# Patient Record
Sex: Male | Born: 1945 | Race: White | Hispanic: No | Marital: Married | Smoking: Never smoker
Health system: Southern US, Community
[De-identification: ages and names within clinical notes are randomized; demographics above are authoritative.]

## PROBLEM LIST (undated history)

## (undated) ENCOUNTER — Emergency Department (HOSPITAL_BASED_OUTPATIENT_CLINIC_OR_DEPARTMENT_OTHER): Admission: EM | Payer: Medicare Other | Source: Home / Self Care

## (undated) DIAGNOSIS — M1990 Unspecified osteoarthritis, unspecified site: Secondary | ICD-10-CM

## (undated) DIAGNOSIS — I219 Acute myocardial infarction, unspecified: Secondary | ICD-10-CM

## (undated) DIAGNOSIS — I2510 Atherosclerotic heart disease of native coronary artery without angina pectoris: Secondary | ICD-10-CM

## (undated) DIAGNOSIS — G40409 Other generalized epilepsy and epileptic syndromes, not intractable, without status epilepticus: Secondary | ICD-10-CM

## (undated) DIAGNOSIS — I4891 Unspecified atrial fibrillation: Secondary | ICD-10-CM

## (undated) DIAGNOSIS — K625 Hemorrhage of anus and rectum: Secondary | ICD-10-CM

## (undated) DIAGNOSIS — C218 Malignant neoplasm of overlapping sites of rectum, anus and anal canal: Secondary | ICD-10-CM

## (undated) DIAGNOSIS — K645 Perianal venous thrombosis: Secondary | ICD-10-CM

## (undated) DIAGNOSIS — N529 Male erectile dysfunction, unspecified: Secondary | ICD-10-CM

## (undated) DIAGNOSIS — I509 Heart failure, unspecified: Secondary | ICD-10-CM

## (undated) DIAGNOSIS — C801 Malignant (primary) neoplasm, unspecified: Secondary | ICD-10-CM

## (undated) DIAGNOSIS — Z86010 Personal history of colonic polyps: Secondary | ICD-10-CM

## (undated) DIAGNOSIS — N200 Calculus of kidney: Secondary | ICD-10-CM

## (undated) DIAGNOSIS — Z87442 Personal history of urinary calculi: Secondary | ICD-10-CM

## (undated) DIAGNOSIS — I82409 Acute embolism and thrombosis of unspecified deep veins of unspecified lower extremity: Secondary | ICD-10-CM

## (undated) DIAGNOSIS — E039 Hypothyroidism, unspecified: Secondary | ICD-10-CM

## (undated) DIAGNOSIS — K6289 Other specified diseases of anus and rectum: Secondary | ICD-10-CM

## (undated) DIAGNOSIS — T7840XA Allergy, unspecified, initial encounter: Secondary | ICD-10-CM

## (undated) DIAGNOSIS — Z951 Presence of aortocoronary bypass graft: Secondary | ICD-10-CM

## (undated) DIAGNOSIS — R011 Cardiac murmur, unspecified: Secondary | ICD-10-CM

## (undated) DIAGNOSIS — I209 Angina pectoris, unspecified: Secondary | ICD-10-CM

## (undated) DIAGNOSIS — G40309 Generalized idiopathic epilepsy and epileptic syndromes, not intractable, without status epilepticus: Secondary | ICD-10-CM

## (undated) DIAGNOSIS — K921 Melena: Secondary | ICD-10-CM

## (undated) DIAGNOSIS — I10 Essential (primary) hypertension: Secondary | ICD-10-CM

## (undated) DIAGNOSIS — R569 Unspecified convulsions: Secondary | ICD-10-CM

## (undated) DIAGNOSIS — E7800 Pure hypercholesterolemia, unspecified: Secondary | ICD-10-CM

## (undated) DIAGNOSIS — R0602 Shortness of breath: Secondary | ICD-10-CM

## (undated) DIAGNOSIS — J189 Pneumonia, unspecified organism: Secondary | ICD-10-CM

## (undated) DIAGNOSIS — C211 Malignant neoplasm of anal canal: Secondary | ICD-10-CM

## (undated) DIAGNOSIS — Z952 Presence of prosthetic heart valve: Secondary | ICD-10-CM

## (undated) DIAGNOSIS — I350 Nonrheumatic aortic (valve) stenosis: Secondary | ICD-10-CM

## (undated) DIAGNOSIS — I2699 Other pulmonary embolism without acute cor pulmonale: Secondary | ICD-10-CM

## (undated) DIAGNOSIS — Z923 Personal history of irradiation: Secondary | ICD-10-CM

## (undated) HISTORY — DX: Hemorrhage of anus and rectum: K62.5

## (undated) HISTORY — DX: Unspecified convulsions: R56.9

## (undated) HISTORY — DX: Personal history of irradiation: Z92.3

## (undated) HISTORY — PX: ANAL FISSURE REPAIR: SHX2312

## (undated) HISTORY — DX: Perianal venous thrombosis: K64.5

## (undated) HISTORY — DX: Nonrheumatic aortic (valve) stenosis: I35.0

## (undated) HISTORY — DX: Malignant (primary) neoplasm, unspecified: C80.1

## (undated) HISTORY — PX: POLYPECTOMY: SHX149

## (undated) HISTORY — DX: Other generalized epilepsy and epileptic syndromes, not intractable, without status epilepticus: G40.409

## (undated) HISTORY — DX: Calculus of kidney: N20.0

## (undated) HISTORY — DX: Other specified diseases of anus and rectum: K62.89

## (undated) HISTORY — DX: Presence of prosthetic heart valve: Z95.2

## (undated) HISTORY — PX: COLONOSCOPY: SHX174

## (undated) HISTORY — DX: Male erectile dysfunction, unspecified: N52.9

## (undated) HISTORY — DX: Heart failure, unspecified: I50.9

## (undated) HISTORY — DX: Unspecified atrial fibrillation: I48.91

## (undated) HISTORY — DX: Other pulmonary embolism without acute cor pulmonale: I26.99

## (undated) HISTORY — DX: Acute myocardial infarction, unspecified: I21.9

## (undated) HISTORY — DX: Allergy, unspecified, initial encounter: T78.40XA

## (undated) HISTORY — DX: Generalized idiopathic epilepsy and epileptic syndromes, not intractable, without status epilepticus: G40.309

## (undated) HISTORY — DX: Melena: K92.1

## (undated) HISTORY — DX: Essential (primary) hypertension: I10

## (undated) HISTORY — DX: Acute embolism and thrombosis of unspecified deep veins of unspecified lower extremity: I82.409

## (undated) HISTORY — DX: Personal history of colonic polyps: Z86.010

## (undated) HISTORY — DX: Presence of aortocoronary bypass graft: Z95.1

## (undated) HISTORY — DX: Malignant neoplasm of anal canal: C21.1

## (undated) HISTORY — DX: Malignant neoplasm of overlapping sites of rectum, anus and anal canal: C21.8

## (undated) HISTORY — DX: Pure hypercholesterolemia, unspecified: E78.00

## (undated) HISTORY — PX: LITHOTRIPSY: SUR834

---

## 1993-09-04 HISTORY — PX: OTHER SURGICAL HISTORY: SHX169

## 1999-01-06 ENCOUNTER — Ambulatory Visit (HOSPITAL_COMMUNITY): Admission: RE | Admit: 1999-01-06 | Discharge: 1999-01-06 | Payer: Self-pay | Admitting: Sports Medicine

## 2002-01-01 ENCOUNTER — Encounter: Payer: Self-pay | Admitting: Sports Medicine

## 2002-01-01 ENCOUNTER — Encounter: Admission: RE | Admit: 2002-01-01 | Discharge: 2002-01-01 | Payer: Self-pay | Admitting: Sports Medicine

## 2008-03-19 ENCOUNTER — Encounter: Admission: RE | Admit: 2008-03-19 | Discharge: 2008-03-19 | Payer: Self-pay | Admitting: Family Medicine

## 2009-10-22 ENCOUNTER — Emergency Department (HOSPITAL_COMMUNITY): Admission: EM | Admit: 2009-10-22 | Discharge: 2009-10-22 | Payer: Self-pay | Admitting: Family Medicine

## 2009-12-31 ENCOUNTER — Ambulatory Visit (HOSPITAL_BASED_OUTPATIENT_CLINIC_OR_DEPARTMENT_OTHER): Admission: RE | Admit: 2009-12-31 | Discharge: 2009-12-31 | Payer: Self-pay | Admitting: Urology

## 2010-09-04 DIAGNOSIS — I219 Acute myocardial infarction, unspecified: Secondary | ICD-10-CM

## 2010-09-04 HISTORY — DX: Acute myocardial infarction, unspecified: I21.9

## 2010-11-22 LAB — POCT HEMOGLOBIN-HEMACUE: Hemoglobin: 15 g/dL (ref 13.0–17.0)

## 2010-11-24 LAB — POCT URINALYSIS DIP (DEVICE)
Bilirubin Urine: NEGATIVE
Glucose, UA: NEGATIVE mg/dL
Ketones, ur: NEGATIVE mg/dL
Nitrite: NEGATIVE
Protein, ur: NEGATIVE mg/dL
Specific Gravity, Urine: 1.025 (ref 1.005–1.030)
Urobilinogen, UA: 0.2 mg/dL (ref 0.0–1.0)
pH: 5 (ref 5.0–8.0)

## 2011-07-11 ENCOUNTER — Other Ambulatory Visit: Payer: Self-pay | Admitting: Family Medicine

## 2011-07-11 ENCOUNTER — Ambulatory Visit
Admission: RE | Admit: 2011-07-11 | Discharge: 2011-07-11 | Disposition: A | Discharge status: Home / Self Care | Payer: BC Managed Care – PPO | Source: Ambulatory Visit | Attending: Family Medicine | Admitting: Family Medicine

## 2011-07-11 DIAGNOSIS — R05 Cough: Secondary | ICD-10-CM

## 2011-07-25 ENCOUNTER — Encounter: Payer: Self-pay | Admitting: Thoracic Surgery (Cardiothoracic Vascular Surgery)

## 2011-07-25 DIAGNOSIS — E7800 Pure hypercholesterolemia, unspecified: Secondary | ICD-10-CM | POA: Insufficient documentation

## 2011-07-25 DIAGNOSIS — N529 Male erectile dysfunction, unspecified: Secondary | ICD-10-CM

## 2011-07-25 DIAGNOSIS — N200 Calculus of kidney: Secondary | ICD-10-CM | POA: Insufficient documentation

## 2011-07-31 ENCOUNTER — Encounter: Payer: Self-pay | Admitting: Thoracic Surgery (Cardiothoracic Vascular Surgery)

## 2011-07-31 ENCOUNTER — Institutional Professional Consult (permissible substitution) (INDEPENDENT_AMBULATORY_CARE_PROVIDER_SITE_OTHER): Payer: BC Managed Care – PPO | Admitting: Thoracic Surgery (Cardiothoracic Vascular Surgery)

## 2011-07-31 VITALS — BP 142/91 | HR 110 | Resp 20 | Ht 71.0 in | Wt 230.0 lb

## 2011-07-31 DIAGNOSIS — I359 Nonrheumatic aortic valve disorder, unspecified: Secondary | ICD-10-CM

## 2011-07-31 DIAGNOSIS — I350 Nonrheumatic aortic (valve) stenosis: Secondary | ICD-10-CM | POA: Insufficient documentation

## 2011-07-31 NOTE — Progress Notes (Signed)
[address]            [place] [address]          [phone]     CARDIOTHORACIC SURGERY CONSULTATION REPORT  PCP is [name], MD Referring Provider is Donato Schultz, MD  Chief Complaint  Patient presents with  . Aortic Stenosis    Referral from Dr Anne Fu for surgical eval on severe aortic stenosis, ECHO 07/13/11    HPI:  Patient is a 65 year old moderately obese male who has been remarkably healthy most of his life. He was told during his youth that he had a heart murmur but he never had any formal medical evaluation. He served in [employer] without any concerns and he is never been noted to have any issues until recently. The patient does admit to decreased energy and fatigue as well as exertional shortness of breath that is gradually developed over the last year. 3 weeks ago he was helping a friend with strenuous labor lifting somewhat. He somewhat suddenly developed discomfort across his chest associated with severe fatigue dizziness and nausea. He sat down quickly but then shortly thereafter completely passed out. He recovered quickly and declined going to the hospital for evaluation of time. Was seen by his primary care physician and an echocardiogram and stress test were performed. The echocardiogram revealed severe aortic stenosis with normal left ventricular systolic function by report. Nuclear stress test was performed demonstrating normal baseline left ventricular function with ejection fraction 53%. There was no evidence for ischemia and the exam was felt to be low risk. The patient has been referred to consider elective aortic valve replacement. Cardiac catheterization has not been performed.  Past Medical History  Diagnosis Date  . Elevated cholesterol   . ED (erectile dysfunction)   . Kidney stones   . Aortic stenosis     Past Surgical History  Procedure Date  . Lithotripsy 2001 AND 2002    DR PETERSON  . C4-6 fusion  1995    DR STERN    History reviewed. No pertinent family history.  Social History History  Substance Use Topics  . Smoking status: Never Smoker   . Smokeless tobacco: Not on file  . Alcohol Use: Yes     RARE    Current Outpatient Prescriptions  Medication Sig Dispense Refill  . amitriptyline (ELAVIL) 25 MG tablet Take 25 mg by mouth at bedtime.        Marland Kitchen atorvastatin (LIPITOR) 20 MG tablet Take 20 mg by mouth daily.        . Azelastine HCl (ASTEPRO) 0.15 % SOLN Place 2 sprays into the nose 2 (two) times daily.        . beta carotene w/minerals (OCUVITE) tablet Take 1 tablet by mouth daily.        . cholecalciferol (VITAMIN D) 1000 UNITS tablet Take 1,000 Units by mouth daily.        . fish oil-omega-3 fatty acids 1000 MG capsule Take 2 g by mouth daily.        . mometasone (NASONEX) 50 MCG/ACT nasal spray Place 2 sprays into the nose daily.        . tadalafil (CIALIS) 20 MG tablet Take 20 mg by mouth daily as needed.        . Vitamins-Lipotropics (LIPO-FLAVONOID PLUS) TABS Take 1 tablet by mouth 3 (three) times daily after meals.  No Known Allergies  Review of Systems:  General:  normal appetite, decreased energy, 35 lb weight gain over 1 yr  Respiratory:  no cough, no wheezing, no hemoptysis, no pain with inspiration or cough, mild exertional shortness of breath   Cardiac:   no chest pain or tightness other than the single episode 3 weeks ago, mild exertional SOB, no resting SOB, no PND, no orthopnea, no LE edema, no palpitations, one syncopal episode, decreased exercise tolerance and increased fatigue  GI:   no difficulty swallowing, no hematochezia, no hematemesis, no melena, no constipation, no diarrhea   GU:   no dysuria, no urgency, no frequency   Musculoskeletal: no arthritis, no arthralgia other than left index finger  Vascular:  no pain suggestive of claudication   Neuro:   no symptoms suggestive of TIA's, no seizures, no headaches, no peripheral neuropathy  except chronic numbness all toes both feet  Endocrine:  Negative   HEENT:  no loose teeth or painful teeth,  no recent vision changes  Psych:   no anxiety, no depression    Physical Exam:   BP 142/91  Pulse 110  Resp 20  Ht 5\' 11"  (1.803 m)  Wt 230 lb (104.327 kg)  BMI 32.08 kg/m2  SpO2 97%  General:    well-appearing  HEENT:  Unremarkable   Neck:   no JVD, no bruits, no adenopathy   Chest:   clear to auscultation, symmetrical breath sounds, no wheezes, no rhonchi   CV:   RRR, grade III/VI systolic murmur   Abdomen:  soft, non-tender, no masses   Extremities:  warm, well-perfused, pulses palpable  Rectal/GU  Deferred  Neuro:   Grossly non-focal and symmetrical throughout  Skin:   Clean and dry, no rashes, no breakdown  Diagnostic Tests:  Report of 2-D echocardiogram performed 07/13/2011 is reviewed. There was felt to be severe aortic stenosis. The peak velocity across the aortic valve measured 3.51 m/s. The peak and mean transvalvular gradients were estimated 49 and 31 mm mercury respectively corresponding to the aortic valve area estimated between 0.71 0.79 cm. The left ventricular outflow tract diameter was measured 2.1 cm. There was moderate calcification of the aortic valve with very limited excursion. The valve is felt to possibly be bicuspid. Left ventricular systolic function was normal with ejection fraction estimated 60-65%. There is moderate concentric left ventricular hypertrophy. There was mild aortic insufficiency. No other significant abnormalities were noted.  Impression:  Severe aortic stenosis with recent syncopal event and gradual development of exertional fatigue and shortness of breath over the past year. I agree that it would make sense to proceed with elective aortic valve replacement. The patient will need cardiac catheterization prior to plans for surgery.  Plan:  I discussed at length with the patient and his wife the indications, risks, and potential  benefits of aortic valve replacement. The rationale for surgical intervention at this time is been discussed in contrast with long-term outcome with medical therapy. We discussed surgical alternatives including whether or not to replace his valve using a mechanical prosthesis or a bioprosthetic tissue valve. All of his questions been addressed. The patient will plan to proceed with left and right heart catheterization in the near future with hopes to schedule surgery in the month of December. We will plan to see him back once cardiac catheterization as been completed.    Salvatore Decent. Cornelius Moras, MD

## 2011-08-02 ENCOUNTER — Other Ambulatory Visit: Payer: Self-pay | Admitting: Cardiology

## 2011-08-02 MED ORDER — SODIUM CHLORIDE 0.9 % IJ SOLN: 3.0000 mL | Freq: Two times a day (BID) | INTRAMUSCULAR | Status: DC

## 2011-08-02 MED ORDER — SODIUM CHLORIDE 0.9 % IV SOLN: INTRAVENOUS | Status: DC

## 2011-08-02 MED ORDER — SODIUM CHLORIDE 0.9 % IJ SOLN: 3.0000 mL | INTRAMUSCULAR | Status: DC | PRN

## 2011-08-02 NOTE — H&P (Signed)
Patient: Kirk [name], Kirk [name] Provider: Donato Schultz, MD  DOB: April 07, 1946 Age: 65 Y Sex: Male Date: 07/19/2011  Phone: [phone]   Address: [address], [place], [id number]  Pcp: ROBERT READE       Subjective:     CC:    1. MS/echo and stress test f/u.        HPI:  General:  65 year old with no prior cardiac history with hyperlipidemia here for evaluation of chest pain/syncope. He was helping a friend load firewood onto a truck and noted substernal moderate in intensity chest burning. This radiated across his chest wall. He felt quite fatigued that he had to stop electively and sit on the ground. Prior to this symptom he began to experience some nausea but he denied any diaphoresis. He states that he sweats all of the time however. No SOB. No palpitations. No prior syncope. No family history of syncope or SCD. Father died 38 of MI. He rode on the back of the truck, back to the house. When he arrived at the house, he reportedly stepped off of the tailgate and fell to the ground. ground. His eyes reportedly rolled back into his head but no shaking was noted. His friend rolled into his side because he was concerned. After a few seconds he woke up and noted that he was incontinent of stool and urine. He remembers hearing his frieds talk during the episode. 911 was called but he declined services. He went inside, clean herself up. Felt well. The chest pain episode that previously occurred lasted about 10 minutes.   Echocardiogram demonstrated severe aortic stenosis.nuclear stress test showed no ischemia, he was short of breath.  1. There is moderate concentric left ventricle hypertrophy.  2. Left ventricular ejection fraction estimated by 2D at 60-65 percent.  3. There were no regional wall motion abnormalities.  4. Mild left atrial enlargement.  5. Moderate calcification of the aortic valve. Cannot exclude bicuspid valve. Limited excursion.  6. Severe aortic valve stenosis. 3.84 m/s  peak velocity, 38 mm of mercury mean gradient, calculated valve area 0.79 cm squared. ( velocity may be slightly underestimated to eccentric outflow jet).  7. Mild aortic valve regurgitation.  8. The aortic root is normal. ( No evidence of aneurysm or coarctation)  9. Analysis of mitral valve inflow, pulmonary vein Doppler and tissue Doppler suggests grade I diastolic dysfunction with elevated left atrial pressure.  On 07/19/11 we discussed aortic valve replacement..        ROS:  The other elements of the review of systems are negative (12 total elements).       Medical History: elevated cholesterol (normal TSH 08/2007), Erectile dysfunction, proc: normal ETT 2003, proc: kidney stones 2001 & 2002 lithotripsy (Dr. Vonita Moss).        Surgical History: see med. hx. , C4-6 fusion Dr. Venetia Maxon. (tingling in fingers since) 1995.        Family History: Father: deceased 25 yrs MI Mother: deceased 38 yrs breast cancer        Social History:  General:  History of smoking  cigarettes: Never smoked no Smoking.  Alcohol: Rare.  no Recreational drug use.  Occupation: Psychologist, sport and exercise .  Marital Status: married.  Seat belt use: yes.        Medications: Cialis 20 MG Tablet 1/2 tablet prn, Amitriptyline HCl 25 MG Tablet 1 tablet at bedtime, Vitamin D 1000 UNIT Tablet 1 tablet Once a day, Atorvastatin Calcium 20 MG Tablet 1 tablet Once  a day, Fish Oil 1000 MG Capsule 1 capsule with a meal Once a day, Multivitamins Tablet as directed , Nasonex 50 MCG/ACT Suspension 2 sprays in each nostril at bedtime, Medication List reviewed and reconciled with the patient       Allergies: Cats.       Objective:     Vitals: Wt 233, Wt change -2 lb, Ht 69, BMI 34.40, Pulse sitting 76, BP sitting 144/78.       Examination:  General Examination:  GENERAL APPEARANCE alert, oriented, NAD, pleasant.  Please see prior consult note for full physical exam.       Assessment:     Assessment:  1. Aortic  valve disorders - 424.1 (Primary)  2. Syncope and collapse - 780.2  3. Hypercholesteremia, pure - 272.0    Plan:     1. Aortic valve disorders  We will go ahead and have him see at his request Dr. Maren Beach for aortic valve replacement. Symptomatic severe aortic stenosis with syncope, shortness of breath. we will schedule a cardiac catheterization once initial TCTS consult has been made.        Immunizations:        Labs:        Preventive:         Follow Up: cath      Provider: Donato Schultz, MD  Patient: Kirk [name], Kirk [name] DOB: [birth year] Date: 07/19/2011

## 2011-08-03 ENCOUNTER — Encounter (HOSPITAL_BASED_OUTPATIENT_CLINIC_OR_DEPARTMENT_OTHER): Payer: Self-pay | Admitting: Cardiology

## 2011-08-03 ENCOUNTER — Encounter (HOSPITAL_BASED_OUTPATIENT_CLINIC_OR_DEPARTMENT_OTHER)
Admission: RE | Disposition: A | Discharge status: Home / Self Care | Payer: Self-pay | Source: Ambulatory Visit | Attending: Cardiology

## 2011-08-03 ENCOUNTER — Inpatient Hospital Stay (HOSPITAL_BASED_OUTPATIENT_CLINIC_OR_DEPARTMENT_OTHER)
Admission: RE | Admit: 2011-08-03 | Discharge: 2011-08-03 | Disposition: A | Discharge status: Home / Self Care | Payer: BC Managed Care – PPO | Source: Ambulatory Visit | Attending: Cardiology | Admitting: Cardiology

## 2011-08-03 DIAGNOSIS — R55 Syncope and collapse: Secondary | ICD-10-CM | POA: Insufficient documentation

## 2011-08-03 DIAGNOSIS — I359 Nonrheumatic aortic valve disorder, unspecified: Secondary | ICD-10-CM | POA: Insufficient documentation

## 2011-08-03 DIAGNOSIS — E785 Hyperlipidemia, unspecified: Secondary | ICD-10-CM | POA: Insufficient documentation

## 2011-08-03 DIAGNOSIS — I350 Nonrheumatic aortic (valve) stenosis: Secondary | ICD-10-CM | POA: Diagnosis present

## 2011-08-03 DIAGNOSIS — I2510 Atherosclerotic heart disease of native coronary artery without angina pectoris: Secondary | ICD-10-CM | POA: Insufficient documentation

## 2011-08-03 DIAGNOSIS — E7800 Pure hypercholesterolemia, unspecified: Secondary | ICD-10-CM | POA: Insufficient documentation

## 2011-08-03 LAB — POCT I-STAT 3, VENOUS BLOOD GAS (G3P V)
Acid-Base Excess: 1 mmol/L (ref 0.0–2.0)
O2 Saturation: 64 %
pCO2, Ven: 45.9 mmHg (ref 45.0–50.0)

## 2011-08-03 LAB — POCT I-STAT 3, ART BLOOD GAS (G3+)
O2 Saturation: 95 %
TCO2: 26 mmol/L (ref 0–100)
pCO2 arterial: 40.2 mmHg (ref 35.0–45.0)
pO2, Arterial: 77 mmHg — ABNORMAL LOW (ref 80.0–100.0)

## 2011-08-03 SURGERY — JV LEFT AND RIGHT HEART CATHETERIZATION WITH CORONARY ANGIOGRAM
Anesthesia: Moderate Sedation

## 2011-08-03 MED ORDER — ACETAMINOPHEN 325 MG PO TABS: 650.0000 mg | ORAL_TABLET | ORAL | Status: DC | PRN

## 2011-08-03 MED ORDER — ONDANSETRON HCL 4 MG/2ML IJ SOLN: 4.0000 mg | Freq: Four times a day (QID) | INTRAMUSCULAR | Status: DC | PRN

## 2011-08-03 MED ORDER — ASPIRIN 81 MG PO CHEW
324.0000 mg | CHEWABLE_TABLET | ORAL | Status: AC
  Administered 2011-08-03: 324 mg via ORAL

## 2011-08-03 MED ORDER — DIAZEPAM 5 MG PO TABS
5.0000 mg | ORAL_TABLET | ORAL | Status: AC
  Administered 2011-08-03: 5 mg via ORAL

## 2011-08-03 MED ORDER — SODIUM CHLORIDE 0.9 % IV SOLN
INTRAVENOUS | Status: DC
  Administered 2011-08-03: 12:00:00 via INTRAVENOUS

## 2011-08-03 NOTE — Op Note (Signed)
PROCEDURE:  Right and left heart catheterization with selective coronary angiography, abdominal aortogram, cardiac outputs, selective oxygen saturations.  INDICATIONS:  65 year old with severe aortic stenosis. Preoperative cardiac catheterization. Syncopal episode. Dyspnea with exertion. Normal EF 60-65%.  The risks, benefits, and details of the procedure were explained to the patient, including stroke, heart attack, death, renal impairment, arterial damage, bleeding.  The patient verbalized understanding and wanted to proceed.  Informed written consent was obtained.  PROCEDURE TECHNIQUE:  After Xylocaine anesthesia and visualization of the femoral head via fluoroscopy, a 8F sheath was placed in the left femoral artery using the modified Seldinger technique.  A 7 French sheath was inserted into the left femoral vein using the modified Seldinger technique. Left coronary angiography was done using a Judkins L5 catheter.  Right coronary angiography was done using a 3DRC right catheter. Multiple views with hand injection of Omnipaque were obtained. Left ventriculography was done using a pigtail catheter in the RAO position with power injection. Right heart catheterization was performed with a balloon tipped Swan-Ganz catheter traversing the right-sided heart structures to the wedge position. Oxygen saturation was drawn in the pulmonary artery as well as femoral artery. Hemodynamics were obtained. Cardiac outputs were obtained utilizing the Fick and thermodilution methods. Following the procedure, sheaths were removed and patient was hemodynamically stable.   CONTRAST:  Total of 75 ml. fluoroscopy time 8.1 minutes  COMPLICATIONS:  None.    HEMODYNAMICS:    Right atrium (RA): 13/10/9 mmHg Right ventricle (RV): 34/6/13 mmHg Pulmonary artery (PA): 32/19/24 mmHg Pulmonary capillary wedge pressure (PCWP): 19/15/15 mmHg  Cardiac output: Fick 4.2 liters/min, Thermodilution 5 liters/min Cardiac index: Fick  1.9, Thermodilution 2.2  PA saturation: 64% FA saturation: 95%   No left ventriculogram performed, we did not cross the aortic valve but attempted. Severe aortic stenosis on echocardiogram.  ANGIOGRAPHIC DATA:    Left main: No angiographically significant disease. Branches into LAD and circumflex artery.  Left anterior descending (LAD): There is one diagonal branch with an ostial stenosis of approximately 50-70% nuclear stress test showed no ischemia. The remainder of the LAD wraps to the apex.  Circumflex artery (CIRC): There are 2 obtuse marginal branches. No significant CAD.  Right coronary artery (RCA): Large vessel giving rise to the posterior descending artery. No angiographically significant disease.  Abdominal aortogram: Renal arteries bilaterally appear patent, distal aorta is nondiseased, no evidence of aortic aneurysm, iliac vessels and common femoral artery appear widely patent with no significant stenosis.  IMPRESSIONS:  1. 50-70% ostial first diagonal stenosis. Nuclear stress test did not show any evidence of ischemia. 2. Normal right-sided heart pressures. 3. Normal cardiac output.  RECOMMENDATION:  Proceed with aortic valve replacement. Findings discussed with patient.

## 2011-08-03 NOTE — Interval H&P Note (Signed)
History and Physical Interval Note:  08/03/2011 12:32 PM  Kirk [name]  has presented today for surgery, with the diagnosis of chest pain  The various methods of treatment have been discussed with the patient and family. After consideration of risks, benefits and other options for treatment, the patient has consented to  Procedure(s): JV LEFT AND RIGHT HEART CATHETERIZATION WITH CORONARY ANGIOGRAM as a surgical intervention .  The patients' history has been reviewed, patient examined, no change in status, stable for surgery.  I have reviewed the patients' chart and labs.  Questions were answered to the patient's satisfaction.     [name]

## 2011-08-03 NOTE — Progress Notes (Signed)
Discharge instructions completed.  Ambulated to bathroom without bleeding from left groin.  IV discontinued.  Discharged to home via wheelchair without difficulty.

## 2011-08-03 NOTE — Progress Notes (Signed)
Bedrest begins @ 1330.

## 2011-08-03 NOTE — H&P (View-Only) (Signed)
  Patient: Kirk [name], Kirk [name] Provider: Jerric Oyen, MD  DOB: 03/27/1946 Age: 65 Y Sex: Male Date: 07/19/2011  Phone: [phone]   Address: [address], [place], [place]  Pcp: ROBERT READE       Subjective:     CC:    1. MS/echo and stress test f/u.        HPI:  General:  65-year-old with no prior cardiac history with hyperlipidemia here for evaluation of chest pain/syncope. He was helping [name] friend load firewood onto [name] truck and noted substernal moderate in intensity chest burning. This radiated across his chest wall. He felt quite fatigued that he had to stop electively and sit on the ground. Prior to this symptom he began to experience some nausea but he denied any diaphoresis. He states that he sweats all of the time however. No SOB. No palpitations. No prior syncope. No family history of syncope or SCD. Father died 62 of MI. He rode on the back of the truck, back to the house. When he arrived at the house, he reportedly stepped off of the tailgate and fell to the ground. ground. His eyes reportedly rolled back into his head but no shaking was noted. His friend rolled into his side because he was concerned. After [name] few seconds he woke up and noted that he was incontinent of stool and urine. He remembers hearing his frieds talk during the episode. 911 was called but he declined services. He went inside, clean herself up. Felt well. The chest pain episode that previously occurred lasted about 10 minutes.   Echocardiogram demonstrated severe aortic stenosis.nuclear stress test showed no ischemia, he was short of breath.  1. There is moderate concentric left ventricle hypertrophy.  2. Left ventricular ejection fraction estimated by 2D at 60-65 percent.  3. There were no regional wall motion abnormalities.  4. Mild left atrial enlargement.  5. Moderate calcification of the aortic valve. Cannot exclude bicuspid valve. Limited excursion.  6. Severe aortic valve stenosis. 3.84 m/s  peak velocity, 38 mm of mercury mean gradient, calculated valve area 0.79 cm squared. ( velocity may be slightly underestimated to eccentric outflow jet).  7. Mild aortic valve regurgitation.  8. The aortic root is normal. ( No evidence of aneurysm or coarctation)  9. Analysis of mitral valve inflow, pulmonary vein Doppler and tissue Doppler suggests grade I diastolic dysfunction with elevated left atrial pressure.  On 07/19/11 we discussed aortic valve replacement..        ROS:  The other elements of the review of systems are negative (12 total elements).       Medical History: elevated cholesterol (normal TSH 08/2007), Erectile dysfunction, proc: normal ETT 2003, proc: kidney stones 2001 & 2002 lithotripsy (Dr. Peterson).        Surgical History: see med. hx. , C4-6 fusion Dr. Stern. (tingling in fingers since) 1995.        Family History: Father: deceased 62 yrs MI Mother: deceased 76 yrs breast cancer        Social History:  General:  History of smoking  cigarettes: Never smoked no Smoking.  Alcohol: Rare.  no Recreational drug use.  Occupation: Indoor house painting .  Marital Status: married.  Seat belt use: yes.        Medications: Cialis 20 MG Tablet 1/2 tablet prn, Amitriptyline HCl 25 MG Tablet 1 tablet at bedtime, Vitamin D 1000 UNIT Tablet 1 tablet Once [name] day, Atorvastatin Calcium 20 MG Tablet 1 tablet Once   [name] day, Fish Oil 1000 MG Capsule 1 capsule with [name] meal Once [name] day, Multivitamins Tablet as directed , Nasonex 50 MCG/ACT Suspension 2 sprays in each nostril at bedtime, Medication List reviewed and reconciled with the patient       Allergies: Cats.       Objective:     Vitals: Wt 233, Wt change -2 lb, Ht 69, BMI 34.40, Pulse sitting 76, BP sitting 144/78.       Examination:  General Examination:  GENERAL APPEARANCE alert, oriented, NAD, pleasant.  Please see prior consult note for full physical exam.       Assessment:     Assessment:  1. Aortic  valve disorders - 424.1 (Primary)  2. Syncope and collapse - 780.2  3. Hypercholesteremia, pure - 272.0    Plan:     1. Aortic valve disorders  We will go ahead and have him see at his request Dr. VanTrigt for aortic valve replacement. Symptomatic severe aortic stenosis with syncope, shortness of breath. we will schedule [name] cardiac catheterization once initial TCTS consult has been made.        Immunizations:        Labs:        Preventive:         Follow Up: cath      Provider: Justyce Yeater, MD  Patient: Kirk [name], Kirk [name] DOB: 05/17/1946 Date: 07/19/2011    

## 2011-08-04 ENCOUNTER — Encounter: Payer: Self-pay | Admitting: *Deleted

## 2011-08-07 ENCOUNTER — Ambulatory Visit (INDEPENDENT_AMBULATORY_CARE_PROVIDER_SITE_OTHER): Payer: BC Managed Care – PPO | Admitting: Thoracic Surgery (Cardiothoracic Vascular Surgery)

## 2011-08-07 ENCOUNTER — Encounter: Payer: Self-pay | Admitting: Thoracic Surgery (Cardiothoracic Vascular Surgery)

## 2011-08-07 VITALS — BP 140/76 | HR 78 | Resp 20 | Ht 71.0 in | Wt 235.0 lb

## 2011-08-07 DIAGNOSIS — I2510 Atherosclerotic heart disease of native coronary artery without angina pectoris: Secondary | ICD-10-CM | POA: Insufficient documentation

## 2011-08-07 DIAGNOSIS — I359 Nonrheumatic aortic valve disorder, unspecified: Secondary | ICD-10-CM | POA: Insufficient documentation

## 2011-08-07 NOTE — Progress Notes (Signed)
[address]            [place] [address]          [phone]     CARDIOTHORACIC SURGERY OFFICE NOTE  Referring Provider is Donato Schultz, MD PCP is [name], MD   HPI:  Patient returns for follow-up of aortic stenosis. He was originally seen in consultation 2 weeks ago and he returns for routine followup today. He underwent left and right heart catheterization by Dr. Anne Fu on 08/03/2011. He was found to have single-vessel coronary artery disease.  He returns for further followup today.  Current Outpatient Prescriptions  Medication Sig Dispense Refill  . amitriptyline (ELAVIL) 25 MG tablet Take 25 mg by mouth at bedtime.        Marland Kitchen atorvastatin (LIPITOR) 20 MG tablet Take 20 mg by mouth daily.        . Azelastine HCl (ASTEPRO) 0.15 % SOLN Place 2 sprays into the nose 2 (two) times daily.        . beta carotene w/minerals (OCUVITE) tablet Take 1 tablet by mouth daily.        . cholecalciferol (VITAMIN D) 1000 UNITS tablet Take 1,000 Units by mouth daily.        . fish oil-omega-3 fatty acids 1000 MG capsule Take 2 g by mouth daily.        . mometasone (NASONEX) 50 MCG/ACT nasal spray Place 2 sprays into the nose daily.        . tadalafil (CIALIS) 20 MG tablet Take 20 mg by mouth daily as needed.        . Vitamins-Lipotropics (LIPO-FLAVONOID PLUS) TABS Take 1 tablet by mouth 3 (three) times daily after meals.            Physical Exam:   BP 140/76  Pulse 78  Resp 20  Ht 5\' 11"  (1.803 m)  Wt 235 lb (106.595 kg)  BMI 32.78 kg/m2  SpO2 97%  Unchanged from previous exam  Diagnostic Tests:  Left and right heart catheterization performed 08/03/2011 is reviewed. This demonstrates single-vessel coronary artery disease involving 70% ostial stenosis of a fairly large first diagonal branch of left anterior descending coronary artery. There was otherwise no significant coronary artery disease and right dominant coronary circulation.  Pulmonary artery pressures measured 32/19 with pulmonary catheter wedge pressure 15. Baseline cardiac output ranged between 4.2 and 5.0 L per minute corresponding to a cardiac index ranging between 1.9 and 2.2.  Impression:  Severe aortic stenosis with single-vessel coronary artery disease.  The patient has recent onset exertional shortness of breath and chest discomfort as well as generalized fatigue.  Plan:  We tentatively plan to proceed with elective aortic valve replacement and single-vessel coronary artery bypass grafting on Wednesday, 08/16/2011. After considerable discussion, the patient specifically requests that his aortic valve be replaced using a bioprosthetic tissue valve. He understands the small but significant risk of late structural valve deterioration in failure depending upon his longevity. He and his wife understand and accept all potential associated risks of surgery including but not limited to risk of death, stroke, myocardial infarction, congestive heart failure, respiratory failure, renal failure, pneumonia, bleeding requiring blood transfusion, arrhythmia, heart block or bradycardia requiring permanent pacemaker, late complications related to valve replacement, late recurrence of coronary artery disease. All of his questions have been addressed.    Salvatore Decent. Cornelius Moras, MD

## 2011-08-08 ENCOUNTER — Other Ambulatory Visit: Payer: Self-pay

## 2011-08-08 ENCOUNTER — Other Ambulatory Visit: Payer: Self-pay | Admitting: Thoracic Surgery (Cardiothoracic Vascular Surgery)

## 2011-08-08 DIAGNOSIS — I2510 Atherosclerotic heart disease of native coronary artery without angina pectoris: Secondary | ICD-10-CM

## 2011-08-08 DIAGNOSIS — I359 Nonrheumatic aortic valve disorder, unspecified: Secondary | ICD-10-CM

## 2011-08-08 DIAGNOSIS — I059 Rheumatic mitral valve disease, unspecified: Secondary | ICD-10-CM

## 2011-08-09 ENCOUNTER — Encounter (HOSPITAL_COMMUNITY): Payer: Self-pay | Admitting: Pharmacy Technician

## 2011-08-11 HISTORY — PX: AORTIC VALVE REPLACEMENT: SHX41

## 2011-08-14 ENCOUNTER — Ambulatory Visit (HOSPITAL_COMMUNITY)
Admission: RE | Admit: 2011-08-14 | Discharge: 2011-08-14 | Disposition: A | Discharge status: Home / Self Care | Payer: BC Managed Care – PPO | Source: Ambulatory Visit | Attending: Thoracic Surgery (Cardiothoracic Vascular Surgery) | Admitting: Thoracic Surgery (Cardiothoracic Vascular Surgery)

## 2011-08-14 ENCOUNTER — Encounter (HOSPITAL_COMMUNITY)
Admission: RE | Admit: 2011-08-14 | Discharge: 2011-08-14 | Disposition: A | Discharge status: Home / Self Care | Payer: BC Managed Care – PPO | Source: Ambulatory Visit | Attending: Thoracic Surgery (Cardiothoracic Vascular Surgery) | Admitting: Thoracic Surgery (Cardiothoracic Vascular Surgery)

## 2011-08-14 ENCOUNTER — Other Ambulatory Visit: Payer: Self-pay

## 2011-08-14 ENCOUNTER — Other Ambulatory Visit (HOSPITAL_COMMUNITY): Payer: Self-pay | Admitting: Radiology

## 2011-08-14 ENCOUNTER — Encounter (HOSPITAL_COMMUNITY): Payer: Self-pay

## 2011-08-14 DIAGNOSIS — I2510 Atherosclerotic heart disease of native coronary artery without angina pectoris: Secondary | ICD-10-CM

## 2011-08-14 DIAGNOSIS — Z01810 Encounter for preprocedural cardiovascular examination: Secondary | ICD-10-CM

## 2011-08-14 DIAGNOSIS — I359 Nonrheumatic aortic valve disorder, unspecified: Secondary | ICD-10-CM

## 2011-08-14 DIAGNOSIS — Z01812 Encounter for preprocedural laboratory examination: Secondary | ICD-10-CM | POA: Insufficient documentation

## 2011-08-14 DIAGNOSIS — Z01818 Encounter for other preprocedural examination: Secondary | ICD-10-CM | POA: Insufficient documentation

## 2011-08-14 HISTORY — DX: Angina pectoris, unspecified: I20.9

## 2011-08-14 HISTORY — DX: Unspecified osteoarthritis, unspecified site: M19.90

## 2011-08-14 HISTORY — DX: Shortness of breath: R06.02

## 2011-08-14 HISTORY — DX: Atherosclerotic heart disease of native coronary artery without angina pectoris: I25.10

## 2011-08-14 HISTORY — DX: Cardiac murmur, unspecified: R01.1

## 2011-08-14 LAB — TYPE AND SCREEN
ABO/RH(D): A POS
Antibody Screen: NEGATIVE

## 2011-08-14 LAB — COMPREHENSIVE METABOLIC PANEL
ALT: 46 U/L (ref 0–53)
AST: 29 U/L (ref 0–37)
Albumin: 3.9 g/dL (ref 3.5–5.2)
CO2: 21 mEq/L (ref 19–32)
Calcium: 9.6 mg/dL (ref 8.4–10.5)
Chloride: 105 mEq/L (ref 96–112)
Creatinine, Ser: 1 mg/dL (ref 0.50–1.35)
GFR calc non Af Amer: 78 mL/min — ABNORMAL LOW (ref >90)
Sodium: 138 mEq/L (ref 135–145)

## 2011-08-14 LAB — CBC
MCV: 90.8 fL (ref 78.0–100.0)
Platelets: 220 10*3/uL (ref 150–400)
RBC: 4.78 MIL/uL (ref 4.22–5.81)
RDW: 12.9 % (ref 11.5–15.5)
WBC: 6.4 10*3/uL (ref 4.0–10.5)

## 2011-08-14 LAB — URINALYSIS, ROUTINE W REFLEX MICROSCOPIC
Bilirubin Urine: NEGATIVE
Hgb urine dipstick: NEGATIVE
Ketones, ur: NEGATIVE mg/dL
Nitrite: NEGATIVE
Protein, ur: NEGATIVE mg/dL
Urobilinogen, UA: 0.2 mg/dL (ref 0.0–1.0)

## 2011-08-14 LAB — BLOOD GAS, ARTERIAL
Drawn by: 344381
Patient temperature: 98.6
TCO2: 24.8 mmol/L (ref 0–100)
pCO2 arterial: 36.7 mmHg (ref 35.0–45.0)
pH, Arterial: 7.426 (ref 7.350–7.450)

## 2011-08-14 LAB — PROTIME-INR: Prothrombin Time: 12.3 seconds (ref 11.6–15.2)

## 2011-08-14 LAB — HEMOGLOBIN A1C: Mean Plasma Glucose: 128 mg/dL — ABNORMAL HIGH (ref <117)

## 2011-08-14 LAB — APTT: aPTT: 30 seconds (ref 24–37)

## 2011-08-14 MED ORDER — ALBUTEROL SULFATE (5 MG/ML) 0.5% IN NEBU
2.5000 mg | INHALATION_SOLUTION | Freq: Once | RESPIRATORY_TRACT | Status: AC
  Administered 2011-08-14: 2.5 mg via RESPIRATORY_TRACT

## 2011-08-14 MED ORDER — CHLORHEXIDINE GLUCONATE 4 % EX LIQD: 30.0000 mL | CUTANEOUS | Status: DC

## 2011-08-14 NOTE — Progress Notes (Signed)
Smiley Houseman 08/14/2011, 3:42 PM  Pre CABG Dopplers completed at 12:25.  Preliminary report is no evidence of significant ICA stenosis.  Vertebral artery flow is antegrade.  ABI is >1.0 bilaterally.

## 2011-08-14 NOTE — Pre-Procedure Instructions (Signed)
20 Kirk [name]  08/14/2011   Your procedure is scheduled on:  08/16/2011, Wednesday  Report to [facility] at 6:30a.m  Call this number if you have problems the morning of surgery: [phone]   Remember:   Do not eat food:After Midnight.  May have clear liquids: up to 4 Hours before arrival.  Clear liquids include soda, tea, black coffee, apple or grape juice, broth.  Take these medicines the morning of surgery with A SIP OF WATER: none   Do not wear jewelry, make-up or nail polish.  Do not wear lotions, powders, or perfumes. You may wear deodorant.  Do not shave 48 hours prior to surgery.  Do not bring valuables to the hospital.  Contacts, dentures or bridgework may not be worn into surgery.  Leave suitcase in the car. After surgery it may be brought to your room.  For patients admitted to the hospital, checkout time is 11:00 AM the day of discharge.   Patients discharged the day of surgery will not be allowed to drive home.  Name and phone number of your driver:   Special Instructions: Incentive Spirometry - Practice and bring it with you on the day of surgery. and CHG Shower Use Special Wash: 1/2 bottle night before surgery and 1/2 bottle morning of surgery.   Please read over the following fact sheets that you were given: Pain Booklet, Coughing and Deep Breathing, Blood Transfusion Information, Open Heart Packet, MRSA Information and Surgical Site Infection Prevention

## 2011-08-14 NOTE — H&P (Signed)
CARDIOTHORACIC SURGERY HISTORY AND PHYSICAL EXAM  PCP is [name], MD Referring Provider is Donato Schultz, MD    Chief Complaint   Patient presents with   .  Aortic Stenosis       Referral from Dr Anne Fu for surgical eval on severe aortic stenosis, ECHO 07/13/11     HPI:  Patient is a 65 year old moderately obese male who has been remarkably healthy most of his life. He was told during his youth that he had a heart murmur but he never had any formal medical evaluation. He served in [employer] without any concerns and he is never been noted to have any issues until recently. The patient does admit to decreased energy and fatigue as well as exertional shortness of breath that is gradually developed over the last year. 3 weeks ago he was helping a friend with strenuous labor lifting somewhat. He somewhat suddenly developed discomfort across his chest associated with severe fatigue dizziness and nausea. He sat down quickly but then shortly thereafter completely passed out. He recovered quickly and declined going to the hospital for evaluation of time. Was seen by his primary care physician and an echocardiogram and stress test were performed. The echocardiogram revealed severe aortic stenosis with normal left ventricular systolic function by report. Nuclear stress test was performed demonstrating normal baseline left ventricular function with ejection fraction 53%. There was no evidence for ischemia and the exam was felt to be low risk. The patient has been referred to consider elective aortic valve replacement. Cardiac catheterization has not been performed.    Past Medical History   Diagnosis  Date   .  Elevated cholesterol     .  ED (erectile dysfunction)     .  Kidney stones     .  Aortic stenosis         Past Surgical History   Procedure  Date   .  Lithotripsy  2001 AND 2002       DR PETERSON   .  C4-6 fusion  1995       DR STERN     History reviewed. No  pertinent family history.  Social History History   Substance Use Topics   .  Smoking status:  Never Smoker    .  Smokeless tobacco:  Not on file   .  Alcohol Use:  Yes         RARE       Current Outpatient Prescriptions   Medication  Sig  Dispense  Refill   .  amitriptyline (ELAVIL) 25 MG tablet  Take 25 mg by mouth at bedtime.           Marland Kitchen  atorvastatin (LIPITOR) 20 MG tablet  Take 20 mg by mouth daily.           .  Azelastine HCl (ASTEPRO) 0.15 % SOLN  Place 2 sprays into the nose 2 (two) times daily.           .  beta carotene w/minerals (OCUVITE) tablet  Take 1 tablet by mouth daily.           .  cholecalciferol (VITAMIN D) 1000 UNITS tablet  Take 1,000 Units by mouth daily.           .  fish oil-omega-3 fatty acids 1000 MG capsule  Take 2 g by mouth daily.           .  mometasone (NASONEX) 50  MCG/ACT nasal spray  Place 2 sprays into the nose daily.           .  tadalafil (CIALIS) 20 MG tablet  Take 20 mg by mouth daily as needed.           .  Vitamins-Lipotropics (LIPO-FLAVONOID PLUS) TABS  Take 1 tablet by mouth 3 (three) times daily after meals.             No Known Allergies  Review of Systems:             General:                      normal appetite, decreased energy, 35 lb weight gain over 1 yr             Respiratory:                no cough, no wheezing, no hemoptysis, no pain with inspiration or cough, mild exertional shortness of breath               Cardiac:                       no chest pain or tightness other than the single episode 3 weeks ago, mild exertional SOB, no resting SOB, no PND, no orthopnea, no LE edema, no palpitations, one syncopal episode, decreased exercise tolerance and increased fatigue             GI:                                no difficulty swallowing, no hematochezia, no hematemesis, no melena, no constipation, no diarrhea               GU:                              no dysuria, no urgency, no frequency               Musculoskeletal:          no arthritis, no arthralgia other than left index finger             Vascular:                     no pain suggestive of claudication               Neuro:                         no symptoms suggestive of TIA's, no seizures, no headaches, no peripheral neuropathy except chronic numbness all toes both feet             Endocrine:                   Negative               HEENT:                       no loose teeth or painful teeth,  no recent vision changes             Psych:  no anxiety, no depression                Physical Exam:              BP 142/91  Pulse 110  Resp 20  Ht 5\' 11"  (1.803 m)  Wt 230 lb (104.327 kg)  BMI 32.08 kg/m2  SpO2 97%             General:                        well-appearing             HEENT:                       Unremarkable               Neck:                           no JVD, no bruits, no adenopathy               Chest:                         clear to auscultation, symmetrical breath sounds, no wheezes, no rhonchi               CV:                              RRR, grade III/VI systolic murmur               Abdomen:                    soft, non-tender, no masses               Extremities:                 warm, well-perfused, pulses palpable             Rectal/GU                   Deferred             Neuro:                         Grossly non-focal and symmetrical throughout             Skin:                            Clean and dry, no rashes, no breakdown  Diagnostic Tests:  Report of 2-D echocardiogram performed 07/13/2011 is reviewed. There was felt to be severe aortic stenosis. The peak velocity across the aortic valve measured 3.51 m/s. The peak and mean transvalvular gradients were estimated 49 and 31 mm mercury respectively corresponding to the aortic valve area estimated between 0.71 0.79 cm. The left ventricular outflow tract diameter was measured 2.1 cm. There was moderate calcification of the aortic valve with very  limited excursion. The valve is felt to possibly be bicuspid. Left ventricular systolic function was normal with ejection fraction estimated 60-65%. There is moderate concentric left ventricular hypertrophy. There was mild aortic insufficiency. No other significant abnormalities were noted.  Left and right heart catheterization performed 08/03/2011 is reviewed. This demonstrates  single-vessel coronary artery disease involving 70% ostial stenosis of a fairly large first diagonal branch of left anterior descending coronary artery. There was otherwise no significant coronary artery disease and right dominant coronary circulation. Pulmonary artery pressures measured 32/19 with pulmonary catheter wedge pressure 15. Baseline cardiac output ranged between 4.2 and 5.0 L per minute corresponding to a cardiac index ranging between 1.9 and 2.2.   Impression:  Severe aortic stenosis with single-vessel coronary artery disease.  The patient has recent onset exertional shortness of breath and chest discomfort as well as generalized fatigue.  Plan:  We tentatively plan to proceed with elective aortic valve replacement and single-vessel coronary artery bypass grafting on Wednesday, 08/16/2011. After considerable discussion, the patient specifically requests that his aortic valve be replaced using a bioprosthetic tissue valve. He understands the small but significant risk of late structural valve deterioration in failure depending upon his longevity. He and his wife understand and accept all potential associated risks of surgery including but not limited to risk of death, stroke, myocardial infarction, congestive heart failure, respiratory failure, renal failure, pneumonia, bleeding requiring blood transfusion, arrhythmia, heart block or bradycardia requiring permanent pacemaker, late complications related to valve replacement, late recurrence of coronary artery disease. All of his questions have been  addressed.    Salvatore Decent. Cornelius Moras, MD

## 2011-08-15 MED ORDER — DEXTROSE 5 % IV SOLN
1.5000 g | INTRAVENOUS | Status: AC
  Administered 2011-08-16: .75 g via INTRAVENOUS
  Administered 2011-08-16: 1.5 g via INTRAVENOUS
  Filled 2011-08-15 (×2): qty 1.5

## 2011-08-15 MED ORDER — METOPROLOL TARTRATE 12.5 MG HALF TABLET
12.5000 mg | ORAL_TABLET | Freq: Once | ORAL | Status: AC
  Administered 2011-08-16: 12.5 mg via ORAL

## 2011-08-15 MED ORDER — SODIUM CHLORIDE 0.9 % IV SOLN
0.1000 ug/kg/h | INTRAVENOUS | Status: AC
  Administered 2011-08-16: .2 ug/kg/h via INTRAVENOUS
  Filled 2011-08-15: qty 4

## 2011-08-15 MED ORDER — DOPAMINE-DEXTROSE 3.2-5 MG/ML-% IV SOLN
2.0000 ug/kg/min | INTRAVENOUS | Status: DC
  Filled 2011-08-15: qty 250

## 2011-08-15 MED ORDER — POTASSIUM CHLORIDE 2 MEQ/ML IV SOLN
80.0000 meq | INTRAVENOUS | Status: DC
  Filled 2011-08-15: qty 40

## 2011-08-15 MED ORDER — DEXTROSE 5 % IV SOLN
750.0000 mg | INTRAVENOUS | Status: DC
  Filled 2011-08-15: qty 750

## 2011-08-15 MED ORDER — SODIUM CHLORIDE 0.9 % IV SOLN
INTRAVENOUS | Status: DC
  Filled 2011-08-15: qty 40

## 2011-08-15 MED ORDER — MAGNESIUM SULFATE 50 % IJ SOLN
40.0000 meq | INTRAMUSCULAR | Status: DC
  Filled 2011-08-15: qty 10

## 2011-08-15 MED ORDER — EPINEPHRINE HCL 1 MG/ML IJ SOLN
0.5000 ug/min | INTRAMUSCULAR | Status: DC
  Filled 2011-08-15: qty 4

## 2011-08-15 MED ORDER — SODIUM CHLORIDE 0.9 % IV SOLN
INTRAVENOUS | Status: AC
  Administered 2011-08-16: 2.5 [IU]/h via INTRAVENOUS
  Filled 2011-08-15: qty 1

## 2011-08-15 MED ORDER — PHENYLEPHRINE HCL 10 MG/ML IJ SOLN
30.0000 ug/min | INTRAVENOUS | Status: AC
  Administered 2011-08-16: 40 ug/min via INTRAVENOUS
  Filled 2011-08-15: qty 2

## 2011-08-15 MED ORDER — NITROGLYCERIN IN D5W 200-5 MCG/ML-% IV SOLN
2.0000 ug/min | INTRAVENOUS | Status: AC
  Administered 2011-08-16: 16.67 ug/min via INTRAVENOUS
  Filled 2011-08-15: qty 250

## 2011-08-15 MED ORDER — SODIUM CHLORIDE 0.9 % IV SOLN
1500.0000 mg | INTRAVENOUS | Status: AC
  Administered 2011-08-16: 1500 mg via INTRAVENOUS
  Filled 2011-08-15: qty 1500

## 2011-08-15 MED ORDER — PLASMA-LYTE 148 IV SOLN
INTRAVENOUS | Status: AC
  Administered 2011-08-16: 10:00:00
  Filled 2011-08-15: qty 0.5

## 2011-08-16 ENCOUNTER — Encounter (HOSPITAL_COMMUNITY): Payer: Self-pay | Admitting: *Deleted

## 2011-08-16 ENCOUNTER — Other Ambulatory Visit: Payer: Self-pay | Admitting: Thoracic Surgery (Cardiothoracic Vascular Surgery)

## 2011-08-16 ENCOUNTER — Inpatient Hospital Stay (HOSPITAL_COMMUNITY)
Admission: RE | Admit: 2011-08-16 | Discharge: 2011-08-23 | DRG: 105 | Disposition: A | Discharge status: Home / Self Care | Payer: BC Managed Care – PPO | Source: Ambulatory Visit | Attending: Thoracic Surgery (Cardiothoracic Vascular Surgery) | Admitting: Thoracic Surgery (Cardiothoracic Vascular Surgery)

## 2011-08-16 ENCOUNTER — Inpatient Hospital Stay (HOSPITAL_COMMUNITY): Payer: BC Managed Care – PPO | Admitting: Certified Registered"

## 2011-08-16 ENCOUNTER — Encounter (HOSPITAL_COMMUNITY)
Admission: RE | Disposition: A | Discharge status: Home / Self Care | Payer: Self-pay | Source: Ambulatory Visit | Attending: Thoracic Surgery (Cardiothoracic Vascular Surgery)

## 2011-08-16 ENCOUNTER — Inpatient Hospital Stay (HOSPITAL_COMMUNITY): Payer: BC Managed Care – PPO

## 2011-08-16 ENCOUNTER — Encounter (HOSPITAL_COMMUNITY): Payer: Self-pay | Admitting: Certified Registered"

## 2011-08-16 DIAGNOSIS — I359 Nonrheumatic aortic valve disorder, unspecified: Principal | ICD-10-CM

## 2011-08-16 DIAGNOSIS — E119 Type 2 diabetes mellitus without complications: Secondary | ICD-10-CM | POA: Diagnosis present

## 2011-08-16 DIAGNOSIS — Z952 Presence of prosthetic heart valve: Secondary | ICD-10-CM

## 2011-08-16 DIAGNOSIS — R059 Cough, unspecified: Secondary | ICD-10-CM | POA: Diagnosis not present

## 2011-08-16 DIAGNOSIS — I4891 Unspecified atrial fibrillation: Secondary | ICD-10-CM | POA: Diagnosis not present

## 2011-08-16 DIAGNOSIS — E669 Obesity, unspecified: Secondary | ICD-10-CM | POA: Diagnosis present

## 2011-08-16 DIAGNOSIS — I10 Essential (primary) hypertension: Secondary | ICD-10-CM | POA: Diagnosis present

## 2011-08-16 DIAGNOSIS — R05 Cough: Secondary | ICD-10-CM | POA: Diagnosis not present

## 2011-08-16 DIAGNOSIS — I2510 Atherosclerotic heart disease of native coronary artery without angina pectoris: Secondary | ICD-10-CM

## 2011-08-16 DIAGNOSIS — F29 Unspecified psychosis not due to a substance or known physiological condition: Secondary | ICD-10-CM | POA: Diagnosis not present

## 2011-08-16 DIAGNOSIS — D62 Acute posthemorrhagic anemia: Secondary | ICD-10-CM | POA: Diagnosis not present

## 2011-08-16 DIAGNOSIS — K5900 Constipation, unspecified: Secondary | ICD-10-CM | POA: Diagnosis present

## 2011-08-16 DIAGNOSIS — D696 Thrombocytopenia, unspecified: Secondary | ICD-10-CM | POA: Diagnosis not present

## 2011-08-16 DIAGNOSIS — Z951 Presence of aortocoronary bypass graft: Secondary | ICD-10-CM

## 2011-08-16 HISTORY — DX: Presence of prosthetic heart valve: Z95.2

## 2011-08-16 HISTORY — DX: Presence of aortocoronary bypass graft: Z95.1

## 2011-08-16 HISTORY — PX: CORONARY ARTERY BYPASS GRAFT: SHX141

## 2011-08-16 LAB — POCT I-STAT 3, ART BLOOD GAS (G3+)
Acid-base deficit: 4 mmol/L — ABNORMAL HIGH (ref 0.0–2.0)
Acid-base deficit: 3 mmol/L — ABNORMAL HIGH (ref 0.0–2.0)
Acid-base deficit: 1 mmol/L (ref 0.0–2.0)
Bicarbonate: 23.6 mEq/L (ref 20.0–24.0)
Bicarbonate: 23.9 mEq/L (ref 20.0–24.0)
Bicarbonate: 22.6 mEq/L (ref 20.0–24.0)
Bicarbonate: 24.4 mEq/L — ABNORMAL HIGH (ref 20.0–24.0)
O2 Saturation: 99 %
Patient temperature: 36.8
Patient temperature: 37
Patient temperature: 37.3
Patient temperature: 37.8
TCO2: 26 mmol/L (ref 0–100)
TCO2: 24 mmol/L (ref 0–100)
TCO2: 26 mmol/L (ref 0–100)
pCO2 arterial: 44.1 mmHg (ref 35.0–45.0)
pH, Arterial: 7.391 (ref 7.350–7.450)
pH, Arterial: 7.26 — ABNORMAL LOW (ref 7.350–7.450)
pO2, Arterial: 149 mmHg — ABNORMAL HIGH (ref 80.0–100.0)
pO2, Arterial: 138 mmHg — ABNORMAL HIGH (ref 80.0–100.0)
pO2, Arterial: 102 mmHg — ABNORMAL HIGH (ref 80.0–100.0)

## 2011-08-16 LAB — POCT I-STAT 4, (NA,K, GLUC, HGB,HCT)
Glucose, Bld: 124 mg/dL — ABNORMAL HIGH (ref 70–99)
Glucose, Bld: 118 mg/dL — ABNORMAL HIGH (ref 70–99)
Glucose, Bld: 133 mg/dL — ABNORMAL HIGH (ref 70–99)
Glucose, Bld: 129 mg/dL — ABNORMAL HIGH (ref 70–99)
Glucose, Bld: 122 mg/dL — ABNORMAL HIGH (ref 70–99)
HCT: 33 % — ABNORMAL LOW (ref 39.0–52.0)
HCT: 33 % — ABNORMAL LOW (ref 39.0–52.0)
HCT: 35 % — ABNORMAL LOW (ref 39.0–52.0)
HCT: 27 % — ABNORMAL LOW (ref 39.0–52.0)
Hemoglobin: 12.6 g/dL — ABNORMAL LOW (ref 13.0–17.0)
Hemoglobin: 11.2 g/dL — ABNORMAL LOW (ref 13.0–17.0)
Potassium: 5.5 mEq/L — ABNORMAL HIGH (ref 3.5–5.1)
Potassium: 5.8 mEq/L — ABNORMAL HIGH (ref 3.5–5.1)
Potassium: 5 mEq/L (ref 3.5–5.1)
Potassium: 4.3 mEq/L (ref 3.5–5.1)
Sodium: 139 mEq/L (ref 135–145)
Sodium: 139 mEq/L (ref 135–145)
Sodium: 137 mEq/L (ref 135–145)
Sodium: 142 mEq/L (ref 135–145)

## 2011-08-16 LAB — POCT I-STAT, CHEM 8
BUN: 14 mg/dL (ref 6–23)
Calcium, Ion: 1.14 mmol/L (ref 1.12–1.32)
Chloride: 107 mEq/L (ref 96–112)
Creatinine, Ser: 1 mg/dL (ref 0.50–1.35)
Sodium: 138 mEq/L (ref 135–145)
TCO2: 22 mmol/L (ref 0–100)

## 2011-08-16 LAB — CBC
MCH: 30.4 pg (ref 26.0–34.0)
MCHC: 33.3 g/dL (ref 30.0–36.0)
MCV: 91.4 fL (ref 78.0–100.0)
MCV: 91.3 fL (ref 78.0–100.0)
Platelets: 107 10*3/uL — ABNORMAL LOW (ref 150–400)
Platelets: 112 10*3/uL — ABNORMAL LOW (ref 150–400)
RBC: 3.72 MIL/uL — ABNORMAL LOW (ref 4.22–5.81)
RBC: 3.81 MIL/uL — ABNORMAL LOW (ref 4.22–5.81)
RDW: 13.1 % (ref 11.5–15.5)
WBC: 11.5 10*3/uL — ABNORMAL HIGH (ref 4.0–10.5)

## 2011-08-16 LAB — GLUCOSE, CAPILLARY
Glucose-Capillary: 112 mg/dL — ABNORMAL HIGH (ref 70–99)
Glucose-Capillary: 128 mg/dL — ABNORMAL HIGH (ref 70–99)
Glucose-Capillary: 104 mg/dL — ABNORMAL HIGH (ref 70–99)
Glucose-Capillary: 101 mg/dL — ABNORMAL HIGH (ref 70–99)
Glucose-Capillary: 112 mg/dL — ABNORMAL HIGH (ref 70–99)

## 2011-08-16 LAB — MAGNESIUM: Magnesium: 3 mg/dL — ABNORMAL HIGH (ref 1.5–2.5)

## 2011-08-16 LAB — APTT: aPTT: 37 seconds (ref 24–37)

## 2011-08-16 LAB — HEMOGLOBIN AND HEMATOCRIT, BLOOD: HCT: 30.6 % — ABNORMAL LOW (ref 39.0–52.0)

## 2011-08-16 LAB — PROTIME-INR
INR: 1.6 — ABNORMAL HIGH (ref 0.00–1.49)
Prothrombin Time: 19.3 seconds — ABNORMAL HIGH (ref 11.6–15.2)

## 2011-08-16 SURGERY — REPLACEMENT, AORTIC VALVE, OPEN
Anesthesia: General | Site: Chest | Wound class: Clean

## 2011-08-16 MED ORDER — MIDAZOLAM HCL 2 MG/2ML IJ SOLN
INTRAMUSCULAR | Status: AC
  Administered 2011-08-16: 2 mg via INTRAVENOUS
  Filled 2011-08-16: qty 2

## 2011-08-16 MED ORDER — METOPROLOL TARTRATE 12.5 MG HALF TABLET
12.5000 mg | ORAL_TABLET | Freq: Two times a day (BID) | ORAL | Status: DC
  Administered 2011-08-17: 12.5 mg via ORAL
  Filled 2011-08-16 (×3): qty 1

## 2011-08-16 MED ORDER — MAGNESIUM SULFATE 50 % IJ SOLN
4.0000 g | Freq: Once | INTRAVENOUS | Status: AC
  Filled 2011-08-16: qty 8

## 2011-08-16 MED ORDER — SODIUM CHLORIDE 0.9 % IV SOLN: 250.0000 mL | INTRAVENOUS | Status: DC

## 2011-08-16 MED ORDER — SODIUM CHLORIDE 0.9 % IV SOLN
10.0000 g | Freq: Once | INTRAVENOUS | Status: DC
  Filled 2011-08-16: qty 40

## 2011-08-16 MED ORDER — SODIUM CHLORIDE 0.9 % IJ SOLN
OROMUCOSAL | Status: DC | PRN
  Administered 2011-08-16 (×3): via TOPICAL

## 2011-08-16 MED ORDER — FENTANYL CITRATE 0.05 MG/ML IJ SOLN
INTRAMUSCULAR | Status: DC | PRN
  Administered 2011-08-16: 100 ug via INTRAVENOUS
  Administered 2011-08-16: 150 ug via INTRAVENOUS
  Administered 2011-08-16: 100 ug via INTRAVENOUS
  Administered 2011-08-16: 1150 ug via INTRAVENOUS
  Administered 2011-08-16: 250 ug via INTRAVENOUS

## 2011-08-16 MED ORDER — SODIUM CHLORIDE 0.9 % IV SOLN
10000.0000 g | INTRAVENOUS | Status: DC
  Filled 2011-08-16: qty 40000

## 2011-08-16 MED ORDER — BISACODYL 10 MG RE SUPP
10.0000 mg | Freq: Every day | RECTAL | Status: DC
  Administered 2011-08-19: 10 mg via RECTAL
  Filled 2011-08-16: qty 1

## 2011-08-16 MED ORDER — SODIUM CHLORIDE 0.9 % IR SOLN
Status: DC | PRN
  Administered 2011-08-16: 5000 mL

## 2011-08-16 MED ORDER — VANCOMYCIN HCL 1000 MG IV SOLR
1000.0000 mg | Freq: Once | INTRAVENOUS | Status: AC
  Administered 2011-08-17: 1000 mg via INTRAVENOUS
  Filled 2011-08-16 (×4): qty 1000

## 2011-08-16 MED ORDER — PHENYLEPHRINE HCL 10 MG/ML IJ SOLN
0.0000 ug/min | INTRAVENOUS | Status: DC
  Filled 2011-08-16: qty 2

## 2011-08-16 MED ORDER — FAMOTIDINE IN NACL 20-0.9 MG/50ML-% IV SOLN
20.0000 mg | Freq: Two times a day (BID) | INTRAVENOUS | Status: AC
  Administered 2011-08-16 (×2): 20 mg via INTRAVENOUS
  Filled 2011-08-16: qty 50

## 2011-08-16 MED ORDER — ALBUMIN HUMAN 5 % IV SOLN: INTRAVENOUS | Status: DC | PRN

## 2011-08-16 MED ORDER — NITROGLYCERIN IN D5W 200-5 MCG/ML-% IV SOLN: 0.0000 ug/min | INTRAVENOUS | Status: DC

## 2011-08-16 MED ORDER — METOPROLOL TARTRATE 25 MG/10 ML ORAL SUSPENSION
12.5000 mg | Freq: Two times a day (BID) | ORAL | Status: DC
  Filled 2011-08-16 (×3): qty 5

## 2011-08-16 MED ORDER — SODIUM CHLORIDE 0.9 % IV SOLN
INTRAVENOUS | Status: DC
  Administered 2011-08-16: 17:00:00 via INTRAVENOUS

## 2011-08-16 MED ORDER — CALCIUM CHLORIDE 10 % IV SOLN
1.0000 g | Freq: Once | INTRAVENOUS | Status: AC | PRN
  Filled 2011-08-16: qty 10

## 2011-08-16 MED ORDER — DEXMEDETOMIDINE HCL 100 MCG/ML IV SOLN
1.0000 ug/kg | Freq: Once | INTRAVENOUS | Status: DC
  Filled 2011-08-16: qty 1.06

## 2011-08-16 MED ORDER — ACETAMINOPHEN 650 MG RE SUPP: 650.0000 mg | RECTAL | Status: AC

## 2011-08-16 MED ORDER — PROTAMINE SULFATE 10 MG/ML IV SOLN
INTRAVENOUS | Status: DC | PRN
  Administered 2011-08-16: 50 mg via INTRAVENOUS
  Administered 2011-08-16 (×2): 300 mg via INTRAVENOUS

## 2011-08-16 MED ORDER — SODIUM CHLORIDE 0.9 % IV SOLN: 0.1000 ug/kg/h | INTRAVENOUS | Status: DC

## 2011-08-16 MED ORDER — DEXTROSE 5 % IV SOLN
1.5000 g | Freq: Two times a day (BID) | INTRAVENOUS | Status: DC
  Administered 2011-08-17 (×3): 1.5 g via INTRAVENOUS
  Filled 2011-08-16 (×6): qty 1.5

## 2011-08-16 MED ORDER — MIDAZOLAM HCL 5 MG/5ML IJ SOLN
INTRAMUSCULAR | Status: DC | PRN
  Administered 2011-08-16 (×3): 2 mg via INTRAVENOUS
  Administered 2011-08-16: 3 mg via INTRAVENOUS
  Administered 2011-08-16: 1 mg via INTRAVENOUS
  Administered 2011-08-16: 2 mg via INTRAVENOUS

## 2011-08-16 MED ORDER — ACETAMINOPHEN 500 MG PO TABS
1000.0000 mg | ORAL_TABLET | Freq: Four times a day (QID) | ORAL | Status: AC
  Administered 2011-08-17 – 2011-08-21 (×17): 1000 mg via ORAL
  Filled 2011-08-16 (×17): qty 2

## 2011-08-16 MED ORDER — MIDAZOLAM HCL 2 MG/2ML IJ SOLN
2.0000 mg | INTRAMUSCULAR | Status: DC | PRN
  Administered 2011-08-16 (×2): 2 mg via INTRAVENOUS

## 2011-08-16 MED ORDER — OXYCODONE HCL 5 MG PO TABS
5.0000 mg | ORAL_TABLET | ORAL | Status: DC | PRN
  Administered 2011-08-17 (×3): 10 mg via ORAL
  Administered 2011-08-17: 5 mg via ORAL
  Administered 2011-08-18: 10 mg via ORAL
  Filled 2011-08-16 (×4): qty 2
  Filled 2011-08-16: qty 1

## 2011-08-16 MED ORDER — ONDANSETRON HCL 4 MG/2ML IJ SOLN: 4.0000 mg | Freq: Four times a day (QID) | INTRAMUSCULAR | Status: DC | PRN

## 2011-08-16 MED ORDER — SODIUM CHLORIDE 0.9 % IJ SOLN: 3.0000 mL | INTRAMUSCULAR | Status: DC | PRN

## 2011-08-16 MED ORDER — SODIUM CHLORIDE 0.45 % IV SOLN
INTRAVENOUS | Status: DC
  Administered 2011-08-16: 20:00:00 via INTRAVENOUS

## 2011-08-16 MED ORDER — ACETAMINOPHEN 160 MG/5ML PO SOLN
975.0000 mg | Freq: Four times a day (QID) | ORAL | Status: AC
  Filled 2011-08-16: qty 40.6

## 2011-08-16 MED ORDER — BISACODYL 5 MG PO TBEC
10.0000 mg | DELAYED_RELEASE_TABLET | Freq: Every day | ORAL | Status: DC
  Administered 2011-08-17 – 2011-08-20 (×4): 10 mg via ORAL
  Filled 2011-08-16 (×5): qty 2

## 2011-08-16 MED ORDER — ALBUMIN HUMAN 5 % IV SOLN
250.0000 mL | INTRAVENOUS | Status: AC | PRN
  Administered 2011-08-16 (×2): 250 mL via INTRAVENOUS

## 2011-08-16 MED ORDER — MORPHINE SULFATE 4 MG/ML IJ SOLN
2.0000 mg | INTRAMUSCULAR | Status: DC | PRN
  Administered 2011-08-17 (×6): 4 mg via INTRAVENOUS
  Filled 2011-08-16 (×7): qty 1

## 2011-08-16 MED ORDER — SODIUM CHLORIDE 0.9 % IV SOLN
10.0000 g | INTRAVENOUS | Status: DC | PRN
  Administered 2011-08-16: 5 g/h via INTRAVENOUS

## 2011-08-16 MED ORDER — PAPAVERINE HCL 30 MG/ML IJ SOLN
INTRAMUSCULAR | Status: DC | PRN
  Administered 2011-08-16: 60 mg via INTRAVENOUS

## 2011-08-16 MED ORDER — ROCURONIUM BROMIDE 100 MG/10ML IV SOLN
INTRAVENOUS | Status: DC | PRN
  Administered 2011-08-16: 50 mg via INTRAVENOUS

## 2011-08-16 MED ORDER — SODIUM CHLORIDE 0.9 % IV SOLN
INTRAVENOUS | Status: DC
  Filled 2011-08-16: qty 1

## 2011-08-16 MED ORDER — POTASSIUM CHLORIDE 10 MEQ/50ML IV SOLN: 10.0000 meq | INTRAVENOUS | Status: AC

## 2011-08-16 MED ORDER — MORPHINE SULFATE 2 MG/ML IJ SOLN
1.0000 mg | INTRAMUSCULAR | Status: AC | PRN
  Administered 2011-08-16: 2 mg via INTRAVENOUS
  Administered 2011-08-17: 4 mg via INTRAVENOUS

## 2011-08-16 MED ORDER — ASPIRIN EC 325 MG PO TBEC
325.0000 mg | DELAYED_RELEASE_TABLET | Freq: Every day | ORAL | Status: DC
  Administered 2011-08-17: 325 mg via ORAL
  Filled 2011-08-16 (×2): qty 1

## 2011-08-16 MED ORDER — ALBUMIN HUMAN 5 % IV SOLN
INTRAVENOUS | Status: DC | PRN
  Administered 2011-08-16: 13:00:00 via INTRAVENOUS

## 2011-08-16 MED ORDER — PROPOFOL 10 MG/ML IV EMUL
INTRAVENOUS | Status: DC | PRN
  Administered 2011-08-16: 75 mg via INTRAVENOUS

## 2011-08-16 MED ORDER — SODIUM CHLORIDE 0.9 % IJ SOLN
OROMUCOSAL | Status: DC | PRN
  Administered 2011-08-16: 15:00:00 via TOPICAL

## 2011-08-16 MED ORDER — AMITRIPTYLINE HCL 25 MG PO TABS
25.0000 mg | ORAL_TABLET | Freq: Every day | ORAL | Status: DC
  Administered 2011-08-18 – 2011-08-22 (×5): 25 mg via ORAL
  Filled 2011-08-16 (×8): qty 1

## 2011-08-16 MED ORDER — SODIUM CHLORIDE 0.9 % IV SOLN
10.0000 g | Freq: Once | INTRAVENOUS | Status: DC
  Filled 2011-08-16 (×2): qty 40

## 2011-08-16 MED ORDER — ASPIRIN 81 MG PO CHEW: 324.0000 mg | CHEWABLE_TABLET | Freq: Every day | ORAL | Status: DC

## 2011-08-16 MED ORDER — LACTATED RINGERS IV SOLN
INTRAVENOUS | Status: DC | PRN
  Administered 2011-08-16 (×3): via INTRAVENOUS

## 2011-08-16 MED ORDER — ACETAMINOPHEN 160 MG/5ML PO SOLN: 650.0000 mg | ORAL | Status: AC

## 2011-08-16 MED ORDER — DOCUSATE SODIUM 100 MG PO CAPS
200.0000 mg | ORAL_CAPSULE | Freq: Every day | ORAL | Status: DC
  Administered 2011-08-17: 200 mg via ORAL
  Filled 2011-08-16: qty 2

## 2011-08-16 MED ORDER — LACTATED RINGERS IV SOLN
INTRAVENOUS | Status: DC
  Administered 2011-08-16: 17:00:00 via INTRAVENOUS

## 2011-08-16 MED ORDER — SODIUM CHLORIDE 0.9 % IJ SOLN
3.0000 mL | Freq: Two times a day (BID) | INTRAMUSCULAR | Status: DC
  Administered 2011-08-17 (×2): 3 mL via INTRAVENOUS

## 2011-08-16 MED ORDER — HEPARIN SODIUM (PORCINE) 1000 UNIT/ML IJ SOLN
INTRAMUSCULAR | Status: DC | PRN
  Administered 2011-08-16: 30000 [IU] via INTRAVENOUS
  Administered 2011-08-16: 35000 [IU] via INTRAVENOUS

## 2011-08-16 MED ORDER — METOPROLOL TARTRATE 1 MG/ML IV SOLN: 2.5000 mg | INTRAVENOUS | Status: DC | PRN

## 2011-08-16 MED ORDER — MAGNESIUM SULFATE 40 MG/ML IJ SOLN
INTRAMUSCULAR | Status: AC
  Administered 2011-08-16: 4 g
  Filled 2011-08-16: qty 100

## 2011-08-16 MED ORDER — SODIUM CHLORIDE 0.9 % IV SOLN
0.4000 ug/kg/h | INTRAVENOUS | Status: DC
  Administered 2011-08-16: 0.7 ug/kg/h via INTRAVENOUS
  Filled 2011-08-16: qty 2

## 2011-08-16 MED ORDER — PANTOPRAZOLE SODIUM 40 MG PO TBEC
40.0000 mg | DELAYED_RELEASE_TABLET | Freq: Every day | ORAL | Status: DC
  Administered 2011-08-18 – 2011-08-22 (×5): 40 mg via ORAL
  Filled 2011-08-16 (×4): qty 1

## 2011-08-16 MED ORDER — MORPHINE SULFATE 2 MG/ML IJ SOLN
INTRAMUSCULAR | Status: AC
  Administered 2011-08-16: 2 mg via INTRAVENOUS
  Filled 2011-08-16: qty 1

## 2011-08-16 MED ORDER — VECURONIUM BROMIDE 10 MG IV SOLR
INTRAVENOUS | Status: DC | PRN
  Administered 2011-08-16: 3 mg via INTRAVENOUS
  Administered 2011-08-16 (×2): 4 mg via INTRAVENOUS
  Administered 2011-08-16 (×4): 3 mg via INTRAVENOUS

## 2011-08-16 SURGICAL SUPPLY — 144 items
ADAPTER CARDIO PERF ANTE/RETRO (ADAPTER) ×2 IMPLANT
ADH SRG 12 PREFL SYR 3 SPRDR (MISCELLANEOUS)
ADPR PRFSN 84XANTGRD RTRGD (ADAPTER) ×1
ANTEGRADE CARDIOPLEGIA CANNULA W/ VENT LINE ×1 IMPLANT
APL SKNCLS STERI-STRIP NONHPOA (GAUZE/BANDAGES/DRESSINGS)
APPLICATOR TIP BIOGLUE STANDRD (MISCELLANEOUS) IMPLANT
ATTRACTOMAT 16X20 MAGNETIC DRP (DRAPES) ×2 IMPLANT
BAG DECANTER FOR FLEXI CONT (MISCELLANEOUS) ×2 IMPLANT
BANDAGE ELASTIC 4 VELCRO ST LF (GAUZE/BANDAGES/DRESSINGS) ×1 IMPLANT
BANDAGE ELASTIC 6 VELCRO ST LF (GAUZE/BANDAGES/DRESSINGS) ×1 IMPLANT
BANDAGE GAUZE ELAST BULKY 4 IN (GAUZE/BANDAGES/DRESSINGS) ×1 IMPLANT
BASKET HEART (ORDER IN 25'S) (MISCELLANEOUS) ×1
BASKET HEART (ORDER IN 25S) (MISCELLANEOUS) ×1 IMPLANT
BENZOIN TINCTURE PRP APPL 2/3 (GAUZE/BANDAGES/DRESSINGS) ×1 IMPLANT
BLADE STERNUM SYSTEM 6 (BLADE) ×2 IMPLANT
BLADE SURG 11 STRL SS (BLADE) ×2 IMPLANT
BLADE SURG ROTATE 9660 (MISCELLANEOUS) IMPLANT
CANISTER SUCTION 2500CC (MISCELLANEOUS) ×2 IMPLANT
CANNULA GUNDRY RCSP 15FR (MISCELLANEOUS) ×2 IMPLANT
CANNULA SOFTFLOW AORTIC 7M21FR (CANNULA) ×1 IMPLANT
CATH CPB KIT OWEN (MISCELLANEOUS) ×2 IMPLANT
CATH HEART VENT LEFT (CATHETERS) ×1 IMPLANT
CATH ROBINSON RED A/P 18FR (CATHETERS) ×6 IMPLANT
CATH THORACIC 28FR (CATHETERS) IMPLANT
CATH THORACIC 28FR RT ANG (CATHETERS) IMPLANT
CATH THORACIC 36FR (CATHETERS) ×2 IMPLANT
CATH THORACIC 36FR RT ANG (CATHETERS) ×2 IMPLANT
CLIP RETRACTION 3.0MM CORONARY (MISCELLANEOUS) ×1 IMPLANT
CLOTH BEACON ORANGE TIMEOUT ST (SAFETY) ×2 IMPLANT
CONT SPEC STER OR (MISCELLANEOUS) ×2 IMPLANT
COVER SURGICAL LIGHT HANDLE (MISCELLANEOUS) ×4 IMPLANT
CRADLE DONUT ADULT HEAD (MISCELLANEOUS) ×2 IMPLANT
DRAIN CHANNEL 32F RND 10.7 FF (WOUND CARE) ×2 IMPLANT
DRAPE CARDIOVASCULAR INCISE (DRAPES) ×2
DRAPE INCISE IOBAN 66X45 STRL (DRAPES) ×3 IMPLANT
DRAPE SLUSH/WARMER DISC (DRAPES) ×2 IMPLANT
DRAPE SRG 135X102X78XABS (DRAPES) ×1 IMPLANT
DRSG COVADERM 4X14 (GAUZE/BANDAGES/DRESSINGS) ×2 IMPLANT
ELECT REM PT RETURN 9FT ADLT (ELECTROSURGICAL) ×4
ELECTRODE REM PT RTRN 9FT ADLT (ELECTROSURGICAL) ×2 IMPLANT
GLOVE BIO SURGEON STRL SZ 6 (GLOVE) ×2 IMPLANT
GLOVE BIO SURGEON STRL SZ 6.5 (GLOVE) IMPLANT
GLOVE BIO SURGEON STRL SZ7 (GLOVE) IMPLANT
GLOVE BIO SURGEON STRL SZ7.5 (GLOVE) IMPLANT
GLOVE BIOGEL PI IND STRL 6 (GLOVE) IMPLANT
GLOVE BIOGEL PI IND STRL 6.5 (GLOVE) IMPLANT
GLOVE BIOGEL PI IND STRL 7.0 (GLOVE) IMPLANT
GLOVE BIOGEL PI INDICATOR 6 (GLOVE)
GLOVE BIOGEL PI INDICATOR 6.5 (GLOVE) ×3
GLOVE BIOGEL PI INDICATOR 7.0 (GLOVE) ×3
GLOVE EUDERMIC 7 POWDERFREE (GLOVE) IMPLANT
GLOVE ORTHO TXT STRL SZ7.5 (GLOVE) ×5 IMPLANT
GLOVE SURG EUDERMIC 8 LTX PF (GLOVE) ×2 IMPLANT
GOWN PREVENTION PLUS XLARGE (GOWN DISPOSABLE) ×1 IMPLANT
GOWN STRL NON-REIN LRG LVL3 (GOWN DISPOSABLE) ×12 IMPLANT
HEMOSTAT POWDER KIT SURGIFOAM (HEMOSTASIS) ×4 IMPLANT
HEMOSTAT POWDER SURGIFOAM 1G (HEMOSTASIS) ×6 IMPLANT
INSERT FOGARTY 61MM (MISCELLANEOUS) IMPLANT
INSERT FOGARTY XLG (MISCELLANEOUS) ×3 IMPLANT
KIT BASIN OR (CUSTOM PROCEDURE TRAY) ×2 IMPLANT
KIT DRAINAGE VACCUM ASSIST (KITS) ×1 IMPLANT
KIT ROOM TURNOVER OR (KITS) ×2 IMPLANT
KIT SUCTION CATH 14FR (SUCTIONS) ×3 IMPLANT
KIT VASOVIEW W/TROCAR VH 2000 (KITS) ×2 IMPLANT
LEAD MYOCARDIAL PACING ×1 IMPLANT
LEAD PACING MYOCARDI (MISCELLANEOUS) ×2 IMPLANT
LEFT HEART VENT ×1 IMPLANT
LINE VENT (MISCELLANEOUS) ×1 IMPLANT
MARKER GRAFT CORONARY BYPASS (MISCELLANEOUS) ×6 IMPLANT
NS IRRIG 1000ML POUR BTL (IV SOLUTION) ×10 IMPLANT
PACK OPEN HEART (CUSTOM PROCEDURE TRAY) ×2 IMPLANT
PAD ARMBOARD 7.5X6 YLW CONV (MISCELLANEOUS) ×4 IMPLANT
PENCIL BUTTON HOLSTER BLD 10FT (ELECTRODE) ×2 IMPLANT
PUNCH AORTIC ROTATE 4.0MM (MISCELLANEOUS) IMPLANT
PUNCH AORTIC ROTATE 4.5MM 8IN (MISCELLANEOUS) IMPLANT
PUNCH AORTIC ROTATE 5MM 8IN (MISCELLANEOUS) IMPLANT
SET CARDIOPLEGIA MPS 5001102 (MISCELLANEOUS) ×1 IMPLANT
SET IRRIG TUBING LAPAROSCOPIC (IRRIGATION / IRRIGATOR) ×2 IMPLANT
SOLUTION ANTI FOG 6CC (MISCELLANEOUS) IMPLANT
SPONGE GAUZE 4X4 12PLY (GAUZE/BANDAGES/DRESSINGS) ×3 IMPLANT
SPONGE INTESTINAL PEANUT (DISPOSABLE) IMPLANT
SPONGE LAP 18X18 X RAY DECT (DISPOSABLE) ×1 IMPLANT
SPONGE LAP 4X18 X RAY DECT (DISPOSABLE) ×1 IMPLANT
STOPCOCK 4 WAY LG BORE MALE ST (IV SETS) IMPLANT
STRIP CLOSURE SKIN 1/2X4 (GAUZE/BANDAGES/DRESSINGS) ×1 IMPLANT
SUT BONE WAX W31G (SUTURE) ×3 IMPLANT
SUT ETHIBON 2 0 V 52N 30 (SUTURE) ×3 IMPLANT
SUT ETHIBON EXCEL 2-0 V-5 (SUTURE) ×2 IMPLANT
SUT ETHIBOND 2 0 SH (SUTURE)
SUT ETHIBOND 2 0 SH 36X2 (SUTURE) IMPLANT
SUT ETHIBOND 2 0 V4 (SUTURE) IMPLANT
SUT ETHIBOND 2 0V4 GREEN (SUTURE) IMPLANT
SUT ETHIBOND 4 0 RB 1 (SUTURE) IMPLANT
SUT ETHIBOND V-5 VALVE (SUTURE) IMPLANT
SUT ETHIBOND X763 2 0 SH 1 (SUTURE) ×9 IMPLANT
SUT MNCRL AB 3-0 PS2 18 (SUTURE) ×4 IMPLANT
SUT MNCRL AB 4-0 PS2 18 (SUTURE) IMPLANT
SUT PDS AB 1 CTX 36 (SUTURE) ×4 IMPLANT
SUT PROLENE 2 0 SH DA (SUTURE) IMPLANT
SUT PROLENE 3 0 SH DA (SUTURE) ×2 IMPLANT
SUT PROLENE 3 0 SH1 36 (SUTURE) ×2 IMPLANT
SUT PROLENE 4 0 RB 1 (SUTURE) ×18
SUT PROLENE 4 0 SH DA (SUTURE) ×1 IMPLANT
SUT PROLENE 4-0 RB1 .5 CRCL 36 (SUTURE) IMPLANT
SUT PROLENE 5 0 C 1 36 (SUTURE) IMPLANT
SUT PROLENE 6 0 C 1 30 (SUTURE) IMPLANT
SUT PROLENE 6 0 CC (SUTURE) IMPLANT
SUT PROLENE 7 0 BV 1 (SUTURE) IMPLANT
SUT PROLENE 7 0 BV1 MDA (SUTURE) IMPLANT
SUT PROLENE 7.0 RB 3 (SUTURE) ×6 IMPLANT
SUT PROLENE 8 0 BV175 6 (SUTURE) IMPLANT
SUT PROLENE BLUE 7 0 (SUTURE) ×2 IMPLANT
SUT PROLENE POLY MONO (SUTURE) IMPLANT
SUT SILK  1 MH (SUTURE) ×2
SUT SILK 1 MH (SUTURE) ×1 IMPLANT
SUT SILK 2 0 SH CR/8 (SUTURE) IMPLANT
SUT SILK 3 0 SH CR/8 (SUTURE) IMPLANT
SUT STEEL 6MS V (SUTURE) ×1 IMPLANT
SUT STEEL STERNAL CCS#1 18IN (SUTURE) ×1 IMPLANT
SUT STEEL SZ 6 DBL 3X14 BALL (SUTURE) ×3 IMPLANT
SUT VIC AB 1 CTX 36 (SUTURE)
SUT VIC AB 1 CTX36XBRD ANBCTR (SUTURE) IMPLANT
SUT VIC AB 2-0 CT1 27 (SUTURE)
SUT VIC AB 2-0 CT1 TAPERPNT 27 (SUTURE) IMPLANT
SUT VIC AB 2-0 CTX 27 (SUTURE) ×1 IMPLANT
SUT VIC AB 3-0 SH 27 (SUTURE)
SUT VIC AB 3-0 SH 27X BRD (SUTURE) IMPLANT
SUT VIC AB 3-0 X1 27 (SUTURE) IMPLANT
SUT VICRYL 4-0 PS2 18IN ABS (SUTURE) IMPLANT
SUTURE E-PAK OPEN HEART (SUTURE) ×2 IMPLANT
SYR 10ML KIT SKIN ADHESIVE (MISCELLANEOUS) IMPLANT
SYSTEM SAHARA CHEST DRAIN ATS (WOUND CARE) ×2 IMPLANT
TAPE CLOTH SURG 4X10 WHT LF (GAUZE/BANDAGES/DRESSINGS) ×1 IMPLANT
TOWEL OR 17X24 6PK STRL BLUE (TOWEL DISPOSABLE) ×3 IMPLANT
TOWEL OR 17X26 10 PK STRL BLUE (TOWEL DISPOSABLE) ×4 IMPLANT
TRAY FOLEY IC TEMP SENS 14FR (CATHETERS) ×2 IMPLANT
TUBE FEEDING 8FR 16IN STR KANG (MISCELLANEOUS) ×1 IMPLANT
TUBE SUCT INTRACARD DLP 20F (MISCELLANEOUS) ×2 IMPLANT
TUBING INSUFFLATION 10FT LAP (TUBING) ×1 IMPLANT
UNDERPAD 30X30 INCONTINENT (UNDERPADS AND DIAPERS) ×2 IMPLANT
VALVE MAGNA EASE AORTIC 23MM (Prosthesis & Implant Heart) ×1 IMPLANT
VENT LEFT HEART 12002 (CATHETERS) ×2
WATER STERILE IRR 1000ML POUR (IV SOLUTION) ×4 IMPLANT
YANKAUER SUCT BULB TIP NO VENT (SUCTIONS) ×1 IMPLANT

## 2011-08-16 NOTE — Brief Op Note (Signed)
08/16/2011  3:07 PM  PATIENT:  Rudy Jew [employer]  65 y.o. male  PRE-OPERATIVE DIAGNOSIS:  AS, CAD  POST-OPERATIVE DIAGNOSIS:  Aortic valve stenosis, Coronary artery disease  PROCEDURE:   AORTIC VALVE REPLACEMENT (AVR) CORONARY ARTERY BYPASS GRAFTING (CABG)  SURGEON:    Purcell Nails, MD  ASSISTANTS:  Al Corpus, CSFA  ANESTHESIA:   Aubery Lapping, MD  CROSSCLAMP TIME:   116 CARDIOPULMONARY BYPASS TIME: 156  FINDINGS:  Bicuspid native aortic valve with severe aortic stenosis, moderate aortic insufficiency  Normal LV systolic function  Mild mitral regurgitation  COMPLICATIONS: none  PATIENT DISPOSITION:   TO SICU IN STABLE CONDITION  Dallyn Bergland H 08/16/2011 3:07 PM

## 2011-08-16 NOTE — Preoperative (Signed)
Beta Blockers   Reason not to administer Beta Blockers:Not Applicable 

## 2011-08-16 NOTE — Progress Notes (Signed)
Inc Vt and RR per MD. MD aware of Pip 45

## 2011-08-16 NOTE — Progress Notes (Signed)
Pt. Placed on cpap ps. Pt. Tolerating well at this time.

## 2011-08-16 NOTE — Progress Notes (Signed)
Requested PFT to be faxed to short stay A

## 2011-08-16 NOTE — Anesthesia Postprocedure Evaluation (Signed)
  Anesthesia Post-op Note  Patient: Kirk [name]  Procedure(s) Performed:  AORTIC VALVE REPLACEMENT (AVR); CORONARY ARTERY BYPASS GRAFTING (CABG) - CABG times one using left internal mammary artery  Patient Location: SICU  Anesthesia Type: General  Level of Consciousness: sedated and Patient remains intubated per anesthesia plan  Airway and Oxygen Therapy: Patient remains intubated per anesthesia plan  Post-op Pain: none  Post-op Assessment: Post-op Vital signs reviewed  Post-op Vital Signs: Reviewed and stable  Complications: No apparent anesthesia complications

## 2011-08-16 NOTE — Plan of Care (Signed)
Problem: Phase I Progression Outcomes Goal: Point person for discharge identified Outcome: Completed/Met Date Met:  08/16/11 Patient's wife, [name] at [phone]

## 2011-08-16 NOTE — Anesthesia Procedure Notes (Signed)
Procedures

## 2011-08-16 NOTE — Interval H&P Note (Signed)
History and Physical Interval Note:  08/16/2011 8:15 AM  Kirk [name]  has presented today for surgery, with the diagnosis of AS, CAD  The various methods of treatment have been discussed with the patient and family. After consideration of risks, benefits and other options for treatment, the patient has consented to  Procedure(s): AORTIC VALVE REPLACEMENT (AVR) CORONARY ARTERY BYPASS GRAFTING (CABG) as a surgical intervention .  The patients' history has been reviewed, patient examined, no change in status, stable for surgery.  I have reviewed the patients' chart and labs.  Questions were answered to the patient's satisfaction.     OWEN,CLARENCE H 08/16/2011 8:15 AM

## 2011-08-16 NOTE — Progress Notes (Signed)
Echocardiogram 2D Echocardiogram has been performed.  Kirk [name] 08/16/2011, 10:39 AM

## 2011-08-16 NOTE — Op Note (Signed)
CARDIOTHORACIC SURGERY OPERATIVE NOTE  Date of Procedure: 08/16/2011  Preoperative Diagnosis:   Severe Aortic Stenosis  Severe Single-vessel Coronary Artery Disease  Postoperative Diagnosis: Same  Procedure:    Aortic Valve Replacement   25mm Edwards Magna Ease Pericardial Tissue Valve   Coronary Artery Bypass Grafting x 1   Left Internal Mammary Artery to First Diagonal Branch off of Left Anterior Descending Coronary Artery  Surgeon: Salvatore Decent. Cornelius Moras, MD  Assistant: Al Corpus, CSFA  Anesthesia: Arta Bruce, MD  Operative Findings:  Bicuspid native aortic valve  Severe aortic stenosis  Moderate aortic insufficiency  Mild mitral regurgitation  Normal left ventricular systolic function  Good quality left internal mammary artery conduit  Good quality target vessel for grafting   BRIEF CLINICAL NOTE AND INDICATIONS FOR SURGERY  Patient is a 65 year old moderately obese male who has been remarkably healthy most of his life. He was told during his youth that he had a heart murmur but he never had any formal medical evaluation. He served in [employer] without any concerns and he is never been noted to have any issues until recently. The patient does admit to decreased energy and fatigue as well as exertional shortness of breath that is gradually developed over the last year. 3 weeks ago he was helping a friend with strenuous labor lifting somewhat. He somewhat suddenly developed discomfort across his chest associated with severe fatigue dizziness and nausea. He sat down quickly but then shortly thereafter completely passed out. He recovered quickly and declined going to the hospital for evaluation of time. Was seen by his primary care physician and an echocardiogram and stress test were performed. The echocardiogram revealed severe aortic stenosis with normal left ventricular systolic function by report. Nuclear stress test was performed demonstrating normal baseline left  ventricular function with ejection fraction 53%. There was no evidence for ischemia and the exam was felt to be low risk. The patient has been referred to consider elective aortic valve replacement. Cardiac catheterization reveals single vessel coronary artery disease with high grade stenosis of a large diagonal branch off of the left anterior descending coronary artery.   DETAILS OF THE OPERATIVE PROCEDURE  The patient is brought to the operating room on the above mentioned date and central monitoring was established by the anesthesia team including placement of Swan-Ganz catheter and radial arterial line. The patient is placed in the supine position on the operating table.  Intravenous antibiotics are administered. General endotracheal anesthesia is induced uneventfully. A Foley catheter is placed.  Baseline transesophageal echocardiogram was performed.  Findings were notable for bicuspid native aortic valve with severe aortic stenosis and moderate aortic insufficiency.  There is mild mitral regurgitation and normal LV systolic function.  The patient's chest, abdomen, both groins, and both lower extremities are prepared and draped in a sterile manner. A time out procedure is performed.  A median sternotomy incision was performed and the left internal mammary artery is dissected from the chest wall and prepared for bypass grafting. The left internal mammary artery is notably good quality conduit. Following systemic heparinization, the left internal mammary artery was transected distally noted to have excellent flow.  The pericardium is opened. The ascending aorta is normal in appearance. The ascending aorta and the right atrium are cannulated for cardioplegia bypass.  Adequate heparinization is verified.    A retrograde cardioplegia cannula is placed through the right atrium into the coronary sinus.   The entire pre-bypass portion of the operation was notable for stable  hemodynamics.  Cardiopulmonary bypass was begun and a left ventricular vent placed through the right superior pulmonary vein.  The surface of the heart inspected. Distal target vessels are selected for coronary artery bypass grafting. A cardioplegia cannula is placed in the ascending aorta.  A temperature probe was placed in the interventricular septum.  The patient is cooled to 32C systemic temperature.  The aortic cross clamp is applied and cold blood cardioplegia is delivered initially in an antegrade fashion through the aortic root.   Supplemental cardioplegia is given retrograde through the coronary sinus catheter.  Iced saline slush is applied for topical hypothermia.  The initial cardioplegic arrest is rapid with early diastolic arrest.  Repeat doses of cardioplegia are administered intermittently throughout the entire cross clamp portion of the operation through the aortic root,  through the coronary sinus catheter, and through subsequently placed vein grafts in order to maintain completely flat electrocardiogram and septal myocardial temperature below 15C.  Myocardial protection was felt to be  excellent.  The following distal coronary artery bypass grafts were performed:   The first diagonal branch off of the left anterior coronary artery was grafted with the left internal mammary artery in an end-to-side fashion.  At the site of distal anastomosis the target vessel was good quality and measured approximately 1.5 mm in diameter.   An oblique transverse aortotomy incision was performed.  The aortic valve was inspected and notably bicuspid with a fused raphe between the left and right leaflets.  There was severe aortic stenosis.  The aortic valve leaflets were excised sharply and the aortic annulus decalcified.  Decalcification was extensive.  The aortic annulus was sized to accept a 25 mm prosthesis.  The aortic root and left ventricle were irrigated with copious cold saline  solution.  Aortic valve replacement was performed using interrupted horizontal mattress 2-0 Ethibond pledgeted sutures with pledgets in the subannular position.  An [facility] Ease pericardial tissue valve (size 25 mm, model # 3300TFX, serial # [id number]) was implanted uneventfully. The valve seated appropriately with adequate space beneath the left main and right coronary artery.  The aortotomy was closed using a 2-layer closure of running 4-0 Prolene suture.  All proximal vein graft anastomoses were placed directly to the ascending aorta prior to removal of the aortic cross clamp.  The septal myocardial temperature rose rapidly after reperfusion of the left internal mammary artery graft.  One final dose of warm retrograde "hot shot" cardioplegia was administered through the coronary sinus catheter while all air was evacuated through the aortic root.  The aortic cross clamp was removed after a total cross clamp time of 84 minutes.  The distal coronary anastomoses were inspected for hemostasis and appropriate graft orientation. Epicardial pacing wires are fixed to the right ventricular outflow tract and to the right atrial appendage. The patient is rewarmed to 37C temperature. The aortic and left ventricular vents were removed.  The patient is weaned and disconnected from cardiopulmonary bypass.  The patient's rhythm at separation from bypass was sinus.  The patient was weaned from cardioplegic bypass  without any inotropic support. Total cardiopulmonary bypass time for the operation was 111 minutes.  Followup transesophageal echocardiogram performed after separation from bypass revealed normal LV systolic function.  There appeared to be a small perivalvular leak with mild aortic insufficiency.  The patient was heparinized and recannulated.  Cardiopulmonary bypass was resumed.  The aortic crossclamp was placed and cold blood cardioplegia administered both antegrade and retrograde.  A bulldog clamp was  placed on the LIMA pedicle.  The aortotomy was reopened and the aortic valve inspected.  The leaflets were normal and the valve stent appeared normal.  Using a coronary probe a small defect was discovered between the sewing ring and the annulus beneath the non-coronary sinus of Valsalva.  It appeared that one of the horizontal mattress sutures had pulled through the annulus where the annulus had been decalcified.  Two additional horizontal mattress pledgeted sutures were placed.  Rewarming was resumed and the aortotomy closed.  The aortic crossclamp was removed after an additional crossclamp time of 32 minutes, bringing the total to 116 minutes.  The patient was rewarmed and again weaned from bypass uneventfully.  The added bypass duration was 45 minutes bringing the total to 156 minutes.  Follow up transesophageal echocardiogram revealed a well seated bioprosthetic tissue valve in the aortic position.  There was no aortic insufficiency.  There was normal LV systolic function.  The aortic and venous cannula were removed uneventfully. Protamine was administered to reverse the anticoagulation. The mediastinum and pleural space were inspected for hemostasis and irrigated with saline solution. The mediastinum and the left pleural space were drained using 3 chest tubes placed through separate stab incisions inferiorly.  The soft tissues anterior to the aorta were reapproximated loosely. The sternum is closed with double strength sternal wire. The soft tissues anterior to the sternum were closed in multiple layers and the skin is closed with a running subcuticular skin closure.   The post-bypass portion of the operation was notable for stable rhythm and hemodynamics.   No blood products were administered during the operation.  The patient tolerated the procedure well and is transported to the surgical intensive care in stable condition. There are no intraoperative complications. All sponge instrument and needle  counts are verified correct at completion of the operation.   Salvatore Decent. Cornelius Moras MD

## 2011-08-16 NOTE — OR Nursing (Signed)
Second call to SICU at 1500

## 2011-08-16 NOTE — Transfer of Care (Signed)
Immediate Anesthesia Transfer of Care Note  Patient: Kirk [name]  Procedure(s) Performed:  AORTIC VALVE REPLACEMENT (AVR); CORONARY ARTERY BYPASS GRAFTING (CABG) - CABG times one using left internal mammary artery  Patient Location: SICU  Anesthesia Type: General  Level of Consciousness: sedated and Patient remains intubated per anesthesia plan  Airway & Oxygen Therapy: Patient remains intubated per anesthesia plan  Post-op Assessment: Report given to PACU RN  Post vital signs: Reviewed and stable  Complications: No apparent anesthesia complications

## 2011-08-16 NOTE — Progress Notes (Signed)
Patient ID: Kirk [name], male   DOB: [birth year], 65 y.o.   MRN: [id number]                   [address]            [place] [address]          [phone]    Early post op Not bleeding  Starting to wake up still on vent  Delight Ovens MD  Beeper [phone] Office [phone]

## 2011-08-16 NOTE — Anesthesia Preprocedure Evaluation (Addendum)
Anesthesia Evaluation  Patient identified by MRN, date of birth, ID band Patient awake    Reviewed: Allergy & Precautions, H&P , NPO status , Patient's Chart, lab work & pertinent test results  Airway Mallampati: III TM Distance: <3 FB Neck ROM: Full    Dental  (+) Teeth Intact and Dental Advisory Given   Pulmonary shortness of breath,          Cardiovascular + angina + CAD + Valvular Problems/Murmurs     Neuro/Psych PSYCHIATRIC DISORDERS    GI/Hepatic GERD-  ,  Endo/Other    Renal/GU      Musculoskeletal   Abdominal   Peds  Hematology   Anesthesia Other Findings   Reproductive/Obstetrics                           Anesthesia Physical Anesthesia Plan  ASA: III  Anesthesia Plan: General   Post-op Pain Management:    Induction: Intravenous  Airway Management Planned: Oral ETT  Additional Equipment: Arterial line, CVP, PA Cath and TEE  Intra-op Plan:   Post-operative Plan: Post-operative intubation/ventilation  Informed Consent: I have reviewed the patients History and Physical, chart, labs and discussed the procedure including the risks, benefits and alternatives for the proposed anesthesia with the patient or authorized representative who has indicated his/her understanding and acceptance.     Plan Discussed with: CRNA and Surgeon  Anesthesia Plan Comments:        Anesthesia Quick Evaluation

## 2011-08-16 NOTE — Progress Notes (Signed)
Pt. Wean started. Rate changed to 4. Pt. Tolerating well at this time.

## 2011-08-16 NOTE — Procedures (Signed)
Extubation Procedure Note  Patient Details:   Name: SYLVAN SOOKDEO DOB: 1946/06/11 MRN: [id number]   Airway Documentation:  AIRWAYS 8.5 mm (Active)  Secured at (cm) 23 cm 08/16/2011 12:00 AM     Airway 8.5 mm (Active)  Secured at (cm) 23 cm 08/16/2011  8:33 PM  Measured From Lips 08/16/2011  8:33 PM  Secured Location Right 08/16/2011  8:33 PM  Secured By Pink Tape 08/16/2011  8:33 PM  Site Condition Dry 08/16/2011  3:40 PM    Evaluation  O2 sats: stable throughout Complications: No apparent complications Patient did tolerate procedure well. Bilateral Breath Sounds: Diminished Suctioning: Airway Yes  Adela Ports 08/16/2011, 9:46 PM   Extubated pt to 4L nasal cannula. Pt. Remained hemodynamically stable throughout the procedure. Pt. Performed -40 on NIF and .9 on the VC. Pt. Also performed 750x 8 on the IS. Pt. Has a positive cuff leak. Pt. Is currently tolerating nasal cannula well. RT will continue to monitor.

## 2011-08-17 ENCOUNTER — Encounter (HOSPITAL_COMMUNITY): Payer: Self-pay | Admitting: Thoracic Surgery (Cardiothoracic Vascular Surgery)

## 2011-08-17 ENCOUNTER — Inpatient Hospital Stay (HOSPITAL_COMMUNITY): Payer: BC Managed Care – PPO

## 2011-08-17 LAB — POCT I-STAT 3, ART BLOOD GAS (G3+)
Acid-base deficit: 1 mmol/L (ref 0.0–2.0)
Bicarbonate: 24.1 mEq/L — ABNORMAL HIGH (ref 20.0–24.0)
O2 Saturation: 94 %
Patient temperature: 37.5
TCO2: 25 mmol/L (ref 0–100)
pO2, Arterial: 74 mmHg — ABNORMAL LOW (ref 80.0–100.0)

## 2011-08-17 LAB — POCT I-STAT, CHEM 8
BUN: 20 mg/dL (ref 6–23)
Chloride: 101 mEq/L (ref 96–112)
Creatinine, Ser: 1.3 mg/dL (ref 0.50–1.35)
Potassium: 4.7 mEq/L (ref 3.5–5.1)
Sodium: 138 mEq/L (ref 135–145)
TCO2: 24 mmol/L (ref 0–100)

## 2011-08-17 LAB — CBC
HCT: 34.8 % — ABNORMAL LOW (ref 39.0–52.0)
HCT: 37.8 % — ABNORMAL LOW (ref 39.0–52.0)
Hemoglobin: 11.4 g/dL — ABNORMAL LOW (ref 13.0–17.0)
MCH: 30.3 pg (ref 26.0–34.0)
MCHC: 32.8 g/dL (ref 30.0–36.0)
MCHC: 33.1 g/dL (ref 30.0–36.0)
MCV: 92.6 fL (ref 78.0–100.0)
MCV: 93.3 fL (ref 78.0–100.0)
Platelets: 113 10*3/uL — ABNORMAL LOW (ref 150–400)
Platelets: 109 10*3/uL — ABNORMAL LOW (ref 150–400)
RBC: 3.76 MIL/uL — ABNORMAL LOW (ref 4.22–5.81)
RDW: 13.3 % (ref 11.5–15.5)
RDW: 13.5 % (ref 11.5–15.5)
WBC: 11.9 10*3/uL — ABNORMAL HIGH (ref 4.0–10.5)
WBC: 14.4 10*3/uL — ABNORMAL HIGH (ref 4.0–10.5)

## 2011-08-17 LAB — BASIC METABOLIC PANEL
BUN: 14 mg/dL (ref 6–23)
CO2: 24 mEq/L (ref 19–32)
Calcium: 7.7 mg/dL — ABNORMAL LOW (ref 8.4–10.5)
GFR calc non Af Amer: 88 mL/min — ABNORMAL LOW (ref >90)
Glucose, Bld: 110 mg/dL — ABNORMAL HIGH (ref 70–99)

## 2011-08-17 LAB — GLUCOSE, CAPILLARY
Glucose-Capillary: 118 mg/dL — ABNORMAL HIGH (ref 70–99)
Glucose-Capillary: 107 mg/dL — ABNORMAL HIGH (ref 70–99)
Glucose-Capillary: 103 mg/dL — ABNORMAL HIGH (ref 70–99)
Glucose-Capillary: 103 mg/dL — ABNORMAL HIGH (ref 70–99)
Glucose-Capillary: 99 mg/dL (ref 70–99)

## 2011-08-17 LAB — MAGNESIUM: Magnesium: 2.5 mg/dL (ref 1.5–2.5)

## 2011-08-17 MED ORDER — METOPROLOL TARTRATE 25 MG PO TABS
25.0000 mg | ORAL_TABLET | Freq: Two times a day (BID) | ORAL | Status: DC
  Administered 2011-08-18 (×2): 25 mg via ORAL
  Filled 2011-08-17 (×6): qty 1

## 2011-08-17 MED ORDER — DEXTROSE 5 % IV SOLN
60.0000 mg/h | INTRAVENOUS | Status: AC
  Administered 2011-08-17: 60 mg/h via INTRAVENOUS
  Filled 2011-08-17 (×2): qty 9

## 2011-08-17 MED ORDER — METOPROLOL TARTRATE 25 MG/10 ML ORAL SUSPENSION
12.5000 mg | Freq: Two times a day (BID) | ORAL | Status: DC
  Filled 2011-08-17: qty 5

## 2011-08-17 MED ORDER — AMIODARONE LOAD VIA INFUSION
150.0000 mg | Freq: Once | INTRAVENOUS | Status: AC
  Administered 2011-08-17: 150 mg via INTRAVENOUS
  Filled 2011-08-17: qty 151.2

## 2011-08-17 MED ORDER — INSULIN GLARGINE 100 UNIT/ML ~~LOC~~ SOLN
20.0000 [IU] | SUBCUTANEOUS | Status: DC
  Administered 2011-08-17 – 2011-08-18 (×2): 20 [IU] via SUBCUTANEOUS
  Filled 2011-08-17: qty 3

## 2011-08-17 MED ORDER — DEXTROSE 5 % IV SOLN
150.0000 mg | Freq: Once | INTRAVENOUS | Status: DC
  Filled 2011-08-17: qty 3

## 2011-08-17 MED ORDER — INSULIN ASPART 100 UNIT/ML ~~LOC~~ SOLN
0.0000 [IU] | SUBCUTANEOUS | Status: DC
  Administered 2011-08-17 – 2011-08-18 (×4): 2 [IU] via SUBCUTANEOUS
  Filled 2011-08-17: qty 3

## 2011-08-17 MED ORDER — FUROSEMIDE 10 MG/ML IJ SOLN
20.0000 mg | Freq: Four times a day (QID) | INTRAMUSCULAR | Status: AC
  Administered 2011-08-17 – 2011-08-18 (×3): 20 mg via INTRAVENOUS
  Filled 2011-08-17 (×4): qty 2

## 2011-08-17 MED ORDER — DEXTROSE 5 % IV SOLN
30.0000 mg/h | INTRAVENOUS | Status: DC
  Filled 2011-08-17: qty 9

## 2011-08-17 MED FILL — Metoprolol Tartrate Tab 25 MG: ORAL | Qty: 1 | Status: AC

## 2011-08-17 NOTE — Progress Notes (Signed)
1 Day Post-Op Procedure(s) (LRB): AORTIC VALVE REPLACEMENT (AVR) (N/A) CORONARY ARTERY BYPASS GRAFTING (CABG) (N/A) Subjective: Now rapid a fib  Objective: Vital signs in last 24 hours: Temp:  [97.7 F (36.5 C)-100.2 F (37.9 C)] 97.7 F (36.5 C) (12/13 1548) Pulse Rate:  [78-104] 90  (12/13 1800) Cardiac Rhythm:  [-] Normal sinus rhythm (12/13 1500) Resp:  [10-36] 26  (12/13 1800) BP: (88-132)/(36-73) 115/73 mmHg (12/13 1700) SpO2:  [89 %-100 %] 93 % (12/13 1800) Arterial Line BP: (92-144)/(44-82) 125/57 mmHg (12/13 1000) FiO2 (%):  [3 %-50 %] 3 % (12/13 1500) Weight:  [246 lb 14.6 oz (112 kg)] 246 lb 14.6 oz (112 kg) (12/13 0600)  Hemodynamic parameters for last 24 hours: PAP: (26-62)/(9-25) 37/14 mmHg CO:  [4.6 L/min] 4.6 L/min CI:  [2 L/min/m2] 2 L/min/m2  Intake/Output from previous day: 12/12 0701 - 12/13 0700 In: 5584.9 [I.V.:3054.9; Blood:1190; NG/GT:60; IV Piggyback:1280] Out: 5385 [Urine:3305; Blood:1450; Chest Tube:630] Intake/Output this shift: Total I/O In: 203.2 [I.V.:203.2] Out: 1095 [Urine:875; Chest Tube:220]    Lab Results:  [facility] 08/17/11 1655 08/17/11 1654 08/17/11 0357  WBC -- 14.4* 11.9*  HGB 13.6 12.5* --  HCT 40.0 37.8* --  PLT -- 109* 113*   BMET:  Basename 08/17/11 1655 08/17/11 1654 08/17/11 0357  NA 138 -- 137  K 4.7 -- 4.3  CL 101 -- 107  CO2 -- -- 24  GLUCOSE 115* -- 110*  BUN 20 -- 14  CREATININE 1.30 1.37* --  CALCIUM -- -- 7.7*    PT/INR:  Basename 08/16/11 1600  LABPROT 19.3*  INR 1.60*   ABG    Component Value Date/Time   PHART 7.368 08/17/2011 0058   HCO3 24.1* 08/17/2011 0058   TCO2 24 08/17/2011 1655   ACIDBASEDEF 1.0 08/17/2011 0058   O2SAT 94.0 08/17/2011 0058   CBG (last 3)   Basename 08/17/11 1544 08/17/11 1226 08/17/11 1055  GLUCAP 124* 106* 99    Assessment/Plan: S/P Procedure(s) (LRB): AORTIC VALVE REPLACEMENT (AVR) (N/A) CORONARY ARTERY BYPASS GRAFTING (CABG) (N/A) Now rapid a fib started  on cordarone   LOS: 1 day    Millena Callins B 08/17/2011

## 2011-08-17 NOTE — Progress Notes (Signed)
   CARDIOTHORACIC SURGERY PROGRESS NOTE   R1 Day Post-Op Procedure(s) (LRB): AORTIC VALVE REPLACEMENT (AVR) (N/A) CORONARY ARTERY BYPASS GRAFTING (CABG) (N/A)  Subjective: Feels well. Mild soreness but reports already feeling better than preop  Objective: Vital signs: BP Readings from Last 1 Encounters:  08/17/11 121/68   Pulse Readings from Last 1 Encounters:  08/17/11 95   Resp Readings from Last 1 Encounters:  08/17/11 29   Temp Readings from Last 1 Encounters:  08/17/11 99.7 F (37.6 C)     Hemodynamics: PAP: (26-62)/(9-41) 48/15 mmHg CO:  [4.1 L/min-4.6 L/min] 4.6 L/min CI:  [1.8 L/min/m2-2 L/min/m2] 2 L/min/m2  Physical Exam:  Rhythm:   Sinus tach  Breath sounds: clear  Heart sounds:  RRR  Incisions:  Dressings dry  Abdomen:  soft  Extremities:  warm   Intake/Output from previous day: 12/12 0701 - 12/13 0700 In: 5584.9 [I.V.:3054.9; Blood:1190; NG/GT:60; IV Piggyback:1280] Out: 5385 [Urine:3305; Blood:1450; Chest Tube:630] Intake/Output this shift: Total I/O In: 81.9 [I.V.:81.9] Out: 220 [Urine:150; Chest Tube:70]  Lab Results:  [facility] 08/17/11 0357 08/16/11 2133 08/16/11 2126  WBC 11.9* -- 12.2*  HGB 11.4* 11.2* --  HCT 34.8* 33.0* --  PLT 113* -- 112*   BMET:  Basename 08/17/11 0357 08/16/11 2133 08/14/11 1448  NA 137 138 --  K 4.3 4.6 --  CL 107 107 --  CO2 24 -- 21  GLUCOSE 110* 115* --  BUN 14 14 --  CREATININE 0.93 1.00 --  CALCIUM 7.7* -- 9.6    CBG (last 3)   Basename 08/17/11 0729 08/17/11 0621 08/17/11 0510  GLUCAP 103* 103* 106*   ABG    Component Value Date/Time   PHART 7.368 08/17/2011 0058   HCO3 24.1* 08/17/2011 0058   TCO2 25 08/17/2011 0058   ACIDBASEDEF 1.0 08/17/2011 0058   O2SAT 94.0 08/17/2011 0058   CXR: clear  Assessment/Plan: S/P Procedure(s) (LRB): AORTIC VALVE REPLACEMENT (AVR) (N/A) CORONARY ARTERY BYPASS GRAFTING (CABG) (N/A)  Doing well POD #1 Expected post op acute blood loss anemia,  mild Expected post op volume excess, mild HTN  Mobilize D/C lines Diuresis Increase beta blocker  OWEN,CLARENCE H 08/17/2011 10:21 AM

## 2011-08-18 ENCOUNTER — Inpatient Hospital Stay (HOSPITAL_COMMUNITY): Payer: BC Managed Care – PPO

## 2011-08-18 LAB — MAGNESIUM: Magnesium: 2.5 mg/dL (ref 1.5–2.5)

## 2011-08-18 LAB — CBC
HCT: 35.1 % — ABNORMAL LOW (ref 39.0–52.0)
Hemoglobin: 11.3 g/dL — ABNORMAL LOW (ref 13.0–17.0)
MCH: 30.4 pg (ref 26.0–34.0)
MCV: 94.4 fL (ref 78.0–100.0)
Platelets: 108 10*3/uL — ABNORMAL LOW (ref 150–400)
RBC: 3.72 MIL/uL — ABNORMAL LOW (ref 4.22–5.81)
WBC: 13.9 10*3/uL — ABNORMAL HIGH (ref 4.0–10.5)

## 2011-08-18 LAB — BASIC METABOLIC PANEL
CO2: 28 mEq/L (ref 19–32)
Calcium: 8.1 mg/dL — ABNORMAL LOW (ref 8.4–10.5)
Chloride: 100 mEq/L (ref 96–112)
Glucose, Bld: 142 mg/dL — ABNORMAL HIGH (ref 70–99)
Sodium: 135 mEq/L (ref 135–145)

## 2011-08-18 LAB — GLUCOSE, CAPILLARY
Glucose-Capillary: 124 mg/dL — ABNORMAL HIGH (ref 70–99)
Glucose-Capillary: 141 mg/dL — ABNORMAL HIGH (ref 70–99)

## 2011-08-18 LAB — HEPATIC FUNCTION PANEL
ALT: 48 U/L (ref 0–53)
AST: 102 U/L — ABNORMAL HIGH (ref 0–37)
Albumin: 3 g/dL — ABNORMAL LOW (ref 3.5–5.2)
Alkaline Phosphatase: 52 U/L (ref 39–117)
Bilirubin, Direct: 0.3 mg/dL (ref 0.0–0.3)
Indirect Bilirubin: 0.6 mg/dL (ref 0.3–0.9)
Total Bilirubin: 0.9 mg/dL (ref 0.3–1.2)
Total Protein: 5.8 g/dL — ABNORMAL LOW (ref 6.0–8.3)

## 2011-08-18 LAB — TSH: TSH: 9.745 u[IU]/mL — ABNORMAL HIGH (ref 0.350–4.500)

## 2011-08-18 MED ORDER — ASPIRIN EC 325 MG PO TBEC
325.0000 mg | DELAYED_RELEASE_TABLET | Freq: Every day | ORAL | Status: DC
  Administered 2011-08-18: 325 mg via ORAL
  Filled 2011-08-18 (×2): qty 1

## 2011-08-18 MED ORDER — POVIDONE-IODINE 10 % EX SOLN
1.0000 "application " | Freq: Two times a day (BID) | CUTANEOUS | Status: DC
  Administered 2011-08-18 – 2011-08-22 (×10): 1 via TOPICAL
  Filled 2011-08-18: qty 15

## 2011-08-18 MED ORDER — AMIODARONE HCL 200 MG PO TABS
400.0000 mg | ORAL_TABLET | Freq: Two times a day (BID) | ORAL | Status: DC
  Administered 2011-08-18 – 2011-08-23 (×11): 400 mg via ORAL
  Filled 2011-08-18 (×13): qty 2

## 2011-08-18 MED ORDER — ONDANSETRON HCL 4 MG PO TABS: 4.0000 mg | ORAL_TABLET | Freq: Four times a day (QID) | ORAL | Status: DC | PRN

## 2011-08-18 MED ORDER — FUROSEMIDE 40 MG PO TABS
40.0000 mg | ORAL_TABLET | Freq: Two times a day (BID) | ORAL | Status: DC
  Administered 2011-08-18 – 2011-08-23 (×11): 40 mg via ORAL
  Filled 2011-08-18 (×13): qty 1

## 2011-08-18 MED ORDER — ALPRAZOLAM 0.25 MG PO TABS
0.2500 mg | ORAL_TABLET | Freq: Four times a day (QID) | ORAL | Status: DC | PRN
  Administered 2011-08-18: 0.25 mg via ORAL
  Filled 2011-08-18: qty 1

## 2011-08-18 MED ORDER — ENOXAPARIN SODIUM 40 MG/0.4ML ~~LOC~~ SOLN
40.0000 mg | SUBCUTANEOUS | Status: DC
  Administered 2011-08-19 – 2011-08-22 (×4): 40 mg via SUBCUTANEOUS
  Filled 2011-08-18 (×5): qty 0.4

## 2011-08-18 MED ORDER — MOVING RIGHT ALONG BOOK
Freq: Once | Status: AC
  Administered 2011-08-18: 09:00:00
  Filled 2011-08-18: qty 1

## 2011-08-18 MED ORDER — ATORVASTATIN CALCIUM 10 MG PO TABS
20.0000 mg | ORAL_TABLET | Freq: Every day | ORAL | Status: DC
  Administered 2011-08-18 – 2011-08-22 (×5): 20 mg via ORAL
  Filled 2011-08-18 (×6): qty 2

## 2011-08-18 MED ORDER — SODIUM CHLORIDE 0.9 % IJ SOLN
3.0000 mL | Freq: Two times a day (BID) | INTRAMUSCULAR | Status: DC
  Administered 2011-08-18 – 2011-08-22 (×10): 3 mL via INTRAVENOUS

## 2011-08-18 MED ORDER — ONDANSETRON HCL 4 MG/2ML IJ SOLN: 4.0000 mg | Freq: Four times a day (QID) | INTRAMUSCULAR | Status: DC | PRN

## 2011-08-18 MED ORDER — ACETAMINOPHEN 325 MG PO TABS
650.0000 mg | ORAL_TABLET | Freq: Four times a day (QID) | ORAL | Status: DC | PRN
  Administered 2011-08-23: 650 mg via ORAL
  Filled 2011-08-18: qty 2

## 2011-08-18 MED ORDER — AMIODARONE HCL 200 MG PO TABS: 400.0000 mg | ORAL_TABLET | Freq: Two times a day (BID) | ORAL | Status: DC

## 2011-08-18 MED ORDER — SODIUM CHLORIDE 0.9 % IV SOLN: 250.0000 mL | INTRAVENOUS | Status: DC | PRN

## 2011-08-18 MED ORDER — INSULIN GLARGINE 100 UNIT/ML ~~LOC~~ SOLN
20.0000 [IU] | SUBCUTANEOUS | Status: DC
  Filled 2011-08-18: qty 3

## 2011-08-18 MED ORDER — SODIUM CHLORIDE 0.9 % IJ SOLN: 3.0000 mL | INTRAMUSCULAR | Status: DC | PRN

## 2011-08-18 MED ORDER — SIMVASTATIN 40 MG PO TABS: 40.0000 mg | ORAL_TABLET | Freq: Every day | ORAL | Status: DC

## 2011-08-18 MED ORDER — MIDAZOLAM HCL 2 MG/2ML IJ SOLN
2.0000 mg | Freq: Once | INTRAMUSCULAR | Status: AC
  Administered 2011-08-18: 2 mg via INTRAVENOUS
  Filled 2011-08-18: qty 2

## 2011-08-18 MED ORDER — OXYCODONE HCL 5 MG PO TABS
5.0000 mg | ORAL_TABLET | ORAL | Status: DC | PRN
  Administered 2011-08-18 – 2011-08-22 (×13): 10 mg via ORAL
  Filled 2011-08-18 (×13): qty 2

## 2011-08-18 MED ORDER — INSULIN ASPART 100 UNIT/ML ~~LOC~~ SOLN
0.0000 [IU] | Freq: Three times a day (TID) | SUBCUTANEOUS | Status: DC
  Administered 2011-08-18 – 2011-08-20 (×5): 2 [IU] via SUBCUTANEOUS
  Filled 2011-08-18: qty 3

## 2011-08-18 MED ORDER — POTASSIUM CHLORIDE CRYS ER 20 MEQ PO TBCR
20.0000 meq | EXTENDED_RELEASE_TABLET | Freq: Two times a day (BID) | ORAL | Status: DC
  Administered 2011-08-19 – 2011-08-23 (×9): 20 meq via ORAL
  Filled 2011-08-18 (×10): qty 1

## 2011-08-18 MED FILL — Sodium Chloride IV Soln 0.9%: INTRAVENOUS | Qty: 1000 | Status: AC

## 2011-08-18 MED FILL — Heparin Sodium (Porcine) Inj 1000 Unit/ML: INTRAMUSCULAR | Qty: 60 | Status: AC

## 2011-08-18 MED FILL — Sodium Chloride Irrigation Soln 0.9%: Qty: 3000 | Status: AC

## 2011-08-18 MED FILL — Potassium Chloride Inj 2 mEq/ML: INTRAVENOUS | Qty: 40 | Status: AC

## 2011-08-18 MED FILL — Magnesium Sulfate Inj 50%: INTRAMUSCULAR | Qty: 10 | Status: AC

## 2011-08-18 MED FILL — Electrolyte-R (PH 7.4) Solution: INTRAVENOUS | Qty: 6000 | Status: AC

## 2011-08-18 NOTE — Progress Notes (Signed)
   CARDIOTHORACIC SURGERY PROGRESS NOTE   R2 Days Post-Op Procedure(s) (LRB): AORTIC VALVE REPLACEMENT (AVR) (N/A) CORONARY ARTERY BYPASS GRAFTING (CABG) (N/A)  Subjective: Feels well, although had an episode rapid AF last night.  Converted back to NSR on IV amiodarone.  Objective: Vital signs: BP Readings from Last 1 Encounters:  08/18/11 112/69   Pulse Readings from Last 1 Encounters:  08/18/11 66   Resp Readings from Last 1 Encounters:  08/18/11 19   Temp Readings from Last 1 Encounters:  08/18/11 98.4 F (36.9 C) Oral    Hemodynamics: PAP: (33-48)/(11-15) 37/14 mmHg  Physical Exam:  Rhythm:   sinus  Breath sounds: clear  Heart sounds:  RRR  Incisions:  Clean and dry  Abdomen:  Soft, non-tender  Extremities:  warm   Intake/Output from previous day: 12/13 0701 - 12/14 0700 In: 1058.4 [P.O.:180; I.V.:693.4] Out: 1880 [Urine:1450; Chest Tube:430] Intake/Output this shift:    Lab Results:  Basename 08/18/11 0400 08/17/11 1655 08/17/11 1654  WBC 13.9* -- 14.4*  HGB 11.3* 13.6 --  HCT 35.1* 40.0 --  PLT 108* -- 109*   BMET:  Basename 08/18/11 0400 08/17/11 1655 08/17/11 0357  NA 135 138 --  K 4.5 4.7 --  CL 100 101 --  CO2 28 -- 24  GLUCOSE 142* 115* --  BUN 26* 20 --  CREATININE 1.40* 1.30 --  CALCIUM 8.1* -- 7.7*    CBG (last 3)   Basename 08/18/11 0403 08/18/11 0014 08/17/11 1902  GLUCAP 135* 141* 131*   ABG    Component Value Date/Time   PHART 7.368 08/17/2011 0058   HCO3 24.1* 08/17/2011 0058   TCO2 24 08/17/2011 1655   ACIDBASEDEF 1.0 08/17/2011 0058   O2SAT 94.0 08/17/2011 0058   CXR: Looks good  Assessment/Plan: S/P Procedure(s) (LRB): AORTIC VALVE REPLACEMENT (AVR) (N/A) CORONARY ARTERY BYPASS GRAFTING (CABG) (N/A)  Doing well POD #2 Episode postop AF last night, currently NSR on Amiodarone Expected post op acute blood loss anemia, mild Expected post op volume excess, mild  Mobilize Diuresis Continue amiodarone and  convert to p.o. Start coumadin if PAF recurrs D/C tubes Transfer step down  OWEN,CLARENCE H 08/18/2011 7:40 AM

## 2011-08-19 ENCOUNTER — Inpatient Hospital Stay (HOSPITAL_COMMUNITY): Payer: BC Managed Care – PPO

## 2011-08-19 LAB — GLUCOSE, CAPILLARY
Glucose-Capillary: 119 mg/dL — ABNORMAL HIGH (ref 70–99)
Glucose-Capillary: 89 mg/dL (ref 70–99)

## 2011-08-19 LAB — BASIC METABOLIC PANEL
BUN: 29 mg/dL — ABNORMAL HIGH (ref 6–23)
CO2: 30 mEq/L (ref 19–32)
Chloride: 96 mEq/L (ref 96–112)
Creatinine, Ser: 1.09 mg/dL (ref 0.50–1.35)
GFR calc Af Amer: 82 mL/min — ABNORMAL LOW (ref >90)
Glucose, Bld: 132 mg/dL — ABNORMAL HIGH (ref 70–99)
Potassium: 4.3 mEq/L (ref 3.5–5.1)

## 2011-08-19 LAB — CBC
HCT: 31.4 % — ABNORMAL LOW (ref 39.0–52.0)
Hemoglobin: 10.2 g/dL — ABNORMAL LOW (ref 13.0–17.0)
MCV: 93.2 fL (ref 78.0–100.0)
RBC: 3.37 MIL/uL — ABNORMAL LOW (ref 4.22–5.81)
RDW: 13.4 % (ref 11.5–15.5)
WBC: 8.3 10*3/uL (ref 4.0–10.5)

## 2011-08-19 MED ORDER — WARFARIN SODIUM 5 MG PO TABS
5.0000 mg | ORAL_TABLET | Freq: Every day | ORAL | Status: DC
  Filled 2011-08-19: qty 1

## 2011-08-19 MED ORDER — WARFARIN SODIUM 2.5 MG PO TABS
2.5000 mg | ORAL_TABLET | Freq: Every day | ORAL | Status: DC
  Administered 2011-08-19 – 2011-08-21 (×3): 2.5 mg via ORAL
  Filled 2011-08-19 (×4): qty 1

## 2011-08-19 MED ORDER — ASPIRIN EC 81 MG PO TBEC
81.0000 mg | DELAYED_RELEASE_TABLET | Freq: Every day | ORAL | Status: DC
  Administered 2011-08-20 – 2011-08-23 (×4): 81 mg via ORAL
  Filled 2011-08-19 (×4): qty 1

## 2011-08-19 MED ORDER — GUAIFENESIN ER 600 MG PO TB12
600.0000 mg | ORAL_TABLET | Freq: Two times a day (BID) | ORAL | Status: DC
  Administered 2011-08-19 – 2011-08-23 (×9): 600 mg via ORAL
  Filled 2011-08-19 (×11): qty 1

## 2011-08-19 MED ORDER — METOPROLOL TARTRATE 25 MG PO TABS
25.0000 mg | ORAL_TABLET | Freq: Once | ORAL | Status: AC
  Administered 2011-08-19: 25 mg via ORAL
  Filled 2011-08-19: qty 1

## 2011-08-19 MED ORDER — METOPROLOL TARTRATE 50 MG PO TABS
50.0000 mg | ORAL_TABLET | Freq: Two times a day (BID) | ORAL | Status: DC
  Administered 2011-08-19: 50 mg via ORAL
  Filled 2011-08-19 (×3): qty 1

## 2011-08-19 MED ORDER — WARFARIN SODIUM 2.5 MG PO TABS
2.5000 mg | ORAL_TABLET | Freq: Every day | ORAL | Status: DC
  Filled 2011-08-19: qty 1

## 2011-08-19 MED ORDER — LACTULOSE 10 GM/15ML PO SOLN
20.0000 g | Freq: Once | ORAL | Status: AC
  Administered 2011-08-19: 20 g via ORAL
  Filled 2011-08-19: qty 30

## 2011-08-19 NOTE — Progress Notes (Signed)
Assessed patient's ambulation status. Patient did not want to walk tonight. Will continue to monitor.

## 2011-08-19 NOTE — Progress Notes (Signed)
Ambulated with RW and 2L, assist x1. Fairly steady. HR mostly 120's brief bursts of 130s during walk. Pt asymptomatic but very positive. Sts he feels good but seems exerted with walking. Kept slow pace with x1 rest break. Return to recliner, will f/u.  c Maxtyn Nuzum, rn

## 2011-08-19 NOTE — Progress Notes (Signed)
CARDIAC REHAB PHASE I   PRE:  Rate/Rhythm: 120s Afib    BP: sitting 113/64    SaO2: 95 3L  MODE:  Ambulation: 350 ft   POST:  Rate/Rhythm: 143 Afib    BP: sitting 135/70     SaO2: wouldn't register, 2L  Ambulated with RW and 2L, assist x1. Fairly steady. HR mostly 130s, brief bursts of 140s during walk. Pt asymptomatic but very positive. Sts he feels good but seems exerted with walking. Kept slow pace with x1 rest break. Return to recliner, will f/u. Requests his name be sent to [place] [facility]. [id number]  Harriet Masson CES, ACSM

## 2011-08-19 NOTE — Progress Notes (Signed)
Subjective:  Post op AVR. Afib began early this am. No CP. +cough. Mild SOB with exertion.   Objective:  Vital Signs in the last 24 hours: Temp:  [97 F (36.1 C)-99.2 F (37.3 C)] 99.2 F (37.3 C) (12/15 0454) Pulse Rate:  [66-119] 119  (12/15 0454) Resp:  [18-22] 18  (12/15 0454) BP: (108-135)/(59-81) 108/71 mmHg (12/15 0454) SpO2:  [89 %-96 %] 96 % (12/15 0454) Weight:  [110 kg (242 lb 8.1 oz)] 242 lb 8.1 oz (110 kg) (12/15 0454)  Intake/Output from previous day: 12/14 0701 - 12/15 0700 In: 666.5 [P.O.:480; I.V.:186.5] Out: 1045 [Urine:1035; Chest Tube:10]   Physical Exam: General: Well developed, well nourished, in no acute distress. Head:  Normocephalic and atraumatic. Lungs: Clear to auscultation and percussion. Heart: Irreg irreg, no rub, tachy.  Pulses: Pulses normal in all 4 extremities. Extremities: Mild edema. Neurologic: Alert and oriented x 3.    Lab Results:  Basename 08/19/11 0630 08/18/11 0400  WBC 8.3 13.9*  HGB 10.2* 11.3*  PLT 101* 108*    Basename 08/19/11 0630 08/18/11 0400  NA 135 135  K 4.3 4.5  CL 96 100  CO2 30 28  GLUCOSE 132* 142*  BUN 29* 26*  CREATININE 1.09 1.40*   No results found for this basename: TROPONINI:2,CK,MB:2 in the last 72 hours Hepatic Function Panel  Basename 08/18/11 0400  PROT 5.8*  ALBUMIN 3.0*  AST 102*  ALT 48  ALKPHOS 52  BILITOT 0.9  BILIDIR 0.3  IBILI 0.6  Imaging:  EKG:  TELE personally reviewed - AFIB. RVR  Assessment/Plan:  Principal Problem:  *S/P AVR (aortic valve replacement) Active Problems:  S/P CABG x 1   AFIB - on amiodarone. Spoke with Dr. Laneta Simmers. Increasing metoprolol to 50 mg BID from 25 BID. Coumadin will be started. Creat improved. Will follow.   CAD - stable, one vessel bypass.   DM - stable.   SKAINS, MARK 08/19/2011, 11:35 AM

## 2011-08-19 NOTE — Progress Notes (Addendum)
3 Days Post-Op Procedure(s) (LRB): AORTIC VALVE REPLACEMENT (AVR) (N/A) CORONARY ARTERY BYPASS GRAFTING (CABG) (N/A)  Subjective: Patient with productive cough and complaints of constipation.  Objective: Vital signs in last 24 hours: Patient Vitals for the past 24 hrs:  BP Temp Temp src Pulse Resp SpO2 Height Weight  08/19/11 0454 108/71 mmHg 99.2 F (37.3 C) Axillary 119  18  96 % - 242 lb 8.1 oz (110 kg)  08/18/11 2230 119/59 mmHg - - 74  - - - -  08/18/11 2033 135/81 mmHg 97 F (36.1 C) Oral 77  18  94 % - -  08/18/11 1620 119/73 mmHg 98.6 F (37 C) Oral 86  22  89 % 5\' 11"  (1.803 m) -  08/18/11 1209 - 97.2 F (36.2 C) Axillary - - - - -  08/18/11 1200 109/59 mmHg - - 66  20  96 % - -  08/18/11 1100 110/65 mmHg - - 67  25  92 % - -  08/18/11 1000 116/68 mmHg - - 66  20  95 % - -   Pre op weight  106.1 kg Current Weight  08/19/11 242 lb 8.1 oz (110 kg)      Intake/Output from previous day: 12/14 0701 - 12/15 0700 In: 666.5 [P.O.:480; I.V.:186.5] Out: 1045 [Urine:1035; Chest Tube:10]   Physical Exam:  Cardiovascular: IRRR, IRRR;no murmurs, gallops, or rubs. Pulmonary: Clear to auscultation bilaterally; no rales, wheezes, or rhonchi. Abdomen: Soft, non tender, bowel sounds present. Extremities: Mild bilateral lower extremity edema. Wounds: Clean and dry.  No erythema or signs of infection.  Lab Results: CBC: Basename 08/19/11 0630 08/18/11 0400  WBC 8.3 13.9*  HGB 10.2* 11.3*  HCT 31.4* 35.1*  PLT 101* 108*   BMET:  Basename 08/19/11 0630 08/18/11 0400  NA 135 135  K 4.3 4.5  CL 96 100  CO2 30 28  GLUCOSE 132* 142*  BUN 29* 26*  CREATININE 1.09 1.40*  CALCIUM 8.3* 8.1*    PT/INR:  Basename 08/16/11 1600  LABPROT 19.3*  INR 1.60*   ABG:  INR: Will add last result for INR, ABG once components are confirmed Will add last 4 CBG results once components are confirmed  Assessment/Plan:  1. CV - Afib with increased heart rate briefly into 140's this  am.On Amiodarone 400 bid, Lopressor 25 bid. Will start Coumadin tonight.Monitor. 2.  Pulmonary - Encourage incentive spirometer.CXR today shows small b/l pleural effusions and atx, no pneumothorax, and some vascular congestion. 3. Volume Overload - Continue with diuresis. 4.  Acute blood loss anemia -H/H 10.2/31.4. 5.Thrombocytopenia-Platelets slightly decreased from 108 to 101. 6.Mucinex for cough 7.LOC for constipation 8.HGA1C pre op 6.1 Will stop scheduled insulin. Needs follow up as outpatient.  Ardelle Balls, PA-C 08/19/2011    Chart reviewed, patient examined, agree with above. Will increase lopressor to 50 bid since heart rate is getting up to 130 with ambulation and bp is adequate. Agree with starting coumadin.

## 2011-08-20 DIAGNOSIS — I4891 Unspecified atrial fibrillation: Secondary | ICD-10-CM

## 2011-08-20 HISTORY — DX: Unspecified atrial fibrillation: I48.91

## 2011-08-20 LAB — GLUCOSE, CAPILLARY: Glucose-Capillary: 126 mg/dL — ABNORMAL HIGH (ref 70–99)

## 2011-08-20 LAB — PROTIME-INR
INR: 1.07 (ref 0.00–1.49)
Prothrombin Time: 14.1 seconds (ref 11.6–15.2)

## 2011-08-20 MED ORDER — WARFARIN VIDEO
Freq: Once | Status: AC
  Administered 2011-08-20: 18:00:00

## 2011-08-20 MED ORDER — COUMADIN BOOK
Freq: Once | Status: AC
  Administered 2011-08-20: 18:00:00
  Filled 2011-08-20: qty 1

## 2011-08-20 MED ORDER — METOPROLOL TARTRATE 25 MG PO TABS
37.5000 mg | ORAL_TABLET | Freq: Two times a day (BID) | ORAL | Status: DC
  Administered 2011-08-20 (×2): 37.5 mg via ORAL
  Filled 2011-08-20 (×4): qty 1

## 2011-08-20 NOTE — Progress Notes (Signed)
Subjective:  Converted to SR last night. "Wet the bed" felt confusion last night. Now oriented and "back to normal". Feels better today.   Objective:  Vital Signs in the last 24 hours: Temp:  [97.3 F (36.3 C)-97.8 F (36.6 C)] 97.8 F (36.6 C) (12/16 0510) Pulse Rate:  [65-131] 65  (12/16 0510) Resp:  [18-20] 18  (12/16 0510) BP: (106-124)/(64-74) 124/70 mmHg (12/16 0510) SpO2:  [96 %-98 %] 98 % (12/16 0510) Weight:  [108.319 kg (238 lb 12.8 oz)] 238 lb 12.8 oz (108.319 kg) (12/16 0100)  Intake/Output from previous day: 12/15 0701 - 12/16 0700 In: -  Out: 1201 [Urine:1200; Stool:1]   Physical Exam: General: Well developed, well nourished, in no acute distress. Head:  Normocephalic and atraumatic. Lungs: Clear to auscultation and percussion. Heart: Normal S1 and S2.  No murmur, rubs or gallops.  Pulses: Pulses normal in all 4 extremities. Extremities: No clubbing or cyanosis. No edema. Neurologic: Alert and oriented x 3.    Lab Results:  Basename 08/19/11 0630 08/18/11 0400  WBC 8.3 13.9*  HGB 10.2* 11.3*  PLT 101* 108*    Basename 08/19/11 0630 08/18/11 0400  NA 135 135  K 4.3 4.5  CL 96 100  CO2 30 28  GLUCOSE 132* 142*  BUN 29* 26*  CREATININE 1.09 1.40*   No results found for this basename: TROPONINI:2,CK,MB:2 in the last 72 hours Hepatic Function Panel  Basename 08/18/11 0400  PROT 5.8*  ALBUMIN 3.0*  AST 102*  ALT 48  ALKPHOS 52  BILITOT 0.9  BILIDIR 0.3  IBILI 0.6   No results found for this basename: CHOL in the last 72 hours No results found for this basename: PROTIME in the last 72 hours  Imaging: Dg Chest 2 View  08/19/2011  *RADIOLOGY REPORT*  Clinical Data: Bypass surgery.  CHEST - 2 VIEW  Comparison: 08/18/2011.  Findings: The right IJ Cordis and mediastinal drain tubes have been removed.  The left-sided chest tube has also been removed.  No pneumothorax is identified.  The heart remains enlarged.  There is mild vascular congestion and  persistent areas of atelectasis. Small pleural effusions are noted.  IMPRESSION: Removal of support apparatus.  No pneumothorax. Persistent vascular congestion, areas of atelectasis and small effusions.  Original Report Authenticated By: P. Loralie Champagne, M.D.    EKG:  Tele - NSR personally viewed. Converted around midnight.    Assessment/Plan:  Principal Problem:  *S/P AVR (aortic valve replacement) Active Problems:  S/P CABG x 1   AFIB - parox with RVR. Now NSR. Continue current meds. Amio/metop. He did pace at some points. If necessary, back down on the metop. On Warfarin.   Confusion last night. Told him to avoid benzo if able.   AVR - doing well.   Kirk [name] 08/20/2011, 10:32 AM

## 2011-08-20 NOTE — Progress Notes (Signed)
Patient ambulated with RN 360ft using a rolling walker on 2LO2 at a moderate pace. Patient's gait was steady. Patient's O2 saturation upon return was 93-94 on 2L. Patient returned to the chair. Will continue to monitor.

## 2011-08-20 NOTE — Progress Notes (Addendum)
4 Days Post-Op Procedure(s) (LRB): AORTIC VALVE REPLACEMENT (AVR) (N/A) CORONARY ARTERY BYPASS GRAFTING (CABG) (N/A)  Subjective: Patient with bowel movement. Tolerating liquids and is hungry.  Objective: Vital signs in last 24 hours: Patient Vitals for the past 24 hrs:  BP Temp Temp src Pulse Resp SpO2 Weight  08/20/11 0510 124/70 mmHg 97.8 F (36.6 C) Oral 65  18  98 % -  08/20/11 0100 - - - - - - 238 lb 12.8 oz (108.319 kg)  08/19/11 2030 106/64 mmHg 97.3 F (36.3 C) Oral 131  19  96 % -  08/19/11 1411 114/74 mmHg 97.5 F (36.4 C) Oral 112  20  97 % -   Pre op weight  106.1 kg Current Weight  08/20/11 238 lb 12.8 oz (108.319 kg)      Intake/Output from previous day: 12/15 0701 - 12/16 0700 In: -  Out: 1201 [Urine:1200; Stool:1]   Physical Exam:  Cardiovascular: RRR;no murmurs, gallops, or rubs. Pulmonary: Slightly decreased at the bases; no rales, wheezes, or rhonchi. Abdomen: Soft, non tender, dome distention,bowel sounds present. Extremities: Mild bilateral lower extremity edema. Wounds: Clean and dry.  No erythema or signs of infection.  Lab Results: CBC:  Basename 08/19/11 0630 08/18/11 0400  WBC 8.3 13.9*  HGB 10.2* 11.3*  HCT 31.4* 35.1*  PLT 101* 108*   BMET:   Basename 08/19/11 0630 08/18/11 0400  NA 135 135  K 4.3 4.5  CL 96 100  CO2 30 28  GLUCOSE 132* 142*  BUN 29* 26*  CREATININE 1.09 1.40*  CALCIUM 8.3* 8.1*    PT/INR:   Basename 08/20/11 0525  LABPROT 14.1  INR 1.07   ABG:  INR: Will add last result for INR, ABG once components are confirmed Will add last 4 CBG results once components are confirmed  Assessment/Plan:  1. CV - Previous AFIB with RVR.Marland KitchenOn Amiodarone 400 bid, Lopressor 50 bid, and Coumadin.Occ VPaced at 60 this am. Will decrease Lopressor to 37.5 bid and monitor.If maintains SR, may be sable to stop Coumadin. 2.  Pulmonary - Encourage incentive spirometer.Wean O2. 3. Volume Overload - Continue with diuresis. 4.   Acute blood loss anemia -H/H 10.2/31.4. 5.Thrombocytopenia-Platelets slightly decreased from 108 to 101. 6.Mucinex for cough 7.HGA1C pre op 6.1 Will stop scheduled insulin. Needs follow up as outpatient. 8.Advance diet to heart healthy.  Ardelle Balls, PA-C 08/20/2011     Chart reviewed, patient examined, agree with above. Sinus 65 at this time.  Continue present meds.

## 2011-08-21 LAB — CBC
MCH: 30.4 pg (ref 26.0–34.0)
Platelets: 176 10*3/uL (ref 150–400)
RBC: 3.22 MIL/uL — ABNORMAL LOW (ref 4.22–5.81)
WBC: 7.3 10*3/uL (ref 4.0–10.5)

## 2011-08-21 LAB — GLUCOSE, CAPILLARY

## 2011-08-21 LAB — PROTIME-INR
INR: 1.13 (ref 0.00–1.49)
Prothrombin Time: 14.7 seconds (ref 11.6–15.2)

## 2011-08-21 MED ORDER — POTASSIUM CHLORIDE CRYS ER 20 MEQ PO TBCR: 20.0000 meq | EXTENDED_RELEASE_TABLET | Freq: Every day | ORAL | Status: DC

## 2011-08-21 MED ORDER — WARFARIN SODIUM 2.5 MG PO TABS: 2.5000 mg | ORAL_TABLET | Freq: Every day | ORAL | Status: DC

## 2011-08-21 MED ORDER — FUROSEMIDE 40 MG PO TABS: 40.0000 mg | ORAL_TABLET | Freq: Every day | ORAL | Status: DC

## 2011-08-21 MED ORDER — OXYCODONE HCL 5 MG PO TABS: 5.0000 mg | ORAL_TABLET | Freq: Four times a day (QID) | ORAL | Status: AC | PRN

## 2011-08-21 MED ORDER — AMIODARONE HCL 400 MG PO TABS: 400.0000 mg | ORAL_TABLET | Freq: Two times a day (BID) | ORAL | Status: DC

## 2011-08-21 MED ORDER — ASPIRIN 81 MG PO TBEC: 81.0000 mg | DELAYED_RELEASE_TABLET | Freq: Every day | ORAL | Status: DC

## 2011-08-21 MED ORDER — METOPROLOL TARTRATE 25 MG PO TABS
25.0000 mg | ORAL_TABLET | Freq: Two times a day (BID) | ORAL | Status: DC
  Administered 2011-08-21 – 2011-08-23 (×5): 25 mg via ORAL
  Filled 2011-08-21 (×6): qty 1

## 2011-08-21 MED ORDER — METOPROLOL TARTRATE 25 MG PO TABS: 25.0000 mg | ORAL_TABLET | Freq: Two times a day (BID) | ORAL | Status: DC

## 2011-08-21 NOTE — Discharge Summary (Signed)
[address]            [place] [address]          [phone]      Kirk [name] 23-Jul-1946 65 y.o. [id number]  08/16/2011   Purcell Nails, MD  AS, CAD  HPI:  Patient is a 65 year old moderately obese male who has been remarkably healthy most of his life. He was told during his youth that he had a heart murmur but he never had any formal medical evaluation. He served in [employer] without any concerns and he is never been noted to have any issues until recently. The patient does admit to decreased energy and fatigue as well as exertional shortness of breath that is gradually developed over the last year. 3 weeks ago he was helping a friend with strenuous labor lifting somewhat. He somewhat suddenly developed discomfort across his chest associated with severe fatigue dizziness and nausea. He sat down quickly but then shortly thereafter completely passed out. He recovered quickly and declined going to the hospital for evaluation of time. Was seen by his primary care physician and an echocardiogram and stress test were performed. The echocardiogram revealed severe aortic stenosis with normal left ventricular systolic function by report. Nuclear stress test was performed demonstrating normal baseline left ventricular function with ejection fraction 53%. There was no evidence for ischemia and the exam was felt to be low risk. The patient has been referred to consider elective aortic valve replacement. Cardiac catheterization has not been performed. He was referred to Dr. Tressie Stalker for surgical opinion.Dr. Cornelius Moras evaluated the patient and his studies and agreed with recommendations to proceed with aortic valve replacement as well as single-vessel bypass.  Past Medical History   Diagnosis  Date   .  Elevated cholesterol    .  ED (erectile dysfunction)    .  Kidney stones    .  Aortic stenosis     Past Surgical History   Procedure  Date   .  Lithotripsy   2001 AND 2002     DR PETERSON   .  C4-6 fusion  1995     DR STERN   History reviewed. No pertinent family history.  Social History  History   Substance Use Topics   .  Smoking status:  Never Smoker   .  Smokeless tobacco:  Not on file   .  Alcohol Use:  Yes      RARE    Current Outpatient Prescriptions   Medication  Sig  Dispense  Refill   .  amitriptyline (ELAVIL) 25 MG tablet  Take 25 mg by mouth at bedtime.     Marland Kitchen  atorvastatin (LIPITOR) 20 MG tablet  Take 20 mg by mouth daily.     .  Azelastine HCl (ASTEPRO) 0.15 % SOLN  Place 2 sprays into the nose 2 (two) times daily.     .  beta carotene w/minerals (OCUVITE) tablet  Take 1 tablet by mouth daily.     .  cholecalciferol (VITAMIN D) 1000 UNITS tablet  Take 1,000 Units by mouth daily.     .  fish oil-omega-3 fatty acids 1000 MG capsule  Take 2 g by mouth daily.     .  mometasone (NASONEX) 50 MCG/ACT nasal spray  Place 2 sprays into the nose daily.     .  tadalafil (CIALIS) 20  MG tablet  Take 20 mg by mouth daily as needed.     .  Vitamins-Lipotropics (LIPO-FLAVONOID PLUS) TABS  Take 1 tablet by mouth 3 (three) times daily after meals.     No Known Allergies   Review of Systems: at time of consultation General: normal appetite, decreased energy, 35 lb weight gain over 1 yr  Respiratory: no cough, no wheezing, no hemoptysis, no pain with inspiration or cough, mild exertional shortness of breath  Cardiac: no chest pain or tightness other than the single episode 3 weeks ago, mild exertional SOB, no resting SOB, no PND, no orthopnea, no LE edema, no palpitations, one syncopal episode, decreased exercise tolerance and increased fatigue  GI: no difficulty swallowing, no hematochezia, no hematemesis, no melena, no constipation, no diarrhea  GU: no dysuria, no urgency, no frequency  Musculoskeletal: no arthritis, no arthralgia other than left index finger  Vascular: no pain suggestive of claudication  Neuro: no symptoms suggestive of  TIA's, no seizures, no headaches, no peripheral neuropathy except chronic numbness all toes both feet  Endocrine: Negative  HEENT: no loose teeth or painful teeth, no recent vision changes  Psych: no anxiety, no depression  Physical Exam: at time of consultation BP 142/91  Pulse 110  Resp 20  Ht 5\' 11"  (1.803 m)  Wt 230 lb (104.327 kg)  BMI 32.08 kg/m2  SpO2 97%  General: well-appearing  HEENT: Unremarkable  Neck: no JVD, no bruits, no adenopathy  Chest: clear to auscultation, symmetrical breath sounds, no wheezes, no rhonchi  CV: RRR, grade III/VI systolic murmur  Abdomen: soft, non-tender, no masses  Extremities: warm, well-perfused, pulses palpable  Rectal/GU Deferred  Neuro: Grossly non-focal and symmetrical throughout  Skin: Clean and dry, no rashes, no breakdown  Diagnostic Tests:  Report of 2-D echocardiogram performed 07/13/2011 is reviewed. There was felt to be severe aortic stenosis. The peak velocity across the aortic valve measured 3.51 m/s. The peak and mean transvalvular gradients were estimated 49 and 31 mm mercury respectively corresponding to the aortic valve area estimated between 0.71 0.79 cm. The left ventricular outflow tract diameter was measured 2.1 cm. There was moderate calcification of the aortic valve with very limited excursion. The valve is felt to possibly be bicuspid. Left ventricular systolic function was normal with ejection fraction estimated 60-65%. There is moderate concentric left ventricular hypertrophy. There was mild aortic insufficiency. No other significant abnormalities were noted.  Left and right heart catheterization performed 08/03/2011 is reviewed. This demonstrates single-vessel coronary artery disease involving 70% ostial stenosis of a fairly large first diagonal branch of left anterior descending coronary artery. There was otherwise no significant coronary artery disease and right dominant coronary circulation. Pulmonary artery pressures  measured 32/19 with pulmonary catheter wedge pressure 15. Baseline cardiac output ranged between 4.2 and 5.0 L per minute corresponding to a cardiac index ranging between 1.9 and 2.2.       Hospital Course:the patient was admitted to the hospital and on 08/16/2011 he was taken the operating room where he underwent the following procedure:  Date of Procedure: 08/16/2011  Preoperative Diagnosis:  Severe Aortic Stenosis  Severe Single-vessel Coronary Artery Disease Postoperative Diagnosis: Same  Procedure:  Aortic Valve Replacement  25mm Edwards Magna Ease Pericardial Tissue Valve Coronary Artery Bypass Grafting x 1  Left Internal Mammary Artery to First Diagonal Branch off of Left Anterior Descending Coronary Artery Surgeon: Salvatore Decent. Cornelius Moras, MD  Assistant: Al Corpus, CSFA  Anesthesia: Arta Bruce, MD  Operative Findings:  Bicuspid  native aortic valve  Severe aortic stenosis  Moderate aortic insufficiency  Mild mitral regurgitation  Normal left ventricular systolic function  Good quality left internal mammary artery conduit  Good quality target vessel for grafting The patient tolerated the procedure well and was taken to the surgical intensive care unit in stable condition.  Postoperative hospital course:  The patient has done quite well. He was weaned from the ventilator without difficulty. He has remained neurologically intact although he did have one episode of confusion felt to be related to medications and has resolved. All routine lines, monitors, drainage devices have been discontinued in the standard fashion. He has had a expected acute blood loss anemia.hemoglobin and hematocrit are currently stable. He has an acute volume overload which is improving daily with diuresis. He did have a postoperative atrial fibrillation which has subsequently been chemically cardioverted to normal sinus rhythm using amiodarone and beta blocker. Oxygen has been weaned and he maintained good  saturations on room air. He is tolerating routine activities using standard protocols. Incision is healing well without evidence of infection. Tentatively he is felt to be stable for discharge in the morning pending reevaluation.    Basename 08/19/11 0630  NA 135  K 4.3  CL 96  CO2 30  GLUCOSE 132*  BUN 29*  CALCIUM 8.3*    Basename 08/21/11 0600 08/19/11 0630  WBC 7.3 8.3  HGB 9.8* 10.2*  HCT 30.3* 31.4*  PLT 176 101*    Basename 08/21/11 0600 08/20/11 0525  INR 1.13 1.07     Discharge Instructions:  The patient is discharged to home with extensive instructions on wound care and progressive ambulation.  They are instructed not to drive or perform any heavy lifting until returning to see the physician in his office.  Discharge Diagnosis:  AS, CAD  Secondary Diagnosis: Patient Active Problem List  Diagnoses  . Elevated cholesterol  . ED (erectile dysfunction)  . Kidney stones  . Aortic stenosis  . Aortic valve disorders  . Coronary artery disease  . S/P AVR (aortic valve replacement)  . S/P CABG x 1  . Atrial fibrillation   Past Medical History  Diagnosis Date  . Elevated cholesterol   . ED (erectile dysfunction)   . Kidney stones   . Aortic stenosis   . Coronary artery disease     aortic stenosis, CAD  . Angina     6 wks. ago  . Heart murmur   . Shortness of breath   . GERD (gastroesophageal reflux disease)   . Arthritis     1995- cerv. fusion- [facility]       Kirk [name], Kirk [name]  Home Medication Instructions [id number]   Printed on:08/21/11 1406  Medication Information                    amitriptyline (ELAVIL) 25 MG tablet Take 25 mg by mouth at bedtime.             cholecalciferol (VITAMIN D) 1000 UNITS tablet Take 1,000 Units by mouth daily.             atorvastatin (LIPITOR) 20 MG tablet Take 20 mg by mouth daily.             fish oil-omega-3 fatty acids 1000 MG capsule Take 2 g by mouth daily.             beta carotene w/minerals (OCUVITE)  tablet Take 1 tablet by mouth daily.  mometasone (NASONEX) 50 MCG/ACT nasal spray Place 2 sprays into the nose daily.             Azelastine HCl (ASTEPRO) 0.15 % SOLN Place 2 sprays into the nose 2 (two) times daily.             Vitamins-Lipotropics (LIPO-FLAVONOID PLUS) TABS Take 1 tablet by mouth 3 (three) times daily after meals.             calcium carbonate (TUMS - DOSED IN MG ELEMENTAL CALCIUM) 500 MG chewable tablet Chew 1 tablet by mouth daily.             amiodarone (PACERONE) 400 MG tablet Take 1 tablet (400 mg total) by mouth 2 (two) times daily.           aspirin EC 81 MG EC tablet Take 1 tablet (81 mg total) by mouth daily.           furosemide (LASIX) 40 MG tablet Take 1 tablet (40 mg total) by mouth daily.           metoprolol tartrate (LOPRESSOR) 25 MG tablet Take 1 tablet (25 mg total) by mouth 2 (two) times daily.           oxyCODONE (OXY IR/ROXICODONE) 5 MG immediate release tablet Take 1-2 tablets (5-10 mg total) by mouth every 6 (six) hours as needed.           potassium chloride SA (K-DUR,KLOR-CON) 20 MEQ tablet Take 1 tablet (20 mEq total) by mouth daily.           warfarin (COUMADIN) 2.5 MG tablet Take 1 tablet (2.5 mg total) by mouth daily at 6 PM.             Disposition: discharged home  Patient's condition is Good  Gershon Crane, PA-C 08/21/2011  2:06 PM

## 2011-08-21 NOTE — Discharge Summary (Signed)
I agree with the above discharge summary and plan for follow-up.  OWEN,CLARENCE H  

## 2011-08-21 NOTE — Progress Notes (Addendum)
[address]            [place] [address]          [phone]     5 Days Post-Op  Procedure(s) (LRB): AORTIC VALVE REPLACEMENT (AVR) (N/A) CORONARY ARTERY BYPASS GRAFTING (CABG) (N/A) Subjective Feeling better each day   Objective  Telemetry NSR, rate to 60 at times  Temp:  [97.2 F (36.2 C)-97.9 F (36.6 C)] 97.2 F (36.2 C) (12/17 0513) Pulse Rate:  [60-94] 60  (12/17 0513) Resp:  [18-20] 18  (12/17 0513) BP: (93-127)/(60-66) 95/60 mmHg (12/17 0513) SpO2:  [92 %-93 %] 93 % (12/17 0513) Weight:  [235 lb 12.8 oz (106.958 kg)] 235 lb 12.8 oz (106.958 kg) (12/17 0513)   Intake/Output Summary (Last 24 hours) at 08/21/11 0836 Last data filed at 08/21/11 0318  Gross per 24 hour  Intake    120 ml  Output   2200 ml  Net  -2080 ml   CBG 82-126 range for 24 hours    General appearance: alert and no distress Heart: regular rate and rhythm, S1, S2 normal, no murmur, click, rub or gallop Lungs: clear to auscultation bilaterally Abdomen: moderate distension, + BS, NT Extremities: mild edema Wound: incision healing well without signs of infection  Lab Results:  Basename 08/19/11 0630  NA 135  K 4.3  CL 96  CO2 30  GLUCOSE 132*  BUN 29*  CREATININE 1.09  CALCIUM 8.3*  MG --  PHOS --   No results found for this basename: AST:2,ALT:2,ALKPHOS:2,BILITOT:2,PROT:2,ALBUMIN:2 in the last 72 hours No results found for this basename: LIPASE:2,AMYLASE:2 in the last 72 hours  Basename 08/21/11 0600 08/19/11 0630  WBC 7.3 8.3  NEUTROABS -- --  HGB 9.8* 10.2*  HCT 30.3* 31.4*  MCV 94.1 93.2  PLT 176 101*   No results found for this basename: CKTOTAL:4,CKMB:4,TROPONINI:4 in the last 72 hours No components found with this basename: POCBNP:3 No results found for this basename: DDIMER in the last 72 hours No results found for this basename: HGBA1C in the last 72 hours No results found for this basename: CHOL,HDL,LDLCALC,TRIG,CHOLHDL in the  last 72 hours No results found for this basename: TSH,T4TOTAL,FREET3,T3FREE,THYROIDAB in the last 72 hours No results found for this basename: VITAMINB12,FOLATE,FERRITIN,TIBC,IRON,RETICCTPCT in the last 72 hours  Medications: Scheduled    . acetaminophen  1,000 mg Oral Q6H   Or  . acetaminophen (TYLENOL) oral liquid 160 mg/5 mL  975 mg Per Tube Q6H  . amiodarone  400 mg Oral BID  . amitriptyline  25 mg Oral QHS  . aspirin EC  81 mg Oral Daily  . atorvastatin  20 mg Oral q1800  . bisacodyl  10 mg Oral Daily   Or  . bisacodyl  10 mg Rectal Daily  . coumadin book   Does not apply Once  . enoxaparin (LOVENOX) injection  40 mg Subcutaneous Q24H  . furosemide  40 mg Oral BID  . guaiFENesin  600 mg Oral BID  . insulin aspart  0-24 Units Subcutaneous TID AC & HS  . metoprolol tartrate  37.5 mg Oral BID  . pantoprazole  40 mg Oral Q1200  . potassium chloride  20 mEq Oral BID  . povidone-iodine  1 application Topical BID  . sodium chloride  3 mL Intravenous Q12H  . warfarin  2.5 mg Oral q1800  . warfarin   Does not  apply Once     Radiology/Studies:  No results found.  INR:1.13 Will add last result for INR, ABG once components are confirmed Will add last 4 CBG results once components are confirmed  Assessment/Plan: S/P Procedure(s) (LRB): AORTIC VALVE REPLACEMENT (AVR) (N/A) CORONARY ARTERY BYPASS GRAFTING (CABG) (N/A) Mobilize Diuresis Diabetes control d/c pacing wires Cont AC RX Decrease lopressor to 25 BID     LOS: 5 days    GOLD,WAYNE E 12/17/20128:36 AM    I have seen and examined the patient and agree with the assessment and plan as outlined.  No further episodes of atrial fibrillation.  Progressing well.  Possible d/c home 1-2 days.  OWEN,CLARENCE H 08/21/2011 9:14 AM

## 2011-08-21 NOTE — Progress Notes (Signed)
CARDIAC REHAB PHASE I   PRE:  Rate/Rhythm: 63SR  BP:  Supine:   Sitting: 130/68  Standing:    SaO2: 100%1L  MODE:  Ambulation: 550 ft   POST:  Rate/Rhythem: 77SR  BP:  Supine:   Sitting:   Standing:  In bathroom   SaO2: 96%RA 0824-0900 Pt walked 550 ft on RA with rolling walker and asst x 1. Gait steady. Tolerated walk well. Pt needed to go to bathroom after walk so did not get BP. To use call light when he is finished. Sats good on RA.  Kirk [name]

## 2011-08-21 NOTE — Progress Notes (Signed)
UR Completed.  Kirk [name]. [id number]. 08/21/2011

## 2011-08-22 LAB — PROTIME-INR
INR: 1.18 (ref 0.00–1.49)
Prothrombin Time: 15.3 seconds — ABNORMAL HIGH (ref 11.6–15.2)

## 2011-08-22 LAB — GLUCOSE, CAPILLARY: Glucose-Capillary: 97 mg/dL (ref 70–99)

## 2011-08-22 MED ORDER — WARFARIN SODIUM 5 MG PO TABS
5.0000 mg | ORAL_TABLET | Freq: Every day | ORAL | Status: DC
  Administered 2011-08-22: 5 mg via ORAL
  Filled 2011-08-22 (×2): qty 1

## 2011-08-22 NOTE — Progress Notes (Signed)
CARDIAC REHAB PHASE I   PRE:  Rate/Rhythm: 77 SR    BP: sitting 116/59    SaO2: wouldn't register, RA  MODE:  Ambulation: 890 ft   POST:  Rate/Rhythm: 118 A fib    BP: sitting 131/65     SaO2: wouldn't register, on RA  Tolerated well. No c/o. In and out of A fib, highest 118. Asymptomatic.  [id number]  Elissa Lovett Benton CES, ACSM

## 2011-08-22 NOTE — Progress Notes (Signed)
Subjective:  65 year old with AVR, CABG, Afib. Feels better each day. No SOB. Tele personally viewed is showing bursts of AFIB/Flutter. Reasonable rate control.   Objective:  Vital Signs in the last 24 hours: Temp:  [97.2 F (36.2 C)-98.2 F (36.8 C)] 97.2 F (36.2 C) (12/18 0340) Pulse Rate:  [61-81] 80  (12/18 0956) Resp:  [20] 20  (12/18 0340) BP: (106-138)/(48-83) 131/65 mmHg (12/18 0956) SpO2:  [91 %-96 %] 96 % (12/18 0340) Weight:  [105.96 kg (233 lb 9.6 oz)] 233 lb 9.6 oz (105.96 kg) (12/18 0340)  Intake/Output from previous day: 12/17 0701 - 12/18 0700 In: -  Out: 1700 [Urine:1700]   Physical Exam: General: Well developed, well nourished, in no acute distress. Head:  Normocephalic and atraumatic. Lungs: Clear to auscultation and percussion. Heart: Normal S1 and S2.  Soft S murmur, rubs or gallops.  Pulses: Pulses normal in all 4 extremities. Extremities: No clubbing or cyanosis. No edema. Neurologic: Alert and oriented x 3.    Lab Results:  Basename 08/21/11 0600  WBC 7.3  HGB 9.8*  PLT 176    Assessment/Plan:  Principal Problem:  *S/P AVR (aortic valve replacement) Active Problems:  S/P CABG x 1  Atrial fibrillation  AFIB - amiodarone, metop 25 bid. Warfarin. INR 1.1. Pharmacy dosing.  Still having short bursts. As he heals, hopefully we will see resolution of AFIB. Continue amio for now.   Chronic anticoagulation - per pharmacy , subtx.   CAD/AVR - stable.  SKAINS, MARK 08/22/2011, 11:05 AM

## 2011-08-22 NOTE — Progress Notes (Signed)
   CARDIOTHORACIC SURGERY PROGRESS NOTE  6 Days Post-Op  S/P Procedure(s) (LRB): AORTIC VALVE REPLACEMENT (AVR) (N/A) CORONARY ARTERY BYPASS GRAFTING (CABG) (N/A)  Subjective: Looks and feels well.  Still going in and out of AFib/Aflutter but no associated symptoms  Objective: Vital signs in last 24 hours: Temp:  [97.2 F (36.2 C)-98.2 F (36.8 C)] 97.2 F (36.2 C) (12/18 0340) Pulse Rate:  [61-81] 78  (12/18 0340) Cardiac Rhythm:  [-] Sinus tachycardia (12/18 0725) Resp:  [20] 20  (12/18 0340) BP: (106-138)/(48-83) 138/83 mmHg (12/18 0340) SpO2:  [91 %-96 %] 96 % (12/18 0340) Weight:  [105.96 kg (233 lb 9.6 oz)] 233 lb 9.6 oz (105.96 kg) (12/18 0340)  Physical Exam:  Rhythm:   Sinus with periods of AF  Breath sounds: clear  Heart sounds:  RRR  Incisions:  Clean and dry  Abdomen:  soft  Extremities:  warm   Intake/Output from previous day: 12/17 0701 - 12/18 0700 In: -  Out: 1700 [Urine:1700] Intake/Output this shift:    Lab Results:  Basename 08/21/11 0600  WBC 7.3  HGB 9.8*  HCT 30.3*  PLT 176   BMET: No results found for this basename: NA:2,K:2,CL:2,CO2:2,GLUCOSE:2,BUN:2,CREATININE:2,CALCIUM:2 in the last 72 hours  CBG (last 3)   Basename 08/22/11 0341 08/21/11 2252 08/21/11 1610  GLUCAP 97 113* 96   PT/INR:   Basename 08/22/11 0553  LABPROT 15.3*  INR 1.18    CXR:  N/A  Assessment/Plan: S/P Procedure(s) (LRB): AORTIC VALVE REPLACEMENT (AVR) (N/A) CORONARY ARTERY BYPASS GRAFTING (CABG) (N/A)  Doing very well but still going in and out of Afib/Aflutter. Continue amiodarone, metoprolol Continue lovenox Increase coumadin Hold d/c one more day Recheck labs tomorrow  Purcell Nails 08/22/2011 8:15 AM

## 2011-08-23 LAB — PROTIME-INR: INR: 1.22 (ref 0.00–1.49)

## 2011-08-23 LAB — BASIC METABOLIC PANEL
BUN: 20 mg/dL (ref 6–23)
Calcium: 9 mg/dL (ref 8.4–10.5)
Chloride: 97 mEq/L (ref 96–112)
Creatinine, Ser: 1.25 mg/dL (ref 0.50–1.35)
GFR calc Af Amer: 69 mL/min — ABNORMAL LOW (ref >90)

## 2011-08-23 LAB — CBC
HCT: 32.3 % — ABNORMAL LOW (ref 39.0–52.0)
Platelets: 238 10*3/uL (ref 150–400)
RDW: 13.6 % (ref 11.5–15.5)
WBC: 9 10*3/uL (ref 4.0–10.5)

## 2011-08-23 MED ORDER — WARFARIN SODIUM 5 MG PO TABS: 5.0000 mg | ORAL_TABLET | Freq: Every day | ORAL | Status: DC

## 2011-08-23 NOTE — Progress Notes (Addendum)
Kirk [name] completed. Requests his name be sent to [facility]. [phone] [name] CES, ACSM

## 2011-08-23 NOTE — Progress Notes (Signed)
   CARDIOTHORACIC SURGERY PROGRESS NOTE  7 Days Post-Op  S/P Procedure(s) (LRB): AORTIC VALVE REPLACEMENT (AVR) (N/A) CORONARY ARTERY BYPASS GRAFTING (CABG) (N/A)  Subjective: Feels well. Maintaining NSR overnight   Objective: Vital signs in last 24 hours: Temp:  [97.5 F (36.4 C)-97.7 F (36.5 C)] 97.5 F (36.4 C) (12/19 0604) Pulse Rate:  [67-87] 67  (12/19 0604) Cardiac Rhythm:  [-] Normal sinus rhythm (12/18 2030) Resp:  [18-20] 18  (12/19 0604) BP: (102-133)/(53-66) 115/62 mmHg (12/19 0604) SpO2:  [97 %] 97 % (12/19 0604) Weight:  [104.146 kg (229 lb 9.6 oz)] 229 lb 9.6 oz (104.146 kg) (12/19 0604)  Physical Exam:  Rhythm:   sinus  Breath sounds: clear  Heart sounds:  RRR  Incisions:  Clean and dry  Abdomen:  soft  Extremities:  Warm, no edema   Intake/Output from previous day: 12/18 0701 - 12/19 0700 In: -  Out: 850 [Urine:850] Intake/Output this shift:    Lab Results:  Basename 08/23/11 0555 08/21/11 0600  WBC 9.0 7.3  HGB 10.5* 9.8*  HCT 32.3* 30.3*  PLT 238 176   BMET:  Basename 08/23/11 0555  NA 137  K 4.0  CL 97  CO2 30  GLUCOSE 133*  BUN 20  CREATININE 1.25  CALCIUM 9.0    CBG (last 3)   Basename 08/22/11 0341 08/21/11 2252 08/21/11 1610  GLUCAP 97 113* 96   PT/INR:   Basename 08/23/11 0555  LABPROT 15.7*  INR 1.22    CXR:  N/A  Assessment/Plan: S/P Procedure(s) (LRB): AORTIC VALVE REPLACEMENT (AVR) (N/A) CORONARY ARTERY BYPASS GRAFTING (CABG) (N/A)  Doing well and maintaining NSR D/C home today Coumadin 5 mg daily Recheck INR Monday at [facility]  [facility] H 08/23/2011 9:05 AM

## 2011-08-25 NOTE — Progress Notes (Signed)
   CARE MANAGEMENT NOTE 08/25/2011  Patient:  Kirk [name], Kirk [name]   Account Number:  [id number]  Date Initiated:  08/17/2011  Documentation initiated by:  [facility]  Subjective/Objective Assessment:   Post op AVR     Action/Plan:   PTA, PT INDEPENDENT, LIVES WITH SPOUSE.   Anticipated DC Date:  08/22/2011   Anticipated DC Plan:  HOME W HOME HEALTH SERVICES      DC Planning Services  CM consult      Choice offered to / List presented to:             Status of service:  Completed, signed off Medicare Important Message given?   (If response is "NO", the following Medicare IM given date fields will be blank) Date Medicare IM given:   Date Additional Medicare IM given:    Discharge Disposition:  HOME/SELF CARE  Per UR Regulation:  Reviewed for med. necessity/level of care/duration of stay  Comments:  08/23/11 Latania Bascomb,RN,BSN PT DISCHARGED TO HOME WITH HUSBAND.  NO HOME HEALTH OR DME NEEDED.  08-21-11 9:30am Avie Arenas, rNBSN [phone] UR completed.  08-17-11 [place], RNBSN [phone] UR Completed.

## 2011-09-07 ENCOUNTER — Other Ambulatory Visit: Payer: Self-pay | Admitting: Thoracic Surgery (Cardiothoracic Vascular Surgery)

## 2011-09-07 DIAGNOSIS — I2510 Atherosclerotic heart disease of native coronary artery without angina pectoris: Secondary | ICD-10-CM

## 2011-09-11 ENCOUNTER — Ambulatory Visit (INDEPENDENT_AMBULATORY_CARE_PROVIDER_SITE_OTHER): Payer: Self-pay | Admitting: Physician Assistant

## 2011-09-11 ENCOUNTER — Ambulatory Visit
Admission: RE | Admit: 2011-09-11 | Discharge: 2011-09-11 | Disposition: A | Discharge status: Home / Self Care | Payer: BC Managed Care – PPO | Source: Ambulatory Visit | Attending: Thoracic Surgery (Cardiothoracic Vascular Surgery) | Admitting: Thoracic Surgery (Cardiothoracic Vascular Surgery)

## 2011-09-11 VITALS — BP 114/75 | HR 60 | Resp 18 | Ht 71.0 in | Wt 216.0 lb

## 2011-09-11 DIAGNOSIS — Z951 Presence of aortocoronary bypass graft: Secondary | ICD-10-CM

## 2011-09-11 DIAGNOSIS — I350 Nonrheumatic aortic (valve) stenosis: Secondary | ICD-10-CM

## 2011-09-11 DIAGNOSIS — I359 Nonrheumatic aortic valve disorder, unspecified: Secondary | ICD-10-CM

## 2011-09-11 DIAGNOSIS — I2510 Atherosclerotic heart disease of native coronary artery without angina pectoris: Secondary | ICD-10-CM

## 2011-09-11 DIAGNOSIS — Z954 Presence of other heart-valve replacement: Secondary | ICD-10-CM

## 2011-09-11 DIAGNOSIS — Z952 Presence of prosthetic heart valve: Secondary | ICD-10-CM

## 2011-09-11 NOTE — Progress Notes (Signed)
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 The patient's early postoperative recovery while in the hospital was notable for post op afib. Since hospital discharge the patient reports he has no  complaints. He feels fairly well and is "getting stronger everyday. He ambulates three times daily.   Current Outpatient Prescriptions  Medication Sig Dispense Refill  . amiodarone (PACERONE) 400 MG tablet Take 1 tablet (400 mg total) by mouth 2 (two) times daily.  70 tablet  1  . atorvastatin (LIPITOR) 20 MG tablet Take 20 mg by mouth daily.        . calcium carbonate (TUMS - DOSED IN MG ELEMENTAL CALCIUM) 500 MG chewable tablet Chew 1 tablet by mouth daily.        . cholecalciferol (VITAMIN D) 1000 UNITS tablet Take 1,000 Units by mouth daily.        . fish oil-omega-3 fatty acids 1000 MG capsule Take 2 g by mouth daily.        . metoprolol tartrate (LOPRESSOR) 25 MG tablet Take 1 tablet (25 mg total) by mouth 2 (two) times daily.  60 tablet  1  . potassium chloride SA (K-DUR,KLOR-CON) 20 MEQ tablet Take 1 tablet (20 mEq total) by mouth daily.  5 tablet  0  . Vitamins-Lipotropics (LIPO-FLAVONOID PLUS) TABS Take 1 tablet by mouth 3 (three) times daily after meals.        Marland Kitchen amitriptyline (ELAVIL) 25 MG tablet Take 25 mg by mouth at bedtime.        Marland Kitchen aspirin EC 81 MG EC tablet Take 1 tablet (81 mg total) by mouth daily.      . beta carotene w/minerals (OCUVITE) tablet Take 1 tablet by mouth daily.        Marland Kitchen warfarin (COUMADIN) 5 MG tablet Take 1 tablet (5 mg total) by mouth daily at 6 PM.  60 tablet  1  Vital Signs: BP 114/75, HR 60, RR 18, O2 Sat  98 % on room air  Physical Exam: Cardiovascular: Regular rate and rhythm; no murmurs, gallops, or rub. Pulmonary: Slightly decreased at the left base; right lung clear; no rales, wheezes, or rhonchi. Abdomen: soft, nontender, bowel sounds present Extremities: No lower extremity edema. Sternal  wound: Sternum is solid. Wound is clean, dry, well-healed, no signs of infection.  Diagnostic Tests: PA lateral chest x-ray done today showed probable calcified granuloma the right apex, small bilateral pleural effusion, minimal left lower lobe atelectasis, and no pneumothorax.    Impression and Plan: Overall, patient has continued to progress well status post CABG x1 and AVR. He has already seen Dr. Anne Fu in followup and will continue to be followed closely by him. Dr. Anne Fu has decreased his amiodarone 200 mg by mouth daily. His PT and INR are being monitored by Dr. Anne Fu office as well.  Hopefully, he will be able to stop the Coumadin in the next 1-2 months.Patient states he has not taken any narcotics for pain and over 10 days. He was instructed he may  begin driving short distances i.e. 30 minutes or less during the day.He may gradually increase his frequency and duration as tolerates. He has been contacted by cardiac rehabilitation. He was instructed he may begin the program. He was instructed to continue sternal precautions i.e. no lifting more than 10 pounds for 2-4 more weeks. He was also instructed his HGA1C pre op was 6.1. He should maintain a sugar free diet and make a follow up Appointment to  see his medical doctor regarding further surveillance. He will return to see Dr. Cornelius Moras in 3-4 weeks.

## 2011-09-21 ENCOUNTER — Encounter (HOSPITAL_COMMUNITY)
Admission: RE | Admit: 2011-09-21 | Discharge: 2011-09-21 | Disposition: A | Discharge status: Home / Self Care | Payer: BC Managed Care – PPO | Source: Ambulatory Visit | Attending: Cardiology | Admitting: Cardiology

## 2011-09-21 ENCOUNTER — Encounter (HOSPITAL_COMMUNITY): Payer: Self-pay

## 2011-09-21 DIAGNOSIS — I2510 Atherosclerotic heart disease of native coronary artery without angina pectoris: Secondary | ICD-10-CM | POA: Insufficient documentation

## 2011-09-21 DIAGNOSIS — E669 Obesity, unspecified: Secondary | ICD-10-CM | POA: Insufficient documentation

## 2011-09-21 DIAGNOSIS — I359 Nonrheumatic aortic valve disorder, unspecified: Secondary | ICD-10-CM | POA: Insufficient documentation

## 2011-09-21 DIAGNOSIS — Z5189 Encounter for other specified aftercare: Secondary | ICD-10-CM | POA: Insufficient documentation

## 2011-09-21 DIAGNOSIS — E119 Type 2 diabetes mellitus without complications: Secondary | ICD-10-CM | POA: Insufficient documentation

## 2011-09-21 DIAGNOSIS — I4891 Unspecified atrial fibrillation: Secondary | ICD-10-CM | POA: Insufficient documentation

## 2011-09-21 DIAGNOSIS — Z954 Presence of other heart-valve replacement: Secondary | ICD-10-CM | POA: Insufficient documentation

## 2011-09-21 DIAGNOSIS — I10 Essential (primary) hypertension: Secondary | ICD-10-CM | POA: Insufficient documentation

## 2011-09-21 DIAGNOSIS — Z951 Presence of aortocoronary bypass graft: Secondary | ICD-10-CM | POA: Insufficient documentation

## 2011-09-25 ENCOUNTER — Encounter (HOSPITAL_COMMUNITY)
Admission: RE | Admit: 2011-09-25 | Discharge: 2011-09-25 | Disposition: A | Discharge status: Home / Self Care | Payer: BC Managed Care – PPO | Source: Ambulatory Visit | Attending: Cardiology | Admitting: Cardiology

## 2011-09-25 NOTE — Progress Notes (Signed)
Pt started cardiac rehab today.  Pt tolerated light exercise without difficulty.  Denies chest pain or dyspnea.  VSS.  Telemetry- NSR, NSSTT changes, rare PVC.  Pt oriented to exercise routine and equipment.  Understanding verbalized.  Will continue to monitor the patient throughout  the program.

## 2011-09-27 ENCOUNTER — Encounter (HOSPITAL_COMMUNITY)
Admission: RE | Admit: 2011-09-27 | Discharge: 2011-09-27 | Disposition: A | Discharge status: Home / Self Care | Payer: BC Managed Care – PPO | Source: Ambulatory Visit | Attending: Cardiology | Admitting: Cardiology

## 2011-09-29 ENCOUNTER — Encounter (HOSPITAL_COMMUNITY)
Admission: RE | Admit: 2011-09-29 | Discharge: 2011-09-29 | Disposition: A | Discharge status: Home / Self Care | Payer: BC Managed Care – PPO | Source: Ambulatory Visit | Attending: Cardiology | Admitting: Cardiology

## 2011-10-02 ENCOUNTER — Encounter (HOSPITAL_COMMUNITY): Payer: BC Managed Care – PPO

## 2011-10-03 NOTE — Progress Notes (Signed)
Kirk [name] 66 y.o. male       Nutrition Screen                                                                    YES  NO Do you live in a nursing home?  X   Do you eat out more than 3 times/week?    X If yes, how many times per week do you eat out?  Do you have food allergies?   X If yes, what are you allergic to?  Have you gained or lost more than 10 lbs without trying?               X If yes, how much weight have you lost and over what time period?  lbs gained or lost over  weeks/month  Do you want to lose weight?    X  If yes, what is a goal weight or amount of weight you would like to lose? 20 lbs  Do you eat alone most of the time?   X   Do you eat less than 2 meals/day?  X If yes, how many meals do you eat?  Do you drink more than 3 alcohol drinks/day?  X If yes, how many drinks per day?  Are you having trouble with constipation? *  X If yes, what are you doing to help relieve constipation?  Do you have financial difficulties with buying food?*    X   Are you experiencing regular nausea/ vomiting?*     X   Do you have a poor appetite? *                                        X   Do you have trouble chewing/swallowing? *   X    Pt with diagnoses of:  X CABG              X AVR/MVR/ICD      X Dyslipidemia  / HDL< 40 / LDL>70 / High TG      X %  Body fat >goal / Body Mass Index >25 X A1c >6 / CBG >126       Pt Risk Score    1        Diagnosis Risk Score  70       Total Risk Score   71                        X High Risk                Low Risk              HT: 70" Ht Readings from Last 1 Encounters:  09/21/11 5\' 10"  (1.778 m)    WT:   218 lb (99.1 kg) Wt Readings from Last 3 Encounters:  09/21/11 218 lb 7.6 oz (99.1 kg)  09/11/11 216 lb (97.977 kg)  08/23/11 229 lb 9.6 oz (104.146 kg)     IBW 75.5 131%IBW BMI 31.3 34.9%body fat   Meds reviewed: Warfarin, Omega 3, MVI, vitamin D3, Lipoflavonoid Plus  Past  Medical History  Diagnosis Date  . Elevated cholesterol    . ED (erectile dysfunction)   . Kidney stones   . Aortic stenosis   . Coronary artery disease     aortic stenosis, CAD  . Angina     6 wks. ago  . Heart murmur   . Shortness of breath   . GERD (gastroesophageal reflux disease)   . [facility]     [facility]        Activity level: Pt is active  Wt goal: 194-206 lb ( 88.2-93.6 kg) Current tobacco use? No      Food/Drug Interaction? Yes   If Y, which med(s)? Warfarin If yes, pt given Food/Drug Interaction handout? Yes  Labs:  Lipid Panel  No results found for this basename: chol, trig, hdl, cholhdl, vldl, ldlcalc   Lab Results  Component Value Date   HGBA1C 6.1* 08/14/2011  08/23/11 Glucose 133   LDL goal: < 100      MI, DM, Carotid or PVD and > 2:     > 66 yo male  Estimated Daily Nutrition Needs for: ? wt loss  1700-2200 Kcal, Total Fat 45-60gm, Saturated Fat 12-17 gm, Trans Fat 1.6-2.2 gm,  Sodium less than 1500 mg

## 2011-10-04 ENCOUNTER — Encounter (HOSPITAL_COMMUNITY)
Admission: RE | Admit: 2011-10-04 | Discharge: 2011-10-04 | Disposition: A | Discharge status: Home / Self Care | Payer: BC Managed Care – PPO | Source: Ambulatory Visit | Attending: Cardiology | Admitting: Cardiology

## 2011-10-06 ENCOUNTER — Encounter (HOSPITAL_COMMUNITY)
Admission: RE | Admit: 2011-10-06 | Discharge: 2011-10-06 | Disposition: A | Discharge status: Home / Self Care | Payer: BC Managed Care – PPO | Source: Ambulatory Visit | Attending: Cardiology | Admitting: Cardiology

## 2011-10-06 DIAGNOSIS — E669 Obesity, unspecified: Secondary | ICD-10-CM | POA: Insufficient documentation

## 2011-10-06 DIAGNOSIS — Z5189 Encounter for other specified aftercare: Secondary | ICD-10-CM | POA: Insufficient documentation

## 2011-10-06 DIAGNOSIS — I10 Essential (primary) hypertension: Secondary | ICD-10-CM | POA: Insufficient documentation

## 2011-10-06 DIAGNOSIS — I2510 Atherosclerotic heart disease of native coronary artery without angina pectoris: Secondary | ICD-10-CM | POA: Insufficient documentation

## 2011-10-06 DIAGNOSIS — E119 Type 2 diabetes mellitus without complications: Secondary | ICD-10-CM | POA: Insufficient documentation

## 2011-10-06 DIAGNOSIS — Z954 Presence of other heart-valve replacement: Secondary | ICD-10-CM | POA: Insufficient documentation

## 2011-10-06 DIAGNOSIS — Z951 Presence of aortocoronary bypass graft: Secondary | ICD-10-CM | POA: Insufficient documentation

## 2011-10-06 DIAGNOSIS — I4891 Unspecified atrial fibrillation: Secondary | ICD-10-CM | POA: Insufficient documentation

## 2011-10-06 DIAGNOSIS — I359 Nonrheumatic aortic valve disorder, unspecified: Secondary | ICD-10-CM | POA: Insufficient documentation

## 2011-10-06 NOTE — Progress Notes (Signed)
Pt hypertensive at rest 160/90and with exercise 178/80  at cardiac rehab today. Workloads decreased as a result.   Pt reports he did not take AM meds prior to class.  Also, we was running late so he skipped breakfast.  Pt advised to take AM meds prior to exercise and eat light meal prior to exercise.    Pt verbalized understanding.  Will continue to monitor the patient throughout  the program.-jr,rn -jr,rn

## 2011-10-09 ENCOUNTER — Ambulatory Visit (INDEPENDENT_AMBULATORY_CARE_PROVIDER_SITE_OTHER): Payer: Self-pay | Admitting: Thoracic Surgery (Cardiothoracic Vascular Surgery)

## 2011-10-09 ENCOUNTER — Encounter: Payer: Self-pay | Admitting: Thoracic Surgery (Cardiothoracic Vascular Surgery)

## 2011-10-09 ENCOUNTER — Encounter (HOSPITAL_COMMUNITY)
Admission: RE | Admit: 2011-10-09 | Discharge: 2011-10-09 | Disposition: A | Discharge status: Home / Self Care | Payer: BC Managed Care – PPO | Source: Ambulatory Visit | Attending: Cardiology | Admitting: Cardiology

## 2011-10-09 VITALS — BP 125/66 | HR 64 | Resp 20 | Ht 71.0 in | Wt 223.0 lb

## 2011-10-09 DIAGNOSIS — I2510 Atherosclerotic heart disease of native coronary artery without angina pectoris: Secondary | ICD-10-CM

## 2011-10-09 DIAGNOSIS — Z954 Presence of other heart-valve replacement: Secondary | ICD-10-CM

## 2011-10-09 DIAGNOSIS — I350 Nonrheumatic aortic (valve) stenosis: Secondary | ICD-10-CM

## 2011-10-09 DIAGNOSIS — Z952 Presence of prosthetic heart valve: Secondary | ICD-10-CM

## 2011-10-09 DIAGNOSIS — I359 Nonrheumatic aortic valve disorder, unspecified: Secondary | ICD-10-CM

## 2011-10-09 DIAGNOSIS — Z951 Presence of aortocoronary bypass graft: Secondary | ICD-10-CM

## 2011-10-09 NOTE — Progress Notes (Signed)
[address]            [place] [address]          [phone]     CARDIOTHORACIC SURGERY OFFICE NOTE  Referring Provider is Donato Schultz, MD PCP is [name], MD, MD   HPI:  Patient returns for follow-up status post aortic valve replacement and coronary artery bypass grafting on 08/16/2011. He was last seen here in our office by one of our physician assistants on 09/11/2011. Since then Mr. Normoyle has done exceptionally well. He reports minimal residual soreness in his chest. He has not been taking any pain medications for well over a month. He has no shortness of breath and in fact he states that his exercise tolerance and breathing is noticeably much better than it was prior to surgery. As been actively participating in the cardiac rehabilitation program and progressing well. He walks every day. Overall he is delighted with his progress and he has no complaints.   Current Outpatient Prescriptions  Medication Sig Dispense Refill  . amiodarone (PACERONE) 400 MG tablet Take 200 mg by mouth daily.       Marland Kitchen amitriptyline (ELAVIL) 25 MG tablet Take 25 mg by mouth at bedtime.        Marland Kitchen aspirin EC 81 MG EC tablet Take 1 tablet (81 mg total) by mouth daily.      Marland Kitchen atorvastatin (LIPITOR) 20 MG tablet Take 40 mg by mouth daily.       Marland Kitchen azelastine (ASTELIN) 137 MCG/SPRAY nasal spray Place 1 spray into the nose 2 (two) times daily. Use in each nostril as directed      . beta carotene w/minerals (OCUVITE) tablet Take 1 tablet by mouth daily.        . mometasone (NASONEX) 50 MCG/ACT nasal spray Place 2 sprays into the nose daily.      . Multiple Vitamin (MULTIVITAMIN) tablet Take 1 tablet by mouth daily.      . Vitamins-Lipotropics (LIPO-FLAVONOID PLUS) TABS Take 1 tablet by mouth 3 (three) times daily after meals.        . warfarin (COUMADIN) 5 MG tablet Take 1 tablet (5 mg total) by mouth daily at 6 PM.  60 tablet  1      Physical Exam:   BP  125/66  Pulse 64  Resp 20  Ht 5\' 11"  (1.803 m)  Wt 223 lb (101.152 kg)  BMI 31.10 kg/m2  SpO2 94%  General:  Well-appearing  Chest:   Sternal incision has healed nicely. Breath sounds are clear.  CV:   Regular rate and rhythm  Abdomen:  Soft and nontender  Extremities:  Warm and well-perfused  Diagnostic Tests:  n/a   Impression:  Patient is doing very well 2 months following aortic valve replacement and coronary artery bypass grafting. He is progressing nicely and the cardiac rehabilitation program. He has not had any recurrence of atrial fibrillation.  Plan:  I think it would be reasonable to consider stopping amiodarone at this time. He should probably stay on Coumadin for at least another month. It would also be reasonable to consider adding an ACE inhibitor or ARB to his medical regimen. At some point it would also be wise to get a followup echocardiogram to check his LV function status post aortic valve replacement.  We will leave these decisions to Dr. Anne Fu' discretion.  I think it  is okay for the patient go back to work cautioned him to gradually increase when he does not started do too much all at once. I've reminded him to avoid any sort of heavy lifting or strenuous use of his arms or shoulders for at least another month. All of his questions been addressed. We will plan to see him back in 4 months time for routine follow up.   Salvatore Decent. Cornelius Moras, MD 10/09/2011 5:27 PM

## 2011-10-09 NOTE — Progress Notes (Addendum)
Kirk [name] 66 y.o. male Nutrition Note  Spoke with pt.  Nutrition Plan and Nutrition Survey reviewed with pt.  Weight loss tips discussed.  Pt reports his UBW was 239# pre-hospitalization.  Pt wt is down 21#, which is an 8.9% decrease from pt UBW.  Pt states he lost wt due to changing his diet and not drinking regular soda all day long.  Pt describes his wife as a "meat and potatoes" person so he has to buy fruit and veggies and prepare them for himself.  Pt is aware of being pre-diabetic and has tried to decrease added sugar and increase whole grains in his diet. Pt is currently following a step 2 heart healthy diet. Coumadin and Consistent vitamin K intake discussed.  Pt aware of vitamin K and Coumadin food-drug interaction.  Per discussion with pt, pt suspects he will be taken off of Coumadin this coming Friday.  Nutrition Diagnosis   Food-and nutrition-related knowledge deficit related to lack of exposure to information as related to diagnosis of: ? CVD ? Pre-DM (A1c 6.1)   Obesity related to excessive energy intake as evidenced by a BMI of 31.3  Estimated Daily Nutrition Needs for: ? wt loss  1700-2200 Kcal, Total Fat 45-60gm, Saturated Fat 12-17 gm, Trans Fat 1.6-2.2 gm,  Sodium less than 1500 mg   Nutrition Intervention   Pt's individual nutrition plan including cholesterol goals reviewed with pt.   Benefits of adopting Therapeutic Lifestyle Changes discussed when Medficts reviewed.   Pt to attend the Portion Distortion class   Pt to attend the  ? Nutrition I class                          ? Nutrition II class    Pt given handouts for: ? wt loss ? pre-DM    Continue client-centered nutrition education by RD, as part of interdisciplinary care. Goal(s)   Pt to identify food quantities necessary to achieve: ? wt loss to a goal wt of 194-206# (88.2-93.6 kg) at graduation from cardiac rehab.    Pt to describe the benefit of including fruits, vegetables, whole grains, and low-fat dairy  products in a heart healthy meal plan. Monitor and Evaluate progress toward nutrition goal with team.

## 2011-10-09 NOTE — Patient Instructions (Signed)
The patient has been reminded to continue to avoid any heavy lifting or strenuous use of arms or shoulders for at least a total of three months from the time of surgery.  

## 2011-10-11 ENCOUNTER — Encounter (HOSPITAL_COMMUNITY)
Admission: RE | Admit: 2011-10-11 | Discharge: 2011-10-11 | Disposition: A | Discharge status: Home / Self Care | Payer: BC Managed Care – PPO | Source: Ambulatory Visit | Attending: Cardiology | Admitting: Cardiology

## 2011-10-13 ENCOUNTER — Other Ambulatory Visit: Payer: Self-pay | Admitting: Surgical

## 2011-10-13 ENCOUNTER — Encounter (HOSPITAL_COMMUNITY)
Admission: RE | Admit: 2011-10-13 | Discharge: 2011-10-13 | Disposition: A | Discharge status: Home / Self Care | Payer: BC Managed Care – PPO | Source: Ambulatory Visit | Attending: Cardiology | Admitting: Cardiology

## 2011-10-16 ENCOUNTER — Encounter (HOSPITAL_COMMUNITY)
Admission: RE | Admit: 2011-10-16 | Discharge: 2011-10-16 | Disposition: A | Discharge status: Home / Self Care | Payer: BC Managed Care – PPO | Source: Ambulatory Visit | Attending: Cardiology | Admitting: Cardiology

## 2011-10-18 ENCOUNTER — Encounter (HOSPITAL_COMMUNITY): Payer: BC Managed Care – PPO

## 2011-10-20 ENCOUNTER — Encounter (HOSPITAL_COMMUNITY): Payer: BC Managed Care – PPO

## 2011-10-23 ENCOUNTER — Encounter (HOSPITAL_COMMUNITY): Payer: BC Managed Care – PPO

## 2011-10-24 ENCOUNTER — Telehealth (HOSPITAL_COMMUNITY): Payer: Self-pay | Admitting: Cardiac Rehabilitation

## 2011-10-25 ENCOUNTER — Encounter (HOSPITAL_COMMUNITY): Payer: BC Managed Care – PPO

## 2011-10-27 ENCOUNTER — Encounter (HOSPITAL_COMMUNITY): Payer: BC Managed Care – PPO

## 2011-10-30 ENCOUNTER — Encounter (HOSPITAL_COMMUNITY): Payer: BC Managed Care – PPO

## 2011-11-01 ENCOUNTER — Encounter (HOSPITAL_COMMUNITY): Payer: BC Managed Care – PPO

## 2011-11-03 ENCOUNTER — Encounter (HOSPITAL_COMMUNITY): Payer: BC Managed Care – PPO

## 2011-11-06 ENCOUNTER — Encounter (HOSPITAL_COMMUNITY): Payer: BC Managed Care – PPO

## 2011-11-08 ENCOUNTER — Encounter (HOSPITAL_COMMUNITY): Payer: BC Managed Care – PPO

## 2011-11-10 ENCOUNTER — Encounter (HOSPITAL_COMMUNITY): Payer: BC Managed Care – PPO

## 2011-11-13 ENCOUNTER — Encounter (HOSPITAL_COMMUNITY): Payer: BC Managed Care – PPO

## 2011-11-15 ENCOUNTER — Encounter (HOSPITAL_COMMUNITY): Payer: BC Managed Care – PPO

## 2011-11-17 ENCOUNTER — Encounter (HOSPITAL_COMMUNITY): Payer: BC Managed Care – PPO

## 2011-11-20 ENCOUNTER — Encounter (HOSPITAL_COMMUNITY): Payer: BC Managed Care – PPO

## 2011-11-22 ENCOUNTER — Telehealth (HOSPITAL_COMMUNITY): Payer: Self-pay | Admitting: Cardiac Rehabilitation

## 2011-11-22 ENCOUNTER — Encounter (HOSPITAL_COMMUNITY): Payer: BC Managed Care – PPO

## 2011-11-23 NOTE — Progress Notes (Signed)
Addendum to Nutrition Section of Cardiac Rehab Program Progress Report  Pt following a step 2 Therapeutic Lifestyle Changes diet upon admission to Cardiac Rehab. No post-rehab information available to evaluate.

## 2011-11-23 NOTE — Progress Notes (Addendum)
Cardiac Rehabilitation Program Progress Report   Orientation:  09/21/2011 Graduate Date:   Discharge Date:  11/23/2011 # of sessions completed: 9  Cardiologist: Anne Fu Family MD:  Luther Redo Time:  0815  A.  Exercise Program:  Tolerates exercise @ 3.3 METS for 30 minutes, Poor attendance due to work schedule and travel and Discharged  B.  Mental Health:    C.  Education/Instruction/Skills  Accurately checks own pulse, Knows THR for exercise, Uses Perceived Exertion Scale and Attended 4 education classes    D.  Nutrition/Weight Control/Body Composition:  Patient has gained .5 kg BMI 31.5  *This section completed by Mickle Plumb, Andres Shad, RD, LDN, CDE  E.  Blood Lipids    No results found for this basename: CHOL     No results found for this basename: TRIG     No results found for this basename: HDL     No results found for this basename: CHOLHDL     No results found for this basename: LDLDIRECT      F.  Lifestyle Changes:    G.  Symptoms noted with exercise:  Resting hypertension and Exertional hypertension  Report Completed By:  Dayton Martes   Comments:  Electronically signed by Harriett Sine MS on Thursday November 23 2011 at 0654      Pt discharged from program for extended absences r/t out of town work assignments.  Pt attended 9/36 exercise sessions.  Pt had good participation in exercise and education classes  while in attendance.  Pt hypertensive at rest and on exertion,rehab report forwarded to Dr. Anne Fu 10/13/2011.  Telemetry-normal sinus rhythm.  Pt did not have hospital readmission during cardiac rehab period.  Thank you for the referral.

## 2011-11-24 ENCOUNTER — Encounter (HOSPITAL_COMMUNITY): Payer: BC Managed Care – PPO

## 2011-11-27 ENCOUNTER — Encounter (HOSPITAL_COMMUNITY): Payer: BC Managed Care – PPO | Attending: Cardiology

## 2011-11-27 DIAGNOSIS — Z5189 Encounter for other specified aftercare: Secondary | ICD-10-CM | POA: Insufficient documentation

## 2011-11-27 DIAGNOSIS — I359 Nonrheumatic aortic valve disorder, unspecified: Secondary | ICD-10-CM | POA: Insufficient documentation

## 2011-11-27 DIAGNOSIS — Z951 Presence of aortocoronary bypass graft: Secondary | ICD-10-CM | POA: Insufficient documentation

## 2011-11-27 DIAGNOSIS — E669 Obesity, unspecified: Secondary | ICD-10-CM | POA: Insufficient documentation

## 2011-11-27 DIAGNOSIS — I10 Essential (primary) hypertension: Secondary | ICD-10-CM | POA: Insufficient documentation

## 2011-11-27 DIAGNOSIS — I2510 Atherosclerotic heart disease of native coronary artery without angina pectoris: Secondary | ICD-10-CM | POA: Insufficient documentation

## 2011-11-27 DIAGNOSIS — E119 Type 2 diabetes mellitus without complications: Secondary | ICD-10-CM | POA: Insufficient documentation

## 2011-11-27 DIAGNOSIS — Z954 Presence of other heart-valve replacement: Secondary | ICD-10-CM | POA: Insufficient documentation

## 2011-11-27 DIAGNOSIS — I4891 Unspecified atrial fibrillation: Secondary | ICD-10-CM | POA: Insufficient documentation

## 2011-11-29 ENCOUNTER — Encounter (HOSPITAL_COMMUNITY): Payer: BC Managed Care – PPO

## 2011-12-01 ENCOUNTER — Encounter (HOSPITAL_COMMUNITY): Payer: BC Managed Care – PPO

## 2011-12-04 ENCOUNTER — Encounter (HOSPITAL_COMMUNITY): Payer: BC Managed Care – PPO

## 2011-12-06 ENCOUNTER — Encounter (HOSPITAL_COMMUNITY): Payer: BC Managed Care – PPO

## 2011-12-08 ENCOUNTER — Encounter (HOSPITAL_COMMUNITY): Payer: BC Managed Care – PPO

## 2011-12-11 ENCOUNTER — Encounter (HOSPITAL_COMMUNITY): Payer: BC Managed Care – PPO

## 2011-12-13 ENCOUNTER — Encounter (HOSPITAL_COMMUNITY): Payer: BC Managed Care – PPO

## 2011-12-15 ENCOUNTER — Encounter (HOSPITAL_COMMUNITY): Payer: BC Managed Care – PPO

## 2011-12-18 ENCOUNTER — Encounter (HOSPITAL_COMMUNITY): Payer: BC Managed Care – PPO

## 2011-12-20 ENCOUNTER — Encounter (HOSPITAL_COMMUNITY): Payer: BC Managed Care – PPO

## 2011-12-21 ENCOUNTER — Other Ambulatory Visit: Payer: Self-pay | Admitting: Cardiothoracic Surgery

## 2011-12-22 ENCOUNTER — Encounter (HOSPITAL_COMMUNITY): Payer: BC Managed Care – PPO

## 2011-12-25 ENCOUNTER — Encounter (HOSPITAL_COMMUNITY): Payer: BC Managed Care – PPO

## 2011-12-27 ENCOUNTER — Encounter (HOSPITAL_COMMUNITY): Payer: BC Managed Care – PPO

## 2011-12-29 ENCOUNTER — Encounter (HOSPITAL_COMMUNITY): Payer: BC Managed Care – PPO

## 2012-01-01 ENCOUNTER — Ambulatory Visit (HOSPITAL_COMMUNITY): Payer: BC Managed Care – PPO

## 2012-01-03 ENCOUNTER — Ambulatory Visit (HOSPITAL_COMMUNITY): Payer: BC Managed Care – PPO

## 2012-01-05 ENCOUNTER — Ambulatory Visit (HOSPITAL_COMMUNITY): Payer: BC Managed Care – PPO

## 2012-01-06 ENCOUNTER — Other Ambulatory Visit: Payer: Self-pay | Admitting: Cardiothoracic Surgery

## 2012-01-06 ENCOUNTER — Other Ambulatory Visit: Payer: Self-pay | Admitting: Surgical

## 2012-01-08 ENCOUNTER — Ambulatory Visit (HOSPITAL_COMMUNITY): Payer: BC Managed Care – PPO

## 2012-01-10 ENCOUNTER — Ambulatory Visit (HOSPITAL_COMMUNITY): Payer: BC Managed Care – PPO

## 2012-01-12 ENCOUNTER — Ambulatory Visit (HOSPITAL_COMMUNITY): Payer: BC Managed Care – PPO

## 2012-01-15 ENCOUNTER — Ambulatory Visit (HOSPITAL_COMMUNITY): Payer: BC Managed Care – PPO

## 2012-01-17 ENCOUNTER — Ambulatory Visit (HOSPITAL_COMMUNITY): Payer: BC Managed Care – PPO

## 2012-01-19 ENCOUNTER — Ambulatory Visit (HOSPITAL_COMMUNITY): Payer: BC Managed Care – PPO

## 2012-01-22 ENCOUNTER — Ambulatory Visit (HOSPITAL_COMMUNITY): Payer: BC Managed Care – PPO

## 2012-01-24 ENCOUNTER — Ambulatory Visit (HOSPITAL_COMMUNITY): Payer: BC Managed Care – PPO

## 2012-01-26 ENCOUNTER — Ambulatory Visit (HOSPITAL_COMMUNITY): Payer: BC Managed Care – PPO

## 2012-02-12 ENCOUNTER — Ambulatory Visit: Payer: BC Managed Care – PPO | Admitting: Thoracic Surgery (Cardiothoracic Vascular Surgery)

## 2012-03-11 ENCOUNTER — Encounter (INDEPENDENT_AMBULATORY_CARE_PROVIDER_SITE_OTHER): Payer: Self-pay | Admitting: General Surgery

## 2012-03-11 ENCOUNTER — Ambulatory Visit (INDEPENDENT_AMBULATORY_CARE_PROVIDER_SITE_OTHER): Payer: BC Managed Care – PPO | Admitting: General Surgery

## 2012-03-11 ENCOUNTER — Encounter: Payer: Self-pay | Admitting: Gastroenterology

## 2012-03-11 VITALS — BP 126/78 | HR 68 | Temp 97.2°F | Resp 16 | Ht 71.0 in | Wt 211.1 lb

## 2012-03-11 DIAGNOSIS — K625 Hemorrhage of anus and rectum: Secondary | ICD-10-CM

## 2012-03-11 NOTE — Progress Notes (Signed)
Subjective:     Patient ID: Kirk [name], male   DOB: [birth year], 66 y.o.   MRN: [id number]  HPI 66 yom who underwent cab/avr in December 2012 and was on coumadin/asa postop.  In about Feb/March he began having brbpr.  He also states he has had longer history of what he calls "pain in his tailbone".  He does note the blood is worse with straining.  He has not had much over the last week he states since attempting some symptomatic treatments for hemorrhoids.  He otherwise feels well after his surgery. He is eating well, having fairly normal bms otherwise.  He comes in today for evaluation for hemorrhoids.   Review of Systems  Constitutional: Negative for fever, chills and unexpected weight change.  HENT: Negative for hearing loss, congestion, sore throat, trouble swallowing and voice change.   Eyes: Negative for visual disturbance.  Respiratory: Negative for cough and wheezing.   Cardiovascular: Negative for chest pain, palpitations and leg swelling.  Gastrointestinal: Positive for blood in stool and anal bleeding. Negative for nausea, vomiting, abdominal pain, diarrhea, constipation, abdominal distention and rectal pain.  Genitourinary: Negative for hematuria and difficulty urinating.  Musculoskeletal: Negative for arthralgias.  Skin: Negative for rash and wound.  Neurological: Negative for seizures, syncope, weakness and headaches.  Hematological: Negative for adenopathy. Does not bruise/bleed easily.  Psychiatric/Behavioral: Negative for confusion.       Objective:   Physical Exam  Vitals reviewed. Constitutional: He appears well-developed and well-nourished.  Genitourinary: Rectal exam shows internal hemorrhoid, mass (left lateral hard mass I don't think is hemorrhoidal in nature, on anoscopy I was unable to see well due to bleeding from exam, this was also very tender) and tenderness. Rectal exam shows no external hemorrhoid, no fissure and anal tone normal. Guaiac positive stool  (blood noted on exam).       Assessment:     Rectal bleeding    Plan:     I am concerned this may not be due to hemorrhoids on his exam today.  He has mass that I can palpate but was unable to do adequate anoscopy due to pain which is also concerning.  I think next best step will be endoscopy asap.  May very well need eus also at same time if this is indeed a mass.I will send to Dr. Christella Hartigan for evaluation.

## 2012-03-12 ENCOUNTER — Telehealth (INDEPENDENT_AMBULATORY_CARE_PROVIDER_SITE_OTHER): Payer: Self-pay

## 2012-03-12 NOTE — Telephone Encounter (Signed)
LMOM just wanting to know about the pt's appt w/ Dr Larae Grooms. I ask for her to call me back.

## 2012-03-18 ENCOUNTER — Encounter: Payer: Self-pay | Admitting: Thoracic Surgery (Cardiothoracic Vascular Surgery)

## 2012-03-18 ENCOUNTER — Ambulatory Visit (INDEPENDENT_AMBULATORY_CARE_PROVIDER_SITE_OTHER): Payer: BC Managed Care – PPO | Admitting: Thoracic Surgery (Cardiothoracic Vascular Surgery)

## 2012-03-18 VITALS — BP 110/68 | HR 63 | Resp 16 | Ht 71.0 in | Wt 211.0 lb

## 2012-03-18 DIAGNOSIS — Z09 Encounter for follow-up examination after completed treatment for conditions other than malignant neoplasm: Secondary | ICD-10-CM

## 2012-03-18 DIAGNOSIS — I2510 Atherosclerotic heart disease of native coronary artery without angina pectoris: Secondary | ICD-10-CM

## 2012-03-18 DIAGNOSIS — Z954 Presence of other heart-valve replacement: Secondary | ICD-10-CM

## 2012-03-18 DIAGNOSIS — Z951 Presence of aortocoronary bypass graft: Secondary | ICD-10-CM

## 2012-03-18 DIAGNOSIS — Z952 Presence of prosthetic heart valve: Secondary | ICD-10-CM

## 2012-03-18 DIAGNOSIS — I359 Nonrheumatic aortic valve disorder, unspecified: Secondary | ICD-10-CM

## 2012-03-18 NOTE — Progress Notes (Signed)
[address]            [place] [address]          [phone]     CARDIOTHORACIC SURGERY OFFICE NOTE  Referring Provider is Donato Schultz, MD PCP is [name], MD   HPI:  Patient returns for followup status post aortic valve replacement and coronary artery bypass grafting on 08/16/2011. He was last seen here in the office on 10/09/2011. Since then he has continued to do very well from a cardiovascular standpoint.  He cannot remember all of his medications at this time and he did not bring his current medication list with him today. However, it sounds as though he has been taken off of both amiodarone and Coumadin. He has had some hematochezia recently for which she was seen by Dr Dwain Sarna and he has been referred to a gastroenterologist for possible flexible sigmoidoscopy or colonoscopy. He otherwise is doing exceptionally well. He has no pain in his chest. He has no shortness of breath and he notes his exercise tolerance is better than it has been for some time. He has had no cardiovascular-related symptoms.   Current Outpatient Prescriptions  Medication Sig Dispense Refill  . amitriptyline (ELAVIL) 25 MG tablet Take 25 mg by mouth at bedtime.        . ANUCORT-HC 25 MG suppository Twice daily.      Marland Kitchen aspirin EC 81 MG EC tablet Take 1 tablet (81 mg total) by mouth daily.      Marland Kitchen atorvastatin (LIPITOR) 20 MG tablet Take 40 mg by mouth daily.       Marland Kitchen azelastine (ASTELIN) 137 MCG/SPRAY nasal spray Place 1 spray into the nose 2 (two) times daily. Use in each nostril as directed      . beta carotene w/minerals (OCUVITE) tablet Take 1 tablet by mouth daily.        . cephALEXin (KEFLEX) 500 MG capsule Twice daily.      Marland Kitchen lisinopril-hydrochlorothiazide (PRINZIDE,ZESTORETIC) 10-12.5 MG per tablet Take 1 tablet by mouth daily.      . metFORMIN (GLUCOPHAGE) 1000 MG tablet Daily.      . metoprolol tartrate (LOPRESSOR) 25 MG tablet TAKE 1 TABLET TWICE  DAILY  60 tablet  1  . mometasone (NASONEX) 50 MCG/ACT nasal spray Place 2 sprays into the nose daily.      . Multiple Vitamin (MULTIVITAMIN) tablet Take 1 tablet by mouth daily.      . Vitamins-Lipotropics (LIPO-FLAVONOID PLUS) TABS Take 1 tablet by mouth 3 (three) times daily after meals.        Marland Kitchen amiodarone (PACERONE) 400 MG tablet Take 200 mg by mouth daily.       Marland Kitchen warfarin (COUMADIN) 5 MG tablet Take 1 tablet (5 mg total) by mouth daily at 6 PM.  60 tablet  1      Physical Exam:   BP 110/68  Pulse 63  Resp 16  Ht 5\' 11"  (1.803 m)  Wt 211 lb (95.709 kg)  BMI 29.43 kg/m2  SpO2 95%  General:  Well-appearing  Chest:   Clear to auscultation with symmetrical breath sounds  CV:   Regular rate and rhythm without murmur  Incisions:  Completely healed  Abdomen:  Soft and nontender  Extremities:  Warm and well-perfused without lower extremity edema  Diagnostic Tests:  n/a   Impression:  Patient is doing well more  than 6 months following her valve replacement and coronary artery bypass grafting.   Plan:  In the future the patient will call and return to see [place] as needed. All of his questions been addressed.   Salvatore Decent. Cornelius Moras, MD 03/18/2012 2:59 PM

## 2012-03-20 ENCOUNTER — Encounter: Payer: Self-pay | Admitting: Gastroenterology

## 2012-03-20 ENCOUNTER — Ambulatory Visit (INDEPENDENT_AMBULATORY_CARE_PROVIDER_SITE_OTHER): Payer: BC Managed Care – PPO | Admitting: Gastroenterology

## 2012-03-20 VITALS — BP 112/60 | HR 63 | Ht 71.0 in | Wt 210.0 lb

## 2012-03-20 DIAGNOSIS — K6289 Other specified diseases of anus and rectum: Secondary | ICD-10-CM

## 2012-03-20 DIAGNOSIS — Z860100 Personal history of colon polyps, unspecified: Secondary | ICD-10-CM | POA: Insufficient documentation

## 2012-03-20 DIAGNOSIS — R198 Other specified symptoms and signs involving the digestive system and abdomen: Secondary | ICD-10-CM

## 2012-03-20 HISTORY — DX: Other specified diseases of anus and rectum: K62.89

## 2012-03-20 HISTORY — DX: Personal history of colon polyps, unspecified: Z86.0100

## 2012-03-20 HISTORY — DX: Personal history of colonic polyps: Z86.010

## 2012-03-20 MED ORDER — PEG-KCL-NACL-NASULF-NA ASC-C 100 G PO SOLR: 1.0000 | Freq: Once | ORAL | Status: DC

## 2012-03-20 NOTE — Assessment & Plan Note (Addendum)
Rectal mass may be responsible for limited rectal bleeding. Neoplasm needs to be ruled out. Absence of tenderness renders a perirectal abscess unlikely.  While he may have hemorrhoids, I don't think this accounts for findings on rectal exam.  Recommendations #1 colonoscopy

## 2012-03-20 NOTE — Progress Notes (Signed)
History of Present Illness: Pleasant 66 year old white male with history of coronary artery disease, status post CABG, status post aortic valve replacement, diabetes and congestive heart failure referred at the request of Dr. Dwain Sarna for evaluation of a possible rectal mass. For several months he's had minimal painless rectal bleeding. This is characterized by small amounts of blood on the tissue or in the toilet water. He was seen by Dr. Dwain Sarna where possible mass in the anal canal was palpated. He was placed on antibiotics. With antibiotics he developed diarrhea which has since subsided after discontinuing antibiotics. He denies rectal pain. He occasionally has lower abdominal discomfort. He has a history of colon polyps. Last examination 4 years ago in [facility] apparently demonstrated benign polyps.    Past Medical History  Diagnosis Date  . Elevated cholesterol   . ED (erectile dysfunction)   . Kidney stones   . Aortic stenosis   . Coronary artery disease     aortic stenosis, CAD  . Angina     6 wks. ago  . Heart murmur   . Shortness of breath   . GERD (gastroesophageal reflux disease)   . Arthritis     1995- cerv. fusion- [facility]  . Diabetes mellitus   . CHF (congestive heart failure)   . Rectal bleeding   . Blood in stool   . Thrombosed hemorrhoids    Past Surgical History  Procedure Date  . Lithotripsy 2001 AND 2002    DR PETERSON  . C4-6 fusion 1995    DR STERN  . Aortic valve replacement 08/11/2011    Procedure: AORTIC VALVE REPLACEMENT (AVR);  Surgeon: Purcell Nails, MD;  Location: [facility] OR;  Service: Open Heart Surgery;  Laterality: N/A;  . Coronary artery bypass graft 08/16/2011    Procedure: CORONARY ARTERY BYPASS GRAFTING (CABG);  Surgeon: Purcell Nails, MD;  Location: [facility] OR;  Service: Open Heart Surgery;  Laterality: N/A;  CABG times one using left internal mammary artery  . Anal fissure repair     patient unsure of date   family history includes Cancer in his  mother and Heart attack in his father.  There is no history of Anesthesia problems, and Hypotension, and Malignant hyperthermia, and Pseudochol deficiency, . Current Outpatient Prescriptions  Medication Sig Dispense Refill  . amitriptyline (ELAVIL) 25 MG tablet Take 25 mg by mouth at bedtime.        Marland Kitchen aspirin EC 81 MG EC tablet Take 1 tablet (81 mg total) by mouth daily.      Marland Kitchen atorvastatin (LIPITOR) 20 MG tablet Take 40 mg by mouth daily.       Marland Kitchen azelastine (ASTELIN) 137 MCG/SPRAY nasal spray Place 1 spray into the nose 2 (two) times daily. Use in each nostril as directed      . lisinopril-hydrochlorothiazide (PRINZIDE,ZESTORETIC) 10-12.5 MG per tablet Take 1 tablet by mouth daily.      . metFORMIN (GLUCOPHAGE) 1000 MG tablet Daily.      . metoprolol tartrate (LOPRESSOR) 25 MG tablet TAKE 1 TABLET TWICE DAILY  60 tablet  1  . mometasone (NASONEX) 50 MCG/ACT nasal spray Place 2 sprays into the nose daily.      . Multiple Vitamin (MULTIVITAMIN) tablet Take 1 tablet by mouth daily.      . Vitamins-Lipotropics (LIPO-FLAVONOID PLUS) TABS Take 1 tablet by mouth 3 (three) times daily after meals.        . ANUCORT-HC 25 MG suppository Twice daily.      Marland Kitchen  beta carotene w/minerals (OCUVITE) tablet Take 1 tablet by mouth daily.        . cephALEXin (KEFLEX) 500 MG capsule Twice daily.       Allergies as of 03/20/2012 - Review Complete 03/20/2012  Allergen Reaction Noted  . Cat hair extract Shortness Of Breath and Swelling 09/25/2011    reports that he has never smoked. He has never used smokeless tobacco. He reports that he drinks alcohol. He reports that he does not use illicit drugs.     Review of Systems: Pertinent positive and negative review of systems were noted in the above HPI section. All other review of systems were otherwise negative.  Vital signs were reviewed in today's medical record Physical Exam: General: Well developed , well nourished, no acute distress Head: Normocephalic and  atraumatic Eyes:  sclerae anicteric, EOMI Ears: Normal auditory acuity Mouth: No deformity or lesions Neck: Supple, no masses or thyromegaly Lungs: Clear throughout to auscultation Heart: Regular rate and rhythm; no rubs or bruits. There is a 1/6 early systolic murmur Abdomen: Soft, non tender and non distended. No masses, hepatosplenomegaly or hernias noted. Normal Bowel sounds Rectal: On exam there is a firm mass that is partially circumferential just inside the anal canal. There is no tenderness. Stool is Hemoccult negative Musculoskeletal: Symmetrical with no gross deformities  Skin: No lesions on visible extremities Pulses:  Normal pulses noted Extremities: No clubbing, cyanosis, edema or deformities noted Neurological: Alert oriented x 4, grossly nonfocal Cervical Nodes:  No significant cervical adenopathy Inguinal Nodes: No significant inguinal adenopathy Psychological:  Alert and cooperative. Normal mood and affect

## 2012-03-20 NOTE — Assessment & Plan Note (Signed)
Last colonoscopy approximately 4 years ago demonstrated polyps, by history because of his rectal mass I will proceed with full colonoscopy

## 2012-03-20 NOTE — Patient Instructions (Addendum)
Colonoscopy A colonoscopy is an exam to evaluate your entire colon. In this exam, your colon is cleansed. A long fiberoptic tube is inserted through your rectum and into your colon. The fiberoptic scope (endoscope) is a long bundle of enclosed and very flexible fibers. These fibers transmit light to the area examined and send images from that area to your caregiver. Discomfort is usually minimal. You may be given a drug to help you sleep (sedative) during or prior to the procedure. This exam helps to detect lumps (tumors), polyps, inflammation, and areas of bleeding. Your caregiver may also take a small piece of tissue (biopsy) that will be examined under a microscope. LET YOUR CAREGIVER KNOW ABOUT:   Allergies to food or medicine.   Medicines taken, including vitamins, herbs, eyedrops, over-the-counter medicines, and creams.   Use of steroids (by mouth or creams).   Previous problems with anesthetics or numbing medicines.   History of bleeding problems or blood clots.   Previous surgery.   Other health problems, including diabetes and kidney problems.   Possibility of pregnancy, if this applies.  BEFORE THE PROCEDURE   A clear liquid diet may be required for 2 days before the exam.   Ask your caregiver about changing or stopping your regular medications.   Liquid injections (enemas) or laxatives may be required.   A large amount of electrolyte solution may be given to you to drink over a short period of time. This solution is used to clean out your colon.   You should be present 60 minutes prior to your procedure or as directed by your caregiver.  AFTER THE PROCEDURE   If you received a sedative or pain relieving medication, you will need to arrange for someone to drive you home.   Occasionally, there is a little blood passed with the first bowel movement. Do not be concerned.  FINDING OUT THE RESULTS OF YOUR TEST Not all test results are available during your visit. If your test  results are not back during the visit, make an appointment with your caregiver to find out the results. Do not assume everything is normal if you have not heard from your caregiver or the medical facility. It is important for you to follow up on all of your test results. HOME CARE INSTRUCTIONS   It is not unusual to pass moderate amounts of gas and experience mild abdominal cramping following the procedure. This is due to air being used to inflate your colon during the exam. Walking or a warm pack on your belly (abdomen) may help.   You may resume all normal meals and activities after sedatives and medicines have worn off.   Only take over-the-counter or prescription medicines for pain, discomfort, or fever as directed by your caregiver. Do not use aspirin or blood thinners if a biopsy was taken. Consult your caregiver for medicine usage if biopsies were taken.  SEEK IMMEDIATE MEDICAL CARE IF:   You have a fever.   You pass large blood clots or fill a toilet with blood following the procedure. This may also occur 10 to 14 days following the procedure. This is more likely if a biopsy was taken.   You develop abdominal pain that keeps getting worse and cannot be relieved with medicine.  Document Released: 08/18/2000 Document Revised: 08/10/2011 Document Reviewed: 04/02/2008 [employer] Patient Information 2012 [employer]. 

## 2012-03-21 ENCOUNTER — Other Ambulatory Visit: Payer: Self-pay

## 2012-04-04 DIAGNOSIS — C210 Malignant neoplasm of anus, unspecified: Secondary | ICD-10-CM

## 2012-04-04 HISTORY — DX: Malignant neoplasm of anus, unspecified: C21.0

## 2012-04-09 ENCOUNTER — Ambulatory Visit (AMBULATORY_SURGERY_CENTER): Payer: BC Managed Care – PPO | Admitting: Gastroenterology

## 2012-04-09 ENCOUNTER — Other Ambulatory Visit: Payer: Self-pay | Admitting: Gastroenterology

## 2012-04-09 ENCOUNTER — Encounter: Payer: Self-pay | Admitting: Gastroenterology

## 2012-04-09 VITALS — BP 128/69 | HR 60 | Temp 98.0°F | Resp 22 | Ht 71.0 in | Wt 210.0 lb

## 2012-04-09 DIAGNOSIS — K6289 Other specified diseases of anus and rectum: Secondary | ICD-10-CM

## 2012-04-09 DIAGNOSIS — C218 Malignant neoplasm of overlapping sites of rectum, anus and anal canal: Secondary | ICD-10-CM

## 2012-04-09 DIAGNOSIS — C801 Malignant (primary) neoplasm, unspecified: Secondary | ICD-10-CM

## 2012-04-09 HISTORY — DX: Malignant (primary) neoplasm, unspecified: C80.1

## 2012-04-09 MED ORDER — SODIUM CHLORIDE 0.9 % IV SOLN: 500.0000 mL | INTRAVENOUS | Status: DC

## 2012-04-09 NOTE — Op Note (Signed)
[facility] [address] [place], [place]  [address]  COLONOSCOPY PROCEDURE REPORT  PATIENT:  Kirk [name], Kirk [name]  MR#:  [id number] BIRTHDATE:  [birth year], 66 yrs. old  GENDER:  male ENDOSCOPIST:  Barbette Hair. Arlyce Dice, MD REF. BY:  Emelia Loron, M.D. PROCEDURE DATE:  04/09/2012 PROCEDURE:  Colonoscopy with biopsy ASA CLASS:  Class II INDICATIONS:  rectal bleeding Mass on rectal exam MEDICATIONS:   MAC sedation, administered by CRNA propofol 250mg IV  DESCRIPTION OF PROCEDURE:   After the risks benefits and alternatives of the procedure were thoroughly explained, informed consent was obtained.  Digital rectal exam was performed and revealed rectal mass.  Palpable hard mass, partially circumferential, just inside anal canal The LB [id number] endoscope was introduced through the anus and advanced to the cecum, which was identified by both the appendix and ileocecal valve, without limitations.  The quality of the prep was Moviprep fair.  The instrument was then slowly withdrawn as the colon was fully examined. <<PROCEDUREIMAGES>>  FINDINGS:  A mass was found in the rectum. Bleeding, ulcerated, friable mass best seen on retroflexed view just inside the anal canal measuring 4-5cm and extending at least 2 cm proximally. Biopsies taken (see image10 and image7).  other finding in the rectum. Infiltrated fold with polyploid appearing mucosa, hard base, at 4-5cm from anus. Bxs taken (see image5).  This was otherwise a normal examination of the colon (see image3). Retroflexed views in the rectum revealed rectal mass.    The time to cecum =  1) 4.50  minutes. The scope was then withdrawn in  1) 12.75  minutes from the cecum and the procedure completed. COMPLICATIONS:  None ENDOSCOPIC IMPRESSION: 1) Malignant Mass in the rectum 2) Otherwise normal examination RECOMMENDATIONS:Staging with rectal ultrasound, CT Surgery  REPEAT EXAM:  1  year  ______________________________ Barbette Hair. Arlyce Dice, MD  CC:  Elias Else, MD  n. Rosalie Doctor:   Barbette Hair. Juana Haralson at 04/09/2012 04:02 PM  Marko Plume, [phone]

## 2012-04-09 NOTE — Patient Instructions (Addendum)

## 2012-04-09 NOTE — Progress Notes (Signed)
Patient did not experience any of the following events: a burn prior to discharge; a fall within the facility; wrong site/side/patient/procedure/implant event; or a hospital transfer or hospital admission upon discharge from the facility. (G8907) Patient did not have preoperative order for IV antibiotic SSI prophylaxis. (G8918)  

## 2012-04-10 ENCOUNTER — Other Ambulatory Visit: Payer: Self-pay

## 2012-04-10 ENCOUNTER — Telehealth: Payer: Self-pay

## 2012-04-10 ENCOUNTER — Telehealth: Payer: Self-pay | Admitting: Oncology

## 2012-04-10 ENCOUNTER — Other Ambulatory Visit: Payer: Self-pay | Admitting: Gastroenterology

## 2012-04-10 ENCOUNTER — Other Ambulatory Visit (INDEPENDENT_AMBULATORY_CARE_PROVIDER_SITE_OTHER): Payer: BC Managed Care – PPO

## 2012-04-10 ENCOUNTER — Telehealth: Payer: Self-pay | Admitting: *Deleted

## 2012-04-10 DIAGNOSIS — K6289 Other specified diseases of anus and rectum: Secondary | ICD-10-CM

## 2012-04-10 DIAGNOSIS — C20 Malignant neoplasm of rectum: Secondary | ICD-10-CM

## 2012-04-10 DIAGNOSIS — R198 Other specified symptoms and signs involving the digestive system and abdomen: Secondary | ICD-10-CM

## 2012-04-10 LAB — COMPREHENSIVE METABOLIC PANEL
AST: 29 U/L (ref 0–37)
Alkaline Phosphatase: 53 U/L (ref 39–117)
BUN: 12 mg/dL (ref 6–23)
Calcium: 10.3 mg/dL (ref 8.4–10.5)
Chloride: 103 mEq/L (ref 96–112)
Creatinine, Ser: 1.2 mg/dL (ref 0.4–1.5)
Glucose, Bld: 106 mg/dL — ABNORMAL HIGH (ref 70–99)

## 2012-04-10 NOTE — Telephone Encounter (Signed)
Dx- Anal Ca. NP packet mailed out.

## 2012-04-10 NOTE — Telephone Encounter (Signed)
  Follow up Call-  Call back number 04/09/2012  Post procedure Call Back phone  # [phone]  Permission to leave phone message Yes     Patient questions:  Do you have a fever, pain , or abdominal swelling? no Pain Score  0 *  Have you tolerated food without any problems? yes  Have you been able to return to your normal activities? yes  Do you have any questions about your discharge instructions: Diet   no Medications  no Follow up visit  no  Do you have questions or concerns about your Care? no  Actions: * If pain score is 4 or above: No action needed, pain <4.

## 2012-04-10 NOTE — Telephone Encounter (Signed)
Pt scheduled for CT of CAP at [facility] CT 04/11/12 @9 :30am. Pt to be NPO after midnight drink 1 bottle of contrast at 7:30am the 2nd bottle of contrast at 8:30am. Pt to hold metformin tomorrow and 48 hours after scan. Pt to have cmet drawn today. Pts wife aware of appt date, time, and instructions.

## 2012-04-10 NOTE — Telephone Encounter (Signed)
Pt needs to be instructed for lower eus

## 2012-04-10 NOTE — Telephone Encounter (Signed)
Message copied by Chrystie Nose on Wed Apr 10, 2012  8:23 AM ------      Message from: Melvia Heaps D      Created: Tue Apr 09, 2012  5:02 PM      Regarding: RE: Question       Chest/abd/pelvis.      ----- Message -----         From: Lily Lovings, RN         Sent: 04/09/2012   4:43 PM           To: Louis Meckel, MD      Subject: Question                                                 Dr. Arlyce Dice,            On the procedure report you mention CT. Do you want CT of abdomen and pelvis? Please advise.            Thanks,      Selinda Michaels

## 2012-04-10 NOTE — Telephone Encounter (Signed)
Message copied by Donata Duff on Wed Apr 10, 2012  8:30 AM ------      Message from: Rob Bunting P      Created: Wed Apr 10, 2012  8:16 AM      Regarding: RE: Staging with rectal ultrasound       Anjenette Gerbino,            Can we put him in for Lower EUS this Friday, noon case.  Dx: rectal cancer staging.  Radial only.            Thanks                  ----- Message -----         From: Donata Duff, CMA         Sent: 04/09/2012   4:24 PM           To: Rachael Fee, MD      Subject: FW: Staging with rectal ultrasound                                   ----- Message -----         From: Lily Lovings, RN         Sent: 04/09/2012   4:08 PM           To: Rachael Fee, MD, Donata Duff, CMA      Subject: Staging with rectal ultrasound                           Dr. Christella Hartigan,            Dr. Arlyce Dice wants this pt to have a rectal ultrasound for staging. Printed report placed on your desk for review.            Thanks,      Selinda Michaels RN

## 2012-04-10 NOTE — Telephone Encounter (Signed)
Spoke with patient's wife (patient wanted her to take the information) regarding upcoming appointment with Dr. Mitzi Hansen and Dr. Truett Perna.  Explained role of RN navigator and gave contact phone numbers.  She was appreciative of the call and verified appointment times/places for CT, Dr. Mitzi Hansen, and Dr. Truett Perna.  Will continue to follow.

## 2012-04-10 NOTE — Telephone Encounter (Signed)
Message copied by Donata Duff on Wed Apr 10, 2012 12:27 PM ------      Message from: Rob Bunting P      Created: Wed Apr 10, 2012 11:28 AM       I agree, EUS is not necessary for staging of squamous cell cancer.            [name],      Can you cancel his EUS for this Friday.                  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  I will let Dr. Dwain Sarna decide whether he needs to see him or whether he should  be evaluated first by radiation oncology and oncology  since therapy is probably nonsurgical. We can hold off EUS.      ----- Message -----         From: Cira Rue, RN         Sent: 04/09/2012   5:01 PM           To: Louis Meckel, MD, [name], MD            Per Dr. Truett Perna, we will go ahead and refer to Dr. Mitzi Hansen as well.       ----- Message -----         From: Louis Meckel, MD         Sent: 04/09/2012   4:14 PM           To: Emelia Loron, MD, Cira Rue, RN, #            [name],      This pt has newly diagnosed rectal cancer (path pending but colonoscopy diagnostic).  Please arrange for him to see Dr. Truett Perna and place him on the list for conference.  I will be out of town for the next 2 weeks.

## 2012-04-10 NOTE — Telephone Encounter (Signed)
Pt has been instructed and meds reviewed printed instructions left at the front desk for pt pick up

## 2012-04-10 NOTE — Telephone Encounter (Signed)
Kirk [name] was notified at [facility] endo to cx procedure.

## 2012-04-10 NOTE — Telephone Encounter (Signed)
Message copied by Donata Duff on Wed Apr 10, 2012  8:31 AM ------      Message from: Rob Bunting P      Created: Wed Apr 10, 2012  8:16 AM      Regarding: RE: Staging with rectal ultrasound       Nykira Reddix,            Can we put him in for Lower EUS this Friday, noon case.  Dx: rectal cancer staging.  Radial only.            Thanks                  ----- Message -----         From: Donata Duff, CMA         Sent: 04/09/2012   4:24 PM           To: Rachael Fee, MD      Subject: FW: Staging with rectal ultrasound                                   ----- Message -----         From: Lily Lovings, RN         Sent: 04/09/2012   4:08 PM           To: Rachael Fee, MD, Donata Duff, CMA      Subject: Staging with rectal ultrasound                           Dr. Christella Hartigan,            Dr. Arlyce Dice wants this pt to have a rectal ultrasound for staging. Printed report placed on your desk for review.            Thanks,      Selinda Michaels RN

## 2012-04-11 ENCOUNTER — Ambulatory Visit (INDEPENDENT_AMBULATORY_CARE_PROVIDER_SITE_OTHER)
Admission: RE | Admit: 2012-04-11 | Discharge: 2012-04-11 | Disposition: A | Discharge status: Home / Self Care | Payer: BC Managed Care – PPO | Source: Ambulatory Visit | Attending: Gastroenterology | Admitting: Gastroenterology

## 2012-04-11 ENCOUNTER — Encounter: Payer: Self-pay | Admitting: *Deleted

## 2012-04-11 ENCOUNTER — Telehealth: Payer: Self-pay | Admitting: Oncology

## 2012-04-11 DIAGNOSIS — K6289 Other specified diseases of anus and rectum: Secondary | ICD-10-CM

## 2012-04-11 DIAGNOSIS — R198 Other specified symptoms and signs involving the digestive system and abdomen: Secondary | ICD-10-CM

## 2012-04-11 LAB — GLUCOSE, CAPILLARY: Glucose-Capillary: 91 mg/dL (ref 70–99)

## 2012-04-11 MED ORDER — IOHEXOL 300 MG/ML  SOLN
100.0000 mL | Freq: Once | INTRAMUSCULAR | Status: AC | PRN
  Administered 2012-04-11: 100 mL via INTRAVENOUS

## 2012-04-11 NOTE — Telephone Encounter (Signed)
NP packet mailed out.

## 2012-04-12 ENCOUNTER — Ambulatory Visit (HOSPITAL_COMMUNITY)
Admission: RE | Admit: 2012-04-12 | Payer: BC Managed Care – PPO | Source: Ambulatory Visit | Admitting: Gastroenterology

## 2012-04-12 ENCOUNTER — Encounter (HOSPITAL_COMMUNITY): Admission: RE | Payer: Self-pay | Source: Ambulatory Visit

## 2012-04-12 ENCOUNTER — Ambulatory Visit
Admission: RE | Admit: 2012-04-12 | Discharge: 2012-04-12 | Disposition: A | Discharge status: Home / Self Care | Payer: BC Managed Care – PPO | Source: Ambulatory Visit | Attending: Radiation Oncology | Admitting: Radiation Oncology

## 2012-04-12 ENCOUNTER — Encounter: Payer: Self-pay | Admitting: Radiation Oncology

## 2012-04-12 VITALS — BP 113/65 | HR 72 | Temp 98.1°F | Resp 20 | Ht 71.0 in | Wt 210.0 lb

## 2012-04-12 DIAGNOSIS — C801 Malignant (primary) neoplasm, unspecified: Secondary | ICD-10-CM

## 2012-04-12 DIAGNOSIS — Z79899 Other long term (current) drug therapy: Secondary | ICD-10-CM | POA: Insufficient documentation

## 2012-04-12 DIAGNOSIS — I509 Heart failure, unspecified: Secondary | ICD-10-CM | POA: Insufficient documentation

## 2012-04-12 DIAGNOSIS — Z510 Encounter for antineoplastic radiation therapy: Secondary | ICD-10-CM | POA: Insufficient documentation

## 2012-04-12 DIAGNOSIS — C218 Malignant neoplasm of overlapping sites of rectum, anus and anal canal: Secondary | ICD-10-CM

## 2012-04-12 DIAGNOSIS — I2510 Atherosclerotic heart disease of native coronary artery without angina pectoris: Secondary | ICD-10-CM | POA: Insufficient documentation

## 2012-04-12 DIAGNOSIS — Z951 Presence of aortocoronary bypass graft: Secondary | ICD-10-CM | POA: Insufficient documentation

## 2012-04-12 DIAGNOSIS — E119 Type 2 diabetes mellitus without complications: Secondary | ICD-10-CM | POA: Insufficient documentation

## 2012-04-12 DIAGNOSIS — D709 Neutropenia, unspecified: Secondary | ICD-10-CM | POA: Insufficient documentation

## 2012-04-12 DIAGNOSIS — Z85048 Personal history of other malignant neoplasm of rectum, rectosigmoid junction, and anus: Secondary | ICD-10-CM | POA: Insufficient documentation

## 2012-04-12 HISTORY — DX: Malignant neoplasm of overlapping sites of rectum, anus and anal canal: C21.8

## 2012-04-12 SURGERY — ULTRASOUND, LOWER GI TRACT, ENDOSCOPIC
Anesthesia: Moderate Sedation

## 2012-04-12 NOTE — Progress Notes (Signed)
Pt denies pain, nausea, diarrhea, blood in stool, fatigue, loss of appetite. He was having intermittent diarrhea, abdominal pain, but states after stopping Metformin for ct scan those issues have resolved. Pt states his dr is changing that med, not sure what he'll take instead.

## 2012-04-12 NOTE — Progress Notes (Signed)
Please see the Nurse Progress Note in the MD Initial Consult Encounter for this patient. 

## 2012-04-13 NOTE — Progress Notes (Signed)
[facility] Radiation Oncology NEW PATIENT EVALUATION  Name: Kirk [name] MRN: [id number]  Date:   04/12/2012           DOB: 1946/03/16  Status: outpatient   CC: [name], MD  [name], MD    REFERRING PHYSICIAN: [name], MD   DIAGNOSIS: The encounter diagnosis was Malignant neoplasm of other sites of rectum, rectosigmoid junction, and anus.    HISTORY OF PRESENT ILLNESS:  Kirk [name] is a 66 y.o. male who is seen today for an initial consultation visit. The patient has a history of some rectal bleeding which was essentially painless. He states that this began in April this year. He initially thought that this was related to his medicines and also was then know so that I evaluated for possible hemorrhoids. The patient was seen by Dr. Dwain Sarna with a possible mass felt in the anal canal on exam. The patient therefore was seen by Dr. Arlyce Dice who underwent a colonoscopy. A mass was found within the distal rectum which was bleeding and ulcerated. This is present described as being just inside the anal canal measuring 4-5 cm and extending at least 2 cm proximally. Biopsies were taken. Otherwise the exam was normal. 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 The the biopsy specimen was positive for P. 16, consistent with squamous cell carcinoma of anal origin.  The patient has undergone a CT scan of the chest abdomen and pelvis. Within the lungs, prior granulomatous disease is seen. There are small, nonspecific pulmonary nodules which are identified measuring up to 4.4 mm. These are felt to possibly be related to prior granulomatous disease. However metastatic disease is considered but last favored. Within the abdomen there are multiple enlarged retroperitoneal lymph nodes which are identified within the upper abdomen. Metastatic adenopathy cannot be ruled out. Index para-aortic lymph nodes measure 1.3 cm and 1.2 cm. There is  some wall thickening involving the rectum which is noted measuring up to 1.7 cm. This is present within the distal rectum. No significant pelvic or inguinal lymphadenopathy present.  The patient at this time denies any other major symptoms with bleeding really getting his dominant complaint. He denies any change in bowel movement in terms of caliber, constipation or diarrhea. He denies any pain at this time to me today.  PREVIOUS RADIATION THERAPY: No   PAST MEDICAL HISTORY:  has a past medical history of Elevated cholesterol; ED (erectile dysfunction); Aortic stenosis; Coronary artery disease; Angina; Heart murmur; Shortness of breath; Arthritis; CHF (congestive heart failure); Rectal bleeding; Blood in stool; Thrombosed hemorrhoids; ED (erectile dysfunction); Kidney stones; Cancer (04/09/12); and Diabetes mellitus.     PAST SURGICAL HISTORY:  Past Surgical History  Procedure Date  . Lithotripsy 2001 AND 2002    DR PETERSON  . C4-6 fusion 1995    DR STERN  . Aortic valve replacement 08/11/2011    Procedure: AORTIC VALVE REPLACEMENT (AVR);  Surgeon: Purcell Nails, MD;  Location: [facility] OR;  Service: Open Heart Surgery;  Laterality: N/A;  . Coronary artery bypass graft 08/16/2011    Procedure: CORONARY ARTERY BYPASS GRAFTING (CABG);  Surgeon: Purcell Nails, MD;  Location: [facility] OR;  Service: Open Heart Surgery;  Laterality: N/A;  CABG times one using left internal mammary artery  . Anal fissure repair     patient unsure of date     FAMILY HISTORY: family history includes Cancer in his mother and Heart attack in his father.  There is no history of Anesthesia problems, and Hypotension, and Malignant hyperthermia, and Pseudochol deficiency, .   SOCIAL HISTORY:  reports that he has never smoked. He has never used smokeless tobacco. He reports that he drinks alcohol. He reports that he does not use illicit drugs.   ALLERGIES: Cat hair extract   MEDICATIONS:  Current Outpatient Prescriptions    Medication Sig Dispense Refill  . amitriptyline (ELAVIL) 25 MG tablet Take 25 mg by mouth at bedtime.        Marland Kitchen aspirin EC 81 MG EC tablet Take 1 tablet (81 mg total) by mouth daily.      Marland Kitchen atorvastatin (LIPITOR) 20 MG tablet Take 80 mg by mouth daily.       Marland Kitchen atorvastatin (LIPITOR) 80 MG tablet Take 80 mg by mouth daily.      Marland Kitchen azelastine (ASTELIN) 137 MCG/SPRAY nasal spray Place 1 spray into the nose 2 (two) times daily. Use in each nostril as directed      . lisinopril-hydrochlorothiazide (PRINZIDE,ZESTORETIC) 10-12.5 MG per tablet Take 1 tablet by mouth daily.      . metFORMIN (GLUCOPHAGE) 1000 MG tablet 1,000 mg 2 (two) times daily with a meal.       . metoprolol tartrate (LOPRESSOR) 25 MG tablet TAKE 1 TABLET TWICE DAILY  60 tablet  1  . mometasone (NASONEX) 50 MCG/ACT nasal spray Place 2 sprays into the nose daily.      . Multiple Vitamin (MULTIVITAMIN) tablet Take 1 tablet by mouth daily.      . Vitamins-Lipotropics (LIPO-FLAVONOID PLUS) TABS Take 1 tablet by mouth 3 (three) times daily after meals.           REVIEW OF SYSTEMS:  Pertinent items are noted in HPI.    PHYSICAL EXAM:  height is 5\' 11"  (1.803 m) and weight is 210 lb (95.255 kg). His oral temperature is 98.1 F (36.7 C). His blood pressure is 113/65 and his pulse is 72. His respiration is 20.   General: Well-developed, in no acute distress HEENT: Normocephalic, atraumatic; oral cavity clear Neck: Supple without any lymphadenopathy Cardiovascular: Regular rate and rhythm Respiratory: Clear to auscultation bilaterally GI: Soft, nontender, normal bowel sounds Extremities: No edema present Neuro: No focal deficits Lymph nodes: No palpable inguinal lymph nodes present Rectal: On rectal exam no external lesions. Within the proximal anal canal/distal rectum a mass is present within the lateral aspect on the left, extending on my exam from approximately the 6 to the 12:00 position. 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 Given the pathology, I would favor treating the patient is an anal cancer patient and this is what we have typically done for such cases at this location. Clinically this appears to represent a T2 N0 tumor based on his local/regional disease. I believe that further workup is necessary  to clarify the possible impact of the abdominal retroperitoneal lymphadenopathy. This would represent, formally, non-regional lymph nodes corresponding to M1 disease if positive. Small nonspecific pulmonary nodules are also present which may be difficult to further characterize at this time given their size.    PLAN: I recommend a PET scan to be completed at this time. This would help to further characterize his local disease and also offer additional information with regards to some of the findings seen on the CT scan regarding more distant disease. I discussed this with the patient and we will have this done as soon as possible next week.  The patient is scheduled to see Dr. Truett Perna next week with regards to medical oncology.  I did discuss with the patient the treatment options in general with respect to anal cancer and how this differs from rectal cancer. We therefore discussed a typical course of chemoradiotherapy and what this would entail in terms of the length of treatment, possible side effects, and risks, as well as the possible positive outcomes we can achieve with this. All the patient's questions  were answered. At this time we do need to gather some additional information before proceeding with this plan and the patient is aware of this. If the retroperitoneal lymph nodes are positive on PET scan, one option would be to proceed with chemoradiation and expand the typical radiation fields more proximally. Given that I would treat the patient with IMRT on our Tomotherapy unit, I believe that this would be feasible without prohibitive toxicity. However, this would certainly put the patient at substantial increased risk for distant failure. Other options/approaches could also be explored.  The patient at this time is planning to go out of town from 04/20/2012 through 04/27/2012. This is not a critical trip and the patient is willing to alter his plans if necessary. This may work out okay in terms of timing, although we may need to have the patient undergo simulation prior to this trip so that I can be working on his radiation plan while he is away. The patient will also further discuss this with Dr. Truett Perna. Again this is dependent on [place] proceeding with chemoradiation.    I spent 60 minutes minutes face to face with the patient and more than 50% of that time was spent in counseling and/or coordination of care.

## 2012-04-17 ENCOUNTER — Encounter (HOSPITAL_COMMUNITY): Payer: Self-pay

## 2012-04-17 ENCOUNTER — Encounter (HOSPITAL_COMMUNITY)
Admission: RE | Admit: 2012-04-17 | Discharge: 2012-04-17 | Disposition: A | Discharge status: Home / Self Care | Payer: BC Managed Care – PPO | Source: Ambulatory Visit | Attending: Radiation Oncology | Admitting: Radiation Oncology

## 2012-04-17 DIAGNOSIS — C770 Secondary and unspecified malignant neoplasm of lymph nodes of head, face and neck: Secondary | ICD-10-CM | POA: Insufficient documentation

## 2012-04-17 DIAGNOSIS — C210 Malignant neoplasm of anus, unspecified: Secondary | ICD-10-CM | POA: Insufficient documentation

## 2012-04-17 DIAGNOSIS — C772 Secondary and unspecified malignant neoplasm of intra-abdominal lymph nodes: Secondary | ICD-10-CM | POA: Insufficient documentation

## 2012-04-17 DIAGNOSIS — C218 Malignant neoplasm of overlapping sites of rectum, anus and anal canal: Secondary | ICD-10-CM

## 2012-04-17 MED ORDER — FLUDEOXYGLUCOSE F - 18 (FDG) INJECTION
17.6000 | Freq: Once | INTRAVENOUS | Status: AC | PRN
  Administered 2012-04-17: 17.6 via INTRAVENOUS

## 2012-04-18 ENCOUNTER — Telehealth: Payer: Self-pay | Admitting: *Deleted

## 2012-04-18 ENCOUNTER — Ambulatory Visit (HOSPITAL_BASED_OUTPATIENT_CLINIC_OR_DEPARTMENT_OTHER): Payer: BC Managed Care – PPO | Admitting: Oncology

## 2012-04-18 ENCOUNTER — Other Ambulatory Visit: Payer: BC Managed Care – PPO | Admitting: Lab

## 2012-04-18 ENCOUNTER — Encounter: Payer: Self-pay | Admitting: Oncology

## 2012-04-18 ENCOUNTER — Ambulatory Visit: Payer: BC Managed Care – PPO

## 2012-04-18 ENCOUNTER — Other Ambulatory Visit (HOSPITAL_COMMUNITY): Payer: BC Managed Care – PPO

## 2012-04-18 ENCOUNTER — Telehealth: Payer: Self-pay | Admitting: Oncology

## 2012-04-18 ENCOUNTER — Other Ambulatory Visit: Payer: Self-pay | Admitting: *Deleted

## 2012-04-18 VITALS — BP 139/78 | HR 64 | Temp 97.2°F | Resp 18 | Ht 71.0 in | Wt 211.1 lb

## 2012-04-18 DIAGNOSIS — C211 Malignant neoplasm of anal canal: Secondary | ICD-10-CM

## 2012-04-18 DIAGNOSIS — Z951 Presence of aortocoronary bypass graft: Secondary | ICD-10-CM

## 2012-04-18 DIAGNOSIS — B977 Papillomavirus as the cause of diseases classified elsewhere: Secondary | ICD-10-CM

## 2012-04-18 DIAGNOSIS — C778 Secondary and unspecified malignant neoplasm of lymph nodes of multiple regions: Secondary | ICD-10-CM

## 2012-04-18 NOTE — Telephone Encounter (Signed)
appts made and printed for pt aom °

## 2012-04-18 NOTE — Progress Notes (Signed)
[facility] New Patient Consult   Referring MD: Kristina Bertone Knipple 66 y.o.  Feb 16, 1946    Reason for Referral: Anal cancer     HPI: Kirk [name] reports developing rectal bleeding beginning in December of 2012. Kirk [name] underwent an aortic valve replacement procedure in December of 2012. The bleeding persisted when Kirk [name] recovered from surgery and Kirk [name] was referred to Dr. Dwain Sarna. On physical exam 03/11/2012 a firm mass was noted at the left lateral rectum. The mass was bleeding and tender.  Kirk [name] was referred to Dr. Arlyce Dice and underwent a colonoscopy on 04/09/2012. A firm mass was palpated just inside the anal canal. A bleeding ulcerated friable mass was noted just inside the anal canal. A biopsy confirmed invasive squamous cell carcinoma. A p16 stain was positive.  Kirk [name] was referred to Dr. Mitzi Hansen and is scheduled for radiation simulation on 04/19/2012.  A staging CT evaluation on 04/11/2012 revealed no axillary or supraclavicular adenopathy. Calcified mediastinal lymph nodes were consistent with prior granulomatous disease. Scattered small pulmonary nodules were identified and both lungs measuring up to 4.4 mm. Multiple upper abdominal lymph nodes were identified including a 1.3 cm periaortic node and an adjacent 1.2 cm node. No pelvic or inguinal adenopathy. Wall thickening was noted to involve the rectum.    Kirk [name] was referred for a staging PET scan on 04/17/2012. This revealed a focus of hypermetabolism at the right supraglottic larynx and true cord with a suggestion of a soft tissue fullness in this area. Mild hypermetabolism was noted in the right thyroid without a well-defined mass. A left supraclavicular/lower jugular node measured 1.2 cm with SUV of 3.8. No hypermetabolism in the chest. Hypermetabolic right peritoneal adenopathy with an index 1.6 cm node having an SUV of 8.5. An anorectal primary was hypermetabolic with SUV of 28.3. A right inguinal node measured 9 mm with SUV of  4.8. Mild hypermetabolism was noted at the junction of the right seminal vesicle and base of the prostate. No abnormal hypermetabolism in the skeleton.  Kirk [name] reports feeling well.  Past Medical History  Diagnosis Date  . Elevated cholesterol   . ED (erectile dysfunction)   . Aortic stenosis  November 2012   . Coronary artery disease  November 2012     aortic stenosis, CAD          . Heart murmur       . Arthritis     1995- cerv. fusion- [facility]              . Thrombosed hemorrhoids       . Kidney stones     hx  . 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 Diabetes mellitus     Type 2    Past Surgical History  Procedure Date  . Lithotripsy 2001 AND 2002    DR PETERSON  . C4-6 fusion 1995    DR STERN  . Aortic valve replacement 08/11/2011    Procedure: AORTIC VALVE REPLACEMENT (AVR);  Surgeon: Purcell Nails, MD;  Location: [facility] OR;  Service: Open Heart Surgery;  Laterality: N/A;  . Coronary artery bypass graft 08/16/2011    Procedure: CORONARY ARTERY BYPASS GRAFTING (CABG);  Surgeon: Purcell Nails, MD;  Location: [facility] OR;  Service: Open Heart Surgery;  Laterality: N/A;  CABG times one using left internal mammary artery  . Anal fissure repair     patient unsure of date    Family History  Problem Relation  Age of Onset  . Anesthesia problems Neg Hx   . Hypotension Neg Hx   . Malignant hyperthermia Neg Hx   . Pseudochol deficiency Neg Hx   . Cancer Mother  55's    breast  . Heart attack Father    .   Lung cancer-2 maternal uncles-smokers  .   Head and neck cancer-maternal grandfather-smoker  Current outpatient prescriptions:amitriptyline (ELAVIL) 25 MG tablet, Take 25 mg by mouth at bedtime.  , Disp: , Rfl: ;  aspirin EC 81 MG EC tablet, Take 1 tablet (81 mg total) by mouth daily., Disp: , Rfl: ;  atorvastatin (LIPITOR) 20 MG tablet, Take 80 mg by mouth daily. , Disp: , Rfl: ;  azelastine (ASTELIN) 137 MCG/SPRAY nasal spray, Place 1 spray into the nose 2  (two) times daily. Use in each nostril as directed, Disp: , Rfl:  lisinopril-hydrochlorothiazide (PRINZIDE,ZESTORETIC) 10-12.5 MG per tablet, Take 1 tablet by mouth daily., Disp: , Rfl: ;  metoprolol tartrate (LOPRESSOR) 25 MG tablet, TAKE 1 TABLET TWICE DAILY, Disp: 60 tablet, Rfl: 1;  mometasone (NASONEX) 50 MCG/ACT nasal spray, Place 2 sprays into the nose daily., Disp: , Rfl: ;  Multiple Vitamin (MULTIVITAMIN) tablet, Take 1 tablet by mouth daily., Disp: , Rfl:  saxagliptin HCl (ONGLYZA) 5 MG TABS tablet, Take 5 mg by mouth daily., Disp: , Rfl: ;  Vitamins-Lipotropics (LIPO-FLAVONOID PLUS) TABS, Take 1 tablet by mouth 3 (three) times daily after meals.  , Disp: , Rfl: ;  DISCONTD: atorvastatin (LIPITOR) 80 MG tablet, Take 80 mg by mouth daily., Disp: , Rfl:   Allergies:  Allergies  Allergen Reactions  . Cat Hair Extract Shortness Of Breath and Swelling  . Glucophage (Metformin Hcl)     Diarrhea, muscle cramps    Social History: Kirk [name] lives with his wife in  [place]. 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 Kirk [name] does not use tobacco. Kirk [name] drinks alcohol on rare occasion. Kirk [name] was in the [employer]	[place]. Kirk [name] denies risk factors for HIV and hepatitis. No transfusion history.  ROS:   Positives include: 39 pound weight loss after heart surgery, diarrhea secondary to a diabetic medication-resolved since starting Saxagliptin, poison ivy after working on his father and loss of sense  A complete ROS was otherwise negative.  Physical Exam:  Blood pressure 139/78, pulse 64, temperature 97.2 F (36.2 C), temperature source Oral, resp. rate 18, height 5\' 11"  (1.803 m), weight 211 lb 1.6 oz (95.754 kg).  HEENT: Oropharynx without visible mass, neck without mass Lungs: Clear bilaterally Cardiac: Regular rate and rhythm, 2/6 systolic murmur Abdomen: No hepatosplenomegaly, no mass, nontender GU: Testes without mass  Vascular: No leg edema Lymph nodes: No cervical, supraclavicular, or inguinal nodes.? Pea-sized high medial left  axillary node. Neurologic: Alert and oriented, the motor exam appears grossly intact Rectal: Normal tone-firm mass at the left direct him just proximal to the anal verge. The mass is partially circumferential.    Radiology: I reviewed the 04/11/2012 CT scans and the PET scan from 04/17/2012.    Assessment/Plan:   1. Squamous cell carcinoma of the anal canal, HPV positive. 2. Staging CT/PET scan with tiny lung nodules, hypermetabolic retroperitoneal, right inguinal, and left supraclavicular nodes 3. Hypermetabolic activity at the right supraglottic larynx on the PET scan 04/17/2012 with associated soft tissue fullness 4. History of aortic stenosis-status post aortic valve replacement and coronary artery bypass surgery in November 2012   Disposition:   Kirk [name] has been diagnosed with squamous carcinoma the anus. I discussed the diagnosis, prognosis,  and treatment options with Kirk [name] and his wife. Kirk [name] understands the hypermetabolic lymph nodes on the staging PET scan may indicate metastatic disease. This would lower his chance of undergoing curative therapy with standard chemotherapy/radiation.  Kirk [name] has been scheduled for radiation simulation on 04/19/2012. I discussed the case with radiation oncology and the simulation will be canceled for now.  His case will be presented at the GI tumor conference on 04/24/2012. We will discuss the indication for a biopsy of the supraclavicular node and a retroperitoneal node. We will also consider the indication for an ENT referral to examine the larynx.  Kirk [name] will return for an office visit on 04/30/2012.  Brewster Wolters 04/18/2012, 5:13 PM

## 2012-04-18 NOTE — Telephone Encounter (Signed)
Notified wife at request of Dr. Truett Perna that he has talked with radiation oncologist and decided to cancel the simulation for tomorrow. Will present his case at the GI Cancer Conference Wednesday and determine plan of action at this time. She reports they will be out of town then, but to call cell phone if needed.

## 2012-04-19 ENCOUNTER — Ambulatory Visit: Payer: BC Managed Care – PPO | Admitting: Radiation Oncology

## 2012-04-26 ENCOUNTER — Other Ambulatory Visit: Payer: Self-pay | Admitting: Oncology

## 2012-04-26 DIAGNOSIS — C218 Malignant neoplasm of overlapping sites of rectum, anus and anal canal: Secondary | ICD-10-CM

## 2012-04-29 ENCOUNTER — Telehealth: Payer: Self-pay | Admitting: Oncology

## 2012-04-29 ENCOUNTER — Telehealth: Payer: Self-pay | Admitting: *Deleted

## 2012-04-29 ENCOUNTER — Ambulatory Visit: Payer: BC Managed Care – PPO | Admitting: Radiation Oncology

## 2012-04-29 NOTE — Telephone Encounter (Signed)
Call from pt's wife asking if pt needs to keep 8/27 appt? Pt is being referred for biopsy. Reviewed with Dr. Truett Perna: move appt to 05/08/12 will see pt after biopsy. Returned call to pt with this information, she understands to expect call from schedulers.

## 2012-04-29 NOTE — Telephone Encounter (Signed)
lmonvm for pt re appt for 9/4 w/BS and appt for 8/27 w/Dr. Pollyann Kennedy @ 1:40 pm to arrive 1:25. The called mobile number and s/w pt wife re both appt d/t's. Wife also given correct address for Dr Pollyann Kennedy ([address] [phone]) and asked to disregard the other address given in the vm. Wife aware [facility] will call re bx appt. S/w Lyla Son re bx order and per Lyla Son order had already been put in for review by Sheralyn Boatman.

## 2012-04-30 ENCOUNTER — Ambulatory Visit: Payer: BC Managed Care – PPO | Admitting: Oncology

## 2012-05-01 ENCOUNTER — Encounter (HOSPITAL_COMMUNITY): Payer: Self-pay | Admitting: Pharmacy Technician

## 2012-05-02 ENCOUNTER — Other Ambulatory Visit: Payer: Self-pay | Admitting: Physician Assistant

## 2012-05-03 ENCOUNTER — Ambulatory Visit (HOSPITAL_COMMUNITY)
Admission: RE | Admit: 2012-05-03 | Discharge: 2012-05-03 | Disposition: A | Discharge status: Home / Self Care | Payer: BC Managed Care – PPO | Source: Ambulatory Visit | Attending: Oncology | Admitting: Oncology

## 2012-05-03 VITALS — BP 120/79 | HR 52 | Temp 96.9°F | Resp 16 | Ht 71.0 in | Wt 214.0 lb

## 2012-05-03 DIAGNOSIS — C218 Malignant neoplasm of overlapping sites of rectum, anus and anal canal: Secondary | ICD-10-CM

## 2012-05-03 DIAGNOSIS — C20 Malignant neoplasm of rectum: Secondary | ICD-10-CM

## 2012-05-03 DIAGNOSIS — R599 Enlarged lymph nodes, unspecified: Secondary | ICD-10-CM | POA: Insufficient documentation

## 2012-05-03 LAB — CBC
MCH: 30.7 pg (ref 26.0–34.0)
MCV: 93.3 fL (ref 78.0–100.0)
Platelets: 251 10*3/uL (ref 150–400)
RDW: 13.5 % (ref 11.5–15.5)

## 2012-05-03 LAB — GLUCOSE, CAPILLARY: Glucose-Capillary: 99 mg/dL (ref 70–99)

## 2012-05-03 MED ORDER — SODIUM CHLORIDE 0.9 % IV SOLN: INTRAVENOUS | Status: DC

## 2012-05-03 MED ORDER — MIDAZOLAM HCL 5 MG/5ML IJ SOLN
INTRAMUSCULAR | Status: AC | PRN
  Administered 2012-05-03: 1 mg via INTRAVENOUS

## 2012-05-03 MED ORDER — SODIUM CHLORIDE 0.9 % IV SOLN
INTRAVENOUS | Status: AC | PRN
  Administered 2012-05-03: 75 mL/h via INTRAVENOUS

## 2012-05-03 MED ORDER — FENTANYL CITRATE 0.05 MG/ML IJ SOLN
INTRAMUSCULAR | Status: AC | PRN
  Administered 2012-05-03: 50 ug via INTRAVENOUS

## 2012-05-03 MED ORDER — FENTANYL CITRATE 0.05 MG/ML IJ SOLN
INTRAMUSCULAR | Status: AC
  Filled 2012-05-03: qty 4

## 2012-05-03 MED ORDER — MIDAZOLAM HCL 2 MG/2ML IJ SOLN
INTRAMUSCULAR | Status: AC
  Filled 2012-05-03: qty 4

## 2012-05-03 NOTE — ED Notes (Signed)
IV d/c'd.  VSS  Ready for D/C.  Instructions reviewed for moderate sedation and infection.  Pt and family verbalized understanding.  D/C ambulatory.

## 2012-05-03 NOTE — Procedures (Signed)
US guided FNA of left supraclavicular lymph node.  No immediate complication.

## 2012-05-03 NOTE — H&P (Signed)
Kirk [name] is an 66 y.o. male.   Chief Complaint: rectal cancer with hypermetabolic left supraclavicular lymph node. Patient presents today for needle biopsy to evaluate for possible metastatic disease.   HPI: see oncology note below : Kirk Artist, MD Physician Signed  Progress Notes 04/18/2012 5:13 PM  Related encounter: Office Visit from 04/18/2012 in  [facility]  graphic [facility] New Patient Consult   Referring MD: Kirk [name] 66 y.o.   01-28-1946     Reason for Referral: Anal cancer  HPI: He reports developing rectal bleeding beginning in December of 2012. He underwent an aortic valve replacement procedure in December of 2012. The bleeding persisted when he recovered from surgery and he was referred to Kirk. Dwain [name]. On physical exam 03/11/2012 a firm mass was noted at the left lateral rectum. The mass was bleeding and tender.  He was referred to Kirk [name] and underwent a colonoscopy on 04/09/2012. A firm mass was palpated just inside the anal canal. A bleeding ulcerated friable mass was noted just inside the anal canal. A biopsy confirmed invasive squamous cell carcinoma. A p16 stain was positive.  He was referred to Kirk. Mitzi [name] and is scheduled for radiation simulation on 04/19/2012.  A staging CT evaluation on 04/11/2012 revealed no axillary or supraclavicular adenopathy. Calcified mediastinal lymph nodes were consistent with prior granulomatous disease. Scattered small pulmonary nodules were identified and both lungs measuring up to 4.4 mm. Multiple upper abdominal lymph nodes were identified including a 1.3 cm periaortic node and an adjacent 1.2 cm node. No pelvic or inguinal adenopathy. Wall thickening was noted to involve the rectum.  He was referred for a staging PET scan on 04/17/2012. This revealed a focus of hypermetabolism at the right supraglottic larynx and true cord with a suggestion of a soft  tissue fullness in this area. Mild hypermetabolism was noted in the right thyroid without a well-defined mass. A left supraclavicular/lower jugular node measured 1.2 cm with SUV of 3.8. No hypermetabolism in the chest. Hypermetabolic right peritoneal adenopathy with an index 1.6 cm node having an SUV of 8.5. An anorectal primary was hypermetabolic with SUV of 28.3. A right inguinal node measured 9 mm with SUV of 4.8. Mild hypermetabolism was noted at the junction of the right seminal vesicle and base of the prostate. No abnormal hypermetabolism in the skeleton.  He reports feeling well.    Past Medical History   Diagnosis  Date   .  Elevated cholesterol     .  ED (erectile dysfunction)     .  Aortic stenosis   November 2012    .  Coronary artery disease   November 2012        aortic stenosis, CAD                 .  Heart murmur            .  Arthritis         1995- cerv. fusion- [facility]                        .  Thrombosed hemorrhoids            .  Kidney stones         hx   .  Cancer  04/09/12       Rectum bx=invasive squamous cell carcinoma   .  Diabetes mellitus  Type 2       Past Surgical History   Procedure  Date   .  Lithotripsy  2001 AND 2002       Kirk [name]   .  C4-6 fusion  1995       Kirk [name]   .  Aortic valve replacement  08/11/2011       Procedure: AORTIC VALVE REPLACEMENT (AVR);  Surgeon: Kirk Nails, MD;  Location: [facility] OR;  Service: Open Heart Surgery;  Laterality: N/A;   .  Coronary artery bypass graft  08/16/2011       Procedure: CORONARY ARTERY BYPASS GRAFTING (CABG);  Surgeon: Kirk Nails, MD;  Location: [facility] OR;  Service: Open Heart Surgery;  Laterality: N/A;  CABG times one using left internal mammary artery   .  Anal fissure repair         patient unsure of date       Family History   Problem  Relation  Age of Onset   .  Anesthesia problems  Neg Hx     .  Hypotension  Neg Hx     .  Malignant hyperthermia  Neg Hx     .  Pseudochol deficiency   Neg Hx     .  Cancer  Mother   29's       breast   .  Heart attack  Father      .   Lung cancer-2 maternal uncles-smokers  .   Head and neck cancer-maternal grandfather-smoker  Current outpatient prescriptions:amitriptyline (ELAVIL) 25 MG tablet, Take 25 mg by mouth at bedtime.  , Disp: , Rfl: ;  aspirin EC 81 MG EC tablet, Take 1 tablet (81 mg total) by mouth daily., Disp: , Rfl: ;  atorvastatin (LIPITOR) 20 MG tablet, Take 80 mg by mouth daily. , Disp: , Rfl: ;  azelastine (ASTELIN) 137 MCG/SPRAY nasal spray, Place 1 spray into the nose 2 (two) times daily. Use in each nostril as directed, Disp: , Rfl:   lisinopril-hydrochlorothiazide (PRINZIDE,ZESTORETIC) 10-12.5 MG per tablet, Take 1 tablet by mouth daily., Disp: , Rfl: ;  metoprolol tartrate (LOPRESSOR) 25 MG tablet, TAKE 1 TABLET TWICE DAILY, Disp: 60 tablet, Rfl: 1;  mometasone (NASONEX) 50 MCG/ACT nasal spray, Place 2 sprays into the nose daily., Disp: , Rfl: ;  Multiple Vitamin (MULTIVITAMIN) tablet, Take 1 tablet by mouth daily., Disp: , Rfl:   saxagliptin HCl (ONGLYZA) 5 MG TABS tablet, Take 5 mg by mouth daily., Disp: , Rfl: ;  Vitamins-Lipotropics (LIPO-FLAVONOID PLUS) TABS, Take 1 tablet by mouth 3 (three) times daily after meals.  , Disp: , Rfl: ;  DISCONTD: atorvastatin (LIPITOR) 80 MG tablet, Take 80 mg by mouth daily., Disp: , Rfl:   Allergies:  Allergies   Allergen  Reactions   .  Cat Hair Extract  Shortness Of Breath and Swelling   .  Glucophage (Metformin Hcl)         Diarrhea, muscle cramps     Social History: He lives with his wife in [name]. He is a Education administrator. He does not use tobacco. He drinks alcohol on rare occasion. He was in the [employer]	[place]. He denies risk factors for HIV and hepatitis. No transfusion history.  ROS:   Positives include: 39 pound weight loss after heart surgery, diarrhea secondary to a diabetic medication-resolved since starting Saxagliptin, poison ivy after working on his father and loss of  sensation  A complete ROS was otherwise negative.  Physical  Exam:  Blood pressure 139/78, pulse 64, temperature 97.2 F (36.2 C), temperature source Oral, resp. rate 18, height 5\' 11"  (1.803 m), weight 211 lb 1.6 oz (95.754 kg).  HEENT: Oropharynx without visible mass, neck without mass Lungs: Clear bilaterally Cardiac: Regular rate and rhythm, 2/6 systolic murmur Abdomen: No hepatosplenomegaly, no mass, nontender GU: Testes without mass   Vascular: No leg edema Lymph nodes: No cervical, supraclavicular, or inguinal nodes.? Pea-sized high medial left axillary node. Neurologic: Alert and oriented, the motor exam appears grossly intact Rectal: Normal tone-firm mass at the left direct him just proximal to the anal verge. The mass is partially circumferential.  Radiology: I reviewed the 04/11/2012 CT scans and the PET scan from 04/17/2012.  Assessment/Plan:   1. Squamous cell carcinoma of the anal canal, HPV positive. 2. Staging CT/PET scan with tiny lung nodules, hypermetabolic retroperitoneal, right inguinal, and left supraclavicular nodes 3. Hypermetabolic activity at the right supraglottic larynx on the PET scan 04/17/2012 with associated soft tissue fullness 4. History of aortic stenosis-status post aortic valve replacement and coronary artery bypass surgery in November 2012   Disposition:    He has been diagnosed with squamous carcinoma the anus. I discussed the diagnosis, prognosis, and treatment options with Kirk [name] and his wife. He understands the hypermetabolic lymph nodes on the staging PET scan may indicate metastatic disease. This would lower his chance of undergoing curative therapy with standard chemotherapy/radiation.  He has been scheduled for radiation simulation on 04/19/2012. I discussed the case with radiation oncology and the simulation will be canceled for now.  His case will be presented at the GI tumor conference on 04/24/2012. We will discuss the indication  for a biopsy of the supraclavicular node and a retroperitoneal node. We will also consider the indication for an ENT referral to examine the larynx.  Kirk [name] will return for an office visit on 04/30/2012.  SHERRILL, GARY 04/18/2012, 5:13 PM    Today's examination findings :    Past Medical History  Diagnosis Date  . Elevated cholesterol   . ED (erectile dysfunction)   . Aortic stenosis   . Coronary artery disease     aortic stenosis, CAD  . Angina     6 wks. ago  . Heart murmur   . Shortness of breath   . Arthritis     1995- cerv. fusion- [facility]  . CHF (congestive heart failure)   . Rectal bleeding   . Blood in stool   . Thrombosed hemorrhoids   . ED (erectile dysfunction)   . Kidney stones     hx  . 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 Diabetes mellitus     Type 2    Past Surgical History  Procedure Date  . Lithotripsy 2001 AND 2002    Kirk [name]  . C4-6 fusion 1995    Kirk [name]  . Aortic valve replacement 08/11/2011    Procedure: AORTIC VALVE REPLACEMENT (AVR);  Surgeon: Kirk Nails, MD;  Location: [facility] OR;  Service: Open Heart Surgery;  Laterality: N/A;  . Coronary artery bypass graft 08/16/2011    Procedure: CORONARY ARTERY BYPASS GRAFTING (CABG);  Surgeon: Kirk Nails, MD;  Location: [facility] OR;  Service: Open Heart Surgery;  Laterality: N/A;  CABG times one using left internal mammary artery  . Anal fissure repair     patient unsure of date   No prior complications with moderate sedation or general anesthesia.  Family History  Problem Relation Age of Onset  . Anesthesia problems Neg Hx   . Hypotension Neg Hx   . Malignant hyperthermia Neg Hx   . Pseudochol deficiency Neg Hx   . Cancer Mother     breast  . Heart attack Father    Social History:  reports that he has never smoked. He has never used smokeless tobacco. He reports that he drinks alcohol. He reports that he does not use illicit drugs.  Allergies:  Allergies   Allergen Reactions  . Cat Hair Extract Shortness Of Breath and Swelling  . Glucophage (Metformin Hcl)     Diarrhea, muscle cramps    Results for orders placed during the hospital encounter of 05/03/12 (from the past 48 hour(s))  APTT     Status: Normal   Collection Time   05/03/12 12:50 PM      Component Value Range Comment   aPTT 29  24 - 37 seconds   CBC     Status: Normal   Collection Time   05/03/12 12:50 PM      Component Value Range Comment   WBC 8.1  4.0 - 10.5 K/uL    RBC 4.36  4.22 - 5.81 MIL/uL    Hemoglobin 13.4  13.0 - 17.0 g/dL    HCT 16.1  09.6 - 04.5 %    MCV 93.3  78.0 - 100.0 fL    MCH 30.7  26.0 - 34.0 pg    MCHC 32.9  30.0 - 36.0 g/dL    RDW 40.9  81.1 - 91.4 %    Platelets 251  150 - 400 K/uL   PROTIME-INR     Status: Normal   Collection Time   05/03/12 12:50 PM      Component Value Range Comment   Prothrombin Time 12.4  11.6 - 15.2 seconds    INR 0.91  0.00 - 1.49   GLUCOSE, CAPILLARY     Status: Normal   Collection Time   05/03/12  1:30 PM      Component Value Range Comment   Glucose-Capillary 99  70 - 99 mg/dL    Review of Systems  Constitutional: Positive for weight loss. Negative for fever, chills and malaise/fatigue.  Respiratory: Negative for cough, hemoptysis, sputum production, shortness of breath and wheezing.   Cardiovascular: Negative for chest pain, palpitations, claudication and leg swelling.  Gastrointestinal: Negative for nausea, vomiting, abdominal pain and blood in stool.  Musculoskeletal: Negative.   Neurological: Negative.  Negative for weakness.  Endo/Heme/Allergies: Negative.   Psychiatric/Behavioral: Negative.     Blood pressure 113/67, pulse 56, temperature 96.9 F (36.1 C), temperature source Oral, resp. rate 18, height 5\' 11"  (1.803 m), weight 214 lb (97.07 kg), SpO2 98.00%. Physical Exam  Constitutional: He is oriented to person, place, and time. He appears well-developed and well-nourished. No distress.  HENT:  Head:  Normocephalic and atraumatic.  Neck: Normal range of motion.       Nonpalpable adenopathy   Cardiovascular: Normal rate.  Exam reveals no gallop and no friction rub.   Murmur heard. Respiratory: Breath sounds normal.  GI: Soft. Bowel sounds are normal.  Musculoskeletal: Normal range of motion. He exhibits no edema.  Lymphadenopathy:    He has no cervical adenopathy.  Neurological: He is alert and oriented to person, place, and time.  Skin: Skin is warm and dry.  Psychiatric: He has a normal mood and affect. His behavior is normal. Judgment and thought content normal.  Assessment/Plan Procedure details for supraclavicular needle and needle core biopsy discussed in detail with patient. Reasons for same and potential complications including butnot limited to infection, bleeding, inadequate sampling and complications with moderate sedation reviewed with patient and all questions answered to his satisfaction. Written consent obtained.     CAMPBELL,PAMELA D 05/03/2012, 1:38 PM

## 2012-05-03 NOTE — ED Notes (Signed)
O2 2L/[place] started 

## 2012-05-03 NOTE — ED Notes (Signed)
O2 d/c'd 

## 2012-05-08 ENCOUNTER — Telehealth: Payer: Self-pay | Admitting: *Deleted

## 2012-05-08 ENCOUNTER — Ambulatory Visit (HOSPITAL_BASED_OUTPATIENT_CLINIC_OR_DEPARTMENT_OTHER): Payer: BC Managed Care – PPO | Admitting: Oncology

## 2012-05-08 ENCOUNTER — Telehealth: Payer: Self-pay | Admitting: Oncology

## 2012-05-08 VITALS — BP 125/68 | HR 78 | Temp 98.9°F | Resp 20 | Ht 71.0 in | Wt 215.0 lb

## 2012-05-08 DIAGNOSIS — C210 Malignant neoplasm of anus, unspecified: Secondary | ICD-10-CM

## 2012-05-08 DIAGNOSIS — B977 Papillomavirus as the cause of diseases classified elsewhere: Secondary | ICD-10-CM

## 2012-05-08 DIAGNOSIS — C778 Secondary and unspecified malignant neoplasm of lymph nodes of multiple regions: Secondary | ICD-10-CM

## 2012-05-08 DIAGNOSIS — C211 Malignant neoplasm of anal canal: Secondary | ICD-10-CM

## 2012-05-08 NOTE — Telephone Encounter (Signed)
The patient's wife called regarding a call that ahe received. I have given Jacki Cones the scheduler the message. JMW

## 2012-05-08 NOTE — Progress Notes (Signed)
[facility]    OFFICE PROGRESS NOTE   INTERVAL HISTORY:   He returns as scheduled. No new complaint. He saw Dr. Pollyann Kennedy for an ENT exam and no abnormality was noted. An ultrasound-guided biopsy of the left supraclavicular lymph node 05/03/2012 confirmed metastatic squamous cell carcinoma.   Objective:  Vital signs in last 24 hours:  Blood pressure 125/68, pulse 78, temperature 98.9 F (37.2 C), temperature source Oral, resp. rate 20, height 5\' 11"  (1.803 m), weight 215 lb (97.523 kg).    HEENT: Neck without mass. Small ecchymosis at the left supraclavicular biopsy site. Lymphatics: No cervical, supraclavicular, axillary, or inguinal nodes Resp: Lungs clear bilaterally Cardio: Regular rate and rhythm GI: No hepatosplenomegaly, no mass, nontender Vascular: No leg edema   Lab Results:  Lab Results  Component Value Date   WBC 8.1 05/03/2012   HGB 13.4 05/03/2012   HCT 40.7 05/03/2012   MCV 93.3 05/03/2012   PLT 251 05/03/2012   X-rays: PET scan 04/17/2012-focus of right-sided hypermetabolism at the supraglottic larynx and true cord. Mild hypermetabolism involving the right thyroid without a well-defined mass. A left supraclavicular node measures 1.2 cm with an SUV of 3.8. No abnormal hypermetabolic activity in the chest. Hypermetabolic retro-peritoneal adenopathy, 1.6 centimeter node with an SUV of 8.5, the anal rectal primary has an SUV of 28.3, a 9 mm right inguinal node has an SUV of 4.8, mild hypermetabolism at the junction of the right seminal vesicle and base the prostate with SUV of 3.6. No abnormal hypermetabolic activity in the skeleton.  Medications: I have reviewed the patient's current medications.  Assessment/Plan: 1.Squamous cell carcinoma of the anal canal, HPV positive.  2. Staging CT/PET scan with tiny lung nodules, hypermetabolic retroperitoneal, right inguinal, and left supraclavicular nodes . FNA biopsy of a left supraclavicular lymph node on  05/03/2012 confirmed metastatic squamous cell carcinoma. 3. Hypermetabolic activity at the right supraglottic larynx on the PET scan 04/17/2012 with associated soft tissue fullness , status post a negative ENT evaluation by Dr. Pollyann Kennedy 4. History of aortic stenosis-status post aortic valve replacement and coronary artery bypass surgery in November 2012   Disposition:  He has been diagnosed with metastatic squamous cell carcinoma of the anus. He appears to have oligometastatic disease involving retroperitoneal lymph nodes and a left supraclavicular node. I discussed treatment options with Mr. Penkala and his wife. He understands the likelihood of undergoing curative therapy is small given the positive left supraclavicular lymph node. However there is a significant chance of obtaining a clinical remission with treatment.  I discussed the case with Dr. Mitzi Hansen. The plan is to proceed with "standard" chemotherapy and radiation with the hope of palliating the primary anal tumor and decreasing the systemic tumor burden.  We discussed the potential toxicities associated with the 5-fluorouracil/mitomycin-C chemotherapy regimen and concurrent radiation. He understands the potential for nausea/vomiting, mucositis, diarrhea, alopecia, hematologic toxicity, skin hyperpigmentation, and skin breakdown. We also discussed the hemolytic uremic syndrome associated with mitomycin-C.  He agrees to proceed with combined therapy. He will be scheduled to begin concurrent chemotherapy and radiation on 05/20/2012. A PICC will be placed for the administration of chemotherapy. Mr. Hillhouse will attend a chemotherapy teaching class.  The plan is to obtain a restaging CT/PET evaluation after the completion of combined modality therapy.  Approximately 45 minutes were spent with the patient today. At least 50% of the time was spent in counseling and coordination of care.   Thornton Papas, MD  05/08/2012  2:37  PM

## 2012-05-08 NOTE — Telephone Encounter (Signed)
Pt's wife called regarding portacath, informed her that Jacki Cones will call her back, called laurie and left message

## 2012-05-08 NOTE — Patient Instructions (Signed)
Kirk [name] 1945/11/13 [id number]  [facility] Discharge Instructions  Your exam findings, labs and results were discussed with your MD today.  Filed Vitals:   05/08/12 1115  BP: 125/68  Pulse: 78  Temp: 98.9 F (37.2 C)  Resp: 20   Current outpatient prescriptions:amitriptyline (ELAVIL) 25 MG tablet, Take 25 mg by mouth at bedtime.  , Disp: , Rfl: ;  aspirin EC 81 MG tablet, Take 81 mg by mouth daily., Disp: , Rfl: ;  atorvastatin (LIPITOR) 20 MG tablet, Take 80 mg by mouth daily. , Disp: , Rfl: ;  azelastine (ASTELIN) 137 MCG/SPRAY nasal spray, Place 1 spray into the nose 2 (two) times daily. , Disp: , Rfl:  lisinopril-hydrochlorothiazide (PRINZIDE,ZESTORETIC) 10-12.5 MG per tablet, Take 1 tablet by mouth daily., Disp: , Rfl: ;  metoprolol tartrate (LOPRESSOR) 25 MG tablet, Take 25 mg by mouth 2 (two) times daily., Disp: , Rfl: ;  mometasone (NASONEX) 50 MCG/ACT nasal spray, Place 2 sprays into the nose daily., Disp: , Rfl: ;  Multiple Vitamin (MULTIVITAMIN) tablet, Take 1 tablet by mouth daily., Disp: , Rfl:  saxagliptin HCl (ONGLYZA) 5 MG TABS tablet, Take 5 mg by mouth daily., Disp: , Rfl: ;  Vitamins-Lipotropics (LIPO-FLAVONOID PLUS) TABS, Take 1 tablet by mouth 3 (three) times daily after meals.  , Disp: , Rfl:   Please visit scheduling to obtain calendar for future appointments.  Please call the [facility] at [phone] during business hours should you have any further questions or need assistance in obtaining follow-up care. If you have a medical emergency, please dial 911.  Special Instructions:

## 2012-05-09 ENCOUNTER — Telehealth: Payer: Self-pay | Admitting: *Deleted

## 2012-05-09 ENCOUNTER — Other Ambulatory Visit (HOSPITAL_COMMUNITY): Payer: BC Managed Care – PPO

## 2012-05-09 ENCOUNTER — Other Ambulatory Visit: Payer: Self-pay | Admitting: *Deleted

## 2012-05-09 ENCOUNTER — Ambulatory Visit (HOSPITAL_COMMUNITY): Payer: BC Managed Care – PPO

## 2012-05-09 NOTE — Telephone Encounter (Signed)
5-fu pump /mitomycin C 9/16, PICC 9/16, office sherrill ofr lisa 9/27, chemo. class prior to 9/16, lab day of chemo. class, pump/PICC d/c 9/20  Sent michelle email to set up patient treatment  Spoke with Inetta Fermo at IR she set up pic line with the patient's wife

## 2012-05-10 ENCOUNTER — Ambulatory Visit
Admission: RE | Admit: 2012-05-10 | Discharge: 2012-05-10 | Disposition: A | Discharge status: Home / Self Care | Payer: BC Managed Care – PPO | Source: Ambulatory Visit | Attending: Radiation Oncology | Admitting: Radiation Oncology

## 2012-05-10 DIAGNOSIS — C211 Malignant neoplasm of anal canal: Secondary | ICD-10-CM

## 2012-05-12 DIAGNOSIS — C211 Malignant neoplasm of anal canal: Secondary | ICD-10-CM

## 2012-05-12 HISTORY — DX: Malignant neoplasm of anal canal: C21.1

## 2012-05-13 ENCOUNTER — Other Ambulatory Visit: Payer: BC Managed Care – PPO

## 2012-05-14 ENCOUNTER — Other Ambulatory Visit: Payer: BC Managed Care – PPO

## 2012-05-14 ENCOUNTER — Encounter: Payer: Self-pay | Admitting: *Deleted

## 2012-05-15 ENCOUNTER — Telehealth: Payer: Self-pay | Admitting: Gastroenterology

## 2012-05-15 NOTE — Telephone Encounter (Signed)
Forward 5 pages from [facility] to Dr. Melvia Heaps for review on 05-15-12 ym

## 2012-05-17 ENCOUNTER — Other Ambulatory Visit: Payer: Self-pay | Admitting: Oncology

## 2012-05-20 ENCOUNTER — Other Ambulatory Visit: Payer: Self-pay | Admitting: Oncology

## 2012-05-20 ENCOUNTER — Other Ambulatory Visit (HOSPITAL_BASED_OUTPATIENT_CLINIC_OR_DEPARTMENT_OTHER): Payer: BC Managed Care – PPO

## 2012-05-20 ENCOUNTER — Ambulatory Visit (HOSPITAL_COMMUNITY)
Admission: RE | Admit: 2012-05-20 | Discharge: 2012-05-20 | Disposition: A | Discharge status: Home / Self Care | Payer: BC Managed Care – PPO | Source: Ambulatory Visit | Attending: Oncology | Admitting: Oncology

## 2012-05-20 ENCOUNTER — Other Ambulatory Visit: Payer: Self-pay | Admitting: Nurse Practitioner

## 2012-05-20 ENCOUNTER — Ambulatory Visit
Admission: RE | Admit: 2012-05-20 | Discharge: 2012-05-20 | Disposition: A | Discharge status: Home / Self Care | Payer: BC Managed Care – PPO | Source: Ambulatory Visit | Attending: Radiation Oncology | Admitting: Radiation Oncology

## 2012-05-20 ENCOUNTER — Ambulatory Visit (HOSPITAL_BASED_OUTPATIENT_CLINIC_OR_DEPARTMENT_OTHER): Payer: BC Managed Care – PPO

## 2012-05-20 DIAGNOSIS — B977 Papillomavirus as the cause of diseases classified elsewhere: Secondary | ICD-10-CM

## 2012-05-20 DIAGNOSIS — C211 Malignant neoplasm of anal canal: Secondary | ICD-10-CM

## 2012-05-20 DIAGNOSIS — C210 Malignant neoplasm of anus, unspecified: Secondary | ICD-10-CM

## 2012-05-20 DIAGNOSIS — C801 Malignant (primary) neoplasm, unspecified: Secondary | ICD-10-CM

## 2012-05-20 DIAGNOSIS — Z5111 Encounter for antineoplastic chemotherapy: Secondary | ICD-10-CM

## 2012-05-20 DIAGNOSIS — C770 Secondary and unspecified malignant neoplasm of lymph nodes of head, face and neck: Secondary | ICD-10-CM

## 2012-05-20 LAB — CBC WITH DIFFERENTIAL/PLATELET
BASO%: 0.5 % (ref 0.0–2.0)
Eosinophils Absolute: 0.4 10*3/uL (ref 0.0–0.5)
LYMPH%: 13.3 % — ABNORMAL LOW (ref 14.0–49.0)
MCHC: 32.9 g/dL (ref 32.0–36.0)
MONO#: 0.6 10*3/uL (ref 0.1–0.9)
NEUT#: 5.9 10*3/uL (ref 1.5–6.5)
Platelets: 227 10*3/uL (ref 140–400)
RBC: 4.51 10*6/uL (ref 4.20–5.82)
RDW: 13.6 % (ref 11.0–14.6)
WBC: 8 10*3/uL (ref 4.0–10.3)
lymph#: 1.1 10*3/uL (ref 0.9–3.3)
nRBC: 0 % (ref 0–0)

## 2012-05-20 LAB — COMPREHENSIVE METABOLIC PANEL (CC13)
ALT: 19 U/L (ref 0–55)
AST: 22 U/L (ref 5–34)
Albumin: 4 g/dL (ref 3.5–5.0)
CO2: 28 mEq/L (ref 22–29)
Calcium: 9.9 mg/dL (ref 8.4–10.4)
Chloride: 100 mEq/L (ref 98–107)
Potassium: 4.6 mEq/L (ref 3.5–5.1)
Sodium: 136 mEq/L (ref 136–145)
Total Protein: 7.3 g/dL (ref 6.4–8.3)

## 2012-05-20 MED ORDER — DEXAMETHASONE SODIUM PHOSPHATE 10 MG/ML IJ SOLN
10.0000 mg | Freq: Once | INTRAMUSCULAR | Status: AC
  Administered 2012-05-20: 10 mg via INTRAVENOUS

## 2012-05-20 MED ORDER — LIDOCAINE HCL 1 % IJ SOLN
INTRAMUSCULAR | Status: AC
  Filled 2012-05-20: qty 20

## 2012-05-20 MED ORDER — PROCHLORPERAZINE MALEATE 10 MG PO TABS: 10.0000 mg | ORAL_TABLET | Freq: Four times a day (QID) | ORAL | Status: DC | PRN

## 2012-05-20 MED ORDER — SODIUM CHLORIDE 0.9 % IV SOLN
1000.0000 mg/m2/d | INTRAVENOUS | Status: DC
  Administered 2012-05-20: 8850 mg via INTRAVENOUS
  Filled 2012-05-20: qty 177

## 2012-05-20 MED ORDER — SODIUM CHLORIDE 0.9 % IV SOLN
Freq: Once | INTRAVENOUS | Status: AC
  Administered 2012-05-20: 14:00:00 via INTRAVENOUS

## 2012-05-20 MED ORDER — ONDANSETRON 8 MG/50ML IVPB (CHCC)
8.0000 mg | Freq: Once | INTRAVENOUS | Status: AC
  Administered 2012-05-20: 8 mg via INTRAVENOUS

## 2012-05-20 MED ORDER — MITOMYCIN CHEMO IV INJECTION 40 MG
10.0000 mg/m2 | Freq: Once | INTRAVENOUS | Status: AC
  Administered 2012-05-20: 22 mg via INTRAVENOUS
  Filled 2012-05-20: qty 44

## 2012-05-20 NOTE — Patient Instructions (Addendum)
[facility] Discharge Instructions for Patients Receiving Chemotherapy  Today you received the following chemotherapy agents mitomycin, 5FU pump.  To help prevent nausea and vomiting after your treatment, we encourage you to take your nausea medication if needed Begin taking it at 8pm and take it as often as prescribed.   If you develop nausea and vomiting that is not controlled by your nausea medication, call the clinic. If it is after clinic hours your family physician or the after hours number for the clinic or go to the Emergency Department.   BELOW ARE SYMPTOMS THAT SHOULD BE REPORTED IMMEDIATELY:  *FEVER GREATER THAN 100.5 F  *CHILLS WITH OR WITHOUT FEVER  NAUSEA AND VOMITING THAT IS NOT CONTROLLED WITH YOUR NAUSEA MEDICATION  *UNUSUAL SHORTNESS OF BREATH  *UNUSUAL BRUISING OR BLEEDING  TENDERNESS IN MOUTH AND THROAT WITH OR WITHOUT PRESENCE OF ULCERS  *URINARY PROBLEMS  *BOWEL PROBLEMS  UNUSUAL RASH Items with * indicate a potential emergency and should be followed up as soon as possible.  One of the nurses will contact you 24 hours after your treatment. Please let the nurse know about any problems that you may have experienced. Feel free to call the clinic you have any questions or concerns. The clinic phone number is [phone].   I have been informed and understand all the instructions given to me. I know to contact the clinic, my physician, or go to the Emergency Department if any problems should occur. I do not have any questions at this time, but understand that I may call the clinic during office hours   should I have any questions or need assistance in obtaining follow up care.    __________________________________________  _____________  __________ Signature of Patient or Authorized Representative            Date                   Time    __________________________________________ Nurse's Signature

## 2012-05-20 NOTE — Procedures (Signed)
Successful placement of right basilic vein approach 41 cm dual lumen PICC line with tip at the superior caval-atrial junction.  The PICC line is ready for immediate use. 

## 2012-05-21 ENCOUNTER — Ambulatory Visit
Admission: RE | Admit: 2012-05-21 | Discharge: 2012-05-21 | Disposition: A | Discharge status: Home / Self Care | Payer: BC Managed Care – PPO | Source: Ambulatory Visit | Attending: Radiation Oncology | Admitting: Radiation Oncology

## 2012-05-21 ENCOUNTER — Ambulatory Visit: Payer: BC Managed Care – PPO

## 2012-05-22 ENCOUNTER — Ambulatory Visit
Admission: RE | Admit: 2012-05-22 | Discharge: 2012-05-22 | Disposition: A | Discharge status: Home / Self Care | Payer: BC Managed Care – PPO | Source: Ambulatory Visit | Attending: Radiation Oncology | Admitting: Radiation Oncology

## 2012-05-22 ENCOUNTER — Ambulatory Visit (HOSPITAL_BASED_OUTPATIENT_CLINIC_OR_DEPARTMENT_OTHER): Payer: BC Managed Care – PPO

## 2012-05-22 ENCOUNTER — Ambulatory Visit: Payer: BC Managed Care – PPO

## 2012-05-22 VITALS — BP 111/87 | HR 61 | Temp 97.9°F | Resp 18

## 2012-05-22 DIAGNOSIS — C211 Malignant neoplasm of anal canal: Secondary | ICD-10-CM

## 2012-05-22 DIAGNOSIS — Z452 Encounter for adjustment and management of vascular access device: Secondary | ICD-10-CM

## 2012-05-22 DIAGNOSIS — C210 Malignant neoplasm of anus, unspecified: Secondary | ICD-10-CM

## 2012-05-22 DIAGNOSIS — C778 Secondary and unspecified malignant neoplasm of lymph nodes of multiple regions: Secondary | ICD-10-CM

## 2012-05-22 MED ORDER — SODIUM CHLORIDE 0.9 % IJ SOLN
10.0000 mL | INTRAMUSCULAR | Status: DC | PRN
  Administered 2012-05-22: 10 mL via INTRAVENOUS
  Filled 2012-05-22: qty 10

## 2012-05-22 MED ORDER — HEPARIN SOD (PORK) LOCK FLUSH 100 UNIT/ML IV SOLN
500.0000 [IU] | Freq: Once | INTRAVENOUS | Status: AC
  Administered 2012-05-22: 500 [IU] via INTRAVENOUS
  Filled 2012-05-22: qty 5

## 2012-05-23 ENCOUNTER — Ambulatory Visit: Payer: BC Managed Care – PPO

## 2012-05-23 ENCOUNTER — Ambulatory Visit
Admission: RE | Admit: 2012-05-23 | Discharge: 2012-05-23 | Disposition: A | Discharge status: Home / Self Care | Payer: BC Managed Care – PPO | Source: Ambulatory Visit | Attending: Radiation Oncology | Admitting: Radiation Oncology

## 2012-05-24 ENCOUNTER — Ambulatory Visit (HOSPITAL_BASED_OUTPATIENT_CLINIC_OR_DEPARTMENT_OTHER): Payer: BC Managed Care – PPO

## 2012-05-24 ENCOUNTER — Ambulatory Visit
Admission: RE | Admit: 2012-05-24 | Discharge: 2012-05-24 | Disposition: A | Discharge status: Home / Self Care | Payer: BC Managed Care – PPO | Source: Ambulatory Visit | Attending: Radiation Oncology | Admitting: Radiation Oncology

## 2012-05-24 ENCOUNTER — Ambulatory Visit: Payer: BC Managed Care – PPO

## 2012-05-24 ENCOUNTER — Encounter: Payer: Self-pay | Admitting: Radiation Oncology

## 2012-05-24 VITALS — BP 126/67 | HR 74 | Temp 97.9°F | Resp 18

## 2012-05-24 VITALS — BP 128/60 | HR 75 | Temp 98.6°F | Resp 20 | Wt 217.8 lb

## 2012-05-24 DIAGNOSIS — Z952 Presence of prosthetic heart valve: Secondary | ICD-10-CM

## 2012-05-24 DIAGNOSIS — I350 Nonrheumatic aortic (valve) stenosis: Secondary | ICD-10-CM

## 2012-05-24 DIAGNOSIS — Z951 Presence of aortocoronary bypass graft: Secondary | ICD-10-CM

## 2012-05-24 DIAGNOSIS — C801 Malignant (primary) neoplasm, unspecified: Secondary | ICD-10-CM

## 2012-05-24 DIAGNOSIS — N200 Calculus of kidney: Secondary | ICD-10-CM

## 2012-05-24 DIAGNOSIS — I4891 Unspecified atrial fibrillation: Secondary | ICD-10-CM

## 2012-05-24 DIAGNOSIS — N529 Male erectile dysfunction, unspecified: Secondary | ICD-10-CM

## 2012-05-24 DIAGNOSIS — E7800 Pure hypercholesterolemia, unspecified: Secondary | ICD-10-CM

## 2012-05-24 DIAGNOSIS — C211 Malignant neoplasm of anal canal: Secondary | ICD-10-CM

## 2012-05-24 DIAGNOSIS — C778 Secondary and unspecified malignant neoplasm of lymph nodes of multiple regions: Secondary | ICD-10-CM

## 2012-05-24 DIAGNOSIS — K6289 Other specified diseases of anus and rectum: Secondary | ICD-10-CM

## 2012-05-24 DIAGNOSIS — I2510 Atherosclerotic heart disease of native coronary artery without angina pectoris: Secondary | ICD-10-CM

## 2012-05-24 DIAGNOSIS — C218 Malignant neoplasm of overlapping sites of rectum, anus and anal canal: Secondary | ICD-10-CM

## 2012-05-24 DIAGNOSIS — Z86010 Personal history of colonic polyps: Secondary | ICD-10-CM

## 2012-05-24 MED ORDER — SODIUM CHLORIDE 0.9 % IJ SOLN
10.0000 mL | INTRAMUSCULAR | Status: DC | PRN
  Administered 2012-05-24: 10 mL
  Filled 2012-05-24: qty 10

## 2012-05-24 MED ORDER — HEPARIN SOD (PORK) LOCK FLUSH 100 UNIT/ML IV SOLN
500.0000 [IU] | Freq: Once | INTRAVENOUS | Status: AC | PRN
  Administered 2012-05-24: 500 [IU]
  Filled 2012-05-24: qty 5

## 2012-05-24 NOTE — Progress Notes (Signed)
Patient here weekly  Rad txs, 5/30 txs pelvis  Completed, no c/o pain,nausea,diarrhea, post sim teaching, radiation therapy and you book given, teach back by patient, he declined  Sitz bath offerred, he takes bath, stated 3:47 PM

## 2012-05-24 NOTE — Progress Notes (Signed)
Department of Radiation Oncology  Phone:  [phone] Fax:        [phone]  Weekly Treatment Note    Name: Kirk [name] Date: 05/24/2012 MRN: [id number] DOB: December 24, 1945   Current dose: 9 Gy  Current fraction: 5   MEDICATIONS: Current Outpatient Prescriptions  Medication Sig Dispense Refill  . amitriptyline (ELAVIL) 25 MG tablet Take 25 mg by mouth at bedtime.        Marland Kitchen aspirin EC 81 MG tablet Take 81 mg by mouth daily.      Marland Kitchen atorvastatin (LIPITOR) 20 MG tablet Take 80 mg by mouth daily.       Marland Kitchen azelastine (ASTELIN) 137 MCG/SPRAY nasal spray Place 1 spray into the nose 2 (two) times daily.       Marland Kitchen lisinopril-hydrochlorothiazide (PRINZIDE,ZESTORETIC) 10-12.5 MG per tablet Take 1 tablet by mouth daily.      Marland Kitchen loperamide (IMODIUM) 2 MG capsule Take 2 mg by mouth 4 (four) times daily as needed.      . metoprolol tartrate (LOPRESSOR) 25 MG tablet Take 25 mg by mouth 2 (two) times daily.      . mometasone (NASONEX) 50 MCG/ACT nasal spray Place 2 sprays into the nose daily.      . Multiple Vitamin (MULTIVITAMIN) tablet Take 1 tablet by mouth daily.      . prochlorperazine (COMPAZINE) 10 MG tablet Take 1 tablet (10 mg total) by mouth every 6 (six) hours as needed.  30 tablet  2  . saxagliptin HCl (ONGLYZA) 5 MG TABS tablet Take 5 mg by mouth daily.      . Vitamins-Lipotropics (LIPO-FLAVONOID PLUS) TABS Take 1 tablet by mouth 3 (three) times daily after meals.         No current facility-administered medications for this encounter.   Facility-Administered Medications Ordered in Other Encounters  Medication Dose Route Frequency Provider Last Rate Last Dose  . fluorouracil (ADRUCIL) 8,850 mg in sodium chloride 0.9 % 150 mL chemo infusion  1,000 mg/m2/day (Treatment Plan Actual) Intravenous 4 days [name], MD   8,850 mg at 05/20/12 1527  . heparin lock flush 100 unit/mL  500 Units Intracatheter Once PRN [name], MD      . sodium chloride 0.9 % injection 10 mL  10  mL Intracatheter PRN [name], MD         ALLERGIES: Cat hair extract and Glucophage   LABORATORY DATA:  Lab Results  Component Value Date   WBC 8.0 05/20/2012   HGB 14.0 05/20/2012   HCT 42.5 05/20/2012   MCV 94.2 05/20/2012   PLT 227 05/20/2012   Lab Results  Component Value Date   NA 136 05/20/2012   K 4.6 05/20/2012   CL 100 05/20/2012   CO2 28 05/20/2012   Lab Results  Component Value Date   ALT 19 05/20/2012   AST 22 05/20/2012   ALKPHOS 81 05/20/2012   BILITOT 0.60 05/20/2012     NARRATIVE: Kirk [name] was seen today for weekly treatment management. The chart was checked and the patient's films were reviewed. The patient is doing well. He states that he is not having any problems at this point. Teaching has been performed. Several questions were answered. The patient it appears is still dealing with his diagnosis but seems up for proceeding with the treatment plan that has been outlined for him.  PHYSICAL EXAMINATION: weight is 217 lb 12.8 oz (98.793 kg). His oral temperature is 98.6 F (37 C).  His blood pressure is 128/60 and his pulse is 75. His respiration is 20.        ASSESSMENT: The patient is doing satisfactorily with treatment.  PLAN: We will continue with the patient's radiation treatment as planned.

## 2012-05-27 ENCOUNTER — Ambulatory Visit (HOSPITAL_BASED_OUTPATIENT_CLINIC_OR_DEPARTMENT_OTHER): Payer: BC Managed Care – PPO

## 2012-05-27 ENCOUNTER — Telehealth: Payer: Self-pay | Admitting: *Deleted

## 2012-05-27 ENCOUNTER — Ambulatory Visit: Payer: BC Managed Care – PPO

## 2012-05-27 ENCOUNTER — Ambulatory Visit
Admission: RE | Admit: 2012-05-27 | Discharge: 2012-05-27 | Disposition: A | Discharge status: Home / Self Care | Payer: BC Managed Care – PPO | Source: Ambulatory Visit | Attending: Radiation Oncology | Admitting: Radiation Oncology

## 2012-05-27 VITALS — BP 105/65 | HR 74 | Temp 98.2°F | Resp 20

## 2012-05-27 DIAGNOSIS — C210 Malignant neoplasm of anus, unspecified: Secondary | ICD-10-CM

## 2012-05-27 MED ORDER — HEPARIN SOD (PORK) LOCK FLUSH 100 UNIT/ML IV SOLN
500.0000 [IU] | Freq: Once | INTRAVENOUS | Status: DC
  Filled 2012-05-27: qty 5

## 2012-05-27 MED ORDER — HEPARIN SOD (PORK) LOCK FLUSH 100 UNIT/ML IV SOLN
500.0000 [IU] | Freq: Once | INTRAVENOUS | Status: AC
  Administered 2012-05-27: 500 [IU] via INTRAVENOUS
  Filled 2012-05-27: qty 5

## 2012-05-27 MED ORDER — SODIUM CHLORIDE 0.9 % IJ SOLN
10.0000 mL | INTRAMUSCULAR | Status: DC | PRN
  Administered 2012-05-27: 10 mL via INTRAVENOUS
  Filled 2012-05-27: qty 10

## 2012-05-27 MED ORDER — SODIUM CHLORIDE 0.9 % IJ SOLN
10.0000 mL | INTRAMUSCULAR | Status: DC | PRN
  Filled 2012-05-27: qty 10

## 2012-05-27 NOTE — Telephone Encounter (Signed)
Patient wife had called stating patient having diarrhea and imodium not helping requested something else to be  called in , he has rad tx today around 320pm, , called back after got the message, spoke with Kirk [name], stated" he has had diarrhea ever since he started chemotherapy ,he took 1 imodium this am, had some structure with watery diarhhea, he is only taking 4 daily per box directions", asked if he has had anymore today, patient " stated no, not yet," informed him he can take up t 8 in a 24 hour period, , he can be seen before or after tx, Dr.moody will be here this afternoon, patient said if he had any further episodes and imodium still not helping he will see Md, thanked [place] for calling back so soon and clarifying the imodium  11:30 AM

## 2012-05-27 NOTE — Telephone Encounter (Signed)
Message left for f/u requesting a return call.  Patient will be here later today for P.I.C.C. flush.

## 2012-05-27 NOTE — Telephone Encounter (Signed)
Message copied by Augusto Garbe on Mon May 27, 2012 12:59 PM ------      Message from: Gaylord Shih      Created: Mon May 20, 2012  4:36 PM      Regarding: chemo follow up      Contact: [phone]       Mitomycin push, 4 day 6fu pump. Wife Dondra Spry is the cell number given. Dr Truett Perna. Also new PICC on 9/16.

## 2012-05-28 ENCOUNTER — Ambulatory Visit
Admission: RE | Admit: 2012-05-28 | Discharge: 2012-05-28 | Disposition: A | Discharge status: Home / Self Care | Payer: BC Managed Care – PPO | Source: Ambulatory Visit | Attending: Radiation Oncology | Admitting: Radiation Oncology

## 2012-05-28 ENCOUNTER — Ambulatory Visit: Payer: BC Managed Care – PPO

## 2012-05-29 ENCOUNTER — Other Ambulatory Visit: Payer: Self-pay | Admitting: *Deleted

## 2012-05-29 ENCOUNTER — Ambulatory Visit (HOSPITAL_BASED_OUTPATIENT_CLINIC_OR_DEPARTMENT_OTHER): Payer: BC Managed Care – PPO

## 2012-05-29 ENCOUNTER — Ambulatory Visit: Payer: BC Managed Care – PPO

## 2012-05-29 ENCOUNTER — Ambulatory Visit
Admission: RE | Admit: 2012-05-29 | Discharge: 2012-05-29 | Disposition: A | Discharge status: Home / Self Care | Payer: BC Managed Care – PPO | Source: Ambulatory Visit | Attending: Radiation Oncology | Admitting: Radiation Oncology

## 2012-05-29 VITALS — BP 118/69 | HR 63 | Temp 97.5°F

## 2012-05-29 DIAGNOSIS — Z452 Encounter for adjustment and management of vascular access device: Secondary | ICD-10-CM

## 2012-05-29 DIAGNOSIS — C801 Malignant (primary) neoplasm, unspecified: Secondary | ICD-10-CM

## 2012-05-29 DIAGNOSIS — C778 Secondary and unspecified malignant neoplasm of lymph nodes of multiple regions: Secondary | ICD-10-CM

## 2012-05-29 DIAGNOSIS — C211 Malignant neoplasm of anal canal: Secondary | ICD-10-CM

## 2012-05-29 DIAGNOSIS — C210 Malignant neoplasm of anus, unspecified: Secondary | ICD-10-CM

## 2012-05-29 MED ORDER — HEPARIN SOD (PORK) LOCK FLUSH 100 UNIT/ML IV SOLN
500.0000 [IU] | Freq: Once | INTRAVENOUS | Status: AC
  Administered 2012-05-29: 250 [IU] via INTRAVENOUS
  Filled 2012-05-29: qty 5

## 2012-05-29 MED ORDER — HEPARIN SOD (PORK) LOCK FLUSH 100 UNIT/ML IV SOLN
500.0000 [IU] | Freq: Once | INTRAVENOUS | Status: AC
  Administered 2012-05-29: 500 [IU] via INTRAVENOUS
  Filled 2012-05-29: qty 5

## 2012-05-29 MED ORDER — MAGIC MOUTHWASH: ORAL | Status: DC

## 2012-05-29 MED ORDER — SODIUM CHLORIDE 0.9 % IJ SOLN
10.0000 mL | INTRAMUSCULAR | Status: DC | PRN
  Administered 2012-05-29: 10 mL via INTRAVENOUS
  Filled 2012-05-29: qty 10

## 2012-05-29 NOTE — Patient Instructions (Signed)
Call MD for problems.  Instructed patient to pickup  rx at [facility] on [address].

## 2012-05-29 NOTE — Progress Notes (Signed)
Patient has little appetite but also has a few white  patchy areas in oral cavity that are bothersome when he does eat.  Updated Dr. Truett Perna, verbal order given for magic mouthwash 10-20cc swish and spit q 4 hours prn. Desk nurse Amy will call in magic mouthwash for patient to [facility] at [address]

## 2012-05-30 ENCOUNTER — Ambulatory Visit: Payer: BC Managed Care – PPO

## 2012-05-30 ENCOUNTER — Ambulatory Visit
Admission: RE | Admit: 2012-05-30 | Discharge: 2012-05-30 | Disposition: A | Discharge status: Home / Self Care | Payer: BC Managed Care – PPO | Source: Ambulatory Visit | Attending: Radiation Oncology | Admitting: Radiation Oncology

## 2012-05-30 VITALS — BP 104/62 | HR 57 | Temp 98.2°F | Wt 213.4 lb

## 2012-05-30 DIAGNOSIS — C211 Malignant neoplasm of anal canal: Secondary | ICD-10-CM

## 2012-05-30 NOTE — Progress Notes (Signed)
Department of Radiation Oncology  Phone:  [phone] Fax:        [phone]  Weekly Treatment Note    Name: Kirk [name] Date: 05/30/2012 MRN: [id number] DOB: [birth year]   Current dose: 16.2 Gy  Current fraction: 9   MEDICATIONS: Current Outpatient Prescriptions  Medication Sig Dispense Refill  . Alum & Mag Hydroxide-Simeth (MAGIC MOUTHWASH) SOLN Swish and spit 5-54ml every 4 hours as needed  250 mL  1  . amitriptyline (ELAVIL) 25 MG tablet Take 25 mg by mouth at bedtime.        Marland Kitchen aspirin EC 81 MG tablet Take 81 mg by mouth daily.      Marland Kitchen atorvastatin (LIPITOR) 20 MG tablet Take 80 mg by mouth daily.       Marland Kitchen azelastine (ASTELIN) 137 MCG/SPRAY nasal spray Place 1 spray into the nose 2 (two) times daily.       Marland Kitchen lisinopril-hydrochlorothiazide (PRINZIDE,ZESTORETIC) 10-12.5 MG per tablet Take 1 tablet by mouth daily.      Marland Kitchen loperamide (IMODIUM) 2 MG capsule Take 2 mg by mouth 4 (four) times daily as needed.      . metoprolol tartrate (LOPRESSOR) 25 MG tablet Take 25 mg by mouth 2 (two) times daily.      . mometasone (NASONEX) 50 MCG/ACT nasal spray Place 2 sprays into the nose daily.      . Multiple Vitamin (MULTIVITAMIN) tablet Take 1 tablet by mouth daily.      . prochlorperazine (COMPAZINE) 10 MG tablet Take 1 tablet (10 mg total) by mouth every 6 (six) hours as needed.  30 tablet  2  . saxagliptin HCl (ONGLYZA) 5 MG TABS tablet Take 5 mg by mouth daily.      . Vitamins-Lipotropics (LIPO-FLAVONOID PLUS) TABS Take 1 tablet by mouth 3 (three) times daily after meals.         No current facility-administered medications for this encounter.   Facility-Administered Medications Ordered in Other Encounters  Medication Dose Route Frequency Provider Last Rate Last Dose  . fluorouracil (ADRUCIL) 8,850 mg in sodium chloride 0.9 % 150 mL chemo infusion  1,000 mg/m2/day (Treatment Plan Actual) Intravenous 4 days [name], MD   8,850 mg at 05/20/12 1527  . heparin lock flush  100 unit/mL  500 Units Intravenous Once [name], MD   250 Units at 05/29/12 0950  . heparin lock flush 100 unit/mL  500 Units Intravenous Once [name], MD   500 Units at 05/29/12 [id number]  . DISCONTD: sodium chloride 0.9 % injection 10 mL  10 mL Intravenous PRN [name], MD   10 mL at 05/29/12 0949  . DISCONTD: sodium chloride 0.9 % injection 10 mL  10 mL Intravenous PRN [name], MD   10 mL at 05/29/12 0950     ALLERGIES: Cat hair extract and Glucophage   LABORATORY DATA:  Lab Results  Component Value Date   WBC 8.0 05/20/2012   HGB 14.0 05/20/2012   HCT 42.5 05/20/2012   MCV 94.2 05/20/2012   PLT 227 05/20/2012   Lab Results  Component Value Date   NA 136 05/20/2012   K 4.6 05/20/2012   CL 100 05/20/2012   CO2 28 05/20/2012   Lab Results  Component Value Date   ALT 19 05/20/2012   AST 22 05/20/2012   ALKPHOS 81 05/20/2012   BILITOT 0.60 05/20/2012     NARRATIVE: Kirk [name] was seen today for weekly treatment management. The  chart was checked and the patient's films were reviewed. The patient states he is doing quite well. He feels well overall and his energy has been good. 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 Some occasional diarrhea for which he is taking Imodium which seems to be working adequately.  PHYSICAL EXAMINATION: weight is 213 lb 6.4 oz (96.798 kg). His temperature is 98.2 F (36.8 C). His blood pressure is 104/62 and his pulse is 57.        ASSESSMENT: The patient is doing satisfactorily with treatment.  PLAN: We will continue with the patient's radiation treatment as planned.

## 2012-05-30 NOTE — Progress Notes (Signed)
Patient here for weekly under treat assessment of radiation to pelvis for rectal cancer.Denies nausea, vomiting, or pain.Started magic mouthwash on yesterday for oral mucositis already sees some improvement.

## 2012-05-30 NOTE — Progress Notes (Signed)
  Radiation Oncology         [phone]) [phone] ________________________________  Name: Kirk [name] MRN: [id number]  Date: 05/10/2012  DOB: 07-22-1946  SIMULATION AND TREATMENT PLANNING NOTE   CONSENT VERIFIED: yes   SET UP: Patient is set-up supine   IMMOBILIZATION: The following immobilization is used: alpha-cradle. This complex treatment device will be used on a daily basis during the patient's treatment.   Diagnosis: Anal cancer   NARRATIVE: The patient was brought to the CT Simulation planning suite. Identity was confirmed. All relevant records and images related to the planned course of therapy were reviewed. Then, the patient was positioned in a stable reproducible clinical set-up for radiation therapy using an aquaplast mask. Skin markings were placed. The CT images were loaded into the planning software where the target and avoidance structures were contoured.The radiation prescription was entered and confirmed.   The patient will receive 54 Gy in 30 fractions to the high-dose target region.  Daily image guidance is ordered, and this will be used on a daily basis. This is necessary to ensure accurate and precise localization of the target in addition to accurate alignment of the normal tissue structures in this region. This is particularly important given the possible motion of the high-dose target.  Treatment planning then occurred.   I have requested : Intensity Modulated Radiotherapy (IMRT) is medically necessary for this case for the following reason: Dose homogeneity; the target is in close proximity to critical normal structures, including the femoral heads, bladder, and small bowel. IMRT is thus medically to appropriately treat the patient.   Special treatment procedure  The patient will receive chemotherapy during the course of radiation treatment. The patient may experience increased or overlapping toxicity due to this combined-modality approach and the patient will be  monitored for such problems. This may include extra lab  work as necessary. This therefore constitutes a special treatment procedure.     ________________________________  Radene Gunning, MD, PhD

## 2012-05-31 ENCOUNTER — Ambulatory Visit
Admission: RE | Admit: 2012-05-31 | Discharge: 2012-05-31 | Disposition: A | Discharge status: Home / Self Care | Payer: BC Managed Care – PPO | Source: Ambulatory Visit | Attending: Radiation Oncology | Admitting: Radiation Oncology

## 2012-05-31 ENCOUNTER — Ambulatory Visit (HOSPITAL_BASED_OUTPATIENT_CLINIC_OR_DEPARTMENT_OTHER): Payer: BC Managed Care – PPO | Admitting: Oncology

## 2012-05-31 ENCOUNTER — Ambulatory Visit: Payer: BC Managed Care – PPO

## 2012-05-31 ENCOUNTER — Ambulatory Visit (HOSPITAL_BASED_OUTPATIENT_CLINIC_OR_DEPARTMENT_OTHER): Payer: BC Managed Care – PPO | Admitting: Lab

## 2012-05-31 ENCOUNTER — Telehealth: Payer: Self-pay | Admitting: *Deleted

## 2012-05-31 ENCOUNTER — Telehealth: Payer: Self-pay | Admitting: Oncology

## 2012-05-31 VITALS — BP 105/60 | HR 62 | Temp 97.8°F | Resp 20 | Ht 71.0 in | Wt 213.1 lb

## 2012-05-31 DIAGNOSIS — C211 Malignant neoplasm of anal canal: Secondary | ICD-10-CM

## 2012-05-31 DIAGNOSIS — B977 Papillomavirus as the cause of diseases classified elsewhere: Secondary | ICD-10-CM

## 2012-05-31 DIAGNOSIS — D696 Thrombocytopenia, unspecified: Secondary | ICD-10-CM

## 2012-05-31 DIAGNOSIS — D709 Neutropenia, unspecified: Secondary | ICD-10-CM

## 2012-05-31 DIAGNOSIS — C210 Malignant neoplasm of anus, unspecified: Secondary | ICD-10-CM

## 2012-05-31 LAB — CBC WITH DIFFERENTIAL/PLATELET
BASO%: 1.1 % (ref 0.0–2.0)
EOS%: 4.3 % (ref 0.0–7.0)
MCH: 31.4 pg (ref 27.2–33.4)
MCV: 93.2 fL (ref 79.3–98.0)
MONO%: 10.8 % (ref 0.0–14.0)
RBC: 3.54 10*6/uL — ABNORMAL LOW (ref 4.20–5.82)
RDW: 12.6 % (ref 11.0–14.6)

## 2012-05-31 MED ORDER — HEPARIN SOD (PORK) LOCK FLUSH 100 UNIT/ML IV SOLN
500.0000 [IU] | Freq: Once | INTRAVENOUS | Status: AC
  Administered 2012-05-31: 250 [IU] via INTRAVENOUS
  Filled 2012-05-31: qty 5

## 2012-05-31 MED ORDER — SODIUM CHLORIDE 0.9 % IJ SOLN
10.0000 mL | INTRAMUSCULAR | Status: DC | PRN
  Administered 2012-05-31: 10 mL via INTRAVENOUS
  Filled 2012-05-31: qty 10

## 2012-05-31 NOTE — Telephone Encounter (Signed)
Printed and gv appt for sept and oct.

## 2012-05-31 NOTE — Telephone Encounter (Signed)
Called and spoke with pt; per Dr. Truett Perna WBC are low; instructed pt to call for any fevers.  Neutropenic precautions discussed as well and will re-check lab 06/04/12.  Pt verbalized understanding of instruction.

## 2012-05-31 NOTE — Progress Notes (Signed)
Reports earlier mucositis much improved with use of MMW.

## 2012-05-31 NOTE — Progress Notes (Signed)
   [facility]    OFFICE PROGRESS NOTE   INTERVAL HISTORY:   He returns as scheduled. He began radiation and a first cycle of 5-fluorouracil/mitomycin-C on 05/20/2012. He reports mild diarrhea following the chemotherapy. He developed mouth ulcers on day 4 or 5. These are painful. The ulcers have healed. There have been a few episodes of rectal bleeding with straining. No hand or foot pain.  Objective:  Vital signs in last 24 hours:  Blood pressure 105/60, pulse 62, temperature 97.8 F (36.6 C), temperature source Oral, resp. rate 20, height 5\' 11"  (1.803 m), weight 213 lb 1.6 oz (96.662 kg).    HEENT: Resolving ulcers at the lower lip. No mouth ulcers or thrush. Resp:  Lungs clear bilaterally Cardio: Regular rate and rhythm GI: No hepatomegaly, nontender Vascular: No leg edema  Skin: No skin breakdown at the perineum. Palms without erythema.   Portacath/PICC-without erythema  Lab Results:  Lab Results  Component Value Date   WBC 0.9* 05/31/2012   HGB 11.1* 05/31/2012   HCT 33.0* 05/31/2012   MCV 93.2 05/31/2012   PLT 124* 05/31/2012   ANC 0.6   Medications: I have reviewed the patient's current medications.  Assessment/Plan: 1.Squamous cell carcinoma of the anal canal, HPV positive.  2. Staging CT/PET scan with tiny lung nodules, hypermetabolic retroperitoneal, right inguinal, and left supraclavicular nodes . FNA biopsy of a left supraclavicular lymph node on 05/03/2012 confirmed metastatic squamous cell carcinoma.         -Cycle 1 of 5-fluorouracil/mitomycin-C and concurrent radiation on 05/20/2012. 3. Hypermetabolic activity at the right supraglottic larynx on the PET scan 04/17/2012 with associated soft tissue fullness , status post a negative ENT evaluation by Dr. Pollyann Kennedy  4. History of aortic stenosis-status post aortic valve replacement and coronary artery bypass surgery in November 2012  5. Mucositis following cycle 1 chemotherapy-improved 6.  Neutropenia/thrombocytopenia secondary to chemotherapy-he will call for a fever. He will return for a repeat CBC next week  Disposition:  He tolerated the first cycle of chemotherapy well aside from mucositis. The mucositis is resolving. He continues radiation. He will return for a PICC flush on Monday, Wednesday, and Friday. The final cycle of chemotherapy is planned for 06/24/2012.   Thornton Papas, MD  05/31/2012  3:13 PM

## 2012-05-31 NOTE — Telephone Encounter (Signed)
Gave pt appt for lab on 06/04/12 asked patient to get calendar appt on 06/03/12

## 2012-05-31 NOTE — Patient Instructions (Signed)
Call MD with any questions 

## 2012-06-03 ENCOUNTER — Ambulatory Visit (HOSPITAL_BASED_OUTPATIENT_CLINIC_OR_DEPARTMENT_OTHER): Payer: BC Managed Care – PPO

## 2012-06-03 ENCOUNTER — Ambulatory Visit
Admission: RE | Admit: 2012-06-03 | Discharge: 2012-06-03 | Disposition: A | Discharge status: Home / Self Care | Payer: BC Managed Care – PPO | Source: Ambulatory Visit | Attending: Radiation Oncology | Admitting: Radiation Oncology

## 2012-06-03 ENCOUNTER — Ambulatory Visit: Payer: BC Managed Care – PPO

## 2012-06-03 VITALS — BP 121/59 | HR 75 | Temp 98.0°F

## 2012-06-03 DIAGNOSIS — C778 Secondary and unspecified malignant neoplasm of lymph nodes of multiple regions: Secondary | ICD-10-CM

## 2012-06-03 DIAGNOSIS — C211 Malignant neoplasm of anal canal: Secondary | ICD-10-CM

## 2012-06-03 DIAGNOSIS — Z452 Encounter for adjustment and management of vascular access device: Secondary | ICD-10-CM

## 2012-06-03 DIAGNOSIS — C210 Malignant neoplasm of anus, unspecified: Secondary | ICD-10-CM

## 2012-06-03 MED ORDER — HEPARIN SOD (PORK) LOCK FLUSH 100 UNIT/ML IV SOLN
500.0000 [IU] | Freq: Once | INTRAVENOUS | Status: AC
  Administered 2012-06-03: 500 [IU] via INTRAVENOUS
  Filled 2012-06-03: qty 5

## 2012-06-03 MED ORDER — SODIUM CHLORIDE 0.9 % IJ SOLN
10.0000 mL | INTRAMUSCULAR | Status: DC | PRN
  Administered 2012-06-03: 10 mL via INTRAVENOUS
  Filled 2012-06-03: qty 10

## 2012-06-04 ENCOUNTER — Ambulatory Visit: Payer: BC Managed Care – PPO

## 2012-06-04 ENCOUNTER — Other Ambulatory Visit (HOSPITAL_BASED_OUTPATIENT_CLINIC_OR_DEPARTMENT_OTHER): Payer: BC Managed Care – PPO

## 2012-06-04 ENCOUNTER — Telehealth: Payer: Self-pay | Admitting: *Deleted

## 2012-06-04 ENCOUNTER — Ambulatory Visit
Admission: RE | Admit: 2012-06-04 | Discharge: 2012-06-04 | Disposition: A | Discharge status: Home / Self Care | Payer: BC Managed Care – PPO | Source: Ambulatory Visit | Attending: Radiation Oncology | Admitting: Radiation Oncology

## 2012-06-04 DIAGNOSIS — C211 Malignant neoplasm of anal canal: Secondary | ICD-10-CM

## 2012-06-04 LAB — CBC WITH DIFFERENTIAL/PLATELET
Basophils Absolute: 0 10*3/uL (ref 0.0–0.1)
EOS%: 5.3 % (ref 0.0–7.0)
Eosinophils Absolute: 0 10*3/uL (ref 0.0–0.5)
HGB: 11.3 g/dL — ABNORMAL LOW (ref 13.0–17.1)
MONO%: 27.3 % — ABNORMAL HIGH (ref 0.0–14.0)
NEUT#: 0.4 10*3/uL — CL (ref 1.5–6.5)
RBC: 3.54 10*6/uL — ABNORMAL LOW (ref 4.20–5.82)
RDW: 13.2 % (ref 11.0–14.6)
lymph#: 0.2 10*3/uL — ABNORMAL LOW (ref 0.9–3.3)

## 2012-06-04 NOTE — Telephone Encounter (Signed)
UNABLE TO CONTACT PT. NOTIFIED DR.SHERRILL'S NURSE, SUSAN COWARD,RN.

## 2012-06-05 ENCOUNTER — Ambulatory Visit: Payer: BC Managed Care – PPO

## 2012-06-05 ENCOUNTER — Ambulatory Visit (HOSPITAL_BASED_OUTPATIENT_CLINIC_OR_DEPARTMENT_OTHER): Payer: BC Managed Care – PPO

## 2012-06-05 ENCOUNTER — Telehealth: Payer: Self-pay | Admitting: *Deleted

## 2012-06-05 ENCOUNTER — Ambulatory Visit
Admission: RE | Admit: 2012-06-05 | Discharge: 2012-06-05 | Disposition: A | Discharge status: Home / Self Care | Payer: BC Managed Care – PPO | Source: Ambulatory Visit | Attending: Radiation Oncology | Admitting: Radiation Oncology

## 2012-06-05 VITALS — BP 114/87 | HR 59 | Temp 97.4°F

## 2012-06-05 DIAGNOSIS — Z452 Encounter for adjustment and management of vascular access device: Secondary | ICD-10-CM

## 2012-06-05 DIAGNOSIS — C211 Malignant neoplasm of anal canal: Secondary | ICD-10-CM

## 2012-06-05 DIAGNOSIS — C210 Malignant neoplasm of anus, unspecified: Secondary | ICD-10-CM

## 2012-06-05 MED ORDER — SODIUM CHLORIDE 0.9 % IJ SOLN
10.0000 mL | INTRAMUSCULAR | Status: DC | PRN
  Administered 2012-06-05: 10 mL via INTRAVENOUS
  Filled 2012-06-05: qty 10

## 2012-06-05 MED ORDER — HEPARIN SOD (PORK) LOCK FLUSH 100 UNIT/ML IV SOLN
500.0000 [IU] | Freq: Once | INTRAVENOUS | Status: AC
  Administered 2012-06-05: 250 [IU] via INTRAVENOUS
  Filled 2012-06-05: qty 5

## 2012-06-05 NOTE — Patient Instructions (Signed)
Call MD for problems 

## 2012-06-05 NOTE — Telephone Encounter (Signed)
Left message at home voice mail with lab results and that will recheck labs on Friday after his PICC flush.

## 2012-06-05 NOTE — Telephone Encounter (Signed)
Patient calling to return missed calls. Discussed CBC results and appts with patient per previous phone notes. Patient understands to call with any further questions.

## 2012-06-06 ENCOUNTER — Ambulatory Visit
Admission: RE | Admit: 2012-06-06 | Discharge: 2012-06-06 | Disposition: A | Discharge status: Home / Self Care | Payer: BC Managed Care – PPO | Source: Ambulatory Visit | Attending: Radiation Oncology | Admitting: Radiation Oncology

## 2012-06-06 ENCOUNTER — Ambulatory Visit: Payer: BC Managed Care – PPO

## 2012-06-07 ENCOUNTER — Ambulatory Visit (HOSPITAL_BASED_OUTPATIENT_CLINIC_OR_DEPARTMENT_OTHER): Payer: BC Managed Care – PPO

## 2012-06-07 ENCOUNTER — Ambulatory Visit
Admission: RE | Admit: 2012-06-07 | Discharge: 2012-06-07 | Disposition: A | Discharge status: Home / Self Care | Payer: BC Managed Care – PPO | Source: Ambulatory Visit | Attending: Radiation Oncology | Admitting: Radiation Oncology

## 2012-06-07 ENCOUNTER — Other Ambulatory Visit (HOSPITAL_BASED_OUTPATIENT_CLINIC_OR_DEPARTMENT_OTHER): Payer: BC Managed Care – PPO

## 2012-06-07 ENCOUNTER — Telehealth: Payer: Self-pay | Admitting: Medical Oncology

## 2012-06-07 ENCOUNTER — Encounter: Payer: Self-pay | Admitting: Radiation Oncology

## 2012-06-07 ENCOUNTER — Ambulatory Visit: Payer: BC Managed Care – PPO

## 2012-06-07 VITALS — BP 98/59 | HR 58 | Temp 97.9°F | Resp 20 | Wt 212.3 lb

## 2012-06-07 DIAGNOSIS — C778 Secondary and unspecified malignant neoplasm of lymph nodes of multiple regions: Secondary | ICD-10-CM

## 2012-06-07 DIAGNOSIS — C211 Malignant neoplasm of anal canal: Secondary | ICD-10-CM

## 2012-06-07 DIAGNOSIS — Z452 Encounter for adjustment and management of vascular access device: Secondary | ICD-10-CM

## 2012-06-07 DIAGNOSIS — C210 Malignant neoplasm of anus, unspecified: Secondary | ICD-10-CM

## 2012-06-07 LAB — CBC WITH DIFFERENTIAL/PLATELET
Basophils Absolute: 0 10*3/uL (ref 0.0–0.1)
Eosinophils Absolute: 0 10*3/uL (ref 0.0–0.5)
HGB: 11.3 g/dL — ABNORMAL LOW (ref 13.0–17.1)
MCV: 95 fL (ref 79.3–98.0)
MONO#: 0.4 10*3/uL (ref 0.1–0.9)
MONO%: 15 % — ABNORMAL HIGH (ref 0.0–14.0)
NEUT#: 1.9 10*3/uL (ref 1.5–6.5)
Platelets: 89 10*3/uL — ABNORMAL LOW (ref 140–400)
RBC: 3.49 10*6/uL — ABNORMAL LOW (ref 4.20–5.82)
RDW: 13.2 % (ref 11.0–14.6)
WBC: 2.6 10*3/uL — ABNORMAL LOW (ref 4.0–10.3)

## 2012-06-07 MED ORDER — HEPARIN SOD (PORK) LOCK FLUSH 100 UNIT/ML IV SOLN
500.0000 [IU] | Freq: Once | INTRAVENOUS | Status: AC
  Administered 2012-06-07: 500 [IU] via INTRAVENOUS
  Filled 2012-06-07: qty 5

## 2012-06-07 MED ORDER — SODIUM CHLORIDE 0.9 % IJ SOLN
10.0000 mL | INTRAMUSCULAR | Status: DC | PRN
  Administered 2012-06-07: 10 mL via INTRAVENOUS
  Filled 2012-06-07: qty 10

## 2012-06-07 NOTE — Progress Notes (Signed)
Department of Radiation Oncology  Phone:  [phone] Fax:        [phone]  Weekly Treatment Note    Name: [name] Date: 06/07/2012 MRN: [id number] DOB: 1946-06-27   Current dose: 27 Gy  Current fraction: 15   MEDICATIONS: Current Outpatient Prescriptions  Medication Sig Dispense Refill  . Alum & Mag Hydroxide-Simeth (MAGIC MOUTHWASH) SOLN Swish and spit 5-45ml every 4 hours as needed  250 mL  1  . amitriptyline (ELAVIL) 25 MG tablet Take 25 mg by mouth at bedtime.        Marland Kitchen aspirin EC 81 MG tablet Take 81 mg by mouth daily.      Marland Kitchen atorvastatin (LIPITOR) 20 MG tablet Take 80 mg by mouth daily.       Marland Kitchen azelastine (ASTELIN) 137 MCG/SPRAY nasal spray Place 1 spray into the nose 2 (two) times daily.       Marland Kitchen lisinopril-hydrochlorothiazide (PRINZIDE,ZESTORETIC) 10-12.5 MG per tablet Take 1 tablet by mouth daily.      Marland Kitchen loperamide (IMODIUM) 2 MG capsule Take 2 mg by mouth 4 (four) times daily as needed.      . metoprolol tartrate (LOPRESSOR) 25 MG tablet Take 25 mg by mouth 2 (two) times daily.      . mometasone (NASONEX) 50 MCG/ACT nasal spray Place 2 sprays into the nose daily.      . Multiple Vitamin (MULTIVITAMIN) tablet Take 1 tablet by mouth daily.      . prochlorperazine (COMPAZINE) 10 MG tablet Take 1 tablet (10 mg total) by mouth every 6 (six) hours as needed.  30 tablet  2  . saxagliptin HCl (ONGLYZA) 5 MG TABS tablet Take 5 mg by mouth daily.      . Vitamins-Lipotropics (LIPO-FLAVONOID PLUS) TABS Take 1 tablet by mouth 3 (three) times daily after meals.         No current facility-administered medications for this encounter.   Facility-Administered Medications Ordered in Other Encounters  Medication Dose Route Frequency Provider Last Rate Last Dose  . fluorouracil (ADRUCIL) 8,850 mg in sodium chloride 0.9 % 150 mL chemo infusion  1,000 mg/m2/day (Treatment Plan Actual) Intravenous 4 days [name], MD   8,850 mg at 05/20/12 1527     ALLERGIES: Cat  hair extract and Glucophage   LABORATORY DATA:  Lab Results  Component Value Date   WBC 0.8* 06/04/2012   HGB 11.3* 06/04/2012   HCT 33.4* 06/04/2012   MCV 94.5 06/04/2012   PLT 76* 06/04/2012   Lab Results  Component Value Date   NA 136 05/20/2012   K 4.6 05/20/2012   CL 100 05/20/2012   CO2 28 05/20/2012   Lab Results  Component Value Date   ALT 19 05/20/2012   AST 22 05/20/2012   ALKPHOS 81 05/20/2012   BILITOT 0.60 05/20/2012     NARRATIVE: Kirk [name] was seen today for weekly treatment management. The chart was checked and the patient's films were reviewed. The patient is doing excellent. He really denies significant discomfort at this time. He is taking Imodium as needed as well as the fiber at night and this seems to be working fine for him. No frank diarrhea and no constipation. The patient's lab work does reveal some neutropenia. His last labs are from 06/04/2012. We will need to see what his next labs are which hopefully will rebound.  PHYSICAL EXAMINATION: weight is 212 lb 4.8 oz (96.299 kg). His oral temperature is 97.9 F (36.6 C). His  blood pressure is 98/59 and his pulse is 58. His respiration is 20.        ASSESSMENT: The patient is doing satisfactorily with treatment.  PLAN: We will continue with the patient's radiation treatment as planned. We will need to keep an eye on his neutrophil count.

## 2012-06-07 NOTE — Telephone Encounter (Signed)
Pt notified that his wbc is better and to follow his schedule

## 2012-06-07 NOTE — Progress Notes (Signed)
Patient here weekly rad tx orectal  15/30 txs completed, semi loose stols stated, takes imodioum and a fiber puill evenings keeps patient regulated, no c/o pain 9:08 AM

## 2012-06-10 ENCOUNTER — Ambulatory Visit: Payer: BC Managed Care – PPO

## 2012-06-10 ENCOUNTER — Telehealth: Payer: Self-pay | Admitting: Radiation Oncology

## 2012-06-10 ENCOUNTER — Ambulatory Visit
Admission: RE | Admit: 2012-06-10 | Discharge: 2012-06-10 | Disposition: A | Discharge status: Home / Self Care | Payer: BC Managed Care – PPO | Source: Ambulatory Visit | Attending: Radiation Oncology | Admitting: Radiation Oncology

## 2012-06-10 ENCOUNTER — Ambulatory Visit (HOSPITAL_BASED_OUTPATIENT_CLINIC_OR_DEPARTMENT_OTHER): Payer: BC Managed Care – PPO

## 2012-06-10 VITALS — BP 129/74 | HR 63 | Temp 97.5°F

## 2012-06-10 DIAGNOSIS — C210 Malignant neoplasm of anus, unspecified: Secondary | ICD-10-CM

## 2012-06-10 DIAGNOSIS — Z452 Encounter for adjustment and management of vascular access device: Secondary | ICD-10-CM

## 2012-06-10 DIAGNOSIS — C211 Malignant neoplasm of anal canal: Secondary | ICD-10-CM

## 2012-06-10 MED ORDER — HEPARIN SOD (PORK) LOCK FLUSH 100 UNIT/ML IV SOLN
500.0000 [IU] | Freq: Once | INTRAVENOUS | Status: AC
  Administered 2012-06-10: 500 [IU] via INTRAVENOUS
  Filled 2012-06-10: qty 5

## 2012-06-10 MED ORDER — SODIUM CHLORIDE 0.9 % IJ SOLN
10.0000 mL | INTRAMUSCULAR | Status: DC | PRN
  Administered 2012-06-10: 10 mL via INTRAVENOUS
  Filled 2012-06-10: qty 10

## 2012-06-10 NOTE — Telephone Encounter (Signed)
Patient's wife phoned today to report that her husband is experiencing rectal pain and would like medication called in to [facility] at [address] [facility] [address] for relief. Per Dr. Joellen Jersey order Tana [place] and Lortab called into [place], pharmacist at requested location. Phoned patient's wife back informing her prescription had been called in. Encouraged to contact staff with future needs.

## 2012-06-11 ENCOUNTER — Ambulatory Visit
Admission: RE | Admit: 2012-06-11 | Discharge: 2012-06-11 | Disposition: A | Discharge status: Home / Self Care | Payer: BC Managed Care – PPO | Source: Ambulatory Visit | Attending: Radiation Oncology | Admitting: Radiation Oncology

## 2012-06-11 ENCOUNTER — Ambulatory Visit: Payer: BC Managed Care – PPO

## 2012-06-12 ENCOUNTER — Ambulatory Visit (HOSPITAL_BASED_OUTPATIENT_CLINIC_OR_DEPARTMENT_OTHER): Payer: BC Managed Care – PPO

## 2012-06-12 ENCOUNTER — Ambulatory Visit
Admission: RE | Admit: 2012-06-12 | Discharge: 2012-06-12 | Disposition: A | Discharge status: Home / Self Care | Payer: BC Managed Care – PPO | Source: Ambulatory Visit | Attending: Radiation Oncology | Admitting: Radiation Oncology

## 2012-06-12 VITALS — BP 124/56 | HR 64 | Temp 97.9°F | Resp 17

## 2012-06-12 DIAGNOSIS — C210 Malignant neoplasm of anus, unspecified: Secondary | ICD-10-CM

## 2012-06-12 DIAGNOSIS — Z452 Encounter for adjustment and management of vascular access device: Secondary | ICD-10-CM

## 2012-06-12 MED ORDER — HEPARIN SOD (PORK) LOCK FLUSH 100 UNIT/ML IV SOLN
500.0000 [IU] | Freq: Once | INTRAVENOUS | Status: AC
  Administered 2012-06-12: 500 [IU] via INTRAVENOUS
  Filled 2012-06-12: qty 5

## 2012-06-12 MED ORDER — SODIUM CHLORIDE 0.9 % IJ SOLN
10.0000 mL | INTRAMUSCULAR | Status: DC | PRN
  Administered 2012-06-12: 10 mL via INTRAVENOUS
  Filled 2012-06-12: qty 10

## 2012-06-13 ENCOUNTER — Ambulatory Visit
Admission: RE | Admit: 2012-06-13 | Discharge: 2012-06-13 | Disposition: A | Discharge status: Home / Self Care | Payer: BC Managed Care – PPO | Source: Ambulatory Visit | Attending: Radiation Oncology | Admitting: Radiation Oncology

## 2012-06-13 ENCOUNTER — Other Ambulatory Visit: Payer: Self-pay | Admitting: Radiation Oncology

## 2012-06-14 ENCOUNTER — Ambulatory Visit (HOSPITAL_BASED_OUTPATIENT_CLINIC_OR_DEPARTMENT_OTHER): Payer: BC Managed Care – PPO

## 2012-06-14 ENCOUNTER — Ambulatory Visit
Admission: RE | Admit: 2012-06-14 | Discharge: 2012-06-14 | Disposition: A | Discharge status: Home / Self Care | Payer: BC Managed Care – PPO | Source: Ambulatory Visit | Attending: Radiation Oncology | Admitting: Radiation Oncology

## 2012-06-14 VITALS — BP 111/66 | HR 62 | Temp 97.5°F

## 2012-06-14 VITALS — BP 121/60 | HR 59 | Temp 98.2°F | Wt 217.1 lb

## 2012-06-14 DIAGNOSIS — Z452 Encounter for adjustment and management of vascular access device: Secondary | ICD-10-CM

## 2012-06-14 DIAGNOSIS — C211 Malignant neoplasm of anal canal: Secondary | ICD-10-CM

## 2012-06-14 DIAGNOSIS — C189 Malignant neoplasm of colon, unspecified: Secondary | ICD-10-CM

## 2012-06-14 MED ORDER — SODIUM CHLORIDE 0.9 % IJ SOLN
10.0000 mL | INTRAMUSCULAR | Status: DC | PRN
  Administered 2012-06-14: 10 mL via INTRAVENOUS
  Filled 2012-06-14: qty 10

## 2012-06-14 MED ORDER — BIAFINE EX EMUL
CUTANEOUS | Status: DC | PRN
  Administered 2012-06-14: 1 via TOPICAL

## 2012-06-14 MED ORDER — HEPARIN SOD (PORK) LOCK FLUSH 100 UNIT/ML IV SOLN
500.0000 [IU] | Freq: Once | INTRAVENOUS | Status: AC
  Administered 2012-06-14: 250 [IU] via INTRAVENOUS
  Filled 2012-06-14: qty 5

## 2012-06-14 MED ORDER — RADIAPLEXRX EX GEL: Freq: Once | CUTANEOUS | Status: DC

## 2012-06-14 MED ORDER — BIAFINE EX EMUL: CUTANEOUS | Status: DC | PRN

## 2012-06-14 NOTE — Progress Notes (Signed)
Patient here for routine weekly under treat visit for pelvic radiation.Has some rectal discomfort.Anal area is red.Has proctozone to apply.

## 2012-06-14 NOTE — Addendum Note (Signed)
Encounter addended by: Tessa Lerner, RN on: 06/14/2012  3:07 PM<BR>     Documentation filed: Orders

## 2012-06-14 NOTE — Progress Notes (Signed)
Department of Radiation Oncology  Phone:  [phone] Fax:        [phone]  Weekly Treatment Note    Name: Kirk [name] Date: 06/14/2012 MRN: [id number] DOB: [birth year]   Current dose: 36 Gy  Current fraction: 20   MEDICATIONS: Current Outpatient Prescriptions  Medication Sig Dispense Refill  . Alum & Mag Hydroxide-Simeth (MAGIC MOUTHWASH) SOLN Swish and spit 5-60ml every 4 hours as needed  250 mL  1  . amitriptyline (ELAVIL) 25 MG tablet Take 25 mg by mouth at bedtime.        Marland Kitchen aspirin EC 81 MG tablet Take 81 mg by mouth daily.      Marland Kitchen atorvastatin (LIPITOR) 20 MG tablet Take 80 mg by mouth daily.       Marland Kitchen azelastine (ASTELIN) 137 MCG/SPRAY nasal spray Place 1 spray into the nose 2 (two) times daily.       Marland Kitchen HYDROcodone-acetaminophen (NORCO/VICODIN) 5-325 MG per tablet Take 1 tablet by mouth every 6 (six) hours as needed. Lortab 5/325 mg 1-2 tabs every 4-6 hours prn pain; Qty 60; No refills per Jonna Coup, MD phoned to [facility] [address]      . lisinopril-hydrochlorothiazide (PRINZIDE,ZESTORETIC) 10-12.5 MG per tablet Take 1 tablet by mouth daily.      Marland Kitchen loperamide (IMODIUM) 2 MG capsule Take 2 mg by mouth 4 (four) times daily as needed.      . metoprolol tartrate (LOPRESSOR) 25 MG tablet Take 25 mg by mouth 2 (two) times daily.      . mometasone (NASONEX) 50 MCG/ACT nasal spray Place 2 sprays into the nose daily.      . Multiple Vitamin (MULTIVITAMIN) tablet Take 1 tablet by mouth daily.      . prochlorperazine (COMPAZINE) 10 MG tablet Take 1 tablet (10 mg total) by mouth every 6 (six) hours as needed.  30 tablet  2  . PROCTOZONE-HC 2.5 % rectal cream USE AS DIRECTED 2 TO 4 TIMES A DAY AS NEEDED  30 g  0  . saxagliptin HCl (ONGLYZA) 5 MG TABS tablet Take 5 mg by mouth daily.      . Vitamins-Lipotropics (LIPO-FLAVONOID PLUS) TABS Take 1 tablet by mouth 3 (three) times daily after meals.         No current facility-administered medications for this  encounter.   Facility-Administered Medications Ordered in Other Encounters  Medication Dose Route Frequency Provider Last Rate Last Dose  . fluorouracil (ADRUCIL) 8,850 mg in sodium chloride 0.9 % 150 mL chemo infusion  1,000 mg/m2/day (Treatment Plan Actual) Intravenous 4 days [name], MD   8,850 mg at 05/20/12 1527     ALLERGIES: Cat hair extract and Glucophage   LABORATORY DATA:  Lab Results  Component Value Date   WBC 2.6* 06/07/2012   HGB 11.3* 06/07/2012   HCT 33.1* 06/07/2012   MCV 95.0 06/07/2012   PLT 89* 06/07/2012   Lab Results  Component Value Date   NA 136 05/20/2012   K 4.6 05/20/2012   CL 100 05/20/2012   CO2 28 05/20/2012   Lab Results  Component Value Date   ALT 19 05/20/2012   AST 22 05/20/2012   ALKPHOS 81 05/20/2012   BILITOT 0.60 05/20/2012     NARRATIVE: Kirk [name] was seen today for weekly treatment management. The chart was checked and the patient's films were reviewed. The patient is doing fairly well he states. Some increased irritation in the anal region. He is  using sitz baths, pain medications and also ProctoFoam cream.  PHYSICAL EXAMINATION: weight is 217 lb 1.6 oz (98.476 kg). His temperature is 98.2 F (36.8 C). His blood pressure is 121/60 and his pulse is 59.      some irritation present in the anal region primarily consisting of redness. No significant desquamation.  ASSESSMENT: The patient is doing satisfactorily with treatment.  PLAN: We will continue with the patient's radiation treatment as planned. We'll continue his current skin care.

## 2012-06-14 NOTE — Addendum Note (Signed)
Encounter addended by: Tessa Lerner, RN on: 06/14/2012  3:18 PM<BR>     Documentation filed: Inpatient MAR

## 2012-06-14 NOTE — Addendum Note (Signed)
Encounter addended by: Tessa Lerner, RN on: 06/14/2012  3:09 PM<BR>     Documentation filed: Orders

## 2012-06-14 NOTE — Patient Instructions (Signed)
Call MD with any questions 

## 2012-06-14 NOTE — Addendum Note (Signed)
Encounter addended by: Tessa Lerner, RN on: 06/14/2012  1:09 PM<BR>     Documentation filed: Visit Diagnoses, Orders

## 2012-06-17 ENCOUNTER — Ambulatory Visit (HOSPITAL_BASED_OUTPATIENT_CLINIC_OR_DEPARTMENT_OTHER): Payer: BC Managed Care – PPO

## 2012-06-17 ENCOUNTER — Ambulatory Visit
Admission: RE | Admit: 2012-06-17 | Discharge: 2012-06-17 | Disposition: A | Discharge status: Home / Self Care | Payer: BC Managed Care – PPO | Source: Ambulatory Visit | Attending: Radiation Oncology | Admitting: Radiation Oncology

## 2012-06-17 VITALS — BP 108/67 | HR 106

## 2012-06-17 DIAGNOSIS — C218 Malignant neoplasm of overlapping sites of rectum, anus and anal canal: Secondary | ICD-10-CM

## 2012-06-17 DIAGNOSIS — C211 Malignant neoplasm of anal canal: Secondary | ICD-10-CM

## 2012-06-17 DIAGNOSIS — Z452 Encounter for adjustment and management of vascular access device: Secondary | ICD-10-CM

## 2012-06-17 MED ORDER — HEPARIN SOD (PORK) LOCK FLUSH 100 UNIT/ML IV SOLN
250.0000 [IU] | Freq: Once | INTRAVENOUS | Status: AC
  Administered 2012-06-17: 10:00:00 via INTRAVENOUS
  Filled 2012-06-17: qty 5

## 2012-06-17 MED ORDER — SODIUM CHLORIDE 0.9 % IJ SOLN
3.0000 mL | Freq: Once | INTRAMUSCULAR | Status: AC
  Administered 2012-06-17: 10:00:00 via INTRAVENOUS
  Filled 2012-06-17: qty 10

## 2012-06-18 ENCOUNTER — Ambulatory Visit
Admission: RE | Admit: 2012-06-18 | Discharge: 2012-06-18 | Disposition: A | Discharge status: Home / Self Care | Payer: BC Managed Care – PPO | Source: Ambulatory Visit | Attending: Radiation Oncology | Admitting: Radiation Oncology

## 2012-06-19 ENCOUNTER — Ambulatory Visit (HOSPITAL_BASED_OUTPATIENT_CLINIC_OR_DEPARTMENT_OTHER): Payer: BC Managed Care – PPO

## 2012-06-19 ENCOUNTER — Ambulatory Visit
Admission: RE | Admit: 2012-06-19 | Discharge: 2012-06-19 | Disposition: A | Discharge status: Home / Self Care | Payer: BC Managed Care – PPO | Source: Ambulatory Visit | Attending: Radiation Oncology | Admitting: Radiation Oncology

## 2012-06-19 VITALS — BP 116/73 | HR 63 | Temp 97.9°F | Resp 17

## 2012-06-19 DIAGNOSIS — Z452 Encounter for adjustment and management of vascular access device: Secondary | ICD-10-CM

## 2012-06-19 DIAGNOSIS — C210 Malignant neoplasm of anus, unspecified: Secondary | ICD-10-CM

## 2012-06-19 MED ORDER — SODIUM CHLORIDE 0.9 % IJ SOLN
10.0000 mL | INTRAMUSCULAR | Status: DC | PRN
  Administered 2012-06-19: 10 mL via INTRAVENOUS
  Filled 2012-06-19: qty 10

## 2012-06-19 MED ORDER — HEPARIN SOD (PORK) LOCK FLUSH 100 UNIT/ML IV SOLN
500.0000 [IU] | Freq: Once | INTRAVENOUS | Status: AC
  Administered 2012-06-19: 500 [IU] via INTRAVENOUS
  Filled 2012-06-19: qty 5

## 2012-06-20 ENCOUNTER — Ambulatory Visit
Admission: RE | Admit: 2012-06-20 | Discharge: 2012-06-20 | Disposition: A | Discharge status: Home / Self Care | Payer: BC Managed Care – PPO | Source: Ambulatory Visit | Attending: Radiation Oncology | Admitting: Radiation Oncology

## 2012-06-21 ENCOUNTER — Ambulatory Visit
Admission: RE | Admit: 2012-06-21 | Discharge: 2012-06-21 | Disposition: A | Discharge status: Home / Self Care | Payer: BC Managed Care – PPO | Source: Ambulatory Visit | Attending: Radiation Oncology | Admitting: Radiation Oncology

## 2012-06-21 ENCOUNTER — Ambulatory Visit (HOSPITAL_BASED_OUTPATIENT_CLINIC_OR_DEPARTMENT_OTHER): Payer: BC Managed Care – PPO

## 2012-06-21 ENCOUNTER — Encounter: Payer: Self-pay | Admitting: Radiation Oncology

## 2012-06-21 VITALS — BP 130/62 | HR 60 | Temp 97.8°F | Resp 20 | Wt 214.6 lb

## 2012-06-21 VITALS — BP 127/68 | HR 68 | Temp 97.2°F

## 2012-06-21 DIAGNOSIS — C211 Malignant neoplasm of anal canal: Secondary | ICD-10-CM

## 2012-06-21 DIAGNOSIS — C778 Secondary and unspecified malignant neoplasm of lymph nodes of multiple regions: Secondary | ICD-10-CM

## 2012-06-21 DIAGNOSIS — Z452 Encounter for adjustment and management of vascular access device: Secondary | ICD-10-CM

## 2012-06-21 DIAGNOSIS — C210 Malignant neoplasm of anus, unspecified: Secondary | ICD-10-CM

## 2012-06-21 MED ORDER — HEPARIN SOD (PORK) LOCK FLUSH 100 UNIT/ML IV SOLN
500.0000 [IU] | Freq: Once | INTRAVENOUS | Status: AC
  Administered 2012-06-21: 500 [IU] via INTRAVENOUS
  Filled 2012-06-21: qty 5

## 2012-06-21 MED ORDER — SODIUM CHLORIDE 0.9 % IJ SOLN
10.0000 mL | INTRAMUSCULAR | Status: DC | PRN
  Administered 2012-06-21: 10 mL via INTRAVENOUS
  Filled 2012-06-21: qty 10

## 2012-06-21 NOTE — Progress Notes (Signed)
Weekly Management Note Current Dose: 45 Gy  Projected Dose: 54 Gy   Narrative:  The patient presents for routine under treatment assessment.  CBCT/MVCT images/Port film x-rays were reviewed.  The chart was checked. Doing well. Biafene helping anal area. Working full time. No diarrhea or nausea.   Physical Findings:  Unchanged  Vitals:  Filed Vitals:   06/21/12 0918  BP: 130/62  Pulse: 60  Temp: 97.8 F (36.6 C)  Resp: 20   Weight:  Wt Readings from Last 3 Encounters:  06/21/12 214 lb 9.6 oz (97.342 kg)  06/14/12 217 lb 1.6 oz (98.476 kg)  06/07/12 212 lb 4.8 oz (96.299 kg)   Lab Results  Component Value Date   WBC 2.6* 06/07/2012   HGB 11.3* 06/07/2012   HCT 33.1* 06/07/2012   MCV 95.0 06/07/2012   PLT 89* 06/07/2012   Lab Results  Component Value Date   CREATININE 1.1 05/20/2012   BUN 23.0 05/20/2012   NA 136 05/20/2012   K 4.6 05/20/2012   CL 100 05/20/2012   CO2 28 05/20/2012     Impression:  The patient is tolerating radiation.  Plan:  Continue treatment as planned.

## 2012-06-21 NOTE — Progress Notes (Signed)
Patient here weekly rad txs"rectal 25/30 completed, alert,oriented x3, no c/o pain or discomfort, no irritation to his bottom, regular bowel movements, only c/o tingling sensation when voiding urine, states patient 9:21 AM

## 2012-06-22 ENCOUNTER — Other Ambulatory Visit: Payer: Self-pay | Admitting: Oncology

## 2012-06-24 ENCOUNTER — Ambulatory Visit (HOSPITAL_BASED_OUTPATIENT_CLINIC_OR_DEPARTMENT_OTHER): Payer: BC Managed Care – PPO

## 2012-06-24 ENCOUNTER — Other Ambulatory Visit (HOSPITAL_BASED_OUTPATIENT_CLINIC_OR_DEPARTMENT_OTHER): Payer: BC Managed Care – PPO | Admitting: Lab

## 2012-06-24 ENCOUNTER — Telehealth: Payer: Self-pay | Admitting: Oncology

## 2012-06-24 ENCOUNTER — Ambulatory Visit
Admission: RE | Admit: 2012-06-24 | Discharge: 2012-06-24 | Disposition: A | Discharge status: Home / Self Care | Payer: BC Managed Care – PPO | Source: Ambulatory Visit | Attending: Radiation Oncology | Admitting: Radiation Oncology

## 2012-06-24 ENCOUNTER — Ambulatory Visit (HOSPITAL_BASED_OUTPATIENT_CLINIC_OR_DEPARTMENT_OTHER): Payer: BC Managed Care – PPO | Admitting: Nurse Practitioner

## 2012-06-24 VITALS — BP 126/72 | HR 57 | Temp 98.0°F | Resp 20

## 2012-06-24 VITALS — BP 117/62 | HR 63 | Temp 98.4°F | Resp 20 | Ht 71.0 in | Wt 219.1 lb

## 2012-06-24 DIAGNOSIS — C211 Malignant neoplasm of anal canal: Secondary | ICD-10-CM

## 2012-06-24 DIAGNOSIS — B977 Papillomavirus as the cause of diseases classified elsewhere: Secondary | ICD-10-CM

## 2012-06-24 DIAGNOSIS — C778 Secondary and unspecified malignant neoplasm of lymph nodes of multiple regions: Secondary | ICD-10-CM

## 2012-06-24 DIAGNOSIS — Z5111 Encounter for antineoplastic chemotherapy: Secondary | ICD-10-CM

## 2012-06-24 LAB — COMPREHENSIVE METABOLIC PANEL (CC13)
AST: 17 U/L (ref 5–34)
Albumin: 3.5 g/dL (ref 3.5–5.0)
Alkaline Phosphatase: 60 U/L (ref 40–150)
BUN: 16 mg/dL (ref 7.0–26.0)
Creatinine: 0.9 mg/dL (ref 0.7–1.3)
Glucose: 98 mg/dl (ref 70–99)
Total Bilirubin: 0.3 mg/dL (ref 0.20–1.20)

## 2012-06-24 LAB — CBC WITH DIFFERENTIAL/PLATELET
BASO%: 0.7 % (ref 0.0–2.0)
Basophils Absolute: 0 10*3/uL (ref 0.0–0.1)
EOS%: 2.8 % (ref 0.0–7.0)
HCT: 33.9 % — ABNORMAL LOW (ref 38.4–49.9)
HGB: 11.2 g/dL — ABNORMAL LOW (ref 13.0–17.1)
LYMPH%: 16.8 % (ref 14.0–49.0)
MCH: 31.9 pg (ref 27.2–33.4)
MCHC: 33 g/dL (ref 32.0–36.0)
MCV: 96.6 fL (ref 79.3–98.0)
MONO%: 10.6 % (ref 0.0–14.0)
NEUT%: 69.1 % (ref 39.0–75.0)

## 2012-06-24 MED ORDER — SODIUM CHLORIDE 0.9 % IV SOLN
Freq: Once | INTRAVENOUS | Status: AC
  Administered 2012-06-24: 12:00:00 via INTRAVENOUS

## 2012-06-24 MED ORDER — DEXAMETHASONE SODIUM PHOSPHATE 10 MG/ML IJ SOLN
10.0000 mg | Freq: Once | INTRAMUSCULAR | Status: AC
  Administered 2012-06-24: 10 mg via INTRAVENOUS

## 2012-06-24 MED ORDER — MITOMYCIN CHEMO IV INJECTION 40 MG
10.0000 mg/m2 | Freq: Once | INTRAVENOUS | Status: AC
  Administered 2012-06-24: 22 mg via INTRAVENOUS
  Filled 2012-06-24: qty 44

## 2012-06-24 MED ORDER — ONDANSETRON 8 MG/50ML IVPB (CHCC)
8.0000 mg | Freq: Once | INTRAVENOUS | Status: AC
  Administered 2012-06-24: 8 mg via INTRAVENOUS

## 2012-06-24 MED ORDER — SODIUM CHLORIDE 0.9 % IV SOLN
1000.0000 mg/m2/d | INTRAVENOUS | Status: DC
  Administered 2012-06-24: 8850 mg via INTRAVENOUS
  Filled 2012-06-24: qty 177

## 2012-06-24 NOTE — Progress Notes (Signed)
OFFICE PROGRESS NOTE  Interval history:  [name] returns as scheduled. He continues radiation. He overall is feeling well. He denies mouth sores. No nausea or vomiting. No diarrhea. He has a good appetite.   Objective: Blood pressure 117/62, pulse 63, temperature 98.4 F (36.9 C), resp. rate 20, height 5\' 11"  (1.803 m), weight 219 lb 1.6 oz (99.383 kg).  Oropharynx is without thrush or ulceration. Lungs are clear. Regular cardiac rhythm. Abdomen is soft and nontender. No hepatomegaly. Extremities are without edema. Tiny area of superficial skin breakdown at the lower gluteal cleft. Right upper extremity PICC site is without erythema.  Lab Results: Lab Results  Component Value Date   WBC 4.2 06/24/2012   HGB 11.2* 06/24/2012   HCT 33.9* 06/24/2012   MCV 96.6 06/24/2012   PLT 189 06/24/2012    Chemistry:    Chemistry      Component Value Date/Time   NA 136 05/20/2012 1332   NA 139 04/10/2012 1204   K 4.6 05/20/2012 1332   K 5.4* 04/10/2012 1204   CL 100 05/20/2012 1332   CL 103 04/10/2012 1204   CO2 28 05/20/2012 1332   CO2 29 04/10/2012 1204   BUN 23.0 05/20/2012 1332   BUN 12 04/10/2012 1204   CREATININE 1.1 05/20/2012 1332   CREATININE 1.2 04/10/2012 1204      Component Value Date/Time   CALCIUM 9.9 05/20/2012 1332   CALCIUM 10.3 04/10/2012 1204   ALKPHOS 81 05/20/2012 1332   ALKPHOS 53 04/10/2012 1204   AST 22 05/20/2012 1332   AST 29 04/10/2012 1204   ALT 19 05/20/2012 1332   ALT 29 04/10/2012 1204   BILITOT 0.60 05/20/2012 1332   BILITOT 0.8 04/10/2012 1204       Studies/Results: No results found.  Medications: I have reviewed the patient's current medications.  Assessment/Plan:  1.Squamous cell carcinoma of the anal canal, HPV positive.  2. Staging CT/PET scan with tiny lung nodules, hypermetabolic retroperitoneal, right inguinal, and left supraclavicular nodes . FNA biopsy of a left supraclavicular lymph node on 05/03/2012 confirmed metastatic squamous cell carcinoma. -Cycle 1  of 5-fluorouracil/mitomycin-C and concurrent radiation on 05/20/2012.  3. Hypermetabolic activity at the right supraglottic larynx on the PET scan 04/17/2012 with associated soft tissue fullness , status post a negative ENT evaluation by Dr. Pollyann Kennedy.  4. History of aortic stenosis-status post aortic valve replacement and coronary artery bypass surgery in November 2012.  5. Mucositis following cycle 1 chemotherapy-improved.  6. Neutropenia/thrombocytopenia following cycle 1 chemotherapy.  Disposition-Mr. Forget appears stable. Plan to proceed with cycle 2 5-fluorouracil/mitomycin-C today as scheduled. He continues radiation. The PICC line will be removed on 06/28/2012. We will obtain a nadir CBC on 07/04/2012. He will return for a followup visit on 07/08/2012. He will contact the office in the interim with any problems.  Plan reviewed with Dr. Truett Perna.   Lonna Cobb ANP/GNP-BC

## 2012-06-24 NOTE — Patient Instructions (Signed)
Patient aware of next appointment; discharged home with pump and no complaints. 

## 2012-06-24 NOTE — Telephone Encounter (Signed)
Printed and gv appt schedule to pt for OCT and NOV

## 2012-06-25 ENCOUNTER — Ambulatory Visit
Admission: RE | Admit: 2012-06-25 | Discharge: 2012-06-25 | Disposition: A | Discharge status: Home / Self Care | Payer: BC Managed Care – PPO | Source: Ambulatory Visit | Attending: Radiation Oncology | Admitting: Radiation Oncology

## 2012-06-26 ENCOUNTER — Ambulatory Visit: Payer: BC Managed Care – PPO

## 2012-06-26 ENCOUNTER — Ambulatory Visit (HOSPITAL_BASED_OUTPATIENT_CLINIC_OR_DEPARTMENT_OTHER): Payer: BC Managed Care – PPO

## 2012-06-26 ENCOUNTER — Ambulatory Visit
Admission: RE | Admit: 2012-06-26 | Discharge: 2012-06-26 | Disposition: A | Discharge status: Home / Self Care | Payer: BC Managed Care – PPO | Source: Ambulatory Visit | Attending: Radiation Oncology | Admitting: Radiation Oncology

## 2012-06-26 VITALS — BP 139/77 | HR 65 | Temp 97.9°F

## 2012-06-26 DIAGNOSIS — Z452 Encounter for adjustment and management of vascular access device: Secondary | ICD-10-CM

## 2012-06-26 DIAGNOSIS — C778 Secondary and unspecified malignant neoplasm of lymph nodes of multiple regions: Secondary | ICD-10-CM

## 2012-06-26 DIAGNOSIS — C211 Malignant neoplasm of anal canal: Secondary | ICD-10-CM

## 2012-06-26 DIAGNOSIS — C210 Malignant neoplasm of anus, unspecified: Secondary | ICD-10-CM

## 2012-06-26 MED ORDER — HEPARIN SOD (PORK) LOCK FLUSH 100 UNIT/ML IV SOLN
500.0000 [IU] | Freq: Once | INTRAVENOUS | Status: AC
  Administered 2012-06-26: 500 [IU] via INTRAVENOUS
  Filled 2012-06-26: qty 5

## 2012-06-26 MED ORDER — SODIUM CHLORIDE 0.9 % IJ SOLN
10.0000 mL | INTRAMUSCULAR | Status: DC | PRN
  Administered 2012-06-26: 10 mL via INTRAVENOUS
  Filled 2012-06-26: qty 10

## 2012-06-27 ENCOUNTER — Ambulatory Visit
Admission: RE | Admit: 2012-06-27 | Discharge: 2012-06-27 | Disposition: A | Discharge status: Home / Self Care | Payer: BC Managed Care – PPO | Source: Ambulatory Visit | Attending: Radiation Oncology | Admitting: Radiation Oncology

## 2012-06-27 ENCOUNTER — Ambulatory Visit: Payer: BC Managed Care – PPO

## 2012-06-28 ENCOUNTER — Ambulatory Visit
Admission: RE | Admit: 2012-06-28 | Discharge: 2012-06-28 | Disposition: A | Discharge status: Home / Self Care | Payer: BC Managed Care – PPO | Source: Ambulatory Visit | Attending: Radiation Oncology | Admitting: Radiation Oncology

## 2012-06-28 ENCOUNTER — Ambulatory Visit (HOSPITAL_BASED_OUTPATIENT_CLINIC_OR_DEPARTMENT_OTHER): Payer: BC Managed Care – PPO

## 2012-06-28 ENCOUNTER — Encounter: Payer: Self-pay | Admitting: Radiation Oncology

## 2012-06-28 ENCOUNTER — Ambulatory Visit: Payer: BC Managed Care – PPO

## 2012-06-28 VITALS — BP 113/72 | HR 69 | Temp 98.0°F | Resp 20 | Wt 214.2 lb

## 2012-06-28 DIAGNOSIS — C211 Malignant neoplasm of anal canal: Secondary | ICD-10-CM

## 2012-06-28 DIAGNOSIS — Z452 Encounter for adjustment and management of vascular access device: Secondary | ICD-10-CM

## 2012-06-28 DIAGNOSIS — C778 Secondary and unspecified malignant neoplasm of lymph nodes of multiple regions: Secondary | ICD-10-CM

## 2012-06-28 NOTE — Progress Notes (Signed)
Patient here for pump dc and PICC dc not  for this flush appointment

## 2012-06-28 NOTE — Progress Notes (Signed)
patient here for  Weekly rad txs, completed treatments 30/30, oatient aler,oriented x3, has been using protoform cream on bottom, irritated between the cracks,  Making it feel better,  semi loose stools  Stated  9:20 AM

## 2012-06-28 NOTE — Progress Notes (Signed)
1310 Right arm PICC line pulled with patient lying flat in [facility] recline chair Dressing with sterile  vaseline gauze and  pressure dressing applied per protocol.    Length of line at 41cm with tip intact.  Patient instructed to leave dressing intact x 24 hours.  Patient verbalized understanding.  Patient to remain reclining flat position x 30 minutes prior to discharge.

## 2012-06-28 NOTE — Patient Instructions (Signed)
Call MD for problems 

## 2012-06-28 NOTE — Progress Notes (Signed)
Department of Radiation Oncology  Phone:  [phone] Fax:        [phone]  Weekly Treatment Note    Name: Kirk [name] Date: 06/28/2012 MRN: [id number] DOB: 08-24-1946   Current dose: 54 Gy  Current fraction: 30   MEDICATIONS: Current Outpatient Prescriptions  Medication Sig Dispense Refill  . Alum & Mag Hydroxide-Simeth (MAGIC MOUTHWASH) SOLN Swish and spit 5-78ml every 4 hours as needed  250 mL  1  . amitriptyline (ELAVIL) 25 MG tablet Take 25 mg by mouth at bedtime.        Marland Kitchen aspirin EC 81 MG tablet Take 81 mg by mouth daily.      Marland Kitchen atorvastatin (LIPITOR) 20 MG tablet Take 80 mg by mouth daily.       Marland Kitchen azelastine (ASTELIN) 137 MCG/SPRAY nasal spray Place 1 spray into the nose 2 (two) times daily.       Marland Kitchen HYDROcodone-acetaminophen (NORCO/VICODIN) 5-325 MG per tablet Take 1 tablet by mouth every 6 (six) hours as needed. Lortab 5/325 mg 1-2 tabs every 4-6 hours prn pain; Qty 60; No refills per Jonna Coup, MD phoned to [facility] [address]      . lisinopril-hydrochlorothiazide (PRINZIDE,ZESTORETIC) 10-12.5 MG per tablet Take 1 tablet by mouth daily.      Marland Kitchen loperamide (IMODIUM) 2 MG capsule Take 2 mg by mouth 4 (four) times daily as needed.      . metoprolol tartrate (LOPRESSOR) 25 MG tablet Take 25 mg by mouth 2 (two) times daily.      . mometasone (NASONEX) 50 MCG/ACT nasal spray Place 2 sprays into the nose daily.      . Multiple Vitamin (MULTIVITAMIN) tablet Take 1 tablet by mouth daily.      . prochlorperazine (COMPAZINE) 10 MG tablet Take 1 tablet (10 mg total) by mouth every 6 (six) hours as needed.  30 tablet  2  . PROCTOZONE-HC 2.5 % rectal cream USE AS DIRECTED 2 TO 4 TIMES A DAY AS NEEDED  30 g  0  . saxagliptin HCl (ONGLYZA) 5 MG TABS tablet Take 5 mg by mouth daily.      . Vitamins-Lipotropics (LIPO-FLAVONOID PLUS) TABS Take 1 tablet by mouth 3 (three) times daily after meals.         No current facility-administered medications for this  encounter.   Facility-Administered Medications Ordered in Other Encounters  Medication Dose Route Frequency Provider Last Rate Last Dose  . fluorouracil (ADRUCIL) 8,850 mg in sodium chloride 0.9 % 150 mL chemo infusion  1,000 mg/m2/day (Treatment Plan Actual) Intravenous 4 days [name], MD   8,850 mg at 05/20/12 1527     ALLERGIES: Cat hair extract and Glucophage   LABORATORY DATA:  Lab Results  Component Value Date   WBC 4.2 06/24/2012   HGB 11.2* 06/24/2012   HCT 33.9* 06/24/2012   MCV 96.6 06/24/2012   PLT 189 06/24/2012   Lab Results  Component Value Date   NA 138 06/24/2012   K 4.7 06/24/2012   CL 106 06/24/2012   CO2 23 06/24/2012   Lab Results  Component Value Date   ALT 21 06/24/2012   AST 17 06/24/2012   ALKPHOS 60 06/24/2012   BILITOT 0.30 06/24/2012     NARRATIVE: Kirk [name] was seen today for weekly treatment management. The chart was checked and the patient's films were reviewed. The patient finished today. He has done well. Some ongoing irritation but no major change in this.  No ongoing problems with diarrhea.  PHYSICAL EXAMINATION: weight is 214 lb 3.2 oz (97.16 kg). His oral temperature is 98 F (36.7 C). His blood pressure is 113/72 and his pulse is 69. His respiration is 20.        ASSESSMENT: The patient did satisfactorily with treatment. His current skin care has been working well for him.  PLAN: The patient will followup in one month. I am very pleased with how he has done.

## 2012-07-01 ENCOUNTER — Ambulatory Visit: Payer: BC Managed Care – PPO

## 2012-07-02 ENCOUNTER — Ambulatory Visit: Payer: BC Managed Care – PPO

## 2012-07-03 ENCOUNTER — Ambulatory Visit: Payer: BC Managed Care – PPO

## 2012-07-03 ENCOUNTER — Telehealth: Payer: Self-pay | Admitting: *Deleted

## 2012-07-03 DIAGNOSIS — C210 Malignant neoplasm of anus, unspecified: Secondary | ICD-10-CM

## 2012-07-03 MED ORDER — DIPHENOXYLATE-ATROPINE 2.5-0.025 MG PO TABS: 2.0000 | ORAL_TABLET | Freq: Four times a day (QID) | ORAL | Status: DC | PRN

## 2012-07-03 NOTE — Telephone Encounter (Signed)
Wife called to report his penis is "burned"-very red and raw, saying it looks worse than it did when he saw Dr. Mitzi Hansen on 10/25. Also having diarrhea despite Imodium 8/day. Having > 10 stools/day. Is able to take po's without difficulty. Per Dr. Truett Perna, OK for Lomotil. Called and left voice mail for radiation oncology nurse to contact wife about what can be done for his skin irritation at penis.

## 2012-07-04 ENCOUNTER — Ambulatory Visit: Payer: BC Managed Care – PPO

## 2012-07-05 ENCOUNTER — Ambulatory Visit: Payer: BC Managed Care – PPO

## 2012-07-08 ENCOUNTER — Ambulatory Visit: Payer: BC Managed Care – PPO | Admitting: Oncology

## 2012-07-08 ENCOUNTER — Other Ambulatory Visit: Payer: Self-pay | Admitting: Radiation Oncology

## 2012-07-08 ENCOUNTER — Ambulatory Visit: Payer: BC Managed Care – PPO

## 2012-07-08 ENCOUNTER — Other Ambulatory Visit (HOSPITAL_BASED_OUTPATIENT_CLINIC_OR_DEPARTMENT_OTHER): Payer: BC Managed Care – PPO

## 2012-07-08 ENCOUNTER — Telehealth: Payer: Self-pay | Admitting: Oncology

## 2012-07-08 VITALS — BP 115/58 | HR 63 | Temp 98.4°F | Resp 20 | Ht 71.0 in | Wt 213.3 lb

## 2012-07-08 DIAGNOSIS — C211 Malignant neoplasm of anal canal: Secondary | ICD-10-CM

## 2012-07-08 LAB — BASIC METABOLIC PANEL (CC13)
BUN: 13 mg/dL (ref 7.0–26.0)
Creatinine: 1.1 mg/dL (ref 0.7–1.3)
Glucose: 116 mg/dl — ABNORMAL HIGH (ref 70–99)
Potassium: 3.9 mEq/L (ref 3.5–5.1)

## 2012-07-08 LAB — CBC WITH DIFFERENTIAL/PLATELET
BASO%: 0 % (ref 0.0–2.0)
Basophils Absolute: 0 10*3/uL (ref 0.0–0.1)
EOS%: 23 % — ABNORMAL HIGH (ref 0.0–7.0)
HCT: 26 % — ABNORMAL LOW (ref 38.4–49.9)
HGB: 8.8 g/dL — ABNORMAL LOW (ref 13.0–17.1)
LYMPH%: 16.4 % (ref 14.0–49.0)
MCH: 31.7 pg (ref 27.2–33.4)
MCHC: 33.8 g/dL (ref 32.0–36.0)
MCV: 93.5 fL (ref 79.3–98.0)
MONO%: 36.1 % — ABNORMAL HIGH (ref 0.0–14.0)
NEUT%: 24.5 % — ABNORMAL LOW (ref 39.0–75.0)
Platelets: 43 10*3/uL — ABNORMAL LOW (ref 140–400)

## 2012-07-08 NOTE — Telephone Encounter (Signed)
appts made and printed for pt aom °

## 2012-07-08 NOTE — Progress Notes (Signed)
   [facility]    OFFICE PROGRESS NOTE   INTERVAL HISTORY:   He completed cycle 2 of 5-fluorouracil/mitomycin-C on 06/24/2012. He completed radiation on 06/28/2012.  He complains of pain related to skin breakdown at the gluteal fold and perineum. He also has skin breakdown at the head of the penis that is painful. He has difficulty starting the urinary stream and burning with urination. No diarrhea, mouth sores, or hand/foot pain. No fever or bleeding.  Objective:  Vital signs in last 24 hours:  Blood pressure 115/58, pulse 63, temperature 98.4 F (36.9 C), temperature source Oral, resp. rate 20, height 5\' 11"  (1.803 m), weight 213 lb 4.8 oz (96.752 kg).    HEENT: No thrush or ulcers Resp: Lungs clear bilaterally Cardio: Regular rate and rhythm GI: Nontender, no hepatomegaly Vascular: No leg edema  Skin: There is superficial skin breakdown at the perineum and gluteal fold. Several areas appear to be healing. There is also skin breakdown at the groin. There are areas of erythema and ulceration at the head of the penis.    Lab Results:  Lab Results  Component Value Date   WBC 0.6* 07/08/2012   HGB 8.8* 07/08/2012   HCT 26.0* 07/08/2012   MCV 93.5 07/08/2012   PLT 43* 07/08/2012   ANC 0.2    Medications: I have reviewed the patient's current medications.  Assessment/Plan: 1.Squamous cell carcinoma of the anal canal, HPV positive.  2. Staging CT/PET scan with tiny lung nodules, hypermetabolic retroperitoneal, right inguinal, and left supraclavicular nodes . FNA biopsy of a left supraclavicular lymph node on 05/03/2012 confirmed metastatic squamous cell carcinoma. -Cycle 1 of 5-fluorouracil/mitomycin-C and concurrent radiation on 05/20/2012. Cycle 2 of 5-fluorouracil/mitomycin-C on 06/24/2012 with completion of radiation on 06/28/2012. 3. Hypermetabolic activity at the right supraglottic larynx on the PET scan 04/17/2012 with associated soft tissue fullness ,  status post a negative ENT evaluation by Dr. Pollyann Kennedy.  4. History of aortic stenosis-status post aortic valve replacement and coronary artery bypass surgery in November 2012.  5. Mucositis following cycle 1 chemotherapy-improved.  6. pancytopenia secondary to chemotherapy 7. Skin breakdown at the perineum and penis secondary to chemotherapy and radiation-he will use a barrier cream.  Disposition:  Mr. Overbeck has completed the planned course of chemotherapy and radiation. He is now at day 15 following cycle 2 of 5-fluorouracil/mitomycin-C. He has developed severe pancytopenia. He will return for a CBC on 07/10/2012. Mr. Elby Showers knows to contact us for a fever or bleeding.  He will use a barrier cream at the perineum and penis. Hopefully the skin breakdown will improve over the next week. He will return for an office visit in one week.  The plan is to proceed with a restaging x-ray evaluation when he has completely recovered from the chemotherapy/radiation.     Thornton Papas, MD  07/08/2012  5:35 PM

## 2012-07-08 NOTE — Patient Instructions (Signed)
Call for fever or bleeding, hold aspirin

## 2012-07-10 ENCOUNTER — Other Ambulatory Visit (HOSPITAL_BASED_OUTPATIENT_CLINIC_OR_DEPARTMENT_OTHER): Payer: BC Managed Care – PPO

## 2012-07-10 DIAGNOSIS — C211 Malignant neoplasm of anal canal: Secondary | ICD-10-CM

## 2012-07-10 LAB — CBC WITH DIFFERENTIAL/PLATELET
BASO%: 0.4 % (ref 0.0–2.0)
LYMPH%: 7.1 % — ABNORMAL LOW (ref 14.0–49.0)
MCHC: 35.4 g/dL (ref 32.0–36.0)
MCV: 95.9 fL (ref 79.3–98.0)
MONO#: 0.3 10*3/uL (ref 0.1–0.9)
MONO%: 16.4 % — ABNORMAL HIGH (ref 0.0–14.0)
Platelets: 45 10*3/uL — ABNORMAL LOW (ref 140–400)
RBC: 2.8 10*6/uL — ABNORMAL LOW (ref 4.20–5.82)
WBC: 1.6 10*3/uL — ABNORMAL LOW (ref 4.0–10.3)

## 2012-07-11 ENCOUNTER — Telehealth: Payer: Self-pay | Admitting: *Deleted

## 2012-07-11 NOTE — Telephone Encounter (Signed)
Called in refill for Hydrocodone 5/325 mg tabs; take 1-2 po q 4-6 hrs prn pain.  Total of 60 tabs with 1 refill as ordered by Dr.John Mitzi Hansen.  Mrs Maclay notified that script has been renewed.  She reports that Mr. Mckoy still has peeling skin in treatment field and this is the cause of his discomfort, but he only takes pain medication at least once daily.

## 2012-07-15 ENCOUNTER — Telehealth: Payer: Self-pay | Admitting: Oncology

## 2012-07-15 ENCOUNTER — Ambulatory Visit (HOSPITAL_BASED_OUTPATIENT_CLINIC_OR_DEPARTMENT_OTHER): Payer: BC Managed Care – PPO | Admitting: Nurse Practitioner

## 2012-07-15 ENCOUNTER — Other Ambulatory Visit (HOSPITAL_BASED_OUTPATIENT_CLINIC_OR_DEPARTMENT_OTHER): Payer: BC Managed Care – PPO

## 2012-07-15 VITALS — BP 116/62 | HR 60 | Temp 97.8°F | Resp 20 | Ht 69.0 in | Wt 215.5 lb

## 2012-07-15 DIAGNOSIS — C211 Malignant neoplasm of anal canal: Secondary | ICD-10-CM

## 2012-07-15 DIAGNOSIS — D61818 Other pancytopenia: Secondary | ICD-10-CM

## 2012-07-15 DIAGNOSIS — C778 Secondary and unspecified malignant neoplasm of lymph nodes of multiple regions: Secondary | ICD-10-CM

## 2012-07-15 LAB — CBC WITH DIFFERENTIAL/PLATELET
BASO%: 0.4 % (ref 0.0–2.0)
MCHC: 32.3 g/dL (ref 32.0–36.0)
MONO#: 0.4 10*3/uL (ref 0.1–0.9)
RBC: 2.85 10*6/uL — ABNORMAL LOW (ref 4.20–5.82)
RDW: 15.8 % — ABNORMAL HIGH (ref 11.0–14.6)
WBC: 2.6 10*3/uL — ABNORMAL LOW (ref 4.0–10.3)
lymph#: 0.3 10*3/uL — ABNORMAL LOW (ref 0.9–3.3)

## 2012-07-15 LAB — TECHNOLOGIST REVIEW

## 2012-07-15 NOTE — Progress Notes (Signed)
  Radiation Oncology         [phone] ________________________________  Name: JEN CADOTTE MRN: [id number]  Date: 06/28/2012  DOB: [birth year]  End of Treatment Note  Diagnosis:   Anal cancer     Indication for treatment:  Palliative       Radiation treatment dates:   05/20/2012 through 06/28/2012  Site/dose:   The patient was treated to the gross disease within the anal region in addition to regional lymphatics. The patient was treated to a dose of 54 gray at 1.8 gray per fraction in 30 fractions. His treatment consisted of a IMRT technique on her chemotherapy unit with daily image guidance.  Narrative: The patient tolerated radiation treatment relatively well.   The patient really did quite well during his course of treatment. He experienced expected skin irritation which was managed with a variety of local measures. He did not exhibit major GI toxicity including nausea or diarrhea.  Plan: The patient has completed radiation treatment. The patient will return to radiation oncology clinic for routine followup in one month. I advised the patient to call or return sooner if they have any questions or concerns related to their recovery or treatment. ________________________________  Radene Gunning, M.D., Ph.D.

## 2012-07-15 NOTE — Telephone Encounter (Signed)
gv and printed appt schedule for pt for Dec...the patient awere that central scheduling will call him to make PET scan appt.

## 2012-07-15 NOTE — Progress Notes (Signed)
OFFICE PROGRESS NOTE  Interval history:  Mr. Soules returns as scheduled. He is feeling better. He reports the skin has healed at the gluteal fold and perineum as well as the head of the penis. He no longer has pain. He denies mouth sores. No nausea or vomiting. No diarrhea. No rectal bleeding.   Objective: Blood pressure 116/62, pulse 60, temperature 97.8 F (36.6 C), temperature source Oral, resp. rate 20, height 5\' 9"  (1.753 m), weight 215 lb 8 oz (97.75 kg).  Oropharynx is without thrush or ulceration. No palpable cervical or supraclavicular lymph nodes. Lungs are clear. Regular cardiac rhythm. Abdomen is soft and nontender. No hepatomegaly. Extremities are without edema. The skin is healing at the perineum and gluteal cleft. There are no longer areas of erythema and ulceration at the head of the penis.  Lab Results: Lab Results  Component Value Date   WBC 2.6* 07/15/2012   HGB 9.0* 07/15/2012   HCT 27.9* 07/15/2012   MCV 97.9 07/15/2012   PLT 126* 07/15/2012    Chemistry:    Chemistry      Component Value Date/Time   NA 134* 07/08/2012 0755   NA 139 04/10/2012 1204   K 3.9 07/08/2012 0755   K 5.4* 04/10/2012 1204   CL 100 07/08/2012 0755   CL 103 04/10/2012 1204   CO2 28 07/08/2012 0755   CO2 29 04/10/2012 1204   BUN 13.0 07/08/2012 0755   BUN 12 04/10/2012 1204   CREATININE 1.1 07/08/2012 0755   CREATININE 1.2 04/10/2012 1204      Component Value Date/Time   CALCIUM 8.5 07/08/2012 0755   CALCIUM 10.3 04/10/2012 1204   ALKPHOS 60 06/24/2012 0933   ALKPHOS 53 04/10/2012 1204   AST 17 06/24/2012 0933   AST 29 04/10/2012 1204   ALT 21 06/24/2012 0933   ALT 29 04/10/2012 1204   BILITOT 0.30 06/24/2012 0933   BILITOT 0.8 04/10/2012 1204       Studies/Results: No results found.  Medications: I have reviewed the patient's current medications.  Assessment/Plan:  1.Squamous cell carcinoma of the anal canal, HPV positive.  2. Staging CT/PET scan with tiny lung nodules, hypermetabolic  retroperitoneal, right inguinal, and left supraclavicular nodes . FNA biopsy of a left supraclavicular lymph node on 05/03/2012 confirmed metastatic squamous cell carcinoma. -Cycle 1 of 5-fluorouracil/mitomycin-C and concurrent radiation on 05/20/2012. Cycle 2 of 5-fluorouracil/mitomycin-C on 06/24/2012 with completion of radiation on 06/28/2012.  3. Hypermetabolic activity at the right supraglottic larynx on the PET scan 04/17/2012 with associated soft tissue fullness status post a negative ENT evaluation by Dr. Pollyann Kennedy.  4. History of aortic stenosis-status post aortic valve replacement and coronary artery bypass surgery in November 2012.  5. Mucositis following cycle 1 chemotherapy. Resolved. 6. Pancytopenia secondary to chemotherapy. Counts are recovering.  7. Skin breakdown at the perineum and penis secondary to chemotherapy and radiation. He is no longer having pain. The skin is healing.  Disposition-Mr. Ta appears stable. He has completed the planned course of chemotherapy and radiation. He is currently at day 22 following cycle 2 of 5-fluorouracil/mitomycin-C. Blood counts are recovering and the skin breakdown is healing. Dr. Truett Perna recommends a referral for a restaging PET scan in approximately 3 weeks. Mr. Bingaman will return for a followup visit on 08/09/2012 to review the results. He will contact the office in the interim with any problems.  Plan reviewed with Dr. Truett Perna.   Lonna Cobb ANP/GNP-BC

## 2012-07-24 ENCOUNTER — Encounter: Payer: Self-pay | Admitting: Radiation Oncology

## 2012-07-25 ENCOUNTER — Ambulatory Visit
Admission: RE | Admit: 2012-07-25 | Discharge: 2012-07-25 | Disposition: A | Discharge status: Home / Self Care | Payer: BC Managed Care – PPO | Source: Ambulatory Visit | Attending: Radiation Oncology | Admitting: Radiation Oncology

## 2012-07-25 ENCOUNTER — Encounter: Payer: Self-pay | Admitting: Radiation Oncology

## 2012-07-25 VITALS — BP 121/57 | HR 98 | Temp 97.6°F | Wt 214.9 lb

## 2012-07-25 DIAGNOSIS — C211 Malignant neoplasm of anal canal: Secondary | ICD-10-CM

## 2012-07-25 NOTE — Progress Notes (Signed)
Kirk [name] here for 1 month FU following radiation for anal cancer.  He denies any pain presently but if he has to strain to defecate his rectal area gets "sore".  No other voiced complaints.

## 2012-07-25 NOTE — Progress Notes (Signed)
Radiation Oncology         [phone] ________________________________  Name: Kirk [name] MRN: [id number]  Date: 07/25/2012  DOB: August 25, 1946  Follow-Up Visit Note  CC: [name], MD  [name], MD  Diagnosis:   Anal cancer  Interval Since Last Radiation:  One month   Narrative:  The patient returns today for routine follow-up.  The patient completed his course of radiotherapy on 06/28/2012. He states that he is doing well currently. He did have some ongoing skin irritation after treatment but he states that this probably is improved. He feels good about his bowel movements at this point. No blood per Doughman no ongoing diarrhea.                              ALLERGIES:  is allergic to cat hair extract and glucophage.  Meds: Current Outpatient Prescriptions  Medication Sig Dispense Refill  . Alum & Mag Hydroxide-Simeth (MAGIC MOUTHWASH) SOLN Swish and spit 5-14ml every 4 hours as needed  250 mL  1  . amitriptyline (ELAVIL) 25 MG tablet Take 25 mg by mouth at bedtime.        Marland Kitchen aspirin EC 81 MG tablet Take 81 mg by mouth daily.      Marland Kitchen atorvastatin (LIPITOR) 20 MG tablet Take 80 mg by mouth daily.       Marland Kitchen azelastine (ASTELIN) 137 MCG/SPRAY nasal spray Place 1 spray into the nose 2 (two) times daily.       . Bisacodyl (DULCOLAX PO) Take 1 tablet by mouth daily.      . diphenoxylate-atropine (LOMOTIL) 2.5-0.025 MG per tablet Take 2 tablets by mouth 4 (four) times daily as needed for diarrhea or loose stools (max 8 tablets/day).  60 tablet  0  . Docusate Sodium (COLACE PO) Take 1 tablet by mouth daily.      Marland Kitchen HYDROcodone-acetaminophen (NORCO/VICODIN) 5-325 MG per tablet TAKE 1 OR 2 TABLETS BY MOUTH EVERY 4 TO 6 HOURS AS NEEDED FOR PAIN  60 tablet  0  . lisinopril-hydrochlorothiazide (PRINZIDE,ZESTORETIC) 10-12.5 MG per tablet Take 1 tablet by mouth daily.      Marland Kitchen loperamide (IMODIUM) 2 MG capsule Take 2 mg by mouth 4 (four) times daily as needed.      . metoprolol  tartrate (LOPRESSOR) 25 MG tablet Take 25 mg by mouth 2 (two) times daily.      . mometasone (NASONEX) 50 MCG/ACT nasal spray Place 2 sprays into the nose daily.      . Multiple Vitamin (MULTIVITAMIN) tablet Take 1 tablet by mouth daily.      . prochlorperazine (COMPAZINE) 10 MG tablet Take 1 tablet (10 mg total) by mouth every 6 (six) hours as needed.  30 tablet  2  . PROCTOZONE-HC 2.5 % rectal cream USE AS DIRECTED 2 TO 4 TIMES A DAY AS NEEDED  30 g  0  . saxagliptin HCl (ONGLYZA) 5 MG TABS tablet Take 5 mg by mouth daily.      . Vitamins-Lipotropics (LIPO-FLAVONOID PLUS) TABS Take 1 tablet by mouth 3 (three) times daily after meals.         No current facility-administered medications for this encounter.   Facility-Administered Medications Ordered in Other Encounters  Medication Dose Route Frequency Provider Last Rate Last Dose  . fluorouracil (ADRUCIL) 8,850 mg in sodium chloride 0.9 % 150 mL chemo infusion  1,000 mg/m2/day (Treatment Plan Actual) Intravenous 4 days Leighton Roach  Truett Perna, MD   8,850 mg at 05/20/12 1527    Physical Findings: The patient is in no acute distress. Patient is alert and oriented.  weight is 214 lb 14.4 oz (97.478 kg). His temperature is 97.6 F (36.4 C). His blood pressure is 121/57 and his pulse is 98. .   The patient's skin shows some hyperpigmentation but overall has healed well in the anal region.  Lab Findings: Lab Results  Component Value Date   WBC 2.6* 07/15/2012   HGB 9.0* 07/15/2012   HCT 27.9* 07/15/2012   MCV 97.9 07/15/2012   PLT 126* 07/15/2012     Radiographic Findings: No results found.  Impression:    The patient is recovering well from his treatment with his skin and bowel movements acceptable at this point. He is due for a PET scan next week with followup with medical oncology.  Plan:  He will return to clinic to see me in 3 months for ongoing followup.   Radene Gunning, M.D., Ph.D.

## 2012-08-02 ENCOUNTER — Encounter (HOSPITAL_COMMUNITY): Payer: Self-pay

## 2012-08-02 ENCOUNTER — Encounter (HOSPITAL_COMMUNITY)
Admission: RE | Admit: 2012-08-02 | Discharge: 2012-08-02 | Disposition: A | Discharge status: Home / Self Care | Payer: BC Managed Care – PPO | Source: Ambulatory Visit | Attending: Nurse Practitioner | Admitting: Nurse Practitioner

## 2012-08-02 DIAGNOSIS — C211 Malignant neoplasm of anal canal: Secondary | ICD-10-CM

## 2012-08-02 DIAGNOSIS — C778 Secondary and unspecified malignant neoplasm of lymph nodes of multiple regions: Secondary | ICD-10-CM | POA: Insufficient documentation

## 2012-08-02 MED ORDER — FLUDEOXYGLUCOSE F - 18 (FDG) INJECTION
19.4000 | Freq: Once | INTRAVENOUS | Status: AC | PRN
  Administered 2012-08-02: 19.4 via INTRAVENOUS

## 2012-08-12 ENCOUNTER — Other Ambulatory Visit (HOSPITAL_BASED_OUTPATIENT_CLINIC_OR_DEPARTMENT_OTHER): Payer: BC Managed Care – PPO

## 2012-08-12 ENCOUNTER — Telehealth: Payer: Self-pay | Admitting: Oncology

## 2012-08-12 ENCOUNTER — Ambulatory Visit (HOSPITAL_BASED_OUTPATIENT_CLINIC_OR_DEPARTMENT_OTHER): Payer: BC Managed Care – PPO | Admitting: Oncology

## 2012-08-12 VITALS — BP 116/70 | HR 69 | Temp 97.9°F | Resp 18 | Ht 69.0 in | Wt 215.3 lb

## 2012-08-12 DIAGNOSIS — C211 Malignant neoplasm of anal canal: Secondary | ICD-10-CM

## 2012-08-12 DIAGNOSIS — C778 Secondary and unspecified malignant neoplasm of lymph nodes of multiple regions: Secondary | ICD-10-CM

## 2012-08-12 LAB — CBC WITH DIFFERENTIAL/PLATELET
BASO%: 0.7 % (ref 0.0–2.0)
EOS%: 19.6 % — ABNORMAL HIGH (ref 0.0–7.0)
MCH: 34 pg — ABNORMAL HIGH (ref 27.2–33.4)
MCV: 100.1 fL — ABNORMAL HIGH (ref 79.3–98.0)
MONO%: 10.8 % (ref 0.0–14.0)
RBC: 3.52 10*6/uL — ABNORMAL LOW (ref 4.20–5.82)
RDW: 18.6 % — ABNORMAL HIGH (ref 11.0–14.6)

## 2012-08-12 NOTE — Progress Notes (Signed)
[facility]    OFFICE PROGRESS NOTE   INTERVAL HISTORY:   He returns as scheduled. He feels well and has returned to work. The skin breakdown at the penis and perineum has resolved. He has occasional "diarrhea ". Good appetite.  Objective:  Vital signs in last 24 hours:  Blood pressure 116/70, pulse 69, temperature 97.9 F (36.6 C), temperature source Oral, resp. rate 18, height 5\' 9"  (1.753 m), weight 215 lb 4.8 oz (97.659 kg).    HEENT: No thrush or ulcers Lymphatics: No cervical, supraclavicular, axillary, or inguinal nodes Resp: Lungs clear bilaterally  Cardio: Regular rate and rhythm GI: No hepatosplenomegaly Vascular: No leg edema  Skin: No skin breakdown at the penis, groin, or perineum     Lab Results:  Lab Results  Component Value Date   WBC 11.4* 08/12/2012   HGB 12.0* 08/12/2012   HCT 35.2* 08/12/2012   MCV 100.1* 08/12/2012   PLT 202 08/12/2012   ANC 7.4  X-rays: Restaging PET scan on 08/02/2012-the left supraclavicular lymph node has increased metabolic activity and has enlarged. No other abnormal hypermetabolic activity in the neck. The previously noted asymmetric activity in the glottic region is less evident. No abnormal hypermetabolic activity in the chest. Multiple hypermetabolic retroperitoneal lymph nodes. No hypermetabolic pelvic or inguinal nodes. The previously noted right inguinal node is no longer measurable. No residual hypermetabolic activity at the anorectal primary.  Medications: I have reviewed the patient's current medications.  Assessment/Plan: 1.Squamous cell carcinoma of the anal canal, HPV positive.  2. Staging CT/PET scan with tiny lung nodules, hypermetabolic retroperitoneal, right inguinal, and left supraclavicular nodes . FNA biopsy of a left supraclavicular lymph node on 05/03/2012 confirmed metastatic squamous cell carcinoma. -Cycle 1 of 5-fluorouracil/mitomycin-C and concurrent radiation on 05/20/2012. Cycle 2 of  5-fluorouracil/mitomycin-C on 06/24/2012 with completion of radiation on 06/28/2012.              -Restaging PET scan 08/02/2012 with resolution of the hypermetabolic anal primary and right inguinal lymph node. Persistent hypermetabolic supraclavicular and retroperitoneal nodes. 3. Hypermetabolic activity at the right supraglottic larynx on the PET scan 04/17/2012 with associated soft tissue fullness status post a negative ENT evaluation by Dr. Pollyann Kennedy.  4. History of aortic stenosis-status post aortic valve replacement and coronary artery bypass surgery in November 2012.  5. Mucositis following cycle 1 chemotherapy. Resolved.  6. Pancytopenia secondary to chemotherapy. Counts are recovering.  7. Skin breakdown at the perineum and penis secondary to chemotherapy and radiation. Resolved.  Disposition:  He completed concurrent chemotherapy radiation for treatment of anal cancer. A restaging PET scan revealed a good response at the anal primary and right inguinal lymph node, but there is persistent hypermetabolic activity associated with enlarged supraclavicular and retroperitoneal nodes. These lymph nodes were not in the radiation field.  His case was presented at the GI tumor conference last week. We discussed the indication for additional systemic chemotherapy and radiation. We discussed treating the metastatic lymphadenopathy with radiation. Chemotherapy is not recommended since this will not be curative, the metastatic disease did not appear to respond to the initial chemotherapy, and he is asymptomatic. Dr. Mitzi Hansen recommended a three-month interval restaging CT evaluation as opposed to further radiation.  I discussed the restaging PET scan and treatment options with Mr. Novelo and his wife. He would like to seek another opinion. He will be referred to medical oncology at [facility] and he may also see radiation oncology there.  He will return for an office  visit in approximately one month. We will see him  sooner pending the recommendations by the [facility] physicians. Approximately 30 minutes were spent with patient today. The majority of time was spent in counseling/coordination of care.   Thornton Papas, MD  08/12/2012  6:30 PM

## 2012-08-12 NOTE — Telephone Encounter (Signed)
appt made and printed for pt Pt aware he will get a call/letter with scan appt and contrast was given Aware that HIM will make ref. And will call him

## 2012-08-13 ENCOUNTER — Telehealth: Payer: Self-pay | Admitting: Oncology

## 2012-08-13 NOTE — Telephone Encounter (Signed)
Pt appt.with Dr. Maryruth Hancock @ [facility]-08/21/12@3 :00. Faxed medical records.pt will hand carry slides to appt.

## 2012-08-19 ENCOUNTER — Other Ambulatory Visit: Payer: BC Managed Care – PPO | Admitting: Lab

## 2012-08-19 ENCOUNTER — Telehealth: Payer: Self-pay | Admitting: *Deleted

## 2012-08-19 DIAGNOSIS — R197 Diarrhea, unspecified: Secondary | ICD-10-CM

## 2012-08-19 NOTE — Telephone Encounter (Signed)
Per Dr. Truett Perna: Need to check stool for C. Difficile toxin. Wife notified and will pick up collection container to take home to patient.

## 2012-08-19 NOTE — Telephone Encounter (Signed)
Wife reports his diarrhea has worsened over the weekend with several liquid stools despite using Imodium. Also reporting abdominal cramping. No fever.

## 2012-08-20 ENCOUNTER — Telehealth: Payer: Self-pay | Admitting: *Deleted

## 2012-08-20 LAB — CLOSTRIDIUM DIFFICILE BY PCR: Toxigenic C. Difficile by PCR: NEGATIVE

## 2012-08-20 NOTE — Telephone Encounter (Signed)
Wife reports patient brought in stool sample today. He is now telling her that he is having burning with urination. C. Diff was negative.

## 2012-08-21 ENCOUNTER — Telehealth: Payer: Self-pay | Admitting: *Deleted

## 2012-08-21 ENCOUNTER — Other Ambulatory Visit: Payer: Self-pay | Admitting: Radiation Oncology

## 2012-08-21 DIAGNOSIS — R300 Dysuria: Secondary | ICD-10-CM

## 2012-08-21 NOTE — Telephone Encounter (Signed)
Returned call to wife that per Dr. 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 Suggested she call Dr. Joellen Jersey office to discuss. Made her aware that C. Diff was negative. Told her to call back if he is still needing to take more than 4-5 Imodium/day in next weeks or two for loose stools and we will re evaluate. Wife reports the diarrhea (loose stool) comes and goes.

## 2012-08-21 NOTE — Telephone Encounter (Signed)
Return call for  Kirk [name].  On yesterday Kirk [name] reported to Kirk [name] that he has burning upon urination.  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  Order for U/A and C&S placed by Kirk [name] and Kirk [name] was informed of future labs , but she stated Kirk [name] is unable to come in today, so he is scheduled to come in at 0830 on tomorrow,08/22/12, to the [facility] lab.  The order placed by Kirk [name] and appointment for lab made by Kirk [name],  Secretary

## 2012-08-22 ENCOUNTER — Telehealth: Payer: Self-pay | Admitting: *Deleted

## 2012-08-22 ENCOUNTER — Ambulatory Visit
Admission: RE | Admit: 2012-08-22 | Discharge: 2012-08-22 | Disposition: A | Discharge status: Home / Self Care | Payer: BC Managed Care – PPO | Source: Ambulatory Visit | Attending: Radiation Oncology | Admitting: Radiation Oncology

## 2012-08-22 ENCOUNTER — Other Ambulatory Visit: Payer: Self-pay | Admitting: Radiation Oncology

## 2012-08-22 DIAGNOSIS — R300 Dysuria: Secondary | ICD-10-CM

## 2012-08-22 DIAGNOSIS — C210 Malignant neoplasm of anus, unspecified: Secondary | ICD-10-CM | POA: Insufficient documentation

## 2012-08-22 LAB — URINALYSIS, MICROSCOPIC - CHCC
Blood: NEGATIVE
Glucose: NEGATIVE g/dL
Leukocyte Esterase: NEGATIVE
Nitrite: NEGATIVE
RBC / HPF: NEGATIVE (ref 0–2)
WBC, UA: NEGATIVE (ref 0–2)

## 2012-08-22 MED ORDER — PHENAZOPYRIDINE HCL 200 MG PO TABS: 200.0000 mg | ORAL_TABLET | Freq: Three times a day (TID) | ORAL | Status: DC | PRN

## 2012-08-22 NOTE — Telephone Encounter (Signed)
Called and spoke with pt's wife; she verbalized understanding of appt 08/23/12 at 10am.  POF completed.

## 2012-08-22 NOTE — Telephone Encounter (Signed)
Received message from pt's wife to call regarding results of [facility] appt.  Called and spoke with pt's wife; reported that oncology MD at [facility] (she thought it was Dr. Dorothey Baseman) suggested to start "another round of chemo right away but will be different chemo than before."  Pt's wife stated "we would come start it today if we could,"  Very anxious; wants Dr. Truett Perna to "get the ball rolling ASAP."  Note to Dr. Truett Perna.

## 2012-08-23 ENCOUNTER — Other Ambulatory Visit: Payer: Self-pay | Admitting: *Deleted

## 2012-08-23 ENCOUNTER — Telehealth: Payer: Self-pay | Admitting: *Deleted

## 2012-08-23 ENCOUNTER — Telehealth: Payer: Self-pay | Admitting: Oncology

## 2012-08-23 ENCOUNTER — Ambulatory Visit (HOSPITAL_BASED_OUTPATIENT_CLINIC_OR_DEPARTMENT_OTHER): Payer: BC Managed Care – PPO | Admitting: Oncology

## 2012-08-23 VITALS — BP 122/75 | HR 76 | Temp 97.3°F | Resp 20 | Ht 69.0 in | Wt 221.2 lb

## 2012-08-23 DIAGNOSIS — B977 Papillomavirus as the cause of diseases classified elsewhere: Secondary | ICD-10-CM

## 2012-08-23 DIAGNOSIS — C211 Malignant neoplasm of anal canal: Secondary | ICD-10-CM

## 2012-08-23 DIAGNOSIS — R300 Dysuria: Secondary | ICD-10-CM

## 2012-08-23 DIAGNOSIS — C778 Secondary and unspecified malignant neoplasm of lymph nodes of multiple regions: Secondary | ICD-10-CM

## 2012-08-23 LAB — URINE CULTURE

## 2012-08-23 NOTE — Telephone Encounter (Signed)
appts made and printed for pt aom °

## 2012-08-23 NOTE — Telephone Encounter (Signed)
Per staff message and POF I have schedueld appts.  JMW  

## 2012-08-23 NOTE — Telephone Encounter (Signed)
appts made and printed for  Pt aom °

## 2012-08-23 NOTE — Progress Notes (Signed)
   [facility]    OFFICE PROGRESS NOTE   INTERVAL HISTORY:  He saw Dr. Maryruth Hancock at [facility] earlier this week. He was also evaluated by radiation oncology. Dr. Maryruth Hancock recommends proceeding with systemic therapy. Mr. Mathews feels well. He reports burning with urination and mild suprapubic discomfort. Dr. Mitzi Hansen prescribed Pyridium.   Objective:  Vital signs in last 24 hours:  Blood pressure 122/75, pulse 76, temperature 97.3 F (36.3 C), temperature source Oral, resp. rate 20, height 5\' 9"  (1.753 m), weight 221 lb 3.2 oz (100.336 kg).    HEENT: Neck without mass Lymphatics: No cervical, supraclavicular, or inguinal nodes Resp: Lungs clear bilaterally Cardio: Regular rate and rhythm GI: No hepatomegaly, mild tenderness in the low mid abdomen, no mass Vascular: No leg edema   Lab Results:  Lab Results  Component Value Date   WBC 11.4* 08/12/2012   HGB 12.0* 08/12/2012   HCT 35.2* 08/12/2012   MCV 100.1* 08/12/2012   PLT 202 08/12/2012   Urinalysis on 08/22/2012-nitrite negative, leukocyte esterase negative, blood negative, WBCs negative, rbc's negative   Medications: I have reviewed the patient's current medications.  Assessment/Plan: 1.Squamous cell carcinoma of the anal canal, HPV positive.  2. Staging CT/PET scan with tiny lung nodules, hypermetabolic retroperitoneal, right inguinal, and left supraclavicular nodes . FNA biopsy of a left supraclavicular lymph node on 05/03/2012 confirmed metastatic squamous cell carcinoma. -Cycle 1 of 5-fluorouracil/mitomycin-C and concurrent radiation on 05/20/2012. Cycle 2 of 5-fluorouracil/mitomycin-C on 06/24/2012 with completion of radiation on 06/28/2012. -Restaging PET scan 08/02/2012 with resolution of the hypermetabolic anal primary and right inguinal lymph node. Persistent hypermetabolic supraclavicular and retroperitoneal nodes.  3. Hypermetabolic activity at the right supraglottic larynx on the PET scan 04/17/2012  with associated soft tissue fullness status post a negative ENT evaluation by Dr. Pollyann Kennedy.  4. History of aortic stenosis-status post aortic valve replacement and coronary artery bypass surgery in November 2012.  5. Mucositis following cycle 1 chemotherapy. Resolved.  6. Skin breakdown at the perineum and penis secondary to chemotherapy and radiation. Resolved. 7. 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 He will begin a trial of pyridium.   Disposition:  I discussed the case with Dr. Maryruth Hancock. I reviewed systemic treatment options with Mr. Leino and his wife. He understands the likelihood of any therapy being curative is small. He feels comfortable proceeding with systemic chemotherapy. The plan is to proceed with Taxol/carboplatin on a 3 week schedule. He will receive Neulasta support.  He would like to begin chemotherapy after the Christmas holiday. He is scheduled for a first cycle of Taxol/carboplatin on 08/30/2012. I reviewed the potential toxicities associated with this regimen including the chance for nausea/vomiting, mucositis, diarrhea, alopecia, and hematologic toxicity. We discussed the allergic reaction associated with Taxol and carboplatin. We reviewed the neuropathy and bone pain associated with Taxol. We also discussed potential for bone pain with Neulasta.  He will return for a nadir CBC after cycle 1. Mr. Torrence is scheduled for an office visit and cycle 2 on 09/21/2011.  The plan is to obtain a restaging CT evaluation after 4 cycles of chemotherapy.   Thornton Papas, MD  08/23/2012  5:27 PM

## 2012-08-26 ENCOUNTER — Telehealth: Payer: Self-pay | Admitting: *Deleted

## 2012-08-26 DIAGNOSIS — C218 Malignant neoplasm of overlapping sites of rectum, anus and anal canal: Secondary | ICD-10-CM

## 2012-08-26 MED ORDER — DEXAMETHASONE 4 MG PO TABS: ORAL_TABLET | ORAL | Status: DC

## 2012-08-26 NOTE — Telephone Encounter (Signed)
Left VM with steroid premed directions and to come on 12/27 at 11:15 for labs prior to tx.

## 2012-08-29 ENCOUNTER — Other Ambulatory Visit: Payer: Self-pay | Admitting: Oncology

## 2012-08-29 ENCOUNTER — Telehealth: Payer: Self-pay | Admitting: *Deleted

## 2012-08-29 NOTE — Telephone Encounter (Signed)
VM from wife, Dondra Spry that patient has had URI with cough and chest congestion. No fever. Will he still have chemo tomorrow?

## 2012-08-29 NOTE — Telephone Encounter (Signed)
Called patient back. Cold symptoms are minor and he is feeling well and wants to purse chemo. OK per Dr. Truett Perna.

## 2012-08-30 ENCOUNTER — Other Ambulatory Visit: Payer: Self-pay | Admitting: Oncology

## 2012-08-30 ENCOUNTER — Other Ambulatory Visit (HOSPITAL_BASED_OUTPATIENT_CLINIC_OR_DEPARTMENT_OTHER): Payer: BC Managed Care – PPO | Admitting: Lab

## 2012-08-30 ENCOUNTER — Ambulatory Visit (HOSPITAL_BASED_OUTPATIENT_CLINIC_OR_DEPARTMENT_OTHER): Payer: BC Managed Care – PPO

## 2012-08-30 VITALS — BP 120/71 | HR 74 | Temp 96.5°F

## 2012-08-30 DIAGNOSIS — C778 Secondary and unspecified malignant neoplasm of lymph nodes of multiple regions: Secondary | ICD-10-CM

## 2012-08-30 DIAGNOSIS — C211 Malignant neoplasm of anal canal: Secondary | ICD-10-CM

## 2012-08-30 DIAGNOSIS — Z5111 Encounter for antineoplastic chemotherapy: Secondary | ICD-10-CM

## 2012-08-30 LAB — CBC WITH DIFFERENTIAL/PLATELET
Basophils Absolute: 0 10*3/uL (ref 0.0–0.1)
EOS%: 0 % (ref 0.0–7.0)
HGB: 12 g/dL — ABNORMAL LOW (ref 13.0–17.1)
MCH: 31.7 pg (ref 27.2–33.4)
MCV: 98.9 fL — ABNORMAL HIGH (ref 79.3–98.0)
MONO%: 0.6 % (ref 0.0–14.0)
RDW: 15 % — ABNORMAL HIGH (ref 11.0–14.6)

## 2012-08-30 LAB — COMPREHENSIVE METABOLIC PANEL (CC13)
Alkaline Phosphatase: 74 U/L (ref 40–150)
BUN: 21 mg/dL (ref 7.0–26.0)
Glucose: 185 mg/dl — ABNORMAL HIGH (ref 70–99)
Sodium: 137 mEq/L (ref 136–145)
Total Bilirubin: 0.37 mg/dL (ref 0.20–1.20)
Total Protein: 7.5 g/dL (ref 6.4–8.3)

## 2012-08-30 MED ORDER — ONDANSETRON 16 MG/50ML IVPB (CHCC)
16.0000 mg | Freq: Once | INTRAVENOUS | Status: AC
  Administered 2012-08-30: 16 mg via INTRAVENOUS

## 2012-08-30 MED ORDER — DIPHENHYDRAMINE HCL 50 MG/ML IJ SOLN
50.0000 mg | Freq: Once | INTRAMUSCULAR | Status: AC
  Administered 2012-08-30: 50 mg via INTRAVENOUS

## 2012-08-30 MED ORDER — DEXAMETHASONE SODIUM PHOSPHATE 4 MG/ML IJ SOLN
20.0000 mg | Freq: Once | INTRAMUSCULAR | Status: AC
  Administered 2012-08-30: 20 mg via INTRAVENOUS

## 2012-08-30 MED ORDER — SODIUM CHLORIDE 0.9 % IV SOLN
Freq: Once | INTRAVENOUS | Status: AC
  Administered 2012-08-30: 12:00:00 via INTRAVENOUS

## 2012-08-30 MED ORDER — PACLITAXEL CHEMO INJECTION 300 MG/50ML
175.0000 mg/m2 | Freq: Once | INTRAVENOUS | Status: AC
  Administered 2012-08-30: 384 mg via INTRAVENOUS
  Filled 2012-08-30: qty 64

## 2012-08-30 MED ORDER — FAMOTIDINE IN NACL 20-0.9 MG/50ML-% IV SOLN
20.0000 mg | Freq: Once | INTRAVENOUS | Status: AC
  Administered 2012-08-30: 20 mg via INTRAVENOUS

## 2012-08-30 MED ORDER — SODIUM CHLORIDE 0.9 % IV SOLN
600.0000 mg | Freq: Once | INTRAVENOUS | Status: AC
  Administered 2012-08-30: 600 mg via INTRAVENOUS
  Filled 2012-08-30: qty 60

## 2012-08-30 NOTE — Patient Instructions (Addendum)
[facility] Discharge Instructions for Patients Receiving Chemotherapy  Today you received the following chemotherapy agents: taxol, carboplatin  To help prevent nausea and vomiting after your treatment, we encourage you to take your nausea medication.  Take it as often as prescribed.    If you develop nausea and vomiting that is not controlled by your nausea medication, call the clinic. If it is after clinic hours your family physician or the after hours number for the clinic or go to the Emergency Department.   BELOW ARE SYMPTOMS THAT SHOULD BE REPORTED IMMEDIATELY:  *FEVER GREATER THAN 100.5 F  *CHILLS WITH OR WITHOUT FEVER  NAUSEA AND VOMITING THAT IS NOT CONTROLLED WITH YOUR NAUSEA MEDICATION  *UNUSUAL SHORTNESS OF BREATH  *UNUSUAL BRUISING OR BLEEDING  TENDERNESS IN MOUTH AND THROAT WITH OR WITHOUT PRESENCE OF ULCERS  *URINARY PROBLEMS  *BOWEL PROBLEMS  UNUSUAL RASH Items with * indicate a potential emergency and should be followed up as soon as possible.  One of the nurses will contact you 24 hours after your treatment. Please let the nurse know about any problems that you may have experienced. Feel free to call the clinic you have any questions or concerns. The clinic phone number is [phone].   I have been informed and understand all the instructions given to me. I know to contact the clinic, my physician, or go to the Emergency Department if any problems should occur. I do not have any questions at this time, but understand that I may call the clinic during office hours   should I have any questions or need assistance in obtaining follow up care.    __________________________________________  _____________  __________ Signature of Patient or Authorized Representative            Date                   Time    __________________________________________ Nurse's Signature   Paclitaxel injection (Taxol) What is this medicine? PACLITAXEL (PAK  li TAX el) is a chemotherapy drug. It targets fast dividing cells, like cancer cells, and causes these cells to die. This medicine is used to treat ovarian cancer, breast cancer, and other cancers. This medicine may be used for other purposes; ask your health care provider or pharmacist if you have questions. What should I tell my health care provider before I take this medicine? They need to know if you have any of these conditions: -blood disorders -irregular heartbeat -infection (especially a virus infection such as chickenpox, cold sores, or herpes) -liver disease -previous or ongoing radiation therapy -an unusual or allergic reaction to paclitaxel, alcohol, polyoxyethylated castor oil, other chemotherapy agents, other medicines, foods, dyes, or preservatives -pregnant or trying to get pregnant -breast-feeding How should I use this medicine? This drug is given as an infusion into a vein. It is administered in a hospital or clinic by a specially trained health care professional. Talk to your pediatrician regarding the use of this medicine in children. Special care may be needed. Overdosage: If you think you have taken too much of this medicine contact a poison control center or emergency room at once. NOTE: This medicine is only for you. Do not share this medicine with others. What if I miss a dose? It is important not to miss your dose. Call your doctor or health care professional if you are unable to keep an appointment. What may interact with this medicine? Do not take this medicine with any of the  following medications: -disulfiram -metronidazole This medicine may also interact with the following medications: -cyclosporine -dexamethasone -diazepam -ketoconazole -medicines to increase blood counts like filgrastim, pegfilgrastim, sargramostim -other chemotherapy drugs like cisplatin, doxorubicin, epirubicin, etoposide, teniposide,  vincristine -quinidine -testosterone -vaccines -verapamil Talk to your doctor or health care professional before taking any of these medicines: -acetaminophen -aspirin -ibuprofen -ketoprofen -naproxen This list may not describe all possible interactions. Give your health care provider a list of all the medicines, herbs, non-prescription drugs, or dietary supplements you use. Also tell them if you smoke, drink alcohol, or use illegal drugs. Some items may interact with your medicine. What should I watch for while using this medicine? Your condition will be monitored carefully while you are receiving this medicine. You will need important blood work done while you are taking this medicine. This drug may make you feel generally unwell. This is not uncommon, as chemotherapy can affect healthy cells as well as cancer cells. Report any side effects. Continue your course of treatment even though you feel ill unless your doctor tells you to stop. In some cases, you may be given additional medicines to help with side effects. Follow all directions for their use. Call your doctor or health care professional for advice if you get a fever, chills or sore throat, or other symptoms of a cold or flu. Do not treat yourself. This drug decreases your body's ability to fight infections. Try to avoid being around people who are sick. This medicine may increase your risk to bruise or bleed. Call your doctor or health care professional if you notice any unusual bleeding. Be careful brushing and flossing your teeth or using a toothpick because you may get an infection or bleed more easily. If you have any dental work done, tell your dentist you are receiving this medicine. Avoid taking products that contain aspirin, acetaminophen, ibuprofen, naproxen, or ketoprofen unless instructed by your doctor. These medicines may hide a fever. Do not become pregnant while taking this medicine. Women should inform their doctor if  they wish to become pregnant or think they might be pregnant. There is a potential for serious side effects to an unborn child. Talk to your health care professional or pharmacist for more information. Do not breast-feed an infant while taking this medicine. Men are advised not to father a child while receiving this medicine. What side effects may I notice from receiving this medicine? Side effects that you should report to your doctor or health care professional as soon as possible: -allergic reactions like skin rash, itching or hives, swelling of the face, lips, or tongue -low blood counts - This drug may decrease the number of white blood cells, red blood cells and platelets. You may be at increased risk for infections and bleeding. -signs of infection - fever or chills, cough, sore throat, pain or difficulty passing urine -signs of decreased platelets or bleeding - bruising, pinpoint red spots on the skin, black, tarry stools, nosebleeds -signs of decreased red blood cells - unusually weak or tired, fainting spells, lightheadedness -breathing problems -chest pain -high or low blood pressure -mouth sores -nausea and vomiting -pain, swelling, redness or irritation at the injection site -pain, tingling, numbness in the hands or feet -slow or irregular heartbeat -swelling of the ankle, feet, hands Side effects that usually do not require medical attention (report to your doctor or health care professional if they continue or are bothersome): -bone pain -complete hair loss including hair on your head, underarms, pubic hair, eyebrows, and  eyelashes -changes in the color of fingernails -diarrhea -loosening of the fingernails -loss of appetite -muscle or joint pain -red flush to skin -sweating This list may not describe all possible side effects. Call your doctor for medical advice about side effects. You may report side effects to FDA at 1-800-FDA-1088. Where should I keep my  medicine? This drug is given in a hospital or clinic and will not be stored at home. NOTE: This sheet is a summary. It may not cover all possible information. If you have questions about this medicine, talk to your doctor, pharmacist, or health care provider.  2012, [employer]. (08/03/2008 11:54:26 AM)  Carboplatin injection What is this medicine? CARBOPLATIN (KAR boe pla tin) is a chemotherapy drug. It targets fast dividing cells, like cancer cells, and causes these cells to die. This medicine is used to treat ovarian cancer and many other cancers. This medicine may be used for other purposes; ask your health care provider or pharmacist if you have questions. What should I tell my health care provider before I take this medicine? They need to know if you have any of these conditions: -blood disorders -hearing problems -kidney disease -recent or ongoing radiation therapy -an unusual or allergic reaction to carboplatin, cisplatin, other chemotherapy, other medicines, foods, dyes, or preservatives -pregnant or trying to get pregnant -breast-feeding How should I use this medicine? This drug is usually given as an infusion into a vein. It is administered in a hospital or clinic by a specially trained health care professional. Talk to your pediatrician regarding the use of this medicine in children. Special care may be needed. Overdosage: If you think you have taken too much of this medicine contact a poison control center or emergency room at once. NOTE: This medicine is only for you. Do not share this medicine with others. What if I miss a dose? It is important not to miss a dose. Call your doctor or health care professional if you are unable to keep an appointment. What may interact with this medicine? -medicines for seizures -medicines to increase blood counts like filgrastim, pegfilgrastim, sargramostim -some antibiotics like amikacin, gentamicin, neomycin, streptomycin,  tobramycin -vaccines Talk to your doctor or health care professional before taking any of these medicines: -acetaminophen -aspirin -ibuprofen -ketoprofen -naproxen This list may not describe all possible interactions. Give your health care provider a list of all the medicines, herbs, non-prescription drugs, or dietary supplements you use. Also tell them if you smoke, drink alcohol, or use illegal drugs. Some items may interact with your medicine. What should I watch for while using this medicine? Your condition will be monitored carefully while you are receiving this medicine. You will need important blood work done while you are taking this medicine. This drug may make you feel generally unwell. This is not uncommon, as chemotherapy can affect healthy cells as well as cancer cells. Report any side effects. Continue your course of treatment even though you feel ill unless your doctor tells you to stop. In some cases, you may be given additional medicines to help with side effects. Follow all directions for their use. Call your doctor or health care professional for advice if you get a fever, chills or sore throat, or other symptoms of a cold or flu. Do not treat yourself. This drug decreases your body's ability to fight infections. Try to avoid being around people who are sick. This medicine may increase your risk to bruise or bleed. Call your doctor or health care professional if  you notice any unusual bleeding. Be careful brushing and flossing your teeth or using a toothpick because you may get an infection or bleed more easily. If you have any dental work done, tell your dentist you are receiving this medicine. Avoid taking products that contain aspirin, acetaminophen, ibuprofen, naproxen, or ketoprofen unless instructed by your doctor. These medicines may hide a fever. Do not become pregnant while taking this medicine. Women should inform their doctor if they wish to become pregnant or think  they might be pregnant. There is a potential for serious side effects to an unborn child. Talk to your health care professional or pharmacist for more information. Do not breast-feed an infant while taking this medicine. What side effects may I notice from receiving this medicine? Side effects that you should report to your doctor or health care professional as soon as possible: -allergic reactions like skin rash, itching or hives, swelling of the face, lips, or tongue -signs of infection - fever or chills, cough, sore throat, pain or difficulty passing urine -signs of decreased platelets or bleeding - bruising, pinpoint red spots on the skin, black, tarry stools, nosebleeds -signs of decreased red blood cells - unusually weak or tired, fainting spells, lightheadedness -breathing problems -changes in hearing -changes in vision -chest pain -high blood pressure -low blood counts - This drug may decrease the number of white blood cells, red blood cells and platelets. You may be at increased risk for infections and bleeding. -nausea and vomiting -pain, swelling, redness or irritation at the injection site -pain, tingling, numbness in the hands or feet -problems with balance, talking, walking -trouble passing urine or change in the amount of urine Side effects that usually do not require medical attention (report to your doctor or health care professional if they continue or are bothersome): -hair loss -loss of appetite -metallic taste in the mouth or changes in taste This list may not describe all possible side effects. Call your doctor for medical advice about side effects. You may report side effects to FDA at 1-800-FDA-1088. Where should I keep my medicine? This drug is given in a hospital or clinic and will not be stored at home. NOTE: This sheet is a summary. It may not cover all possible information. If you have questions about this medicine, talk to your doctor, pharmacist, or health care  provider.  2012, [employer] Standard. (11/26/2007 2:38:05 PM)

## 2012-08-31 ENCOUNTER — Ambulatory Visit (HOSPITAL_BASED_OUTPATIENT_CLINIC_OR_DEPARTMENT_OTHER): Payer: BC Managed Care – PPO

## 2012-08-31 VITALS — BP 124/76 | HR 100 | Temp 98.1°F | Resp 20

## 2012-08-31 DIAGNOSIS — Z5189 Encounter for other specified aftercare: Secondary | ICD-10-CM

## 2012-08-31 DIAGNOSIS — C211 Malignant neoplasm of anal canal: Secondary | ICD-10-CM

## 2012-08-31 DIAGNOSIS — C778 Secondary and unspecified malignant neoplasm of lymph nodes of multiple regions: Secondary | ICD-10-CM

## 2012-08-31 MED ORDER — PEGFILGRASTIM INJECTION 6 MG/0.6ML
6.0000 mg | Freq: Once | SUBCUTANEOUS | Status: AC
  Administered 2012-08-31: 6 mg via SUBCUTANEOUS

## 2012-09-02 ENCOUNTER — Telehealth: Payer: Self-pay

## 2012-09-02 NOTE — Telephone Encounter (Signed)
Talked to patient's wife gave her appt for 09/11/12 lab only

## 2012-09-02 NOTE — Telephone Encounter (Signed)
Kirk [name] is doing well after his Chemotherapy on 08-30-12 except for the aches with the taxol and neulasta injection.  He hurts from head to toe. Walking is difficult but a little better today.   Today is a little better than yesterday.  He is using 1 Norco tab 5/325 mg bid.  Suggested he try 1-1/2 tabs tid or more if needed.  He has been using Tylenol 500 mg q 4-6 hours.  Discussed that he should not take more than 3000 mg of Acetaminophen in 24 hours.  He will just use the Norco as above.

## 2012-09-11 ENCOUNTER — Other Ambulatory Visit (HOSPITAL_BASED_OUTPATIENT_CLINIC_OR_DEPARTMENT_OTHER): Payer: BC Managed Care – PPO | Admitting: Lab

## 2012-09-11 DIAGNOSIS — C211 Malignant neoplasm of anal canal: Secondary | ICD-10-CM

## 2012-09-11 LAB — CBC WITH DIFFERENTIAL/PLATELET
Basophils Absolute: 0 10*3/uL (ref 0.0–0.1)
Eosinophils Absolute: 0 10*3/uL (ref 0.0–0.5)
HCT: 29.7 % — ABNORMAL LOW (ref 38.4–49.9)
HGB: 9.6 g/dL — ABNORMAL LOW (ref 13.0–17.1)
LYMPH%: 5.7 % — ABNORMAL LOW (ref 14.0–49.0)
MCV: 100.3 fL — ABNORMAL HIGH (ref 79.3–98.0)
MONO%: 9.8 % (ref 0.0–14.0)
NEUT#: 4.9 10*3/uL (ref 1.5–6.5)
NEUT%: 83.1 % — ABNORMAL HIGH (ref 39.0–75.0)
Platelets: 79 10*3/uL — ABNORMAL LOW (ref 140–400)

## 2012-09-12 ENCOUNTER — Telehealth: Payer: Self-pay | Admitting: Oncology

## 2012-09-12 ENCOUNTER — Telehealth: Payer: Self-pay | Admitting: *Deleted

## 2012-09-12 DIAGNOSIS — C218 Malignant neoplasm of overlapping sites of rectum, anus and anal canal: Secondary | ICD-10-CM

## 2012-09-12 NOTE — Telephone Encounter (Signed)
Message copied by Caleb Popp on Thu Sep 12, 2012  1:29 PM ------      Message from: Thornton Papas B      Created: Wed Sep 11, 2012  9:38 PM       Please call patient, call for bleeding, check cbc 1/10

## 2012-09-12 NOTE — Telephone Encounter (Signed)
Talked to patient's wife and gave her appt for lab tomorrow 09/14/11 [phone]

## 2012-09-13 ENCOUNTER — Telehealth: Payer: Self-pay | Admitting: *Deleted

## 2012-09-13 ENCOUNTER — Telehealth: Payer: Self-pay | Admitting: Oncology

## 2012-09-13 ENCOUNTER — Other Ambulatory Visit: Payer: BC Managed Care – PPO

## 2012-09-13 DIAGNOSIS — C218 Malignant neoplasm of overlapping sites of rectum, anus and anal canal: Secondary | ICD-10-CM

## 2012-09-13 LAB — CBC WITH DIFFERENTIAL/PLATELET
BASO%: 0.6 % (ref 0.0–2.0)
LYMPH%: 10 % — ABNORMAL LOW (ref 14.0–49.0)
MCHC: 30.6 g/dL — ABNORMAL LOW (ref 32.0–36.0)
MCV: 100.7 fL — ABNORMAL HIGH (ref 79.3–98.0)
MONO%: 9.7 % (ref 0.0–14.0)
NEUT%: 78.8 % — ABNORMAL HIGH (ref 39.0–75.0)
Platelets: 89 10*3/uL — ABNORMAL LOW (ref 140–400)
RBC: 2.99 10*6/uL — ABNORMAL LOW (ref 4.20–5.82)
nRBC: 1 % — ABNORMAL HIGH (ref 0–0)

## 2012-09-13 NOTE — Telephone Encounter (Signed)
Spoke with pt in lobby. CBC reviewed, PLT 89k today. Scheduled for Lab/MD/Chemo 09/20/12. He understands to call office before then if any issues arise.

## 2012-09-13 NOTE — Telephone Encounter (Signed)
Tanya Dr. Danielle Dess nurse came by and pt needed a lab for 1.17.14...Marland KitchenMarland KitchenDone printed pt schedule for Jan and Feb

## 2012-09-18 ENCOUNTER — Telehealth: Payer: Self-pay | Admitting: *Deleted

## 2012-09-18 NOTE — Telephone Encounter (Signed)
Wife asking if he needs to take steroid premeds at home for next treatment. Per Dr. Truett Perna, "no" only needed for his initial treatment of Taxol.

## 2012-09-19 ENCOUNTER — Other Ambulatory Visit: Payer: Self-pay | Admitting: Oncology

## 2012-09-20 ENCOUNTER — Ambulatory Visit (HOSPITAL_BASED_OUTPATIENT_CLINIC_OR_DEPARTMENT_OTHER): Payer: BC Managed Care – PPO

## 2012-09-20 ENCOUNTER — Other Ambulatory Visit (HOSPITAL_BASED_OUTPATIENT_CLINIC_OR_DEPARTMENT_OTHER): Payer: BC Managed Care – PPO

## 2012-09-20 ENCOUNTER — Telehealth: Payer: Self-pay | Admitting: *Deleted

## 2012-09-20 ENCOUNTER — Ambulatory Visit (HOSPITAL_BASED_OUTPATIENT_CLINIC_OR_DEPARTMENT_OTHER): Payer: BC Managed Care – PPO | Admitting: Oncology

## 2012-09-20 ENCOUNTER — Telehealth: Payer: Self-pay | Admitting: Oncology

## 2012-09-20 VITALS — BP 126/64 | HR 82 | Temp 97.3°F | Resp 20 | Ht 69.0 in | Wt 224.6 lb

## 2012-09-20 DIAGNOSIS — R300 Dysuria: Secondary | ICD-10-CM

## 2012-09-20 DIAGNOSIS — C211 Malignant neoplasm of anal canal: Secondary | ICD-10-CM

## 2012-09-20 DIAGNOSIS — Z5111 Encounter for antineoplastic chemotherapy: Secondary | ICD-10-CM

## 2012-09-20 DIAGNOSIS — M949 Disorder of cartilage, unspecified: Secondary | ICD-10-CM

## 2012-09-20 LAB — COMPREHENSIVE METABOLIC PANEL (CC13)
Albumin: 3.6 g/dL (ref 3.5–5.0)
CO2: 22 mEq/L (ref 22–29)
Glucose: 117 mg/dl — ABNORMAL HIGH (ref 70–99)
Potassium: 4.7 mEq/L (ref 3.5–5.1)
Sodium: 137 mEq/L (ref 136–145)
Total Protein: 6.9 g/dL (ref 6.4–8.3)

## 2012-09-20 LAB — CBC WITH DIFFERENTIAL/PLATELET
Basophils Absolute: 0 10*3/uL (ref 0.0–0.1)
HCT: 32.4 % — ABNORMAL LOW (ref 38.4–49.9)
LYMPH%: 8.9 % — ABNORMAL LOW (ref 14.0–49.0)
MCH: 30.8 pg (ref 27.2–33.4)
MCHC: 30.9 g/dL — ABNORMAL LOW (ref 32.0–36.0)
MCV: 99.7 fL — ABNORMAL HIGH (ref 79.3–98.0)
lymph#: 0.3 10*3/uL — ABNORMAL LOW (ref 0.9–3.3)

## 2012-09-20 MED ORDER — SODIUM CHLORIDE 0.9 % IV SOLN
510.0000 mg | Freq: Once | INTRAVENOUS | Status: AC
  Administered 2012-09-20: 510 mg via INTRAVENOUS
  Filled 2012-09-20: qty 51

## 2012-09-20 MED ORDER — PACLITAXEL CHEMO INJECTION 300 MG/50ML
175.0000 mg/m2 | Freq: Once | INTRAVENOUS | Status: AC
  Administered 2012-09-20: 384 mg via INTRAVENOUS
  Filled 2012-09-20: qty 64

## 2012-09-20 MED ORDER — ONDANSETRON 16 MG/50ML IVPB (CHCC)
16.0000 mg | Freq: Once | INTRAVENOUS | Status: AC
  Administered 2012-09-20: 16 mg via INTRAVENOUS

## 2012-09-20 MED ORDER — FAMOTIDINE IN NACL 20-0.9 MG/50ML-% IV SOLN
20.0000 mg | Freq: Once | INTRAVENOUS | Status: AC
Start: 2012-09-20 — End: 2012-09-20
  Administered 2012-09-20: 20 mg via INTRAVENOUS

## 2012-09-20 MED ORDER — DIPHENHYDRAMINE HCL 50 MG/ML IJ SOLN
50.0000 mg | Freq: Once | INTRAMUSCULAR | Status: AC
  Administered 2012-09-20: 50 mg via INTRAVENOUS

## 2012-09-20 MED ORDER — SODIUM CHLORIDE 0.9 % IV SOLN
Freq: Once | INTRAVENOUS | Status: AC
  Administered 2012-09-20: 09:00:00 via INTRAVENOUS

## 2012-09-20 MED ORDER — DEXAMETHASONE SODIUM PHOSPHATE 4 MG/ML IJ SOLN
20.0000 mg | Freq: Once | INTRAMUSCULAR | Status: AC
  Administered 2012-09-20: 20 mg via INTRAVENOUS

## 2012-09-20 NOTE — Telephone Encounter (Signed)
appts made and printed for pt,pt aware that chemo will be added and email sent to mw to add      Kirk [name]

## 2012-09-20 NOTE — Progress Notes (Signed)
   [facility]    OFFICE PROGRESS NOTE   INTERVAL HISTORY:   He returns as scheduled. He completed a first cycle of Taxol/carboplatin chemotherapy on 08/30/2012. No nausea, mouth sores, or neuropathy symptoms following chemotherapy. He received Neulasta on the day following chemotherapy. He developed significant diffuse bone pain lasting 3-4 days. The pain was relieved with hydrocodone.  No difficulty with bowel function.  Objective:  Vital signs in last 24 hours:  Blood pressure 126/64, pulse 82, temperature 97.3 F (36.3 C), temperature source Oral, resp. rate 20, height 5\' 9"  (1.753 m), weight 224 lb 9.6 oz (101.878 kg).    HEENT: No thrush or ulcers Resp: Lungs clear bilaterally Cardio: Regular rate and rhythm, 2/6 systolic murmur GI: Nontender, no hepatosplenomegaly Vascular: No leg edema    Lab Results:  Lab Results  Component Value Date   WBC 3.6* 09/20/2012   HGB 10.0* 09/20/2012   HCT 32.4* 09/20/2012   MCV 99.7* 09/20/2012   PLT 117* 09/20/2012   ANC 2.4    Medications: I have reviewed the patient's current medications.  Assessment/Plan: 1.Squamous cell carcinoma of the anal canal, HPV positive.  2. Staging CT/PET scan with tiny lung nodules, hypermetabolic retroperitoneal, right inguinal, and left supraclavicular nodes . FNA biopsy of a left supraclavicular lymph node on 05/03/2012 confirmed metastatic squamous cell carcinoma. -Cycle 1 of 5-fluorouracil/mitomycin-C and concurrent radiation on 05/20/2012. Cycle 2 of 5-fluorouracil/mitomycin-C on 06/24/2012 with completion of radiation on 06/28/2012. -Restaging PET scan 08/02/2012 with resolution of the hypermetabolic anal primary and right inguinal lymph node. Persistent hypermetabolic supraclavicular and retroperitoneal nodes.                 -cycle 1 of salvage Taxol/carboplatin chemotherapy on 08/30/2012 3. Hypermetabolic activity at the right supraglottic larynx on the PET scan 04/17/2012 with  associated soft tissue fullness status post a negative ENT evaluation by Dr. Pollyann Kennedy.  4. History of aortic stenosis-status post aortic valve replacement and coronary artery bypass surgery in November 2012.  5. Mucositis following cycle 1 chemotherapy. Resolved.  6. Skin breakdown at the perineum and penis secondary to chemotherapy and radiation. Resolved.  7. 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 Bone pain after Neulasta with cycle 1 Taxol/carboplatin-relieved with hydrocodone  Disposition:  He completed one cycle of Taxol/carboplatin chemotherapy. He tolerated the chemotherapy well aside from bone pain associated with Neulasta. The platelets are mildly decreased today. The carboplatin will be dose reduced with cycle 2. He will again receive Neulasta support. He will contact us if he has significant bone pain.  Mr. Abdallah will return for an office visit and cycle 3 of Taxol/carboplatin 3 weeks. The plan is to obtain a restaging x-ray evaluation after 4 cycles of chemotherapy.   Thornton Papas, MD  09/20/2012  2:07 PM

## 2012-09-20 NOTE — Telephone Encounter (Signed)
Per staff message and POF I have scheduled appts.  JMW  

## 2012-09-20 NOTE — Patient Instructions (Addendum)
[facility] Discharge Instructions for Patients Receiving Chemotherapy  Today you received the following chemotherapy agents Taxol and Carboplatin  To help prevent nausea and vomiting after your treatment, we encourage you to take your nausea medication as prescribed.   If you develop nausea and vomiting that is not controlled by your nausea medication, call the clinic. If it is after clinic hours your family physician or the after hours number for the clinic or go to the Emergency Department.   BELOW ARE SYMPTOMS THAT SHOULD BE REPORTED IMMEDIATELY:  *FEVER GREATER THAN 100.5 F  *CHILLS WITH OR WITHOUT FEVER  NAUSEA AND VOMITING THAT IS NOT CONTROLLED WITH YOUR NAUSEA MEDICATION  *UNUSUAL SHORTNESS OF BREATH  *UNUSUAL BRUISING OR BLEEDING  TENDERNESS IN MOUTH AND THROAT WITH OR WITHOUT PRESENCE OF ULCERS  *URINARY PROBLEMS  *BOWEL PROBLEMS  UNUSUAL RASH Items with * indicate a potential emergency and should be followed up as soon as possible.  Feel free to call the clinic you have any questions or concerns. The clinic phone number is [phone].   I have been informed and understand all the instructions given to me. I know to contact the clinic, my physician, or go to the Emergency Department if any problems should occur. I do not have any questions at this time, but understand that I may call the clinic during office hours   should I have any questions or need assistance in obtaining follow up care.    

## 2012-09-21 ENCOUNTER — Ambulatory Visit (HOSPITAL_BASED_OUTPATIENT_CLINIC_OR_DEPARTMENT_OTHER): Payer: BC Managed Care – PPO

## 2012-09-21 VITALS — BP 132/76 | HR 103 | Temp 98.3°F | Resp 18

## 2012-09-21 DIAGNOSIS — C211 Malignant neoplasm of anal canal: Secondary | ICD-10-CM

## 2012-09-21 DIAGNOSIS — Z5189 Encounter for other specified aftercare: Secondary | ICD-10-CM

## 2012-09-21 MED ORDER — PEGFILGRASTIM INJECTION 6 MG/0.6ML
6.0000 mg | Freq: Once | SUBCUTANEOUS | Status: AC
  Administered 2012-09-21: 6 mg via SUBCUTANEOUS

## 2012-09-21 NOTE — Patient Instructions (Signed)

## 2012-09-23 ENCOUNTER — Ambulatory Visit: Payer: BC Managed Care – PPO | Admitting: Oncology

## 2012-09-23 ENCOUNTER — Telehealth: Payer: Self-pay | Admitting: *Deleted

## 2012-09-23 MED ORDER — OXYCODONE-ACETAMINOPHEN 5-325 MG PO TABS: 1.0000 | ORAL_TABLET | ORAL | Status: DC | PRN

## 2012-09-23 NOTE — Telephone Encounter (Signed)
Message from pt's wife requesting stronger pain medication. Pt's bones are aching. Attempted to reach wife on cell, no answer. Spoke with pt at home number. He is taking Hydrocodone 5/325 mg 1.5 tabs every 8 hours as needed for pain. Reviewed with Dr. Truett Perna: pt may take 2 tablets every 6 hours PRN pain or pick up rx for stronger pain med. Pt would like to discuss this with wife first. He will have her call office. Mrs. Sill returned call, pt has taken #2 Hydrocodone in the past with little relief. She will come pick up rx.

## 2012-09-24 ENCOUNTER — Other Ambulatory Visit: Payer: Self-pay

## 2012-09-24 ENCOUNTER — Emergency Department (HOSPITAL_COMMUNITY): Payer: BC Managed Care – PPO

## 2012-09-24 ENCOUNTER — Emergency Department (HOSPITAL_COMMUNITY)
Admission: EM | Admit: 2012-09-24 | Discharge: 2012-09-24 | Disposition: A | Discharge status: Home / Self Care | Payer: BC Managed Care – PPO | Attending: Emergency Medicine | Admitting: Emergency Medicine

## 2012-09-24 ENCOUNTER — Encounter (HOSPITAL_COMMUNITY): Payer: Self-pay | Admitting: Emergency Medicine

## 2012-09-24 DIAGNOSIS — I2510 Atherosclerotic heart disease of native coronary artery without angina pectoris: Secondary | ICD-10-CM | POA: Insufficient documentation

## 2012-09-24 DIAGNOSIS — Z87448 Personal history of other diseases of urinary system: Secondary | ICD-10-CM | POA: Insufficient documentation

## 2012-09-24 DIAGNOSIS — Z8719 Personal history of other diseases of the digestive system: Secondary | ICD-10-CM | POA: Insufficient documentation

## 2012-09-24 DIAGNOSIS — Z923 Personal history of irradiation: Secondary | ICD-10-CM | POA: Insufficient documentation

## 2012-09-24 DIAGNOSIS — Z7982 Long term (current) use of aspirin: Secondary | ICD-10-CM | POA: Insufficient documentation

## 2012-09-24 DIAGNOSIS — C218 Malignant neoplasm of overlapping sites of rectum, anus and anal canal: Secondary | ICD-10-CM | POA: Insufficient documentation

## 2012-09-24 DIAGNOSIS — I509 Heart failure, unspecified: Secondary | ICD-10-CM | POA: Insufficient documentation

## 2012-09-24 DIAGNOSIS — G40909 Epilepsy, unspecified, not intractable, without status epilepticus: Secondary | ICD-10-CM | POA: Insufficient documentation

## 2012-09-24 DIAGNOSIS — Z8679 Personal history of other diseases of the circulatory system: Secondary | ICD-10-CM | POA: Insufficient documentation

## 2012-09-24 DIAGNOSIS — E119 Type 2 diabetes mellitus without complications: Secondary | ICD-10-CM | POA: Insufficient documentation

## 2012-09-24 DIAGNOSIS — R011 Cardiac murmur, unspecified: Secondary | ICD-10-CM | POA: Insufficient documentation

## 2012-09-24 DIAGNOSIS — Z8709 Personal history of other diseases of the respiratory system: Secondary | ICD-10-CM | POA: Insufficient documentation

## 2012-09-24 DIAGNOSIS — R569 Unspecified convulsions: Secondary | ICD-10-CM

## 2012-09-24 DIAGNOSIS — E7800 Pure hypercholesterolemia, unspecified: Secondary | ICD-10-CM | POA: Insufficient documentation

## 2012-09-24 DIAGNOSIS — Z8739 Personal history of other diseases of the musculoskeletal system and connective tissue: Secondary | ICD-10-CM | POA: Insufficient documentation

## 2012-09-24 DIAGNOSIS — Z87442 Personal history of urinary calculi: Secondary | ICD-10-CM | POA: Insufficient documentation

## 2012-09-24 DIAGNOSIS — Z79899 Other long term (current) drug therapy: Secondary | ICD-10-CM | POA: Insufficient documentation

## 2012-09-24 LAB — URINALYSIS, ROUTINE W REFLEX MICROSCOPIC
Bilirubin Urine: NEGATIVE
Hgb urine dipstick: NEGATIVE
Ketones, ur: NEGATIVE mg/dL
Protein, ur: 100 mg/dL — AB
Urobilinogen, UA: 0.2 mg/dL (ref 0.0–1.0)

## 2012-09-24 LAB — COMPREHENSIVE METABOLIC PANEL
ALT: 24 U/L (ref 0–53)
AST: 23 U/L (ref 0–37)
Albumin: 3.5 g/dL (ref 3.5–5.2)
Alkaline Phosphatase: 102 U/L (ref 39–117)
CO2: 28 mEq/L (ref 19–32)
Chloride: 94 mEq/L — ABNORMAL LOW (ref 96–112)
Potassium: 3.9 mEq/L (ref 3.5–5.1)
Total Bilirubin: 0.4 mg/dL (ref 0.3–1.2)

## 2012-09-24 LAB — CBC WITH DIFFERENTIAL/PLATELET
Basophils Relative: 0 % (ref 0–1)
Eosinophils Relative: 0 % (ref 0–5)
HCT: 29.8 % — ABNORMAL LOW (ref 39.0–52.0)
Hemoglobin: 9.6 g/dL — ABNORMAL LOW (ref 13.0–17.0)
Lymphocytes Relative: 4 % — ABNORMAL LOW (ref 12–46)
MCH: 31.1 pg (ref 26.0–34.0)
Neutro Abs: 11.9 10*3/uL — ABNORMAL HIGH (ref 1.7–7.7)
Neutrophils Relative %: 91 % — ABNORMAL HIGH (ref 43–77)
RBC: 3.09 MIL/uL — ABNORMAL LOW (ref 4.22–5.81)
WBC Morphology: INCREASED

## 2012-09-24 LAB — URINE MICROSCOPIC-ADD ON

## 2012-09-24 MED ORDER — SODIUM CHLORIDE 0.9 % IV SOLN: INTRAVENOUS | Status: DC

## 2012-09-24 MED ORDER — SODIUM CHLORIDE 0.9 % IV BOLUS (SEPSIS)
1000.0000 mL | Freq: Once | INTRAVENOUS | Status: AC
  Administered 2012-09-24: 1000 mL via INTRAVENOUS

## 2012-09-24 NOTE — ED Provider Notes (Signed)
History     CSN: [id number]  Arrival date & time 09/24/12  1622   First MD Initiated Contact with Patient 09/24/12 1649      No chief complaint on file.   (Consider location/radiation/quality/duration/timing/severity/associated sxs/prior treatment) The history is provided by the patient and a friend.   Patient here after having a witnessed seizure by his friend prior to arrival. Seizures started in his right leg and progressed to his right arm and developed whole-body shaking. seizure lasted for possibly 2 minutes. He was post ictal afterwards. He also bit his tongue and had urinary incontinence. 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 Patient does not take antiepileptic medication. He denies any recent illnesses. No severe headaches or neck pain. Denies any current shortness of breath or chest pain abdominal pain. EMS was called and he was transported here. Patient is currently receiving chemotherapy for rectal cancer Past Medical History  Diagnosis Date  . Elevated cholesterol   . ED (erectile dysfunction)   . Aortic stenosis   . Coronary artery disease     aortic stenosis, CAD  . Angina     6 wks. ago  . Heart murmur   . Shortness of breath   . Arthritis     1995- cerv. fusion- [facility]  . CHF (congestive heart failure)   . Rectal bleeding   . Blood in stool   . Thrombosed hemorrhoids   . ED (erectile dysfunction)   . Kidney stones     hx  . 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 Diabetes mellitus     Type 2  . History of radiation therapy 05/20/12-06/28/12    anal cancer=54gy total dose    Past Surgical History  Procedure Date  . Lithotripsy 2001 AND 2002    DR PETERSON  . C4-6 fusion 1995    DR STERN  . Aortic valve replacement 08/11/2011    Procedure: AORTIC VALVE REPLACEMENT (AVR);  Surgeon: Purcell Nails, MD;  Location: [facility] OR;  Service: Open Heart Surgery;  Laterality: N/A;  . Coronary artery  bypass graft 08/16/2011    Procedure: CORONARY ARTERY BYPASS GRAFTING (CABG);  Surgeon: Purcell Nails, MD;  Location: [facility] OR;  Service: Open Heart Surgery;  Laterality: N/A;  CABG times one using left internal mammary artery  . Anal fissure repair     patient unsure of date    Family History  Problem Relation Age of Onset  . Anesthesia problems Neg Hx   . Hypotension Neg Hx   . Malignant hyperthermia Neg Hx   . Pseudochol deficiency Neg Hx   . Cancer Mother     breast  . Heart attack Father     History  Substance Use Topics  . Smoking status: Never Smoker   . Smokeless tobacco: Never Used  . Alcohol Use: Yes     Comment: RARE      Review of Systems  All other systems reviewed and are negative.    Allergies  Cat hair extract and Glucophage  Home Medications   Current Outpatient Rx  Name  Route  Sig  Dispense  Refill  . ATORVASTATIN CALCIUM 20 MG PO TABS   Oral   Take 80 mg by mouth daily.          Marland Kitchen LISINOPRIL-HYDROCHLOROTHIAZIDE 10-12.5 MG PO TABS   Oral   Take 1 tablet by mouth daily.         Marland Kitchen  AMITRIPTYLINE HCL 25 MG PO TABS   Oral   Take 25 mg by mouth at bedtime.          . ASPIRIN EC 81 MG PO TBEC   Oral   Take 81 mg by mouth daily.         . ASTEPRO 0.15 % NA SOLN   Each Nare   Place 2 sprays into both nostrils at bedtime.          Marland Kitchen HYDROCODONE-ACETAMINOPHEN 5-325 MG PO TABS      TAKE 1 OR 2 TABLETS BY MOUTH EVERY 4 TO 6 HOURS AS NEEDED FOR PAIN   60 tablet   0   . LOPERAMIDE HCL 2 MG PO CAPS   Oral   Take 2 mg by mouth 4 (four) times daily as needed.         Marland Kitchen METOPROLOL TARTRATE 25 MG PO TABS   Oral   Take 25 mg by mouth 2 (two) times daily.         Marland Kitchen ONE-DAILY MULTI VITAMINS PO TABS   Oral   Take 1 tablet by mouth daily.         Marland Kitchen NASONEX 50 MCG/ACT NA SUSP   Nasal   Place 2 sprays into the nose daily.          . OXYCODONE-ACETAMINOPHEN 5-325 MG PO TABS   Oral   Take 1-2 tablets by mouth every 4 (four)  hours as needed for pain.   50 tablet   0   . PHENAZOPYRIDINE HCL 200 MG PO TABS   Oral   Take 1 tablet (200 mg total) by mouth 3 (three) times daily as needed for pain.   90 tablet   0   . PROCHLORPERAZINE MALEATE 10 MG PO TABS   Oral   Take 1 tablet (10 mg total) by mouth every 6 (six) hours as needed.   30 tablet   2   . SAXAGLIPTIN HCL 5 MG PO TABS   Oral   Take 5 mg by mouth daily.         Marland Kitchen TADALAFIL 20 MG PO TABS   Oral   Take 20 mg by mouth Daily. Take 20 mg by mouth daily as needed.         Marland Kitchen LIPO-FLAVONOID PLUS PO TABS   Oral   Take 1 tablet by mouth 3 (three) times daily after meals.             BP 91/53  Pulse 100  Temp 99 F (37.2 C) (Oral)  Resp 18  SpO2 91%  Physical Exam  Nursing note and vitals reviewed. Constitutional: He is oriented to person, place, and time. He appears well-developed and well-nourished.  Non-toxic appearance. No distress.  HENT:  Head: Normocephalic and atraumatic.       The tongue abrasion noted  Eyes: Conjunctivae normal, EOM and lids are normal. Pupils are equal, round, and reactive to light.  Neck: Normal range of motion. Neck supple. No tracheal deviation present. No mass present.  Cardiovascular: Normal rate, regular rhythm and normal heart sounds.  Exam reveals no gallop.   No murmur heard. Pulmonary/Chest: Effort normal and breath sounds normal. No stridor. No respiratory distress. He has no decreased breath sounds. He has no wheezes. He has no rhonchi. He has no rales.  Abdominal: Soft. Normal appearance and bowel sounds are normal. He exhibits no distension. There is no tenderness. There is no rebound and no CVA tenderness.  Musculoskeletal: Normal range of motion. He exhibits no edema and no tenderness.  Neurological: He is alert and oriented to person, place, and time. He has normal strength. He displays no tremor. No cranial nerve deficit or sensory deficit. He displays no seizure activity. GCS eye subscore is  4. GCS verbal subscore is 5. GCS motor subscore is 6.  Skin: Skin is warm and dry. No abrasion and no rash noted.  Psychiatric: He has a normal mood and affect. His speech is normal and behavior is normal.    ED Course  Procedures (including critical care time)   Labs Reviewed  CBC WITH DIFFERENTIAL  COMPREHENSIVE METABOLIC PANEL   No results found.   No diagnosis found.    MDM  6:25 PM  Pt rechecked and at baseline. Will ambulate patient and check blood pressure  7:24 PM Patient ambulated and in no distress at this time. He feels that his baseline. Denies any cough or dyspnea. He has no evidence of pneumonia at this time. He denies any headache or neck pain. He will followup with his neurologist.       Toy Baker, MD 09/24/12 1924

## 2012-09-24 NOTE — ED Notes (Signed)
Patient transported to X-ray 

## 2012-09-24 NOTE — ED Notes (Signed)
Patient ambulated 50 feet with no assistance and his O2  Saturation was constant at 93%. Patient's heart rate was constant at 108 bets per minute.

## 2012-09-24 NOTE — ED Notes (Signed)
Pt BIB EMS. Pt had witnessed grand mal sz lasting about 4-5. Pt reports he has had one seizure in the past, but doesn't know when. Pt was combative upon EMS arrival. Pt now calm, cooperative. Pt bit his tongue and was bladder incontinence. Pt alert to self, but still confused per EMS. Pt has hx of CA and MI.

## 2012-09-25 ENCOUNTER — Telehealth: Payer: Self-pay | Admitting: Oncology

## 2012-09-25 ENCOUNTER — Other Ambulatory Visit: Payer: Self-pay | Admitting: *Deleted

## 2012-09-25 ENCOUNTER — Telehealth: Payer: Self-pay | Admitting: *Deleted

## 2012-09-25 DIAGNOSIS — C801 Malignant (primary) neoplasm, unspecified: Secondary | ICD-10-CM

## 2012-09-25 LAB — URINE CULTURE
Colony Count: NO GROWTH
Culture: NO GROWTH

## 2012-09-25 NOTE — Telephone Encounter (Signed)
Pt's wife called stating that pt had seizure yesterday--went to [facility] ED CT of head negative.  Pain med was changed yesterday from Vicodin to Percocet due to not getting pain relief from the Vicodin. Pt's wife states "he took amitriptyline night before, then his lisinopril, metoprolol and then percocet yesterday around the clock and I think his BP might of bottomed out bringing on this seizure"  Wanted Dr. Truett Perna to be aware and "should we get a neurology referral?"  Dr. Truett Perna made aware of situation.  Called and spoke with pt's wife informing her neurology referral has been made and confirmed appt for 10/11/12 and to call if any further problems.  Pt's wife verbalized understanding and voiced he would go back to taking the Vicodin for pain relief.

## 2012-09-25 NOTE — Telephone Encounter (Signed)
Called [facility] and left message regarding appt, gave HIM referral for neuro consult

## 2012-09-25 NOTE — Telephone Encounter (Signed)
Faxed pt medical records to [facility]

## 2012-09-26 ENCOUNTER — Other Ambulatory Visit: Payer: Self-pay | Admitting: Radiation Oncology

## 2012-09-27 ENCOUNTER — Telehealth: Payer: Self-pay | Admitting: Oncology

## 2012-09-27 ENCOUNTER — Telehealth: Payer: Self-pay | Admitting: *Deleted

## 2012-09-27 NOTE — Telephone Encounter (Signed)
Received fax from [name]. Pt is scheduled to see Dr. Thurnell Lose on 09/30/12 at 0800.

## 2012-09-27 NOTE — Telephone Encounter (Signed)
Pt called and left message regarding neurology consult

## 2012-10-01 ENCOUNTER — Ambulatory Visit (HOSPITAL_BASED_OUTPATIENT_CLINIC_OR_DEPARTMENT_OTHER): Payer: BC Managed Care – PPO | Admitting: Lab

## 2012-10-01 ENCOUNTER — Telehealth: Payer: Self-pay | Admitting: Oncology

## 2012-10-01 DIAGNOSIS — C211 Malignant neoplasm of anal canal: Secondary | ICD-10-CM

## 2012-10-01 LAB — CBC WITH DIFFERENTIAL/PLATELET
Basophils Absolute: 0 10*3/uL (ref 0.0–0.1)
EOS%: 0.2 % (ref 0.0–7.0)
LYMPH%: 7.9 % — ABNORMAL LOW (ref 14.0–49.0)
MCH: 30.9 pg (ref 27.2–33.4)
MCV: 98.8 fL — ABNORMAL HIGH (ref 79.3–98.0)
MONO%: 6.7 % (ref 0.0–14.0)
Platelets: 118 10*3/uL — ABNORMAL LOW (ref 140–400)
RBC: 3.2 10*6/uL — ABNORMAL LOW (ref 4.20–5.82)
RDW: 17.9 % — ABNORMAL HIGH (ref 11.0–14.6)
nRBC: 1 % — ABNORMAL HIGH (ref 0–0)

## 2012-10-01 NOTE — Telephone Encounter (Signed)
Pt called and wants to r/s labs from tomorrow to today 10/01/12, pt will come this afternoon for lab only appt

## 2012-10-02 ENCOUNTER — Other Ambulatory Visit: Payer: BC Managed Care – PPO | Admitting: Lab

## 2012-10-02 ENCOUNTER — Ambulatory Visit: Payer: BC Managed Care – PPO | Admitting: Lab

## 2012-10-09 ENCOUNTER — Other Ambulatory Visit: Payer: Self-pay | Admitting: Oncology

## 2012-10-11 ENCOUNTER — Telehealth: Payer: Self-pay | Admitting: *Deleted

## 2012-10-11 ENCOUNTER — Ambulatory Visit (HOSPITAL_BASED_OUTPATIENT_CLINIC_OR_DEPARTMENT_OTHER): Payer: BC Managed Care – PPO

## 2012-10-11 ENCOUNTER — Telehealth: Payer: Self-pay | Admitting: Oncology

## 2012-10-11 ENCOUNTER — Other Ambulatory Visit (HOSPITAL_BASED_OUTPATIENT_CLINIC_OR_DEPARTMENT_OTHER): Payer: BC Managed Care – PPO

## 2012-10-11 ENCOUNTER — Ambulatory Visit (HOSPITAL_BASED_OUTPATIENT_CLINIC_OR_DEPARTMENT_OTHER): Payer: BC Managed Care – PPO | Admitting: Oncology

## 2012-10-11 VITALS — BP 110/58 | HR 73 | Temp 97.1°F | Resp 20 | Ht 69.0 in | Wt 223.8 lb

## 2012-10-11 DIAGNOSIS — C211 Malignant neoplasm of anal canal: Secondary | ICD-10-CM

## 2012-10-11 DIAGNOSIS — Z5111 Encounter for antineoplastic chemotherapy: Secondary | ICD-10-CM

## 2012-10-11 DIAGNOSIS — C210 Malignant neoplasm of anus, unspecified: Secondary | ICD-10-CM

## 2012-10-11 DIAGNOSIS — R300 Dysuria: Secondary | ICD-10-CM

## 2012-10-11 DIAGNOSIS — M949 Disorder of cartilage, unspecified: Secondary | ICD-10-CM

## 2012-10-11 LAB — COMPREHENSIVE METABOLIC PANEL (CC13)
ALT: 19 U/L (ref 0–55)
AST: 15 U/L (ref 5–34)
Alkaline Phosphatase: 58 U/L (ref 40–150)
BUN: 12.9 mg/dL (ref 7.0–26.0)
Chloride: 108 mEq/L — ABNORMAL HIGH (ref 98–107)
Creatinine: 1 mg/dL (ref 0.7–1.3)
Total Bilirubin: 0.32 mg/dL (ref 0.20–1.20)

## 2012-10-11 LAB — CBC WITH DIFFERENTIAL/PLATELET
BASO%: 0.6 % (ref 0.0–2.0)
EOS%: 1.9 % (ref 0.0–7.0)
LYMPH%: 13.6 % — ABNORMAL LOW (ref 14.0–49.0)
MCH: 31.2 pg (ref 27.2–33.4)
MCHC: 31.2 g/dL — ABNORMAL LOW (ref 32.0–36.0)
MONO#: 0.5 10*3/uL (ref 0.1–0.9)
MONO%: 13.9 % (ref 0.0–14.0)
NEUT%: 70 % (ref 39.0–75.0)
Platelets: 119 10*3/uL — ABNORMAL LOW (ref 140–400)
RBC: 3.33 10*6/uL — ABNORMAL LOW (ref 4.20–5.82)
WBC: 3.6 10*3/uL — ABNORMAL LOW (ref 4.0–10.3)
nRBC: 0 % (ref 0–0)

## 2012-10-11 MED ORDER — CARBOPLATIN CHEMO INJECTION 600 MG/60ML
510.0000 mg | Freq: Once | INTRAVENOUS | Status: AC
  Administered 2012-10-11: 510 mg via INTRAVENOUS
  Filled 2012-10-11: qty 51

## 2012-10-11 MED ORDER — DEXAMETHASONE SODIUM PHOSPHATE 4 MG/ML IJ SOLN
20.0000 mg | Freq: Once | INTRAMUSCULAR | Status: AC
  Administered 2012-10-11: 20 mg via INTRAVENOUS

## 2012-10-11 MED ORDER — SODIUM CHLORIDE 0.9 % IV SOLN
Freq: Once | INTRAVENOUS | Status: AC
  Administered 2012-10-11: 10:00:00 via INTRAVENOUS

## 2012-10-11 MED ORDER — ONDANSETRON 16 MG/50ML IVPB (CHCC)
16.0000 mg | Freq: Once | INTRAVENOUS | Status: AC
  Administered 2012-10-11: 16 mg via INTRAVENOUS

## 2012-10-11 MED ORDER — FAMOTIDINE IN NACL 20-0.9 MG/50ML-% IV SOLN
20.0000 mg | Freq: Once | INTRAVENOUS | Status: AC
  Administered 2012-10-11: 20 mg via INTRAVENOUS

## 2012-10-11 MED ORDER — DIPHENHYDRAMINE HCL 50 MG/ML IJ SOLN
50.0000 mg | Freq: Once | INTRAMUSCULAR | Status: AC
  Administered 2012-10-11: 50 mg via INTRAVENOUS

## 2012-10-11 MED ORDER — HYDROCODONE-ACETAMINOPHEN 5-325 MG PO TABS: 1.0000 | ORAL_TABLET | Freq: Four times a day (QID) | ORAL | Status: DC | PRN

## 2012-10-11 MED ORDER — PACLITAXEL CHEMO INJECTION 300 MG/50ML
175.0000 mg/m2 | Freq: Once | INTRAVENOUS | Status: AC
  Administered 2012-10-11: 384 mg via INTRAVENOUS
  Filled 2012-10-11: qty 64

## 2012-10-11 NOTE — Patient Instructions (Addendum)
[facility] Discharge Instructions for Patients Receiving Chemotherapy  Today you received the following chemotherapy agents Taxol/Carboplatin  To help prevent nausea and vomiting after your treatment, we encourage you to take your nausea medication as prescribed.   If you develop nausea and vomiting that is not controlled by your nausea medication, call the clinic. If it is after clinic hours your family physician or the after hours number for the clinic or go to the Emergency Department.   BELOW ARE SYMPTOMS THAT SHOULD BE REPORTED IMMEDIATELY:  *FEVER GREATER THAN 100.5 F  *CHILLS WITH OR WITHOUT FEVER  NAUSEA AND VOMITING THAT IS NOT CONTROLLED WITH YOUR NAUSEA MEDICATION  *UNUSUAL SHORTNESS OF BREATH  *UNUSUAL BRUISING OR BLEEDING  TENDERNESS IN MOUTH AND THROAT WITH OR WITHOUT PRESENCE OF ULCERS  *URINARY PROBLEMS  *BOWEL PROBLEMS  UNUSUAL RASH Items with * indicate a potential emergency and should be followed up as soon as possible.  Feel free to call the clinic you have any questions or concerns. The clinic phone number is [phone].   I have been informed and understand all the instructions given to me. I know to contact the clinic, my physician, or go to the Emergency Department if any problems should occur. I do not have any questions at this time, but understand that I may call the clinic during office hours   should I have any questions or need assistance in obtaining follow up care.    __________________________________________  _____________  __________ Signature of Patient or Authorized Representative            Date                   Time    __________________________________________ Nurse's Signature    

## 2012-10-11 NOTE — Progress Notes (Signed)
[facility]    OFFICE PROGRESS NOTE   INTERVAL HISTORY:   He returns as scheduled. He completed a second cycle of Taxol/carbo plan on 09/20/2012. He received Neulasta.  He reports diffuse bone pain beginning approximately on day 3 following chemotherapy. This lasted for 2 days. Hydrocodone did not relieve the pain and he was prescribed oxycodone. On 09/24/2012 he had a witnessed generalized seizure and was seen in the emergency room. A CT of the brain was negative. No recurrent seizures. He was referred to neurology and underwent an MRI of the brain (he has not heard the result) and was prescribed seizure medication. He is not taking the seizure medication. No neuropathy symptoms  Mr. Routt feels well at present. He is working daily. He believes the seizure may have been caused by taking oxycodone for bone pain.  Objective:  Vital signs in last 24 hours:  Blood pressure 110/58, pulse 73, temperature 97.1 F (36.2 C), temperature source Oral, resp. rate 20, height 5\' 9"  (1.753 m), weight 223 lb 12.8 oz (101.515 kg).    HEENT: No thrush or ulcers Resp: Lungs clear bilaterally Cardio: Regular rate and rhythm GI: No hepatomegaly, nontender Vascular: No leg edema Neuro: Alert and oriented, the motor exam appears grossly intact in the upper and lower extremities      Lab Results:  Lab Results  Component Value Date   WBC 3.6* 10/11/2012   HGB 10.4* 10/11/2012   HCT 33.3* 10/11/2012   MCV 100.0* 10/11/2012   PLT 119* 10/11/2012   ANC 2.5    Medications: I have reviewed the patient's current medications.  Assessment/Plan: 1.Squamous cell carcinoma of the anal canal, HPV positive.  2. Staging CT/PET scan with tiny lung nodules, hypermetabolic retroperitoneal, right inguinal, and left supraclavicular nodes . FNA biopsy of a left supraclavicular lymph node on 05/03/2012 confirmed metastatic squamous cell carcinoma. -Cycle 1 of 5-fluorouracil/mitomycin-C and concurrent  radiation on 05/20/2012. Cycle 2 of 5-fluorouracil/mitomycin-C on 06/24/2012 with completion of radiation on 06/28/2012. -Restaging PET scan 08/02/2012 with resolution of the hypermetabolic anal primary and right inguinal lymph node. Persistent hypermetabolic supraclavicular and retroperitoneal nodes. -cycle 1 of salvage Taxol/carboplatin chemotherapy on 08/30/2012  3. Hypermetabolic activity at the right supraglottic larynx on the PET scan 04/17/2012 with associated soft tissue fullness status post a negative ENT evaluation by Dr. Pollyann Kennedy.  4. History of aortic stenosis-status post aortic valve replacement and coronary artery bypass surgery in November 2012.  5. Mucositis following cycle 1 chemotherapy. Resolved.  6. Skin breakdown at the perineum and penis secondary to chemotherapy and radiation. Resolved.  7. 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 He takes Pyridium as needed  8. Bone pain after Neulasta with cycle 1 and cycle 2 Taxol/carboplatin 9. Generalized seizure 09/24/2012-negative brain CT,? Etiology. We will followup on the brain MRI results and neurology evaluation. I recommended he not drive and began the seizure medication prescribed by neurology.  Disposition:  He has completed 2 cycles of Taxol/carboplatin chemotherapy. He had a seizure on day 4 following cycle 2. No apparent cause for the seizure. We will followup on the brain MRI result. I recommended he began the seizure medication prescribed by neurology. I cannot relate the seizure to the chemotherapy or Neulasta.  Mr. Eley will use hydrocodone as needed for pain following Neulasta. He will complete cycle 3 Taxol/carboplatin today. He will return for an office visit and cycle 4 in 3 weeks. The plan is to obtain a restaging CT evaluation after cycle 4.  Thornton Papas, MD  10/11/2012  1:44 PM

## 2012-10-11 NOTE — Telephone Encounter (Signed)
Received faxed report of MRI from [facility], reviewed by Dr. Truett Perna. Called pt, brain MRI does not show any cancer. Pt voiced understanding.

## 2012-10-11 NOTE — Telephone Encounter (Signed)
Per staff message and POF I have scheduled appts.  JMW  

## 2012-10-11 NOTE — Telephone Encounter (Signed)
gv and printed appt schedule for pt for Feb and March,...emailed michelle to add tx

## 2012-10-11 NOTE — Patient Instructions (Signed)
Do not drive, take seizure medicine as prescribed

## 2012-10-12 ENCOUNTER — Ambulatory Visit (HOSPITAL_BASED_OUTPATIENT_CLINIC_OR_DEPARTMENT_OTHER): Payer: BC Managed Care – PPO

## 2012-10-12 VITALS — BP 122/66 | HR 91 | Temp 96.8°F

## 2012-10-12 DIAGNOSIS — C211 Malignant neoplasm of anal canal: Secondary | ICD-10-CM

## 2012-10-12 DIAGNOSIS — Z5189 Encounter for other specified aftercare: Secondary | ICD-10-CM

## 2012-10-12 MED ORDER — PEGFILGRASTIM INJECTION 6 MG/0.6ML
6.0000 mg | Freq: Once | SUBCUTANEOUS | Status: AC
  Administered 2012-10-12: 6 mg via SUBCUTANEOUS

## 2012-10-14 ENCOUNTER — Other Ambulatory Visit: Payer: Self-pay | Admitting: *Deleted

## 2012-10-14 DIAGNOSIS — G40109 Localization-related (focal) (partial) symptomatic epilepsy and epileptic syndromes with simple partial seizures, not intractable, without status epilepticus: Secondary | ICD-10-CM

## 2012-10-15 ENCOUNTER — Telehealth: Payer: Self-pay | Admitting: *Deleted

## 2012-10-15 NOTE — Telephone Encounter (Signed)
Wife called to confirm that MD did not want labs checked 10-12 days after chemo. Made her aware that MD did not need this done since his nadir labs were OK with 1st treatment and Neulasta.

## 2012-10-16 ENCOUNTER — Other Ambulatory Visit: Payer: Self-pay | Admitting: Radiation Oncology

## 2012-10-23 ENCOUNTER — Telehealth: Payer: Self-pay | Admitting: *Deleted

## 2012-10-23 NOTE — Telephone Encounter (Signed)
Spoke with wife: He does not have a fever and is not short of breath. Told her he does not need antibiotic for cold symptoms. Use whatever OTC cold meds he has used in the past to help his symptoms. Push po fluids. Salt water gargles and lozenges prn. Call for high fever or dyspnea.

## 2012-10-23 NOTE — Telephone Encounter (Signed)
@  1615-VM from wife reporting patient has "bad cold and sore throat". Asking for antibiotic.

## 2012-10-28 ENCOUNTER — Ambulatory Visit (HOSPITAL_COMMUNITY): Payer: BC Managed Care – PPO

## 2012-10-29 ENCOUNTER — Telehealth: Payer: Self-pay

## 2012-10-29 NOTE — Telephone Encounter (Signed)
Wife called to reschedule follow with Dr. Mitzi Hansen to take place after last cycle of chemotherapy and then ct scan to be scheduled.

## 2012-10-30 ENCOUNTER — Other Ambulatory Visit: Payer: Self-pay | Admitting: Oncology

## 2012-10-31 ENCOUNTER — Ambulatory Visit: Payer: BC Managed Care – PPO | Admitting: Radiation Oncology

## 2012-11-01 ENCOUNTER — Ambulatory Visit: Payer: BC Managed Care – PPO

## 2012-11-01 ENCOUNTER — Ambulatory Visit (HOSPITAL_BASED_OUTPATIENT_CLINIC_OR_DEPARTMENT_OTHER): Payer: BC Managed Care – PPO | Admitting: Oncology

## 2012-11-01 ENCOUNTER — Telehealth: Payer: Self-pay | Admitting: Oncology

## 2012-11-01 ENCOUNTER — Telehealth: Payer: Self-pay | Admitting: *Deleted

## 2012-11-01 ENCOUNTER — Other Ambulatory Visit (HOSPITAL_BASED_OUTPATIENT_CLINIC_OR_DEPARTMENT_OTHER): Payer: BC Managed Care – PPO | Admitting: Lab

## 2012-11-01 VITALS — BP 142/50 | HR 63 | Temp 97.9°F | Resp 18 | Ht 69.0 in | Wt 221.2 lb

## 2012-11-01 LAB — CBC WITH DIFFERENTIAL/PLATELET
BASO%: 0.3 % (ref 0.0–2.0)
Basophils Absolute: 0 10*3/uL (ref 0.0–0.1)
EOS%: 1.3 % (ref 0.0–7.0)
HCT: 30.2 % — ABNORMAL LOW (ref 38.4–49.9)
HGB: 9.4 g/dL — ABNORMAL LOW (ref 13.0–17.1)
LYMPH%: 15.9 % (ref 14.0–49.0)
MCH: 32.1 pg (ref 27.2–33.4)
MCHC: 31.1 g/dL — ABNORMAL LOW (ref 32.0–36.0)
MCV: 103.1 fL — ABNORMAL HIGH (ref 79.3–98.0)
NEUT%: 66.9 % (ref 39.0–75.0)
Platelets: 73 10*3/uL — ABNORMAL LOW (ref 140–400)
lymph#: 0.5 10*3/uL — ABNORMAL LOW (ref 0.9–3.3)

## 2012-11-01 NOTE — Telephone Encounter (Signed)
gv and printed appt schedule for pt for March...pt aware cs will contact with d/t of PET...s.w and emaield michelle

## 2012-11-01 NOTE — Progress Notes (Signed)
   [facility]    OFFICE PROGRESS NOTE   INTERVAL HISTORY:   He returns as scheduled. He completed another cycle of Taxol/carboplatin on 10/11/2012. He tolerated chemotherapy well. No bone pain following the Neulasta with this cycle. No difficulty with bowel or bladder function. Mr. Schmieg reports feeling well. He continues to work. No new neuropathy symptoms.  Objective:  Vital signs in last 24 hours:  Blood pressure 142/50, pulse 63, temperature 97.9 F (36.6 C), temperature source Oral, resp. rate 18, height 5\' 9"  (1.753 m), weight 221 lb 3.2 oz (100.336 kg).    HEENT: No thrush or ulcers Lymphatics: No cervical or supraclavicular nodes Resp: Lungs clear bilaterally Cardio: Regular rate and rhythm GI: No hepatosplenomegaly, nontender Vascular: No leg edema   Lab Results:  Lab Results  Component Value Date   WBC 3.2* 11/01/2012   HGB 9.4* 11/01/2012   HCT 30.2* 11/01/2012   MCV 103.1* 11/01/2012   PLT 73* 11/01/2012   ANC 2.1   Medications: I have reviewed the patient's current medications.  Assessment/Plan: 1.Squamous cell carcinoma of the anal canal, HPV positive.  2. Staging CT/PET scan with tiny lung nodules, hypermetabolic retroperitoneal, right inguinal, and left supraclavicular nodes . FNA biopsy of a left supraclavicular lymph node on 05/03/2012 confirmed metastatic squamous cell carcinoma. -Cycle 1 of 5-fluorouracil/mitomycin-C and concurrent radiation on 05/20/2012. Cycle 2 of 5-fluorouracil/mitomycin-C on 06/24/2012 with completion of radiation on 06/28/2012. -Restaging PET scan 08/02/2012 with resolution of the hypermetabolic anal primary and right inguinal lymph node. Persistent hypermetabolic supraclavicular and retroperitoneal nodes. -cycle 1 of salvage Taxol/carboplatin chemotherapy on 08/30/2012  3. Hypermetabolic activity at the right supraglottic larynx on the PET scan 04/17/2012 with associated soft tissue fullness status post a negative  ENT evaluation by Dr. Pollyann Kennedy.  4. History of aortic stenosis-status post aortic valve replacement and coronary artery bypass surgery in November 2012.  5. Mucositis following cycle 1 chemotherapy. Resolved.  6. Skin breakdown at the perineum and penis secondary to chemotherapy and radiation. Resolved.  7. 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 Resolved 8. Bone pain after Neulasta with cycle 1 and cycle 2 Taxol/carboplatin , no significant bone pain after cycle 3 9. Generalized seizure 09/24/2012-negative brain CT,? Etiology. He has been evaluated by neurology and is now taking Vimpat. 10. Thrombocytopenia secondary chemotherapy  Disposition:  He has completed 3 cycles of Taxol/carboplatin therapy. Mr. Normoyle has tolerated the chemotherapy well. He now has moderate thrombocytopenia. Chemotherapy will be held today. He will return for cycle 4 of Taxol/carboplatin on 11/08/2012. He will be scheduled for a restaging PET scan prior to an office visit on 11/27/2012.   Thornton Papas, MD  11/01/2012  6:48 PM

## 2012-11-01 NOTE — Telephone Encounter (Signed)
Per staff message and POF I have scheduled appts.  JMW  

## 2012-11-02 ENCOUNTER — Ambulatory Visit: Payer: BC Managed Care – PPO

## 2012-11-08 ENCOUNTER — Ambulatory Visit (HOSPITAL_BASED_OUTPATIENT_CLINIC_OR_DEPARTMENT_OTHER): Payer: BC Managed Care – PPO

## 2012-11-08 ENCOUNTER — Other Ambulatory Visit: Payer: BC Managed Care – PPO | Admitting: Lab

## 2012-11-08 ENCOUNTER — Other Ambulatory Visit (HOSPITAL_BASED_OUTPATIENT_CLINIC_OR_DEPARTMENT_OTHER): Payer: BC Managed Care – PPO | Admitting: Lab

## 2012-11-08 ENCOUNTER — Telehealth: Payer: Self-pay | Admitting: *Deleted

## 2012-11-08 ENCOUNTER — Ambulatory Visit: Payer: BC Managed Care – PPO

## 2012-11-08 DIAGNOSIS — C211 Malignant neoplasm of anal canal: Secondary | ICD-10-CM

## 2012-11-08 LAB — CBC WITH DIFFERENTIAL/PLATELET
Basophils Absolute: 0 10*3/uL (ref 0.0–0.1)
EOS%: 5 % (ref 0.0–7.0)
Eosinophils Absolute: 0.2 10*3/uL (ref 0.0–0.5)
LYMPH%: 16 % (ref 14.0–49.0)
MCH: 32.7 pg (ref 27.2–33.4)
MCV: 103.3 fL — ABNORMAL HIGH (ref 79.3–98.0)
MONO%: 13.4 % (ref 0.0–14.0)
NEUT#: 2.2 10*3/uL (ref 1.5–6.5)
Platelets: 103 10*3/uL — ABNORMAL LOW (ref 140–400)
RBC: 3.06 10*6/uL — ABNORMAL LOW (ref 4.20–5.82)
RDW: 18.3 % — ABNORMAL HIGH (ref 11.0–14.6)
nRBC: 0 % (ref 0–0)

## 2012-11-08 MED ORDER — DIPHENHYDRAMINE HCL 50 MG/ML IJ SOLN
50.0000 mg | Freq: Once | INTRAMUSCULAR | Status: AC
  Administered 2012-11-08: 50 mg via INTRAVENOUS

## 2012-11-08 MED ORDER — DEXAMETHASONE SODIUM PHOSPHATE 4 MG/ML IJ SOLN
20.0000 mg | Freq: Once | INTRAMUSCULAR | Status: AC
  Administered 2012-11-08: 20 mg via INTRAVENOUS

## 2012-11-08 MED ORDER — SODIUM CHLORIDE 0.9 % IV SOLN
175.0000 mg/m2 | Freq: Once | INTRAVENOUS | Status: AC
  Administered 2012-11-08: 384 mg via INTRAVENOUS
  Filled 2012-11-08: qty 64

## 2012-11-08 MED ORDER — ONDANSETRON 16 MG/50ML IVPB (CHCC)
16.0000 mg | Freq: Once | INTRAVENOUS | Status: AC
  Administered 2012-11-08: 16 mg via INTRAVENOUS

## 2012-11-08 MED ORDER — SODIUM CHLORIDE 0.9 % IV SOLN
Freq: Once | INTRAVENOUS | Status: AC
  Administered 2012-11-08: 13:00:00 via INTRAVENOUS

## 2012-11-08 MED ORDER — SODIUM CHLORIDE 0.9 % IV SOLN
510.0000 mg | Freq: Once | INTRAVENOUS | Status: AC
  Administered 2012-11-08: 510 mg via INTRAVENOUS
  Filled 2012-11-08: qty 51

## 2012-11-08 MED ORDER — FAMOTIDINE IN NACL 20-0.9 MG/50ML-% IV SOLN
20.0000 mg | Freq: Once | INTRAVENOUS | Status: AC
  Administered 2012-11-08: 20 mg via INTRAVENOUS

## 2012-11-08 NOTE — Patient Instructions (Addendum)
[facility] Discharge Instructions for Patients Receiving Chemotherapy  Today you received the following chemotherapy agents Taxol and Carboplatin.  To help prevent nausea and vomiting after your treatment, we encourage you to take your nausea medication as prescribed. .   If you develop nausea and vomiting that is not controlled by your nausea medication, call the clinic. If it is after clinic hours your family physician or the after hours number for the clinic or go to the Emergency Department.   BELOW ARE SYMPTOMS THAT SHOULD BE REPORTED IMMEDIATELY:  *FEVER GREATER THAN 100.5 F  *CHILLS WITH OR WITHOUT FEVER  NAUSEA AND VOMITING THAT IS NOT CONTROLLED WITH YOUR NAUSEA MEDICATION  *UNUSUAL SHORTNESS OF BREATH  *UNUSUAL BRUISING OR BLEEDING  TENDERNESS IN MOUTH AND THROAT WITH OR WITHOUT PRESENCE OF ULCERS  *URINARY PROBLEMS  *BOWEL PROBLEMS  UNUSUAL RASH Items with * indicate a potential emergency and should be followed up as soon as possible.  Please let the nurse know about any problems that you may have experienced. Feel free to call the clinic you have any questions or concerns. The clinic phone number is [phone].   I have been informed and understand all the instructions given to me. I know to contact the clinic, my physician, or go to the Emergency Department if any problems should occur. I do not have any questions at this time, but understand that I may call the clinic during office hours   should I have any questions or need assistance in obtaining follow up care.    __________________________________________  _____________  __________ Signature of Patient or Authorized Representative            Date                   Time    __________________________________________ Nurse's Signature    

## 2012-11-08 NOTE — Telephone Encounter (Signed)
Per patient message he canceled his appts for today. The patiet wants to reschedule. Message given to the desk RN.  JMW

## 2012-11-09 ENCOUNTER — Ambulatory Visit (HOSPITAL_BASED_OUTPATIENT_CLINIC_OR_DEPARTMENT_OTHER): Payer: BC Managed Care – PPO

## 2012-11-09 ENCOUNTER — Ambulatory Visit: Payer: BC Managed Care – PPO

## 2012-11-09 VITALS — BP 142/73 | HR 100 | Temp 97.7°F

## 2012-11-09 MED ORDER — PEGFILGRASTIM INJECTION 6 MG/0.6ML
6.0000 mg | Freq: Once | SUBCUTANEOUS | Status: AC
  Administered 2012-11-09: 6 mg via SUBCUTANEOUS

## 2012-11-11 ENCOUNTER — Other Ambulatory Visit: Payer: Self-pay | Admitting: *Deleted

## 2012-11-14 ENCOUNTER — Telehealth: Payer: Self-pay | Admitting: Oncology

## 2012-11-14 ENCOUNTER — Encounter: Payer: Self-pay | Admitting: Oncology

## 2012-11-14 NOTE — Progress Notes (Signed)
11/14/2012 Good Morning Dr. Truett Perna,  Per AIM guidelines for authorizing PET SCANS, this request has gone to MD review.  His last scan was 07/2012 and the nurse stated to me because of his squamous cell diagnosis she could not authorize this procedure.  The necessary information is listed below for the MD REVIEW process.   [phone]  [address]  Patient's Date of Birth:  27-Feb-1946  This is scheduled for November 25, 2012.  Imagene Riches [phone]

## 2012-11-14 NOTE — Telephone Encounter (Signed)
LVOM for pt in re to lab 3/17 @ 10:45.

## 2012-11-18 ENCOUNTER — Other Ambulatory Visit (HOSPITAL_BASED_OUTPATIENT_CLINIC_OR_DEPARTMENT_OTHER): Payer: BC Managed Care – PPO | Admitting: Lab

## 2012-11-18 DIAGNOSIS — C211 Malignant neoplasm of anal canal: Secondary | ICD-10-CM

## 2012-11-18 LAB — CBC WITH DIFFERENTIAL/PLATELET
Basophils Absolute: 0 10*3/uL (ref 0.0–0.1)
Eosinophils Absolute: 0 10*3/uL (ref 0.0–0.5)
HCT: 33 % — ABNORMAL LOW (ref 38.4–49.9)
HGB: 10.6 g/dL — ABNORMAL LOW (ref 13.0–17.1)
MCH: 33.1 pg (ref 27.2–33.4)
MCV: 103.1 fL — ABNORMAL HIGH (ref 79.3–98.0)
MONO%: 11.3 % (ref 0.0–14.0)
NEUT#: 4.4 10*3/uL (ref 1.5–6.5)
NEUT%: 76.6 % — ABNORMAL HIGH (ref 39.0–75.0)
Platelets: 83 10*3/uL — ABNORMAL LOW (ref 140–400)
RDW: 17.2 % — ABNORMAL HIGH (ref 11.0–14.6)

## 2012-11-20 ENCOUNTER — Encounter: Payer: Self-pay | Admitting: Nurse Practitioner

## 2012-11-20 ENCOUNTER — Ambulatory Visit (INDEPENDENT_AMBULATORY_CARE_PROVIDER_SITE_OTHER): Payer: BC Managed Care – PPO | Admitting: Nurse Practitioner

## 2012-11-20 VITALS — BP 103/62 | HR 78 | Ht 71.0 in | Wt 222.0 lb

## 2012-11-20 DIAGNOSIS — G40309 Generalized idiopathic epilepsy and epileptic syndromes, not intractable, without status epilepticus: Secondary | ICD-10-CM

## 2012-11-20 DIAGNOSIS — G40409 Other generalized epilepsy and epileptic syndromes, not intractable, without status epilepticus: Secondary | ICD-10-CM

## 2012-11-20 NOTE — Patient Instructions (Signed)
Continue Vimpat 100mg  daily Call for any further seizure activity.  No driving for 6 months. (July) 2014 F/U six months

## 2012-11-20 NOTE — Progress Notes (Signed)
HPI: One seizure suffered in January on his second day of oxycodone. This was a generalized tonic-clonic seizure lasting for a couple of minutes that was witnessed. Date of last seizure was September 24, 2012. 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 EEG was abnormal with evidence of mild left-sided slowing involving the left frontal temporal region but no evidence of epileptiform  discharges Denies  staring spells, confusion, sleep disturbances, lapses of time, headache and bowel and bladder incontinence. No evidence of tongue biting.    ROS:  negative  Physical Exam General: well developed, well nourished, seated, in no evident distress Head: head normocephalic and atraumatic. Orohparynx benign Neck: supple with no carotid or supraclavicular bruits Cardiovascular: regular rate and rhythm, no murmurs  Neurologic Exam Mental Status: Awake and fully alert. Oriented to place and time. Recent and remote memory intact. Attention span, concentration and fund of knowledge appropriate. Mood and affect appropriate.  Cranial Nerves: Fundoscopic exam reveals sharp disc margins. Pupils equal, briskly reactive to light. Extraocular movements full without nystagmus. Visual fields full to confrontation. Hearing intact and symmetric to finer snap. Facial sensation intact. Face, tongue, palate move normally and symmetrically. Neck flexion and extension normal.  Motor: Normal bulk and tone. Normal strength in all tested extremity muscles. Sensory.: intact to tough and pinprick and vibratory.  Coordination: Rapid alternating movements normal in all extremities. Finger-to-nose and heel-to-shin performed accurately bilaterally. Gait and Station: Arises from chair without difficulty. Stance is normal. Gait demonstrates normal stride length and balance . Able to heel, toe and tandem walk without difficulty.  Reflexes: 1+ and symmetric. Toes downgoing.     ASSESSMENT::Seizure  disorder currently on  Vimpat without further events.      PLAN:   Continue Vimpat as directed

## 2012-11-22 ENCOUNTER — Telehealth: Payer: Self-pay | Admitting: *Deleted

## 2012-11-22 NOTE — Telephone Encounter (Signed)
[employer] won't approve PET scan at this time. Requires a peer-to-peer communication according to managed care. Wife asking if scan can't be done now, can he have his 5th cycle of chemo on 3/28 (due) and then his 6th one when due. By this time, he will be on Medicare and will be able to have the PET covered. Is office visit 3/26 with Dr. Truett Perna and Dr. Mitzi Hansen still necessary if PET can't be done?

## 2012-11-25 ENCOUNTER — Encounter (HOSPITAL_COMMUNITY): Payer: BC Managed Care – PPO

## 2012-11-26 ENCOUNTER — Telehealth: Payer: Self-pay | Admitting: *Deleted

## 2012-11-26 NOTE — Telephone Encounter (Signed)
Received message from pt's wife asking for clarification again regarding "does he need to come in for tomorrow's appt"  Also wanting update on PET SCAN approval.  Returned call and spoke with pt's wife informing her that Dr. Truett Perna has completed everything on this end to get PET scan approved and we should hear back this week with information.  Per Dr. Truett Perna; pt does not need to come tomorrow for appt.  Pt's wife verbalized understanding and states that pt is feeling well with no problems.

## 2012-11-27 ENCOUNTER — Ambulatory Visit: Payer: BC Managed Care – PPO | Admitting: Radiation Oncology

## 2012-11-27 ENCOUNTER — Other Ambulatory Visit: Payer: BC Managed Care – PPO | Admitting: Lab

## 2012-11-27 ENCOUNTER — Telehealth: Payer: Self-pay | Admitting: *Deleted

## 2012-11-27 ENCOUNTER — Encounter: Payer: Self-pay | Admitting: Oncology

## 2012-11-27 ENCOUNTER — Ambulatory Visit: Payer: BC Managed Care – PPO | Admitting: Oncology

## 2012-11-27 NOTE — Telephone Encounter (Signed)
Faxed required documents including pathology, letter of necessity, & last office visit to [facility] to [place], [place] for appeal for PET SCAN.  Fax # [phone]

## 2012-11-28 ENCOUNTER — Telehealth: Payer: Self-pay | Admitting: *Deleted

## 2012-11-28 NOTE — Telephone Encounter (Signed)
Call from pt's insurance company reporting denial for PET scan has been upheld. They will fax denial to office.

## 2012-11-29 ENCOUNTER — Telehealth: Payer: Self-pay | Admitting: *Deleted

## 2012-11-29 DIAGNOSIS — C218 Malignant neoplasm of overlapping sites of rectum, anus and anal canal: Secondary | ICD-10-CM

## 2012-11-29 NOTE — Telephone Encounter (Signed)
Per Dr. Truett Perna: pt will have CT C/A/P as soon as possible. Orders entered, notified managed care. Instructed pt's wife to call radiology scheduling. She voiced understanding.

## 2012-12-02 ENCOUNTER — Ambulatory Visit (HOSPITAL_COMMUNITY)
Admission: RE | Admit: 2012-12-02 | Discharge: 2012-12-02 | Disposition: A | Discharge status: Home / Self Care | Payer: BC Managed Care – PPO | Source: Ambulatory Visit | Attending: Oncology | Admitting: Oncology

## 2012-12-02 ENCOUNTER — Encounter: Payer: Self-pay | Admitting: Oncology

## 2012-12-02 DIAGNOSIS — Z923 Personal history of irradiation: Secondary | ICD-10-CM | POA: Insufficient documentation

## 2012-12-02 DIAGNOSIS — C218 Malignant neoplasm of overlapping sites of rectum, anus and anal canal: Secondary | ICD-10-CM

## 2012-12-02 DIAGNOSIS — M899 Disorder of bone, unspecified: Secondary | ICD-10-CM | POA: Insufficient documentation

## 2012-12-02 DIAGNOSIS — Z9221 Personal history of antineoplastic chemotherapy: Secondary | ICD-10-CM | POA: Insufficient documentation

## 2012-12-02 DIAGNOSIS — C20 Malignant neoplasm of rectum: Secondary | ICD-10-CM | POA: Insufficient documentation

## 2012-12-02 MED ORDER — IOHEXOL 300 MG/ML  SOLN
100.0000 mL | Freq: Once | INTRAMUSCULAR | Status: AC | PRN
  Administered 2012-12-02: 100 mL via INTRAVENOUS

## 2012-12-02 NOTE — Progress Notes (Signed)
[employer] approved ct chest ab/pelvis @ [facility] 12/02/12 auth # [id number].

## 2012-12-04 ENCOUNTER — Other Ambulatory Visit: Payer: Self-pay | Admitting: *Deleted

## 2012-12-04 ENCOUNTER — Telehealth: Payer: Self-pay | Admitting: Oncology

## 2012-12-04 ENCOUNTER — Telehealth: Payer: Self-pay | Admitting: *Deleted

## 2012-12-04 ENCOUNTER — Ambulatory Visit (HOSPITAL_BASED_OUTPATIENT_CLINIC_OR_DEPARTMENT_OTHER): Payer: BC Managed Care – PPO | Admitting: Oncology

## 2012-12-04 ENCOUNTER — Ambulatory Visit
Admission: RE | Admit: 2012-12-04 | Discharge: 2012-12-04 | Disposition: A | Discharge status: Home / Self Care | Payer: BC Managed Care – PPO | Source: Ambulatory Visit | Attending: Radiation Oncology | Admitting: Radiation Oncology

## 2012-12-04 VITALS — BP 112/56 | HR 69 | Temp 98.4°F | Wt 222.3 lb

## 2012-12-04 VITALS — BP 134/69 | HR 67 | Temp 97.8°F | Resp 20 | Ht 71.0 in | Wt 221.3 lb

## 2012-12-04 DIAGNOSIS — C211 Malignant neoplasm of anal canal: Secondary | ICD-10-CM

## 2012-12-04 DIAGNOSIS — C778 Secondary and unspecified malignant neoplasm of lymph nodes of multiple regions: Secondary | ICD-10-CM

## 2012-12-04 DIAGNOSIS — B977 Papillomavirus as the cause of diseases classified elsewhere: Secondary | ICD-10-CM

## 2012-12-04 DIAGNOSIS — C218 Malignant neoplasm of overlapping sites of rectum, anus and anal canal: Secondary | ICD-10-CM

## 2012-12-04 MED ORDER — HYDROCODONE-ACETAMINOPHEN 5-325 MG PO TABS: 1.0000 | ORAL_TABLET | Freq: Four times a day (QID) | ORAL | Status: DC | PRN

## 2012-12-04 NOTE — Telephone Encounter (Signed)
Per staff phone call and POF I have schedueld appts.  JMW  

## 2012-12-04 NOTE — Progress Notes (Signed)
   [facility]    OFFICE PROGRESS NOTE   INTERVAL HISTORY:   He returns as scheduled. He completed a fourth cycle of Taxol/carboplatin on 11/08/2012. He received Neulasta on 11/09/2012. He had mild bone pain following Neulasta. The pain was relieved with hydrocodone. No bleeding. No neuropathy symptoms. No other complaint. No seizures.  Objective:  Vital signs in last 24 hours:  Blood pressure 134/69, pulse 67, temperature 97.8 F (36.6 C), temperature source Oral, resp. rate 20, height 5\' 11"  (1.803 m), weight 221 lb 4.8 oz (100.381 kg).    HEENT: No thrush or ulcers Lymphatics: No cervical, supraclavicular, axillary, or inguinal nodes Resp: Lungs clear bilaterally Cardio: Regular rate and rhythm GI: No hepatosplenomegaly, nontender Vascular:  No leg edema,? Small palpable cord without associated erythema at the left forearm   Lab Results:  Lab Results  Component Value Date   WBC 5.7 11/18/2012   HGB 10.6* 11/18/2012   HCT 33.0* 11/18/2012   MCV 103.1* 11/18/2012   PLT 83* 11/18/2012   X-rays: No evidence of metastatic disease in the chest, incomplete visualization of previously described left supraclavicular nodes. An index node has decreased in size, now measuring 7 mm. Resolution of retroperitoneal adenopathy since 08/02/2012. Normal liver, no residual and a rectal primary. No pelvic adenopathy. No evidence of metastatic disease in the abdomen or pelvis.   Medications: I have reviewed the patient's current medications.  Assessment/Plan: 1.Squamous cell carcinoma of the anal canal, HPV positive.  2. Staging CT/PET scan with tiny lung nodules, hypermetabolic retroperitoneal, right inguinal, and left supraclavicular nodes . FNA biopsy of a left supraclavicular lymph node on 05/03/2012 confirmed metastatic squamous cell carcinoma. -Cycle 1 of 5-fluorouracil/mitomycin-C and concurrent radiation on 05/20/2012. Cycle 2 of 5-fluorouracil/mitomycin-C on 06/24/2012  with completion of radiation on 06/28/2012. -Restaging PET scan 08/02/2012 with resolution of the hypermetabolic anal primary and right inguinal lymph node. Persistent hypermetabolic supraclavicular and retroperitoneal nodes. -cycle 1 of salvage Taxol/carboplatin chemotherapy on 08/30/2012 . Restaging CTs after 4 cycles of Taxol/carboplatin confirmed resolution of retroperitoneal lymphadenopathy and a decrease in the size of a previously noted left supraclavicular node. 3. Hypermetabolic activity at the right supraglottic larynx on the PET scan 04/17/2012 with associated soft tissue fullness status post a negative ENT evaluation by Dr. Pollyann Kennedy.  4. History of aortic stenosis-status post aortic valve replacement and coronary artery bypass surgery in November 2012.  5. Mucositis following cycle 1 chemotherapy. Resolved.  6. Skin breakdown at the perineum and penis secondary to chemotherapy and radiation. Resolved.  7. 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 Bone pain after Neulasta with cycle 1 and cycle 2 Taxol/carboplatin , no significant bone pain after cycle 3  9. Generalized seizure 09/24/2012-negative brain CT,? Etiology. He has been evaluated by neurology and is now taking Vimpat.  10. History of Thrombocytopenia secondary chemotherapy    Disposition:  He has completed 4 cycles of Taxol/carboplatin. He has tolerated chemotherapy well and the restaging CT scans reveal improvement in the metastatic lymphadenopathy. The plan is to complete 2 more cycles of Taxol/carboplatin. He will return for cycle 5 on 12/06/2012. Mr. Krinke is scheduled for an office visit and cycle 6 on 12/26/2012.   Thornton Papas, MD  12/04/2012  10:11 AM

## 2012-12-04 NOTE — Progress Notes (Signed)
Radiation Oncology         [phone] ________________________________  Name: Kirk [name] MRN: [id number]  Date: 12/04/2012  DOB: [birth year]  Follow-Up Visit Note  CC: [name], MD  [name], MD  Diagnosis:   Squamous cell carcinoma of the anal canal  Interval Since Last Radiation:  7 months   Narrative:  The patient returns today for routine follow-up.  The patient indicates that he is doing very well at this time. He is very pleased with his care as well as his recent results. His imaging was repeated on 12/03/1998 413. There is an interval decrease in the left supraclavicular lymphadenopathy with no evidence of metastatic disease within the chest or blood neck. Resolution of retroperitoneal adenopathy was also seen with no other evidence of residual metastatic disease within the abdomen or pelvis.  The patient is continuing with additional chemotherapy through Dr. Truett Perna. He has remained quite active. He denies any ongoing issues in terms of anal/perianal soreness. Occasional loose stools with some apparent loss of control but this isn't in frequent and it has not worsened.                               ALLERGIES:  is allergic to cat hair extract.  Meds: Current Outpatient Prescriptions  Medication Sig Dispense Refill  . amitriptyline (ELAVIL) 25 MG tablet Take 25 mg by mouth at bedtime.       Marland Kitchen aspirin EC 81 MG tablet Take 81 mg by mouth daily.      . ASTEPRO 0.15 % SOLN Place 2 sprays into both nostrils at bedtime.       Marland Kitchen atorvastatin (LIPITOR) 20 MG tablet Take 80 mg by mouth daily.       . Cholecalciferol 1000 UNITS tablet 185 mg. Take 185 mg by mouth daily.      Marland Kitchen HYDROcodone-acetaminophen (NORCO/VICODIN) 5-325 MG per tablet Take 1-2 tablets by mouth every 6 (six) hours as needed for pain.  60 tablet  1  . lisinopril-hydrochlorothiazide (PRINZIDE,ZESTORETIC) 10-12.5 MG per tablet Take 1 tablet by mouth daily.      . metoprolol tartrate (LOPRESSOR)  25 MG tablet Take 25 mg by mouth 2 (two) times daily.      . Multiple Vitamin (MULTIVITAMIN) tablet Take 1 tablet by mouth daily.      Marland Kitchen NASONEX 50 MCG/ACT nasal spray Place 2 sprays into the nose daily.       . prochlorperazine (COMPAZINE) 10 MG tablet Take 1 tablet (10 mg total) by mouth every 6 (six) hours as needed.  30 tablet  2  . saxagliptin HCl (ONGLYZA) 5 MG TABS tablet Take 5 mg by mouth daily.      Marland Kitchen VIMPAT 100 MG TABS Take 100 mg by mouth daily.       . Vitamins-Lipotropics (LIPO-FLAVONOID PLUS) TABS Take 1 tablet by mouth 3 (three) times daily after meals.         No current facility-administered medications for this encounter.    Physical Findings: The patient is in no acute distress. Patient is alert and oriented.  weight is 222 lb 4.8 oz (100.835 kg). His temperature is 98.4 F (36.9 C). His blood pressure is 112/56 and his pulse is 69. His oxygen saturation is 98%. .   General: Well-developed, in no acute distress HEENT: Normocephalic, atraumatic Cardiovascular: Regular rate and rhythm Respiratory: Clear to auscultation bilaterally GI: Soft, nontender, normal bowel sounds  Extremities: No edema present Lymph: No inguinal adenopathy Rectal: Unremarkable exam except some radiation effect which is mild externally, no blood on exam glove   Lab Findings: Lab Results  Component Value Date   WBC 5.7 11/18/2012   HGB 10.6* 11/18/2012   HCT 33.0* 11/18/2012   MCV 103.1* 11/18/2012   PLT 83* 11/18/2012     Radiographic Findings: Ct Chest W Contrast  12/02/2012  *RADIOLOGY REPORT*  Clinical Data:  Restaging of anorectal primary.  Diagnosed 08/13. Chemotherapy and radiation therapy complete.  Evaluate abdominal lymph nodes.  CT CHEST, ABDOMEN AND PELVIS WITH CONTRAST  Technique: Contiguous axial images of the chest abdomen and pelvis were obtained after IV contrast administration.  Contrast: 100  ml Omnipaque-300  Comparison: PET 08/02/2012.  Diagnostic CTs of 04/11/2012.  CT  CHEST  Findings: Lung windows demonstrate minimal nodularity in the posterior right upper lobe, including on image 20/series 5.  This is favored to be post infectious or inflammatory, and was likely present on the prior.  Post infectious or inflammatory mucus filled bronchi in the medial left lower lobe on image 45/series 5, also unchanged.  Soft tissue windows demonstrate incomplete visualization of the previously described left supraclavicular nodes.  Index node from the prior PET is likely decreased in size.  Measures 7 mm on image 2/series 2 versus 1.1 cm on the prior.  Prior median sternotomy. Normal heart size without pericardial or pleural effusion.  Prior aortic valve repair. No central pulmonary embolism, on this non-dedicated study.  Extensive calcified thoracic nodes. No mediastinal or hilar adenopathy.  IMPRESSION:  1.  Although the left supraclavicular nodes detailed on prior exams are incompletely imaged, they have likely interval decrease in size since the 08/02/2012 exam. 2.  Otherwise, no evidence of metastatic disease within the chest or low neck. 3.  Old granulomatous disease.  Similar right upper lobe nodularity which is likely post infectious or inflammatory.  CT ABDOMEN AND PELVIS  Findings:  Normal liver, spleen, stomach, pancreas, gallbladder, biliary tract, adrenal glands, kidneys.  Retroaortic left renal vein.  Resolution of retroperitoneal adenopathy since 08/02/2012.  No residual anorectal primary seen.  There is perirectal edema which is presumably treatment related. Normal terminal ileum and appendix.  Normal small bowel without abdominal ascites.    No evidence of omental or peritoneal disease.  No pelvic adenopathy.    Normal urinary bladder and prostate.  No significant free fluid.  Similar sclerotic lesion in the left acetabulum, likely a bone island.  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  Similar remote left eleventh rib trauma.  IMPRESSION:  1.  Resolution of  retroperitoneal adenopathy. 2.  No evidence of residual metastatic disease within the abdomen or pelvis.   Original Report Authenticated By: Jeronimo Greaves, M.D.    Ct Abdomen Pelvis W Contrast  12/02/2012  *RADIOLOGY REPORT*  Clinical Data:  Restaging of anorectal primary.  Diagnosed 08/13. Chemotherapy and radiation therapy complete.  Evaluate abdominal lymph nodes.  CT CHEST, ABDOMEN AND PELVIS WITH CONTRAST  Technique: Contiguous axial images of the chest abdomen and pelvis were obtained after IV contrast administration.  Contrast: 100  ml Omnipaque-300  Comparison: PET 08/02/2012.  Diagnostic CTs of 04/11/2012.  CT CHEST  Findings: Lung windows demonstrate minimal nodularity in the posterior right upper lobe, including on image 20/series 5.  This is favored to be post infectious or inflammatory, and was likely present on the prior.  Post infectious or inflammatory mucus filled bronchi in the medial left lower  lobe on image 45/series 5, also unchanged.  Soft tissue windows demonstrate incomplete visualization of the previously described left supraclavicular nodes.  Index node from the prior PET is likely decreased in size.  Measures 7 mm on image 2/series 2 versus 1.1 cm on the prior.  Prior median sternotomy. Normal heart size without pericardial or pleural effusion.  Prior aortic valve repair. No central pulmonary embolism, on this non-dedicated study.  Extensive calcified thoracic nodes. No mediastinal or hilar adenopathy.  IMPRESSION:  1.  Although the left supraclavicular nodes detailed on prior exams are incompletely imaged, they have likely interval decrease in size since the 08/02/2012 exam. 2.  Otherwise, no evidence of metastatic disease within the chest or low neck. 3.  Old granulomatous disease.  Similar right upper lobe nodularity which is likely post infectious or inflammatory.  CT ABDOMEN AND PELVIS  Findings:  Normal liver, spleen, stomach, pancreas, gallbladder, biliary tract, adrenal glands,  kidneys.  Retroaortic left renal vein.  Resolution of retroperitoneal adenopathy since 08/02/2012.  No residual anorectal primary seen.  There is perirectal edema which is presumably treatment related. Normal terminal ileum and appendix.  Normal small bowel without abdominal ascites.    No evidence of omental or peritoneal disease.  No pelvic adenopathy.    Normal urinary bladder and prostate.  No significant free fluid.  Similar sclerotic lesion in the left acetabulum, likely a bone island.  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  Similar remote left eleventh rib trauma.  IMPRESSION:  1.  Resolution of retroperitoneal adenopathy. 2.  No evidence of residual metastatic disease within the abdomen or pelvis.   Original Report Authenticated By: Jeronimo Greaves, M.D.     Impression:    The patient is doing well status post chemoradiotherapy for his squamous cell carcinoma of the anal canal with additional consolidative chemotherapy. He is very pleased with both his cancer response as well as how he has been feeling.  Plan:  The patient will followup in 6 months for ongoing followup.  I spent 15 minutes with the patient today, the majority of which was spent counseling the patient on the diagnosis of cancer and coordinating care.   Radene Gunning, M.D., Ph.D.

## 2012-12-04 NOTE — Progress Notes (Signed)
Patient here for routine follow up completion of radiation  to  anus on10/25/13.Denies pain.Has completed 4 of 6 cycles of chemotherapy.There has been some improvement in metastatic lymphadenopathy.Bowels are good.

## 2012-12-06 ENCOUNTER — Other Ambulatory Visit (HOSPITAL_BASED_OUTPATIENT_CLINIC_OR_DEPARTMENT_OTHER): Payer: BC Managed Care – PPO | Admitting: Lab

## 2012-12-06 ENCOUNTER — Ambulatory Visit (HOSPITAL_BASED_OUTPATIENT_CLINIC_OR_DEPARTMENT_OTHER): Payer: BC Managed Care – PPO

## 2012-12-06 ENCOUNTER — Other Ambulatory Visit: Payer: Self-pay | Admitting: Oncology

## 2012-12-06 VITALS — BP 111/66 | HR 60 | Temp 98.4°F

## 2012-12-06 DIAGNOSIS — C218 Malignant neoplasm of overlapping sites of rectum, anus and anal canal: Secondary | ICD-10-CM

## 2012-12-06 DIAGNOSIS — C20 Malignant neoplasm of rectum: Secondary | ICD-10-CM

## 2012-12-06 DIAGNOSIS — Z5111 Encounter for antineoplastic chemotherapy: Secondary | ICD-10-CM

## 2012-12-06 DIAGNOSIS — C211 Malignant neoplasm of anal canal: Secondary | ICD-10-CM

## 2012-12-06 LAB — CBC WITH DIFFERENTIAL/PLATELET
BASO%: 0.3 % (ref 0.0–2.0)
EOS%: 3.1 % (ref 0.0–7.0)
HCT: 32.1 % — ABNORMAL LOW (ref 38.4–49.9)
MCH: 33.4 pg (ref 27.2–33.4)
MCHC: 32.4 g/dL (ref 32.0–36.0)
MONO#: 0.5 10*3/uL (ref 0.1–0.9)
NEUT%: 68.5 % (ref 39.0–75.0)
RBC: 3.11 10*6/uL — ABNORMAL LOW (ref 4.20–5.82)
RDW: 15.6 % — ABNORMAL HIGH (ref 11.0–14.6)
WBC: 3.8 10*3/uL — ABNORMAL LOW (ref 4.0–10.3)
lymph#: 0.6 10*3/uL — ABNORMAL LOW (ref 0.9–3.3)
nRBC: 0 % (ref 0–0)

## 2012-12-06 LAB — COMPREHENSIVE METABOLIC PANEL (CC13)
ALT: 21 U/L (ref 0–55)
AST: 20 U/L (ref 5–34)
Albumin: 3.5 g/dL (ref 3.5–5.0)
Alkaline Phosphatase: 61 U/L (ref 40–150)
BUN: 21.2 mg/dL (ref 7.0–26.0)
Chloride: 104 mEq/L (ref 98–107)
Potassium: 4.5 mEq/L (ref 3.5–5.1)

## 2012-12-06 MED ORDER — FAMOTIDINE IN NACL 20-0.9 MG/50ML-% IV SOLN
20.0000 mg | Freq: Once | INTRAVENOUS | Status: AC
  Administered 2012-12-06: 20 mg via INTRAVENOUS

## 2012-12-06 MED ORDER — ONDANSETRON 16 MG/50ML IVPB (CHCC)
16.0000 mg | Freq: Once | INTRAVENOUS | Status: AC
  Administered 2012-12-06: 16 mg via INTRAVENOUS

## 2012-12-06 MED ORDER — DIPHENHYDRAMINE HCL 50 MG/ML IJ SOLN
50.0000 mg | Freq: Once | INTRAMUSCULAR | Status: AC
  Administered 2012-12-06: 50 mg via INTRAVENOUS

## 2012-12-06 MED ORDER — SODIUM CHLORIDE 0.9 % IV SOLN
Freq: Once | INTRAVENOUS | Status: AC
  Administered 2012-12-06: 10:00:00 via INTRAVENOUS

## 2012-12-06 MED ORDER — DEXAMETHASONE SODIUM PHOSPHATE 4 MG/ML IJ SOLN
20.0000 mg | Freq: Once | INTRAMUSCULAR | Status: AC
  Administered 2012-12-06: 20 mg via INTRAVENOUS

## 2012-12-06 MED ORDER — PACLITAXEL CHEMO INJECTION 300 MG/50ML
175.0000 mg/m2 | Freq: Once | INTRAVENOUS | Status: AC
  Administered 2012-12-06: 384 mg via INTRAVENOUS
  Filled 2012-12-06: qty 64

## 2012-12-06 MED ORDER — SODIUM CHLORIDE 0.9 % IV SOLN
510.0000 mg | Freq: Once | INTRAVENOUS | Status: AC
  Administered 2012-12-06: 510 mg via INTRAVENOUS
  Filled 2012-12-06: qty 51

## 2012-12-06 NOTE — Patient Instructions (Addendum)
[facility] Discharge Instructions for Patients Receiving Chemotherapy  Today you received the following chemotherapy agents taxol,carboplatin To help prevent nausea and vomiting after your treatment, we encourage you to take your nausea medication   Take it as often as prescribed.   If you develop nausea and vomiting that is not controlled by your nausea medication, call the clinic. If it is after clinic hours your family physician or the after hours number for the clinic or go to the Emergency Department.   BELOW ARE SYMPTOMS THAT SHOULD BE REPORTED IMMEDIATELY:  *FEVER GREATER THAN 100.5 F  *CHILLS WITH OR WITHOUT FEVER  NAUSEA AND VOMITING THAT IS NOT CONTROLLED WITH YOUR NAUSEA MEDICATION  *UNUSUAL SHORTNESS OF BREATH  *UNUSUAL BRUISING OR BLEEDING  TENDERNESS IN MOUTH AND THROAT WITH OR WITHOUT PRESENCE OF ULCERS  *URINARY PROBLEMS  *BOWEL PROBLEMS  UNUSUAL RASH Items with * indicate a potential emergency and should be followed up as soon as possible.  If this is your first treatment one of the nurses will contact you 24 hours after your treatment. Please let the nurse know about any problems that you may have experienced. Feel free to call the clinic you have any questions or concerns. The clinic phone number is [phone].   I have been informed and understand all the instructions given to me. I know to contact the clinic, my physician, or go to the Emergency Department if any problems should occur. I do not have any questions at this time, but understand that I may call the clinic during office hours   should I have any questions or need assistance in obtaining follow up care.    __________________________________________  _____________  __________ Signature of Patient or Authorized Representative            Date                   Time    __________________________________________ Nurse's Signature

## 2012-12-07 ENCOUNTER — Ambulatory Visit (HOSPITAL_BASED_OUTPATIENT_CLINIC_OR_DEPARTMENT_OTHER): Payer: BC Managed Care – PPO

## 2012-12-07 VITALS — BP 108/67 | HR 93 | Temp 97.5°F

## 2012-12-07 DIAGNOSIS — C211 Malignant neoplasm of anal canal: Secondary | ICD-10-CM

## 2012-12-07 DIAGNOSIS — Z5189 Encounter for other specified aftercare: Secondary | ICD-10-CM

## 2012-12-07 MED ORDER — PEGFILGRASTIM INJECTION 6 MG/0.6ML
6.0000 mg | Freq: Once | SUBCUTANEOUS | Status: AC
  Administered 2012-12-07: 6 mg via SUBCUTANEOUS

## 2012-12-20 ENCOUNTER — Telehealth: Payer: Self-pay | Admitting: *Deleted

## 2012-12-20 NOTE — Telephone Encounter (Signed)
I have called and left the patient a message to call me back. Need to move injection appt for 4/25 due to a meeting.  JMW

## 2012-12-22 ENCOUNTER — Other Ambulatory Visit: Payer: Self-pay | Admitting: Oncology

## 2012-12-23 ENCOUNTER — Telehealth: Payer: Self-pay | Admitting: *Deleted

## 2012-12-23 NOTE — Telephone Encounter (Signed)
Patient's wife returned my call. I have moved appt for 4/25.  JMW

## 2012-12-26 ENCOUNTER — Telehealth: Payer: Self-pay | Admitting: Oncology

## 2012-12-26 ENCOUNTER — Ambulatory Visit: Payer: BC Managed Care – PPO

## 2012-12-26 ENCOUNTER — Ambulatory Visit (HOSPITAL_BASED_OUTPATIENT_CLINIC_OR_DEPARTMENT_OTHER): Payer: BC Managed Care – PPO | Admitting: Oncology

## 2012-12-26 ENCOUNTER — Telehealth: Payer: Self-pay | Admitting: *Deleted

## 2012-12-26 ENCOUNTER — Other Ambulatory Visit (HOSPITAL_BASED_OUTPATIENT_CLINIC_OR_DEPARTMENT_OTHER): Payer: BC Managed Care – PPO | Admitting: Lab

## 2012-12-26 VITALS — BP 108/60 | HR 64 | Temp 98.1°F | Resp 20 | Ht 71.0 in | Wt 219.5 lb

## 2012-12-26 DIAGNOSIS — C218 Malignant neoplasm of overlapping sites of rectum, anus and anal canal: Secondary | ICD-10-CM

## 2012-12-26 DIAGNOSIS — C211 Malignant neoplasm of anal canal: Secondary | ICD-10-CM

## 2012-12-26 DIAGNOSIS — B977 Papillomavirus as the cause of diseases classified elsewhere: Secondary | ICD-10-CM

## 2012-12-26 DIAGNOSIS — D696 Thrombocytopenia, unspecified: Secondary | ICD-10-CM

## 2012-12-26 DIAGNOSIS — G609 Hereditary and idiopathic neuropathy, unspecified: Secondary | ICD-10-CM

## 2012-12-26 LAB — CBC WITH DIFFERENTIAL/PLATELET
BASO%: 0.9 % (ref 0.0–2.0)
Basophils Absolute: 0 10*3/uL (ref 0.0–0.1)
EOS%: 1.8 % (ref 0.0–7.0)
HCT: 33.3 % — ABNORMAL LOW (ref 38.4–49.9)
HGB: 11 g/dL — ABNORMAL LOW (ref 13.0–17.1)
MCH: 33.3 pg (ref 27.2–33.4)
MCHC: 33 g/dL (ref 32.0–36.0)
MCV: 100.9 fL — ABNORMAL HIGH (ref 79.3–98.0)
MONO%: 22.1 % — ABNORMAL HIGH (ref 0.0–14.0)
NEUT%: 65.1 % (ref 39.0–75.0)
RDW: 14.5 % (ref 11.0–14.6)

## 2012-12-26 NOTE — Telephone Encounter (Signed)
gv adn printed appt sched and avs for pt...emailed michelle to add tx...  pt aware

## 2012-12-26 NOTE — Progress Notes (Signed)
    [facility]    OFFICE PROGRESS NOTE   INTERVAL HISTORY:   He returns as scheduled. He completed another cycle of Taxol/carboplatin on 12/06/2012. He had joint pain after the Neulasta for 2 days. The pain was relieved with hydrocodone. He has neuropathy symptoms in the hands and feet that predating chemotherapy. The foot neuropathy has been more painful recently and he began a trial of Lyrica with improvement. No other complaint.  Objective:  Vital signs in last 24 hours:  Blood pressure 108/60, pulse 64, temperature 98.1 F (36.7 C), temperature source Oral, resp. rate 20, height 5\' 11"  (1.803 m), weight 219 lb 8 oz (99.565 kg).    HEENT: No thrush or ulcers Lymphatics: No cervical or supraclavicular nodes Resp: Lungs clear bilaterally Cardio: Regular rate and rhythm GI: No hepatosplenomegaly Vascular: No leg edema Neuro: Mild decrease in vibratory sense at the fingers bilaterally      Lab Results:  Lab Results  Component Value Date   WBC 3.4* 12/26/2012   HGB 11.0* 12/26/2012   HCT 33.3* 12/26/2012   MCV 100.9* 12/26/2012   PLT 80* 12/26/2012   ANC 2.2    Medications: I have reviewed the patient's current medications.  Assessment/Plan: 1.Squamous cell carcinoma of the anal canal, HPV positive.  2. Staging CT/PET scan with tiny lung nodules, hypermetabolic retroperitoneal, right inguinal, and left supraclavicular nodes . FNA biopsy of a left supraclavicular lymph node on 05/03/2012 confirmed metastatic squamous cell carcinoma. -Cycle 1 of 5-fluorouracil/mitomycin-C and concurrent radiation on 05/20/2012. Cycle 2 of 5-fluorouracil/mitomycin-C on 06/24/2012 with completion of radiation on 06/28/2012. -Restaging PET scan 08/02/2012 with resolution of the hypermetabolic anal primary and right inguinal lymph node. Persistent hypermetabolic supraclavicular and retroperitoneal nodes. -cycle 1 of salvage Taxol/carboplatin chemotherapy on 08/30/2012 . Restaging CTs  after 4 cycles of Taxol/carboplatin confirmed resolution of retroperitoneal lymphadenopathy and a decrease in the size of a previously noted left supraclavicular node.  3. Hypermetabolic activity at the right supraglottic larynx on the PET scan 04/17/2012 with associated soft tissue fullness status post a negative ENT evaluation by Dr. Pollyann Kennedy.  4. History of aortic stenosis-status post aortic valve replacement and coronary artery bypass surgery in November 2012.  5. Mucositis following cycle 1 chemotherapy. Resolved.  6. Skin breakdown at the perineum and penis secondary to chemotherapy and radiation. Resolved.  7. 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 Bone pain after Neulasta  9. Generalized seizure 09/24/2012-negative brain CT,? Etiology. He has been evaluated by neurology and was prescribed Vimpat.  10.Thrombocytopenia secondary chemotherapy  11. Peripheral neuropathy-predating chemotherapy, he reports no significant change in the peripheral neuropathy symptoms since beginning chemotherapy aside from a recent increase in foot pain-relieved with Lyrica   Disposition:  He has completed 5 cycles of Taxol/carboplatin. Cycle 6 will be held today secondary to thrombocytopenia. He will return for the final planned cycle of chemotherapy on 01/02/2013.  Mr. Cass will return for an office visit in 2 months. He knows to contact us for spontaneous bleeding or bruising.   Thornton Papas, MD  12/26/2012  9:42 AM

## 2012-12-26 NOTE — Telephone Encounter (Addendum)
Left message on voicemail for pt to call office to clarify medication list. Wife returned call. Pt is still taking Vimpat. Will make MD aware.

## 2012-12-26 NOTE — Telephone Encounter (Signed)
Per staff message and POF I have scheduled appts.  JMW  

## 2012-12-27 ENCOUNTER — Ambulatory Visit: Payer: BC Managed Care – PPO

## 2012-12-30 IMAGING — CR DG CHEST 1V PORT
1 series · 1 of 1 positions shown · non-contrast
Comparison: 08/14/2011 and 07/11/2011.

CLINICAL DATA: Postop CABG.

PORTABLE CHEST - 1 VIEW

[view not recorded]
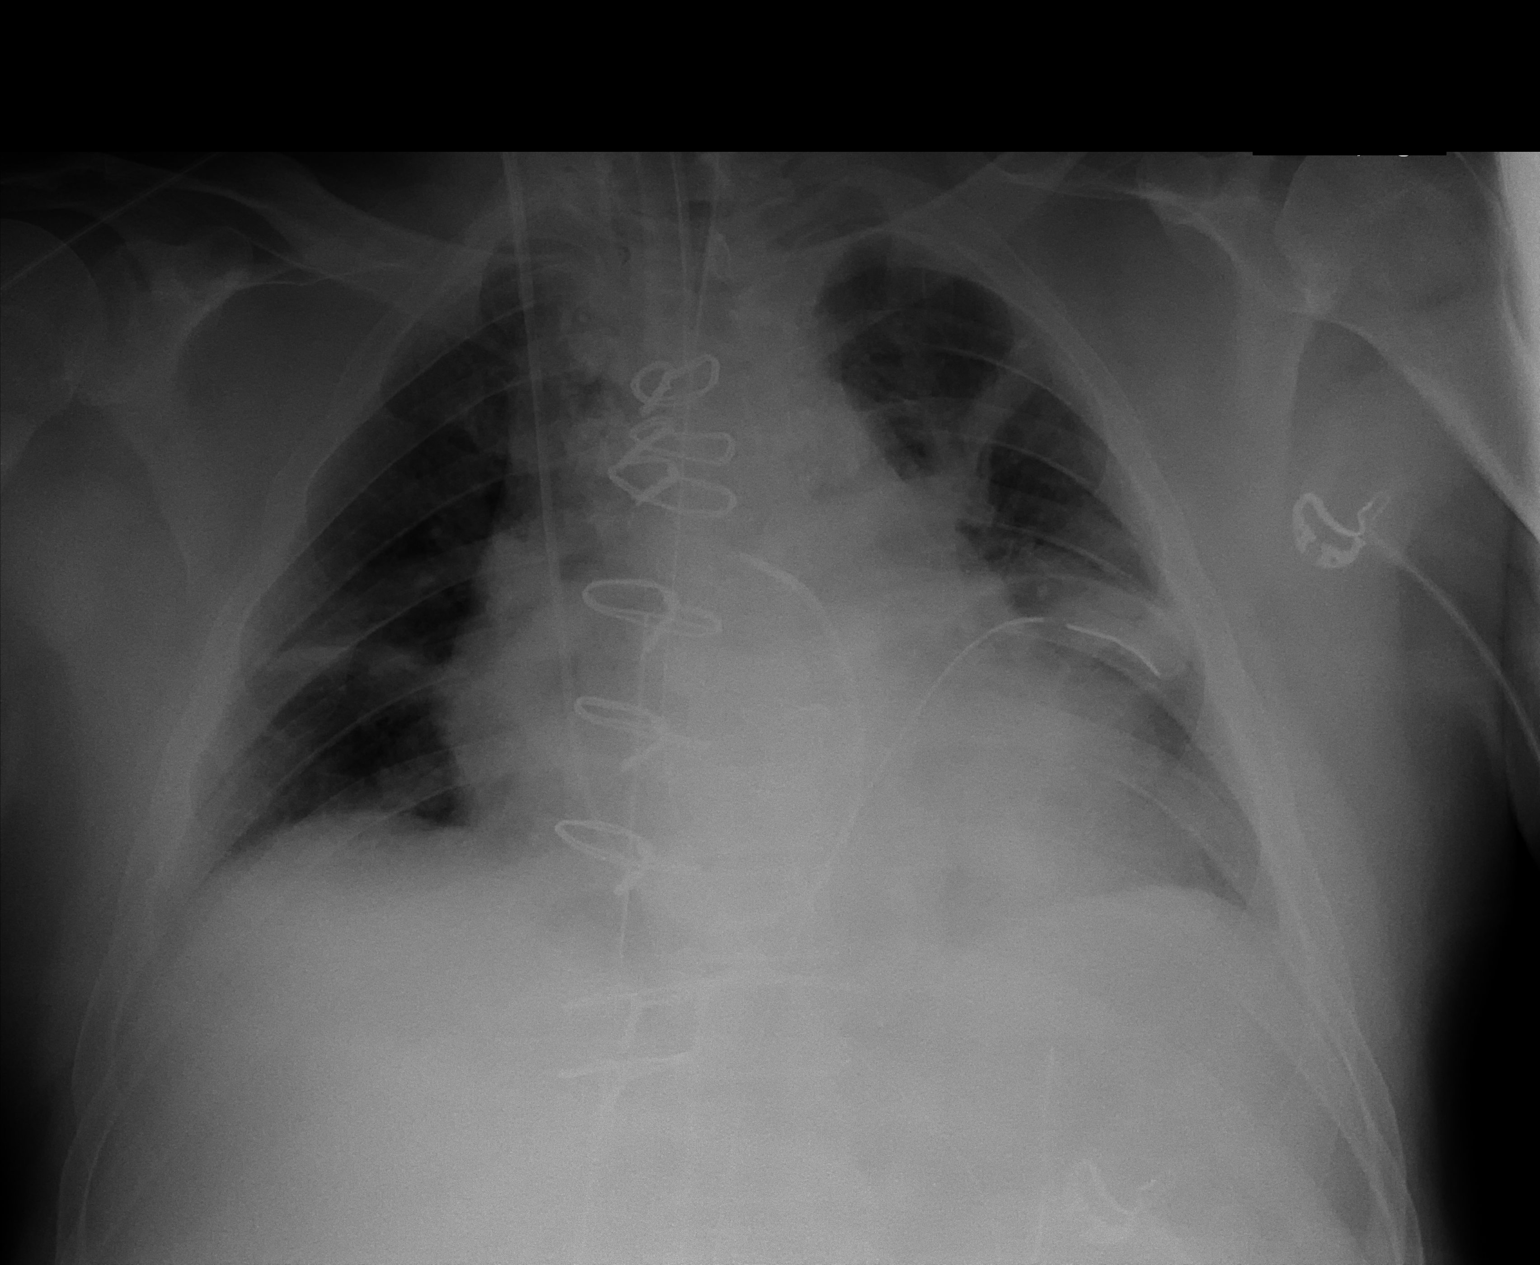

[1 of 1 positions shown; findings below may reference images not displayed]

FINDINGS: 9298 hours.  Endotracheal tube tip is in the mid trachea.
A nasogastric tube projects below the diaphragm.  There is a right
IJ Swan-Ganz catheter with its tip in the main pulmonary artery.
Mediastinal drain and left chest tube are in place.  There is mild
perihilar atelectasis bilaterally.  No pneumothorax or significant
pleural effusion is demonstrated.  The heart size appears stable
status post interval cardiac surgery.
IMPRESSION: No demonstrated complication following CABG.  Support system
positioned as above.

## 2013-01-01 IMAGING — CR DG CHEST 1V PORT
1 series · 1 of 1 positions shown · non-contrast
Comparison: August 17, 2011

CLINICAL DATA: Postop heart surgery

PORTABLE CHEST - 1 VIEW

[view not recorded]
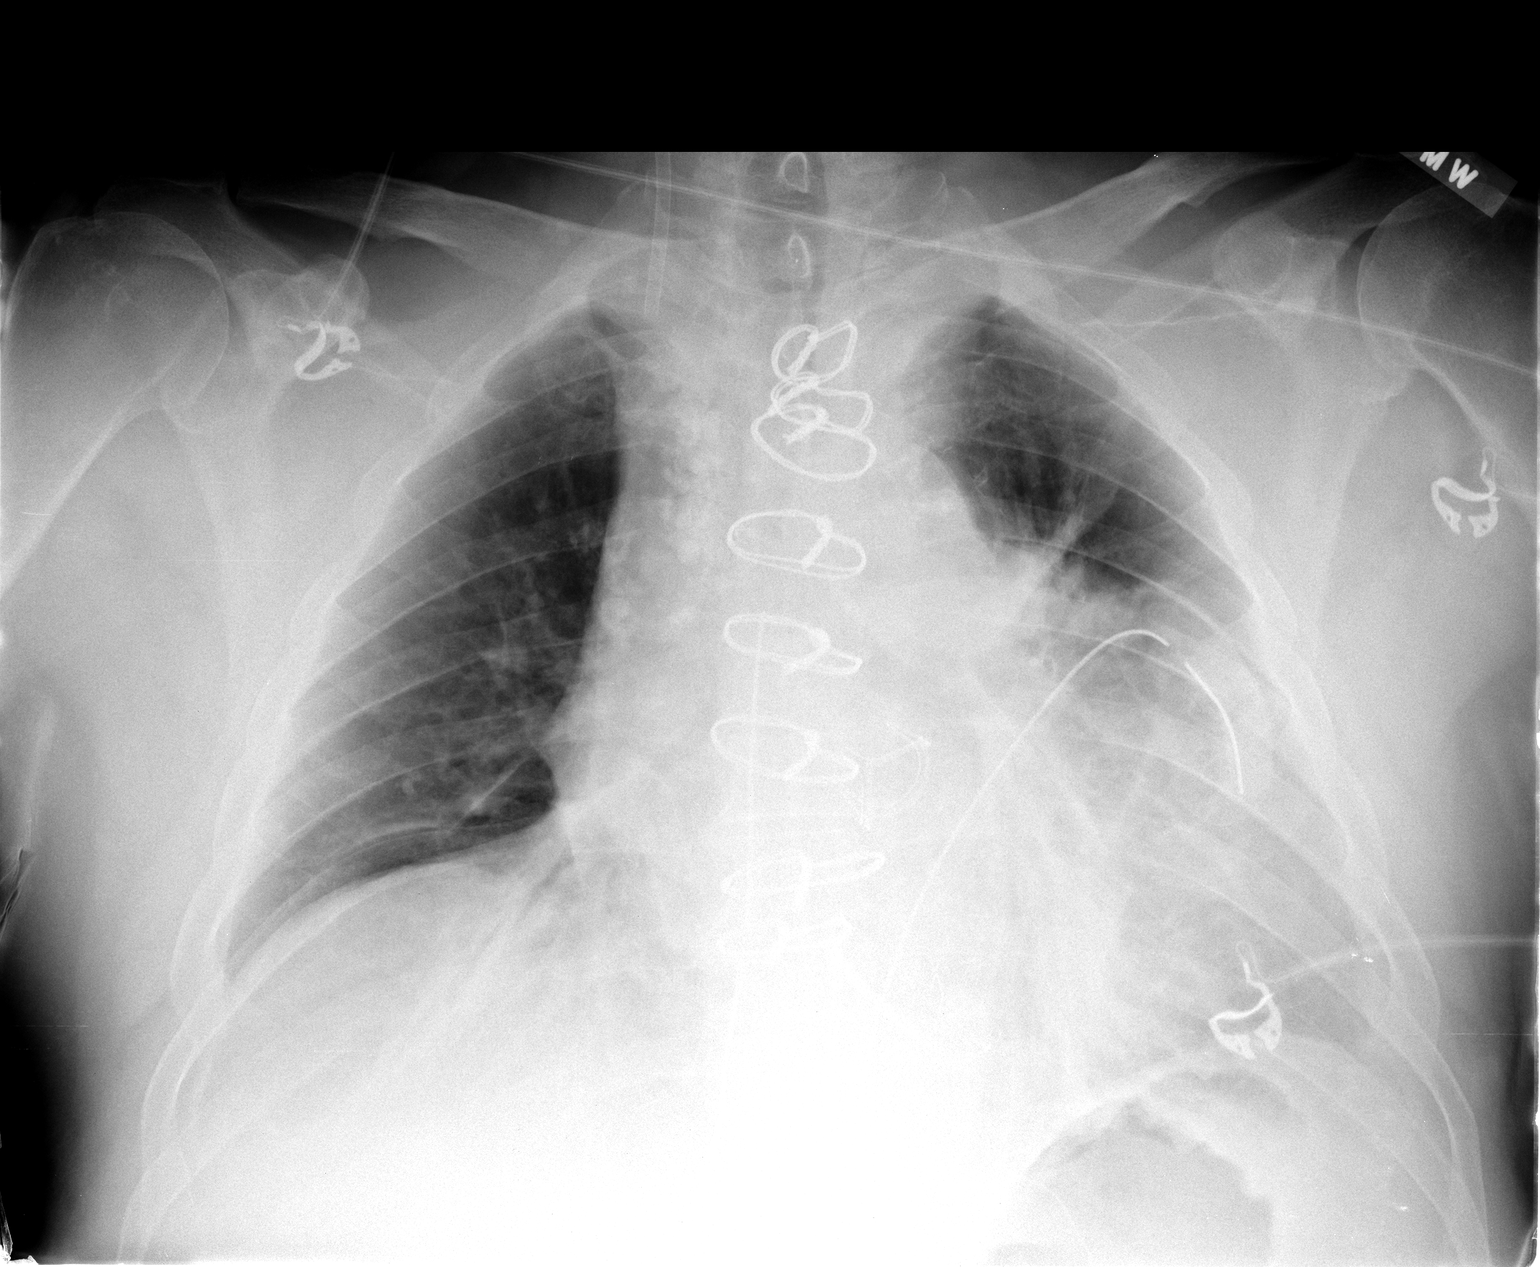

[1 of 1 positions shown; findings below may reference images not displayed]

FINDINGS: A right IJ sheath remains present with the tip at the
right brachiocephalic/SVC junction. The Swan-Ganz catheter has been
removed.  Mediastinal and left chest tubes are unchanged in
position.  No pneumothorax.  Atelectasis and small effusion on the
left do not appear significantly changed.
IMPRESSION: No significant change in atelectasis on the left with small
effusion.

## 2013-01-02 ENCOUNTER — Ambulatory Visit (HOSPITAL_BASED_OUTPATIENT_CLINIC_OR_DEPARTMENT_OTHER): Payer: Medicare Other

## 2013-01-02 ENCOUNTER — Other Ambulatory Visit (HOSPITAL_BASED_OUTPATIENT_CLINIC_OR_DEPARTMENT_OTHER): Payer: Medicare Other | Admitting: Lab

## 2013-01-02 ENCOUNTER — Other Ambulatory Visit: Payer: Self-pay | Admitting: Oncology

## 2013-01-02 VITALS — BP 112/63 | HR 75 | Temp 97.0°F | Resp 18

## 2013-01-02 DIAGNOSIS — Z5111 Encounter for antineoplastic chemotherapy: Secondary | ICD-10-CM | POA: Diagnosis not present

## 2013-01-02 DIAGNOSIS — C778 Secondary and unspecified malignant neoplasm of lymph nodes of multiple regions: Secondary | ICD-10-CM | POA: Diagnosis not present

## 2013-01-02 DIAGNOSIS — C211 Malignant neoplasm of anal canal: Secondary | ICD-10-CM | POA: Diagnosis not present

## 2013-01-02 LAB — CBC WITH DIFFERENTIAL/PLATELET
BASO%: 0.3 % (ref 0.0–2.0)
EOS%: 3.5 % (ref 0.0–7.0)
LYMPH%: 11.1 % — ABNORMAL LOW (ref 14.0–49.0)
MCHC: 32.1 g/dL (ref 32.0–36.0)
MCV: 102.1 fL — ABNORMAL HIGH (ref 79.3–98.0)
MONO%: 12.6 % (ref 0.0–14.0)
Platelets: 87 10*3/uL — ABNORMAL LOW (ref 140–400)
RBC: 3.36 10*6/uL — ABNORMAL LOW (ref 4.20–5.82)
nRBC: 0 % (ref 0–0)

## 2013-01-02 MED ORDER — ONDANSETRON 16 MG/50ML IVPB (CHCC)
16.0000 mg | Freq: Once | INTRAVENOUS | Status: AC
  Administered 2013-01-02: 16 mg via INTRAVENOUS

## 2013-01-02 MED ORDER — DEXAMETHASONE SODIUM PHOSPHATE 20 MG/5ML IJ SOLN
20.0000 mg | Freq: Once | INTRAMUSCULAR | Status: AC
  Administered 2013-01-02: 20 mg via INTRAVENOUS
  Filled 2013-01-02: qty 5

## 2013-01-02 MED ORDER — PACLITAXEL CHEMO INJECTION 300 MG/50ML
175.0000 mg/m2 | Freq: Once | INTRAVENOUS | Status: AC
  Administered 2013-01-02: 384 mg via INTRAVENOUS
  Filled 2013-01-02: qty 64

## 2013-01-02 MED ORDER — SODIUM CHLORIDE 0.9 % IV SOLN
Freq: Once | INTRAVENOUS | Status: AC
  Administered 2013-01-02: 12:00:00 via INTRAVENOUS

## 2013-01-02 MED ORDER — SODIUM CHLORIDE 0.9 % IV SOLN
410.0000 mg | Freq: Once | INTRAVENOUS | Status: AC
  Administered 2013-01-02: 410 mg via INTRAVENOUS
  Filled 2013-01-02: qty 41

## 2013-01-02 MED ORDER — FAMOTIDINE IN NACL 20-0.9 MG/50ML-% IV SOLN
20.0000 mg | Freq: Once | INTRAVENOUS | Status: AC
  Administered 2013-01-02: 20 mg via INTRAVENOUS

## 2013-01-02 MED ORDER — DIPHENHYDRAMINE HCL 50 MG/ML IJ SOLN
50.0000 mg | Freq: Once | INTRAMUSCULAR | Status: AC
  Administered 2013-01-02: 50 mg via INTRAVENOUS

## 2013-01-02 NOTE — Patient Instructions (Addendum)
[facility] Discharge Instructions for Patients Receiving Chemotherapy  Today you received the following chemotherapy agents Taxol and Carboplatin.  To help prevent nausea and vomiting after your treatment, we encourage you to take your nausea medication as prescribed. .   If you develop nausea and vomiting that is not controlled by your nausea medication, call the clinic. If it is after clinic hours your family physician or the after hours number for the clinic or go to the Emergency Department.   BELOW ARE SYMPTOMS THAT SHOULD BE REPORTED IMMEDIATELY:  *FEVER GREATER THAN 100.5 F  *CHILLS WITH OR WITHOUT FEVER  NAUSEA AND VOMITING THAT IS NOT CONTROLLED WITH YOUR NAUSEA MEDICATION  *UNUSUAL SHORTNESS OF BREATH  *UNUSUAL BRUISING OR BLEEDING  TENDERNESS IN MOUTH AND THROAT WITH OR WITHOUT PRESENCE OF ULCERS  *URINARY PROBLEMS  *BOWEL PROBLEMS  UNUSUAL RASH Items with * indicate a potential emergency and should be followed up as soon as possible.  Please let the nurse know about any problems that you may have experienced. Feel free to call the clinic you have any questions or concerns. The clinic phone number is [phone].   I have been informed and understand all the instructions given to me. I know to contact the clinic, my physician, or go to the Emergency Department if any problems should occur. I do not have any questions at this time, but understand that I may call the clinic during office hours   should I have any questions or need assistance in obtaining follow up care.    __________________________________________  _____________  __________ Signature of Patient or Authorized Representative            Date                   Time    __________________________________________ Nurse's Signature    

## 2013-01-02 NOTE — Progress Notes (Signed)
Per Dr. Truett Perna ok to treat despite platelet count of 87.  Pt to come back on May 12th for CBC.

## 2013-01-03 ENCOUNTER — Ambulatory Visit (HOSPITAL_BASED_OUTPATIENT_CLINIC_OR_DEPARTMENT_OTHER): Payer: Medicare Other

## 2013-01-03 ENCOUNTER — Telehealth: Payer: Self-pay | Admitting: Oncology

## 2013-01-03 VITALS — BP 159/82 | HR 91 | Temp 98.4°F

## 2013-01-03 DIAGNOSIS — C211 Malignant neoplasm of anal canal: Secondary | ICD-10-CM

## 2013-01-03 MED ORDER — PEGFILGRASTIM INJECTION 6 MG/0.6ML
6.0000 mg | Freq: Once | SUBCUTANEOUS | Status: AC
  Administered 2013-01-03: 6 mg via SUBCUTANEOUS
  Filled 2013-01-03: qty 0.6

## 2013-01-03 NOTE — Telephone Encounter (Signed)
s.w. pt and advised on 5.12.14 lab appt...Marland KitchenMarland KitchenMarland Kitchenpt ok and aware

## 2013-01-07 ENCOUNTER — Telehealth: Payer: Self-pay | Admitting: *Deleted

## 2013-01-07 NOTE — Telephone Encounter (Signed)
Reporting intermittent sharp left abdominal pain since Saturday. Each episode can last about 20 seconds and is "moderate" in intensity. No fever or nausea associated with the pain. Had several hard stools Friday and some bright red blood in stool that day. Had diarrhea couple days this week, but resolved now. Has tried antacids, alka seltzer. Per Dr. Truett Perna: Posey Rea of exact cause- ? Muscle strain, neulasta, gas pain? Instructed him to continue antacids, try OTC Gas-X and take his pain med. Call or go to ER for fever or vomiting or severe increase in pain. Call office early am if not better and MD will try to work him in to be seen.

## 2013-01-13 ENCOUNTER — Other Ambulatory Visit (HOSPITAL_BASED_OUTPATIENT_CLINIC_OR_DEPARTMENT_OTHER): Payer: Medicare Other | Admitting: Lab

## 2013-01-13 DIAGNOSIS — C211 Malignant neoplasm of anal canal: Secondary | ICD-10-CM | POA: Diagnosis not present

## 2013-01-13 LAB — CBC WITH DIFFERENTIAL/PLATELET
BASO%: 0.4 % (ref 0.0–2.0)
EOS%: 0.4 % (ref 0.0–7.0)
MCH: 33.5 pg — ABNORMAL HIGH (ref 27.2–33.4)
MCHC: 32.4 g/dL (ref 32.0–36.0)
NEUT%: 81 % — ABNORMAL HIGH (ref 39.0–75.0)
RBC: 3.13 10*6/uL — ABNORMAL LOW (ref 4.20–5.82)
RDW: 15.2 % — ABNORMAL HIGH (ref 11.0–14.6)
lymph#: 0.6 10*3/uL — ABNORMAL LOW (ref 0.9–3.3)

## 2013-01-15 ENCOUNTER — Telehealth: Payer: Self-pay | Admitting: Oncology

## 2013-01-15 ENCOUNTER — Telehealth: Payer: Self-pay | Admitting: *Deleted

## 2013-01-15 DIAGNOSIS — C218 Malignant neoplasm of overlapping sites of rectum, anus and anal canal: Secondary | ICD-10-CM

## 2013-01-15 NOTE — Telephone Encounter (Signed)
Message copied by Caleb Popp on Wed Jan 15, 2013  3:38 PM ------      Message from: [name]      Created: Tue Jan 14, 2013 10:10 PM       Please call patient, platelets are ok, check cbc approx. 10days ------

## 2013-01-15 NOTE — Telephone Encounter (Signed)
Left message on voicemail for pt to call office. Will need lab appt 5/12.

## 2013-01-16 DIAGNOSIS — G609 Hereditary and idiopathic neuropathy, unspecified: Secondary | ICD-10-CM | POA: Diagnosis not present

## 2013-01-23 ENCOUNTER — Other Ambulatory Visit (HOSPITAL_BASED_OUTPATIENT_CLINIC_OR_DEPARTMENT_OTHER): Payer: Medicare Other

## 2013-01-23 DIAGNOSIS — C211 Malignant neoplasm of anal canal: Secondary | ICD-10-CM

## 2013-01-23 DIAGNOSIS — C218 Malignant neoplasm of overlapping sites of rectum, anus and anal canal: Secondary | ICD-10-CM

## 2013-01-23 LAB — CBC WITH DIFFERENTIAL/PLATELET
BASO%: 0.6 % (ref 0.0–2.0)
Basophils Absolute: 0 10e3/uL (ref 0.0–0.1)
EOS%: 2.4 % (ref 0.0–7.0)
Eosinophils Absolute: 0.1 10e3/uL (ref 0.0–0.5)
HCT: 31.8 % — ABNORMAL LOW (ref 38.4–49.9)
HGB: 10.3 g/dL — ABNORMAL LOW (ref 13.0–17.1)
LYMPH%: 13.5 % — ABNORMAL LOW (ref 14.0–49.0)
MCH: 33.4 pg (ref 27.2–33.4)
MCHC: 32.4 g/dL (ref 32.0–36.0)
MCV: 103.2 fL — ABNORMAL HIGH (ref 79.3–98.0)
MONO#: 0.6 10e3/uL (ref 0.1–0.9)
MONO%: 16.2 % — ABNORMAL HIGH (ref 0.0–14.0)
NEUT#: 2.3 10e3/uL (ref 1.5–6.5)
NEUT%: 67.3 % (ref 39.0–75.0)
Platelets: 81 10e3/uL — ABNORMAL LOW (ref 140–400)
RBC: 3.08 10e6/uL — ABNORMAL LOW (ref 4.20–5.82)
RDW: 15.6 % — ABNORMAL HIGH (ref 11.0–14.6)
WBC: 3.4 10e3/uL — ABNORMAL LOW (ref 4.0–10.3)
lymph#: 0.5 10e3/uL — ABNORMAL LOW (ref 0.9–3.3)
nRBC: 0 % (ref 0–0)

## 2013-02-05 DIAGNOSIS — Z954 Presence of other heart-valve replacement: Secondary | ICD-10-CM | POA: Diagnosis not present

## 2013-02-05 DIAGNOSIS — Z951 Presence of aortocoronary bypass graft: Secondary | ICD-10-CM | POA: Diagnosis not present

## 2013-02-05 DIAGNOSIS — I10 Essential (primary) hypertension: Secondary | ICD-10-CM | POA: Diagnosis not present

## 2013-02-05 DIAGNOSIS — I2510 Atherosclerotic heart disease of native coronary artery without angina pectoris: Secondary | ICD-10-CM | POA: Diagnosis not present

## 2013-02-25 ENCOUNTER — Other Ambulatory Visit (HOSPITAL_BASED_OUTPATIENT_CLINIC_OR_DEPARTMENT_OTHER): Payer: BLUE CROSS/BLUE SHIELD | Admitting: Lab

## 2013-02-25 ENCOUNTER — Ambulatory Visit (HOSPITAL_BASED_OUTPATIENT_CLINIC_OR_DEPARTMENT_OTHER): Payer: Medicare Other | Admitting: Oncology

## 2013-02-25 ENCOUNTER — Telehealth: Payer: Self-pay | Admitting: Oncology

## 2013-02-25 VITALS — BP 144/67 | HR 67 | Temp 97.8°F | Resp 20 | Ht 71.0 in | Wt 224.4 lb

## 2013-02-25 DIAGNOSIS — C211 Malignant neoplasm of anal canal: Secondary | ICD-10-CM

## 2013-02-25 LAB — CBC WITH DIFFERENTIAL/PLATELET
BASO%: 0.6 % (ref 0.0–2.0)
HCT: 34.5 % — ABNORMAL LOW (ref 38.4–49.9)
MCHC: 34 g/dL (ref 32.0–36.0)
MONO#: 0.8 10*3/uL (ref 0.1–0.9)
RBC: 3.42 10*6/uL — ABNORMAL LOW (ref 4.20–5.82)
RDW: 15.5 % — ABNORMAL HIGH (ref 11.0–14.6)
WBC: 5 10*3/uL (ref 4.0–10.3)
lymph#: 0.4 10*3/uL — ABNORMAL LOW (ref 0.9–3.3)

## 2013-02-25 NOTE — Progress Notes (Signed)
   [facility]    OFFICE PROGRESS NOTE   INTERVAL HISTORY:   He returns as scheduled. He completed cycle 6 of Taxol and carboplatin on 01/02/2013. He feels well. He is working. No specific complaint.  Objective:  Vital signs in last 24 hours:  Blood pressure 144/67, pulse 67, temperature 97.8 F (36.6 C), temperature source Oral, resp. rate 20, height 5\' 11"  (1.803 m), weight 224 lb 6.4 oz (101.787 kg).    HEENT: Neck without mass Lymphatics: No cervical, supraclavicular, axillary, or inguinal nodes Resp: Lungs clear bilaterally Cardio: Regular rate and rhythm GI: No hepatomegaly, nontender, no mass. Rectal-normal tone, slight irregularity at the anterior anal canal without nodularity or a discrete mass Vascular: No leg edema   Lab Results:  Lab Results  Component Value Date   WBC 5.0 02/25/2013   HGB 11.7* 02/25/2013   HCT 34.5* 02/25/2013   MCV 100.8* 02/25/2013   PLT 126* 02/25/2013   ANC 3.4    Medications: I have reviewed the patient's current medications.  Assessment/Plan: 1.Squamous cell carcinoma of the anal canal, HPV positive.  2. Staging CT/PET scan with tiny lung nodules, hypermetabolic retroperitoneal, right inguinal, and left supraclavicular nodes . FNA biopsy of a left supraclavicular lymph node on 05/03/2012 confirmed metastatic squamous cell carcinoma. -Cycle 1 of 5-fluorouracil/mitomycin-C and concurrent radiation on 05/20/2012. Cycle 2 of 5-fluorouracil/mitomycin-C on 06/24/2012 with completion of radiation on 06/28/2012. -Restaging PET scan 08/02/2012 with resolution of the hypermetabolic anal primary and right inguinal lymph node. Persistent hypermetabolic supraclavicular and retroperitoneal nodes. -cycle 1 of salvage Taxol/carboplatin chemotherapy on 08/30/2012 . Restaging CTs after 4 cycles of Taxol/carboplatin confirmed resolution of retroperitoneal lymphadenopathy and a decrease in the size of a previously noted left supraclavicular node.  He completed cycle 6 of Taxol/carboplatin on 01/02/2013. 3. Hypermetabolic activity at the right supraglottic larynx on the PET scan 04/17/2012 with associated soft tissue fullness status post a negative ENT evaluation by Dr. Pollyann Kennedy.  4. History of aortic stenosis-status post aortic valve replacement and coronary artery bypass surgery in November 2012.  5. Mucositis following cycle 1 chemotherapy. Resolved.  6. Skin breakdown at the perineum and penis secondary to chemotherapy and radiation. Resolved.  7. 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 Bone pain after Neulasta  9. Generalized seizure 09/24/2012-negative brain CT,? Etiology. He has been evaluated by neurology and was prescribed Vimpat.  10.Thrombocytopenia secondary chemotherapy -improved 11. Peripheral neuropathy-predating chemotherapy    Disposition:  Mr. Boomershine has completed the planned course of systemic chemotherapy. No clinical evidence for progression of the anal cancer. He will return for an office visit in 3 months. He reports being scheduled for a followup in neurology to discuss the indication for continuing the Vimpat.   Thornton Papas, MD  02/25/2013  3:19 PM

## 2013-03-13 ENCOUNTER — Encounter: Payer: Self-pay | Admitting: Gastroenterology

## 2013-03-18 ENCOUNTER — Telehealth: Payer: Self-pay | Admitting: *Deleted

## 2013-03-18 NOTE — Telephone Encounter (Signed)
Notified wife that Dr. Truett Perna says it is reasonable to do a sigmoidoscopy, but full colonoscopy is probably not necessary. Best to discuss with Dr. Arlyce Dice. Wife will call Dr. Marzetta Board office.

## 2013-03-18 NOTE — Telephone Encounter (Signed)
Received letter from GI that he is due his 1 year follow up colonoscopy. Calling to inquire if Dr. Truett Perna approves to schedule this now?

## 2013-03-19 ENCOUNTER — Telehealth: Payer: Self-pay | Admitting: Gastroenterology

## 2013-03-20 NOTE — Telephone Encounter (Signed)
Wandalee Ferdinand, RN at 03/18/2013 5:20 PM   Status: Signed            Notified wife that Dr. Truett Perna says it is reasonable to do a sigmoidoscopy, but full colonoscopy is probably not necessary. Best to discuss with Dr. Arlyce Dice. Wife will call Dr. Marzetta Board office.        Wandalee Ferdinand, RN at 03/18/2013 9:31 AM    Status: Signed             Received letter from GI that he is due his 1 year follow up colonoscopy. Calling to inquire if Dr. Truett Perna approves to schedule this now?       Dr Arlyce Dice please advise on what to order for the COLON recall. Pt completed Radiation and last Chemo was on 01/02/13. Dr Truett Perna thinks only partial exam; I assume that would be a Flex Sig. Thanks.

## 2013-03-21 NOTE — Telephone Encounter (Signed)
Pt has a squamous cell Ca.  F. Sig should be adequate.

## 2013-03-21 NOTE — Telephone Encounter (Signed)
Informed wife Dr Arlyce Dice states a Flex Sig is OK for pt. Scheduled for PV on 03/25/13 and Flex will be on 03/31/13. Wife will call if not OK with pt.

## 2013-03-24 ENCOUNTER — Telehealth: Payer: Self-pay | Admitting: Gastroenterology

## 2013-03-24 NOTE — Telephone Encounter (Signed)
Spoke with pts wife and let her know the blood thinners that they need to stop are the prescription meds, aspirin should be ok to continue.

## 2013-03-25 ENCOUNTER — Ambulatory Visit (AMBULATORY_SURGERY_CENTER): Payer: Medicare Other | Admitting: *Deleted

## 2013-03-25 VITALS — Ht 71.0 in | Wt 225.4 lb

## 2013-03-25 DIAGNOSIS — Z85038 Personal history of other malignant neoplasm of large intestine: Secondary | ICD-10-CM

## 2013-03-25 NOTE — Progress Notes (Signed)
No egg or soy allergy. ewm No problems with past sedation. ewm No home 02 use. ewm Previous colon with kaplan. ewm

## 2013-03-31 ENCOUNTER — Other Ambulatory Visit: Payer: Medicare Other | Admitting: Gastroenterology

## 2013-04-01 ENCOUNTER — Ambulatory Visit (AMBULATORY_SURGERY_CENTER): Payer: Medicare Other | Admitting: Gastroenterology

## 2013-04-01 ENCOUNTER — Encounter: Payer: Self-pay | Admitting: Gastroenterology

## 2013-04-01 VITALS — BP 114/59 | HR 56 | Temp 97.5°F | Resp 20 | Ht 71.0 in | Wt 225.0 lb

## 2013-04-01 DIAGNOSIS — I4891 Unspecified atrial fibrillation: Secondary | ICD-10-CM | POA: Diagnosis not present

## 2013-04-01 DIAGNOSIS — I359 Nonrheumatic aortic valve disorder, unspecified: Secondary | ICD-10-CM | POA: Diagnosis not present

## 2013-04-01 DIAGNOSIS — Z85038 Personal history of other malignant neoplasm of large intestine: Secondary | ICD-10-CM

## 2013-04-01 DIAGNOSIS — Z85048 Personal history of other malignant neoplasm of rectum, rectosigmoid junction, and anus: Secondary | ICD-10-CM | POA: Diagnosis not present

## 2013-04-01 DIAGNOSIS — I2510 Atherosclerotic heart disease of native coronary artery without angina pectoris: Secondary | ICD-10-CM | POA: Diagnosis not present

## 2013-04-01 DIAGNOSIS — R569 Unspecified convulsions: Secondary | ICD-10-CM | POA: Diagnosis not present

## 2013-04-01 DIAGNOSIS — E119 Type 2 diabetes mellitus without complications: Secondary | ICD-10-CM | POA: Diagnosis not present

## 2013-04-01 LAB — GLUCOSE, CAPILLARY: Glucose-Capillary: 100 mg/dL — ABNORMAL HIGH (ref 70–99)

## 2013-04-01 MED ORDER — SODIUM CHLORIDE 0.9 % IV SOLN: 500.0000 mL | INTRAVENOUS | Status: DC

## 2013-04-01 NOTE — Op Note (Signed)
[facility] [address] [place], [address]   FLEXIBLE SIGMOIDOSCOPY PROCEDURE REPORT  PATIENT: Kirk [name], Kirk [name]  MR#: [id number] BIRTHDATE: Jan 02, 1946 , 67  yrs. old GENDER: Male ENDOSCOPIST: Louis Meckel, MD REFERRED BY: Mardelle Matte, M.D.  Elias Else, M.D. PROCEDURE DATE:  04/01/2013 PROCEDURE:   Sigmoidoscopy, diagnostic ASA CLASS:   Class II INDICATIONS:follow up for previously diagnosed rectal cancer. (squamous cell Ca) MEDICATIONS: MAC sedation, administered by CRNA and propofol (Diprivan) 100mg  IV  DESCRIPTION OF PROCEDURE:   After the risks benefits and alternatives of the procedure were thoroughly explained, informed consent was obtained.  revealed no abnormalities of the rectum. The LB [id number]-[id number]  endoscope was introduced through the anus  and advanced to the descending colon , limited by No adverse events experienced.   The quality of the prep was    .  The instrument was then slowly withdrawn as the mucosa was fully examined.       1.  COLON FINDINGS: There was minimal friability and erythema in the rectal vault and anal canal. There was no evidence of tumor.  Exam was otherwise normal. I was unable to retroflexed in the rectal vault. The scope was then withdrawn from the patient and the procedure terminated.  COMPLICATIONS: There were no complications.  ENDOSCOPIC IMPRESSION: normal sigmoidoscopy (mild inflammatory changes in the rectum)  RECOMMENDATIONS: colonoscopy in 2 years  REPEAT EXAM:   _______________________________ eSignedLouis Meckel, MD 04/01/2013 11:39 AM   CC:

## 2013-04-01 NOTE — Patient Instructions (Addendum)
YOU HAD AN ENDOSCOPIC PROCEDURE TODAY AT THE [facility]: Refer to the procedure report that was given to you for any specific questions about what was found during the examination.  If the procedure report does not answer your questions, please call your gastroenterologist to clarify.  If you requested that your care partner not be given the details of your procedure findings, then the procedure report has been included in a sealed envelope for you to review at your convenience later.  YOU SHOULD EXPECT: Some feelings of bloating in the abdomen. Passage of more gas than usual.  Walking can help get rid of the air that was put into your GI tract during the procedure and reduce the bloating. If you had a lower endoscopy (such as a colonoscopy or flexible sigmoidoscopy) you may notice spotting of blood in your stool or on the toilet paper. If you underwent a bowel prep for your procedure, then you may not have a normal bowel movement for a few days.  DIET: Your first meal following the procedure should be a light meal and then it is ok to progress to your normal diet.  A half-sandwich or bowl of soup is an example of a good first meal.  Heavy or fried foods are harder to digest and may make you feel nauseous or bloated.  Likewise meals heavy in dairy and vegetables can cause extra gas to form and this can also increase the bloating.  Drink plenty of fluids but you should avoid alcoholic beverages for 24 hours.  ACTIVITY: Your care partner should take you home directly after the procedure.  You should plan to take it easy, moving slowly for the rest of the day.  You can resume normal activity the day after the procedure however you should NOT DRIVE or use heavy machinery for 24 hours (because of the sedation medicines used during the test).    SYMPTOMS TO REPORT IMMEDIATELY: A gastroenterologist can be reached at any hour.  During normal business hours, 8:30 AM to 5:00 PM Monday through Friday,  call [phone].  After hours and on weekends, please call the GI answering service at [phone] who will take a message and have the physician on call contact you.   Following lower endoscopy (colonoscopy or flexible sigmoidoscopy):  Excessive amounts of blood in the stool  Significant tenderness or worsening of abdominal pains  Swelling of the abdomen that is new, acute  Fever of 100F or higher   FOLLOW UP: If any biopsies were taken you will be contacted by phone or by letter within the next 1-3 weeks.  Call your gastroenterologist if you have not heard about the biopsies in 3 weeks.  Our staff will call the home number listed on your records the next business day following your procedure to check on you and address any questions or concerns that you may have at that time regarding the information given to you following your procedure. This is a courtesy call and so if there is no answer at the home number and we have not heard from you through the emergency physician on call, we will assume that you have returned to your regular daily activities without incident.  SIGNATURES/CONFIDENTIALITY: You and/or your care partner have signed paperwork which will be entered into your electronic medical record.  These signatures attest to the fact that that the information above on your After Visit Summary has been reviewed and is understood.  Full responsibility of the confidentiality of   this discharge information lies with you and/or your care-partner.    Repeat colonoscopy in 2 years with Dr. Arlyce Dice. You may resume your current medications today. Please call if any questions or concerns.

## 2013-04-01 NOTE — Progress Notes (Signed)
Patient did not experience any of the following events: a burn prior to discharge; a fall within the facility; wrong site/side/patient/procedure/implant event; or a hospital transfer or hospital admission upon discharge from the facility. (G8907) Patient did not have preoperative order for IV antibiotic SSI prophylaxis. (G8918)  

## 2013-04-01 NOTE — Progress Notes (Signed)
No complaints noted in the recovery room. Maw   

## 2013-04-01 NOTE — Progress Notes (Signed)
Lidocaine-40mg IV prior to Propofol InductionPropofol given over incremental dosages 

## 2013-04-02 ENCOUNTER — Telehealth: Payer: Self-pay | Admitting: *Deleted

## 2013-04-02 NOTE — Telephone Encounter (Signed)
  Follow up Call-  Call back number 04/01/2013 04/09/2012  Post procedure Call Back phone  # [phone]  Permission to leave phone message Yes Yes     Patient questions:  Do you have a fever, pain , or abdominal swelling? no Pain Score  0 *  Have you tolerated food without any problems? yes  Have you been able to return to your normal activities? yes  Do you have any questions about your discharge instructions: Diet   no Medications  no Follow up visit  no  Do you have questions or concerns about your Care? no  Actions: * If pain score is 4 or above: No action needed, pain <4.

## 2013-04-03 LAB — GLUCOSE, CAPILLARY: Glucose-Capillary: 116 mg/dL — ABNORMAL HIGH (ref 70–99)

## 2013-04-08 ENCOUNTER — Telehealth: Payer: Self-pay | Admitting: *Deleted

## 2013-04-08 NOTE — Telephone Encounter (Signed)
Message from wife asking if pt can have shingles vaccine. Note to Dr. Truett Perna.

## 2013-04-11 NOTE — Telephone Encounter (Signed)
Spoke to pt's wife, she verbalized understanding.  SLJ

## 2013-05-23 ENCOUNTER — Ambulatory Visit: Payer: BC Managed Care – PPO | Admitting: Nurse Practitioner

## 2013-05-27 ENCOUNTER — Telehealth: Payer: Self-pay | Admitting: Oncology

## 2013-05-27 ENCOUNTER — Ambulatory Visit (HOSPITAL_BASED_OUTPATIENT_CLINIC_OR_DEPARTMENT_OTHER): Payer: Medicare Other | Admitting: Oncology

## 2013-05-27 VITALS — BP 127/69 | HR 56 | Temp 97.1°F | Resp 18 | Ht 71.0 in | Wt 228.5 lb

## 2013-05-27 DIAGNOSIS — C211 Malignant neoplasm of anal canal: Secondary | ICD-10-CM

## 2013-05-27 NOTE — Progress Notes (Signed)
   [facility]    OFFICE PROGRESS NOTE   INTERVAL HISTORY:   He returns for scheduled visit. Occasional discomfort in the left groin. No difficulty with bowel or bladder function. Good energy level. He continues to work. He reports difficulty obtaining an erection despite Cialis.  Objective:  Vital signs in last 24 hours:  Blood pressure 127/69, pulse 56, temperature 97.1 F (36.2 C), temperature source Oral, resp. rate 18, height 5\' 11"  (1.803 m), weight 228 lb 8 oz (103.647 kg).    HEENT: Neck without mass Lymphatics: No cervical, supraclavicular, axillary, or inguinal nodes Resp: Lungs clear bilaterally Cardio: Regular rate and rhythm GI: No hepatomegaly, no mass, nontender, no hernia Vascular: No leg edema GU: Testes without mass      Medications: I have reviewed the patient's current medications.  Assessment/Plan: 1.Squamous cell carcinoma of the anal canal, HPV positive.  2. Staging CT/PET scan with tiny lung nodules, hypermetabolic retroperitoneal, right inguinal, and left supraclavicular nodes . FNA biopsy of a left supraclavicular lymph node on 05/03/2012 confirmed metastatic squamous cell carcinoma. -Cycle 1 of 5-fluorouracil/mitomycin-C and concurrent radiation on 05/20/2012. Cycle 2 of 5-fluorouracil/mitomycin-C on 06/24/2012 with completion of radiation on 06/28/2012. -Restaging PET scan 08/02/2012 with resolution of the hypermetabolic anal primary and right inguinal lymph node. Persistent hypermetabolic supraclavicular and retroperitoneal nodes. -cycle 1 of salvage Taxol/carboplatin chemotherapy on 08/30/2012 . Restaging CTs after 4 cycles of Taxol/carboplatin confirmed resolution of retroperitoneal lymphadenopathy and a decrease in the size of a previously noted left supraclavicular node. He completed cycle 6 of Taxol/carboplatin on 01/02/2013.  -A negative flexible sigmoidoscopy 04/01/2013 3. Hypermetabolic activity at the right supraglottic larynx on  the PET scan 04/17/2012 with associated soft tissue fullness status post a negative ENT evaluation by Dr. Pollyann Kennedy.  4. History of aortic stenosis-status post aortic valve replacement and coronary artery bypass surgery in November 2012.  5. Mucositis following cycle 1 chemotherapy. Resolved.  6. Skin breakdown at the perineum and penis secondary to chemotherapy and radiation. Resolved.  7. 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 Bone pain after Neulasta  9. Generalized seizure 09/24/2012-negative brain CT,? Etiology. He has been evaluated by neurology and was prescribed Vimpat.  10.Thrombocytopenia secondary chemotherapy -improved  11. Peripheral neuropathy-predating chemotherapy  12. Erectile dysfunction-managed by Dr.Reade, predated chemotherapy/radiation  Disposition:  He remains in clinical remission from anal cancer. He will see neurology in the near future to discuss the indication for continuing Vimpat. Mr. Oshields will return for an office visit in 4 months. We will consider obtaining a restaging CT evaluation in the spring of 2014. He will contact us in the interim for consistent discomfort at the left groin or new symptoms.   Thornton Papas, MD  05/27/2013  7:58 PM

## 2013-05-27 NOTE — Telephone Encounter (Signed)
gv and printed appt sched and avs for opt for OCT, Dec, Jan 2015

## 2013-06-05 ENCOUNTER — Ambulatory Visit
Admission: RE | Admit: 2013-06-05 | Discharge: 2013-06-05 | Disposition: A | Discharge status: Home / Self Care | Payer: Medicare Other | Source: Ambulatory Visit | Attending: Radiation Oncology | Admitting: Radiation Oncology

## 2013-06-05 ENCOUNTER — Encounter: Payer: Self-pay | Admitting: Radiation Oncology

## 2013-06-05 VITALS — BP 129/72 | HR 64 | Temp 98.3°F | Resp 20 | Wt 222.9 lb

## 2013-06-05 DIAGNOSIS — C211 Malignant neoplasm of anal canal: Secondary | ICD-10-CM | POA: Diagnosis not present

## 2013-06-05 NOTE — Progress Notes (Signed)
  Radiation Oncology         [phone] ________________________________  Name: Kirk [name] MRN: [id number]  Date: 06/05/2013  DOB: 12/08/1945  Follow-Up Visit Note  CC: [name], MD  [name], MD  Diagnosis:   Squamous cell carcinoma of the anal canal  Interval Since Last Radiation:  One year   Narrative:  The patient returns today for routine follow-up.  The patient is doing well overall. No discomfort in the anal/rectal region. He has regular bowel movements. A sigmoidoscopy in July was negative for concerning findings. He does have a good energy level.                              ALLERGIES:  is allergic to cat hair extract.  Meds: Current Outpatient Prescriptions  Medication Sig Dispense Refill  . amitriptyline (ELAVIL) 25 MG tablet Take 25 mg by mouth at bedtime.       Marland Kitchen aspirin EC 81 MG tablet Take 81 mg by mouth daily.      . ASTEPRO 0.15 % SOLN Place 2 sprays into both nostrils at bedtime.       Marland Kitchen atorvastatin (LIPITOR) 20 MG tablet Take 80 mg by mouth daily.       . Cholecalciferol 1000 UNITS tablet 185 mg. Take 185 mg by mouth daily.      Marland Kitchen lisinopril-hydrochlorothiazide (PRINZIDE,ZESTORETIC) 10-12.5 MG per tablet Take 1 tablet by mouth daily.      Marland Kitchen NASONEX 50 MCG/ACT nasal spray Place 2 sprays into the nose daily.       . Pregabalin (LYRICA PO) Take 2 tablets by mouth daily. Unsure of dosage      . saxagliptin HCl (ONGLYZA) 5 MG TABS tablet Take 5 mg by mouth daily.      Marland Kitchen VIMPAT 100 MG TABS Take 1 tablet by mouth daily.       . Vitamins-Lipotropics (LIPO-FLAVONOID PLUS) TABS Take 1 tablet by mouth 3 (three) times daily after meals.        . metoprolol tartrate (LOPRESSOR) 25 MG tablet Take 25 mg by mouth 2 (two) times daily.      . NONFORMULARY OR COMPOUNDED ITEM Take 20 mg by mouth as needed. 20-100 mg as needed-compounded by pharmacy [facility])       No current facility-administered medications for this encounter.    Physical  Findings: The patient is in no acute distress. Patient is alert and oriented.  weight is 222 lb 14.4 oz (101.107 kg). His oral temperature is 98.3 F (36.8 C). His blood pressure is 129/72 and his pulse is 64. His respiration is 20. Marland Kitchen   No inguinal lymphadenopathy present. Some radiation change in the anal region which has healed well. No nodules/suspicious findings on rectal exam. No blood on exam glove.  Lab Findings: Lab Results  Component Value Date   WBC 5.0 02/25/2013   HGB 11.7* 02/25/2013   HCT 34.5* 02/25/2013   MCV 100.8* 02/25/2013   PLT 126* 02/25/2013     Radiographic Findings: No results found.  Impression:    The patient is clinically NED at this time.  Plan:  Followup in 6 months.   Radene Gunning, M.D., Ph.D.

## 2013-06-05 NOTE — Progress Notes (Signed)
Follow up rad txs anal 05/20/12-06/28/12  No c/o pain , regular bowel movements, sigmoidoscopy July 2014 negative, no c/o nausea, no fatigue, good appetite 11:39 AM

## 2013-06-20 DIAGNOSIS — Z23 Encounter for immunization: Secondary | ICD-10-CM | POA: Diagnosis not present

## 2013-06-24 ENCOUNTER — Encounter: Payer: Self-pay | Admitting: Nurse Practitioner

## 2013-06-24 ENCOUNTER — Ambulatory Visit (INDEPENDENT_AMBULATORY_CARE_PROVIDER_SITE_OTHER): Payer: Medicare Other | Admitting: Nurse Practitioner

## 2013-06-24 VITALS — BP 120/68 | HR 60 | Ht 71.0 in | Wt 233.0 lb

## 2013-06-24 DIAGNOSIS — G40309 Generalized idiopathic epilepsy and epileptic syndromes, not intractable, without status epilepticus: Secondary | ICD-10-CM | POA: Diagnosis not present

## 2013-06-24 NOTE — Progress Notes (Signed)
[facility]  PATIENT: Kirk [name] DOB: 1945-11-27   REASON FOR VISIT: Followup for seizure disorder   HISTORY OF PRESENT ILLNESS:Mr Martinec, 67 year old returns for follow up. He had a seizure in January 2014 on the second day of taking oxycodone This was a generalized tonic-clonic seizure lasting for a couple of minutes that was witnessed. Date of last seizure was September 24, 2012. 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 EEG was abnormal with evidence of mild left-sided slowing involving the left frontal temporal region but no evidence of epileptiform discharges Denies staring spells, confusion, sleep disturbances, lapses of time, headache and bowel and bladder incontinence. No evidence of tongue biting. No further seizure activity he is currently on Vimpat without side effects. 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 Cat Hair Extract Shortness Of Breath and Swelling    HOME MEDICATIONS: Outpatient Prescriptions Prior to Visit  Medication Sig Dispense Refill  . amitriptyline (ELAVIL) 25 MG tablet Take 25 mg by mouth at bedtime.       Marland Kitchen aspirin EC 81 MG tablet Take 81 mg by mouth daily.      . ASTEPRO 0.15 % SOLN Place 2 sprays into both nostrils at bedtime.       Marland Kitchen atorvastatin (LIPITOR) 20 MG tablet Take 80 mg by mouth daily.       . Cholecalciferol 1000 UNITS tablet 185 mg. Take 185 mg by mouth daily.      Marland Kitchen lisinopril-hydrochlorothiazide (PRINZIDE,ZESTORETIC) 10-12.5 MG per tablet Take 1 tablet by mouth daily.      . metoprolol tartrate  (LOPRESSOR) 25 MG tablet Take 25 mg by mouth 2 (two) times daily.      Marland Kitchen NASONEX 50 MCG/ACT nasal spray Place 2 sprays into the nose daily.       . NONFORMULARY OR COMPOUNDED ITEM Take 20 mg by mouth as needed. 20-100 mg as needed-compounded by pharmacy [facility])      . Pregabalin (LYRICA PO) Take 2 tablets by mouth daily. Unsure of dosage      . saxagliptin HCl (ONGLYZA) 5 MG TABS tablet Take 5 mg by mouth daily.      Marland Kitchen VIMPAT 100 MG TABS Take 1 tablet by mouth daily.       . Vitamins-Lipotropics (LIPO-FLAVONOID PLUS) TABS Take 1 tablet by mouth 3 (three) times daily after meals.         No facility-administered medications prior to visit.    PAST MEDICAL HISTORY: Past Medical History  Diagnosis Date  . Elevated cholesterol   . ED (erectile dysfunction)   . Aortic stenosis   . Coronary artery disease     aortic stenosis, CAD  . Angina     6 wks. ago  . Heart murmur   . Shortness of breath   . Arthritis     1995- cerv. fusion- [facility]  . CHF (congestive heart failure)   . Rectal bleeding   . Blood in stool   . Thrombosed hemorrhoids   . ED (erectile dysfunction)   . Kidney stones     hx  . 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 Diabetes mellitus     Type 2  . History of radiation therapy 05/20/12-06/28/12    anal cancer=54gy total dose  . Seizure disorder, grand mal   . Seizures     PAST SURGICAL HISTORY: Past Surgical History  Procedure Laterality Date  . Lithotripsy  2001 AND 2002    DR PETERSON  . C4-6 fusion  1995    DR STERN  . Aortic valve replacement  08/11/2011    Procedure: AORTIC VALVE REPLACEMENT (AVR);  Surgeon: Purcell Nails, MD;  Location: [facility] OR;  Service: Open Heart Surgery;  Laterality: N/A;  . Coronary artery bypass graft  08/16/2011    Procedure: CORONARY ARTERY BYPASS GRAFTING (CABG);  Surgeon: Purcell Nails, MD;  Location: [facility] OR;  Service: Open Heart Surgery;  Laterality: N/A;  CABG times one using left internal mammary  artery  . Anal fissure repair      patient unsure of date  . Colonoscopy      FAMILY HISTORY: Family History  Problem Relation Age of Onset  . Anesthesia problems Neg Hx   . Hypotension Neg Hx   . Malignant hyperthermia Neg Hx   . Pseudochol deficiency Neg Hx   . Colon cancer Neg Hx   . Cancer Mother     breast  . Heart attack Father     SOCIAL HISTORY: History   Social History  . Marital Status: Married    Spouse Name: N/A    Number of Children: N/A  . Years of Education: N/A   Occupational History  . Not on file.   Social History Main Topics  . Smoking status: Never Smoker   . Smokeless tobacco: Never Used  . Alcohol Use: Yes     Comment: RARE  . Drug Use: No  . Sexual Activity: Not on file   Other Topics Concern  . 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, [name]   Self-employed painter     PHYSICAL EXAM  Filed Vitals:   06/24/13 1035  BP: 120/68  Pulse: 60  Height: 5\' 11"  (1.803 m)  Weight: 233 lb (105.688 kg)   Body mass index is 32.51 kg/(m^2).  Generalized: Well developed, obese male in no acute distress   Neurological examination   Mentation: Alert oriented to time, place, history taking. Follows all commands speech and language fluent  Cranial nerve II-XII: Pupils were equal round reactive to light extraocular movements were full, visual field were full on confrontational test. Facial sensation and strength were normal. hearing was intact to finger rubbing bilaterally. Uvula tongue midline. head turning and shoulder shrug and were normal and symmetric.Tongue protrusion into cheek strength was normal. Motor: normal bulk and tone, full strength in the BUE, BLE, fine finger movements normal, no pronator drift. No focal weakness Coordination: finger-nose-finger, heel-to-shin bilaterally, no dysmetria Reflexes: Brachioradialis 2/2, biceps 2/2, triceps 2/2, patellar 2/2, Achilles 2/2, plantar responses were flexor bilaterally. Gait  and Station: Rising up from seated position without assistance, normal stance, moderate stride, good arm swing, smooth turning, able to perform tiptoe, and heel walking without difficulty.   DIAGNOSTIC DATA (LABS, IMAGING, TESTING) - I reviewed patient records, labs, notes, testing and imaging myself where available.  Lab Results  Component Value Date   WBC 5.0 02/25/2013   HGB 11.7* 02/25/2013   HCT 34.5* 02/25/2013   MCV 100.8* 02/25/2013   PLT 126* 02/25/2013  Component Value Date/Time   NA 137 12/06/2012 0905   NA 133* 09/24/2012 1725   K 4.5 12/06/2012 0905   K 3.9 09/24/2012 1725   CL 104 12/06/2012 0905   CL 94* 09/24/2012 1725   CO2 24 12/06/2012 0905   CO2 28 09/24/2012 1725   GLUCOSE 111* 12/06/2012 0905   GLUCOSE 115* 09/24/2012 1725   BUN 21.2 12/06/2012 0905   BUN 23 09/24/2012 1725   CREATININE 1.1 12/06/2012 0905   CREATININE 1.01 09/24/2012 1725   CALCIUM 9.1 12/06/2012 0905   CALCIUM 10.0 09/24/2012 1725   PROT 6.7 12/06/2012 0905   PROT 6.4 09/24/2012 1725   ALBUMIN 3.5 12/06/2012 0905   ALBUMIN 3.5 09/24/2012 1725   AST 20 12/06/2012 0905   AST 23 09/24/2012 1725   ALT 21 12/06/2012 0905   ALT 24 09/24/2012 1725   ALKPHOS 61 12/06/2012 0905   ALKPHOS 102 09/24/2012 1725   BILITOT 0.44 12/06/2012 0905   BILITOT 0.4 09/24/2012 1725   GFRNONAA 77* 09/24/2012 1725   GFRAA 89* 09/24/2012 1725      ASSESSMENT AND PLAN  67 y.o. year old male  has a past medical history of Elevated cholesterol; Aortic stenosis; Coronary artery disease; Angina;  Kidney stones; Cancer (04/09/12); Diabetes mellitus; History of radiation therapy (05/20/12-06/28/12); Seizure disorder, grand mal; here for followup, no further seizure activity  Pt to continue Vimpat does not need refills F/U yearly and prn Call for any seizure activity Nilda Riggs, [facility], [facility], APRN  [facility] [address] [place], [place] [address] [phone]

## 2013-06-24 NOTE — Patient Instructions (Signed)
Pt to continue Vimpat does not need refills F/U yearly and prn Call for any seizure activity

## 2013-07-04 ENCOUNTER — Other Ambulatory Visit: Payer: Self-pay

## 2013-07-04 DIAGNOSIS — G40309 Generalized idiopathic epilepsy and epileptic syndromes, not intractable, without status epilepticus: Secondary | ICD-10-CM

## 2013-07-04 MED ORDER — LACOSAMIDE 100 MG PO TABS: 1.0000 | ORAL_TABLET | Freq: Two times a day (BID) | ORAL | Status: DC

## 2013-07-04 NOTE — Telephone Encounter (Signed)
Kirk [name] called requesting refills on Vimpat.

## 2013-07-04 NOTE — Telephone Encounter (Signed)
I called patient's wife to let her know the prescription has been faxed to [facility] [address]

## 2013-07-16 ENCOUNTER — Other Ambulatory Visit: Payer: Self-pay | Admitting: Cardiology

## 2013-07-24 ENCOUNTER — Telehealth: Payer: Self-pay

## 2013-07-24 NOTE — Telephone Encounter (Signed)
Ms Kirk [name] called the clinic stating the Vimpat Rx was written incorrectly.  She said she wanted the Rx for 90 days, not 30 days.  I spoke with her and explained the Rx was written for 90 days.  She disagreed and said she was told it was for 30 days.  I told her I will call the pharmacy and figure out what is going on.  I called the pharmacy and spoke with [facility].  She said the Rx was written for 90 days, but they only filled it for 30 days, she is assuming due to the cost of the medication.  She said the patient is welcome to get the Rx for 90 days on his next fill if he wishes.  I called the patient back.  Spoke with Ms Kirk [name].  Relayed the conversation I had with [facility] to her.  She will follow up with them and call us back if needed.

## 2013-08-06 DIAGNOSIS — I10 Essential (primary) hypertension: Secondary | ICD-10-CM | POA: Diagnosis not present

## 2013-08-06 DIAGNOSIS — E785 Hyperlipidemia, unspecified: Secondary | ICD-10-CM | POA: Diagnosis not present

## 2013-08-06 DIAGNOSIS — G609 Hereditary and idiopathic neuropathy, unspecified: Secondary | ICD-10-CM | POA: Diagnosis not present

## 2013-08-06 DIAGNOSIS — E1149 Type 2 diabetes mellitus with other diabetic neurological complication: Secondary | ICD-10-CM | POA: Diagnosis not present

## 2013-08-06 DIAGNOSIS — N400 Enlarged prostate without lower urinary tract symptoms: Secondary | ICD-10-CM | POA: Diagnosis not present

## 2013-08-06 DIAGNOSIS — Z125 Encounter for screening for malignant neoplasm of prostate: Secondary | ICD-10-CM | POA: Diagnosis not present

## 2013-08-06 DIAGNOSIS — J309 Allergic rhinitis, unspecified: Secondary | ICD-10-CM | POA: Diagnosis not present

## 2013-08-11 ENCOUNTER — Ambulatory Visit (INDEPENDENT_AMBULATORY_CARE_PROVIDER_SITE_OTHER): Payer: Medicare Other | Admitting: Cardiology

## 2013-08-11 ENCOUNTER — Encounter: Payer: Self-pay | Admitting: Cardiology

## 2013-08-11 VITALS — BP 123/66 | HR 67 | Ht 71.0 in | Wt 233.0 lb

## 2013-08-11 DIAGNOSIS — I2510 Atherosclerotic heart disease of native coronary artery without angina pectoris: Secondary | ICD-10-CM | POA: Diagnosis not present

## 2013-08-11 DIAGNOSIS — E7800 Pure hypercholesterolemia, unspecified: Secondary | ICD-10-CM

## 2013-08-11 DIAGNOSIS — Z951 Presence of aortocoronary bypass graft: Secondary | ICD-10-CM | POA: Diagnosis not present

## 2013-08-11 DIAGNOSIS — Z952 Presence of prosthetic heart valve: Secondary | ICD-10-CM

## 2013-08-11 DIAGNOSIS — E669 Obesity, unspecified: Secondary | ICD-10-CM

## 2013-08-11 DIAGNOSIS — Z954 Presence of other heart-valve replacement: Secondary | ICD-10-CM | POA: Diagnosis not present

## 2013-08-11 DIAGNOSIS — C801 Malignant (primary) neoplasm, unspecified: Secondary | ICD-10-CM

## 2013-08-11 NOTE — Patient Instructions (Signed)
Your physician recommends that you continue on your current medications as directed. Please refer to the Current Medication list given to you today.  Your physician wants you to follow-up in: 6 months with Dr. Skains. You will receive a reminder letter in the mail two months in advance. If you don't receive a letter, please call our office to schedule the follow-up appointment.  

## 2013-08-11 NOTE — Progress Notes (Signed)
[address] [place], [place]  [address] Phone: [phone] Fax:  [phone]  Date:  08/11/2013   ID:  Kirk [name], DOB 1945/10/26, MRN [id number]  PCP:  [name], MD   History of Present Illness: Kirk [name] "Kirk [name]" is a 67 y.o. 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 Also has a supraclavicular and peri-abdominal aortic lymph node. Seen oncology at [facility] and had chemo.  He is doing well from a cardiac standpoint. No shortness of breath. He is feeling better since chemotherapy has ended. Each day gets better and better he states. Radiation as well. Cancer retro- aorta gone he states. Cancer is in remission he states.   No changes in medications. LDL cholesterol 78 on 12/14, triglycerides 220 range. Trying to lose a little bit of weight.    Wt Readings from Last 3 Encounters:  08/11/13 233 lb (105.688 kg)  06/24/13 233 lb (105.688 kg)  06/05/13 222 lb 14.4 oz (101.107 kg)     Past Medical History  Diagnosis Date  . Elevated cholesterol   . ED (erectile dysfunction)   . Aortic stenosis   . Coronary artery disease     aortic stenosis, CAD  . Angina     6 wks. ago  . Heart murmur   . Shortness of breath   . Arthritis     1995- cerv. fusion- [facility]  . CHF (congestive heart failure)   . Rectal bleeding   . Blood in stool   . Thrombosed hemorrhoids   . ED (erectile dysfunction)   . Kidney stones     hx  . 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 Diabetes mellitus     Type 2  . History of radiation therapy 05/20/12-06/28/12    anal cancer=54gy total dose  . Seizure disorder, grand mal   . Seizures     Past Surgical History  Procedure Laterality Date  . Lithotripsy  2001 AND 2002    DR PETERSON  . C4-6 fusion  1995    DR STERN  . Aortic valve replacement  08/11/2011   Procedure: AORTIC VALVE REPLACEMENT (AVR);  Surgeon: Purcell Nails, MD;  Location: [facility] OR;  Service: Open Heart Surgery;  Laterality: N/A;  . Coronary artery bypass graft  08/16/2011    Procedure: CORONARY ARTERY BYPASS GRAFTING (CABG);  Surgeon: Purcell Nails, MD;  Location: [facility] OR;  Service: Open Heart Surgery;  Laterality: N/A;  CABG times one using left internal mammary artery  . Anal fissure repair      patient unsure of date  . Colonoscopy      Current Outpatient Prescriptions  Medication Sig Dispense Refill  . amitriptyline (ELAVIL) 25 MG tablet Take 25 mg by mouth at bedtime.       Marland Kitchen aspirin EC 81 MG tablet Take 81 mg by mouth daily.      . ASTEPRO 0.15 % SOLN Place 2 sprays into both nostrils at bedtime.       Marland Kitchen atorvastatin (LIPITOR) 20 MG tablet Take 80 mg by mouth daily.       . Cholecalciferol 1000 UNITS tablet 185 mg. Take 185 mg by mouth daily.      . Lacosamide (VIMPAT) 100 MG TABS Take 1 tablet (100 mg total) by mouth 2 (two)  times daily.  180 tablet  3  . lisinopril-hydrochlorothiazide (PRINZIDE,ZESTORETIC) 10-12.5 MG per tablet Take 1 tablet by mouth daily.      . metoprolol tartrate (LOPRESSOR) 25 MG tablet TAKE 1 TABLET BY MOUTH TWICE A DAY  180 tablet  0  . NASONEX 50 MCG/ACT nasal spray Place 2 sprays into the nose daily.       . NONFORMULARY OR COMPOUNDED ITEM Take 20 mg by mouth as needed. 20-100 mg as needed-compounded by pharmacy [facility])      . Pregabalin (LYRICA PO) Take 2 tablets by mouth daily. Unsure of dosage      . saxagliptin HCl (ONGLYZA) 5 MG TABS tablet Take 5 mg by mouth daily.      . Vitamins-Lipotropics (LIPO-FLAVONOID PLUS) TABS Take 1 tablet by mouth 3 (three) times daily after meals.         No current facility-administered medications for this visit.    Allergies:    Allergies  Allergen Reactions  . Cat Hair Extract Shortness Of Breath and Swelling    Social History:  The patient  reports that he has never smoked. He has never used  smokeless tobacco. He reports that he drinks alcohol. He reports that he does not use illicit drugs.  Kirk [name] is his wife. Kirk [name].  ROS:  Please see the history of present illness.   Denies any anginal symptoms, no syncope, no fevers, no chills, no orthopnea. Hair is growing back.    PHYSICAL EXAM: VS:  BP 123/66  Pulse 67  Ht 5\' 11"  (1.803 m)  Wt 233 lb (105.688 kg)  BMI 32.51 kg/m2 Well nourished, well developed, in no acute distress HEENT: normal Neck: no JVD Cardiac:  normal S1, S2; RRR; soft systolic murmur Lungs:  clear to auscultation bilaterally, no wheezing, rhonchi or rales Abd: soft, nontender, no hepatomegalyoverweight Ext: no edema Skin: warm and dry Neuro: no focal abnormalities noted  EKG:  None today     ASSESSMENT AND PLAN:  1. Coronary artery disease-status post bypass. Doing very well, no anginal symptoms. Bypass was done in the setting of aortic valve replacement. 2. Aortic valve replacement-bioprosthetic-doing very well. Soft murmur heard. 3. Hyperlipidemia-excellent lipid results however triglycerides are mildly elevated. Weight loss, diet. Continue with statin therapy. 4. Obesity-I'm fine with him losing 5-10 pounds as well. I do believe that early on after his cancer, his weight loss was troubling.   Signed, Donato Schultz, MD [facility]  08/11/2013 9:39 AM

## 2013-09-05 ENCOUNTER — Other Ambulatory Visit: Payer: Self-pay | Admitting: Neurology

## 2013-09-05 MED ORDER — LACOSAMIDE 100 MG PO TABS: 1.0000 | ORAL_TABLET | Freq: Two times a day (BID) | ORAL | Status: DC

## 2013-09-08 ENCOUNTER — Telehealth: Payer: Self-pay | Admitting: Nurse Practitioner

## 2013-09-08 NOTE — Progress Notes (Signed)
Pts wife given 3 wks samples of Vimpat 100mg  po bid.  Dr. Krista Blue is primary provider as well as Cecille Rubin, NP.

## 2013-09-08 NOTE — Telephone Encounter (Signed)
Patient's wife is hear and is demanding that this be addressed right now. She is going to wait until it is done. I have notified J Banks, RPhA who has informed me that prior auth. has not come through yet. Wife states all we have to do is call. Royetta Asal states that she is on the phone right now and will get to when when she is done.

## 2013-09-08 NOTE — Telephone Encounter (Signed)
I contacted [employer].  Vimpat is not a formulary item.  I have requested a formulary exception.  All information requested was provided.  They have submitted the patient's info to the medical review board for decision.  It has been marked Urgent.  They will notify us of the outcome once it has been made.  PA Case [id number] In the meantime, patient was given samples.

## 2013-09-08 NOTE — Telephone Encounter (Signed)
Needs PA and release to [facility]... Was given enough to last until today. So they need this taken care of today. Her RX has been changed to [id number] group # [phone]. In patients name. Phone # [phone]

## 2013-09-29 DIAGNOSIS — S058X9A Other injuries of unspecified eye and orbit, initial encounter: Secondary | ICD-10-CM | POA: Diagnosis not present

## 2013-10-02 ENCOUNTER — Telehealth: Payer: Self-pay | Admitting: Oncology

## 2013-10-02 ENCOUNTER — Ambulatory Visit (HOSPITAL_BASED_OUTPATIENT_CLINIC_OR_DEPARTMENT_OTHER): Payer: Medicare Other | Admitting: Oncology

## 2013-10-02 VITALS — BP 150/64 | HR 71 | Temp 98.7°F | Resp 18 | Ht 71.0 in | Wt 240.1 lb

## 2013-10-02 DIAGNOSIS — R52 Pain, unspecified: Secondary | ICD-10-CM

## 2013-10-02 DIAGNOSIS — B977 Papillomavirus as the cause of diseases classified elsewhere: Secondary | ICD-10-CM | POA: Diagnosis not present

## 2013-10-02 DIAGNOSIS — C770 Secondary and unspecified malignant neoplasm of lymph nodes of head, face and neck: Secondary | ICD-10-CM | POA: Diagnosis not present

## 2013-10-02 DIAGNOSIS — C211 Malignant neoplasm of anal canal: Secondary | ICD-10-CM | POA: Diagnosis not present

## 2013-10-02 DIAGNOSIS — R102 Pelvic and perineal pain: Secondary | ICD-10-CM

## 2013-10-02 NOTE — Telephone Encounter (Signed)
gv and printed appt sched and avs for pt for Feba dn March...sed gv barium

## 2013-10-02 NOTE — Progress Notes (Signed)
   [place]    OFFICE PROGRESS NOTE   INTERVAL HISTORY:   He returns for scheduled followup of anal cancer. Kirk [name] complains of discomfort in the bilateral groin and suprapubic area for the past few months. The pain is intermittent. No dysuria. No difficulty with bowel function. He has intermittent bilateral testicle pain. Good appetite.  He reports mood swings and lethargy while taking Vimpat. No seizures. He is scheduling an appointment with a new neurologist.  Objective:  Vital signs in last 24 hours:  Blood pressure 150/64, pulse 71, temperature 98.7 F (37.1 C), temperature source Oral, resp. rate 18, height 5\' 11"  (1.803 m), weight 240 lb 1.6 oz (108.909 kg).    HEENT: Neck without mass Lymphatics: No cervical, supraclavicular, axillary, or inguinal nodes, examination of the bilateral groin is unremarkable Resp: Lungs clear bilaterally Cardio: Regular rate and rhythm GI: No hepatosplenomegaly, nontender, no mass Vascular: No leg edema Rectal: No nodularity or mass at the anal verge or rectum. Normal tone.  GU: Testes without mass Musculoskeletal: No pain with motion at the hip bilaterally    Medications: I have reviewed the patient's current medications.  Assessment/Plan: 1.Squamous cell carcinoma of the anal canal, HPV positive.  2. Staging CT/PET scan with tiny lung nodules, hypermetabolic retroperitoneal, right inguinal, and left supraclavicular nodes . FNA biopsy of a left supraclavicular lymph node on 05/03/2012 confirmed metastatic squamous cell carcinoma. -Cycle 1 of 5-fluorouracil/mitomycin-C and concurrent radiation on 05/20/2012. Cycle 2 of 5-fluorouracil/mitomycin-C on 06/24/2012 with completion of radiation on 06/28/2012. -Restaging PET scan 08/02/2012 with resolution of the hypermetabolic anal primary and right inguinal lymph node. Persistent hypermetabolic supraclavicular and retroperitoneal nodes. -cycle 1 of salvage Taxol/carboplatin  chemotherapy on 08/30/2012 . Restaging CTs after 4 cycles of Taxol/carboplatin confirmed resolution of retroperitoneal lymphadenopathy and a decrease in the size of a previously noted left supraclavicular node. He completed cycle 6 of Taxol/carboplatin on 01/02/2013.  -A negative flexible sigmoidoscopy 04/01/2013  3. Hypermetabolic activity at the right supraglottic larynx on the PET scan 04/17/2012 with associated soft tissue fullness status post a negative ENT evaluation by Dr. Constance Holster.  4. History of aortic stenosis-status post aortic valve replacement and coronary artery bypass surgery in November 2012.  5. Mucositis following cycle 1 chemotherapy. Resolved.  6. Skin breakdown at the perineum and penis secondary to chemotherapy and radiation. Resolved.  7. 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 Bone pain after Neulasta  9. Generalized seizure 09/24/2012-negative brain CT,? Etiology. He has been evaluated by neurology and was prescribed Vimpat.  10.Thrombocytopenia secondary chemotherapy -improved  11. Peripheral neuropathy-predating chemotherapy  12. Erectile dysfunction-managed by Dr.Reade, predated chemotherapy/radiation  13. Groin/suprapubic pain-persisted for the past 2 months,? Etiology  Disposition:  [name] remains in clinical remission from anal cancer. The etiology of his groin discomfort is unclear. We decided to proceed with a restaging CT evaluation. We will also check a urinalysis. He will return for an office visit in 3 months. He will contact us for increased pain or new symptoms.   Betsy Coder, MD  10/02/2013  3:41 PM

## 2013-10-06 DIAGNOSIS — R5381 Other malaise: Secondary | ICD-10-CM | POA: Diagnosis not present

## 2013-10-06 DIAGNOSIS — Z23 Encounter for immunization: Secondary | ICD-10-CM | POA: Diagnosis not present

## 2013-10-06 DIAGNOSIS — R5383 Other fatigue: Secondary | ICD-10-CM | POA: Diagnosis not present

## 2013-10-06 DIAGNOSIS — Z0000 Encounter for general adult medical examination without abnormal findings: Secondary | ICD-10-CM | POA: Diagnosis not present

## 2013-10-09 ENCOUNTER — Ambulatory Visit (HOSPITAL_COMMUNITY)
Admission: RE | Admit: 2013-10-09 | Discharge: 2013-10-09 | Disposition: A | Discharge status: Home / Self Care | Payer: Medicare Other | Source: Ambulatory Visit | Attending: Oncology | Admitting: Oncology

## 2013-10-09 ENCOUNTER — Other Ambulatory Visit (HOSPITAL_BASED_OUTPATIENT_CLINIC_OR_DEPARTMENT_OTHER): Payer: Medicare Other

## 2013-10-09 ENCOUNTER — Encounter (HOSPITAL_COMMUNITY): Payer: Self-pay

## 2013-10-09 DIAGNOSIS — R911 Solitary pulmonary nodule: Secondary | ICD-10-CM | POA: Insufficient documentation

## 2013-10-09 DIAGNOSIS — I700 Atherosclerosis of aorta: Secondary | ICD-10-CM | POA: Insufficient documentation

## 2013-10-09 DIAGNOSIS — Z923 Personal history of irradiation: Secondary | ICD-10-CM | POA: Insufficient documentation

## 2013-10-09 DIAGNOSIS — M533 Sacrococcygeal disorders, not elsewhere classified: Secondary | ICD-10-CM | POA: Insufficient documentation

## 2013-10-09 DIAGNOSIS — Z9221 Personal history of antineoplastic chemotherapy: Secondary | ICD-10-CM | POA: Insufficient documentation

## 2013-10-09 DIAGNOSIS — R109 Unspecified abdominal pain: Secondary | ICD-10-CM | POA: Insufficient documentation

## 2013-10-09 DIAGNOSIS — I6529 Occlusion and stenosis of unspecified carotid artery: Secondary | ICD-10-CM | POA: Diagnosis not present

## 2013-10-09 DIAGNOSIS — I658 Occlusion and stenosis of other precerebral arteries: Secondary | ICD-10-CM | POA: Insufficient documentation

## 2013-10-09 DIAGNOSIS — K4090 Unilateral inguinal hernia, without obstruction or gangrene, not specified as recurrent: Secondary | ICD-10-CM | POA: Insufficient documentation

## 2013-10-09 DIAGNOSIS — C211 Malignant neoplasm of anal canal: Secondary | ICD-10-CM

## 2013-10-09 DIAGNOSIS — R102 Pelvic and perineal pain: Secondary | ICD-10-CM

## 2013-10-09 DIAGNOSIS — J984 Other disorders of lung: Secondary | ICD-10-CM | POA: Diagnosis not present

## 2013-10-09 DIAGNOSIS — K7689 Other specified diseases of liver: Secondary | ICD-10-CM | POA: Diagnosis not present

## 2013-10-09 LAB — BASIC METABOLIC PANEL (CC13)
ANION GAP: 11 meq/L (ref 3–11)
BUN: 11.6 mg/dL (ref 7.0–26.0)
CALCIUM: 10.1 mg/dL (ref 8.4–10.4)
CO2: 28 mEq/L (ref 22–29)
Chloride: 102 mEq/L (ref 98–109)
Creatinine: 1.1 mg/dL (ref 0.7–1.3)
Glucose: 120 mg/dl (ref 70–140)
Potassium: 4.4 mEq/L (ref 3.5–5.1)
SODIUM: 140 meq/L (ref 136–145)

## 2013-10-09 LAB — URINALYSIS, MICROSCOPIC - CHCC
Bilirubin (Urine): NEGATIVE
Blood: NEGATIVE
Glucose: NEGATIVE mg/dL
KETONES: NEGATIVE mg/dL
LEUKOCYTE ESTERASE: NEGATIVE
Nitrite: NEGATIVE
PH: 6 (ref 4.6–8.0)
Protein: NEGATIVE mg/dL
RBC / HPF: NEGATIVE (ref 0–2)
Specific Gravity, Urine: 1.01 (ref 1.003–1.035)
Urobilinogen, UR: 0.2 mg/dL (ref 0.2–1)

## 2013-10-09 MED ORDER — IOHEXOL 300 MG/ML  SOLN
125.0000 mL | Freq: Once | INTRAMUSCULAR | Status: AC | PRN
  Administered 2013-10-09: 125 mL via INTRAVENOUS

## 2013-10-13 ENCOUNTER — Ambulatory Visit (INDEPENDENT_AMBULATORY_CARE_PROVIDER_SITE_OTHER): Payer: Medicare Other | Admitting: Neurology

## 2013-10-13 ENCOUNTER — Encounter: Payer: Self-pay | Admitting: Neurology

## 2013-10-13 VITALS — BP 130/78 | HR 78 | Temp 97.0°F | Resp 18 | Ht 67.0 in | Wt 242.0 lb

## 2013-10-13 DIAGNOSIS — R569 Unspecified convulsions: Secondary | ICD-10-CM

## 2013-10-13 NOTE — Patient Instructions (Signed)
1. Continue Vimpat 100mg  daily for now 2. Please call our office regarding Lyrica dose 3. Call after making decision regarding Lamotrigine  SEIZURE PRECAUTIONS: 1. If medication has been prescribed for you to prevent seizures, take it exactly as directed.  Do not stop taking the medicine without talking to your doctor first, even if you have not had a seizure in a long time.   2. Avoid activities in which a seizure would cause danger to yourself or to others.  Don't operate dangerous machinery, swim alone, or climb in high or dangerous places, such as on ladders, roofs, or girders.  Do not drive unless your doctor says you may.  3. If you have any warning that you may have a seizure, lay down in a safe place where you can't hurt yourself.    4.  No driving for 6 months from last seizure, as per [facility].   Please refer to the following link on the [place] website for more information: http://[email or web address]/[email or web address]/drivingu.cfm   5.  Maintain good sleep hygiene.  6.  Notify your neurology if you are planning pregnancy or if you become pregnant.  7.  Contact your doctor if you have any problems that may be related to the medicine you are taking.  8.  Call 911 and bring the patient back to the ED if:        A.  The seizure lasts longer than 5 minutes.       B.  The patient doesn't awaken shortly after the seizure  C.  The patient has new problems such as difficulty seeing, speaking or moving  D.  The patient was injured during the seizure  E.  The patient has a temperature over 102 F (39C)  F.  The patient vomited and now is having trouble breathing

## 2013-10-13 NOTE — Progress Notes (Signed)
NEUROLOGY CONSULTATION NOTE  IZAYA NETHERTON MRN: [id number] DOB: 01/20/1946  Referring provider: Dr. Maury Dus Primary care provider: Dr. Maury Dus  Reason for consult:  Establish seizure care  Dear Dr. Alyson Ingles:  Thank you for your kind referral of Kirk [name] for consultation of seizure. Although his history is well known to you, please allow me to reiterate it for the purpose of our medical record. The patient was accompanied to the clinic by his wife who also provides collateral information.   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  He had a single seizure on 09/24/12, which he is amnestic of.  He and his wife reports that he was in the middle of chemotherapy and had received pain bone injections for which he took oxycodone.  He recalls taking oxycodone in the morning and at 2pm, then 20 minutes after was witnessed by his co-worker to have a convulsion.  This was described as starting in the right leg, progressing to the right arm, then whole-body shaking lasting 2 minutes or so.  He was post-ictal after with tongue bite and urinary incontinence.  He denied any post-ictal focal weakness/numbness/tingling, no headache.  He had an MRI brain with and without contrast done 09/24/12 which I personally reviewed, unremarkable, hippocampi symmetric, with mild chronic small vessel disease.  Per prior neurology notes, his EEG showed mild left-sided slowing involving the left frontal temporal region.  He was initially started on Keppra which he took for 2 weeks, however caused significant mood side effects of crying and depression.  He was switched to Vimpat and was supposed to increase dose to 100mg  BID, but has only been taking 100mg  qhs.  He denies any diplopia,  dizziness, gait instability on the medication, however his wife is concerned about continued mood swings, although better than with Keppra.  There is no prior history of depression, and he reports that even with chemo and radiation, he was happy, however since starting seizure medication, he has been in a state of depression, with negative attitude, mood swings, anhedonia, lack of interest in his usual activities, "just not me."  He has also been having occasional sharp left groin pain, at times also on the right, however scans have been unrevealing, and he feels that this may be due to Vimpat as well.  He has been taking Lyrica at bedtime for at least 7 months for diabetic neuropathy, which has helped with leg paresthesias.  They are unsure of the dose.  He and his wife deny any staring/unresponsive episodes, gaps in time, olfactory/gustatory hallucinations, rising epigastric sensation, focal numbness/tingling/weakness, myoclonic jerks.  He denies any prior seizures, he lost consciousness in 2012 and was found to have aortic stenosis, no seizure-like activity at that time.  He has a history of C4-6 cervical fusion in 1995 when he had tingling in both hands, which has resolved.  He has mild paresthesias in both feet.  He denies any dizziness, diplopia, blurred vision, dysarthria, dysphagia, neck/back pain, bowel/bladder dysfunction. He has been taking amitriptyline for the past 25 years for sleep.  He denied any sleep deprivation or alcohol the night prior to the seizure.    Epilepsy Risk Factors:  He had a normal birth and early development.  There is no history of febrile convulsions, CNS infections such as  meningitis/encephalitis, significant traumatic brain injury, neurosurgical procedures, or family history of seizures.  Laboratory Data: Lab Results  Component Value Date   WBC 5.0 02/25/2013   HGB 11.7* 02/25/2013   HCT 34.5* 02/25/2013   MCV 100.8* 02/25/2013   PLT 126* 02/25/2013     Chemistry        Component Value Date/Time   NA 140 10/09/2013 0811   NA 133* 09/24/2012 1725   K 4.4 10/09/2013 0811   K 3.9 09/24/2012 1725   CL 104 12/06/2012 0905   CL 94* 09/24/2012 1725   CO2 28 10/09/2013 0811   CO2 28 09/24/2012 1725   BUN 11.6 10/09/2013 0811   BUN 23 09/24/2012 1725   CREATININE 1.1 10/09/2013 0811   CREATININE 1.01 09/24/2012 1725      Component Value Date/Time   CALCIUM 10.1 10/09/2013 0811   CALCIUM 10.0 09/24/2012 1725   ALKPHOS 61 12/06/2012 0905   ALKPHOS 102 09/24/2012 1725   AST 20 12/06/2012 0905   AST 23 09/24/2012 1725   ALT 21 12/06/2012 0905   ALT 24 09/24/2012 1725   BILITOT 0.44 12/06/2012 0905   BILITOT 0.4 09/24/2012 1725       PAST MEDICAL HISTORY: Past Medical History  Diagnosis Date  . Elevated cholesterol   . ED (erectile dysfunction)   . Aortic stenosis   . Coronary artery disease     aortic stenosis, CAD  . Angina     6 wks. ago  . Heart murmur   . Shortness of breath   . Arthritis     1995- cerv. fusion- [facility]  . CHF (congestive heart failure)   . Rectal bleeding   . Blood in stool   . Thrombosed hemorrhoids   . ED (erectile dysfunction)   . Kidney stones     hx  . 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 Diabetes mellitus     Type 2  . History of radiation therapy 05/20/12-06/28/12    anal cancer=54gy total dose  . Seizure disorder, grand mal   . Seizures     PAST SURGICAL HISTORY: Past Surgical History  Procedure Laterality Date  . Lithotripsy  2001 AND 2002    DR PETERSON  . C4-6 fusion  [facility]  . Aortic valve replacement  08/11/2011    Procedure: AORTIC VALVE REPLACEMENT (AVR);  Surgeon: Rexene Alberts, MD;  Location: [facility];  Service: Open Heart Surgery;  Laterality: N/A;  . Coronary artery bypass graft  08/16/2011    Procedure: CORONARY ARTERY BYPASS GRAFTING (CABG);  Surgeon: Rexene Alberts, MD;  Location: [facility];  Service: Open Heart Surgery;  Laterality: N/A;  CABG times one using left internal mammary  artery  . Anal fissure repair      patient unsure of date  . Colonoscopy      MEDICATIONS: Current Outpatient Prescriptions on File Prior to Visit  Medication Sig Dispense Refill  . amitriptyline (ELAVIL) 25 MG tablet Take 25 mg by mouth at bedtime.       Marland Kitchen aspirin EC 81 MG tablet Take 81 mg by mouth daily.      . ASTEPRO 0.15 % SOLN Place 2 sprays into both nostrils at bedtime.       Marland Kitchen atorvastatin (LIPITOR) 20 MG tablet Take 80 mg by mouth daily.       . Cholecalciferol 1000 UNITS tablet 185 mg. Take 185 mg by mouth daily.      Marland Kitchen  Lacosamide (VIMPAT) 100 MG TABS Take 1 tablet (100 mg total) by mouth 2 (two) times daily.  180 tablet  3  . lisinopril-hydrochlorothiazide (PRINZIDE,ZESTORETIC) 10-12.5 MG per tablet Take 1 tablet by mouth daily.      . metoprolol tartrate (LOPRESSOR) 25 MG tablet TAKE 1 TABLET BY MOUTH TWICE A DAY  180 tablet  0  . NASONEX 50 MCG/ACT nasal spray Place 2 sprays into the nose daily.       . Pregabalin (LYRICA PO) Take 1 tablet by mouth daily. Unsure of dosage      . tobramycin-dexamethasone (TOBRADEX) ophthalmic solution       . Vitamins-Lipotropics (LIPO-FLAVONOID PLUS) TABS Take 1 tablet by mouth 3 (three) times daily after meals.        Marland Kitchen JANUVIA 100 MG tablet Take 100 mg by mouth daily.       . NONFORMULARY OR COMPOUNDED ITEM Take 20 mg by mouth as needed. 20-100 mg as needed-compounded by pharmacy [facility])       No current facility-administered medications on file prior to visit.    ALLERGIES: Allergies  Allergen Reactions  . Cat Hair Extract Shortness Of Breath and Swelling    FAMILY HISTORY: Family History  Problem Relation Age of Onset  . Anesthesia problems Neg Hx   . Hypotension Neg Hx   . Malignant hyperthermia Neg Hx   . Pseudochol deficiency Neg Hx   . Colon cancer Neg Hx   . Cancer Mother     breast  . Heart attack Father     SOCIAL HISTORY: History   Social History  . Marital Status: Married    Spouse Name: N/A     Number of Children: N/A  . Years of Education: N/A   Occupational History  . Not on file.   Social History Main Topics  . Smoking status: Never Smoker   . Smokeless tobacco: Never Used  . Alcohol Use: Yes     Comment: RARE  . Drug Use: No  . Sexual Activity: Not on file   Other Topics Concern  . 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, Administrator, sports    REVIEW OF SYSTEMS: Constitutional: No fevers, chills, or sweats, no generalized fatigue, change in appetite Eyes: No visual changes, double vision, eye pain Ear, nose and throat: No hearing loss, ear pain, nasal congestion, sore throat Cardiovascular: No chest pain, palpitations Respiratory:  No shortness of breath at rest or with exertion, wheezes GastrointestinaI: No nausea, vomiting, diarrhea, abdominal pain, fecal incontinence Genitourinary:  No dysuria, urinary retention or frequency Musculoskeletal:  No neck pain, back pain Integumentary: No rash, pruritus, skin lesions Neurological: as above Psychiatric: +depression, no insomnia, anxiety Endocrine: No palpitations, fatigue, diaphoresis, mood swings, change in appetite, change in weight, increased thirst Hematologic/Lymphatic:  No anemia, purpura, petechiae. Allergic/Immunologic: no itchy/runny eyes, nasal congestion, recent allergic reactions, rashes  PHYSICAL EXAM: Filed Vitals:   10/13/13 0802  BP: 130/78  Pulse: 78  Temp: 97 F (36.1 C)  Resp: 18   General: No acute distress Head:  Normocephalic/atraumatic Neck: supple, no paraspinal tenderness, full range of motion Back: No paraspinal tenderness Heart: regular rate and rhythm Lungs: Clear to auscultation bilaterally. Vascular: No carotid bruits. Skin/Extremities: No rash, no edema Neurological Exam: Mental status: alert and oriented to person, place, and time, no dysarthria or dysphagia, intact fluency and comprehension. Cranial nerves: CN I: not tested CN II: pupils  equal, round and reactive to light, visual  fields intact, fundi unremarkable. CN III, IV, VI:  full range of motion, no nystagmus, no ptosis CN V: facial sensation intact CN VII: upper and lower face symmetric CN VIII: hearing intact CN IX, X: gag intact, uvula midline CN XI: sternocleidomastoid and trapezius muscles intact CN XII: tongue midline Bulk & Tone: normal, no fasciculations. Motor: 5/5 throughout with no pronator drift. Sensation: decreased cold sensation up to the ankles bilaterally. intact to light touch, pin, vibration and joint position sense.  No extinction to double simultaneous stimulation.  Romberg test negative. Deep Tendon Reflexes: +2 throughout, except for absent ankle jerks bilaterally. No ankle clonus Plantar responses: downgoing bilaterally Finger to nose testing: no incoordination Gait: narrow-based and steady, able to tandem walk adequately.  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  He had one seizure on 09/24/12, by history suggestive of a partial seizure that secondarily generalized.  Routine EEG reported slowing over the left frontotemporal region, MRI brain unremarkable.  Recommendation is to continue anti-epileptic medication at this time.  He is having significant mood swings and personality changes with Vimpat 100mg  qhs, and is interested in switching to a different AED.  He may benefit from an AED with mood-stabilizing properties such as Lamotrigine.  Side effects were discussed, including Kathreen Cosier syndrome.  He and his wife would like to do some research first and will call our office if they decide to proceed.  He will need a slow uptitration of Lamotrigine, at which point Vimpat will be slowly tapered off.  He understands risks for breakthrough  seizures with any medication adjustment.  We also discussed that Lyrica which he takes for neuropathy is also an AED.  [place] driving laws were discussed with the patient, and he knows to stop driving after a seizure, until 6 months seizure-free.  Seizure precautions were discussed which include no bathing in a tub, no swimming alone, no cooking over an open flame, no operating dangerous machinery.   Return to clinic in 3 months.  He knows to call our office for any problems.  Thank you for allowing me to participate in the care of this patient. Please do not hesitate to call for any questions or concerns.   Ellouise Newer, M.D.

## 2013-10-14 ENCOUNTER — Telehealth: Payer: Self-pay | Admitting: Neurology

## 2013-10-14 NOTE — Telephone Encounter (Signed)
Pt seen yesterday. Wife calling back to report patient currently taking Lyrica 100mg . Wife says patient would like to try med change discussed at appt, she could not remember the med names but is interested in going forward. Please call [phone] / Sherri S.

## 2013-10-15 ENCOUNTER — Encounter: Payer: Self-pay | Admitting: *Deleted

## 2013-10-15 ENCOUNTER — Other Ambulatory Visit: Payer: Self-pay | Admitting: *Deleted

## 2013-10-15 MED ORDER — LAMOTRIGINE ER 25 MG PO TB24: ORAL_TABLET | ORAL | Status: DC

## 2013-10-15 NOTE — Telephone Encounter (Signed)
Rx sent to pharmacy   

## 2013-10-15 NOTE — Telephone Encounter (Signed)
Patient would like to try the Lamotrigine per your conversation at last office visit

## 2013-10-15 NOTE — Telephone Encounter (Signed)
Kirk [name] IS RETURNING YOUR CALL PLEASE CALL HER BACK AT [phone]

## 2013-10-15 NOTE — Telephone Encounter (Signed)
Pls send prescription for Lamotrigine ER, thanks. Please mail written instructions below.  Spoke to wife, he will start Lamotrigine ER as follows:  Lamotrigine ER 25mg  1 tab daily for 2 weeks, then increase to 2 tabs daily for 2 weeks, then increase to 4 tabs daily and continue.  At week 7, start reducing Vimpat 100mg  tab: decrease to 1/2 tab at bedtime for 1 week, then 1/2 tab every other night for 1 week, then stop.  She is aware of side effects of rash, Kathreen Cosier, dizziness/diplopia, as discussed on visit. Aware of risks of breakthrough seizures with medication adjustments.

## 2013-10-15 NOTE — Telephone Encounter (Signed)
Left voicemail, would like to personally discuss Lamotrigine instructions and side effects, and discuss lamotrigine ER versus IR.

## 2013-10-23 ENCOUNTER — Telehealth: Payer: Self-pay | Admitting: Cardiology

## 2013-10-23 MED ORDER — LOSARTAN POTASSIUM 50 MG PO TABS: 50.0000 mg | ORAL_TABLET | Freq: Every day | ORAL | Status: DC

## 2013-10-23 NOTE — Telephone Encounter (Signed)
Pt 's wife called back.  She states Damonta has a nagging, annoying dry cough that persists since starting lisinopril.  She states his lungs are clear per a recent physical and oncology visit.  If changing med, please send to pharmacy on file, [place], [place].

## 2013-10-23 NOTE — Telephone Encounter (Signed)
lmtcb

## 2013-10-23 NOTE — Telephone Encounter (Signed)
Stop lisinopril/hydrochlorothiazide. Start losartan 50mg  PO QD.

## 2013-10-23 NOTE — Telephone Encounter (Signed)
Called and told Kirk [name] about changing meds to losartan 50mg  Daily.  She will pick it up tomorrow.

## 2013-10-23 NOTE — Telephone Encounter (Signed)
New Prob    States pt is experiencing a constant persistent cough he associates with Lisinopril that initially started several months ago. Wife is concerned, please call.

## 2013-10-23 NOTE — Telephone Encounter (Signed)
Will forward to Dr Skains. 

## 2013-11-24 DIAGNOSIS — J209 Acute bronchitis, unspecified: Secondary | ICD-10-CM | POA: Diagnosis not present

## 2013-12-04 ENCOUNTER — Telehealth: Payer: Self-pay | Admitting: Neurology

## 2013-12-04 NOTE — Telephone Encounter (Signed)
Please advise 

## 2013-12-04 NOTE — Telephone Encounter (Signed)
Pt currently taking Vimpat 100mg  per day (25mg  x 4). Script refilled yesterday. Wife called, she would like new script started next month to have script changed to 100mg  tabs (instead of taking 25mg x4 to make the 100mg  dose). / Sherri S.

## 2013-12-04 NOTE — Telephone Encounter (Signed)
Please send Rx for Vimpat 100mg , take 1 tab at bedtime.  #30, with 1 refill.  My understanding is we are tapering this, please let her know there is 1 refill as we continue the taper.  Thanks

## 2013-12-08 ENCOUNTER — Other Ambulatory Visit: Payer: Self-pay | Admitting: *Deleted

## 2013-12-08 MED ORDER — LACOSAMIDE 100 MG PO TABS: ORAL_TABLET | ORAL | Status: DC

## 2013-12-08 NOTE — Telephone Encounter (Signed)
Medication refill sent to the pharmacy  

## 2013-12-11 ENCOUNTER — Ambulatory Visit: Payer: Medicare Other | Admitting: Radiation Oncology

## 2013-12-18 ENCOUNTER — Encounter: Payer: Self-pay | Admitting: Radiation Oncology

## 2013-12-18 ENCOUNTER — Ambulatory Visit
Admission: RE | Admit: 2013-12-18 | Discharge: 2013-12-18 | Disposition: A | Discharge status: Home / Self Care | Payer: Medicare Other | Source: Ambulatory Visit | Attending: Radiation Oncology | Admitting: Radiation Oncology

## 2013-12-18 VITALS — BP 127/54 | HR 68 | Temp 97.9°F | Resp 20 | Wt 239.8 lb

## 2013-12-18 DIAGNOSIS — C211 Malignant neoplasm of anal canal: Secondary | ICD-10-CM

## 2013-12-18 NOTE — Addendum Note (Signed)
Encounter addended by: Marye Round, MD on: 12/18/2013  6:36 PM<BR>     Documentation filed: Notes Section

## 2013-12-18 NOTE — Progress Notes (Addendum)
Radiation Oncology         [phone] ________________________________  Name: Kirk [name] MRN: [id number]  Date: 12/18/2013  DOB: November 26, 1945  Follow-Up Visit Note  CC: Vena Austria, MD  Maury Dus, MD  Diagnosis:   Is a carcinoma of the anal canal  Interval Since Last Radiation:  One and a half years   Narrative:  The patient returns today for routine follow-up.  The patient is doing well at this time. 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 He denies any rectal pain. He states that he is having regular bowel movements. No nausea. The patient does complain of a lack of erections despite medication. We discussed seeing urology for further evaluation/management.                              ALLERGIES:  is allergic to cat hair extract.  Meds: Current Outpatient Prescriptions  Medication Sig Dispense Refill  . amitriptyline (ELAVIL) 25 MG tablet Take 25 mg by mouth at bedtime.       Marland Kitchen aspirin EC 81 MG tablet Take 81 mg by mouth daily.      . ASTEPRO 0.15 % SOLN Place 2 sprays into both nostrils at bedtime.       Marland Kitchen atorvastatin (LIPITOR) 20 MG tablet Take 80 mg by mouth daily.       . Cholecalciferol 1000 UNITS tablet 185 mg. Take 185 mg by mouth daily.      Marland Kitchen JANUVIA 100 MG tablet Take 100 mg by mouth daily.       . Lacosamide (VIMPAT) 100 MG TABS Take one tab(100mg ) by mouth at bed time  30 tablet  1  . LamoTRIgine (LAMICTAL XR) 25 MG TB24 tablet Take as instructed  120 tablet  3  . losartan (COZAAR) 50 MG tablet Take 1 tablet (50 mg total) by mouth daily.  90 tablet  3  . metoprolol tartrate (LOPRESSOR) 25 MG tablet TAKE 1 TABLET BY MOUTH TWICE A DAY  180 tablet  0  . NASONEX 50 MCG/ACT nasal spray Place 2 sprays into the nose daily.       . NONFORMULARY OR COMPOUNDED ITEM Take 20 mg by mouth as needed. 20-100 mg as needed-compounded by pharmacy [facility])      . Pregabalin  (LYRICA PO) Take 100 mg by mouth daily. Unsure of dosage      . saxagliptin HCl (ONGLYZA) 2.5 MG TABS tablet Take 5 mg by mouth daily.      Marland Kitchen tobramycin-dexamethasone (TOBRADEX) ophthalmic solution       . Vitamins-Lipotropics (LIPO-FLAVONOID PLUS) TABS Take 1 tablet by mouth 3 (three) times daily after meals.         No current facility-administered medications for this encounter.    Physical Findings: The patient is in no acute distress. Patient is alert and oriented.  weight is 239 lb 12.8 oz (108.773 kg). His oral temperature is 97.9 F (36.6 C). His blood pressure is 127/54 and his pulse is 68. His respiration is 20. Marland Kitchen   General: Well-developed, in no acute distress HEENT: Normocephalic, atraumatic Cardiovascular: Regular rate and rhythm Respiratory: Clear to auscultation bilaterally GI: Soft, nontender, normal bowel sounds Extremities: No edema present Rectal: No external lesions. Skin looks good. No findings of suspicious nodularity/masses within the anal canal.   Lab Findings: Lab Results  Component  Value Date   WBC 5.0 02/25/2013   HGB 11.7* 02/25/2013   HCT 34.5* 02/25/2013   MCV 100.8* 02/25/2013   PLT 126* 02/25/2013     Radiographic Findings: No results found.  Impression:    The patient clinically is doing fairly well, remains clinically NED. We will continue routine followup. The patient is having issues with erectile dysfunction. I discussed with him that this can be multifactorial but radiation treatment to the pelvic area can be detrimental in this regard. He has tried medical management to some degree without improvement. We discussed referral to urology. A referral request has been placed for Dr. Risa Grill.  Plan:  Followup in 6 months.    Jodelle Gross, M.D., Ph.D.

## 2013-12-18 NOTE — Progress Notes (Signed)
Follow up rectal ca s/p rad tx=05/20/12-06/28/12, no c/o rectal pain, regular bowel movements, no nausea, but has had a congestive cough for 1 month, has been on steroids  And antibiotics, no fatigue 3:55 PM

## 2013-12-19 ENCOUNTER — Telehealth: Payer: Self-pay | Admitting: *Deleted

## 2013-12-19 NOTE — Telephone Encounter (Signed)
Called patient to inform of appt. With Dr. Risa Grill on 12-30-13 @ 1 pm, lvm for a return call

## 2013-12-24 ENCOUNTER — Other Ambulatory Visit (HOSPITAL_COMMUNITY): Payer: Self-pay | Admitting: Family Medicine

## 2013-12-24 ENCOUNTER — Ambulatory Visit (HOSPITAL_COMMUNITY)
Admission: RE | Admit: 2013-12-24 | Discharge: 2013-12-24 | Disposition: A | Discharge status: Home / Self Care | Payer: Medicare Other | Source: Ambulatory Visit | Attending: Family Medicine | Admitting: Family Medicine

## 2013-12-24 DIAGNOSIS — M7989 Other specified soft tissue disorders: Secondary | ICD-10-CM | POA: Diagnosis not present

## 2013-12-24 DIAGNOSIS — C801 Malignant (primary) neoplasm, unspecified: Secondary | ICD-10-CM

## 2013-12-24 DIAGNOSIS — I4891 Unspecified atrial fibrillation: Secondary | ICD-10-CM | POA: Insufficient documentation

## 2013-12-24 DIAGNOSIS — E669 Obesity, unspecified: Secondary | ICD-10-CM | POA: Insufficient documentation

## 2013-12-24 NOTE — Progress Notes (Signed)
Right lower extremity venous duplex completed.  Right:  No evidence of DVT, superficial thrombosis, or Baker's cyst.  Left:  Negative for DVT in the common femoral vein.  

## 2013-12-29 ENCOUNTER — Telehealth: Payer: Self-pay | Admitting: Neurology

## 2013-12-29 NOTE — Telephone Encounter (Signed)
Noted, thanks!

## 2013-12-29 NOTE — Telephone Encounter (Signed)
Pt's spouse called regarding his medication. Please call pt's spouse.

## 2013-12-29 NOTE — Telephone Encounter (Signed)
I called patient's wife back and she requested for Lamictal to be called in for 100 mg instead of the 25 mg.   Rx called in #30 with 6 refills.

## 2013-12-30 ENCOUNTER — Telehealth: Payer: Self-pay | Admitting: Oncology

## 2013-12-30 ENCOUNTER — Ambulatory Visit (HOSPITAL_BASED_OUTPATIENT_CLINIC_OR_DEPARTMENT_OTHER): Payer: Medicare Other | Admitting: Oncology

## 2013-12-30 VITALS — BP 132/70 | HR 55 | Temp 97.7°F | Resp 20 | Ht 67.0 in | Wt 241.1 lb

## 2013-12-30 DIAGNOSIS — N138 Other obstructive and reflux uropathy: Secondary | ICD-10-CM | POA: Diagnosis not present

## 2013-12-30 DIAGNOSIS — N139 Obstructive and reflux uropathy, unspecified: Secondary | ICD-10-CM | POA: Diagnosis not present

## 2013-12-30 DIAGNOSIS — C211 Malignant neoplasm of anal canal: Secondary | ICD-10-CM

## 2013-12-30 DIAGNOSIS — Z85048 Personal history of other malignant neoplasm of rectum, rectosigmoid junction, and anus: Secondary | ICD-10-CM | POA: Diagnosis not present

## 2013-12-30 DIAGNOSIS — N401 Enlarged prostate with lower urinary tract symptoms: Secondary | ICD-10-CM | POA: Diagnosis not present

## 2013-12-30 DIAGNOSIS — N200 Calculus of kidney: Secondary | ICD-10-CM | POA: Diagnosis not present

## 2013-12-30 DIAGNOSIS — N529 Male erectile dysfunction, unspecified: Secondary | ICD-10-CM | POA: Diagnosis not present

## 2013-12-30 NOTE — Progress Notes (Signed)
[name] OFFICE PROGRESS NOTE   Diagnosis: Anal cancer  INTERVAL HISTORY:   [name] returns as scheduled. He feels well. No rectal pain or bleeding. He is working as a Curator daily. Good appetite. He complains of erectile dysfunction for the past few years. Viagra did not help. He has been referred to Dr. Risa Grill. He saw Dr. Alyson Ingles with right leg swelling on 12/24/2013 and a Doppler was negative for a DVT.  Objective:  Vital signs in last 24 hours: BP 132/70, pulse 55, temperature 97.7, weight 241.1   HEENT: Neck without mass Lymphatics: No cervical, supraclavicular, axillary, or inguinal nodes Resp: Lungs clear bilaterally Cardio: Regular rate and rhythm GI: No hepatosplenomegaly, nontender, no mass Vascular: The right lower leg is larger than the left side, no erythema or tenderness   Medications: I have reviewed the patient's current medications.  Assessment/Plan: 1.Squamous cell carcinoma of the anal canal, HPV positive.  2. Staging CT/PET scan with tiny lung nodules, hypermetabolic retroperitoneal, right inguinal, and left supraclavicular nodes . FNA biopsy of a left supraclavicular lymph node on 05/03/2012 confirmed metastatic squamous cell carcinoma.  -Cycle 1 of 5-fluorouracil/mitomycin-C and concurrent radiation on 05/20/2012. Cycle 2 of 5-fluorouracil/mitomycin-C on 06/24/2012 with completion of radiation on 06/28/2012.  -Restaging PET scan 08/02/2012 with resolution of the hypermetabolic anal primary and right inguinal lymph node. Persistent hypermetabolic supraclavicular and retroperitoneal nodes.  -cycle 1 of salvage Taxol/carboplatin chemotherapy on 08/30/2012 . Restaging CTs after 4 cycles of Taxol/carboplatin confirmed resolution of retroperitoneal lymphadenopathy and a decrease in the size of a previously noted left supraclavicular node. He completed cycle 6 of Taxol/carboplatin on 01/02/2013.  -A negative flexible sigmoidoscopy 04/01/2013    -Staging CT scans 10/09/2013 with stable small left supraclavicular node, no evidence of progressive metastatic disease 3. Hypermetabolic activity at the right supraglottic larynx on the PET scan 04/17/2012 with associated soft tissue fullness status post a negative ENT evaluation by Dr. Constance Holster.  4. History of aortic stenosis-status post aortic valve replacement and coronary artery bypass surgery in November 2012.  5. Mucositis following cycle 1 chemotherapy. Resolved.  6. Skin breakdown at the perineum and penis secondary to chemotherapy and radiation. Resolved.  7. 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 Bone pain after Neulasta  9. Generalized seizure 09/24/2012-negative brain CT,? Etiology. He has been evaluated by neurology and was prescribed Vimpat. Now taking Lamictal 10.Thrombocytopenia secondary chemotherapy -improved  11. Peripheral neuropathy-predating chemotherapy  12. Erectile dysfunction-he has been referred to urology  Disposition:  Mr. [name] appears stable. There is no clinical evidence for progression of the anal cancer. Restaging CT scans 10/09/2013 were negative for progressive metastatic disease. He will contact us for new symptoms. Mr. [name] will return for an office visit in 6 months.  Ladell Pier, MD  12/30/2013  2:56 PM

## 2013-12-30 NOTE — Telephone Encounter (Signed)
Gave pt appt for lab and Md for October 20th same day as Radiation appt

## 2014-01-06 DIAGNOSIS — N509 Disorder of male genital organs, unspecified: Secondary | ICD-10-CM | POA: Diagnosis not present

## 2014-01-06 DIAGNOSIS — R109 Unspecified abdominal pain: Secondary | ICD-10-CM | POA: Diagnosis not present

## 2014-01-12 ENCOUNTER — Ambulatory Visit (INDEPENDENT_AMBULATORY_CARE_PROVIDER_SITE_OTHER): Payer: Medicare Other | Admitting: Neurology

## 2014-01-12 ENCOUNTER — Encounter: Payer: Self-pay | Admitting: Neurology

## 2014-01-12 ENCOUNTER — Telehealth: Payer: Self-pay | Admitting: Neurology

## 2014-01-12 VITALS — BP 126/76 | HR 81 | Temp 98.1°F | Ht 71.0 in | Wt 238.9 lb

## 2014-01-12 DIAGNOSIS — R569 Unspecified convulsions: Secondary | ICD-10-CM

## 2014-01-12 MED ORDER — LAMOTRIGINE ER 100 MG PO TB24
ORAL_TABLET | ORAL | Status: DC
Start: 2014-01-12 — End: 2014-11-09

## 2014-01-12 NOTE — Progress Notes (Signed)
NEUROLOGY FOLLOW UP OFFICE NOTE  Kirk [name] [id number]  HISTORY OF PRESENT ILLNESS: I had the pleasure of seeing Kirk [name] in follow-up in the neurology clinic on 01/12/2014.  The patient was last seen on 3 months ago after he had a seizure on 09/24/2012.  He was taking Vimpat but having side effects of mood and personality changes.  He started Lamictal uptitration, currently on Lamictal XR 100mg /day, and has tapered off Vimpat at least 3 weeks ago.  He is doing very well, he is "back to my normal self," very active and happy.  He denies any side effects on Lamictal.  He is also taking Gabapentin and Lyrica.  He denies any further seizures or seizure-like symptoms.  He denies any headaches, dizziness, diplopia, focal numbness/tingling/weakness.    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 He had a single seizure on 09/24/12, which he is amnestic of. He and his wife reported that he was in the middle of chemotherapy and had received pain bone injections for which he took oxycodone. He recalls taking oxycodone in the morning and at 2pm, then 20 minutes after was witnessed by his co-worker to have a convulsion. This was described as starting in the right leg, progressing to the right arm, then whole-body shaking lasting 2 minutes or so. He was post-ictal after with tongue bite and urinary incontinence. He denied any post-ictal focal weakness/numbness/tingling, no headache.   He was initially started on Keppra which he took for 2 weeks, however caused significant mood side effects of crying and depression. He was switched to Vimpat and was supposed to increase dose to 100mg  BID, but was only taking 100mg  qhs. He denies any diplopia, dizziness, gait instability on the medication, however his wife is concerned  about continued mood swings, although better than with Keppra. There is no prior history of depression, and he reports that even with chemo and radiation, he was happy, however since starting seizure medication, he has been in a state of depression, with negative attitude, mood swings, anhedonia, lack of interest in his usual activities, "just not me."   He lost consciousness in 2012 and was found to have aortic stenosis, no seizure-like activity at that time. He has a history of C4-6 cervical fusion in 1995 when he had tingling in both hands, which has resolved. He has mild paresthesias in both feet. He denies any dizziness, diplopia, blurred vision, dysarthria, dysphagia, neck/back pain, bowel/bladder dysfunction. He has been taking amitriptyline for the past 25 years for sleep. He denied any sleep deprivation or alcohol the night prior to the seizure.   Epilepsy Risk Factors: He had a normal birth and early development. There is no history of febrile convulsions, CNS infections such as meningitis/encephalitis, significant traumatic brain injury, neurosurgical procedures, or family history of seizures.   Diagnostic Data: He had an MRI brain with and without contrast done 09/24/12 which I personally reviewed, unremarkable, hippocampi symmetric, with mild chronic small vessel disease. Per prior neurology notes, his EEG showed mild left-sided slowing involving the left frontal temporal region.   PAST MEDICAL HISTORY: Past Medical History  Diagnosis Date  . Elevated cholesterol   . ED (erectile dysfunction)   . Aortic stenosis   . Coronary artery disease     aortic stenosis, CAD  . Angina  6 wks. ago  . Heart murmur   . Shortness of breath   . Arthritis     1995- cerv. fusion- [facility]  . CHF (congestive heart failure)   . Rectal bleeding   . Blood in stool   . Thrombosed hemorrhoids   . ED (erectile dysfunction)   . Kidney stones     hx  . 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 Diabetes mellitus     Type 2  . History of radiation therapy 05/20/12-06/28/12    anal cancer=54gy total dose  . Seizure disorder, grand mal   . Seizures     MEDICATIONS: Current Outpatient Prescriptions on File Prior to Visit  Medication Sig Dispense Refill  . amitriptyline (ELAVIL) 25 MG tablet Take 25 mg by mouth at bedtime.       Marland Kitchen aspirin EC 81 MG tablet Take 81 mg by mouth daily.      Marland Kitchen atorvastatin (LIPITOR) 20 MG tablet Take 80 mg by mouth daily.       . benzonatate (TESSALON) 100 MG capsule as needed.      . CYANOCOBALAMIN PO Take 5,000 mcg by mouth daily.      . furosemide (LASIX) 20 MG tablet Take 40 mg by mouth daily. Takes 1 or 2      . ibuprofen (ADVIL,MOTRIN) 600 MG tablet       . losartan (COZAAR) 50 MG tablet Take 1 tablet (50 mg total) by mouth daily.  90 tablet  3  . metoprolol tartrate (LOPRESSOR) 25 MG tablet TAKE 1 TABLET BY MOUTH TWICE A DAY  180 tablet  0  . NASONEX 50 MCG/ACT nasal spray Place 2 sprays into the nose daily.       . Pregabalin (LYRICA PO) Take 100 mg by mouth daily. Unsure of dosage      . saxagliptin HCl (ONGLYZA) 2.5 MG TABS tablet Take 5 mg by mouth daily.      . ASTEPRO 0.15 % SOLN Place 2 sprays into both nostrils at bedtime.       . Cholecalciferol 1000 UNITS tablet 2,000 Units. Take 185 mg by mouth daily.      . sildenafil (REVATIO) 20 MG tablet Take 100 mg by mouth as needed.      . tobramycin-dexamethasone (TOBRADEX) ophthalmic solution       . Vitamins-Lipotropics (LIPO-FLAVONOID PLUS) TABS Take 1 tablet by mouth 3 (three) times daily after meals.         No current facility-administered medications on file prior to visit.    ALLERGIES: Allergies  Allergen Reactions  . Cat Hair Extract Shortness Of Breath and Swelling    FAMILY HISTORY: Family History  Problem Relation Age of Onset  . Anesthesia problems Neg Hx   . Hypotension Neg Hx   . Malignant hyperthermia Neg Hx   . Pseudochol deficiency Neg Hx   . Colon  cancer Neg Hx   . Cancer Mother     breast  . Heart attack Father     SOCIAL HISTORY: History   Social History  . Marital Status: Married    Spouse Name: N/A    Number of Children: N/A  . Years of Education: N/A   Occupational History  . Not on file.   Social History Main Topics  . Smoking status: Never Smoker   . Smokeless tobacco: Never Used  . Alcohol Use: Yes     Comment: RARE  . Drug Use: No  .  Sexual Activity: Not on file   Other Topics Concern  . 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, Administrator, sports    REVIEW OF SYSTEMS: Constitutional: No fevers, chills, or sweats, no generalized fatigue, change in appetite Eyes: No visual changes, double vision, eye pain Ear, nose and throat: No hearing loss, ear pain, nasal congestion, sore throat Cardiovascular: No chest pain, palpitations Respiratory:  No shortness of breath at rest or with exertion, wheezes GastrointestinaI: No nausea, vomiting, diarrhea, abdominal pain, fecal incontinence Genitourinary:  No dysuria, urinary retention or frequency Musculoskeletal:  No neck pain, back pain Integumentary: No rash, pruritus, skin lesions Neurological: as above Psychiatric: No depression, insomnia, anxiety Endocrine: No palpitations, fatigue, diaphoresis, mood swings, change in appetite, change in weight, increased thirst Hematologic/Lymphatic:  No anemia, purpura, petechiae. Allergic/Immunologic: no itchy/runny eyes, nasal congestion, recent allergic reactions, rashes  PHYSICAL EXAM: Filed Vitals:   01/12/14 0909  BP: 126/76  Pulse: 81  Temp: 98.1 F (36.7 C)   General: No acute distress Head:  Normocephalic/atraumatic Neck: supple, no paraspinal tenderness, full range of motion Heart:  Regular rate and rhythm Lungs:  Clear to auscultation bilaterally Back: No paraspinal tenderness Skin/Extremities: No rash, no edema Neurological Exam: alert and oriented to person, place, and  time. No aphasia or dysarthria. Fund of knowledge is appropriate.  Recent and remote memory are intact.  Attention and concentration are normal.    Able to name objects and repeat phrases. Cranial nerves: Pupils equal, round, reactive to light.  Fundoscopic exam unremarkable, no papilledema. Extraocular movements intact with no nystagmus. Visual fields full. Facial sensation intact. No facial asymmetry. Tongue, uvula, palate midline.  Motor: Bulk and tone normal, muscle strength 5/5 throughout with no pronator drift.  Sensation to light touch, temperature and vibration intact.  No extinction to double simultaneous stimulation.  Deep tendon reflexes 2+ throughout, toes downgoing.  Finger to nose testing intact.  Gait narrow-based and steady, able to tandem walk adequately.  Romberg negative.  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 He had one seizure on 09/24/12, by history suggestive of a partial seizure that secondarily generalized with right-sided shaking initially. Routine EEG reported slowing over the left frontotemporal region, MRI brain unremarkable. Recommendation is to continue anti-epileptic medication at this time. The mood issues he was having on Vimpat have resolved with Lamictal, which he is tolerating without side effects.  Continue Lamictal XR 100mg /day.  He is also noted to be taking Lyrica and Gabapentin for diabetic neuropathy.  He is feeling great and will continue current medications at this time.  He is aware of [place] driving laws, and he knows to stop driving after a seizure, until 6 months seizure-free. He will follow-up in 8 months or earlier if needed.  Thank you for allowing me to participate in his care.  Please do not hesitate to call for any questions or concerns.  The duration of  this appointment visit was 15 minutes of face-to-face time with the patient.  Greater than 50% of this time was spent in counseling, explanation of diagnosis, planning of further management, and coordination of care.   Ellouise Newer, M.D.

## 2014-01-12 NOTE — Telephone Encounter (Signed)
Spoke with patients spouse about medication change

## 2014-01-12 NOTE — Telephone Encounter (Signed)
Pt's spouse called/returning your call at 1:44PM. Please make sure you call the cell# [phone]

## 2014-01-12 NOTE — Patient Instructions (Signed)
1. Continue Lamictal ER 100mg  daily 2. Continue all your other medications  Seizure Precautions: 1. If medication has been prescribed for you to prevent seizures, take it exactly as directed.  Do not stop taking the medicine without talking to your doctor first, even if you have not had a seizure in a long time.   2. Avoid activities in which a seizure would cause danger to yourself or to others.  Don't operate dangerous machinery, swim alone, or climb in high or dangerous places, such as on ladders, roofs, or girders.  Do not drive unless your doctor says you may.  3. If you have any warning that you may have a seizure, lay down in a safe place where you can't hurt yourself.    4.  No driving for 6 months from last seizure, as per [facility].   Please refer to the following link on the [place] website for more information: http://[email or web address]/[email or web address]/drivingu.cfm   5.  Maintain good sleep hygiene.  6.  Contact your doctor if you have any problems that may be related to the medicine you are taking.  7.  Call 911 and bring the patient back to the ED if:        A.  The seizure lasts longer than 5 minutes.       B.  The patient doesn't awaken shortly after the seizure  C.  The patient has new problems such as difficulty seeing, speaking or moving  D.  The patient was injured during the seizure  E.  The patient has a temperature over 102 F (39C)  F.  The patient vomited and now is having trouble breathing

## 2014-01-12 NOTE — Telephone Encounter (Signed)
Pt's spouse called requesting to speak to a nurse regarding his medications.

## 2014-01-23 ENCOUNTER — Other Ambulatory Visit: Payer: Self-pay | Admitting: *Deleted

## 2014-01-23 MED ORDER — METOPROLOL TARTRATE 25 MG PO TABS: ORAL_TABLET | ORAL | Status: DC

## 2014-01-29 DIAGNOSIS — N529 Male erectile dysfunction, unspecified: Secondary | ICD-10-CM | POA: Diagnosis not present

## 2014-02-03 ENCOUNTER — Other Ambulatory Visit: Payer: Self-pay

## 2014-02-03 MED ORDER — ATORVASTATIN CALCIUM 80 MG PO TABS: 80.0000 mg | ORAL_TABLET | Freq: Every day | ORAL | Status: DC

## 2014-02-17 ENCOUNTER — Ambulatory Visit: Payer: Medicare Other | Admitting: Cardiology

## 2014-02-23 DIAGNOSIS — I10 Essential (primary) hypertension: Secondary | ICD-10-CM | POA: Diagnosis not present

## 2014-02-23 DIAGNOSIS — M7989 Other specified soft tissue disorders: Secondary | ICD-10-CM | POA: Diagnosis not present

## 2014-02-23 DIAGNOSIS — E1149 Type 2 diabetes mellitus with other diabetic neurological complication: Secondary | ICD-10-CM | POA: Diagnosis not present

## 2014-02-23 DIAGNOSIS — M25569 Pain in unspecified knee: Secondary | ICD-10-CM | POA: Diagnosis not present

## 2014-02-23 DIAGNOSIS — E785 Hyperlipidemia, unspecified: Secondary | ICD-10-CM | POA: Diagnosis not present

## 2014-02-23 DIAGNOSIS — G609 Hereditary and idiopathic neuropathy, unspecified: Secondary | ICD-10-CM | POA: Diagnosis not present

## 2014-03-03 ENCOUNTER — Ambulatory Visit (INDEPENDENT_AMBULATORY_CARE_PROVIDER_SITE_OTHER): Payer: Medicare Other | Admitting: Cardiology

## 2014-03-03 ENCOUNTER — Encounter: Payer: Self-pay | Admitting: Cardiology

## 2014-03-03 VITALS — BP 130/73 | HR 55 | Ht 71.0 in | Wt 231.0 lb

## 2014-03-03 DIAGNOSIS — I4891 Unspecified atrial fibrillation: Secondary | ICD-10-CM

## 2014-03-03 DIAGNOSIS — I359 Nonrheumatic aortic valve disorder, unspecified: Secondary | ICD-10-CM | POA: Diagnosis not present

## 2014-03-03 DIAGNOSIS — I2583 Coronary atherosclerosis due to lipid rich plaque: Secondary | ICD-10-CM | POA: Diagnosis not present

## 2014-03-03 DIAGNOSIS — Z954 Presence of other heart-valve replacement: Secondary | ICD-10-CM

## 2014-03-03 DIAGNOSIS — I2510 Atherosclerotic heart disease of native coronary artery without angina pectoris: Secondary | ICD-10-CM | POA: Diagnosis not present

## 2014-03-03 DIAGNOSIS — I350 Nonrheumatic aortic (valve) stenosis: Secondary | ICD-10-CM

## 2014-03-03 DIAGNOSIS — I482 Chronic atrial fibrillation, unspecified: Secondary | ICD-10-CM

## 2014-03-03 DIAGNOSIS — Z951 Presence of aortocoronary bypass graft: Secondary | ICD-10-CM

## 2014-03-03 DIAGNOSIS — Z952 Presence of prosthetic heart valve: Secondary | ICD-10-CM

## 2014-03-03 MED ORDER — FUROSEMIDE 20 MG PO TABS: 40.0000 mg | ORAL_TABLET | Freq: Every day | ORAL | Status: DC

## 2014-03-03 MED ORDER — METOPROLOL TARTRATE 25 MG PO TABS: ORAL_TABLET | ORAL | Status: DC

## 2014-03-03 MED ORDER — ATORVASTATIN CALCIUM 80 MG PO TABS: 80.0000 mg | ORAL_TABLET | Freq: Every day | ORAL | Status: DC

## 2014-03-03 MED ORDER — LOSARTAN POTASSIUM 50 MG PO TABS: 50.0000 mg | ORAL_TABLET | Freq: Every day | ORAL | Status: DC

## 2014-03-03 NOTE — Progress Notes (Signed)
[place]. [address], [address], [place]  [address] Phone: [phone] Fax:  [phone]  Date:  03/03/2014   ID:  Kirk [name], DOB 1945-09-23, MRN [id number]  PCP:  Vena Austria, MD   History of Present Illness: Kirk [name] "Kirk [name]" is a 68 y.o. 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 Also has a supraclavicular and peri-abdominal aortic lymph node. Seen oncology at [facility] and had chemo/rad.   He is doing well from a cardiac standpoint. No shortness of breath. Cancer is in remission he states.   No changes in medications. LDL cholesterol 78 on 12/14, triglycerides 220 range. Trying to lose a little bit of weight.   Wt Readings from Last 3 Encounters:  03/03/14 231 lb (104.781 kg)  01/12/14 238 lb 14.4 oz (108.364 kg)  12/30/13 241 lb 1.6 oz (109.362 kg)     Past Medical History  Diagnosis Date  . Elevated cholesterol   . ED (erectile dysfunction)   . Aortic stenosis   . Coronary artery disease     aortic stenosis, CAD  . Angina     6 wks. ago  . Heart murmur   . Shortness of breath   . Arthritis     1995- cerv. fusion- [facility]  . CHF (congestive heart failure)   . Rectal bleeding   . Blood in stool   . Thrombosed hemorrhoids   . ED (erectile dysfunction)   . Kidney stones     hx  . 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 Diabetes mellitus     Type 2  . History of radiation therapy 05/20/12-06/28/12    anal cancer=54gy total dose  . Seizure disorder, grand mal   . Seizures     Past Surgical History  Procedure Laterality Date  . Lithotripsy  2001 AND 2002    DR PETERSON  . C4-6 fusion  [place]  . Aortic valve replacement  08/11/2011    Procedure: AORTIC VALVE REPLACEMENT (AVR);  Surgeon: Rexene Alberts, MD;  Location: [facility];  Service: Open Heart Surgery;  Laterality: N/A;  . Coronary artery  bypass graft  08/16/2011    Procedure: CORONARY ARTERY BYPASS GRAFTING (CABG);  Surgeon: Rexene Alberts, MD;  Location: [facility];  Service: Open Heart Surgery;  Laterality: N/A;  CABG times one using left internal mammary artery  . Anal fissure repair      patient unsure of date  . Colonoscopy      Current Outpatient Prescriptions  Medication Sig Dispense Refill  . amitriptyline (ELAVIL) 25 MG tablet Take 25 mg by mouth at bedtime.       Marland Kitchen aspirin EC 81 MG tablet Take 81 mg by mouth daily.      Marland Kitchen atorvastatin (LIPITOR) 80 MG tablet Take 1 tablet (80 mg total) by mouth daily.  90 tablet  1  . Cholecalciferol 1000 UNITS tablet 2,000 Units. Take 185 mg by mouth daily.      . CYANOCOBALAMIN PO Take 5,000 mcg by mouth daily.      . furosemide (LASIX) 20 MG tablet Take 40 mg by mouth daily. Takes 1 or 2      . ibuprofen (ADVIL,MOTRIN) 600 MG tablet       . LamoTRIgine 100 MG TB24 1 tab daily  30 tablet  11  . losartan (COZAAR) 50 MG tablet Take 1 tablet (50 mg total) by mouth daily.  90 tablet  3  . meloxicam (MOBIC) 15 MG tablet Take 15 mg by mouth daily.      . metoprolol tartrate (LOPRESSOR) 25 MG tablet TAKE 1 TABLET BY MOUTH TWICE A DAY  180 tablet  0  . Pregabalin (LYRICA PO) Take 100 mg by mouth daily. Unsure of dosage      . saxagliptin HCl (ONGLYZA) 5 MG TABS tablet Take 5 mg by mouth daily.      . Vitamins-Lipotropics (LIPO-FLAVONOID PLUS) TABS Take 1 tablet by mouth 3 (three) times daily after meals.         No current facility-administered medications for this visit.    Allergies:    Allergies  Allergen Reactions  . Cat Hair Extract Shortness Of Breath and Swelling    Social History:  The patient  reports that he has never smoked. He has never used smokeless tobacco. He reports that he drinks alcohol. He reports that he does not use illicit drugs.  Kirk [name] is his wife. [name].  ROS:  Please see the history of present illness.   Denies any anginal symptoms, no syncope, no fevers,  no chills, no orthopnea. Hair is growing back.    PHYSICAL EXAM: VS:  BP 130/73  Pulse 55  Ht 5\' 11"  (1.803 m)  Wt 231 lb (104.781 kg)  BMI 32.23 kg/m2 Well nourished, well developed, in no acute distress HEENT: normal Neck: no JVD Cardiac:  normal S1, S2; RRR; soft systolic murmur Lungs:  clear to auscultation bilaterally, no wheezing, rhonchi or rales Abd: soft, nontender, no hepatomegalyoverweight Ext: no edema Skin: warm and dry Neuro: no focal abnormalities noted  EKG:  03/03/14-sinus bradycardia rate 55, incomplete left bundle branch block     ASSESSMENT AND PLAN:  1. Coronary artery disease-status post bypass. Doing very well, no anginal symptoms. Bypass was done in the setting of aortic valve replacement. 2. Aortic valve replacement-bioprosthetic-doing very well. Soft murmur heard. 3. Hyperlipidemia-excellent lipid results however triglycerides are mildly elevated. Weight loss, diet. Continue with statin therapy. 4. Obesity-I'm fine with him losing 5-10 pounds as well. 5. 1 year follow up.   Signed, [name], MD [facility]  03/03/2014 9:05 AM

## 2014-03-03 NOTE — Patient Instructions (Signed)
The current medical regimen is effective;  continue present plan and medications.  Follow up in 1 year with Dr Skains.  You will receive a letter in the mail 2 months before you are due.  Please call us when you receive this letter to schedule your follow up appointment.  

## 2014-04-02 DIAGNOSIS — N529 Male erectile dysfunction, unspecified: Secondary | ICD-10-CM | POA: Diagnosis not present

## 2014-04-06 DIAGNOSIS — R609 Edema, unspecified: Secondary | ICD-10-CM | POA: Diagnosis not present

## 2014-04-06 DIAGNOSIS — M25569 Pain in unspecified knee: Secondary | ICD-10-CM | POA: Diagnosis not present

## 2014-04-06 DIAGNOSIS — E7800 Pure hypercholesterolemia, unspecified: Secondary | ICD-10-CM | POA: Diagnosis not present

## 2014-04-16 DIAGNOSIS — N289 Disorder of kidney and ureter, unspecified: Secondary | ICD-10-CM | POA: Diagnosis not present

## 2014-06-15 DIAGNOSIS — Z23 Encounter for immunization: Secondary | ICD-10-CM | POA: Diagnosis not present

## 2014-06-15 DIAGNOSIS — E860 Dehydration: Secondary | ICD-10-CM | POA: Diagnosis not present

## 2014-06-17 ENCOUNTER — Telehealth: Payer: Self-pay | Admitting: *Deleted

## 2014-06-17 NOTE — Telephone Encounter (Signed)
Left VM to cancel lab tomorrow. OV only

## 2014-06-18 ENCOUNTER — Telehealth: Payer: Self-pay | Admitting: Oncology

## 2014-06-18 ENCOUNTER — Other Ambulatory Visit: Payer: Medicare Other

## 2014-06-18 ENCOUNTER — Ambulatory Visit (HOSPITAL_BASED_OUTPATIENT_CLINIC_OR_DEPARTMENT_OTHER): Payer: Medicare Other | Admitting: Oncology

## 2014-06-18 ENCOUNTER — Telehealth: Payer: Self-pay | Admitting: *Deleted

## 2014-06-18 ENCOUNTER — Ambulatory Visit
Admission: RE | Admit: 2014-06-18 | Discharge: 2014-06-18 | Disposition: A | Discharge status: Home / Self Care | Payer: Medicare Other | Source: Ambulatory Visit | Attending: Radiation Oncology | Admitting: Radiation Oncology

## 2014-06-18 ENCOUNTER — Encounter: Payer: Self-pay | Admitting: Radiation Oncology

## 2014-06-18 VITALS — BP 144/61 | HR 63 | Temp 98.6°F | Resp 20 | Wt 239.0 lb

## 2014-06-18 VITALS — BP 147/67 | HR 52 | Temp 97.8°F | Resp 18 | Ht 71.0 in | Wt 240.2 lb

## 2014-06-18 DIAGNOSIS — D6959 Other secondary thrombocytopenia: Secondary | ICD-10-CM

## 2014-06-18 DIAGNOSIS — C211 Malignant neoplasm of anal canal: Secondary | ICD-10-CM

## 2014-06-18 DIAGNOSIS — I2510 Atherosclerotic heart disease of native coronary artery without angina pectoris: Secondary | ICD-10-CM

## 2014-06-18 DIAGNOSIS — Z85048 Personal history of other malignant neoplasm of rectum, rectosigmoid junction, and anus: Secondary | ICD-10-CM

## 2014-06-18 NOTE — Telephone Encounter (Signed)
Called patient to see if he can come in early for fu today, spoke with patient's wife and she will contact him and let me know.

## 2014-06-18 NOTE — Progress Notes (Signed)
  [facility] OFFICE PROGRESS NOTE   Diagnosis: Anal cancer  INTERVAL HISTORY:   Kirk [name] returns as scheduled. He feels well. No palpable lymph nodes. No difficulty with bowel function. Occasional bleeding when he is "constipating ". Injection therapy has worked for erectile dysfunction.  Objective:  Vital signs in last 24 hours:  Blood pressure 147/67, pulse 52, temperature 97.8 F (36.6 C), temperature source Oral, resp. rate 18, height 5\' 11"  (1.803 m), weight 240 lb 3.2 oz (108.954 kg), SpO2 100.00%.    HEENT: Neck without mass Lymphatics: No cervical, supra-clavicular, axillary, or inguinal nodes Resp: Lungs clear bilaterally Cardio: Regular rate and rhythm, 2/6 systolic murmur GI: No hepatosplenomegaly, nontender, no mass Vascular: No leg edema  Rectal: Anal verge and anal canal without a palpable mass.   Medications: I have reviewed the patient's current medications.  Assessment/Plan: 1.Squamous cell carcinoma of the anal canal, HPV positive.  2. Staging CT/PET scan with tiny lung nodules, hypermetabolic retroperitoneal, right inguinal, and left supraclavicular nodes . FNA biopsy of a left supraclavicular lymph node on 05/03/2012 confirmed metastatic squamous cell carcinoma.  -Cycle 1 of 5-fluorouracil/mitomycin-C and concurrent radiation on 05/20/2012. Cycle 2 of 5-fluorouracil/mitomycin-C on 06/24/2012 with completion of radiation on 06/28/2012.  -Restaging PET scan 08/02/2012 with resolution of the hypermetabolic anal primary and right inguinal lymph node. Persistent hypermetabolic supraclavicular and retroperitoneal nodes.  -cycle 1 of salvage Taxol/carboplatin chemotherapy on 08/30/2012 . Restaging CTs after 4 cycles of Taxol/carboplatin confirmed resolution of retroperitoneal lymphadenopathy and a decrease in the size of a previously noted left supraclavicular node. He completed cycle 6 of Taxol/carboplatin on 01/02/2013.  -A negative flexible  sigmoidoscopy 04/01/2013  -Staging CT scans 10/09/2013 with stable small left supraclavicular node, no evidence of progressive metastatic disease  3. Hypermetabolic activity at the right supraglottic larynx on the PET scan 04/17/2012 with associated soft tissue fullness status post a negative ENT evaluation by Dr. Constance Holster.  4. History of aortic stenosis-status post aortic valve replacement and coronary artery bypass surgery in November 2012.  5. Mucositis following cycle 1 chemotherapy. Resolved.  6. Skin breakdown at the perineum and penis secondary to chemotherapy and radiation. Resolved.  7. 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 Bone pain after Neulasta  9. Generalized seizure 09/24/2012-negative brain CT,? Etiology. He has been evaluated by neurology and was prescribed Vimpat. Now taking Lamictal  10.Thrombocytopenia secondary chemotherapy -improved  11. Peripheral neuropathy-predating chemotherapy  12. Erectile dysfunction-followed by Dr. Risa Grill   Disposition:  [name] remains in clinical remission from anal cancer. He will return for an office visit in 6 months. He will contact us in the interim for new symptoms.  Betsy Coder, MD  06/18/2014  9:22 AM

## 2014-06-18 NOTE — Telephone Encounter (Signed)
gv and printed appt sched adn avs for pt for april 2016

## 2014-06-18 NOTE — Progress Notes (Signed)
Follow up s/p rad tx 05/20/12-06/28/12, patient denys pain, nausea, appetite good, bowel movements mostly regular occasional constipation and bleeds some from straining but by next morning has  Resolved and stopped, saw Dr. Benay Spice this morning,   3:55 PM

## 2014-06-19 ENCOUNTER — Encounter: Payer: Self-pay | Admitting: Radiation Oncology

## 2014-06-19 NOTE — Progress Notes (Signed)
Radiation Oncology         [phone] ________________________________  Name: Kirk [name] MRN: [id number]  Date: 06/18/2014  DOB: April 14, 1946  Follow-Up Visit Note  CC: Vena Austria, MD  Maury Dus, MD  Diagnosis:   Squamous cell carcinoma of the anal canal   Interval Since Last Radiation:  The patient completed radiation treatment on 06/28/2012  Narrative:  The patient returns today for routine follow-up.  He states that he is doing well. He saw Dr. Benay Spice earlier today in medical oncology. He has some mild bleeding associated with constipation. Otherwise no blood corrected. No pain in the anal/pelvic region.                               ALLERGIES:  is allergic to cat hair extract and atorvastatin.  Meds: Current Outpatient Prescriptions  Medication Sig Dispense Refill  . amitriptyline (ELAVIL) 25 MG tablet Take 25 mg by mouth at bedtime.       Marland Kitchen aspirin EC 81 MG tablet Take 81 mg by mouth daily.      Marland Kitchen atorvastatin (LIPITOR) 80 MG tablet Take 1 tablet (80 mg total) by mouth daily.  90 tablet  3  . Cholecalciferol 1000 UNITS tablet 2,000 Units. Take 185 mg by mouth daily.      . CYANOCOBALAMIN PO Take 5,000 mcg by mouth daily.      . furosemide (LASIX) 20 MG tablet Take 2 tablets (40 mg total) by mouth daily. Takes 1 or 2  90 tablet  3  . ibuprofen (ADVIL,MOTRIN) 600 MG tablet as needed.       . LamoTRIgine 100 MG TB24 1 tab daily  30 tablet  11  . losartan (COZAAR) 50 MG tablet Take 1 tablet (50 mg total) by mouth daily.  90 tablet  3  . meloxicam (MOBIC) 15 MG tablet Take 15 mg by mouth daily.      . metoprolol tartrate (LOPRESSOR) 25 MG tablet TAKE 1 TABLET BY MOUTH TWICE A DAY  180 tablet  3  . Pregabalin (LYRICA PO) Take 100 mg by mouth daily. Unsure of dosage      . saxagliptin HCl (ONGLYZA) 5 MG TABS tablet Take 5 mg by mouth daily.      . Vitamins-Lipotropics (LIPO-FLAVONOID PLUS) TABS Take 1 tablet by mouth 3 (three) times daily after meals.          No current facility-administered medications for this encounter.    Physical Findings: The patient is in no acute distress. Patient is alert and oriented.  weight is 239 lb (108.41 kg). His oral temperature is 98.6 F (37 C). His blood pressure is 144/61 and his pulse is 63. His respiration is 20. .   With physical exam completed in medical oncology today  Lab Findings: Lab Results  Component Value Date   WBC 5.0 02/25/2013   HGB 11.7* 02/25/2013   HCT 34.5* 02/25/2013   MCV 100.8* 02/25/2013   PLT 126* 02/25/2013     Radiographic Findings: No results found.  Impression:    The patient is doing well. Clinically NED.  Plan:   I will stagger visits with medical oncology. I will see him in 3 months and then we will proceed with every 6 month followup visit.  I spent 10 minutes with the patient today, the majority of which was spent counseling the patient on the diagnosis of cancer and coordinating care.   Jenny Reichmann  S. Lisbeth Renshaw, M.D., Ph.D.

## 2014-07-29 ENCOUNTER — Other Ambulatory Visit: Payer: Self-pay | Admitting: Neurology

## 2014-08-05 DIAGNOSIS — G629 Polyneuropathy, unspecified: Secondary | ICD-10-CM | POA: Diagnosis not present

## 2014-08-05 DIAGNOSIS — J209 Acute bronchitis, unspecified: Secondary | ICD-10-CM | POA: Diagnosis not present

## 2014-08-17 DIAGNOSIS — G629 Polyneuropathy, unspecified: Secondary | ICD-10-CM | POA: Diagnosis not present

## 2014-08-17 DIAGNOSIS — G4700 Insomnia, unspecified: Secondary | ICD-10-CM | POA: Diagnosis not present

## 2014-08-17 DIAGNOSIS — I10 Essential (primary) hypertension: Secondary | ICD-10-CM | POA: Diagnosis not present

## 2014-08-17 DIAGNOSIS — I2510 Atherosclerotic heart disease of native coronary artery without angina pectoris: Secondary | ICD-10-CM | POA: Diagnosis not present

## 2014-08-17 DIAGNOSIS — E780 Pure hypercholesterolemia: Secondary | ICD-10-CM | POA: Diagnosis not present

## 2014-08-17 DIAGNOSIS — E1142 Type 2 diabetes mellitus with diabetic polyneuropathy: Secondary | ICD-10-CM | POA: Diagnosis not present

## 2014-08-17 DIAGNOSIS — C211 Malignant neoplasm of anal canal: Secondary | ICD-10-CM | POA: Diagnosis not present

## 2014-09-11 DIAGNOSIS — B029 Zoster without complications: Secondary | ICD-10-CM | POA: Diagnosis not present

## 2014-09-15 ENCOUNTER — Telehealth: Payer: Self-pay | Admitting: Neurology

## 2014-09-15 NOTE — Telephone Encounter (Signed)
Pt resch appt from 09-17-14 to 10-28-14 due  him being sick

## 2014-09-17 ENCOUNTER — Ambulatory Visit: Admission: RE | Admit: 2014-09-17 | Payer: Medicare Other | Source: Ambulatory Visit | Admitting: Radiation Oncology

## 2014-09-17 ENCOUNTER — Ambulatory Visit: Payer: Medicare Other | Admitting: Neurology

## 2014-10-22 ENCOUNTER — Ambulatory Visit: Payer: Medicare Other | Admitting: Radiation Oncology

## 2014-10-26 ENCOUNTER — Encounter: Payer: Self-pay | Admitting: Radiation Oncology

## 2014-10-26 ENCOUNTER — Ambulatory Visit
Admission: RE | Admit: 2014-10-26 | Discharge: 2014-10-26 | Disposition: A | Discharge status: Home / Self Care | Payer: Medicare Other | Source: Ambulatory Visit | Attending: Radiation Oncology | Admitting: Radiation Oncology

## 2014-10-26 VITALS — BP 137/65 | HR 55 | Temp 98.5°F | Resp 20 | Ht 71.0 in | Wt 234.7 lb

## 2014-10-26 DIAGNOSIS — C211 Malignant neoplasm of anal canal: Secondary | ICD-10-CM | POA: Diagnosis not present

## 2014-10-26 NOTE — Progress Notes (Signed)
Radiation Oncology         [phone] ________________________________  Name: Kirk [name] MRN: [id number]  Date: 10/26/2014  DOB: 06-22-1946  Follow-Up Visit Note  CC: Vena Austria, MD  Maury Dus, MD  Diagnosis:   Squamous cell carcinoma of the anal canal   Interval Since Last Radiation:  The patient completed radiation treatment on 06/28/2012  Narrative:  The patient returns today for routine follow-up.  He states that he is doing well. He has been recovering from shingles which caused some severe irritation especially in the right posterior/anterior chest. He notes no significant changes in bowel movements. No pelvic pain. The patient has noticed some continued occasional bright red blood. He relates this to part her stools.  ALLERGIES:  is allergic to cat hair extract and atorvastatin.  Meds: Current Outpatient Prescriptions  Medication Sig Dispense Refill  . amitriptyline (ELAVIL) 25 MG tablet Take 25 mg by mouth at bedtime.     Marland Kitchen aspirin EC 81 MG tablet Take 81 mg by mouth daily.    Marland Kitchen atorvastatin (LIPITOR) 80 MG tablet Take 1 tablet (80 mg total) by mouth daily. 90 tablet 3  . Cholecalciferol 1000 UNITS tablet 2,000 Units. Take 185 mg by mouth daily.    . CYANOCOBALAMIN PO Take 5,000 mcg by mouth daily.    . furosemide (LASIX) 20 MG tablet Take 2 tablets (40 mg total) by mouth daily. Takes 1 or 2 90 tablet 3  . ibuprofen (ADVIL,MOTRIN) 600 MG tablet as needed.     . LamoTRIgine 100 MG TB24 1 tab daily 30 tablet 11  . losartan (COZAAR) 50 MG tablet Take 1 tablet (50 mg total) by mouth daily. 90 tablet 3  . meloxicam (MOBIC) 15 MG tablet Take 15 mg by mouth daily.    . metoprolol tartrate (LOPRESSOR) 25 MG tablet TAKE 1 TABLET BY MOUTH TWICE A DAY 180 tablet 3  . Pregabalin (LYRICA PO) Take 100 mg by mouth daily. Unsure of dosage    . saxagliptin HCl (ONGLYZA) 5 MG TABS tablet Take 5 mg by mouth daily.    . Vitamins-Lipotropics (LIPO-FLAVONOID PLUS) TABS  Take 1 tablet by mouth 3 (three) times daily after meals.       No current facility-administered medications for this encounter.    Physical Findings: The patient is in no acute distress. Patient is alert and oriented.  height is 5\' 11"  (1.803 m) and weight is 234 lb 11.2 oz (106.459 kg). His oral temperature is 98.5 F (36.9 C). His blood pressure is 137/65 and his pulse is 55. His respiration is 20. Marland Kitchen   No inguinal lymphadenopathy. On digital rectal exam, no suspicious masses/nodularity felt. No blood on exam glove.  Lab Findings: Lab Results  Component Value Date   WBC 5.0 02/25/2013   HGB 11.7* 02/25/2013   HCT 34.5* 02/25/2013   MCV 100.8* 02/25/2013   PLT 126* 02/25/2013     Radiographic Findings: No results found.  Impression:    The patient is doing well. Clinically NED.  Plan:   The patient will be seen in our clinic in 6 months. I will also refer the patient back to Dr. Deatra Ina for anoscopy, especially given his complaints of bleeding. I discussed with the patient that I did not feel anything suspicious on exam and I do not believe that there is any reason for concern. However a repeat exam I believe would be reasonable. The patient is in agreement.  I spent 15 minutes with the  patient today, the majority of which was spent counseling the patient on the diagnosis of cancer and coordinating care.   Jodelle Gross, M.D., Ph.D.

## 2014-10-26 NOTE — Progress Notes (Signed)
Follow up s/p rad txs anal ca, 05/20/12-06/28/12, regular bowel mobements but doe strain at times and real  Bright red blood last a few days ago, just getting over shingles on his right rib area and back, appetite good ,  8:21 AM

## 2014-10-28 ENCOUNTER — Ambulatory Visit (INDEPENDENT_AMBULATORY_CARE_PROVIDER_SITE_OTHER): Payer: Medicare Other | Admitting: Neurology

## 2014-10-28 ENCOUNTER — Encounter: Payer: Self-pay | Admitting: Neurology

## 2014-10-28 VITALS — BP 142/72 | HR 56 | Ht 71.0 in | Wt 234.0 lb

## 2014-10-28 DIAGNOSIS — R569 Unspecified convulsions: Secondary | ICD-10-CM | POA: Diagnosis not present

## 2014-10-28 NOTE — Progress Notes (Signed)
NEUROLOGY FOLLOW UP OFFICE NOTE  DESTEN MANOR [id number]  HISTORY OF PRESENT ILLNESS: I had the pleasure of seeing Kirk [name] in follow-up in the neurology clinic on 10/28/2014.  The patient was last seen 9 months ago after he had a seizure on 09/24/2012. He had mood and personality changes on Vimpat. He was switched to Lamictal with significant improvement in mood symptoms, currently on Lamotrigine ER 100mg /day. No further seizure or seizure-like symptoms since January 2014. He is also taking Lyrica for many years. He denies any headaches, dizziness, diplopia, focal weakness/numbness/tingling. He had a bout of shingles over the right side of his trunk, chest, back, and top of his head, which has resolved. Mood is good. He feels his memory is getting better.   HPI: This is a very pleasant 69 yo RH man with a history of diabetes, hyperlipidemia, CAD, aortic valve replacement, metastatic anal cancer status post chemotherapy and radiation completed in April 2014, currently in clinical remission. He had a single seizure on 09/24/12, which he is amnestic of. He and his wife reported that he was in the middle of chemotherapy and had received pain bone injections for which he took oxycodone. He recalls taking oxycodone in the morning and at 2pm, then 20 minutes after was witnessed by his co-worker to have a convulsion. This was described as starting in the right leg, progressing to the right arm, then whole-body shaking lasting 2 minutes or so. He was post-ictal after with tongue bite and urinary incontinence. He denied any post-ictal focal weakness/numbness/tingling, no headache.   He was initially started on Keppra which he took for 2 weeks, however caused significant mood side effects of crying and depression. He was switched to Vimpat and was supposed to increase dose to 100mg  BID, but was only taking 100mg  qhs. He denies any diplopia, dizziness, gait instability on the medication, however his wife is  concerned about continued mood swings, although better than with Keppra. There is no prior history of depression, and he reports that even with chemo and radiation, he was happy, however since starting seizure medication, he has been in a state of depression, with negative attitude, mood swings, anhedonia, lack of interest in his usual activities, "just not me." Improved with Lamictal.  He lost consciousness in 2012 and was found to have aortic stenosis, no seizure-like activity at that time. He has a history of C4-6 cervical fusion in 1995 when he had tingling in both hands, which has resolved. He has mild paresthesias in both feet. He denies any dizziness, diplopia, blurred vision, dysarthria, dysphagia, neck/back pain, bowel/bladder dysfunction. He has been taking amitriptyline for the past 25 years for sleep. He denied any sleep deprivation or alcohol the night prior to the seizure.   Epilepsy Risk Factors: He had a normal birth and early development. There is no history of febrile convulsions, CNS infections such as meningitis/encephalitis, significant traumatic brain injury, neurosurgical procedures, or family history of seizures.   Diagnostic Data: He had an MRI brain with and without contrast done 09/24/12 which I personally reviewed, unremarkable, hippocampi symmetric, with mild chronic small vessel disease. Per prior neurology notes, his EEG showed mild left-sided slowing involving the left frontal temporal region.   PAST MEDICAL HISTORY: Past Medical History  Diagnosis Date  . Elevated cholesterol   . ED (erectile dysfunction)   . Aortic stenosis   . Coronary artery disease     aortic stenosis, CAD  . Angina     6 wks. ago  .  Heart murmur   . Shortness of breath   . Arthritis     1995- cerv. fusion- [facility]  . CHF (congestive heart failure)   . Rectal bleeding   . Blood in stool   . Thrombosed hemorrhoids   . ED (erectile dysfunction)   . Kidney stones     hx  . Cancer 04/09/12     Rectum bx=invasive squamous cell carcinoma  . Diabetes mellitus     Type 2  . History of radiation therapy 05/20/12-06/28/12    anal cancer=54gy total dose  . Seizure disorder, grand mal   . Seizures     MEDICATIONS: Current Outpatient Prescriptions on File Prior to Visit  Medication Sig Dispense Refill  . amitriptyline (ELAVIL) 25 MG tablet Take 25 mg by mouth at bedtime.     Marland Kitchen aspirin EC 81 MG tablet Take 81 mg by mouth daily.    Marland Kitchen atorvastatin (LIPITOR) 80 MG tablet Take 1 tablet (80 mg total) by mouth daily. 90 tablet 3  . Cholecalciferol 1000 UNITS tablet 2,000 Units. Take 185 mg by mouth daily.    . furosemide (LASIX) 20 MG tablet Take 2 tablets (40 mg total) by mouth daily. Takes 1 or 2 90 tablet 3  . ibuprofen (ADVIL,MOTRIN) 600 MG tablet as needed.     . LamoTRIgine 100 MG TB24 1 tab daily 30 tablet 11  . losartan (COZAAR) 50 MG tablet Take 1 tablet (50 mg total) by mouth daily. 90 tablet 3  . meloxicam (MOBIC) 15 MG tablet Take 15 mg by mouth daily.    . metoprolol tartrate (LOPRESSOR) 25 MG tablet TAKE 1 TABLET BY MOUTH TWICE A DAY 180 tablet 3  . Pregabalin (LYRICA PO) Take 100 mg by mouth daily. Unsure of dosage    . saxagliptin HCl (ONGLYZA) 5 MG TABS tablet Take 5 mg by mouth daily.    . Vitamins-Lipotropics (LIPO-FLAVONOID PLUS) TABS Take 1 tablet by mouth 3 (three) times daily after meals.       No current facility-administered medications on file prior to visit.    ALLERGIES: Allergies  Allergen Reactions  . Cat Hair Extract Shortness Of Breath and Swelling  . Atorvastatin Other (See Comments)    Pt has muscle ache on Atorvastatin but IS ABLE TO TAKE LIPITOR without any issues    FAMILY HISTORY: Family History  Problem Relation Age of Onset  . Anesthesia problems Neg Hx   . Hypotension Neg Hx   . Malignant hyperthermia Neg Hx   . Pseudochol deficiency Neg Hx   . Colon cancer Neg Hx   . Cancer Mother     breast  . Heart attack Father     SOCIAL  HISTORY: History   Social History  . Marital Status: Married    Spouse Name: N/A  . Number of Children: N/A  . Years of Education: N/A   Occupational History  . Not on file.   Social History Main Topics  . Smoking status: Never Smoker   . Smokeless tobacco: Never Used  . Alcohol Use: Yes     Comment: RARE  . Drug Use: No  . Sexual Activity: Not on file   Other Topics Concern  . 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, Administrator, sports    REVIEW OF SYSTEMS: Constitutional: No fevers, chills, or sweats, no generalized fatigue, change in appetite Eyes: No visual changes, double vision, eye pain Ear, nose and throat: No hearing loss,  ear pain, nasal congestion, sore throat Cardiovascular: No chest pain, palpitations Respiratory:  No shortness of breath at rest or with exertion, wheezes GastrointestinaI: No nausea, vomiting, diarrhea, abdominal pain, fecal incontinence Genitourinary:  No dysuria, urinary retention or frequency Musculoskeletal:  No neck pain, back pain Integumentary: No rash, pruritus, skin lesions Neurological: as above Psychiatric: No depression, insomnia, anxiety Endocrine: No palpitations, fatigue, diaphoresis, mood swings, change in appetite, change in weight, increased thirst Hematologic/Lymphatic:  No anemia, purpura, petechiae. Allergic/Immunologic: no itchy/runny eyes, nasal congestion, recent allergic reactions, rashes  PHYSICAL EXAM: Filed Vitals:   10/28/14 0852  BP: 142/72  Pulse: 56   General: No acute distress Head:  Normocephalic/atraumatic Neck: supple, no paraspinal tenderness, full range of motion Heart:  Regular rate and rhythm Lungs:  Clear to auscultation bilaterally Back: No paraspinal tenderness Skin/Extremities: No rash, no edema Neurological Exam: alert and oriented to person, place, and time. No aphasia or dysarthria. Fund of knowledge is appropriate.  Recent and remote memory are intact.   Attention and concentration are normal.    Able to name objects and repeat phrases. Cranial nerves: Pupils equal, round, reactive to light.  Fundoscopic exam unremarkable, no papilledema. Extraocular movements intact with no nystagmus. Visual fields full. Facial sensation intact. No facial asymmetry. Tongue, uvula, palate midline.  Motor: Bulk and tone normal, muscle strength 5/5 throughout with no pronator drift.  Sensation to light touch intact.  No extinction to double simultaneous stimulation.  Deep tendon reflexes 1+ throughout, toes downgoing.  Finger to nose testing intact.  Gait narrow-based and steady, able to tandem walk adequately.  Romberg negative.  IMPRESSION: This is a very pleasant 69 yo RH man with a history of diabetes, hyperlipidemia, CAD, aortic valve replacement, metastatic anal cancer status post chemotherapy and radiation completed in April 2014, currently in clinical remission.. He had one seizure on 09/24/12, by history suggestive of a partial seizure that secondarily generalized with right-sided shaking initially. Routine EEG reported slowing over the left frontotemporal region, MRI brain unremarkable. He has been seizure-free for 2 years and is interested in tapering off Lamictal. We discussed risks of seizure recurrence, I would recommend repeating an EEG and if unremarkable, we can start slowly tapering off Lamictal and monitor him closely during this time period. Recommend he should not drive for 6 months during the medication taper. He would like to discuss this with his wife and will let us know their decision.  He is aware of [place] driving laws, and he knows to stop driving after a seizure, until 6 months seizure-free. He will follow-up in 1 year or earlier if needed.  Thank you for allowing me to participate in his care.  Please do not hesitate to call for any questions or concerns.  The duration of this appointment visit was 15 minutes of face-to-face time with the patient.   Greater than 50% of this time was spent in counseling, explanation of diagnosis, planning of further management, and coordination of care.   Ellouise Newer, M.D.   CC: Dr. Alyson Ingles

## 2014-10-28 NOTE — Patient Instructions (Addendum)
1. Continue all your current medications 2. If we decide to start reducing Lamictal, we will do an EEG then decide on plan depending on results 3. Follow-up in 1 year, call our office for any changes

## 2014-11-03 ENCOUNTER — Ambulatory Visit (INDEPENDENT_AMBULATORY_CARE_PROVIDER_SITE_OTHER): Payer: Medicare Other | Admitting: Neurology

## 2014-11-03 DIAGNOSIS — R569 Unspecified convulsions: Secondary | ICD-10-CM

## 2014-11-05 ENCOUNTER — Telehealth: Payer: Self-pay | Admitting: *Deleted

## 2014-11-05 NOTE — Telephone Encounter (Signed)
I received a prior authorization from [employer] re pt's Lipitor RX.  I called to speak with the patient about his Lipitor with the understanding he has had muscle aches in the past on atorvastatin.  I needed to know if the pt has tried any other statins previously before calling his insurance company.  Spoke with wife who was very unfriendly in her response as to when I could reach pt to talk to him about his medication.  She informed me that the patient does not need a PA, that she has a card from [employer] for a $30 copay and she will get it straight with [facility].  She thanked me for my time but stated she will handle [facility] and go talk to them herself.

## 2014-11-05 NOTE — Procedures (Signed)
ELECTROENCEPHALOGRAM REPORT  Date of Study: 11/03/2014  Patient's Name: Kirk [name] MRN: [id number] Date of Birth: July 28, 1946  Referring Provider: Dr. Ellouise Newer  Clinical History: This is a 69 year old man with a single seizure on 09/24/12. Routine EEG reported slowing over the left frontotemporal region. He is interested in tapering off medication.  Medications: Lamictal, Lyrica, amitriptyline  Technical Summary: A multichannel digital EEG recording measured by the international 10-20 system with electrodes applied with paste and impedances below 5000 ohms performed in our laboratory with EKG monitoring in an awake and asleep patient.  Hyperventilation and photic stimulation were performed.  The digital EEG was referentially recorded, reformatted, and digitally filtered in a variety of bipolar and referential montages for optimal display.    Description: The patient is awake and asleep during the recording.  During maximal wakefulness, there is a symmetric, medium voltage 12 Hz posterior dominant rhythm that attenuates with eye opening.  The record is symmetric.  During drowsiness and sleep, there is an increase in theta slowing of the background, with shifting asymmetry seen over the bilateral temporal regions, left greater than right, at times sharply contoured without clear epileptogenic potential.  Vertex waves and symmetric sleep spindles were seen.  Hyperventilation and photic stimulation did not elicit any abnormalities.  There were no epileptiform discharges or electrographic seizures seen.    EKG lead was unremarkable.  Impression: This awake and asleep EEG is within normal limits.  Clinical Correlation: A normal EEG does not exclude a clinical diagnosis of epilepsy.  If further clinical questions remain, prolonged EEG may be helpful.  Clinical correlation is advised.   Ellouise Newer, M.D.

## 2014-11-09 ENCOUNTER — Telehealth: Payer: Self-pay | Admitting: Neurology

## 2014-11-09 MED ORDER — LAMOTRIGINE ER 25 MG PO TB24: ORAL_TABLET | ORAL | Status: DC

## 2014-11-09 NOTE — Telephone Encounter (Signed)
Spoke to patient's wife regarding EEG results, EEG is normal. Discussed plan to start tapering Lamictal, he is taking Lamictal XR 100mg /day. Would start by reducing to 50mg /day for 2 weeks, then 25mg /day for 2 weeks, then 25mg  every other day for 2 weeks then stop. Discussed risk for breakthrough seizure with any medication adjustment, recommend no driving for 6 months. Wife expressed understanding and would like to proceed with taper. Rx for 25mg  tabs sent to pharmacy.

## 2014-11-20 DIAGNOSIS — N529 Male erectile dysfunction, unspecified: Secondary | ICD-10-CM | POA: Diagnosis not present

## 2014-11-24 DIAGNOSIS — S0500XA Injury of conjunctiva and corneal abrasion without foreign body, unspecified eye, initial encounter: Secondary | ICD-10-CM | POA: Diagnosis not present

## 2014-12-17 ENCOUNTER — Ambulatory Visit (HOSPITAL_BASED_OUTPATIENT_CLINIC_OR_DEPARTMENT_OTHER): Payer: Medicare Other | Admitting: Oncology

## 2014-12-17 ENCOUNTER — Telehealth: Payer: Self-pay | Admitting: Oncology

## 2014-12-17 VITALS — BP 125/67 | HR 71 | Temp 97.9°F | Resp 18 | Ht 71.0 in | Wt 227.9 lb

## 2014-12-17 DIAGNOSIS — G609 Hereditary and idiopathic neuropathy, unspecified: Secondary | ICD-10-CM

## 2014-12-17 DIAGNOSIS — D6959 Other secondary thrombocytopenia: Secondary | ICD-10-CM | POA: Diagnosis not present

## 2014-12-17 DIAGNOSIS — Z85048 Personal history of other malignant neoplasm of rectum, rectosigmoid junction, and anus: Secondary | ICD-10-CM | POA: Diagnosis not present

## 2014-12-17 DIAGNOSIS — C211 Malignant neoplasm of anal canal: Secondary | ICD-10-CM

## 2014-12-17 NOTE — Progress Notes (Signed)
  [facility] OFFICE PROGRESS NOTE   Diagnosis: Anal cancer  INTERVAL HISTORY:   He returns as scheduled. He feels well. No complaint. No bleeding. He reports an intentional weight loss with dieting.  Objective:  Vital signs in last 24 hours:  Blood pressure 125/67, pulse 71, temperature 97.9 F (36.6 C), temperature source Oral, resp. rate 18, height 5\' 11"  (1.803 m), weight 227 lb 14.4 oz (103.375 kg), SpO2 98 %.    HEENT: Neck without mass Lymphatics: No cervical, supraclavicular, axillary, or inguinal nodes Resp: Lungs clear bilaterally Cardio: Regular rate and rhythm GI: No hepatomegaly, nontender, no mass Vascular: No leg edema Rectal: Examination of the perineum and rectum reveals no nodule or mass, no blood on the examination glove     Medications: I have reviewed the patient's current medications.  Assessment/Plan: 1.Squamous cell carcinoma of the anal canal, HPV positive.  2. Staging CT/PET scan with tiny lung nodules, hypermetabolic retroperitoneal, right inguinal, and left supraclavicular nodes . FNA biopsy of a left supraclavicular lymph node on 05/03/2012 confirmed metastatic squamous cell carcinoma.  -Cycle 1 of 5-fluorouracil/mitomycin-C and concurrent radiation on 05/20/2012. Cycle 2 of 5-fluorouracil/mitomycin-C on 06/24/2012 with completion of radiation on 06/28/2012.  -Restaging PET scan 08/02/2012 with resolution of the hypermetabolic anal primary and right inguinal lymph node. Persistent hypermetabolic supraclavicular and retroperitoneal nodes.  -cycle 1 of salvage Taxol/carboplatin chemotherapy on 08/30/2012 . Restaging CTs after 4 cycles of Taxol/carboplatin confirmed resolution of retroperitoneal lymphadenopathy and a decrease in the size of a previously noted left supraclavicular node. He completed cycle 6 of Taxol/carboplatin on 01/02/2013.  -A negative flexible sigmoidoscopy 04/01/2013  -Staging CT scans 10/09/2013 with stable small  left supraclavicular node, no evidence of progressive metastatic disease  3. Hypermetabolic activity at the right supraglottic larynx on the PET scan 04/17/2012 with associated soft tissue fullness status post a negative ENT evaluation by Dr. Constance Holster.  4. History of aortic stenosis-status post aortic valve replacement and coronary artery bypass surgery in November 2012.  5. Mucositis following cycle 1 chemotherapy. Resolved.  6. Skin breakdown at the perineum and penis secondary to chemotherapy and radiation. Resolved.  7. 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 Bone pain after Neulasta  9. Generalized seizure 09/24/2012-negative brain CT,? Etiology. He has been evaluated by neurology and was prescribed Vimpat. Now taking Lamictal  10.Thrombocytopenia secondary chemotherapy -improved  11. Peripheral neuropathy-predating chemotherapy  12. Erectile dysfunction-followed by Dr. Risa Grill   Disposition:  Mr. Cordrey remains in clinical remission from anal cancer. He will return for office visit in 6 months. He will see Dr. Lisbeth Renshaw in the interim. I will refer him to Dr. Deatra Ina for a surveillance sigmoidoscopy.  Betsy Coder, MD  12/17/2014  9:18 AM

## 2014-12-17 NOTE — Telephone Encounter (Signed)
gave and printed appt sched adn avs for pt for OCT.

## 2015-01-11 ENCOUNTER — Telehealth: Payer: Self-pay | Admitting: Gastroenterology

## 2015-01-11 NOTE — Telephone Encounter (Signed)
I could only find a recall for colonoscopy. Please review this and let me know. Thanks.

## 2015-01-12 ENCOUNTER — Telehealth: Payer: Self-pay | Admitting: *Deleted

## 2015-01-12 NOTE — Telephone Encounter (Signed)
He needs a full colon. Thanks.

## 2015-01-12 NOTE — Telephone Encounter (Signed)
Spouse [name] called inquiring about colonoscopy appointment discussed at last visit.  "We haven't heard anything yet.  Will notify Dr. Benay Spice to ensure communication with Dr. Deatra Ina has occurred.  Return number [phone].   Observed communication with Dr. Deatra Ina and [facility] about pending schedule.  Unable to leave message on number provided to notify [facility].  [facility] will contact patient when test scheduled or ready for scheduling.

## 2015-01-12 NOTE — Telephone Encounter (Signed)
He should be scheduled for colonoscopy

## 2015-01-21 ENCOUNTER — Encounter: Payer: Self-pay | Admitting: Gastroenterology

## 2015-01-28 DIAGNOSIS — R5383 Other fatigue: Secondary | ICD-10-CM | POA: Diagnosis not present

## 2015-01-29 DIAGNOSIS — R5383 Other fatigue: Secondary | ICD-10-CM | POA: Diagnosis not present

## 2015-02-02 NOTE — Telephone Encounter (Signed)
appt scheduled

## 2015-02-04 ENCOUNTER — Ambulatory Visit (INDEPENDENT_AMBULATORY_CARE_PROVIDER_SITE_OTHER): Payer: Medicare Other | Admitting: Cardiology

## 2015-02-04 ENCOUNTER — Encounter: Payer: Self-pay | Admitting: Cardiology

## 2015-02-04 VITALS — BP 128/74 | HR 56 | Ht 71.0 in | Wt 233.8 lb

## 2015-02-04 DIAGNOSIS — I350 Nonrheumatic aortic (valve) stenosis: Secondary | ICD-10-CM

## 2015-02-04 DIAGNOSIS — I2510 Atherosclerotic heart disease of native coronary artery without angina pectoris: Secondary | ICD-10-CM | POA: Diagnosis not present

## 2015-02-04 DIAGNOSIS — I2583 Coronary atherosclerosis due to lipid rich plaque: Secondary | ICD-10-CM

## 2015-02-04 DIAGNOSIS — E669 Obesity, unspecified: Secondary | ICD-10-CM

## 2015-02-04 DIAGNOSIS — Z954 Presence of other heart-valve replacement: Secondary | ICD-10-CM | POA: Diagnosis not present

## 2015-02-04 DIAGNOSIS — Z952 Presence of prosthetic heart valve: Secondary | ICD-10-CM

## 2015-02-04 DIAGNOSIS — Z951 Presence of aortocoronary bypass graft: Secondary | ICD-10-CM

## 2015-02-04 DIAGNOSIS — R6882 Decreased libido: Secondary | ICD-10-CM | POA: Diagnosis not present

## 2015-02-04 NOTE — Patient Instructions (Signed)
Medication Instructions:  Your physician recommends that you continue on your current medications as directed. Please refer to the Current Medication list given to you today.  Follow-Up: Follow up in 1 year with Dr. Skains.  You will receive a letter in the mail 2 months before you are due.  Please call us when you receive this letter to schedule your follow up appointment.  Thank you for choosing [facility]!!       

## 2015-02-04 NOTE — Progress Notes (Signed)
[facility] [address], [address] [place], [place]  [address] Phone: [phone] Fax:  [phone]  Date:  02/04/2015   ID:  DERREK PUFF, DOB 09-Nov-1945, MRN [id number]  PCP:  Vena Austria, MD   History of Present Illness: Kirk [name] "Laverna Peace" is a 69 y.o. 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 Also has a supraclavicular and peri-abdominal aortic lymph node. Seen oncology at [facility] and had chemo/rad.   He is doing well from a cardiac standpoint. No shortness of breath. Cancer is in remission he states.   No changes in medications. LDL cholesterol 78 on 12/14, triglycerides 220 range. Trying to lose a little bit of weight.  Shingles. Amotivational for a while. Grapey. Injection for ED. Enjoys washing truck. [name] is his wife.    Wt Readings from Last 3 Encounters:  02/04/15 233 lb 12.8 oz (106.051 kg)  12/17/14 227 lb 14.4 oz (103.375 kg)  10/28/14 234 lb (106.142 kg)     Past Medical History  Diagnosis Date  . Elevated cholesterol   . ED (erectile dysfunction)   . Aortic stenosis   . Coronary artery disease     aortic stenosis, CAD  . Angina     6 wks. ago  . Heart murmur   . Shortness of breath   . Arthritis     1995- cerv. fusion- [facility]  . CHF (congestive heart failure)   . Rectal bleeding   . Blood in stool   . Thrombosed hemorrhoids   . ED (erectile dysfunction)   . Kidney stones     hx  . 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 Diabetes mellitus     Type 2  . History of radiation therapy 05/20/12-06/28/12    anal cancer=54gy total dose  . Seizure disorder, grand mal   . Seizures     Past Surgical History  Procedure Laterality Date  . Lithotripsy  2001 AND 2002    DR PETERSON  . C4-6 fusion  [place]  . Aortic valve replacement  08/11/2011    Procedure: AORTIC VALVE REPLACEMENT (AVR);  Surgeon:  Rexene Alberts, MD;  Location: [facility];  Service: Open Heart Surgery;  Laterality: N/A;  . Coronary artery bypass graft  08/16/2011    Procedure: CORONARY ARTERY BYPASS GRAFTING (CABG);  Surgeon: Rexene Alberts, MD;  Location: [facility];  Service: Open Heart Surgery;  Laterality: N/A;  CABG times one using left internal mammary artery  . Anal fissure repair      patient unsure of date  . Colonoscopy      Current Outpatient Prescriptions  Medication Sig Dispense Refill  . amitriptyline (ELAVIL) 25 MG tablet Take 25 mg by mouth at bedtime.     Marland Kitchen aspirin EC 81 MG tablet Take 81 mg by mouth daily.    Marland Kitchen atorvastatin (LIPITOR) 80 MG tablet Take 1 tablet (80 mg total) by mouth daily. 90 tablet 3  . B Complex-C-E-Zn (BEC/ZINC PO) Take 1 tablet by mouth daily.    . Cholecalciferol 1000 UNITS tablet 2,000 Units. Take 185 mg by mouth daily.    . DULoxetine (CYMBALTA) 60 MG capsule Take 60 mg by mouth daily.    Marland Kitchen losartan (COZAAR) 50 MG tablet Take 1 tablet (50 mg total) by mouth daily. 90 tablet 3  .  metoprolol tartrate (LOPRESSOR) 25 MG tablet TAKE 1 TABLET BY MOUTH TWICE A DAY 180 tablet 3  . saxagliptin HCl (ONGLYZA) 5 MG TABS tablet Take 5 mg by mouth daily.    . Vitamins-Lipotropics (LIPO-FLAVONOID PLUS) TABS Take 1 tablet by mouth 3 (three) times daily after meals.       No current facility-administered medications for this visit.    Allergies:    Allergies  Allergen Reactions  . Cat Hair Extract Shortness Of Breath and Swelling  . Atorvastatin Other (See Comments)    Pt has muscle ache on Atorvastatin but IS ABLE TO TAKE LIPITOR without any issues    Social History:  The patient  reports that he has never smoked. He has never used smokeless tobacco. He reports that he drinks alcohol. He reports that he does not use illicit drugs.  [name] is his wife. [name].  ROS:  Please see the history of present illness.   Denies any anginal symptoms, no syncope, no fevers, no chills, no orthopnea. Hair  is growing back.    PHYSICAL EXAM: VS:  BP 128/74 mmHg  Pulse 56  Ht 5\' 11"  (1.803 m)  Wt 233 lb 12.8 oz (106.051 kg)  BMI 32.62 kg/m2 Well nourished, well developed, in no acute distress HEENT: normal Neck: no JVD Cardiac:  normal S1, S2; RRR; soft systolic murmur Lungs:  clear to auscultation bilaterally, no wheezing, rhonchi or rales Abd: soft, nontender, no hepatomegalyoverweight Ext: no edema Skin: warm and dry Neuro: no focal abnormalities noted  EKG:    Today: 02/04/15-sinus or bradycardia rate 56 with sinus arrhythmia, otherwise normal. 03/03/14-sinus bradycardia rate 55, incomplete left bundle branch block     ASSESSMENT AND PLAN:  1. Coronary artery disease-status post bypass. Doing very well, no anginal symptoms. Bypass was done in the setting of aortic valve replacement. 2. Aortic valve replacement-bioprosthetic-doing very well. Soft murmur heard. 3. ED - Grapey, injection. Working well 4. Dysthymia - feeling better now.  5. Hyperlipidemia-excellent lipid results however triglycerides are mildly elevated. Weight loss, diet. Continue with statin therapy. 6. Obesity-continue with weight loss.  7. 1 year follow up.   Signed, [name], MD [facility]  02/04/2015 8:39 AM

## 2015-02-05 ENCOUNTER — Encounter: Payer: Self-pay | Admitting: Cardiology

## 2015-02-23 DIAGNOSIS — E1140 Type 2 diabetes mellitus with diabetic neuropathy, unspecified: Secondary | ICD-10-CM | POA: Diagnosis not present

## 2015-02-23 DIAGNOSIS — I10 Essential (primary) hypertension: Secondary | ICD-10-CM | POA: Diagnosis not present

## 2015-02-23 DIAGNOSIS — G4700 Insomnia, unspecified: Secondary | ICD-10-CM | POA: Diagnosis not present

## 2015-02-23 DIAGNOSIS — E780 Pure hypercholesterolemia: Secondary | ICD-10-CM | POA: Diagnosis not present

## 2015-02-23 DIAGNOSIS — Z1389 Encounter for screening for other disorder: Secondary | ICD-10-CM | POA: Diagnosis not present

## 2015-02-23 DIAGNOSIS — E291 Testicular hypofunction: Secondary | ICD-10-CM | POA: Diagnosis not present

## 2015-02-23 DIAGNOSIS — Z0000 Encounter for general adult medical examination without abnormal findings: Secondary | ICD-10-CM | POA: Diagnosis not present

## 2015-02-23 DIAGNOSIS — G629 Polyneuropathy, unspecified: Secondary | ICD-10-CM | POA: Diagnosis not present

## 2015-02-23 NOTE — Addendum Note (Signed)
Addended by: Gust Brooms A on: 02/23/2015 07:50 AM   Modules accepted: Orders

## 2015-03-05 ENCOUNTER — Ambulatory Visit (AMBULATORY_SURGERY_CENTER): Payer: Self-pay | Admitting: *Deleted

## 2015-03-05 VITALS — Ht 70.5 in | Wt 230.2 lb

## 2015-03-05 DIAGNOSIS — Z85048 Personal history of other malignant neoplasm of rectum, rectosigmoid junction, and anus: Secondary | ICD-10-CM

## 2015-03-05 MED ORDER — NA SULFATE-K SULFATE-MG SULF 17.5-3.13-1.6 GM/177ML PO SOLN: ORAL | Status: DC

## 2015-03-05 NOTE — Progress Notes (Signed)
No allergies to eggs or soy. No problems with anesthesia.  Pt given Emmi instructions for colonoscopy  No oxygen use  No diet drug use  

## 2015-03-18 ENCOUNTER — Telehealth: Payer: Self-pay | Admitting: Cardiology

## 2015-03-18 MED ORDER — ATORVASTATIN CALCIUM 80 MG PO TABS: 80.0000 mg | ORAL_TABLET | Freq: Every day | ORAL | Status: DC

## 2015-03-18 NOTE — Telephone Encounter (Signed)
New message      Wife want to make sure lipitor was called in to [facility] and not [facility].  Also, please call in a 90 day supply.

## 2015-03-19 ENCOUNTER — Encounter: Payer: Self-pay | Admitting: Gastroenterology

## 2015-03-19 ENCOUNTER — Ambulatory Visit (AMBULATORY_SURGERY_CENTER): Payer: Medicare Other | Admitting: Gastroenterology

## 2015-03-19 VITALS — BP 112/57 | HR 60 | Temp 96.8°F | Resp 20 | Ht 70.5 in | Wt 230.0 lb

## 2015-03-19 DIAGNOSIS — E119 Type 2 diabetes mellitus without complications: Secondary | ICD-10-CM | POA: Diagnosis not present

## 2015-03-19 DIAGNOSIS — Z85048 Personal history of other malignant neoplasm of rectum, rectosigmoid junction, and anus: Secondary | ICD-10-CM | POA: Diagnosis present

## 2015-03-19 DIAGNOSIS — Z1211 Encounter for screening for malignant neoplasm of colon: Secondary | ICD-10-CM

## 2015-03-19 DIAGNOSIS — I509 Heart failure, unspecified: Secondary | ICD-10-CM | POA: Diagnosis not present

## 2015-03-19 LAB — GLUCOSE, CAPILLARY
GLUCOSE-CAPILLARY: 98 mg/dL (ref 65–99)
Glucose-Capillary: 97 mg/dL (ref 65–99)

## 2015-03-19 MED ORDER — SODIUM CHLORIDE 0.9 % IV SOLN: 500.0000 mL | INTRAVENOUS | Status: DC

## 2015-03-19 NOTE — Patient Instructions (Signed)

## 2015-03-19 NOTE — Progress Notes (Signed)
Transferred to recovery room. A/O x3, pleased with MAC.  VSS.  Report to Wendy, RN. 

## 2015-03-19 NOTE — Op Note (Signed)
[facility]  [address]. [place], [id number]   COLONOSCOPY PROCEDURE REPORT  PATIENT: Kirk [name], Kirk [name]  MR#: [id number] BIRTHDATE: 13-Dec-1945 , 89  yrs. old GENDER: male ENDOSCOPIST: Inda Castle, MD REFERRED BR:AXEN Edrick Kins, M.D. PROCEDURE DATE:  03/19/2015 PROCEDURE:   Colonoscopy, surveillance First Screening Colonoscopy - Avg.  risk and is 50 yrs.  old or older - No.  Prior Negative Screening - Now for repeat screening. N/A  History of Adenoma - Now for follow-up colonoscopy & has been > or = to 3 yrs.  N/A  Polyps removed today? No ASA CLASS:   Class II INDICATIONS:PH Colon or Rectal Adenocarcinoma.   history of anal CA MEDICATIONS: Monitored anesthesia care and Propofol 200 mg IV  DESCRIPTION OF PROCEDURE:   After the risks benefits and alternatives of the procedure were thoroughly explained, informed consent was obtained.  The digital rectal exam revealed no abnormalities of the rectum.   The [id number]  endoscope was introduced through the anus and advanced to the cecum, which was identified by both the appendix and ileocecal valve. No adverse events experienced.   The quality of the prep was (Suprep was used) good.  The instrument was then slowly withdrawn as the colon was fully examined. Estimated blood loss is zero unless otherwise noted in this procedure report.      COLON FINDINGS: A normal appearing cecum, ileocecal valve, and appendiceal orifice were identified.  The ascending, transverse, descending, sigmoid colon, and rectum appeared unremarkable. Retroflexion was not performed due to a narrow rectal vault. The time to cecum = 5.9 Withdrawal time = 7.1   The scope was withdrawn and the procedure completed. COMPLICATIONS: There were no immediate complications.  ENDOSCOPIC IMPRESSION: Normal colonoscopy  RECOMMENDATIONS: Repeat colonoscopy 3-5 years  eSigned:  Inda Castle, MD 03/19/2015 3:22 PM   cc:

## 2015-03-22 ENCOUNTER — Telehealth: Payer: Self-pay | Admitting: *Deleted

## 2015-03-22 NOTE — Telephone Encounter (Signed)
Message left

## 2015-04-05 ENCOUNTER — Encounter: Payer: Self-pay | Admitting: Radiation Oncology

## 2015-04-05 ENCOUNTER — Ambulatory Visit
Admission: RE | Admit: 2015-04-05 | Discharge: 2015-04-05 | Disposition: A | Discharge status: Home / Self Care | Payer: Medicare Other | Source: Ambulatory Visit | Attending: Radiation Oncology | Admitting: Radiation Oncology

## 2015-04-05 VITALS — BP 122/65 | HR 60 | Temp 98.0°F | Ht 70.5 in | Wt 233.6 lb

## 2015-04-05 DIAGNOSIS — C211 Malignant neoplasm of anal canal: Secondary | ICD-10-CM

## 2015-04-05 NOTE — Progress Notes (Addendum)
Kirk [name] has returned today for reassessment s/p xrt for anal cancer.  He denies any pain and any difficulty with defecation.  Colonoscopy last week  and states "everything clear" as reported by Dr. Deatra Ina.

## 2015-04-05 NOTE — Progress Notes (Signed)
  Radiation Oncology         (336) [phone] ________________________________  Name: Kirk [name] MRN: [id number]  Date: 04/05/2015  DOB: 04-Sep-1946  Follow-Up Visit Note  CC: Vena Austria, MD  Maury Dus, MD  Diagnosis: Squamous cell carcinoma of the anal canal  Interval Since Last Radiation: The patient completed radiation treatment on 06/28/2012  Narrative:  Mr. Filler has returned today for reassessment s/p xrt for anal cancer.  He denies any pain and any difficulty with defecation. Colonoscopy last week  and states "everything clear" as reported by Dr. Deatra Ina. Denies any pelvic stiffness.   ALLERGIES:  is allergic to cat hair extract; atorvastatin; and oxycodone.  Meds: Current Outpatient Prescriptions  Medication Sig Dispense Refill  . amitriptyline (ELAVIL) 25 MG tablet Take 25 mg by mouth at bedtime.     Marland Kitchen aspirin EC 81 MG tablet Take 81 mg by mouth daily.    Marland Kitchen atorvastatin (LIPITOR) 80 MG tablet Take 1 tablet (80 mg total) by mouth daily. 90 tablet 3  . B Complex-C-E-Zn (BEC/ZINC PO) Take by mouth daily.    . Cholecalciferol 1000 UNITS tablet 2,000 Units. Take 185 mg by mouth daily.    Marland Kitchen losartan (COZAAR) 50 MG tablet Take 1 tablet (50 mg total) by mouth daily. 90 tablet 3  . metoprolol tartrate (LOPRESSOR) 25 MG tablet TAKE 1 TABLET BY MOUTH TWICE A DAY 180 tablet 3  . saxagliptin HCl (ONGLYZA) 5 MG TABS tablet Take 5 mg by mouth daily.    . Vitamins-Lipotropics (LIPO-FLAVONOID PLUS) TABS Take 1 tablet by mouth 3 (three) times daily after meals.       No current facility-administered medications for this encounter.    Physical Findings: The patient is in no acute distress. Patient is alert and oriented.  height is 5' 10.5" (1.791 m) and weight is 233 lb 9.6 oz (105.96 kg). His temperature is 98 F (36.7 C). His blood pressure is 122/65 and his pulse is 60. Marland Kitchen   Rectal Exam: No external lesions, no suspicious findings on digital rectal exam, no bleeding  Lab  Findings: Lab Results  Component Value Date   WBC 5.0 02/25/2013   HGB 11.7* 02/25/2013   HCT 34.5* 02/25/2013   MCV 100.8* 02/25/2013   PLT 126* 02/25/2013    Radiographic Findings: No results found.  Impression: The patient is doing well. Clinically NED. Recent colonoscopy shows no evidence of disease.   Plan: Dr.Kaplan suggests a repeat colonoscopy 3.5 years. Patient will follow up with me in 6 months.   I spent 15 minutes with the patient today, the majority of which was spent counseling the patient on the diagnosis of cancer and coordinating care.   ------------------------------------------------  Jodelle Gross, MD, PhD  This document serves as a record of services personally performed by Kyung Rudd, MD. It was created on his behalf by Derek Mound, a trained medical scribe. The creation of this record is based on the scribe's personal observations and the provider's statements to them. This document has been checked and approved by the attending provider.

## 2015-04-29 ENCOUNTER — Other Ambulatory Visit: Payer: Self-pay | Admitting: *Deleted

## 2015-04-29 MED ORDER — METOPROLOL TARTRATE 25 MG PO TABS: ORAL_TABLET | ORAL | Status: DC

## 2015-04-29 MED ORDER — LOSARTAN POTASSIUM 50 MG PO TABS: 50.0000 mg | ORAL_TABLET | Freq: Every day | ORAL | Status: DC

## 2015-06-02 ENCOUNTER — Telehealth: Payer: Self-pay | Admitting: Oncology

## 2015-06-02 NOTE — Telephone Encounter (Signed)
pt wife called to r/s appt due to being out of town...done...aware of new d.t

## 2015-06-03 DIAGNOSIS — Z23 Encounter for immunization: Secondary | ICD-10-CM | POA: Diagnosis not present

## 2015-06-10 ENCOUNTER — Ambulatory Visit: Payer: BLUE CROSS/BLUE SHIELD | Admitting: Oncology

## 2015-06-15 ENCOUNTER — Telehealth: Payer: Self-pay | Admitting: Oncology

## 2015-06-15 ENCOUNTER — Ambulatory Visit (HOSPITAL_COMMUNITY)
Admission: RE | Admit: 2015-06-15 | Discharge: 2015-06-15 | Disposition: A | Discharge status: Home / Self Care | Payer: Medicare Other | Source: Ambulatory Visit | Attending: Oncology | Admitting: Oncology

## 2015-06-15 ENCOUNTER — Telehealth: Payer: Self-pay | Admitting: *Deleted

## 2015-06-15 ENCOUNTER — Ambulatory Visit (HOSPITAL_BASED_OUTPATIENT_CLINIC_OR_DEPARTMENT_OTHER): Payer: Medicare Other | Admitting: Oncology

## 2015-06-15 VITALS — BP 156/70 | HR 53 | Temp 98.3°F | Resp 18 | Ht 70.5 in | Wt 235.1 lb

## 2015-06-15 DIAGNOSIS — I517 Cardiomegaly: Secondary | ICD-10-CM | POA: Diagnosis not present

## 2015-06-15 DIAGNOSIS — C211 Malignant neoplasm of anal canal: Secondary | ICD-10-CM

## 2015-06-15 DIAGNOSIS — I2510 Atherosclerotic heart disease of native coronary artery without angina pectoris: Secondary | ICD-10-CM | POA: Diagnosis not present

## 2015-06-15 DIAGNOSIS — Z952 Presence of prosthetic heart valve: Secondary | ICD-10-CM | POA: Diagnosis not present

## 2015-06-15 DIAGNOSIS — Z951 Presence of aortocoronary bypass graft: Secondary | ICD-10-CM | POA: Insufficient documentation

## 2015-06-15 DIAGNOSIS — R0989 Other specified symptoms and signs involving the circulatory and respiratory systems: Secondary | ICD-10-CM | POA: Diagnosis not present

## 2015-06-15 DIAGNOSIS — C210 Malignant neoplasm of anus, unspecified: Secondary | ICD-10-CM | POA: Diagnosis not present

## 2015-06-15 NOTE — Telephone Encounter (Signed)
Pt and wife made aware of CXR results and were appreciative of call.

## 2015-06-15 NOTE — Telephone Encounter (Signed)
-----   Message from Kirk Pier, MD sent at 06/15/2015  5:42 PM EDT ----- Please call patient, cxr is negative

## 2015-06-15 NOTE — Telephone Encounter (Signed)
Gave and printed appt sched and avs for pt for June 2017 °

## 2015-06-15 NOTE — Progress Notes (Signed)
  [facility] OFFICE PROGRESS NOTE   Diagnosis: Anal cancer  INTERVAL HISTORY:   Mr. Manthey returns as scheduled. He feels well. No complaint. He underwent a colonoscopy on 03/19/2015 by Dr. Deatra Ina. This was a normal study.  Objective:  Vital signs in last 24 hours:  Blood pressure 156/70, pulse 53, temperature 98.3 F (36.8 C), temperature source Oral, resp. rate 18, height 5' 10.5" (1.791 m), weight 235 lb 1.6 oz (106.641 kg), SpO2 95 %.    HEENT: Neck without mass Lymphatics: No cervical, supra-clavicular, axillary, or inguinal nodes Resp: Lungs clear bilaterally Cardio: Regular rate and rhythm GI: No hepatosplenomegaly, no mass, external examination of the perineum and anal verge reveals no evidence of recurrent tumor Vascular: No leg edema     Medications: I have reviewed the patient's current medications.  Assessment/Plan: 1.Squamous cell carcinoma of the anal canal, HPV positive.  2. Staging CT/PET scan with tiny lung nodules, hypermetabolic retroperitoneal, right inguinal, and left supraclavicular nodes . FNA biopsy of a left supraclavicular lymph node on 05/03/2012 confirmed metastatic squamous cell carcinoma.  -Cycle 1 of 5-fluorouracil/mitomycin-C and concurrent radiation on 05/20/2012. Cycle 2 of 5-fluorouracil/mitomycin-C on 06/24/2012 with completion of radiation on 06/28/2012.  -Restaging PET scan 08/02/2012 with resolution of the hypermetabolic anal primary and right inguinal lymph node. Persistent hypermetabolic supraclavicular and retroperitoneal nodes.  -cycle 1 of salvage Taxol/carboplatin chemotherapy on 08/30/2012 . Restaging CTs after 4 cycles of Taxol/carboplatin confirmed resolution of retroperitoneal lymphadenopathy and a decrease in the size of a previously noted left supraclavicular node. He completed cycle 6 of Taxol/carboplatin on 01/02/2013.  -A negative flexible sigmoidoscopy 04/01/2013  -Staging CT scans 10/09/2013 with stable  small left supraclavicular node, no evidence of progressive metastatic disease  -Negative colonoscopy 03/19/2015 3. Hypermetabolic activity at the right supraglottic larynx on the PET scan 04/17/2012 with associated soft tissue fullness status post a negative ENT evaluation by Dr. Constance Holster.  4. History of aortic stenosis-status post aortic valve replacement and coronary artery bypass surgery in November 2012.  5. Mucositis following cycle 1 chemotherapy. Resolved.  6. Skin breakdown at the perineum and penis secondary to chemotherapy and radiation. Resolved.  7. 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 Bone pain after Neulasta  9. Generalized seizure 09/24/2012-negative brain CT,? Etiology. He has been evaluated by neurology and was prescribed Vimpat.  10.history of Thrombocytopenia secondary chemotherapy -improved  11. Peripheral neuropathy-predating chemotherapy  12. Erectile dysfunction-followed by Dr. Risa Grill    Disposition:  Mr. Kidane remains in clinical remission from anal cancer. He will be referred for a surveillance chest x-ray today. He will return for an office visit in 8 months. He will see Dr. Lisbeth Renshaw in the interim.  Betsy Coder, MD  06/15/2015  10:19 AM

## 2015-07-20 DIAGNOSIS — D1801 Hemangioma of skin and subcutaneous tissue: Secondary | ICD-10-CM | POA: Diagnosis not present

## 2015-07-20 DIAGNOSIS — L812 Freckles: Secondary | ICD-10-CM | POA: Diagnosis not present

## 2015-07-20 DIAGNOSIS — D225 Melanocytic nevi of trunk: Secondary | ICD-10-CM | POA: Diagnosis not present

## 2015-07-20 DIAGNOSIS — L821 Other seborrheic keratosis: Secondary | ICD-10-CM | POA: Diagnosis not present

## 2015-08-24 DIAGNOSIS — Z125 Encounter for screening for malignant neoplasm of prostate: Secondary | ICD-10-CM | POA: Diagnosis not present

## 2015-08-24 DIAGNOSIS — E291 Testicular hypofunction: Secondary | ICD-10-CM | POA: Diagnosis not present

## 2015-08-24 DIAGNOSIS — G629 Polyneuropathy, unspecified: Secondary | ICD-10-CM | POA: Diagnosis not present

## 2015-08-24 DIAGNOSIS — I10 Essential (primary) hypertension: Secondary | ICD-10-CM | POA: Diagnosis not present

## 2015-08-24 DIAGNOSIS — Z23 Encounter for immunization: Secondary | ICD-10-CM | POA: Diagnosis not present

## 2015-08-24 DIAGNOSIS — E7800 Pure hypercholesterolemia, unspecified: Secondary | ICD-10-CM | POA: Diagnosis not present

## 2015-08-24 DIAGNOSIS — E1140 Type 2 diabetes mellitus with diabetic neuropathy, unspecified: Secondary | ICD-10-CM | POA: Diagnosis not present

## 2015-08-24 DIAGNOSIS — G4700 Insomnia, unspecified: Secondary | ICD-10-CM | POA: Diagnosis not present

## 2015-09-28 DIAGNOSIS — H43313 Vitreous membranes and strands, bilateral: Secondary | ICD-10-CM | POA: Diagnosis not present

## 2015-09-28 DIAGNOSIS — H5203 Hypermetropia, bilateral: Secondary | ICD-10-CM | POA: Diagnosis not present

## 2015-10-11 ENCOUNTER — Encounter: Payer: Self-pay | Admitting: Radiation Oncology

## 2015-10-11 ENCOUNTER — Ambulatory Visit
Admission: RE | Admit: 2015-10-11 | Discharge: 2015-10-11 | Disposition: A | Discharge status: Home / Self Care | Payer: Medicare Other | Source: Ambulatory Visit | Attending: Radiation Oncology | Admitting: Radiation Oncology

## 2015-10-11 VITALS — BP 146/59 | HR 72 | Temp 98.5°F | Resp 20 | Wt 243.6 lb

## 2015-10-11 DIAGNOSIS — C211 Malignant neoplasm of anal canal: Secondary | ICD-10-CM | POA: Diagnosis not present

## 2015-10-11 NOTE — Progress Notes (Signed)
  Radiation Oncology         [phone]) [phone] ________________________________  Name: Kirk [name] MRN: [id number]  Date: 10/11/2015  DOB: December 28, 1945  Follow-Up Visit Note  CC: Vena Austria, MD  Maury Dus, MD  Diagnosis: Squamous cell carcinoma of the anal canal  Interval Since Last Radiation: 3 years and 3 months. 05/20/2012 through 06/28/2012  Site/dose:   The patient was treated to the gross disease within the anal region in addition to regional lymphatics. The patient was treated to a dose of 54 gray at 1.8 gray per fraction in 30 fractions. His treatment consisted of a IMRT technique on her chemotherapy unit with daily image guidance.  Narrative:  Kirk [name] has returned today for reassessment s/p xrt for anal cancer. He last saw Dr. Benay Spice on 06/15/15. Doing well. Bowels regular. Appetite good, energy level a lot better for the past 2 weeks. No complaints at this time.  ALLERGIES:  is allergic to cat hair extract; atorvastatin; and oxycodone.  Meds: Current Outpatient Prescriptions  Medication Sig Dispense Refill  . amitriptyline (ELAVIL) 25 MG tablet Take 25 mg by mouth at bedtime.     Marland Kitchen aspirin EC 81 MG tablet Take 81 mg by mouth daily.    Marland Kitchen atorvastatin (LIPITOR) 80 MG tablet Take 1 tablet (80 mg total) by mouth daily. 90 tablet 3  . B Complex-C-E-Zn (BEC/ZINC PO) Take by mouth daily.    . Cholecalciferol 1000 UNITS tablet 2,000 Units. Take 185 mg by mouth daily.    Marland Kitchen losartan (COZAAR) 50 MG tablet Take 1 tablet (50 mg total) by mouth daily. 90 tablet 3  . metoprolol tartrate (LOPRESSOR) 25 MG tablet TAKE 1 TABLET BY MOUTH TWICE A DAY 180 tablet 3  . saxagliptin HCl (ONGLYZA) 5 MG TABS tablet Take 5 mg by mouth daily.    . Vitamins-Lipotropics (LIPO-FLAVONOID PLUS) TABS Take 1 tablet by mouth 3 (three) times daily after meals.       No current facility-administered medications for this encounter.    Physical Findings: The patient is in no acute distress.  Patient is alert and oriented.  weight is 243 lb 9.6 oz (110.496 kg). His oral temperature is 98.5 F (36.9 C). His blood pressure is 146/59 and his pulse is 72. His respiration is 20.  Rectal Exam: No external lesions, no suspicious findings on digital rectal exam, no bleeding.  Lab Findings: Lab Results  Component Value Date   WBC 5.0 02/25/2013   HGB 11.7* 02/25/2013   HCT 34.5* 02/25/2013   MCV 100.8* 02/25/2013   PLT 126* 02/25/2013    Radiographic Findings: No results found.  Impression: The patient is doing well. Clinically NED.  Plan: He will see Dr. Benay Spice in 6 months and radiation oncology in 1 year.  I spent 15 minutes with the patient today, the majority of which was spent counseling the patient on the diagnosis of cancer and coordinating care.  ------------------------------------------------  Jodelle Gross, MD, PhD  This document serves as a record of services personally performed by Kyung Rudd, MD. It was created on his behalf by Darcus Austin, a trained medical scribe. The creation of this record is based on the scribe's personal observations and the provider's statements to them. This document has been checked and approved by the attending provider.

## 2015-10-11 NOTE — Progress Notes (Signed)
Follow up  S/p rad txs anal canal 05/20/12-06/28/12  Doing well,  Bowels regular. Appetite good, energy level a lot better since past 2 weeks no c/o 8:05 AM BP 146/59 mmHg  Pulse 72  Temp(Src) 98.5 F (36.9 C) (Oral)  Resp 20  Wt 243 lb 9.6 oz (110.496 kg)  Wt Readings from Last 3 Encounters:  10/11/15 243 lb 9.6 oz (110.496 kg)  06/15/15 235 lb 1.6 oz (106.641 kg)  04/05/15 233 lb 9.6 oz (105.96 kg)

## 2015-12-06 DIAGNOSIS — Z0000 Encounter for general adult medical examination without abnormal findings: Secondary | ICD-10-CM | POA: Diagnosis not present

## 2015-12-06 DIAGNOSIS — N5201 Erectile dysfunction due to arterial insufficiency: Secondary | ICD-10-CM | POA: Diagnosis not present

## 2016-02-15 ENCOUNTER — Telehealth: Payer: Self-pay | Admitting: Oncology

## 2016-02-15 ENCOUNTER — Ambulatory Visit (HOSPITAL_BASED_OUTPATIENT_CLINIC_OR_DEPARTMENT_OTHER): Payer: Medicare Other | Admitting: Oncology

## 2016-02-15 VITALS — BP 152/74 | HR 68 | Temp 98.3°F | Resp 18 | Ht 70.5 in | Wt 242.2 lb

## 2016-02-15 DIAGNOSIS — Z85048 Personal history of other malignant neoplasm of rectum, rectosigmoid junction, and anus: Secondary | ICD-10-CM | POA: Diagnosis not present

## 2016-02-15 DIAGNOSIS — C211 Malignant neoplasm of anal canal: Secondary | ICD-10-CM

## 2016-02-15 NOTE — Progress Notes (Signed)
  [facility] OFFICE PROGRESS NOTE   Diagnosis: Anal cancer  INTERVAL HISTORY:   Mr. Beitz returns as scheduled. He feels well. He saw Dr. Lisbeth Renshaw in February. No difficulty with bowel function. No bleeding. Good appetite. He is working.  Objective:  Vital signs in last 24 hours:  Blood pressure 152/74, pulse 68, temperature 98.3 F (36.8 C), temperature source Oral, resp. rate 18, height 5' 10.5" (1.791 m), weight 242 lb 3.2 oz (109.861 kg), SpO2 98 %.    HEENT: Neck without mass Lymphatics: No cervical, supraclavicular, axillary, or inguinal nodes Resp: Lungs clear bilaterally Cardio: Regular rate and rhythm GI: No hepatosplenomegaly, no mass, nontender Vascular: No leg edema Rectal: No lesion at the anal verge, no mass in the rectal canal    Medications: I have reviewed the patient's current medications.  Assessment/Plan: 1.Squamous cell carcinoma of the anal canal, HPV positive.  2. Staging CT/PET scan with tiny lung nodules, hypermetabolic retroperitoneal, right inguinal, and left supraclavicular nodes . FNA biopsy of a left supraclavicular lymph node on 05/03/2012 confirmed metastatic squamous cell carcinoma.  -Cycle 1 of 5-fluorouracil/mitomycin-C and concurrent radiation on 05/20/2012. Cycle 2 of 5-fluorouracil/mitomycin-C on 06/24/2012 with completion of radiation on 06/28/2012.  -Restaging PET scan 08/02/2012 with resolution of the hypermetabolic anal primary and right inguinal lymph node. Persistent hypermetabolic supraclavicular and retroperitoneal nodes.  -cycle 1 of salvage Taxol/carboplatin chemotherapy on 08/30/2012 . Restaging CTs after 4 cycles of Taxol/carboplatin confirmed resolution of retroperitoneal lymphadenopathy and a decrease in the size of a previously noted left supraclavicular node. He completed cycle 6 of Taxol/carboplatin on 01/02/2013.  -A negative flexible sigmoidoscopy 04/01/2013  -Staging CT scans 10/09/2013 with stable small  left supraclavicular node, no evidence of progressive metastatic disease  -Negative colonoscopy 03/19/2015 3. Hypermetabolic activity at the right supraglottic larynx on the PET scan 04/17/2012 with associated soft tissue fullness status post a negative ENT evaluation by Dr. Constance Holster.  4. History of aortic stenosis-status post aortic valve replacement and coronary artery bypass surgery in November 2012.  5. Mucositis following cycle 1 chemotherapy. Resolved.  6. Skin breakdown at the perineum and penis secondary to chemotherapy and radiation. Resolved.  7. 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 Bone pain after Neulasta  9. Generalized seizure 09/24/2012-negative brain CT,? Etiology. He has been evaluated by neurology and was prescribed Vimpat.  10.history of Thrombocytopenia secondary chemotherapy -improved  11. Peripheral neuropathy-predating chemotherapy  12. Erectile dysfunction-followed by Dr. Risa Grill    Disposition:  Mr. Herandez remains in clinical remission from anal cancer. He will return for an office visit in 6 months.  Betsy Coder, MD  02/15/2016  8:47 AM

## 2016-02-15 NOTE — Telephone Encounter (Signed)
Gave and printed appt sched and avs for pt for DE[facility] °

## 2016-02-17 ENCOUNTER — Ambulatory Visit (INDEPENDENT_AMBULATORY_CARE_PROVIDER_SITE_OTHER): Payer: Medicare Other | Admitting: Cardiology

## 2016-02-17 ENCOUNTER — Encounter: Payer: Self-pay | Admitting: Cardiology

## 2016-02-17 VITALS — BP 124/86 | HR 51 | Ht 71.0 in | Wt 241.0 lb

## 2016-02-17 DIAGNOSIS — I2583 Coronary atherosclerosis due to lipid rich plaque: Secondary | ICD-10-CM

## 2016-02-17 DIAGNOSIS — Z951 Presence of aortocoronary bypass graft: Secondary | ICD-10-CM | POA: Diagnosis not present

## 2016-02-17 DIAGNOSIS — Z954 Presence of other heart-valve replacement: Secondary | ICD-10-CM | POA: Diagnosis not present

## 2016-02-17 DIAGNOSIS — I2510 Atherosclerotic heart disease of native coronary artery without angina pectoris: Secondary | ICD-10-CM

## 2016-02-17 DIAGNOSIS — Z952 Presence of prosthetic heart valve: Secondary | ICD-10-CM

## 2016-02-17 NOTE — Patient Instructions (Signed)

## 2016-02-17 NOTE — Progress Notes (Signed)
[place]. 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 Also has a supraclavicular and peri-abdominal aortic lymph node. Seen oncology at [facility] and had chemo/rad.   He is doing well from a cardiac standpoint. No shortness of breath. Cancer is in remission.   No changes in medications. LDL cholesterol 78 on 12/14, triglycerides 220 range. Trying to lose a little bit of weight. Has been up and down.  Shingles. Amotivational for a while. Grapey. Injection for ED.  Kirk [name] is his wife. 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   Wt Readings from Last 3 Encounters:  02/17/16 241 lb (109.317 kg)  02/15/16 242 lb 3.2 oz (109.861 kg)  10/11/15 243 lb 9.6 oz (110.496 kg)     Past Medical History  Diagnosis Date  . Elevated cholesterol   . ED (erectile dysfunction)   . Aortic stenosis   . Coronary artery disease     aortic stenosis, CAD  . Angina     6 wks. ago  . Heart murmur   . Shortness of breath   . Arthritis     1995- cerv. fusion- [facility]  . CHF (congestive heart failure) ([place])   . Rectal bleeding   . Blood in stool   . Thrombosed hemorrhoids   . ED (erectile dysfunction)   . Kidney stones     hx  . Cancer ([place]) 04/09/12    Rectum bx=invasive squamous cell carcinoma  . Diabetes mellitus     Type 2  . History of radiation therapy 05/20/12-06/28/12    anal cancer=54gy total dose  . Seizure disorder, grand mal ([place])   . Allergy   . Myocardial infarction ([place]) 2012  . Seizures ([place])     after taking Oxycodone; not prescribed seizure med    Past Surgical History    Procedure Laterality Date  . Lithotripsy  2001 AND 2002    DR PETERSON  . C4-6 fusion  [place]  . Aortic valve replacement  08/11/2011    Procedure: AORTIC VALVE REPLACEMENT (AVR);  Surgeon: Rexene Alberts, MD;  Location: [place];  Service: Open Heart Surgery;  Laterality: N/A;  . Coronary artery bypass graft  08/16/2011    Procedure: CORONARY ARTERY BYPASS GRAFTING (CABG);  Surgeon: Rexene Alberts, MD;  Location: [place];  Service: Open Heart Surgery;  Laterality: N/A;  CABG times one using left internal mammary artery  . Anal fissure repair      patient unsure of date  . Colonoscopy      Current Outpatient Prescriptions  Medication Sig Dispense Refill  . amitriptyline (ELAVIL) 25 MG tablet Take 25 mg by mouth at bedtime.     Marland Kitchen aspirin EC 81 MG tablet Take 81 mg by mouth daily.    Marland Kitchen atorvastatin (LIPITOR) 80 MG tablet Take 1 tablet (80 mg total) by mouth daily. 90 tablet 3  . Cholecalciferol 1000 UNITS tablet 2,000 Units. Take 185 mg by mouth daily.    . DULoxetine (CYMBALTA) 60 MG capsule Take 60 mg by mouth  daily.    . losartan (COZAAR) 50 MG tablet Take 1 tablet (50 mg total) by mouth daily. 90 tablet 3  . metoprolol tartrate (LOPRESSOR) 25 MG tablet TAKE 1 TABLET BY MOUTH TWICE A DAY 180 tablet 3  . saxagliptin HCl (ONGLYZA) 5 MG TABS tablet Take 5 mg by mouth daily.    . Vitamins-Lipotropics (LIPO-FLAVONOID PLUS) TABS Take 1 tablet by mouth 3 (three) times daily after meals.       No current facility-administered medications for this visit.    Allergies:    Allergies  Allergen Reactions  . Cat Hair Extract Shortness Of Breath and Swelling  . Atorvastatin Other (See Comments)    Pt has muscle ache on Atorvastatin but IS ABLE TO TAKE LIPITOR without any issues  . Oxycodone Other (See Comments)    seizure    Social History:  The patient  reports that he has never smoked. He has never used smokeless tobacco. He reports that he drinks alcohol. He reports that he does  not use illicit drugs.  Kirk [name] is his wife. Kirk [name].  ROS:  Please see the history of present illness.   Denies any anginal symptoms, no syncope, no fevers, no chills, no orthopnea. Hair is growing back.    PHYSICAL EXAM: VS:  BP 124/86 mmHg  Pulse 51  Ht 5\' 11"  (1.803 m)  Wt 241 lb (109.317 kg)  BMI 33.63 kg/m2 Well nourished, well developed, in no acute distress HEENT: normal Neck: no JVD Cardiac:  normal S1, S2; RRR; soft systolic murmur Lungs:  clear to auscultation bilaterally, no wheezing, rhonchi or rales Abd: soft, nontender, no hepatomegalyoverweight Ext: no edema Skin: warm and dry Neuro: no focal abnormalities noted  EKG:    Today: 02/17/16 - SB 51 no changes. 02/04/15-sinus or bradycardia rate 56 with sinus arrhythmia, otherwise normal. 03/03/14-sinus bradycardia rate 55, incomplete left bundle branch block     ASSESSMENT AND PLAN:  1. Coronary artery disease-status post bypass. Doing very well, no anginal symptoms. Bypass was done in the setting of aortic valve replacement. 2. Aortic valve replacement-bioprosthetic-doing very well. Soft murmur heard. I will check an echocardiogram since it has been several years. Dental antibiotics. 3. ED - Grapey, injection. Working well 4. Dysthymia - feeling better now. Kirk [name], working hard currently. Enjoys fishing. 5. Hyperlipidemia-excellent lipid results however triglycerides are mildly elevated. Weight loss, diet. Continue with statin therapy. 6. Obesity-continue with weight loss. Recently lost 8 pounds. Activity. This will also help diabetes. 7. 1 year follow up.   Signed, [name], MD [facility]  02/17/2016 8:49 AM

## 2016-03-08 ENCOUNTER — Other Ambulatory Visit (HOSPITAL_COMMUNITY): Payer: Medicare Other

## 2016-03-14 ENCOUNTER — Ambulatory Visit (HOSPITAL_COMMUNITY): Payer: Medicare Other | Attending: Cardiology

## 2016-03-14 ENCOUNTER — Other Ambulatory Visit: Payer: Self-pay

## 2016-03-14 DIAGNOSIS — Z954 Presence of other heart-valve replacement: Secondary | ICD-10-CM | POA: Diagnosis not present

## 2016-03-14 DIAGNOSIS — I350 Nonrheumatic aortic (valve) stenosis: Secondary | ICD-10-CM | POA: Diagnosis not present

## 2016-03-14 DIAGNOSIS — I517 Cardiomegaly: Secondary | ICD-10-CM | POA: Diagnosis not present

## 2016-03-14 DIAGNOSIS — I340 Nonrheumatic mitral (valve) insufficiency: Secondary | ICD-10-CM | POA: Insufficient documentation

## 2016-03-14 DIAGNOSIS — Z953 Presence of xenogenic heart valve: Secondary | ICD-10-CM | POA: Diagnosis not present

## 2016-03-14 DIAGNOSIS — I359 Nonrheumatic aortic valve disorder, unspecified: Secondary | ICD-10-CM | POA: Diagnosis present

## 2016-03-14 DIAGNOSIS — E119 Type 2 diabetes mellitus without complications: Secondary | ICD-10-CM | POA: Diagnosis not present

## 2016-03-14 DIAGNOSIS — Z952 Presence of prosthetic heart valve: Secondary | ICD-10-CM

## 2016-03-14 DIAGNOSIS — I77810 Thoracic aortic ectasia: Secondary | ICD-10-CM | POA: Insufficient documentation

## 2016-03-14 DIAGNOSIS — I2510 Atherosclerotic heart disease of native coronary artery without angina pectoris: Secondary | ICD-10-CM | POA: Diagnosis not present

## 2016-03-14 LAB — ECHOCARDIOGRAM COMPLETE
AO mean calculated velocity dopler: 178 cm/s
AOASC: 38 cm
AOVTI: 59.1 cm
AV Area VTI index: 0.55 cm2/m2
AV Area VTI: 1.01 cm2
AV Mean grad: 15 mmHg
AV Peak grad: 32 mmHg
AV VEL mean LVOT/AV: 0.35
AV area mean vel ind: 0.49 cm2/m2
AV peak Index: 0.44
AVAREAMEANV: 1.11 cm2
AVLVOTPG: 3 mmHg
AVPKVEL: 284 cm/s
Ao pk vel: 0.32 m/s
CHL CUP AV VEL: 1.26
E decel time: 299 msec
E/e' ratio: 13.75
FS: 29 % (ref 28–44)
IVS/LV PW RATIO, ED: 1.22
LA ID, A-P, ES: 48 mm
LA diam end sys: 48 mm
LA diam index: 2.11 cm/m2
LA vol index: 32 mL/m2
LAVOL: 73 mL
LAVOLA4C: 75 mL
LV E/e'average: 13.75
LV TDI E'LATERAL: 8.87
LV TDI E'MEDIAL: 5.95
LV dias vol index: 37 mL/m2
LV sys vol index: 12 mL/m2
LVDIAVOL: 85 mL (ref 62–150)
LVEEMED: 13.75
LVELAT: 8.87 cm/s
LVOT SV: 74 mL
LVOT VTI: 23.7 cm
LVOT area: 3.14 cm2
LVOT diameter: 20 mm
LVOTPV: 91 cm/s
LVOTVTI: 0.4 cm
LVSYSVOL: 27 mL (ref 21–61)
MV Dec: 299
MV pk E vel: 122 m/s
MVPG: 6 mmHg
MVPKAVEL: 101 m/s
PW: 12.5 mm — AB (ref 0.6–1.1)
RV LATERAL S' VELOCITY: 10 cm/s
Simpson's disk: 68
Stroke v: 58 ml
Valve area index: 0.55
Valve area: 1.26 cm2

## 2016-03-15 ENCOUNTER — Telehealth: Payer: Self-pay | Admitting: Cardiology

## 2016-03-15 NOTE — Telephone Encounter (Signed)
Pt's wife notified of echo results.

## 2016-03-15 NOTE — Telephone Encounter (Signed)
Follow-up      The wife is returning the nurses call.

## 2016-03-23 DIAGNOSIS — Z952 Presence of prosthetic heart valve: Secondary | ICD-10-CM | POA: Diagnosis not present

## 2016-03-23 DIAGNOSIS — E1140 Type 2 diabetes mellitus with diabetic neuropathy, unspecified: Secondary | ICD-10-CM | POA: Diagnosis not present

## 2016-03-23 DIAGNOSIS — I10 Essential (primary) hypertension: Secondary | ICD-10-CM | POA: Diagnosis not present

## 2016-03-23 DIAGNOSIS — E7800 Pure hypercholesterolemia, unspecified: Secondary | ICD-10-CM | POA: Diagnosis not present

## 2016-03-23 DIAGNOSIS — Z1389 Encounter for screening for other disorder: Secondary | ICD-10-CM | POA: Diagnosis not present

## 2016-03-23 DIAGNOSIS — Z0000 Encounter for general adult medical examination without abnormal findings: Secondary | ICD-10-CM | POA: Diagnosis not present

## 2016-03-23 DIAGNOSIS — G629 Polyneuropathy, unspecified: Secondary | ICD-10-CM | POA: Diagnosis not present

## 2016-03-23 DIAGNOSIS — E291 Testicular hypofunction: Secondary | ICD-10-CM | POA: Diagnosis not present

## 2016-03-23 DIAGNOSIS — C211 Malignant neoplasm of anal canal: Secondary | ICD-10-CM | POA: Diagnosis not present

## 2016-03-23 DIAGNOSIS — Z125 Encounter for screening for malignant neoplasm of prostate: Secondary | ICD-10-CM | POA: Diagnosis not present

## 2016-03-23 DIAGNOSIS — G4700 Insomnia, unspecified: Secondary | ICD-10-CM | POA: Diagnosis not present

## 2016-03-23 DIAGNOSIS — I2510 Atherosclerotic heart disease of native coronary artery without angina pectoris: Secondary | ICD-10-CM | POA: Diagnosis not present

## 2016-03-24 ENCOUNTER — Other Ambulatory Visit: Payer: Self-pay | Admitting: Family Medicine

## 2016-03-24 ENCOUNTER — Ambulatory Visit
Admission: RE | Admit: 2016-03-24 | Discharge: 2016-03-24 | Disposition: A | Discharge status: Home / Self Care | Payer: Medicare Other | Source: Ambulatory Visit | Attending: Family Medicine | Admitting: Family Medicine

## 2016-03-24 DIAGNOSIS — R0989 Other specified symptoms and signs involving the circulatory and respiratory systems: Secondary | ICD-10-CM | POA: Diagnosis not present

## 2016-04-11 DIAGNOSIS — E860 Dehydration: Secondary | ICD-10-CM | POA: Diagnosis not present

## 2016-05-08 ENCOUNTER — Other Ambulatory Visit: Payer: Self-pay | Admitting: Cardiology

## 2016-05-31 DIAGNOSIS — R05 Cough: Secondary | ICD-10-CM | POA: Diagnosis not present

## 2016-05-31 DIAGNOSIS — J069 Acute upper respiratory infection, unspecified: Secondary | ICD-10-CM | POA: Diagnosis not present

## 2016-05-31 DIAGNOSIS — B9789 Other viral agents as the cause of diseases classified elsewhere: Secondary | ICD-10-CM | POA: Diagnosis not present

## 2016-06-08 ENCOUNTER — Telehealth: Payer: Self-pay | Admitting: Cardiology

## 2016-06-08 NOTE — Telephone Encounter (Signed)
DPR on file to speak with Kirk [name] who is aware OK to use TENS unit.  She had no further questions.

## 2016-06-08 NOTE — Telephone Encounter (Signed)
New Message  Pt wife called requesting to speak with RN. pt wife states pt had been dealing with feet swelling and wants to use a TENS machine to help with the swelling and pt wife would like to know uf using this machine would be okay. Please call back to discuss

## 2016-06-27 DIAGNOSIS — Z23 Encounter for immunization: Secondary | ICD-10-CM | POA: Diagnosis not present

## 2016-08-15 ENCOUNTER — Telehealth: Payer: Self-pay | Admitting: Oncology

## 2016-08-15 ENCOUNTER — Ambulatory Visit (HOSPITAL_BASED_OUTPATIENT_CLINIC_OR_DEPARTMENT_OTHER): Payer: Medicare Other | Admitting: Oncology

## 2016-08-15 VITALS — BP 149/78 | HR 67 | Temp 98.4°F | Resp 17 | Ht 71.0 in | Wt 240.5 lb

## 2016-08-15 DIAGNOSIS — I2510 Atherosclerotic heart disease of native coronary artery without angina pectoris: Secondary | ICD-10-CM | POA: Diagnosis not present

## 2016-08-15 DIAGNOSIS — C211 Malignant neoplasm of anal canal: Secondary | ICD-10-CM

## 2016-08-15 NOTE — Telephone Encounter (Signed)
Gave patient avs report and appointments for june °

## 2016-08-15 NOTE — Progress Notes (Signed)
  [place] OFFICE PROGRESS NOTE   Diagnosis: Anal cancer  INTERVAL HISTORY:   Kirk [name] returns as scheduled. He reports feeling well. No difficulty with bowel function. He is working. He reports an intentional weight loss with dieting.  Objective:  Vital signs in last 24 hours:  Blood pressure (!) 149/78, pulse 67, temperature 98.4 F (36.9 C), temperature source Oral, resp. rate 17, height 5\' 11"  (1.803 m), weight 240 lb 8 oz (109.1 kg), SpO2 94 %.    HEENT: Neck without mass Lymphatics: No cervical, supra-clavicular, axillary, or inguinal nodes Resp: Lungs clear bilaterally Cardio: Regular rate and rhythm GI: No hepatomegaly, no mass, nontender Vascular: No leg edema Rectal: Perineum and anal verge without nodularity. Anal canal without a mass.     Medications: I have reviewed the patient's current medications.  Assessment/Plan: 1.Squamous cell carcinoma of the anal canal, HPV positive.  2. Staging CT/PET scan with tiny lung nodules, hypermetabolic retroperitoneal, right inguinal, and left supraclavicular nodes . FNA biopsy of a left supraclavicular lymph node on 05/03/2012 confirmed metastatic squamous cell carcinoma.  -Cycle 1 of 5-fluorouracil/mitomycin-C and concurrent radiation on 05/20/2012. Cycle 2 of 5-fluorouracil/mitomycin-C on 06/24/2012 with completion of radiation on 06/28/2012.  -Restaging PET scan 08/02/2012 with resolution of the hypermetabolic anal primary and right inguinal lymph node. Persistent hypermetabolic supraclavicular and retroperitoneal nodes.  -cycle 1 of salvage Taxol/carboplatin chemotherapy on 08/30/2012 . Restaging CTs after 4 cycles of Taxol/carboplatin confirmed resolution of retroperitoneal lymphadenopathy and a decrease in the size of a previously noted left supraclavicular node. He completed cycle 6 of Taxol/carboplatin on 01/02/2013.  -A negative flexible sigmoidoscopy 04/01/2013  -Staging CT scans 10/09/2013 with  stable small left supraclavicular node, no evidence of progressive metastatic disease  -Negative colonoscopy 03/19/2015 3. Hypermetabolic activity at the right supraglottic larynx on the PET scan 04/17/2012 with associated soft tissue fullness status post a negative ENT evaluation by Kirk [name].  4. History of aortic stenosis-status post aortic valve replacement and coronary artery bypass surgery in November 2012.  5. Mucositis following cycle 1 chemotherapy. Resolved.  6. Skin breakdown at the perineum and penis secondary to chemotherapy and radiation. Resolved.  7. 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 Bone pain after Neulasta  9. Generalized seizure 09/24/2012-negative brain CT,? Etiology. He has been evaluated by neurology and was prescribed Vimpat.  10.history of Thrombocytopenia secondary chemotherapy -improved  11. Peripheral neuropathy-predating chemotherapy  12. Erectile dysfunction-followed by Kirk [name]    Disposition:  Kirk [name] remains in clinical remission from anal cancer. He will return for an office visit in 6 months.  Betsy Coder, MD  08/15/2016  8:51 AM

## 2016-09-08 ENCOUNTER — Other Ambulatory Visit: Payer: Self-pay | Admitting: Nurse Practitioner

## 2016-10-09 ENCOUNTER — Ambulatory Visit
Admission: RE | Admit: 2016-10-09 | Discharge: 2016-10-09 | Disposition: A | Discharge status: Home / Self Care | Payer: Medicare Other | Source: Ambulatory Visit | Attending: Radiation Oncology | Admitting: Radiation Oncology

## 2016-10-09 ENCOUNTER — Encounter: Payer: Self-pay | Admitting: Radiation Oncology

## 2016-10-09 DIAGNOSIS — Z885 Allergy status to narcotic agent status: Secondary | ICD-10-CM | POA: Diagnosis not present

## 2016-10-09 DIAGNOSIS — Z7982 Long term (current) use of aspirin: Secondary | ICD-10-CM | POA: Insufficient documentation

## 2016-10-09 DIAGNOSIS — C211 Malignant neoplasm of anal canal: Secondary | ICD-10-CM | POA: Insufficient documentation

## 2016-10-09 DIAGNOSIS — Z923 Personal history of irradiation: Secondary | ICD-10-CM | POA: Insufficient documentation

## 2016-10-09 DIAGNOSIS — Z85048 Personal history of other malignant neoplasm of rectum, rectosigmoid junction, and anus: Secondary | ICD-10-CM | POA: Diagnosis not present

## 2016-10-09 DIAGNOSIS — Z79899 Other long term (current) drug therapy: Secondary | ICD-10-CM | POA: Diagnosis not present

## 2016-10-09 DIAGNOSIS — Z08 Encounter for follow-up examination after completed treatment for malignant neoplasm: Secondary | ICD-10-CM | POA: Diagnosis not present

## 2016-10-09 NOTE — Progress Notes (Signed)
Radiation Oncology         [phone] ________________________________  Name: Kirk [name] MRN: [id number]  Date: 10/09/2016  DOB: 11-Jun-1946  Follow-Up Visit Note  CC: Vena Austria, MD  Maury Dus, MD  Diagnosis: Squamous cell carcinoma of the anal canal  Interval Since Last Radiation: 4 years and 3 months  05/20/2012 through 06/28/2012: The patient was treated to the gross disease within the anal region in addition to regional lymphatics. The patient was treated to a dose of 54 gray at 1.8 gray per fraction in 30 fractions. His treatment consisted of a IMRT technique on her chemotherapy unit with daily image guidance.  Narrative:  Kirk [name] has returned today for reassessment s/p xrt for anal cancer. He last saw Dr. Benay Spice on 08/15/16. Rectal exam at that time revealed no nodularity of the perineum or anal verge and no masses of the anal canal. The patient has no complaints of pain, has regular bowels, a good appetite, and no fatigue. The patient reports he is working every day.  ALLERGIES:  is allergic to cat hair extract; atorvastatin; metformin; and oxycodone.  Meds: Current Outpatient Prescriptions  Medication Sig Dispense Refill  . amitriptyline (ELAVIL) 25 MG tablet Take 25 mg by mouth at bedtime.     Marland Kitchen aspirin EC 81 MG tablet Take 81 mg by mouth daily.    . Cholecalciferol 1000 UNITS tablet 2,000 Units. Take 185 mg by mouth daily.    . DULoxetine (CYMBALTA) 60 MG capsule Take 60 mg by mouth daily.    Marland Kitchen LIPITOR 80 MG tablet TAKE 1 TABLET BY MOUTH DAILY 90 tablet 3  . losartan (COZAAR) 50 MG tablet Take 1 tablet (50 mg total) by mouth daily. 90 tablet 3  . metoprolol tartrate (LOPRESSOR) 25 MG tablet TAKE 1 TABLET BY MOUTH TWICE A DAY 180 tablet 3  . Vitamins-Lipotropics (LIPO-FLAVONOID PLUS) TABS Take 1 tablet by mouth 3 (three) times daily after meals.      . saxagliptin HCl (ONGLYZA) 5 MG TABS tablet Take 5 mg by mouth daily.     No current  facility-administered medications for this encounter.     Physical Findings: The patient is in no acute distress. Patient is alert and oriented.  height is 5\' 11"  (1.803 m) and weight is 245 lb (111.1 kg). His oral temperature is 97.8 F (36.6 C). His blood pressure is 138/69 and his pulse is 57 (abnormal). His respiration is 20.   No inguinal lymphadenopathy present on either side.  Lab Findings: Lab Results  Component Value Date   WBC 5.0 02/25/2013   HGB 11.7 (L) 02/25/2013   HCT 34.5 (L) 02/25/2013   MCV 100.8 (H) 02/25/2013   PLT 126 (L) 02/25/2013    Radiographic Findings: No results found.  Impression: The patient is doing well. Clinically NED.  Plan: He will see Dr. Benay Spice in 6 months and radiation oncology in 9 months which would be at the 5 year mark after radiation.  I spent 15 minutes with the patient today, the majority of which was spent counseling the patient on the diagnosis of cancer and coordinating care.  ------------------------------------------------  Jodelle Gross, MD, PhD  This document serves as a record of services personally performed by Kyung Rudd, MD. It was created on his behalf by Darcus Austin, a trained medical scribe. The creation of this record is based on the scribe's personal observations and the provider's statements to them. This document has been checked and approved by the  attending provider.

## 2016-10-09 NOTE — Progress Notes (Signed)
Follow up  Anal canal radiation 05/20/12-06/28/12 no c/o pain, bowels regular,  No c/o or pain, appetite great,no fatiue 8:06 AM BP 138/69 (BP Location: Left Arm, Patient Position: Sitting, Cuff Size: Large)   Pulse (!) 57   Temp 97.8 F (36.6 C) (Oral)   Resp 20   Ht 5\' 11"  (1.803 m)   Wt 245 lb (111.1 kg)   BMI 34.17 kg/m   Wt Readings from Last 3 Encounters:  10/09/16 245 lb (111.1 kg)  08/15/16 240 lb 8 oz (109.1 kg)  02/17/16 241 lb (109.3 kg)

## 2016-10-31 DIAGNOSIS — Z952 Presence of prosthetic heart valve: Secondary | ICD-10-CM | POA: Diagnosis not present

## 2016-10-31 DIAGNOSIS — E1140 Type 2 diabetes mellitus with diabetic neuropathy, unspecified: Secondary | ICD-10-CM | POA: Diagnosis not present

## 2016-10-31 DIAGNOSIS — Z6835 Body mass index (BMI) 35.0-35.9, adult: Secondary | ICD-10-CM | POA: Diagnosis not present

## 2016-10-31 DIAGNOSIS — E291 Testicular hypofunction: Secondary | ICD-10-CM | POA: Diagnosis not present

## 2016-10-31 DIAGNOSIS — C211 Malignant neoplasm of anal canal: Secondary | ICD-10-CM | POA: Diagnosis not present

## 2016-10-31 DIAGNOSIS — G4700 Insomnia, unspecified: Secondary | ICD-10-CM | POA: Diagnosis not present

## 2016-10-31 DIAGNOSIS — I10 Essential (primary) hypertension: Secondary | ICD-10-CM | POA: Diagnosis not present

## 2016-10-31 DIAGNOSIS — E7800 Pure hypercholesterolemia, unspecified: Secondary | ICD-10-CM | POA: Diagnosis not present

## 2016-10-31 DIAGNOSIS — G629 Polyneuropathy, unspecified: Secondary | ICD-10-CM | POA: Diagnosis not present

## 2016-10-31 DIAGNOSIS — I2510 Atherosclerotic heart disease of native coronary artery without angina pectoris: Secondary | ICD-10-CM | POA: Diagnosis not present

## 2016-10-31 DIAGNOSIS — R0989 Other specified symptoms and signs involving the circulatory and respiratory systems: Secondary | ICD-10-CM | POA: Diagnosis not present

## 2017-01-15 DIAGNOSIS — N400 Enlarged prostate without lower urinary tract symptoms: Secondary | ICD-10-CM | POA: Diagnosis not present

## 2017-01-15 DIAGNOSIS — N5201 Erectile dysfunction due to arterial insufficiency: Secondary | ICD-10-CM | POA: Diagnosis not present

## 2017-02-06 ENCOUNTER — Encounter: Payer: Self-pay | Admitting: Cardiology

## 2017-02-13 ENCOUNTER — Ambulatory Visit (HOSPITAL_BASED_OUTPATIENT_CLINIC_OR_DEPARTMENT_OTHER): Payer: Medicare Other | Admitting: Oncology

## 2017-02-13 ENCOUNTER — Telehealth: Payer: Self-pay | Admitting: Oncology

## 2017-02-13 VITALS — BP 120/65 | HR 64 | Temp 98.4°F | Resp 18 | Ht 71.0 in | Wt 240.2 lb

## 2017-02-13 DIAGNOSIS — C211 Malignant neoplasm of anal canal: Secondary | ICD-10-CM | POA: Diagnosis not present

## 2017-02-13 NOTE — Telephone Encounter (Signed)
Gave patient AVS and calender per 6/12 los - f/u in 6 months.

## 2017-02-13 NOTE — Progress Notes (Signed)
  [place] OFFICE PROGRESS NOTE   Diagnosis: Anal cancer  INTERVAL HISTORY:   [name] returns as scheduled. He feels well. Good appetite and energy level. No difficulty with bowel function. No complaint.  Objective:  Vital signs in last 24 hours:  Blood pressure 120/65, pulse 64, temperature 98.4 F (36.9 C), temperature source Oral, resp. rate 18, height 5\' 11"  (1.803 m), weight 240 lb 3.2 oz (109 kg), SpO2 96 %.    HEENT: Neck without mass Lymphatics: No cervical, supraclavicular, axillary, or inguinal nodes Resp: Lungs clear bilaterally Cardio: Regular rate and rhythm GI: No hepatosplenomegaly, no mass, nontender. Examination of the perineum/anal verge reveals no mass, small external hemorrhoids Vascular: No leg edema  Medications: I have reviewed the patient's current medications.  Assessment/Plan: 1.Squamous cell carcinoma of the anal canal, HPV positive.  2. Staging CT/PET scan with tiny lung nodules, hypermetabolic retroperitoneal, right inguinal, and left supraclavicular nodes . FNA biopsy of a left supraclavicular lymph node on 05/03/2012 confirmed metastatic squamous cell carcinoma.  -Cycle 1 of 5-fluorouracil/mitomycin-C and concurrent radiation on 05/20/2012. Cycle 2 of 5-fluorouracil/mitomycin-C on 06/24/2012 with completion of radiation on 06/28/2012.  -Restaging PET scan 08/02/2012 with resolution of the hypermetabolic anal primary and right inguinal lymph node. Persistent hypermetabolic supraclavicular and retroperitoneal nodes.  -cycle 1 of salvage Taxol/carboplatin chemotherapy on 08/30/2012 . Restaging CTs after 4 cycles of Taxol/carboplatin confirmed resolution of retroperitoneal lymphadenopathy and a decrease in the size of a previously noted left supraclavicular node. He completed cycle 6 of Taxol/carboplatin on 01/02/2013.  -A negative flexible sigmoidoscopy 04/01/2013  -Staging CT scans 10/09/2013 with stable small left  supraclavicular node, no evidence of progressive metastatic disease  -Negative colonoscopy 03/19/2015 3. Hypermetabolic activity at the right supraglottic larynx on the PET scan 04/17/2012 with associated soft tissue fullness status post a negative ENT evaluation by Dr. Constance [name].  4. History of aortic stenosis-status post aortic valve replacement and coronary artery bypass surgery in November 2012.  5. Mucositis following cycle 1 chemotherapy. Resolved.  6. Skin breakdown at the perineum and penis secondary to chemotherapy and radiation. Resolved.  7. 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 Bone pain after Neulasta  9. Generalized seizure 09/24/2012-negative brain CT,? Etiology. He has been evaluated by neurology and was prescribed Vimpat.  10.history of Thrombocytopenia secondary chemotherapy -improved  11. Peripheral neuropathy-predating chemotherapy  12. Erectile dysfunction-followed by Dr. Risa [name]    Disposition:  [name] remains in clinical remission from anal cancer. He will return for an office visit in 6 months. He will see Dr. Lisbeth [name] in the interim.  [name], MD  02/13/2017  9:02 AM

## 2017-02-26 ENCOUNTER — Ambulatory Visit: Payer: Medicare Other | Admitting: Cardiology

## 2017-04-04 DIAGNOSIS — Z125 Encounter for screening for malignant neoplasm of prostate: Secondary | ICD-10-CM | POA: Diagnosis not present

## 2017-04-04 DIAGNOSIS — Z1159 Encounter for screening for other viral diseases: Secondary | ICD-10-CM | POA: Diagnosis not present

## 2017-04-04 DIAGNOSIS — I480 Paroxysmal atrial fibrillation: Secondary | ICD-10-CM | POA: Diagnosis not present

## 2017-04-04 DIAGNOSIS — E1140 Type 2 diabetes mellitus with diabetic neuropathy, unspecified: Secondary | ICD-10-CM | POA: Diagnosis not present

## 2017-04-04 DIAGNOSIS — Z1389 Encounter for screening for other disorder: Secondary | ICD-10-CM | POA: Diagnosis not present

## 2017-04-04 DIAGNOSIS — E291 Testicular hypofunction: Secondary | ICD-10-CM | POA: Diagnosis not present

## 2017-04-04 DIAGNOSIS — E7800 Pure hypercholesterolemia, unspecified: Secondary | ICD-10-CM | POA: Diagnosis not present

## 2017-04-04 DIAGNOSIS — Z0000 Encounter for general adult medical examination without abnormal findings: Secondary | ICD-10-CM | POA: Diagnosis not present

## 2017-04-04 DIAGNOSIS — I10 Essential (primary) hypertension: Secondary | ICD-10-CM | POA: Diagnosis not present

## 2017-04-04 DIAGNOSIS — Z85048 Personal history of other malignant neoplasm of rectum, rectosigmoid junction, and anus: Secondary | ICD-10-CM | POA: Diagnosis not present

## 2017-04-04 DIAGNOSIS — G629 Polyneuropathy, unspecified: Secondary | ICD-10-CM | POA: Diagnosis not present

## 2017-04-04 DIAGNOSIS — I2510 Atherosclerotic heart disease of native coronary artery without angina pectoris: Secondary | ICD-10-CM | POA: Diagnosis not present

## 2017-04-04 DIAGNOSIS — G4700 Insomnia, unspecified: Secondary | ICD-10-CM | POA: Diagnosis not present

## 2017-04-30 ENCOUNTER — Ambulatory Visit (INDEPENDENT_AMBULATORY_CARE_PROVIDER_SITE_OTHER): Payer: Medicare Other | Admitting: Cardiology

## 2017-04-30 ENCOUNTER — Encounter: Payer: Self-pay | Admitting: Cardiology

## 2017-04-30 VITALS — BP 130/80 | HR 53 | Ht 71.0 in | Wt 243.0 lb

## 2017-04-30 DIAGNOSIS — I2583 Coronary atherosclerosis due to lipid rich plaque: Secondary | ICD-10-CM

## 2017-04-30 DIAGNOSIS — Z951 Presence of aortocoronary bypass graft: Secondary | ICD-10-CM | POA: Diagnosis not present

## 2017-04-30 DIAGNOSIS — I2510 Atherosclerotic heart disease of native coronary artery without angina pectoris: Secondary | ICD-10-CM | POA: Diagnosis not present

## 2017-04-30 DIAGNOSIS — Z952 Presence of prosthetic heart valve: Secondary | ICD-10-CM | POA: Diagnosis not present

## 2017-04-30 NOTE — Progress Notes (Signed)
[place]. 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 Also has a supraclavicular and peri-abdominal aortic lymph node. Seen oncology at [facility] and had chemo/rad.   He is doing well from a cardiac standpoint. No shortness of breath. Cancer is in remission.   No changes in medications. LDL cholesterol 78 on 12/14, triglycerides 220 range. Trying to lose a little bit of weight. Has been up and down.  Shingles. Amotivational for a while. Grapey. Injection for ED.  Kirk [name] is his wife. 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  He has been battling a cold. Overall doing well. His wife unfortunately lost her mother. Father is 55 years old.   Wt Readings from Last 3 Encounters:  04/30/17 243 lb (110.2 kg)  02/13/17 240 lb 3.2 oz (109 kg)  10/09/16 245 lb (111.1 kg)     Past Medical History:  Diagnosis Date  . Allergy   . Angina    6 wks. ago  . Aortic stenosis   . Arthritis    1995- cerv. fusion- [facility]  . Blood in stool   . Cancer ([place]) 04/09/12   Rectum bx=invasive squamous cell carcinoma  . CHF (congestive heart failure) ([place])   . Coronary artery disease    aortic stenosis, CAD  . Diabetes mellitus    Type 2  . ED (erectile dysfunction)   . ED (erectile dysfunction)   . Elevated cholesterol   . Heart murmur   . History of radiation therapy 05/20/12-06/28/12   anal cancer=54gy total dose  . Kidney stones    hx  . Myocardial infarction ([place]) 2012  . Rectal bleeding   . Seizure disorder, grand mal ([place])   . Seizures ([place])    after taking Oxycodone; not prescribed seizure med  .  Shortness of breath   . Thrombosed hemorrhoids     Past Surgical History:  Procedure Laterality Date  . ANAL FISSURE REPAIR     patient unsure of date  . AORTIC VALVE REPLACEMENT  08/11/2011   Procedure: AORTIC VALVE REPLACEMENT (AVR);  Surgeon: Rexene Alberts, MD;  Location: [place];  Service: Open Heart Surgery;  Laterality: N/A;  . [place]  . COLONOSCOPY    . CORONARY ARTERY BYPASS GRAFT  08/16/2011   Procedure: CORONARY ARTERY BYPASS GRAFTING (CABG);  Surgeon: Rexene Alberts, MD;  Location: [place];  Service: Open Heart Surgery;  Laterality: N/A;  CABG times one using left internal mammary artery  . LITHOTRIPSY  2001 AND 2002   DR PETERSON    Current Outpatient Prescriptions  Medication Sig Dispense Refill  . amitriptyline (ELAVIL) 25 MG tablet Take 25 mg by mouth at bedtime.     Marland Kitchen aspirin EC 81 MG tablet Take 81 mg by mouth daily.    . Cholecalciferol 1000 UNITS tablet 2,000 Units. Take 185 mg by mouth daily.    . DULoxetine (CYMBALTA) 60 MG capsule Take 60 mg by mouth daily.    Marland Kitchen LIPITOR 80 MG tablet TAKE 1 TABLET BY MOUTH  DAILY 90 tablet 3  . losartan (COZAAR) 50 MG tablet Take 1 tablet (50 mg total) by mouth daily. 90 tablet 3  . metoprolol tartrate (LOPRESSOR) 25 MG tablet TAKE 1 TABLET BY MOUTH TWICE A DAY 180 tablet 3  . saxagliptin HCl (ONGLYZA) 5 MG TABS tablet Take 5 mg by mouth daily.    . Vitamins-Lipotropics (LIPO-FLAVONOID PLUS) TABS Take 1 tablet by mouth 3 (three) times daily after meals.       No current facility-administered medications for this visit.     Allergies:    Allergies  Allergen Reactions  . Cat Hair Extract Shortness Of Breath and Swelling  . Atorvastatin Other (See Comments)    Pt has muscle ache on Atorvastatin but IS ABLE TO TAKE LIPITOR without any issues  . Metformin Other (See Comments)    Diarrhea, muscle cramps  . Oxycodone Other (See Comments)    seizure    Social History:  The patient  reports that he has  never smoked. He has never used smokeless tobacco. He reports that he drinks alcohol. He reports that he does not use drugs.  Kirk [name] is his wife. Kirk [name].  ROS:  Please see the history of present illness.     PHYSICAL EXAM: VS:  BP 130/80   Pulse (!) 53   Ht 5\' 11"  (1.803 m)   Wt 243 lb (110.2 kg)   BMI 33.89 kg/m  Well nourished, well developed, in no acute distress HEENT: normal Neck: no JVD Cardiac:  normal S1, S2; RRR; soft systolic murmur Lungs:  clear to auscultation bilaterally, no wheezing, rhonchi or rales Abd: soft, nontender, no hepatomegalyoverweight Ext: no edema Skin: warm and dry Neuro: no focal abnormalities noted  EKG:    Today: 04/30/17-sinus bradycardia rate 53 with no other abnormalities. 02/17/16 - SB 51 no changes. 02/04/15-sinus or bradycardia rate 56 with sinus arrhythmia, otherwise normal. 03/03/14-sinus bradycardia rate 55, incomplete left bundle branch block     ECHO 03/14/16: - Left ventricle: The cavity size was normal. There was moderate   focal basal and mild concentric hypertrophy. Systolic function   was normal. The estimated ejection fraction was in the range of   60% to 65%. Wall motion was normal; there were no regional wall   motion abnormalities. Features are consistent with a pseudonormal   left ventricular filling pattern, with concomitant abnormal   relaxation and increased filling pressure (grade 2 diastolic   dysfunction). Doppler parameters are consistent with high   ventricular filling pressure. - Aortic valve: A bioprosthesis was present. There was mild to   moderate stenosis. - Aorta: Aortic root dimension: 38 mm (ED). - Aortic root: The aortic root was mildly dilated. - Mitral valve: Severely calcified annulus. There was trivial   regurgitation.  ASSESSMENT AND PLAN:  1. Coronary artery disease-status post bypass. Doing very well, no anginal symptoms. Bypass was done in the setting of aortic valve replacement. Mild cough with  cold. 2. Aortic valve replacement-bioprosthetic-doing very well. Soft murmur heard.  Dental antibiotics, echocardiogram reviewed once again 2017.. 3. ED - Grapey, injection. Working well 4. Dysthymia - feeling better now. Kirk [name], working hard currently. Enjoys fishing. Seems to be stable. 5. Hyperlipidemia-excellent lipid results however triglycerides are mildly elevated. Weight loss, diet. Continue with statin therapy. Continue with current therapy. 6. Obesity-continue with weight loss. Recently lost 8 pounds. Activity. This will also help diabetes. Discussed once again. 7. 1 year follow up.   Signed, [name], MD [facility]  04/30/2017 10:58 AM

## 2017-04-30 NOTE — Patient Instructions (Addendum)

## 2017-05-10 ENCOUNTER — Other Ambulatory Visit: Payer: Self-pay | Admitting: Cardiology

## 2017-07-05 NOTE — Progress Notes (Signed)
Kirk [name] 71 y.o. Mmn with squamous cell carcinoma of the anal canal,FU.  Pain:Denies having pain. Nausea/ Vomiting:No Diarrhea:No Skin irritation:None Rectal bleeding: No                   Bowel movements daily. Fatigue:Denies fatigue. Loss of appetite:Good. Weight: Wt Readings from Last 3 Encounters:  04/30/17 243 lb (110.2 kg)  02/13/17 240 lb 3.2 oz (109 kg)  10/09/16 245 lb (111.1 kg)  BP 115/60   Pulse 70   Temp 97.9 F (36.6 C) (Oral)   Resp 20   Ht 5\' 11"  (1.803 m)   Wt 244 lb (110.7 kg)   SpO2 97%   BMI 34.03 kg/m

## 2017-07-06 DIAGNOSIS — Z23 Encounter for immunization: Secondary | ICD-10-CM | POA: Diagnosis not present

## 2017-07-09 ENCOUNTER — Ambulatory Visit
Admission: RE | Admit: 2017-07-09 | Discharge: 2017-07-09 | Disposition: A | Discharge status: Home / Self Care | Payer: Medicare Other | Source: Ambulatory Visit | Attending: Radiation Oncology | Admitting: Radiation Oncology

## 2017-07-09 VITALS — BP 115/60 | HR 70 | Temp 97.9°F | Resp 20 | Ht 71.0 in | Wt 244.0 lb

## 2017-07-09 DIAGNOSIS — I350 Nonrheumatic aortic (valve) stenosis: Secondary | ICD-10-CM | POA: Insufficient documentation

## 2017-07-09 DIAGNOSIS — R011 Cardiac murmur, unspecified: Secondary | ICD-10-CM | POA: Diagnosis not present

## 2017-07-09 DIAGNOSIS — R0602 Shortness of breath: Secondary | ICD-10-CM | POA: Insufficient documentation

## 2017-07-09 DIAGNOSIS — Z923 Personal history of irradiation: Secondary | ICD-10-CM | POA: Insufficient documentation

## 2017-07-09 DIAGNOSIS — Z9221 Personal history of antineoplastic chemotherapy: Secondary | ICD-10-CM | POA: Diagnosis not present

## 2017-07-09 DIAGNOSIS — M129 Arthropathy, unspecified: Secondary | ICD-10-CM | POA: Diagnosis not present

## 2017-07-09 DIAGNOSIS — Z85048 Personal history of other malignant neoplasm of rectum, rectosigmoid junction, and anus: Secondary | ICD-10-CM | POA: Diagnosis not present

## 2017-07-09 DIAGNOSIS — E119 Type 2 diabetes mellitus without complications: Secondary | ICD-10-CM | POA: Diagnosis not present

## 2017-07-09 DIAGNOSIS — Z803 Family history of malignant neoplasm of breast: Secondary | ICD-10-CM | POA: Insufficient documentation

## 2017-07-09 DIAGNOSIS — Z8669 Personal history of other diseases of the nervous system and sense organs: Secondary | ICD-10-CM | POA: Insufficient documentation

## 2017-07-09 DIAGNOSIS — Z7982 Long term (current) use of aspirin: Secondary | ICD-10-CM | POA: Diagnosis not present

## 2017-07-09 DIAGNOSIS — N529 Male erectile dysfunction, unspecified: Secondary | ICD-10-CM | POA: Diagnosis not present

## 2017-07-09 DIAGNOSIS — C211 Malignant neoplasm of anal canal: Secondary | ICD-10-CM

## 2017-07-09 DIAGNOSIS — E7800 Pure hypercholesterolemia, unspecified: Secondary | ICD-10-CM | POA: Insufficient documentation

## 2017-07-09 DIAGNOSIS — I2510 Atherosclerotic heart disease of native coronary artery without angina pectoris: Secondary | ICD-10-CM | POA: Diagnosis not present

## 2017-07-09 DIAGNOSIS — Z79899 Other long term (current) drug therapy: Secondary | ICD-10-CM | POA: Diagnosis not present

## 2017-07-09 DIAGNOSIS — Z08 Encounter for follow-up examination after completed treatment for malignant neoplasm: Secondary | ICD-10-CM | POA: Diagnosis not present

## 2017-07-09 DIAGNOSIS — Z87442 Personal history of urinary calculi: Secondary | ICD-10-CM | POA: Diagnosis not present

## 2017-07-09 DIAGNOSIS — I252 Old myocardial infarction: Secondary | ICD-10-CM | POA: Diagnosis not present

## 2017-07-11 ENCOUNTER — Encounter: Payer: Self-pay | Admitting: *Deleted

## 2017-07-11 ENCOUNTER — Other Ambulatory Visit: Payer: Self-pay | Admitting: Radiation Oncology

## 2017-07-11 DIAGNOSIS — C211 Malignant neoplasm of anal canal: Secondary | ICD-10-CM

## 2017-07-11 NOTE — Progress Notes (Signed)
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  Interval Since Last Radiation:  5 years  05/20/2012 through 06/28/2012:  The patient was treated to the gross disease within the anal region in addition to regional lymphatics. The patient was treated to a dose of 54 gray at 1.8 gray per fraction in 30 fractions. His treatment consisted of a IMRT technique.   Narrative:  The patient returns today for routine follow-up.  In summary this is a pleasant 71 y.o. gentleman who received concurrent chemoRT for squamous cell carcinoma of the anus. He has been followed since treatment in 2013 without recurrent disease. He saw Dr. Benay Spice in June and had a normal examination.                       On review of systems, the patient reports that he is doing well overall. He denies any chest pain, shortness of breath, cough, fevers, chills, night sweats, unintended weight changes. He denies any bowel or bladder disturbances, and denies abdominal pain, nausea or vomiting. He denies any rectal bleeding, itching, or pain. He reports some erectile dysfunction and is offered evaluation with urology but is not planning on  denies any new musculoskeletal or joint aches or pains, new skin lesions or concerns. A complete review of systems is obtained and is otherwise negative.   Past Medical History:  Past Medical History:  Diagnosis Date  . Allergy   . Angina    6 wks. ago  . Aortic stenosis   . Arthritis    1995- cerv. fusion- [facility]  . Blood in stool   . Cancer ([place]) 04/09/12   Rectum bx=invasive squamous cell carcinoma  . CHF (congestive heart failure) ([place])   . Coronary artery disease    aortic stenosis, CAD  . Diabetes mellitus    Type 2  . ED (erectile  dysfunction)   . ED (erectile dysfunction)   . Elevated cholesterol   . Heart murmur   . History of radiation therapy 05/20/12-06/28/12   anal cancer=54gy total dose  . Kidney stones    hx  . Myocardial infarction ([place]) 2012  . Rectal bleeding   . Seizure disorder, grand mal ([place])   . Seizures ([place])    after taking Oxycodone; not prescribed seizure med  . Shortness of breath   . Thrombosed hemorrhoids     Past Surgical History: Past Surgical History:  Procedure Laterality Date  . ANAL FISSURE REPAIR     patient unsure of date  . AORTIC VALVE REPLACEMENT  08/11/2011   Procedure: AORTIC VALVE REPLACEMENT (AVR);  Surgeon: Rexene Alberts, MD;  Location: [facility];  Service: Open Heart Surgery;  Laterality: N/A;  . [place]  . COLONOSCOPY    . LITHOTRIPSY  2001 AND 2002   DR PETERSON    Social History:  Social History   Socioeconomic History  . Marital status: Married    Spouse name: Not on file  . Number of children: Not on file  . Years of education: Not on file  . Highest education level: Not on file  Social Needs  . Financial resource strain: Not on file  . Food  insecurity - worry: Not on file  . Food insecurity - inability: Not on file  . Transportation needs - medical: Not on file  . Transportation needs - non-medical: Not on file  Occupational History  . Not on file  Tobacco Use  . Smoking status: Never Smoker  . Smokeless tobacco: Never Used  Substance and Sexual Activity  . Alcohol use: Yes    Alcohol/week: 0.0 oz    Comment: RARE  . Drug use: No  . Sexual activity: Not on file  Other Topics Concern  . Not on file  Social History Narrative   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, Jenelle Mages painter  The patient lives in [place]. He enjoys fishing.  Family History: Family History  Problem Relation Age of Onset  . Cancer Mother        breast  . Heart attack Father   . Anesthesia problems Neg Hx   . Hypotension Neg Hx   . Malignant  hyperthermia Neg Hx   . Pseudochol deficiency Neg Hx   . Colon cancer Neg Hx      ALLERGIES:  is allergic to cat hair extract; atorvastatin; metformin; and oxycodone.  Meds: Current Outpatient Medications  Medication Sig Dispense Refill  . amitriptyline (ELAVIL) 25 MG tablet Take 25 mg by mouth at bedtime.     Marland Kitchen aspirin EC 81 MG tablet Take 81 mg by mouth daily.    . Cholecalciferol 1000 UNITS tablet 2,000 Units. Take 185 mg by mouth daily.    . DULoxetine (CYMBALTA) 60 MG capsule Take 60 mg by mouth daily.    Marland Kitchen LIPITOR 80 MG tablet TAKE 1 TABLET BY MOUTH DAILY 90 tablet 3  . losartan (COZAAR) 50 MG tablet Take 1 tablet (50 mg total) by mouth daily. 90 tablet 3  . metoprolol tartrate (LOPRESSOR) 25 MG tablet TAKE 1 TABLET BY MOUTH TWICE A DAY 180 tablet 3  . saxagliptin HCl (ONGLYZA) 5 MG TABS tablet Take 5 mg by mouth daily.    . Vitamins-Lipotropics (LIPO-FLAVONOID PLUS) TABS Take 1 tablet by mouth 3 (three) times daily after meals.       No current facility-administered medications for this encounter.     Physical Findings:  height is 5\' 11"  (1.803 m) and weight is 244 lb (110.7 kg). His oral temperature is 97.9 F (36.6 C). His blood pressure is 115/60 and his pulse is 70. His respiration is 20 and oxygen saturation is 97%.  Pain Assessment Pain Score: 0-No pain/10 In general this is a well appearing caucasian male in no acute distress. He is alert and oriented x4 and appropriate throughout the examination. HEENT reveals that the patient is normocephalic, atraumatic. EOMs are intact. PERRLA. Skin is intact without any evidence of gross lesions. Cardiovascular exam reveals a regular rate and rhythm, no clicks rubs or murmurs are auscultated. Chest is clear to auscultation bilaterally. Lymphatic assessment is performed and does not reveal any adenopathy in the cervical, supraclavicular, axillary, or inguinal chains. Abdomen has active bowel sounds in all quadrants and is intact. The  abdomen is soft, non tender, non distended. Lower extremities are negative for pretibial pitting edema, deep calf tenderness, cyanosis or clubbing. Inspection of the anus was performed and does not reveal any changes externally of concern. He has a small area of thickening along the right thigh that is about 1 cm and no evidence of erythema or ulceration is noted. He describes this area as a dry patch that has a splinter deep to the  skin. Digital rectal exam is negative for palpable mass or induration of the anus or distal rectum.   Lab Findings: Lab Results  Component Value Date   WBC 5.0 02/25/2013   HGB 11.7 (L) 02/25/2013   HCT 34.5 (L) 02/25/2013   MCV 100.8 (H) 02/25/2013   PLT 126 (L) 02/25/2013     Radiographic Findings: No results found.  Impression/Plan: 1. 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 The patient appears to be clinically without evidence of disease. He has not had anoscopy or colonoscopy evaluation since 2016. I recommended that he meet with GI to establish his care and for him to undergo evaluation endoscopically/anoscopically this year. He is in agreement. He will return in 1 year to see me in one year or sooner if he has questions, and will continue to see Dr. Benay Spice as well for surveillance.     Carola Rhine, PAC

## 2017-07-11 NOTE — Progress Notes (Signed)
[place] with Shona Simpson, PA-C  made aware that insurance will not pay for a colonoscopy until again until 2019 since the  last one was 2016 and it was normal per the [place] office.  Mrs. Hoback wanted to know if it is necessary to do the GI consult. 36 Called Mrs. Cyphers back and got her voicemail asked that the GI consult is attended  Because Mr. Khim needs to see a GI doctor to review his history and he may need to do an exam in the office as part of the visit, not a colonoscopy procedure. Asked her to call me back at [phone] if they have any questions to call back.

## 2017-07-16 ENCOUNTER — Telehealth: Payer: Self-pay | Admitting: Gastroenterology

## 2017-07-16 ENCOUNTER — Telehealth: Payer: Self-pay | Admitting: Internal Medicine

## 2017-07-16 NOTE — Telephone Encounter (Signed)
Please schedule with an APP first available.

## 2017-07-16 NOTE — Telephone Encounter (Signed)
Made in Error

## 2017-07-16 NOTE — Telephone Encounter (Signed)
Received referral for patient to be seen for Malignant neoplasm of anal canal ([place]) from cancer center. Please see notes from referral and advise on scheduling. Former Dr.Kaplan pt. Dr.Perry DOD for referral date 11.07.2018

## 2017-07-17 NOTE — Telephone Encounter (Signed)
Left message for patient or wife to call back and schedule ov from referral.

## 2017-07-22 DIAGNOSIS — R1032 Left lower quadrant pain: Secondary | ICD-10-CM | POA: Diagnosis not present

## 2017-08-03 ENCOUNTER — Ambulatory Visit: Payer: Medicare Other | Admitting: Gastroenterology

## 2017-08-15 ENCOUNTER — Telehealth: Payer: Self-pay | Admitting: Oncology

## 2017-08-15 ENCOUNTER — Encounter: Payer: Self-pay | Admitting: Nurse Practitioner

## 2017-08-15 ENCOUNTER — Ambulatory Visit (HOSPITAL_BASED_OUTPATIENT_CLINIC_OR_DEPARTMENT_OTHER): Payer: Medicare Other | Admitting: Nurse Practitioner

## 2017-08-15 VITALS — BP 148/75 | HR 63 | Temp 98.5°F | Resp 20 | Ht 71.0 in | Wt 244.7 lb

## 2017-08-15 DIAGNOSIS — I2510 Atherosclerotic heart disease of native coronary artery without angina pectoris: Secondary | ICD-10-CM

## 2017-08-15 DIAGNOSIS — C211 Malignant neoplasm of anal canal: Secondary | ICD-10-CM

## 2017-08-15 NOTE — Telephone Encounter (Signed)
Gave avs and calendar for December 2019 °

## 2017-08-15 NOTE — Progress Notes (Addendum)
[facility] OFFICE PROGRESS NOTE   Diagnosis:  Anal cancer  INTERVAL HISTORY:   Mr. Maland returns as scheduled.  He feels well.  Bowels moving regularly.  No bleeding or pain with bowel movements.  He has a good appetite.  Objective:  Vital signs in last 24 hours:  Blood pressure (!) 148/75, pulse 63, temperature 98.5 F (36.9 C), temperature source Oral, resp. rate 20, height 5\' 11"  (1.803 m), weight 244 lb 11.2 oz (111 kg), SpO2 97 %.    HEENT: Neck without mass. Lymphatics: No palpable cervical, supraclavicular, axillary or inguinal lymph nodes. Resp: Lungs clear bilaterally. Cardio: Regular rate and rhythm. GI: Abdomen soft and nontender.  No hepatomegaly.  Perineum/anal verge without mass.  Rectal exam not performed due to recent DRE by Worthy Flank, PA. Vascular: No leg edema. Skin: Approximate half centimeter patch of dry appearing skin left buttock.   Lab Results:  Lab Results  Component Value Date   WBC 5.0 02/25/2013   HGB 11.7 (L) 02/25/2013   HCT 34.5 (L) 02/25/2013   MCV 100.8 (H) 02/25/2013   PLT 126 (L) 02/25/2013   NEUTROABS 3.4 02/25/2013    Imaging:  No results found.  Medications: I have reviewed the patient's current medications.  Assessment/Plan: 1.Squamous cell carcinoma of the anal canal, HPV positive.  2. Staging CT/PET scan with tiny lung nodules, hypermetabolic retroperitoneal, right inguinal, and left supraclavicular nodes . FNA biopsy of a left supraclavicular lymph node on 05/03/2012 confirmed metastatic squamous cell carcinoma.  -Cycle 1 of 5-fluorouracil/mitomycin-C and concurrent radiation on 05/20/2012. Cycle 2 of 5-fluorouracil/mitomycin-C on 06/24/2012 with completion of radiation on 06/28/2012.  -Restaging PET scan 08/02/2012 with resolution of the hypermetabolic anal primary and right inguinal lymph node. Persistent hypermetabolic supraclavicular and retroperitoneal nodes.  -cycle 1 of salvage  Taxol/carboplatin chemotherapy on 08/30/2012 . Restaging CTs after 4 cycles of Taxol/carboplatin confirmed resolution of retroperitoneal lymphadenopathy and a decrease in the size of a previously noted left supraclavicular node. He completed cycle 6 of Taxol/carboplatin on 01/02/2013.  -A negative flexible sigmoidoscopy 04/01/2013  -Staging CT scans 10/09/2013 with stable small left supraclavicular node, no evidence of progressive metastatic disease  -Negative colonoscopy 03/19/2015 3. Hypermetabolic activity at the right supraglottic larynx on the PET scan 04/17/2012 with associated soft tissue fullness status post a negative ENT evaluation by Dr. Constance Holster.  4. History of aortic stenosis-status post aortic valve replacement and coronary artery bypass surgery in November 2012.  5. Mucositis following cycle 1 chemotherapy. Resolved.  6. Skin breakdown at the perineum and penis secondary to chemotherapy and radiation. Resolved.  7. 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 Bone pain after Neulasta  9. Generalized seizure 09/24/2012-negative brain CT,? Etiology. He has been evaluated by neurology and was prescribed Vimpat.  10.history of Thrombocytopenia secondary chemotherapy -improved  11. Peripheral neuropathy-predating chemotherapy  12. Erectile dysfunction-followed by Dr. Risa Grill     Disposition: Mr. Wandel remains in clinical remission from anal cancer.  He will return for a follow-up visit in 1 year.  He has an appointment with Dr. Loletha Carrow in January 2019 to discuss timing of next anoscopy/colonoscopy.  Patient seen with Dr. Benay Spice.    Ned Card ANP/GNP-BC   08/15/2017  9:00 AM  This was a shared visit with Ned Card.  Mr. Sjogren remains in clinical remission from anal cancer.  He will see Dr. Loletha Carrow to discuss the indication for a surveillance endoscopy and screening colonoscopy.  He will return for an office visit in  1 year.  He will contact us in the  interim for new symptoms.  Julieanne Manson, MD

## 2017-09-12 ENCOUNTER — Ambulatory Visit: Payer: Medicare Other | Admitting: Gastroenterology

## 2017-10-05 DIAGNOSIS — N183 Chronic kidney disease, stage 3 (moderate): Secondary | ICD-10-CM | POA: Diagnosis not present

## 2017-10-05 DIAGNOSIS — E1140 Type 2 diabetes mellitus with diabetic neuropathy, unspecified: Secondary | ICD-10-CM | POA: Diagnosis not present

## 2017-10-05 DIAGNOSIS — E291 Testicular hypofunction: Secondary | ICD-10-CM | POA: Diagnosis not present

## 2017-10-05 DIAGNOSIS — G629 Polyneuropathy, unspecified: Secondary | ICD-10-CM | POA: Diagnosis not present

## 2017-10-05 DIAGNOSIS — G4700 Insomnia, unspecified: Secondary | ICD-10-CM | POA: Diagnosis not present

## 2017-10-05 DIAGNOSIS — E7800 Pure hypercholesterolemia, unspecified: Secondary | ICD-10-CM | POA: Diagnosis not present

## 2017-10-05 DIAGNOSIS — I480 Paroxysmal atrial fibrillation: Secondary | ICD-10-CM | POA: Diagnosis not present

## 2017-10-05 DIAGNOSIS — Z952 Presence of prosthetic heart valve: Secondary | ICD-10-CM | POA: Diagnosis not present

## 2017-10-05 DIAGNOSIS — Z6834 Body mass index (BMI) 34.0-34.9, adult: Secondary | ICD-10-CM | POA: Diagnosis not present

## 2017-10-05 DIAGNOSIS — R0989 Other specified symptoms and signs involving the circulatory and respiratory systems: Secondary | ICD-10-CM | POA: Diagnosis not present

## 2017-10-05 DIAGNOSIS — I10 Essential (primary) hypertension: Secondary | ICD-10-CM | POA: Diagnosis not present

## 2017-10-05 DIAGNOSIS — I2510 Atherosclerotic heart disease of native coronary artery without angina pectoris: Secondary | ICD-10-CM | POA: Diagnosis not present

## 2017-10-18 DIAGNOSIS — E7800 Pure hypercholesterolemia, unspecified: Secondary | ICD-10-CM | POA: Diagnosis not present

## 2017-10-18 DIAGNOSIS — E1140 Type 2 diabetes mellitus with diabetic neuropathy, unspecified: Secondary | ICD-10-CM | POA: Diagnosis not present

## 2017-10-18 DIAGNOSIS — E291 Testicular hypofunction: Secondary | ICD-10-CM | POA: Diagnosis not present

## 2017-12-25 DIAGNOSIS — M791 Myalgia, unspecified site: Secondary | ICD-10-CM | POA: Diagnosis not present

## 2017-12-25 DIAGNOSIS — R531 Weakness: Secondary | ICD-10-CM | POA: Diagnosis not present

## 2018-01-08 ENCOUNTER — Encounter (HOSPITAL_COMMUNITY): Payer: Self-pay | Admitting: Emergency Medicine

## 2018-01-08 ENCOUNTER — Emergency Department (HOSPITAL_COMMUNITY): Payer: Medicare Other

## 2018-01-08 ENCOUNTER — Emergency Department (HOSPITAL_COMMUNITY)
Admission: EM | Admit: 2018-01-08 | Discharge: 2018-01-08 | Disposition: A | Discharge status: Home / Self Care | Payer: Medicare Other | Attending: Emergency Medicine | Admitting: Emergency Medicine

## 2018-01-08 ENCOUNTER — Other Ambulatory Visit: Payer: Self-pay

## 2018-01-08 DIAGNOSIS — Z952 Presence of prosthetic heart valve: Secondary | ICD-10-CM | POA: Diagnosis not present

## 2018-01-08 DIAGNOSIS — I2510 Atherosclerotic heart disease of native coronary artery without angina pectoris: Secondary | ICD-10-CM | POA: Diagnosis not present

## 2018-01-08 DIAGNOSIS — Z85048 Personal history of other malignant neoplasm of rectum, rectosigmoid junction, and anus: Secondary | ICD-10-CM | POA: Insufficient documentation

## 2018-01-08 DIAGNOSIS — Z7982 Long term (current) use of aspirin: Secondary | ICD-10-CM | POA: Diagnosis not present

## 2018-01-08 DIAGNOSIS — M5441 Lumbago with sciatica, right side: Secondary | ICD-10-CM

## 2018-01-08 DIAGNOSIS — Z951 Presence of aortocoronary bypass graft: Secondary | ICD-10-CM | POA: Diagnosis not present

## 2018-01-08 DIAGNOSIS — I252 Old myocardial infarction: Secondary | ICD-10-CM | POA: Diagnosis not present

## 2018-01-08 DIAGNOSIS — Z79899 Other long term (current) drug therapy: Secondary | ICD-10-CM | POA: Diagnosis not present

## 2018-01-08 DIAGNOSIS — M549 Dorsalgia, unspecified: Secondary | ICD-10-CM | POA: Diagnosis not present

## 2018-01-08 DIAGNOSIS — M5442 Lumbago with sciatica, left side: Secondary | ICD-10-CM | POA: Diagnosis not present

## 2018-01-08 DIAGNOSIS — M5126 Other intervertebral disc displacement, lumbar region: Secondary | ICD-10-CM | POA: Diagnosis not present

## 2018-01-08 DIAGNOSIS — M545 Low back pain: Secondary | ICD-10-CM | POA: Diagnosis present

## 2018-01-08 DIAGNOSIS — E119 Type 2 diabetes mellitus without complications: Secondary | ICD-10-CM | POA: Insufficient documentation

## 2018-01-08 DIAGNOSIS — I509 Heart failure, unspecified: Secondary | ICD-10-CM | POA: Insufficient documentation

## 2018-01-08 LAB — COMPREHENSIVE METABOLIC PANEL
ALBUMIN: 4.1 g/dL (ref 3.5–5.0)
ALK PHOS: 69 U/L (ref 38–126)
ALT: 44 U/L (ref 17–63)
AST: 38 U/L (ref 15–41)
Anion gap: 10 (ref 5–15)
BUN: 12 mg/dL (ref 6–20)
CHLORIDE: 103 mmol/L (ref 101–111)
CO2: 27 mmol/L (ref 22–32)
CREATININE: 0.92 mg/dL (ref 0.61–1.24)
Calcium: 9.4 mg/dL (ref 8.9–10.3)
GFR calc non Af Amer: >60 mL/min (ref >60)
GLUCOSE: 99 mg/dL (ref 65–99)
Potassium: 4.4 mmol/L (ref 3.5–5.1)
SODIUM: 140 mmol/L (ref 135–145)
Total Bilirubin: 0.8 mg/dL (ref 0.3–1.2)
Total Protein: 7.2 g/dL (ref 6.5–8.1)

## 2018-01-08 LAB — URINALYSIS, ROUTINE W REFLEX MICROSCOPIC
BILIRUBIN URINE: NEGATIVE
GLUCOSE, UA: NEGATIVE mg/dL
Hgb urine dipstick: NEGATIVE
Ketones, ur: NEGATIVE mg/dL
Leukocytes, UA: NEGATIVE
NITRITE: NEGATIVE
PH: 5 (ref 5.0–8.0)
Protein, ur: NEGATIVE mg/dL
SPECIFIC GRAVITY, URINE: 1.009 (ref 1.005–1.030)

## 2018-01-08 LAB — CBC WITH DIFFERENTIAL/PLATELET
BASOS ABS: 0 10*3/uL (ref 0.0–0.1)
BASOS PCT: 0 %
EOS ABS: 0.3 10*3/uL (ref 0.0–0.7)
EOS PCT: 4 %
HCT: 44.6 % (ref 39.0–52.0)
HEMOGLOBIN: 14.8 g/dL (ref 13.0–17.0)
Lymphocytes Relative: 15 %
Lymphs Abs: 1.2 10*3/uL (ref 0.7–4.0)
MCH: 32.4 pg (ref 26.0–34.0)
MCHC: 33.2 g/dL (ref 30.0–36.0)
MCV: 97.6 fL (ref 78.0–100.0)
Monocytes Absolute: 0.6 10*3/uL (ref 0.1–1.0)
Monocytes Relative: 8 %
NEUTROS PCT: 73 %
Neutro Abs: 6.3 10*3/uL (ref 1.7–7.7)
PLATELETS: 183 10*3/uL (ref 150–400)
RBC: 4.57 MIL/uL (ref 4.22–5.81)
RDW: 13.8 % (ref 11.5–15.5)
WBC: 8.4 10*3/uL (ref 4.0–10.5)

## 2018-01-08 MED ORDER — CYCLOBENZAPRINE HCL 10 MG PO TABS: 10.0000 mg | ORAL_TABLET | Freq: Two times a day (BID) | ORAL | 0 refills | Status: DC | PRN

## 2018-01-08 MED ORDER — OXYCODONE-ACETAMINOPHEN 5-325 MG PO TABS: 1.0000 | ORAL_TABLET | ORAL | 0 refills | Status: DC | PRN

## 2018-01-08 MED ORDER — NAPROXEN 500 MG PO TABS: 500.0000 mg | ORAL_TABLET | Freq: Two times a day (BID) | ORAL | 0 refills | Status: DC

## 2018-01-08 MED ORDER — LORAZEPAM 2 MG/ML IJ SOLN
1.0000 mg | Freq: Once | INTRAMUSCULAR | Status: AC
  Administered 2018-01-08: 1 mg via INTRAVENOUS
  Filled 2018-01-08: qty 1

## 2018-01-08 MED ORDER — GADOBENATE DIMEGLUMINE 529 MG/ML IV SOLN
20.0000 mL | Freq: Once | INTRAVENOUS | Status: AC
  Administered 2018-01-08: 20 mL via INTRAVENOUS

## 2018-01-08 NOTE — ED Notes (Signed)
Family back to ED requesting new prescription because pt is allergic to oxycodone. Provided new RX, Dr. Sherry Ruffing spoke with family in lobby.

## 2018-01-08 NOTE — ED Provider Notes (Signed)
[facility] EMERGENCY DEPARTMENT Provider Note   CSN: [id number] Arrival date & time: 01/08/18  0220     History   Chief Complaint Chief Complaint  Patient presents with  . Weakness    HPI Kirk [name] is a 72 y.o. male.  The history is provided by the patient, the spouse and medical records.  Back Pain   This is a new problem. The current episode started more than 1 week ago. The problem occurs constantly. The problem has been gradually worsening. The pain is associated with no known injury. The pain is present in the thoracic spine and lumbar spine. The quality of the pain is described as shooting, stabbing and aching. The pain radiates to the right thigh and left thigh. The pain is at a severity of 10/10. The pain is severe. The symptoms are aggravated by certain positions (stannding). The pain is the same all the time. Associated symptoms include weakness. Pertinent negatives include no chest pain, no fever, no numbness (at baseline), no headaches, no abdominal pain, no abdominal swelling, no bowel incontinence, no perianal numbness, no dysuria, no paresthesias and no paresis. He has tried nothing for the symptoms. Risk factors include a history of cancer.    Past Medical History:  Diagnosis Date  . Allergy   . Angina    6 wks. ago  . Aortic stenosis   . Arthritis    1995- cerv. fusion- [facility]  . Blood in stool   . 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 CHF (congestive heart failure) ([place])   . Coronary artery disease    aortic stenosis, CAD  . Diabetes mellitus    Type 2  . ED (erectile dysfunction)   . ED (erectile dysfunction)   . Elevated cholesterol   . Heart murmur   . History of radiation therapy 05/20/12-06/28/12   anal cancer=54gy total dose  . Kidney stones    hx  . Myocardial infarction ([place]) 2012  . Rectal bleeding   . Seizure disorder, grand mal ([place])   . Seizures ([place])    after taking Oxycodone; not  prescribed seizure med  . Shortness of breath   . Thrombosed hemorrhoids     Patient Active Problem List   Diagnosis Date Noted  . Seizures ([place]) 10/13/2013  . Obesity 08/11/2013  . 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  . Malignant neoplasm of anal canal ([place]) 05/12/2012  . Malignant neoplasm of other sites of rectum, rectosigmoid junction, and anus 04/12/2012  . Cancer ([place]) 04/09/2012  . Rectal mass 03/20/2012  . Personal history of colonic polyps 03/20/2012  . Atrial fibrillation ([place]) 08/20/2011  . 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  . 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  . Coronary artery disease 08/07/2011  . Aortic stenosis 07/31/2011  . Elevated cholesterol   . ED (erectile dysfunction)   . Kidney stones     Past Surgical History:  Procedure Laterality Date  . ANAL FISSURE REPAIR     patient unsure of date  . AORTIC VALVE REPLACEMENT  08/11/2011   Procedure: AORTIC VALVE REPLACEMENT (AVR);  Surgeon: Rexene Alberts, MD;  Location: [facility];  Service: Open Heart Surgery;  Laterality: N/A;  . [place]  . COLONOSCOPY    . CORONARY ARTERY BYPASS GRAFT  08/16/2011   Procedure: CORONARY ARTERY BYPASS GRAFTING (CABG);  Surgeon: Rexene Alberts, MD;  Location: [facility];  Service: Open Heart Surgery;  Laterality: N/A;  CABG times one using left internal mammary artery  . LITHOTRIPSY  2001 AND 2002   DR PETERSON        Home Medications    Prior to Admission medications   Medication Sig Start Date End Date Taking? Authorizing Provider  amitriptyline (ELAVIL) 25 MG tablet Take 25 mg by mouth at bedtime.     [provider]  aspirin EC 81 MG tablet Take 81 mg by mouth daily.    [provider]  Cholecalciferol 1000 UNITS tablet 2,000 Units. Take 185 mg by mouth daily.    [provider]  DULoxetine (CYMBALTA) 60 MG capsule Take 60 mg by mouth daily. 01/06/16   [provider]    LIPITOR 80 MG tablet TAKE 1 TABLET BY MOUTH DAILY 05/11/17   Jerline Pain, MD  losartan (COZAAR) 50 MG tablet Take 1 tablet (50 mg total) by mouth daily. 04/29/15   Jerline Pain, MD  metoprolol tartrate (LOPRESSOR) 25 MG tablet TAKE 1 TABLET BY MOUTH TWICE A DAY 04/29/15   Jerline Pain, MD  saxagliptin HCl (ONGLYZA) 5 MG TABS tablet Take 5 mg by mouth daily.    [provider]  Vitamins-Lipotropics (LIPO-FLAVONOID PLUS) TABS Take 1 tablet by mouth 3 (three) times daily after meals.      [provider]    Family History Family History  Problem Relation Age of Onset  . Cancer Mother        breast  . Heart attack Father   . Anesthesia problems Neg Hx   . Hypotension Neg Hx   . Malignant hyperthermia Neg Hx   . Pseudochol deficiency Neg Hx   . Colon cancer Neg Hx     Social History Social History   Tobacco Use  . Smoking status: Never Smoker  . Smokeless tobacco: Never Used  Substance Use Topics  . Alcohol use: Yes    Alcohol/week: 0.0 oz    Comment: RARE  . Drug use: No     Allergies   Cat hair extract; Atorvastatin; Metformin; and Oxycodone   Review of Systems Review of Systems  Constitutional: Negative for activity change, chills, diaphoresis, fatigue and fever.  HENT: Negative for congestion and rhinorrhea.   Eyes: Negative for visual disturbance.  Respiratory: Negative for cough, chest tightness, shortness of breath, wheezing and stridor.   Cardiovascular: Negative for chest pain, palpitations and leg swelling.  Gastrointestinal: Negative for abdominal distention, abdominal pain, blood in stool, bowel incontinence, constipation, diarrhea, nausea and vomiting.  Genitourinary: Negative for difficulty urinating, dysuria, flank pain and frequency.  Musculoskeletal: Positive for back pain. Negative for gait problem, neck pain and neck stiffness.  Skin: Negative for rash and wound.  Neurological: Positive for weakness. Negative for dizziness,  seizures, speech difficulty, light-headedness, numbness (at baseline), headaches and paresthesias.  Psychiatric/Behavioral: Negative for agitation and confusion.  All other systems reviewed and are negative.    Physical Exam Updated Vital Signs BP (!) 160/65   Pulse 63   Temp 98.4 F (36.9 C) (Oral)   Resp 17   Ht 5\' 11"  (1.803 m)   Wt 98.9 kg (218 lb)   SpO2 97%   BMI 30.40 kg/m   Physical Exam  Constitutional: He is oriented to person, place, and time. He appears well-developed and well-nourished. No distress.  HENT:  Head: Normocephalic and atraumatic.  Mouth/Throat: Oropharynx is clear and moist. No oropharyngeal exudate.  Eyes: Pupils are equal, round,  and reactive to light. Conjunctivae and EOM are normal.  Neck: Normal range of motion. Neck supple.  Cardiovascular: Normal rate and regular rhythm.  No murmur heard. Pulmonary/Chest: Effort normal and breath sounds normal. No respiratory distress.  Abdominal: Soft. There is no tenderness. There is no guarding.  Musculoskeletal: He exhibits no edema or tenderness.  Neurological: He is alert and oriented to person, place, and time. He is not disoriented. No cranial nerve deficit or sensory deficit. He exhibits normal muscle tone. Gait abnormal. GCS eye subscore is 4. GCS verbal subscore is 5. GCS motor subscore is 6.  No numbness or weakness on my initial exam.  When patient stood his legs buckled and he had to sit down due to reported weakness.  Patient was unable to walk due to back pain and leg weakness bilaterally.  Skin: Skin is warm and dry. Capillary refill takes less than 2 seconds. He is not diaphoretic. No erythema. No pallor.  Psychiatric: He has a normal mood and affect.  Nursing note and vitals reviewed.    ED Treatments / Results  Labs (all labs ordered are listed, but only abnormal results are displayed) Labs Reviewed  URINALYSIS, ROUTINE W REFLEX MICROSCOPIC - Abnormal; Notable for the following  components:      Result Value   Color, Urine STRAW (*)    All other components within normal limits  URINE CULTURE  CBC WITH DIFFERENTIAL/PLATELET  COMPREHENSIVE METABOLIC PANEL    EKG None  Radiology Mr Thoracic Spine W Wo Contrast  Result Date: 01/08/2018 CLINICAL DATA:  History of metastatic squamous cell carcinoma. Back pain with lower extremity weakness. EXAM: MRI THORACIC AND LUMBAR SPINE WITHOUT AND WITH CONTRAST TECHNIQUE: Multiplanar and multiecho pulse sequences of the thoracic and lumbar spine were obtained without and with intravenous contrast. CONTRAST:  50mL MULTIHANCE GADOBENATE DIMEGLUMINE 529 MG/ML IV SOLN COMPARISON:  Chest CT 10/09/2013 FINDINGS: MRI THORACIC SPINE FINDINGS Alignment:  Physiologic. Vertebrae: No fracture, evidence of discitis, or bone lesion. Cord:  Normal signal and morphology. Paraspinal and other soft tissues: Negative. Disc levels: Mild multilevel disc degeneration, not unexpected for age. No spinal canal stenosis. MRI LUMBAR SPINE FINDINGS Segmentation:  Standard. Alignment:  Grade 1 L4-L5 and L5-S1 anterolisthesis. Vertebrae:  No fracture, evidence of discitis, or bone lesion. Conus medullaris: Extends to the L1 level and appears normal. Paraspinal and other soft tissues: Negative. Disc levels: L1-L2: There is a synovial cyst arising from the medial aspect of the right facet joint, measuring 10 x 6 x 8 mm. This displaces the right-sided nerve roots. The central spinal canal remains patent. There is moderate right neural foraminal stenosis. L2-L3: No stenosis. L3-L4: Moderate facet hypertrophy and medium-sized disc bulge. There is a small medially projecting synovial cyst that measures approximately 6 x 5 mm. This is visible only on the sagittal sequence and is near the midline. There is moderate central spinal canal and bilateral neural foraminal stenosis. L4-L5: Grade 1 anterolisthesis. There is severe bilateral facet hypertrophy with fluid in both facet  joints. There is severe bilateral neural foraminal stenosis and mild central spinal canal stenosis. Diffuse disc bulge. L5-S1: Grade 1 anterolisthesis secondary to severe facet hypertrophy. No spinal canal stenosis. Severe bilateral neural foraminal stenosis. IMPRESSION: 1. No osseous metastatic disease of the thoracic or lumbar spine. 2. Grade 1 anterolisthesis at L4-L5 and L5-S1 secondary to severe bilateral facet arthrosis. 3. Medially projecting synovial cysts at the L1-2 and L3-4 levels that may contribute to irritation of the descending nerve roots.  4. Severe bilateral L4-5 and L5-S1 neural foraminal stenosis. Electronically Signed   By: Ulyses Jarred M.D.   On: 01/08/2018 17:36   Mr Lumbar Spine W Wo Contrast  Result Date: 01/08/2018 CLINICAL DATA:  History of metastatic squamous cell carcinoma. Back pain with lower extremity weakness. EXAM: MRI THORACIC AND LUMBAR SPINE WITHOUT AND WITH CONTRAST TECHNIQUE: Multiplanar and multiecho pulse sequences of the thoracic and lumbar spine were obtained without and with intravenous contrast. CONTRAST:  49mL MULTIHANCE GADOBENATE DIMEGLUMINE 529 MG/ML IV SOLN COMPARISON:  Chest CT 10/09/2013 FINDINGS: MRI THORACIC SPINE FINDINGS Alignment:  Physiologic. Vertebrae: No fracture, evidence of discitis, or bone lesion. Cord:  Normal signal and morphology. Paraspinal and other soft tissues: Negative. Disc levels: Mild multilevel disc degeneration, not unexpected for age. No spinal canal stenosis. MRI LUMBAR SPINE FINDINGS Segmentation:  Standard. Alignment:  Grade 1 L4-L5 and L5-S1 anterolisthesis. Vertebrae:  No fracture, evidence of discitis, or bone lesion. Conus medullaris: Extends to the L1 level and appears normal. Paraspinal and other soft tissues: Negative. Disc levels: L1-L2: There is a synovial cyst arising from the medial aspect of the right facet joint, measuring 10 x 6 x 8 mm. This displaces the right-sided nerve roots. The central spinal canal remains  patent. There is moderate right neural foraminal stenosis. L2-L3: No stenosis. L3-L4: Moderate facet hypertrophy and medium-sized disc bulge. There is a small medially projecting synovial cyst that measures approximately 6 x 5 mm. This is visible only on the sagittal sequence and is near the midline. There is moderate central spinal canal and bilateral neural foraminal stenosis. L4-L5: Grade 1 anterolisthesis. There is severe bilateral facet hypertrophy with fluid in both facet joints. There is severe bilateral neural foraminal stenosis and mild central spinal canal stenosis. Diffuse disc bulge. L5-S1: Grade 1 anterolisthesis secondary to severe facet hypertrophy. No spinal canal stenosis. Severe bilateral neural foraminal stenosis. IMPRESSION: 1. No osseous metastatic disease of the thoracic or lumbar spine. 2. Grade 1 anterolisthesis at L4-L5 and L5-S1 secondary to severe bilateral facet arthrosis. 3. Medially projecting synovial cysts at the L1-2 and L3-4 levels that may contribute to irritation of the descending nerve roots. 4. Severe bilateral L4-5 and L5-S1 neural foraminal stenosis. Electronically Signed   By: Ulyses Jarred M.D.   On: 01/08/2018 17:36    Procedures Procedures (including critical care time)  Medications Ordered in ED Medications  LORazepam (ATIVAN) injection 1 mg (1 mg Intravenous Given 01/08/18 1535)  gadobenate dimeglumine (MULTIHANCE) injection 20 mL (20 mLs Intravenous Contrast Given 01/08/18 1658)     Initial Impression / Assessment and Plan / ED Course  I have reviewed the triage vital signs and the nursing notes.  Pertinent labs & imaging results that were available during my care of the patient were reviewed by me and considered in my medical decision making (see chart for details).     Kirk [name] is a 72 y.o. male with a past medical history significant for CAD status post MI and CABG, hypercholesterolemia, aortic stenosis status post aortic valve replacement, CHF,  diabetes and prior rectal cancer status post chemo and radiation who presents with severe back pain and bilateral leg weakness.  Patient reports that over the last few weeks he has had severe back pain in his mid and low back.  He denies any trauma but he does work as a Aeronautical engineer heavy things.  He denies any sudden pop or trauma.  He reports his pain is up to a 10  out of 10 in severity.  He reports it radiates down both of his legs.  Is occasionally electric and shooting.  He says that it is constant but waxes and wanes depending on if he is standing or not.  When he lays down it is minimal.  He denies any incontinence.  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  He does report that when he stands both his legs get weak and they buckle.  He reports that he had to crawl at work several days ago and has not been able to walk or stand well since.  He denies any fevers, chills, chest pain, palpitations, shortness of breath, nausea, vomiting, or abdominal pain.  He denies any urinary symptoms.  He denies any constipation or diarrhea.  He denies other complaints.  He reports that he had a tick on him several weeks ago but saw his PCP and had negative Lyme work-up.   On exam, patient had no significant back tenderness on my palpation however when patient stood he had extremely severe back pain.  Patient was able to stand without his legs getting weak and he had to sit down.  When patient was laying flat he had normal strength and sensation in his legs in regards to plantar flexion dorsiflexion and hip flexion.  Patient's exam was otherwise unremarkable.  Based on patient's history of rectal cancer and the concerning worsening back pain with leg weakness, patient will have MRIs to look for pathologic fracture or malignancy in his spine.  Patient will also have laboratory testing to look for other concerning etiologies of back pain including kidney stone or  infection.  When patient is lying he reports he does not need pain medication.    Anticipate reassessment after imaging.  7:06 PM MRI results showed no evidence of acute fracture or cancer.  No evidence of cord injury.  Patient was reassessed and after his pain was under better control he was able to stand and take several steps.  The bilateral leg weakness appears to have improved.  Suspect this was pain related.  Suspect patient has a muscular back pain in the setting of his degenerative disease.    Patient will be given prescription for pain medicine and muscle relaxant and instructed to follow-up with PCP and his spine doctor.  Patient and family agree with plan of care and understood return precautions.  Patient had no other questions or concerns and was discharged in good condition.  Initially, patient was given prescription for oxycodone however family returned to report that patient is allergic to this.  This was confirmed in his chart.  Instead, patient will be given prescription for naproxen.  Patient will continue follow-up plans as previously instructed.   Final Clinical Impressions(s) / ED Diagnoses   Final diagnoses:  Acute low back pain with bilateral sciatica, unspecified back pain laterality    ED Discharge Orders        Ordered    oxyCODONE-acetaminophen (PERCOCET/ROXICET) 5-325 MG tablet  Every 4 hours PRN     01/08/18 1905    cyclobenzaprine (FLEXERIL) 10 MG tablet  2 times daily PRN     01/08/18 1905    naproxen (NAPROSYN) 500 MG tablet  2 times daily     01/08/18 2207      Clinical Impression: 1. Acute low back pain with bilateral sciatica, unspecified back pain laterality     Disposition: Discharge  Condition: Good  I have discussed  the results, Dx and Tx plan with the pt(& family if present). He/she/they expressed understanding and agree(s) with the plan. Discharge instructions discussed at great length. Strict return precautions discussed and pt &/or  family have verbalized understanding of the instructions. No further questions at time of discharge.    Discharge Medication List as of 01/08/2018  7:06 PM    START taking these medications   Details  cyclobenzaprine (FLEXERIL) 10 MG tablet Take 1 tablet (10 mg total) by mouth 2 (two) times daily as needed for muscle spasms., Starting Tue 01/08/2018, Print    oxyCODONE-acetaminophen (PERCOCET/ROXICET) 5-325 MG tablet Take 1 tablet by mouth every 4 (four) hours as needed for severe pain., Starting Tue 01/08/2018, Print        Follow Up: Maury Dus, [place] [address] [place] [address] [facility] [address] [id number] mc [place]       Tegeler, Gwenyth Allegra, MD 01/08/18 [phone]

## 2018-01-08 NOTE — ED Notes (Signed)
Patient transported to MRI 

## 2018-01-08 NOTE — Discharge Instructions (Signed)
Your imaging today showed evidence of degenerative disease and arthritis but no evidence of new fracture or cancer.  Please follow-up with your primary doctor and possibly a spine doctor if your symptoms persist.  Please use the medicines to help with your symptoms and rest your back.  Please stay hydrated.  If any symptoms change or worsen, please return to the nearest emergency department.

## 2018-01-08 NOTE — ED Triage Notes (Signed)
Pt presents with bilat leg weakness x 2 days with no explained injury; pt states his pain is intermittent and moves around to different areas of his body x 8 weeks ago; pt denies any focal neuro symptoms; each time he has a flare of this pain/weakness its bilateral; pt seen by PCP and labs showed inflammation?

## 2018-01-09 LAB — URINE CULTURE: CULTURE: NO GROWTH

## 2018-01-14 DIAGNOSIS — M545 Low back pain: Secondary | ICD-10-CM | POA: Diagnosis not present

## 2018-01-16 ENCOUNTER — Encounter: Payer: Self-pay | Admitting: Radiation Oncology

## 2018-01-16 DIAGNOSIS — M48061 Spinal stenosis, lumbar region without neurogenic claudication: Secondary | ICD-10-CM | POA: Diagnosis not present

## 2018-01-16 DIAGNOSIS — M545 Low back pain: Secondary | ICD-10-CM | POA: Diagnosis not present

## 2018-01-17 DIAGNOSIS — M4802 Spinal stenosis, cervical region: Secondary | ICD-10-CM | POA: Diagnosis not present

## 2018-01-17 DIAGNOSIS — M4316 Spondylolisthesis, lumbar region: Secondary | ICD-10-CM | POA: Diagnosis not present

## 2018-01-17 DIAGNOSIS — M7138 Other bursal cyst, other site: Secondary | ICD-10-CM | POA: Diagnosis not present

## 2018-01-17 DIAGNOSIS — M48061 Spinal stenosis, lumbar region without neurogenic claudication: Secondary | ICD-10-CM | POA: Diagnosis not present

## 2018-01-17 DIAGNOSIS — M5412 Radiculopathy, cervical region: Secondary | ICD-10-CM | POA: Diagnosis not present

## 2018-01-17 DIAGNOSIS — G992 Myelopathy in diseases classified elsewhere: Secondary | ICD-10-CM | POA: Diagnosis not present

## 2018-01-17 DIAGNOSIS — M50323 Other cervical disc degeneration at C6-C7 level: Secondary | ICD-10-CM | POA: Diagnosis not present

## 2018-01-17 DIAGNOSIS — M5416 Radiculopathy, lumbar region: Secondary | ICD-10-CM | POA: Diagnosis not present

## 2018-01-17 DIAGNOSIS — Z6832 Body mass index (BMI) 32.0-32.9, adult: Secondary | ICD-10-CM | POA: Diagnosis not present

## 2018-01-18 ENCOUNTER — Other Ambulatory Visit: Payer: Self-pay | Admitting: Neurosurgery

## 2018-01-18 ENCOUNTER — Telehealth: Payer: Self-pay | Admitting: Cardiology

## 2018-01-18 ENCOUNTER — Telehealth: Payer: Self-pay | Admitting: *Deleted

## 2018-01-18 DIAGNOSIS — M5412 Radiculopathy, cervical region: Secondary | ICD-10-CM | POA: Diagnosis not present

## 2018-01-18 DIAGNOSIS — M4316 Spondylolisthesis, lumbar region: Secondary | ICD-10-CM | POA: Diagnosis not present

## 2018-01-18 DIAGNOSIS — G992 Myelopathy in diseases classified elsewhere: Secondary | ICD-10-CM | POA: Diagnosis not present

## 2018-01-18 DIAGNOSIS — M5416 Radiculopathy, lumbar region: Secondary | ICD-10-CM | POA: Diagnosis not present

## 2018-01-18 DIAGNOSIS — M7138 Other bursal cyst, other site: Secondary | ICD-10-CM | POA: Diagnosis not present

## 2018-01-18 DIAGNOSIS — M48061 Spinal stenosis, lumbar region without neurogenic claudication: Secondary | ICD-10-CM | POA: Diagnosis not present

## 2018-01-18 DIAGNOSIS — M4802 Spinal stenosis, cervical region: Secondary | ICD-10-CM | POA: Diagnosis not present

## 2018-01-18 NOTE — Telephone Encounter (Signed)
New message    Pt wife came in because she wants to make sure that surgical clearance for the pt was received. She said he is to have surgery on Tuesday for his back. Pt wife is waiting in lobby

## 2018-01-18 NOTE — Telephone Encounter (Signed)
   [facility] Pre-operative Risk Assessment    Request for surgical clearance:  1. What type of surgery is being performed? Cervical Fusion  2. When is this surgery scheduled? 01/22/18  3. What type of clearance is required (medical clearance vs. Pharmacy clearance to hold med vs. Both)? medical  4. Are there any medications that need to be held prior to surgery and how long? None requested  5. Practice name and name of physician performing surgery? [facility], Dr Erline Levine  6. What is your office phone number [phone] ext [phone]    7.   What is your office fax number [phone] Attn: Nikki  8.   Anesthesia type (None, local, MAC, general) ? Not listed   Kirk [name] 01/18/2018, 5:12 PM  _________________________________________________________________   (provider comments below)

## 2018-01-18 NOTE — Telephone Encounter (Signed)
Received surgical clearance from Dr Melven Sartorius office for cervical fusion scheduled for 5/21 (Tuesday).  No type of anesthesia listed.  Wife states pt has to have this surgery on Tuesday.  Advised wife I will review this with Dr Marlou Porch however pt has not been seen since 04/2017 and may need testing prior to MD being able to give cardiac clearance.  Also advised pt has the right to have the surgery without cardiac clearance however that is a risk he will have to decide if it is worth it or not.  Wife states understanding and is aware someone will be in contact with her.

## 2018-01-21 ENCOUNTER — Other Ambulatory Visit: Payer: Self-pay

## 2018-01-21 ENCOUNTER — Encounter (HOSPITAL_COMMUNITY): Payer: Self-pay | Admitting: *Deleted

## 2018-01-21 NOTE — Telephone Encounter (Signed)
Per Dr. Marlou Porch review  Patient has been given medical clearance for upcoming planned surgery at a moderate risk based on his history of CABG and aortic valve replacement.  He has been stable from a cardiac perspective since last seen August 2018.

## 2018-01-21 NOTE — Telephone Encounter (Signed)
   Primary Cardiologist:Mark Marlou Porch, MD  Chart reviewed as part of pre-operative protocol coverage. Pre-op clearance already addressed by colleagues in earlier phone notes. To summarize recommendations:  Per Dr. Marlou Porch: Patient has been given medical clearance for upcoming planned surgery at a moderate risk based on his history of CABG and aortic valve replacement.  He has been stable from a cardiac perspective since last seen August 2018.  Will route this bundled recommendation to requesting provider via Epic fax function. Please call with questions.  Daune Perch, NP 01/21/2018, 2:47 PM

## 2018-01-22 ENCOUNTER — Ambulatory Visit (HOSPITAL_COMMUNITY): Payer: Medicare Other

## 2018-01-22 ENCOUNTER — Ambulatory Visit (HOSPITAL_COMMUNITY): Payer: Medicare Other | Admitting: Certified Registered Nurse Anesthetist

## 2018-01-22 ENCOUNTER — Encounter (HOSPITAL_COMMUNITY): Payer: Self-pay | Admitting: Orthopedic Surgery

## 2018-01-22 ENCOUNTER — Observation Stay (HOSPITAL_COMMUNITY)
Admission: RE | Admit: 2018-01-22 | Discharge: 2018-01-23 | Disposition: A | Discharge status: Home / Self Care | Payer: Medicare Other | Source: Ambulatory Visit | Attending: Neurosurgery | Admitting: Neurosurgery

## 2018-01-22 ENCOUNTER — Encounter (HOSPITAL_COMMUNITY)
Admission: RE | Disposition: A | Discharge status: Home / Self Care | Payer: Self-pay | Source: Ambulatory Visit | Attending: Neurosurgery

## 2018-01-22 DIAGNOSIS — G959 Disease of spinal cord, unspecified: Secondary | ICD-10-CM | POA: Diagnosis present

## 2018-01-22 DIAGNOSIS — E785 Hyperlipidemia, unspecified: Secondary | ICD-10-CM | POA: Insufficient documentation

## 2018-01-22 DIAGNOSIS — M50023 Cervical disc disorder at C6-C7 level with myelopathy: Secondary | ICD-10-CM | POA: Diagnosis not present

## 2018-01-22 DIAGNOSIS — I2510 Atherosclerotic heart disease of native coronary artery without angina pectoris: Secondary | ICD-10-CM | POA: Insufficient documentation

## 2018-01-22 DIAGNOSIS — Z79899 Other long term (current) drug therapy: Secondary | ICD-10-CM | POA: Insufficient documentation

## 2018-01-22 DIAGNOSIS — E119 Type 2 diabetes mellitus without complications: Secondary | ICD-10-CM | POA: Insufficient documentation

## 2018-01-22 DIAGNOSIS — Z951 Presence of aortocoronary bypass graft: Secondary | ICD-10-CM | POA: Diagnosis not present

## 2018-01-22 DIAGNOSIS — I509 Heart failure, unspecified: Secondary | ICD-10-CM | POA: Diagnosis not present

## 2018-01-22 DIAGNOSIS — I4891 Unspecified atrial fibrillation: Secondary | ICD-10-CM | POA: Diagnosis not present

## 2018-01-22 DIAGNOSIS — Z7982 Long term (current) use of aspirin: Secondary | ICD-10-CM | POA: Insufficient documentation

## 2018-01-22 DIAGNOSIS — Z419 Encounter for procedure for purposes other than remedying health state, unspecified: Secondary | ICD-10-CM

## 2018-01-22 DIAGNOSIS — I252 Old myocardial infarction: Secondary | ICD-10-CM | POA: Diagnosis not present

## 2018-01-22 DIAGNOSIS — M4802 Spinal stenosis, cervical region: Secondary | ICD-10-CM | POA: Diagnosis not present

## 2018-01-22 DIAGNOSIS — M4322 Fusion of spine, cervical region: Secondary | ICD-10-CM | POA: Diagnosis not present

## 2018-01-22 HISTORY — DX: Personal history of urinary calculi: Z87.442

## 2018-01-22 HISTORY — PX: ANTERIOR CERVICAL DECOMP/DISCECTOMY FUSION: SHX1161

## 2018-01-22 LAB — TYPE AND SCREEN
ABO/RH(D): A POS
ANTIBODY SCREEN: NEGATIVE

## 2018-01-22 LAB — GLUCOSE, CAPILLARY
GLUCOSE-CAPILLARY: 103 mg/dL — AB (ref 65–99)
GLUCOSE-CAPILLARY: 87 mg/dL (ref 65–99)
Glucose-Capillary: 190 mg/dL — ABNORMAL HIGH (ref 65–99)

## 2018-01-22 LAB — HEMOGLOBIN A1C
Hgb A1c MFr Bld: 6 % — ABNORMAL HIGH (ref 4.8–5.6)
Mean Plasma Glucose: 125.5 mg/dL

## 2018-01-22 SURGERY — ANTERIOR CERVICAL DECOMPRESSION/DISCECTOMY FUSION 1 LEVEL
Anesthesia: General

## 2018-01-22 MED ORDER — SUCCINYLCHOLINE CHLORIDE 200 MG/10ML IV SOSY
PREFILLED_SYRINGE | INTRAVENOUS | Status: AC
  Filled 2018-01-22: qty 10

## 2018-01-22 MED ORDER — DEXAMETHASONE SODIUM PHOSPHATE 10 MG/ML IJ SOLN
INTRAMUSCULAR | Status: AC
  Filled 2018-01-22: qty 1

## 2018-01-22 MED ORDER — ZOLPIDEM TARTRATE 5 MG PO TABS: 5.0000 mg | ORAL_TABLET | Freq: Every evening | ORAL | Status: DC | PRN

## 2018-01-22 MED ORDER — BUPIVACAINE HCL (PF) 0.5 % IJ SOLN
INTRAMUSCULAR | Status: DC | PRN
  Administered 2018-01-22: 7 mL

## 2018-01-22 MED ORDER — CHLORHEXIDINE GLUCONATE CLOTH 2 % EX PADS: 6.0000 | MEDICATED_PAD | Freq: Once | CUTANEOUS | Status: DC

## 2018-01-22 MED ORDER — 0.9 % SODIUM CHLORIDE (POUR BTL) OPTIME
TOPICAL | Status: DC | PRN
  Administered 2018-01-22: 1000 mL

## 2018-01-22 MED ORDER — SUGAMMADEX SODIUM 200 MG/2ML IV SOLN
INTRAVENOUS | Status: DC | PRN
  Administered 2018-01-22: 200 mg via INTRAVENOUS

## 2018-01-22 MED ORDER — ONDANSETRON HCL 4 MG/2ML IJ SOLN: 4.0000 mg | Freq: Four times a day (QID) | INTRAMUSCULAR | Status: DC | PRN

## 2018-01-22 MED ORDER — PHENYLEPHRINE 40 MCG/ML (10ML) SYRINGE FOR IV PUSH (FOR BLOOD PRESSURE SUPPORT)
PREFILLED_SYRINGE | INTRAVENOUS | Status: DC | PRN
  Administered 2018-01-22: 240 ug via INTRAVENOUS
  Administered 2018-01-22: 160 ug via INTRAVENOUS

## 2018-01-22 MED ORDER — LIPO-FLAVONOID PLUS PO TABS: 1.0000 | ORAL_TABLET | Freq: Three times a day (TID) | ORAL | Status: DC

## 2018-01-22 MED ORDER — ACETAMINOPHEN 500 MG PO TABS: 1000.0000 mg | ORAL_TABLET | Freq: Four times a day (QID) | ORAL | Status: DC | PRN

## 2018-01-22 MED ORDER — LIDOCAINE 2% (20 MG/ML) 5 ML SYRINGE
INTRAMUSCULAR | Status: AC
  Filled 2018-01-22: qty 5

## 2018-01-22 MED ORDER — SODIUM CHLORIDE 0.9% FLUSH: 3.0000 mL | INTRAVENOUS | Status: DC | PRN

## 2018-01-22 MED ORDER — METHOCARBAMOL 1000 MG/10ML IJ SOLN
500.0000 mg | Freq: Four times a day (QID) | INTRAMUSCULAR | Status: DC | PRN
  Filled 2018-01-22: qty 5

## 2018-01-22 MED ORDER — PROPOFOL 10 MG/ML IV BOLUS
INTRAVENOUS | Status: AC
  Filled 2018-01-22: qty 40

## 2018-01-22 MED ORDER — FENTANYL CITRATE (PF) 250 MCG/5ML IJ SOLN
INTRAMUSCULAR | Status: AC
  Filled 2018-01-22: qty 5

## 2018-01-22 MED ORDER — LIDOCAINE-EPINEPHRINE 1 %-1:100000 IJ SOLN
INTRAMUSCULAR | Status: DC | PRN
  Administered 2018-01-22: 7 mL

## 2018-01-22 MED ORDER — ROCURONIUM BROMIDE 50 MG/5ML IV SOLN
INTRAVENOUS | Status: AC
  Filled 2018-01-22: qty 1

## 2018-01-22 MED ORDER — METHOCARBAMOL 500 MG PO TABS: 500.0000 mg | ORAL_TABLET | Freq: Four times a day (QID) | ORAL | Status: DC | PRN

## 2018-01-22 MED ORDER — BUPIVACAINE HCL (PF) 0.5 % IJ SOLN
INTRAMUSCULAR | Status: AC
  Filled 2018-01-22: qty 30

## 2018-01-22 MED ORDER — FLEET ENEMA 7-19 GM/118ML RE ENEM: 1.0000 | ENEMA | Freq: Once | RECTAL | Status: DC | PRN

## 2018-01-22 MED ORDER — CEFAZOLIN SODIUM-DEXTROSE 2-4 GM/100ML-% IV SOLN
2.0000 g | INTRAVENOUS | Status: AC
  Administered 2018-01-22: 2 g via INTRAVENOUS
  Filled 2018-01-22: qty 100

## 2018-01-22 MED ORDER — THROMBIN 5000 UNITS EX SOLR
CUTANEOUS | Status: DC | PRN
  Administered 2018-01-22: 10000 [IU] via TOPICAL

## 2018-01-22 MED ORDER — VITAMIN D 1000 UNITS PO TABS
2000.0000 [IU] | ORAL_TABLET | Freq: Every morning | ORAL | Status: DC
  Filled 2018-01-22: qty 2

## 2018-01-22 MED ORDER — BISACODYL 10 MG RE SUPP: 10.0000 mg | Freq: Every day | RECTAL | Status: DC | PRN

## 2018-01-22 MED ORDER — ALUM & MAG HYDROXIDE-SIMETH 200-200-20 MG/5ML PO SUSP: 30.0000 mL | Freq: Four times a day (QID) | ORAL | Status: DC | PRN

## 2018-01-22 MED ORDER — LIDOCAINE-EPINEPHRINE 1 %-1:100000 IJ SOLN
INTRAMUSCULAR | Status: AC
  Filled 2018-01-22: qty 1

## 2018-01-22 MED ORDER — INSULIN ASPART 100 UNIT/ML ~~LOC~~ SOLN: 0.0000 [IU] | Freq: Three times a day (TID) | SUBCUTANEOUS | Status: DC

## 2018-01-22 MED ORDER — ROCURONIUM BROMIDE 100 MG/10ML IV SOLN
INTRAVENOUS | Status: DC | PRN
  Administered 2018-01-22: 50 mg via INTRAVENOUS

## 2018-01-22 MED ORDER — DEXAMETHASONE SODIUM PHOSPHATE 10 MG/ML IJ SOLN
INTRAMUSCULAR | Status: DC | PRN
  Administered 2018-01-22: 10 mg via INTRAVENOUS

## 2018-01-22 MED ORDER — HEMOSTATIC AGENTS (NO CHARGE) OPTIME
TOPICAL | Status: DC | PRN
  Administered 2018-01-22: 1 via TOPICAL

## 2018-01-22 MED ORDER — INSULIN ASPART 100 UNIT/ML ~~LOC~~ SOLN: 0.0000 [IU] | Freq: Every day | SUBCUTANEOUS | Status: DC

## 2018-01-22 MED ORDER — SUCCINYLCHOLINE CHLORIDE 200 MG/10ML IV SOSY
PREFILLED_SYRINGE | INTRAVENOUS | Status: DC | PRN
  Administered 2018-01-22: 120 mg via INTRAVENOUS

## 2018-01-22 MED ORDER — CEFAZOLIN SODIUM-DEXTROSE 2-4 GM/100ML-% IV SOLN
2.0000 g | Freq: Three times a day (TID) | INTRAVENOUS | Status: AC
  Administered 2018-01-22 – 2018-01-23 (×2): 2 g via INTRAVENOUS
  Filled 2018-01-22 (×2): qty 100

## 2018-01-22 MED ORDER — TETRAHYDROZOLINE HCL 0.05 % OP SOLN
1.0000 [drp] | Freq: Two times a day (BID) | OPHTHALMIC | Status: DC | PRN
  Filled 2018-01-22: qty 15

## 2018-01-22 MED ORDER — KCL IN DEXTROSE-NACL 20-5-0.45 MEQ/L-%-% IV SOLN: INTRAVENOUS | Status: DC

## 2018-01-22 MED ORDER — THROMBIN 5000 UNITS EX SOLR
CUTANEOUS | Status: AC
  Filled 2018-01-22: qty 5000

## 2018-01-22 MED ORDER — ATORVASTATIN CALCIUM 20 MG PO TABS
80.0000 mg | ORAL_TABLET | Freq: Every day | ORAL | Status: DC
  Administered 2018-01-22: 80 mg via ORAL
  Filled 2018-01-22: qty 4

## 2018-01-22 MED ORDER — ONDANSETRON HCL 4 MG/2ML IJ SOLN
INTRAMUSCULAR | Status: AC
  Filled 2018-01-22: qty 2

## 2018-01-22 MED ORDER — FENTANYL CITRATE (PF) 100 MCG/2ML IJ SOLN
INTRAMUSCULAR | Status: DC | PRN
  Administered 2018-01-22: 50 ug via INTRAVENOUS
  Administered 2018-01-22: 100 ug via INTRAVENOUS

## 2018-01-22 MED ORDER — LIDOCAINE 2% (20 MG/ML) 5 ML SYRINGE
INTRAMUSCULAR | Status: DC | PRN
  Administered 2018-01-22: 100 mg via INTRAVENOUS

## 2018-01-22 MED ORDER — POLYETHYLENE GLYCOL 3350 17 G PO PACK: 17.0000 g | PACK | Freq: Every day | ORAL | Status: DC | PRN

## 2018-01-22 MED ORDER — ONDANSETRON HCL 4 MG PO TABS: 4.0000 mg | ORAL_TABLET | Freq: Four times a day (QID) | ORAL | Status: DC | PRN

## 2018-01-22 MED ORDER — DOCUSATE SODIUM 100 MG PO CAPS
100.0000 mg | ORAL_CAPSULE | Freq: Two times a day (BID) | ORAL | Status: DC
  Administered 2018-01-22: 100 mg via ORAL
  Filled 2018-01-22: qty 1

## 2018-01-22 MED ORDER — PROPOFOL 10 MG/ML IV BOLUS
INTRAVENOUS | Status: AC
  Filled 2018-01-22: qty 20

## 2018-01-22 MED ORDER — MORPHINE SULFATE (PF) 4 MG/ML IV SOLN: 2.0000 mg | INTRAVENOUS | Status: DC | PRN

## 2018-01-22 MED ORDER — HYDROCODONE-ACETAMINOPHEN 5-325 MG PO TABS
2.0000 | ORAL_TABLET | ORAL | Status: DC | PRN
  Administered 2018-01-22: 1 via ORAL
  Filled 2018-01-22: qty 2

## 2018-01-22 MED ORDER — HYDROCODONE-ACETAMINOPHEN 5-325 MG PO TABS: 1.0000 | ORAL_TABLET | ORAL | Status: DC | PRN

## 2018-01-22 MED ORDER — FAMOTIDINE IN NACL 20-0.9 MG/50ML-% IV SOLN: 20.0000 mg | Freq: Two times a day (BID) | INTRAVENOUS | Status: DC

## 2018-01-22 MED ORDER — ONDANSETRON HCL 4 MG/2ML IJ SOLN
INTRAMUSCULAR | Status: DC | PRN
  Administered 2018-01-22: 4 mg via INTRAVENOUS

## 2018-01-22 MED ORDER — PHENOL 1.4 % MT LIQD: 1.0000 | OROMUCOSAL | Status: DC | PRN

## 2018-01-22 MED ORDER — INSULIN ASPART 100 UNIT/ML ~~LOC~~ SOLN: 4.0000 [IU] | Freq: Three times a day (TID) | SUBCUTANEOUS | Status: DC

## 2018-01-22 MED ORDER — THROMBIN 5000 UNITS EX SOLR
CUTANEOUS | Status: AC
  Filled 2018-01-22: qty 10000

## 2018-01-22 MED ORDER — DULOXETINE HCL 30 MG PO CPEP
60.0000 mg | ORAL_CAPSULE | Freq: Every day | ORAL | Status: DC
  Administered 2018-01-22: 60 mg via ORAL
  Filled 2018-01-22: qty 2

## 2018-01-22 MED ORDER — ACETAMINOPHEN 325 MG PO TABS: 650.0000 mg | ORAL_TABLET | ORAL | Status: DC | PRN

## 2018-01-22 MED ORDER — METOPROLOL TARTRATE 25 MG PO TABS
25.0000 mg | ORAL_TABLET | Freq: Two times a day (BID) | ORAL | Status: DC
  Administered 2018-01-22: 25 mg via ORAL
  Filled 2018-01-22: qty 1

## 2018-01-22 MED ORDER — SODIUM CHLORIDE 0.9% FLUSH
3.0000 mL | Freq: Two times a day (BID) | INTRAVENOUS | Status: DC
  Administered 2018-01-22: 3 mL via INTRAVENOUS

## 2018-01-22 MED ORDER — PROPOFOL 10 MG/ML IV BOLUS
INTRAVENOUS | Status: DC | PRN
  Administered 2018-01-22: 200 mg via INTRAVENOUS

## 2018-01-22 MED ORDER — FAMOTIDINE 20 MG PO TABS
20.0000 mg | ORAL_TABLET | Freq: Two times a day (BID) | ORAL | Status: DC
  Administered 2018-01-22: 20 mg via ORAL
  Filled 2018-01-22: qty 1

## 2018-01-22 MED ORDER — LINAGLIPTIN 5 MG PO TABS
5.0000 mg | ORAL_TABLET | Freq: Every day | ORAL | Status: DC
  Filled 2018-01-22: qty 1

## 2018-01-22 MED ORDER — DEXTROSE 5 % IV SOLN
INTRAVENOUS | Status: DC | PRN
  Administered 2018-01-22: 25 ug/min via INTRAVENOUS

## 2018-01-22 MED ORDER — ACETAMINOPHEN 650 MG RE SUPP: 650.0000 mg | RECTAL | Status: DC | PRN

## 2018-01-22 MED ORDER — ASPIRIN EC 81 MG PO TBEC
81.0000 mg | DELAYED_RELEASE_TABLET | Freq: Every day | ORAL | Status: DC
  Administered 2018-01-22: 81 mg via ORAL
  Filled 2018-01-22: qty 1

## 2018-01-22 MED ORDER — ROCURONIUM BROMIDE 10 MG/ML (PF) SYRINGE
PREFILLED_SYRINGE | INTRAVENOUS | Status: AC
  Filled 2018-01-22: qty 5

## 2018-01-22 MED ORDER — MIDAZOLAM HCL 2 MG/2ML IJ SOLN
INTRAMUSCULAR | Status: AC
  Filled 2018-01-22: qty 2

## 2018-01-22 MED ORDER — MENTHOL 3 MG MT LOZG: 1.0000 | LOZENGE | OROMUCOSAL | Status: DC | PRN

## 2018-01-22 MED ORDER — AMITRIPTYLINE HCL 25 MG PO TABS
25.0000 mg | ORAL_TABLET | Freq: Every evening | ORAL | Status: DC | PRN
  Filled 2018-01-22: qty 1

## 2018-01-22 MED ORDER — CYCLOBENZAPRINE HCL 10 MG PO TABS: 10.0000 mg | ORAL_TABLET | Freq: Two times a day (BID) | ORAL | Status: DC | PRN

## 2018-01-22 MED ORDER — THROMBIN 20000 UNITS EX SOLR
CUTANEOUS | Status: AC
  Filled 2018-01-22: qty 40000

## 2018-01-22 MED ORDER — LACTATED RINGERS IV SOLN
INTRAVENOUS | Status: DC
  Administered 2018-01-22 (×3): via INTRAVENOUS

## 2018-01-22 MED ORDER — LOSARTAN POTASSIUM 50 MG PO TABS
50.0000 mg | ORAL_TABLET | Freq: Every day | ORAL | Status: DC
  Administered 2018-01-22: 50 mg via ORAL
  Filled 2018-01-22: qty 1

## 2018-01-22 MED ORDER — THROMBIN (RECOMBINANT) 5000 UNITS EX SOLR
OROMUCOSAL | Status: DC | PRN
  Administered 2018-01-22: 14:00:00 via TOPICAL

## 2018-01-22 SURGICAL SUPPLY — 67 items
ADH SKN CLS APL DERMABOND .7 (GAUZE/BANDAGES/DRESSINGS) ×1
BASKET BONE COLLECTION (BASKET) ×2 IMPLANT
BIT DRILL 12 (MISCELLANEOUS) ×2
BIT DRILL 12MM (MISCELLANEOUS) ×1
BIT DRILL 12X2.3XAVS ANCH-C (MISCELLANEOUS) IMPLANT
BIT DRILL NEURO 2X3.1 SFT TUCH (MISCELLANEOUS) ×1 IMPLANT
BIT DRL 12X2.3XAVS ANCH-C (MISCELLANEOUS) ×1
BLADE 15 SAFETY STRL DISP (BLADE) ×2 IMPLANT
BLADE ULTRA TIP 2M (BLADE) IMPLANT
BNDG GAUZE ELAST 4 BULKY (GAUZE/BANDAGES/DRESSINGS) IMPLANT
BUR BARREL STRAIGHT FLUTE 4.0 (BURR) ×3 IMPLANT
CAGE SPINAL 14X16X9 4D (Cage) ×1 IMPLANT
CAGE SPINAL 14X16X9MM 4DEG (Cage) ×1 IMPLANT
CANISTER SUCT 3000ML PPV (MISCELLANEOUS) ×3 IMPLANT
CARTRIDGE OIL MAESTRO DRILL (MISCELLANEOUS) ×1 IMPLANT
COVER MAYO STAND STRL (DRAPES) ×3 IMPLANT
DECANTER SPIKE VIAL GLASS SM (MISCELLANEOUS) ×3 IMPLANT
DERMABOND ADVANCED (GAUZE/BANDAGES/DRESSINGS) ×2
DERMABOND ADVANCED .7 DNX12 (GAUZE/BANDAGES/DRESSINGS) ×1 IMPLANT
DIFFUSER DRILL AIR PNEUMATIC (MISCELLANEOUS) ×3 IMPLANT
DRAPE HALF SHEET 40X57 (DRAPES) IMPLANT
DRAPE LAPAROTOMY 100X72 PEDS (DRAPES) ×3 IMPLANT
DRAPE MICROSCOPE LEICA (MISCELLANEOUS) ×3 IMPLANT
DRILL NEURO 2X3.1 SOFT TOUCH (MISCELLANEOUS) ×3
DRSG OPSITE POSTOP 3X4 (GAUZE/BANDAGES/DRESSINGS) ×2 IMPLANT
DURAPREP 6ML APPLICATOR 50/CS (WOUND CARE) ×3 IMPLANT
ELECT COATED BLADE 2.86 ST (ELECTRODE) ×3 IMPLANT
ELECT REM PT RETURN 9FT ADLT (ELECTROSURGICAL) ×3
ELECTRODE REM PT RTRN 9FT ADLT (ELECTROSURGICAL) ×1 IMPLANT
GAUZE SPONGE 4X4 12PLY STRL (GAUZE/BANDAGES/DRESSINGS) IMPLANT
GAUZE SPONGE 4X4 16PLY XRAY LF (GAUZE/BANDAGES/DRESSINGS) IMPLANT
GLOVE BIO SURGEON STRL SZ8 (GLOVE) ×3 IMPLANT
GLOVE BIOGEL PI IND STRL 8 (GLOVE) ×1 IMPLANT
GLOVE BIOGEL PI IND STRL 8.5 (GLOVE) ×1 IMPLANT
GLOVE BIOGEL PI INDICATOR 8 (GLOVE) ×2
GLOVE BIOGEL PI INDICATOR 8.5 (GLOVE) ×2
GLOVE ECLIPSE 8.0 STRL XLNG CF (GLOVE) ×3 IMPLANT
GLOVE EXAM NITRILE LRG STRL (GLOVE) IMPLANT
GLOVE EXAM NITRILE XL STR (GLOVE) IMPLANT
GLOVE EXAM NITRILE XS STR PU (GLOVE) IMPLANT
GOWN STRL REUS W/ TWL LRG LVL3 (GOWN DISPOSABLE) IMPLANT
GOWN STRL REUS W/ TWL XL LVL3 (GOWN DISPOSABLE) IMPLANT
GOWN STRL REUS W/TWL 2XL LVL3 (GOWN DISPOSABLE) IMPLANT
GOWN STRL REUS W/TWL LRG LVL3 (GOWN DISPOSABLE)
GOWN STRL REUS W/TWL XL LVL3 (GOWN DISPOSABLE)
HALTER HD/CHIN CERV TRACTION D (MISCELLANEOUS) ×3 IMPLANT
HEMOSTAT POWDER KIT SURGIFOAM (HEMOSTASIS) ×3 IMPLANT
KIT BASIN OR (CUSTOM PROCEDURE TRAY) ×3 IMPLANT
KIT TURNOVER KIT B (KITS) ×3 IMPLANT
NDL HYPO 25X1 1.5 SAFETY (NEEDLE) ×1 IMPLANT
NDL SPNL 22GX3.5 QUINCKE BK (NEEDLE) ×1 IMPLANT
NEEDLE HYPO 25X1 1.5 SAFETY (NEEDLE) ×3 IMPLANT
NEEDLE SPNL 22GX3.5 QUINCKE BK (NEEDLE) ×3 IMPLANT
NS IRRIG 1000ML POUR BTL (IV SOLUTION) ×3 IMPLANT
OIL CARTRIDGE MAESTRO DRILL (MISCELLANEOUS) ×3
PACK LAMINECTOMY NEURO (CUSTOM PROCEDURE TRAY) ×3 IMPLANT
PAD ARMBOARD 7.5X6 YLW CONV (MISCELLANEOUS) ×9 IMPLANT
PIN DISTRACTION 14MM (PIN) ×6 IMPLANT
RUBBERBAND STERILE (MISCELLANEOUS) ×6 IMPLANT
SCREW SPINAL AVS 3.5X12 (Screw) ×4 IMPLANT
SPONGE INTESTINAL PEANUT (DISPOSABLE) ×5 IMPLANT
SPONGE SURGIFOAM ABS GEL SZ50 (HEMOSTASIS) ×2 IMPLANT
STAPLER SKIN PROX WIDE 3.9 (STAPLE) ×2 IMPLANT
SUT VIC AB 3-0 SH 8-18 (SUTURE) ×6 IMPLANT
TOWEL GREEN STERILE (TOWEL DISPOSABLE) ×3 IMPLANT
TOWEL GREEN STERILE FF (TOWEL DISPOSABLE) ×3 IMPLANT
WATER STERILE IRR 1000ML POUR (IV SOLUTION) ×3 IMPLANT

## 2018-01-22 NOTE — H&P (Signed)
Patient ID:   [id number] Patient: Kirk [name]  Date of Birth: [birth year] Visit Type: Chart Update   Date: 01/17/2018 02:30 PM Provider: Marchia Meiers. Vertell Limber MD   This 72 year old male presents for back pain.  HISTORY OF PRESENT ILLNESS:  1.  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  He was last seen by Dr. Vertell Limber for ACDF following a fall in 1995.  Patient reports experiencing bilateral shoulder pain and "shocks" bilateral upper extremities and lower extremities while working 8 weeks ago.  Shocks were felt intermittently throughout the day and dissipated.  Bilateral shoulder back and buttock pain persisted.  Patient notes increasing bilateral lower extremity numbness tingling and weakness.  He notes falls if he stands unsupported, as inadvertently demonstrated during this visit. (He fell backwards into his chair, denying injury.)    History:  Squamous cell cancer (anus, abdomen, left clavicle Tx chemo +radiation) 6 years ago, hyperlipidemia, prediabetes Surgical history:  Valve (calf) replacement 2012, ACDF by Dr. Vertell Limber 1995  X-rays on [place], thoracic and lumbar MRI on [place]  Pt reports stumbling and recent inability to walk recently with some weakness, he presents using a walker. 4/5 right hand 4+/5 left hand.  Triceps 4/5 right 4+/5 left. Dorsiflexion left 4/5.  Positive Lhermitte's. C6-7 ruptured disc causing spinal stenosis. Pt needs cervical MRI. Lumbar MRI shows spondylolisthesis of L4-5, with a facet cyst at L1-2 on the right and facet cyst at C3-4 on the left. Pt needs Cervical MRI with and without contrast.           PAST MEDICAL/SURGICAL HISTORY   (Detailed)  Disease/disorder Onset Date Management Date Comments    Surgery, cervical spine 1995     valve replacement in calf    Cancer, squamous cell      Diabetes      Elevated lipids         Family History:  (Detailed) Relationship Family Member Name Deceased Age at Death  Condition Onset Age Cause of Death      Family history of Diabetes mellitus  N     Social History:  (Detailed) Tobacco use reviewed. Preferred language is [place].   Tobacco use status: Current non-smoker. Smoking status: Never smoker.  SMOKING STATUS Type Smoking Status Usage Per Day Years Used Total Pack Years   Never smoker          MEDICATIONS: (added, continued or stopped this visit)   ALLERGIES: Ingredient Reaction Medication Name Comment  OXYCODONE Seizure     Reviewed, updated.   REVIEW OF SYSTEMS   See scanned patient registration form, dated, signed and dated on   Review of Systems Details System Neg/Pos Details  Constitutional Negative Chills, Fatigue, Fever, Malaise, Night sweats, Weight gain and Weight loss.  ENMT Negative Ear drainage, Hearing loss, Nasal drainage, Otalgia, Sinus pressure and Sore throat.  Eyes Negative Eye discharge, Eye pain and Vision changes.  Respiratory Negative Chronic cough, Cough, Dyspnea, Known TB exposure and Wheezing.  Cardio Negative Chest pain, Claudication, Edema and Irregular heartbeat/palpitations.  GI Negative Abdominal pain, Blood in stool, Change in stool pattern, Constipation, Decreased appetite, Diarrhea, Heartburn, Nausea and Vomiting.  GU Negative Dribbling, Dysuria, Erectile dysfunction, Hematuria, Polyuria (Genitourinary), Slow stream, Urinary frequency, Urinary incontinence and Urinary retention.  Endocrine Negative Cold intolerance, Heat intolerance, Polydipsia and Polyphagia.  Neuro Positive Extremity weakness, Gait disturbance, Numbness in extremity.  Psych Negative Anxiety, Depression and Insomnia.  Integumentary Negative Brittle hair, Brittle nails, Change  in shape/size of mole(s), Hair loss, Hirsutism, Hives, Pruritus, Rash and Skin lesion.  MS Positive Back pain, Muscle weakness.  Hema/Lymph Negative Easy bleeding, Easy bruising and Lymphadenopathy.  Allergic/Immuno Negative Contact allergy, Environmental  allergies, Food allergies and Seasonal allergies.  Reproductive Negative Penile discharge and Sexual dysfunction.   PHYSICAL EXAM:   Vitals Date Temp F BP Pulse Ht In Wt Lb BMI BSA Pain Score  01/17/2018  120/63 83 71 230 32.08  2/10    PHYSICAL EXAM Details General Level of Distress: no acute distress Overall Appearance: normal  Head and Face  Right Left  Fundoscopic Exam:  normal normal    Cardiovascular Cardiac: regular rate and rhythm without murmur  Right Left  Carotid Pulses: normal normal  Respiratory Lungs: clear to auscultation  Neurological Orientation: normal Recent and Remote Memory: normal Attention Span and Concentration:   normal Language: normal Fund of Knowledge: normal  Right Left Sensation: normal normal Upper Extremity Coordination: normal normal  Lower Extremity Coordination: normal normal  Musculoskeletal Gait and Station: normal  Right Left Upper Extremity Muscle Strength: normal normal Lower Extremity Muscle Strength: normal normal Upper Extremity Muscle Tone:  normal normal Lower Extremity Muscle Tone: normal normal   Motor Strength Upper and lower extremity motor strength was tested in the clinically pertinent muscles. Any abnormal findings will be noted below.   Right Left Triceps: 4-/5 4/5 Grip: 4/5 4+/5 Finger Extensor: 4/5 4+/5 EHL:  4+/5   Deep Tendon Reflexes  Right Left Biceps: normal normal Triceps: normal normal Brachioradialis: normal normal Patellar: normal normal Achilles: normal normal  Sensory Sensation was tested at C2 to T1.   Cranial Nerves II. Optic Nerve/Visual Fields: normal III. Oculomotor: normal IV. Trochlear: normal V. Trigeminal: normal VI. Abducens: normal VII. Facial: normal VIII. Acoustic/Vestibular: normal IX. Glossopharyngeal: normal X. Vagus: normal XI. Spinal Accessory: normal XII. Hypoglossal: normal  Motor and other Tests Lhermittes: positive Rhomberg: negative Pronator  drift: absent     Right Left Spurlings negative negative Hoffman's: normal normal Clonus: normal normal Babinski: normal normal SLR: negative negative Patrick's Corky Sox): negative negative Toe Walk: normal normal Toe Lift: normal normal Heel Walk: normal normal Tinels Elbow: negative negative Tinels Wrist: negative negative Phalen: negative negative      IMPRESSION:   Pt reports stumbling and recent inability to walk recently with some weakness, he presents using a walker. 4/5 right hand 4+/5 left hand.  Triceps 4/5 right 4+/5 left. Dorsiflexion left 4/5. Negative Lhermitte's. C6-7 ruptured disc causing spinal stenosis. Pt needs cervical MRI. Lumbar MRI shows spondylolisthesis of L4-5, with a facet cyst at L1-2 on the right and facet cyst at C3-4 on the left. Pt needs Cervical MRI with and without contrast.   PLAN:  Schedule cervical MRI without contrast. Return for review.  Orders: Diagnostic Procedures: Assessment Procedure  M48.02 MRI Spinal/cerv W/o Contrast  M54.12 Cervical Spine- AP/Lat/Flex/Ex  M54.16 Lumbar Spine- AP/Lat/Flex/Ex  Instruction(s)/Education: Assessment Instruction  [phone] Dietary management education, guidance, and counseling   Completed Orders (this encounter) Order Details Reason Side Interpretation Result Initial Treatment Date Region  Lumbar Spine- AP/Lat/Flex/Ex 1 of 2     01/17/2018 All Levels to All Levels  Cervical Spine- AP/Lat/Flex/Ex 2 of 2     01/17/2018 All Levels to All Levels  Dietary management education, guidance, and counseling patient encouraged to eat a well balanced diet        MRI Spinal/cerv W/o Contrast      01/17/2018    Assessment/Plan   # Detail  Type Description   1. Assessment Synovial cyst of lumbar facet joint (M71.38).       2. Assessment Spondylolisthesis, lumbar region (M43.16).       3. Assessment Degenerative lumbar spinal stenosis (M48.061).       4. Assessment Lumbar radiculopathy (M54.16).       5.  Assessment Cervical radiculopathy (M54.12).       6. 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       7. 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       8. Assessment Body mass index (BMI) 32.0-32.9, adult (H96.22).   Plan Orders Today's instructions / counseling include(s) Dietary management education, guidance, and counseling.         Pain Management Plan Pain Scale: 2/10. Method: Numeric Pain Intensity Scale. Location: back. Onset: 11/17/2017. Duration: varies. Quality: discomforting. Pain management follow-up plan of care: Patient is taking OTC pain relievers for relief..              Provider:  Vertell Limber MD, Marchia Meiers 01/19/2018 1:33 PM  Dictation edited by: Marchia Meiers. Vertell Limber    CC Providers: [facility] [place],  [place]  [id number]-   [facility] [address], [place] [phone]-              Electronically signed by Marchia Meiers Vertell Limber MD on 01/19/2018 01:33 PM

## 2018-01-22 NOTE — Transfer of Care (Signed)
Immediate Anesthesia Transfer of Care Note  Patient: Kirk [name]  Procedure(s) Performed: ANTERIOR CERVICAL DECOMPRESSION/DISCECTOMY FUSION CERVICAL SIX- CERVICAL SEVEN (N/A )  Patient Location: PACU  Anesthesia Type:General  Level of Consciousness: awake, alert  and oriented  Airway & Oxygen Therapy: Patient Spontanous Breathing and Patient connected to nasal cannula oxygen  Post-op Assessment: Report given to RN, Post -op Vital signs reviewed and stable and Patient moving all extremities  Post vital signs: Reviewed and stable  Last Vitals:  Vitals Value Taken Time  BP 142/60 01/22/2018  3:59 PM  Temp 36.3 C 01/22/2018  3:59 PM  Pulse 65 01/22/2018  4:01 PM  Resp 12 01/22/2018  4:01 PM  SpO2 96 % 01/22/2018  4:01 PM  Vitals shown include unvalidated device data.  Last Pain:  Vitals:   01/22/18 1252  TempSrc:   PainSc: 0-No pain         Complications: No apparent anesthesia complications

## 2018-01-22 NOTE — Interval H&P Note (Signed)
History and Physical Interval Note:  01/22/2018 1:56 PM  Kirk [name]  has presented today for surgery, with the diagnosis of STENOSIS OF CERVICAL SPINE WITH MYELOPATHY  The various methods of treatment have been discussed with the patient and family. After consideration of risks, benefits and other options for treatment, the patient has consented to  Procedure(s) with comments: ANTERIOR CERVICAL DECOMPRESSION/DISCECTOMY FUSION CERVICAL SIX- CERVICAL SEVEN (N/A) - ANTERIOR CERVICAL DECOMPRESSION/DISCECTOMY FUSION CERVICAL SIX- CERVICAL SEVEN as a surgical intervention .  The patient's history has been reviewed, patient examined, no change in status, stable for surgery.  I have reviewed the patient's chart and labs.  Questions were answered to the patient's satisfaction.     Charleston Hankin D

## 2018-01-22 NOTE — Anesthesia Procedure Notes (Signed)
Procedure Name: Intubation Date/Time: 01/22/2018 2:11 PM Performed by: Leonor Liv, CRNA Pre-anesthesia Checklist: Patient identified, Emergency Drugs available, Suction available and Patient being monitored Patient Re-evaluated:Patient Re-evaluated prior to induction Oxygen Delivery Method: Circle System Utilized Preoxygenation: Pre-oxygenation with 100% oxygen Induction Type: IV induction Ventilation: Mask ventilation without difficulty Laryngoscope Size: Glidescope and 4 Grade View: Grade I Tube type: Oral Tube size: 7.5 mm Number of attempts: 1 Airway Equipment and Method: Stylet and Oral airway Placement Confirmation: ETT inserted through vocal cords under direct vision,  positive ETCO2 and breath sounds checked- equal and bilateral Secured at: 23 cm Tube secured with: Tape Dental Injury: Teeth and Oropharynx as per pre-operative assessment  Difficulty Due To: Difficult Airway- due to reduced neck mobility and Difficult Airway-  due to neck instability Comments: Patient positioned self on OR bed then reported numbness in left arm after lying supine with foam support in neutral position. Patient head supported with additional foam to patient's comfort level (verbalized decreased numbness) prior to induction. Glidescope used without additional movement of patient's head or neck. Dr. Vertell Limber notified of patient complaint of numbness to L arm. Surgeon positioned patient's head and neck following intubation.

## 2018-01-22 NOTE — Op Note (Signed)
01/22/2018  4:01 PM  PATIENT:  Kirk [name]  72 y.o. 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  PROCEDURE:  Procedure(s) with comments: ANTERIOR CERVICAL DECOMPRESSION/DISCECTOMY FUSION CERVICAL SIX- CERVICAL SEVEN (N/A) - ANTERIOR CERVICAL DECOMPRESSION/DISCECTOMY FUSION CERVICAL SIX- CERVICAL SEVEN with Anchor-C PEEK interbody graft with interfixated screws and autograft  SURGEON:  Surgeon(s) and Role:    Erline Levine, MD - Primary    Kristeen Miss, MD - Assisting  PHYSICIAN ASSISTANT:   ASSISTANTS: Poteat, RN   ANESTHESIA:   general  EBL:  25 mL   BLOOD ADMINISTERED:none  DRAINS: none   LOCAL MEDICATIONS USED:  MARCAINE    and LIDOCAINE   SPECIMEN:  No Specimen  DISPOSITION OF SPECIMEN:  N/A  COUNTS:  YES  TOURNIQUET:  * No tourniquets in log *  DICTATION: Patient is 72 year old male with herniated cervical disc C 67 , myelopathy, cord compression, HNP. He is having a great deal of difficulty with arm weakness and walking.   It was elected to take him to surgery for anterior cervical decompression and fusion C 67 level.  PROCEDURE: Patient was brought to operating room and following the smooth and uncomplicated induction of general endotracheal anesthesia his head was placed on a horseshoe head holder he was placed in 5 pounds of Holter traction and his anterior neck was prepped and draped in usual sterile fashion with betadine scrub and Duraprep. An incision was made on the left side of midline after infiltrating the skin and subcutaneous tissues with local lidocaine. The platysmal layer was incised and subplatysmal dissection was performed exposing the anterior border sternocleidomastoid muscle. Using blunt dissection the carotid sheath was kept lateral and trachea and esophagus kept medial  exposing the anterior cervical spine. The previously placed plate was exposed and the bone material overlying it was removed and saved for later bone grafting.  A bent spinal needle was placed it was felt to be the C 67 level and this was not well visualized on intraoperative x-ray, dur to patient's body habitus. Longus coli muscles were taken down from the anterior cervical spine using electrocautery and key elevator and self-retaining retractor was placed exposing the C 67 level. The interspace was incised and a thorough discectomy was performed. Disc space spreader was used.  Uncinate spurs and central spondylitic ridges were drilled down with a high-speed drill. The spinal cord dura and both C 7 nerve roots were widely decompressed. Hemostasis was assured. After trial sizing a 9 mm Anchor-C PEEK cage and plate was selected and packed with local autograft. This was tamped into position and countersunk appropriately. 12 mm variable-angle screws 1 at C 6, 1 at C 7 were placed. All screws were well-positioned and locking mechanisms were engaged. A final X ray was not  obtained as I did not feel this would show this level of the cervical spine at all. Soft tissues were inspected and found to be in good repair. The wound was irrigated. The platysma layer was closed with 3-0 Vicryl stitches and the skin was reapproximated with 3-0 Vicryl subcuticular stitches. The wound was dressed with Dermabond. Counts were correct at the end of the case. Patient was extubated and taken to recovery in stable and satisfactory condition.   PLAN OF CARE: Admit to inpatient   PATIENT DISPOSITION:  PACU -  hemodynamically stable.   Delay start of Pharmacological VTE agent (>24hrs) due to surgical blood loss or risk of bleeding: yes

## 2018-01-22 NOTE — Progress Notes (Signed)
Awake, alert, conversant.  Much improved bilateral arm pain, numbness and weakness.  MAEW with good power.  Doing well.

## 2018-01-22 NOTE — Anesthesia Preprocedure Evaluation (Addendum)
Anesthesia Evaluation  Patient identified by MRN, date of birth, ID band Patient awake    History of Anesthesia Complications Negative for: history of anesthetic complications  Airway Mallampati: II  TM Distance: >3 FB Neck ROM: Limited    Dental  (+) Teeth Intact, Dental Advisory Given   Pulmonary shortness of breath,    breath sounds clear to auscultation       Cardiovascular + CAD, + Past MI, + CABG and +CHF  + Valvular Problems/Murmurs (AVR 08/2011)  Rhythm:Regular Rate:Normal     Neuro/Psych Seizures -,     GI/Hepatic   Endo/Other  diabetes  Renal/GU Renal disease     Musculoskeletal   Abdominal   Peds  Hematology   Anesthesia Other Findings   Reproductive/Obstetrics                           Anesthesia Physical Anesthesia Plan  ASA: II  Anesthesia Plan: General   Post-op Pain Management:    Induction: Intravenous  PONV Risk Score and Plan: Ondansetron, Dexamethasone and Midazolam  Airway Management Planned: Oral ETT and Video Laryngoscope Planned  Additional Equipment:   Intra-op Plan:   Post-operative Plan: Extubation in OR  Informed Consent: I have reviewed the patients History and Physical, chart, labs and discussed the procedure including the risks, benefits and alternatives for the proposed anesthesia with the patient or authorized representative who has indicated his/her understanding and acceptance.   Dental advisory given  Plan Discussed with: Anesthesiologist and Surgeon  Anesthesia Plan Comments:         Anesthesia Quick Evaluation

## 2018-01-22 NOTE — Brief Op Note (Signed)
01/22/2018  4:01 PM  PATIENT:  Kirk [name] A [place]  72 y.o. 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  PROCEDURE:  Procedure(s) with comments: ANTERIOR CERVICAL DECOMPRESSION/DISCECTOMY FUSION CERVICAL SIX- CERVICAL SEVEN (N/A) - ANTERIOR CERVICAL DECOMPRESSION/DISCECTOMY FUSION CERVICAL SIX- CERVICAL SEVEN with Anchor-C PEEK interbody graft with interfixated screws and autograft  SURGEON:  Surgeon(s) and Role:    Erline Levine, MD - Primary    Kristeen Miss, MD - Assisting  PHYSICIAN ASSISTANT:   ASSISTANTS: Poteat, RN   ANESTHESIA:   general  EBL:  25 mL   BLOOD ADMINISTERED:none  DRAINS: none   LOCAL MEDICATIONS USED:  MARCAINE    and LIDOCAINE   SPECIMEN:  No Specimen  DISPOSITION OF SPECIMEN:  N/A  COUNTS:  YES  TOURNIQUET:  * No tourniquets in log *  DICTATION: Patient is 72 year old male with herniated cervical disc C 67 , myelopathy, cord compression, HNP. He is having a great deal of difficulty with arm weakness and walking.   It was elected to take him to surgery for anterior cervical decompression and fusion C 67 level.  PROCEDURE: Patient was brought to operating room and following the smooth and uncomplicated induction of general endotracheal anesthesia his head was placed on a horseshoe head holder he was placed in 5 pounds of Holter traction and his anterior neck was prepped and draped in usual sterile fashion with betadine scrub and Duraprep. An incision was made on the left side of midline after infiltrating the skin and subcutaneous tissues with local lidocaine. The platysmal layer was incised and subplatysmal dissection was performed exposing the anterior border sternocleidomastoid muscle. Using blunt dissection the carotid sheath was kept lateral and trachea and esophagus kept medial  exposing the anterior cervical spine. The previously placed plate was exposed and the bone material overlying it was removed and saved for later bone grafting.  A bent spinal needle was placed it was felt to be the C 67 level and this was not well visualized on intraoperative x-ray, dur to patient's body habitus. Longus coli muscles were taken down from the anterior cervical spine using electrocautery and key elevator and self-retaining retractor was placed exposing the C 67 level. The interspace was incised and a thorough discectomy was performed. Disc space spreader was used.  Uncinate spurs and central spondylitic ridges were drilled down with a high-speed drill. The spinal cord dura and both C 7 nerve roots were widely decompressed. Hemostasis was assured. After trial sizing a 9 mm Anchor-C PEEK cage and plate was selected and packed with local autograft. This was tamped into position and countersunk appropriately. 12 mm variable-angle screws 1 at C 6, 1 at C 7 were placed. All screws were well-positioned and locking mechanisms were engaged. A final X ray was not  obtained as I did not feel this would show this level of the cervical spine at all. Soft tissues were inspected and found to be in good repair. The wound was irrigated. The platysma layer was closed with 3-0 Vicryl stitches and the skin was reapproximated with 3-0 Vicryl subcuticular stitches. The wound was dressed with Dermabond. Counts were correct at the end of the case. Patient was extubated and taken to recovery in stable and satisfactory condition.   PLAN OF CARE: Admit to inpatient   PATIENT DISPOSITION:  PACU -  hemodynamically stable.   Delay start of Pharmacological VTE agent (>24hrs) due to surgical blood loss or risk of bleeding: yes

## 2018-01-22 NOTE — Plan of Care (Signed)

## 2018-01-23 DIAGNOSIS — I509 Heart failure, unspecified: Secondary | ICD-10-CM | POA: Diagnosis not present

## 2018-01-23 DIAGNOSIS — E785 Hyperlipidemia, unspecified: Secondary | ICD-10-CM | POA: Diagnosis not present

## 2018-01-23 DIAGNOSIS — I2510 Atherosclerotic heart disease of native coronary artery without angina pectoris: Secondary | ICD-10-CM | POA: Diagnosis not present

## 2018-01-23 DIAGNOSIS — E119 Type 2 diabetes mellitus without complications: Secondary | ICD-10-CM | POA: Diagnosis not present

## 2018-01-23 DIAGNOSIS — I252 Old myocardial infarction: Secondary | ICD-10-CM | POA: Diagnosis not present

## 2018-01-23 DIAGNOSIS — M50023 Cervical disc disorder at C6-C7 level with myelopathy: Secondary | ICD-10-CM | POA: Diagnosis not present

## 2018-01-23 LAB — GLUCOSE, CAPILLARY: Glucose-Capillary: 152 mg/dL — ABNORMAL HIGH (ref 65–99)

## 2018-01-23 MED ORDER — HYDROCODONE-ACETAMINOPHEN 5-325 MG PO TABS: 1.0000 | ORAL_TABLET | ORAL | 0 refills | Status: DC | PRN

## 2018-01-23 MED ORDER — METHOCARBAMOL 500 MG PO TABS: 500.0000 mg | ORAL_TABLET | Freq: Four times a day (QID) | ORAL | 1 refills | Status: DC | PRN

## 2018-01-23 MED FILL — Thrombin For Soln 5000 Unit: CUTANEOUS | Qty: 5000 | Status: AC

## 2018-01-23 NOTE — Care Management Obs Status (Signed)
[place] NOTIFICATION   Patient Details  Name: Kirk [name] MRN: [id number] Date of Birth: May 26, 1946   Medicare Observation Status Notification Given:  Yes    Carles Collet, RN 01/23/2018, 8:56 AM

## 2018-01-23 NOTE — Evaluation (Signed)
Physical Therapy Evaluation and Discharge Patient Details Name: Kirk [name] MRN: [id number] DOB: 16-Apr-1946 Today's Date: 01/23/2018   History of Present Illness  Pt is a 72 y.o. male s/p ACDF C6-7. PMH significant for squamous cell cancer, hyperlipidemia, prediabetes, valve replacement and ACDF (1995).  Clinical Impression  Patient evaluated by Physical Therapy with no further acute PT needs identified. All education has been completed and the patient has no further questions. At the time of PT eval pt was able to perform transfers, ambulation and stair negotiation with close supervision for safety to min guard assist. Pt and wife were educated on car transfer, brace application/wearing schedule, activity progression, and general cervical precautions.  See below for any follow-up Physical Therapy or equipment needs. PT is signing off. Thank you for this referral.     Follow Up Recommendations No PT follow up;Supervision for mobility/OOB    Equipment Recommendations  3in1 (PT);Cane    Recommendations for Other Services       Precautions / Restrictions Precautions Precautions: Cervical Precaution Booklet Issued: Yes (comment) Precaution Comments: Pt educated concerning cervical precautions related to ADL participation.  Required Braces or Orthoses: Cervical Brace Cervical Brace: Hard collar;At all times Restrictions Weight Bearing Restrictions: No      Mobility  Bed Mobility Overal bed mobility: Needs Assistance Bed Mobility: Rolling;Sidelying to Sit;Sit to Sidelying Rolling: Supervision Sidelying to sit: Supervision     Sit to sidelying: Supervision General bed mobility comments: Pt sitting EOB when PT arrived.   Transfers Overall transfer level: Needs assistance Equipment used: Straight cane Transfers: Sit to/from Stand Sit to Stand: Min guard         General transfer comment: Pt sit>stand well without difficulty, but was not able to control descent to EOB with  stand>sit.   Ambulation/Gait Ambulation/Gait assistance: Supervision Ambulation Distance (Feet): 200 Feet Assistive device: Straight cane Gait Pattern/deviations: Step-through pattern;Decreased stride length;Trunk flexed Gait velocity: Decreased Gait velocity interpretation: <1.31 ft/sec, indicative of household ambulator General Gait Details: VC's for improved posture and general safety. Somewhat unsteady at times, and showed improvement with the [facility].   Stairs Stairs: Yes Stairs assistance: Min guard Stair Management: One rail Left;With cane;Step to pattern;Forwards Number of Stairs: 2(1 stair x2) General stair comments: VC's for sequencing and general safety with stair negotiation  Wheelchair Mobility    Modified Rankin (Stroke Patients Only)       Balance Overall balance assessment: Needs assistance Sitting-balance support: No upper extremity supported;Feet supported Sitting balance-Leahy Scale: Good     Standing balance support: Single extremity supported;No upper extremity supported;During functional activity Standing balance-Leahy Scale: Fair Standing balance comment: Able to statically stand without UE support. Requires assistance to maintain balance during dynamic tasks.                              Pertinent Vitals/Pain Pain Assessment: Faces Faces Pain Scale: Hurts a little bit Pain Location: incision Pain Descriptors / Indicators: Operative site guarding Pain Intervention(s): Monitored during session    Home Living Family/patient expects to be discharged to:: Private residence Living Arrangements: Spouse/significant other Available Help at Discharge: Family;Available 24 hours/day Type of Home: House Home Access: Stairs to enter Entrance Stairs-Rails: None Entrance Stairs-Number of Steps: 2 Home Layout: One level Home Equipment: Walker - 2 wheels      Prior Function Level of Independence: Independent         Comments: Independent but  struggling. 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      Hand Dominance        Extremity/Trunk Assessment   Upper Extremity Assessment Upper Extremity Assessment: LUE deficits/detail LUE Deficits / Details: slight tingling intermittently    Lower Extremity Assessment Lower Extremity Assessment: Overall WFL for tasks assessed    Cervical / Trunk Assessment Cervical / Trunk Assessment: Other exceptions Cervical / Trunk Exceptions: s/p surgery  Communication   Communication: No difficulties  Cognition Arousal/Alertness: Awake/alert Behavior During Therapy: WFL for tasks assessed/performed Overall Cognitive Status: Within Functional Limits for tasks assessed                                        General Comments      Exercises     Assessment/Plan    PT Assessment Patent does not need any further PT services  PT Problem List         PT Treatment Interventions      PT Goals (Current goals can be found in the Care Plan section)  Acute Rehab PT Goals Patient Stated Goal: To get back to work  PT Goal Formulation: All assessment and education complete, DC therapy    Frequency     Barriers to discharge        Co-evaluation               AM-PAC PT "6 Clicks" Daily Activity  Outcome Measure Difficulty turning over in bed (including adjusting bedclothes, sheets and blankets)?: None Difficulty moving from lying on back to sitting on the side of the bed? : None Difficulty sitting down on and standing up from a chair with arms (e.g., wheelchair, bedside commode, etc,.)?: A Little Help needed moving to and from a bed to chair (including a wheelchair)?: A Little Help needed walking in hospital room?: A Little Help needed climbing 3-5 steps with a railing? : A Little 6 Click Score: 20    End of Session Equipment Utilized During Treatment: Gait belt;Cervical collar Activity  Tolerance: Patient tolerated treatment well Patient left: with call bell/phone within reach;with family/visitor present(Sitting EOB) Nurse Communication: Mobility status PT Visit Diagnosis: Unsteadiness on feet (R26.81);Pain Pain - part of body: (neck)    Time: 8657-8469 PT Time Calculation (min) (ACUTE ONLY): 17 min   Charges:   PT Evaluation $PT Eval Moderate Complexity: 1 Mod     PT G Codes:        Rolinda Roan, PT, DPT Acute Rehabilitation Services Pager: [phone]   Thelma Comp 01/23/2018, 11:00 AM

## 2018-01-23 NOTE — Progress Notes (Addendum)
Subjective: Patient reports "I'm all better"  Objective: Vital signs in last 24 hours: Temp:  [97.4 F (36.3 C)-98.4 F (36.9 C)] 97.8 F (36.6 C) (05/22 0432) Pulse Rate:  [66-93] 69 (05/22 0432) Resp:  [13-20] 20 (05/22 0432) BP: (121-157)/(59-87) 121/63 (05/22 0432) SpO2:  [94 %-99 %] 97 % (05/22 0432) Weight:  [98.9 kg (218 lb)] 98.9 kg (218 lb) (05/21 1212)  Intake/Output from previous day: 05/21 0701 - 05/22 0700 In: 1053 [I.V.:1053] Out: 25 [Blood:25] Intake/Output this shift: No intake/output data recorded.  Alert, conversant, reporting no pain at present. Improved strength BUE. Incision flat beneath honeycomb and Dermabond drsg. No erythema or drainage. Vista collar in use.   Lab Results: No results for input(s): WBC, HGB, HCT, PLT in the last 72 hours. BMET No results for input(s): NA, K, CL, CO2, GLUCOSE, BUN, CREATININE, CALCIUM in the last 72 hours.  Studies/Results: Dg Cervical Spine 1 View  Result Date: 01/22/2018 CLINICAL DATA:  C6-C7 ACDF EXAM: DG CERVICAL SPINE - 1 VIEW COMPARISON:  Portable cross-table lateral intraoperative image at 1451 hours compared to 01/17/2018 FINDINGS: Bones demineralized. Anterior plate and screws are present at C3-C6. The inferior most screws are present at the inferior C6 vertebral body and at the inferior endplate of C5 into the C5-C6 disc space. Metallic probe via anterior approach projects anterior to the inferior endplate of C5 and the A6-T0 disc space. IMPRESSION: Anterior localization of the level of the inferior endplate of C5 and the Z6-W1 disc space. Electronically Signed   By: Lavonia Dana M.D.   On: 01/22/2018 16:42    Assessment/Plan: Improved  LOS: 1 day  Per Dr. Vertell Limber, d/c to home. Rx's for Norco 5/325 and Robaxin 500mg  for prn home use. Pt will call for 3-4 week f/u appt. He and his wife verbalize understanding of d/c instructions.   Verdis Prime 01/23/2018, 7:50 AM   Patient is doing well.  Discharge home.

## 2018-01-23 NOTE — Care Management CC44 (Signed)
Condition Code 44 Documentation Completed  Patient Details  Name: Kirk [name] MRN: [id number] Date of Birth: 1945/10/26   Condition Code 44 given:  Yes Patient signature on Condition Code 44 notice:  Yes Documentation of 2 MD's agreement:  Yes Code 44 added to claim:  Yes    Carles Collet, RN 01/23/2018, 8:57 AM

## 2018-01-23 NOTE — Evaluation (Signed)
Occupational Therapy Evaluation and Discharge Patient Details Name: Kirk DEGEORGE MRN: [id number] DOB: [birth year] Today's Date: 01/23/2018    History of Present Illness Pt is a 72 y.o. male s/p ACDF C6-7. PMH significant for squamous cell cancer, hyperlipidemia, prediabetes, valve replacement and ACDF (1995).   Clinical Impression   PTA, pt was independent with ADL and functional mobility although he was limited in ability to work due to pain. Pt currently requiring min guard to min assist for toilet transfers without RW this session and recommended that pt utilize RW during recovery to maximize safety. Educated pt and wife concerning cervical precautions related to ADL participation, proper body mechanics, hard collar wear schedule and method to change out padding as well as don/doff brace for showering (per order set), and compensatory ADL strategies for LB ADL. Additionally educated pt concerning safe use of 3-in-1 for shower seat and safe shower transfer methods. Pt and wife verbalize and demonstrate understanding of all topics and report no further questions or concerns. No further acute OT needs identified. OT will sign off.     Follow Up Recommendations  No OT follow up;Supervision/Assistance - 24 hour    Equipment Recommendations  3 in 1 bedside commode    Recommendations for Other Services       Precautions / Restrictions Precautions Precautions: Cervical Precaution Booklet Issued: Yes (comment) Precaution Comments: Pt educated concerning cervical precautions related to ADL participation.  Restrictions Weight Bearing Restrictions: No      Mobility Bed Mobility Overal bed mobility: Needs Assistance Bed Mobility: Rolling;Sidelying to Sit;Sit to Sidelying Rolling: Supervision Sidelying to sit: Supervision     Sit to sidelying: Supervision General bed mobility comments: Supervision with verbal cues for log roll technique.   Transfers Overall transfer level: Needs  assistance Equipment used: Rolling walker (2 wheeled) Transfers: Sit to/from Stand Sit to Stand: Min guard         General transfer comment: Min guard assist for safety.     Balance Overall balance assessment: Needs assistance Sitting-balance support: No upper extremity supported;Feet supported Sitting balance-Leahy Scale: Good     Standing balance support: Single extremity supported;No upper extremity supported;During functional activity Standing balance-Leahy Scale: Fair Standing balance comment: Able to statically stand without UE support. Requires assistance to maintain balance during dynamic tasks.                            ADL either performed or assessed with clinical judgement   ADL Overall ADL's : Needs assistance/impaired Eating/Feeding: Set up;Sitting   Grooming: Supervision/safety;Standing   Upper Body Bathing: Set up;Sitting   Lower Body Bathing: Min guard;Sit to/from stand   Upper Body Dressing : Supervision/safety;Sitting   Lower Body Dressing: Min guard;Sit to/from stand Lower Body Dressing Details (indicate cue type and reason): cues to slow down and prevent LOB Toilet Transfer: Min guard;Minimal assistance;Ambulation Toilet Transfer Details (indicate cue type and reason): Completed without RW but pt with high level of instability. Recommended use of RW.  Toileting- Clothing Manipulation and Hygiene: Supervision/safety;Sit to/from stand   Tub/ Shower Transfer: Minimal assistance;Ambulation;Shower Scientist, research (medical) Details (indicate cue type and reason): Pt educated concerning safety measures with shower transfer.  Functional mobility during ADLs: Minimal assistance General ADL Comments: Pt unstable on feet and reports "I'm getting used to walking again." Pt and wife educated concerning safe compensatory strategies to incorporate during ADL participation.      Vision Patient Visual Report: No change from baseline  Vision Assessment?:  No apparent visual deficits     Perception     Praxis      Pertinent Vitals/Pain Pain Assessment: Faces Faces Pain Scale: Hurts a little bit Pain Location: incision Pain Descriptors / Indicators: Operative site guarding Pain Intervention(s): Monitored during session;Limited activity within patient's tolerance;Repositioned     Hand Dominance     Extremity/Trunk Assessment Upper Extremity Assessment Upper Extremity Assessment: LUE deficits/detail LUE Deficits / Details: slight tingling intermittently   Lower Extremity Assessment Lower Extremity Assessment: Overall WFL for tasks assessed       Communication Communication Communication: No difficulties   Cognition Arousal/Alertness: Awake/alert Behavior During Therapy: WFL for tasks assessed/performed Overall Cognitive Status: Within Functional Limits for tasks assessed                                     General Comments       Exercises     Shoulder Instructions      Home Living Family/patient expects to be discharged to:: Private residence Living Arrangements: Spouse/significant other Available Help at Discharge: Family;Available 24 hours/day Type of Home: House Home Access: Stairs to enter [employer] of Steps: 2 Entrance Stairs-Rails: None Home Layout: One level     Bathroom Shower/Tub: Occupational psychologist: Handicapped height     Home Equipment: Environmental consultant - 2 wheels          Prior Functioning/Environment Level of Independence: Independent        Comments: Independent but struggling. He is a Curator for a living.         OT Problem List: Decreased strength;Decreased activity tolerance;Impaired balance (sitting and/or standing);Decreased safety awareness;Decreased knowledge of use of DME or AE;Decreased knowledge of precautions;Pain      OT Treatment/Interventions:      OT Goals(Current goals can be found in the care plan section) Acute Rehab OT  Goals Patient Stated Goal: To get back to work  OT Goal Formulation: With patient/family  OT Frequency:     Barriers to D/C:            Co-evaluation              AM-PAC PT "6 Clicks" Daily Activity     Outcome Measure Help from another person eating meals?: None Help from another person taking care of personal grooming?: A Little Help from another person toileting, which includes using toliet, bedpan, or urinal?: A Little Help from another person bathing (including washing, rinsing, drying)?: A Little Help from another person to put on and taking off regular upper body clothing?: A Little Help from another person to put on and taking off regular lower body clothing?: A Little 6 Click Score: 19   End of Session Equipment Utilized During Treatment: Gait belt Nurse Communication: Mobility status  Activity Tolerance: Patient tolerated treatment well Patient left: with call bell/phone within reach;in bed;with family/visitor present(seated at EOB)  OT Visit Diagnosis: Other abnormalities of gait and mobility (R26.89);Pain Pain - part of body: (cervical)                Time: 4315-4008 OT Time Calculation (min): 39 min Charges:  OT General Charges $OT Visit: 1 Visit OT Evaluation $OT Eval Moderate Complexity: 1 Mod OT Treatments $Self Care/Home Management : 23-37 mins G-Codes:     Norman Herrlich, MS OTR/L  Pager: Kirk [name] 01/23/2018, 10:18 AM

## 2018-01-23 NOTE — Anesthesia Postprocedure Evaluation (Signed)
Anesthesia Post Note  Patient: Finn Amos Cordell  Procedure(s) Performed: ANTERIOR CERVICAL DECOMPRESSION/DISCECTOMY FUSION CERVICAL SIX- CERVICAL SEVEN (N/A )     Patient location during evaluation: PACU Anesthesia Type: General Level of consciousness: awake Pain management: pain level controlled Vital Signs Assessment: post-procedure vital signs reviewed and stable Respiratory status: spontaneous breathing Cardiovascular status: stable Anesthetic complications: no    Last Vitals:  Vitals:   01/23/18 0432 01/23/18 0848  BP: 121/63 132/85  Pulse: 69 68  Resp: 20 18  Temp: 36.6 C 36.8 C  SpO2: 97% 96%    Last Pain:  Vitals:   01/23/18 0848  TempSrc: Oral  PainSc:                  Orest Dygert

## 2018-01-23 NOTE — Discharge Summary (Signed)
Physician Discharge Summary  Patient ID: Kirk [name] MRN: [id number] DOB/AGE: 1946-02-09 72 y.o.  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 Following uncomplicated ACDF at K1-6, he recovered well and transferred to [facility] for nursing care. He is mobilizing well.   Consults: None  Significant Diagnostic Studies: radiology: X-Ray: intra-op  Treatments: surgery: ANTERIOR CERVICAL DECOMPRESSION/DISCECTOMY FUSION CERVICAL SIX- CERVICAL SEVEN (N/A) - ANTERIOR CERVICAL DECOMPRESSION/DISCECTOMY FUSION CERVICAL SIX- CERVICAL SEVEN with Anchor-C PEEK interbody graft with interfixated screws and autograft     Discharge Exam: Blood pressure 121/63, pulse 69, temperature 97.8 F (36.6 C), temperature source Oral, resp. rate 20, height 5' 10.98" (1.803 m), weight 98.9 kg (218 lb), SpO2 97 %. Alert, conversant, reporting no pain at present. Improved strength BUE. Incision flat beneath honeycomb and Dermabond drsg. No erythema or drainage. Vista collar in use.      Disposition:  Discharge to home. Rx's for Norco 5/325 and Robaxin 500mg  for prn home use. Pt will call for 3-4 week f/u appt. He and his wife verbalize understanding of d/c instructions.      Allergies as of 01/23/2018      Reactions   Cat Hair Extract Shortness Of Breath, Swelling   Atorvastatin Other (See  Comments)   Pt has muscle ache on Atorvastatin but IS ABLE TO TAKE LIPITOR without any issues   Dust Mite Extract Other (See Comments), Swelling   Metformin Other (See Comments)   Diarrhea, muscle cramps   Oxycodone Other (See Comments)   Seizure PATIENT DOES NOT WANT ANY NARCOTICS      Medication List    STOP taking these medications   naproxen 500 MG tablet Commonly known as:  NAPROSYN     TAKE these medications   amitriptyline 25 MG tablet Commonly known as:  ELAVIL Take 25 mg by mouth at bedtime as needed for sleep.   aspirin EC 81 MG tablet Take 81 mg by mouth daily.   Cholecalciferol 1000 units tablet 2,000 Units. Take 185 mg by mouth daily.   cyclobenzaprine 10 MG tablet Commonly known as:  FLEXERIL Take 1 tablet (10 mg total) by mouth 2 (two) times daily as needed for muscle spasms.   DULoxetine 60 MG capsule Commonly known as:  CYMBALTA Take 60 mg by mouth daily.   HYDROcodone-acetaminophen 5-325 MG tablet Commonly known as:  NORCO/VICODIN Take 1-2 tablets by mouth every 4 (four) hours as needed for severe pain ((score 7 to 10)).   LIPITOR 80 MG tablet Generic drug:  atorvastatin TAKE 1 TABLET BY MOUTH DAILY   LIPO-FLAVONOID PLUS Tabs Take 1 tablet by mouth 3 (three) times daily after meals.   losartan 50 MG tablet Commonly known as:  COZAAR Take 1 tablet (50 mg total) by mouth daily.   methocarbamol 500 MG tablet Commonly known as:  ROBAXIN Take 1 tablet (500 mg total) by mouth every 6 (six) hours as needed for muscle spasms.   metoprolol tartrate 25  MG tablet Commonly known as:  LOPRESSOR TAKE 1 TABLET BY MOUTH TWICE A DAY   ONGLYZA 5 MG Tabs tablet Generic drug:  saxagliptin HCl Take 5 mg by mouth daily.   OVER THE COUNTER MEDICATION Take 1 tablet by mouth daily. Z-Bec Zinc. vitamin B, E And C   oxyCODONE-acetaminophen 5-325 MG tablet Commonly known as:  PERCOCET/ROXICET Take 1 tablet by mouth every 4 (four) hours as needed for severe  pain.   VISINE OP Place 1 drop into both eyes as needed. Visine S        Signed: Verdis Prime 01/23/2018, 7:53 AM   Patient is doing well.  Discharge home.

## 2018-01-23 NOTE — Progress Notes (Signed)
Pt doing well. Pt and wife given D/C instructions with Rx's, verbal understanding was provided. Pt's incision is clean and dry with no sign of infection. Pt's IV was removed prior to D/C. Pt received cane and 3-n-1 from [place] per MD order. Pt is stable at D/C and has no other needs at this time. Pt D/C'd home via wheelchair @ 0945 per MD order. Holli Humbles, RN

## 2018-01-25 ENCOUNTER — Encounter (HOSPITAL_COMMUNITY): Payer: Self-pay | Admitting: Neurosurgery

## 2018-02-13 DIAGNOSIS — R030 Elevated blood-pressure reading, without diagnosis of hypertension: Secondary | ICD-10-CM | POA: Diagnosis not present

## 2018-02-13 DIAGNOSIS — M7138 Other bursal cyst, other site: Secondary | ICD-10-CM | POA: Diagnosis not present

## 2018-02-13 DIAGNOSIS — M4802 Spinal stenosis, cervical region: Secondary | ICD-10-CM | POA: Diagnosis not present

## 2018-02-13 DIAGNOSIS — G992 Myelopathy in diseases classified elsewhere: Secondary | ICD-10-CM | POA: Diagnosis not present

## 2018-02-13 DIAGNOSIS — M48061 Spinal stenosis, lumbar region without neurogenic claudication: Secondary | ICD-10-CM | POA: Diagnosis not present

## 2018-02-13 DIAGNOSIS — Z6830 Body mass index (BMI) 30.0-30.9, adult: Secondary | ICD-10-CM | POA: Diagnosis not present

## 2018-02-13 DIAGNOSIS — M5416 Radiculopathy, lumbar region: Secondary | ICD-10-CM | POA: Diagnosis not present

## 2018-02-21 ENCOUNTER — Encounter: Payer: Self-pay | Admitting: Gastroenterology

## 2018-02-21 DIAGNOSIS — M5416 Radiculopathy, lumbar region: Secondary | ICD-10-CM | POA: Diagnosis not present

## 2018-02-21 DIAGNOSIS — M4316 Spondylolisthesis, lumbar region: Secondary | ICD-10-CM | POA: Diagnosis not present

## 2018-03-04 DIAGNOSIS — N5201 Erectile dysfunction due to arterial insufficiency: Secondary | ICD-10-CM | POA: Diagnosis not present

## 2018-03-05 ENCOUNTER — Encounter: Payer: Self-pay | Admitting: Gastroenterology

## 2018-03-20 ENCOUNTER — Telehealth: Payer: Self-pay | Admitting: Radiation Oncology

## 2018-03-20 NOTE — Telephone Encounter (Signed)
I called the patient's wife and we discussed by phone that at this point in time if he plans to continue annual surveillance with Dr. Benay Spice, that follow-up with me would still be optional, though not necessary.  We reviewed NCCN guideline recommendations in surveillance, and the patient is also planning to see Dr. Zachery Conch in August for his endoscopy procedure/colonoscopy.  I anticipate that he will return to routine surveillance for colon cancer rather than frequent anoscopy given that he has been NED for 5 years since his initial treatment.  I encouraged the patient's wife to contact me if they have any questions or if they would like to be seen in the future.

## 2018-03-25 DIAGNOSIS — M4316 Spondylolisthesis, lumbar region: Secondary | ICD-10-CM | POA: Diagnosis not present

## 2018-03-25 DIAGNOSIS — M5416 Radiculopathy, lumbar region: Secondary | ICD-10-CM | POA: Diagnosis not present

## 2018-04-09 ENCOUNTER — Telehealth: Payer: Self-pay | Admitting: *Deleted

## 2018-04-09 DIAGNOSIS — R0989 Other specified symptoms and signs involving the circulatory and respiratory systems: Secondary | ICD-10-CM | POA: Diagnosis not present

## 2018-04-09 DIAGNOSIS — M4316 Spondylolisthesis, lumbar region: Secondary | ICD-10-CM | POA: Diagnosis not present

## 2018-04-09 DIAGNOSIS — Z136 Encounter for screening for cardiovascular disorders: Secondary | ICD-10-CM | POA: Diagnosis not present

## 2018-04-09 DIAGNOSIS — M5416 Radiculopathy, lumbar region: Secondary | ICD-10-CM | POA: Diagnosis not present

## 2018-04-09 DIAGNOSIS — I10 Essential (primary) hypertension: Secondary | ICD-10-CM | POA: Diagnosis not present

## 2018-04-09 DIAGNOSIS — E7800 Pure hypercholesterolemia, unspecified: Secondary | ICD-10-CM | POA: Diagnosis not present

## 2018-04-09 DIAGNOSIS — Z0000 Encounter for general adult medical examination without abnormal findings: Secondary | ICD-10-CM | POA: Diagnosis not present

## 2018-04-09 DIAGNOSIS — G629 Polyneuropathy, unspecified: Secondary | ICD-10-CM | POA: Diagnosis not present

## 2018-04-09 DIAGNOSIS — G992 Myelopathy in diseases classified elsewhere: Secondary | ICD-10-CM | POA: Diagnosis not present

## 2018-04-09 DIAGNOSIS — N183 Chronic kidney disease, stage 3 (moderate): Secondary | ICD-10-CM | POA: Diagnosis not present

## 2018-04-09 DIAGNOSIS — M7138 Other bursal cyst, other site: Secondary | ICD-10-CM | POA: Diagnosis not present

## 2018-04-09 DIAGNOSIS — G4700 Insomnia, unspecified: Secondary | ICD-10-CM | POA: Diagnosis not present

## 2018-04-09 DIAGNOSIS — R030 Elevated blood-pressure reading, without diagnosis of hypertension: Secondary | ICD-10-CM | POA: Diagnosis not present

## 2018-04-09 DIAGNOSIS — E291 Testicular hypofunction: Secondary | ICD-10-CM | POA: Diagnosis not present

## 2018-04-09 DIAGNOSIS — M4802 Spinal stenosis, cervical region: Secondary | ICD-10-CM | POA: Diagnosis not present

## 2018-04-09 DIAGNOSIS — M5412 Radiculopathy, cervical region: Secondary | ICD-10-CM | POA: Diagnosis not present

## 2018-04-09 DIAGNOSIS — Z1389 Encounter for screening for other disorder: Secondary | ICD-10-CM | POA: Diagnosis not present

## 2018-04-09 DIAGNOSIS — Z125 Encounter for screening for malignant neoplasm of prostate: Secondary | ICD-10-CM | POA: Diagnosis not present

## 2018-04-09 DIAGNOSIS — E1140 Type 2 diabetes mellitus with diabetic neuropathy, unspecified: Secondary | ICD-10-CM | POA: Diagnosis not present

## 2018-04-09 NOTE — Telephone Encounter (Signed)
Requested Echo review by [facility] Monday CRNA. Echo reviewed and patient cleared for anesthesia at [facility]

## 2018-04-19 ENCOUNTER — Ambulatory Visit (AMBULATORY_SURGERY_CENTER): Payer: Self-pay

## 2018-04-19 VITALS — Ht 71.0 in | Wt 228.0 lb

## 2018-04-19 DIAGNOSIS — Z85048 Personal history of other malignant neoplasm of rectum, rectosigmoid junction, and anus: Secondary | ICD-10-CM

## 2018-04-19 NOTE — Progress Notes (Signed)
No egg or soy allergy known to patient  No issues with past sedation with any surgeries  or procedures, no intubation problems  No diet pills per patient No home 02 use per patient  No blood thinners per patient  Pt denies issues with constipation  No A fib or A flutter currently EMMI video sent to pt's e mail. Pt denied

## 2018-04-29 DIAGNOSIS — L820 Inflamed seborrheic keratosis: Secondary | ICD-10-CM | POA: Diagnosis not present

## 2018-04-29 DIAGNOSIS — D1723 Benign lipomatous neoplasm of skin and subcutaneous tissue of right leg: Secondary | ICD-10-CM | POA: Diagnosis not present

## 2018-04-30 ENCOUNTER — Ambulatory Visit (INDEPENDENT_AMBULATORY_CARE_PROVIDER_SITE_OTHER): Payer: Medicare Other | Admitting: Cardiology

## 2018-04-30 ENCOUNTER — Encounter: Payer: Self-pay | Admitting: Cardiology

## 2018-04-30 VITALS — BP 118/70 | HR 71 | Ht 71.0 in | Wt 230.4 lb

## 2018-04-30 DIAGNOSIS — I2510 Atherosclerotic heart disease of native coronary artery without angina pectoris: Secondary | ICD-10-CM

## 2018-04-30 DIAGNOSIS — Z951 Presence of aortocoronary bypass graft: Secondary | ICD-10-CM

## 2018-04-30 DIAGNOSIS — I2583 Coronary atherosclerosis due to lipid rich plaque: Secondary | ICD-10-CM | POA: Diagnosis not present

## 2018-04-30 DIAGNOSIS — Z952 Presence of prosthetic heart valve: Secondary | ICD-10-CM | POA: Diagnosis not present

## 2018-04-30 NOTE — Patient Instructions (Signed)

## 2018-04-30 NOTE — Progress Notes (Signed)
[facility]. 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 Also has a supraclavicular and peri-abdominal aortic lymph node. Seen oncology at [facility] and had chemo/rad.   He is doing well from a cardiac standpoint. No shortness of breath. Cancer is in remission.   No changes in medications. LDL cholesterol 78 on 12/14, triglycerides 220 range. Trying to lose a little bit of weight. Has been up and down.  Shingles. Amotivational for a while. Grapey. Injection for ED.  Kirk [name] is his wife. Was recently at [facility],  [name], engineering. No anginal symptoms.  He has been battling a cold. Overall doing well. His wife unfortunately lost her mother. Father is 105 years old.  04/30/18 - Was out of work with neck, was down to 220. Dr. Vertell Limber.  Overall not having any fevers chills nausea vomiting syncope bleeding orthopnea.  Slowly getting back into shape.  Back in the pain.  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   Wt Readings from Last 3 Encounters:  04/30/18 230 lb 6.4 oz (104.5 kg)  04/19/18 228 lb (103.4 kg)  01/22/18 218 lb (98.9 kg)     Past Medical History:  Diagnosis Date  . Allergy   . Angina    6 wks. ago  . Aortic stenosis   . Arthritis    1995- cerv. fusion- [facility]  . Atrial fibrillation ([place]) 08/20/2011   Post op   . Blood in stool   . Cancer ([place]) 04/09/12   Rectum bx=invasive squamous cell carcinoma  . CHF (congestive heart failure) ([place])   . Coronary artery disease    aortic stenosis, CAD  . Diabetes mellitus    Type 2  . ED (erectile dysfunction)   . ED (erectile dysfunction)   . Elevated  cholesterol   . 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  . Heart murmur   . History of kidney stones    passed, lithrotrispy and surgery  . History of radiation therapy 05/20/12-06/28/12   anal cancer=54gy total dose  . Hypertension   . Kidney stones   . Malignant neoplasm of anal canal ([place]) 05/12/2012  . Malignant neoplasm of other sites of rectum, rectosigmoid junction, and anus 04/12/2012  . Myocardial infarction ([place]) 2012  . Personal history of colonic polyps 03/20/2012  . Rectal bleeding   . Rectal mass 03/20/2012  . 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   #16mm [facility] Ease pericardial tissue valve   . 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 Seizure disorder, grand mal ([place])   . Seizures ([place])    after taking Oxycodone; not prescribed seizure med  . Shortness of breath   . Thrombosed hemorrhoids     Past Surgical History:  Procedure Laterality Date  . ANAL FISSURE REPAIR     patient unsure of date  . ANTERIOR CERVICAL DECOMP/DISCECTOMY FUSION N/A 01/22/2018   Procedure: ANTERIOR CERVICAL DECOMPRESSION/DISCECTOMY FUSION CERVICAL SIX- CERVICAL SEVEN;  Surgeon: Erline Levine, MD;  Location: [facility];  Service: Neurosurgery;  Laterality:  N/A;  ANTERIOR CERVICAL DECOMPRESSION/DISCECTOMY FUSION CERVICAL SIX- CERVICAL SEVEN  . AORTIC VALVE REPLACEMENT  08/11/2011   Procedure: AORTIC VALVE REPLACEMENT (AVR);  Surgeon: Rexene Alberts, MD;  Location: [facility];  Service: Open Heart Surgery;  Laterality: N/A;  . [place]  . COLONOSCOPY    . CORONARY ARTERY BYPASS GRAFT  08/16/2011   Procedure: CORONARY ARTERY BYPASS GRAFTING (CABG);  Surgeon: Rexene Alberts, MD;  Location: [facility];  Service: Open Heart Surgery;  Laterality: N/A;  CABG times one using left internal mammary artery  . LITHOTRIPSY  2001 AND 2002   DR PETERSON  . POLYPECTOMY      Current Outpatient Medications  Medication Sig Dispense  Refill  . amitriptyline (ELAVIL) 25 MG tablet Take 25 mg by mouth at bedtime as needed for sleep.     Marland Kitchen aspirin EC 81 MG tablet Take 81 mg by mouth daily.    . Cholecalciferol 1000 UNITS tablet 2,000 Units. Take 185 mg by mouth daily.    . cyclobenzaprine (FLEXERIL) 10 MG tablet Take 1 tablet (10 mg total) by mouth 2 (two) times daily as needed for muscle spasms. 20 tablet 0  . DULoxetine (CYMBALTA) 60 MG capsule Take 60 mg by mouth daily.    Marland Kitchen HYDROcodone-acetaminophen (NORCO/VICODIN) 5-325 MG tablet Take 1-2 tablets by mouth every 4 (four) hours as needed for severe pain ((score 7 to 10)). 30 tablet 0  . LIPITOR 80 MG tablet TAKE 1 TABLET BY MOUTH DAILY 90 tablet 3  . losartan (COZAAR) 50 MG tablet Take 1 tablet (50 mg total) by mouth daily. 90 tablet 3  . methocarbamol (ROBAXIN) 500 MG tablet Take 1 tablet (500 mg total) by mouth every 6 (six) hours as needed for muscle spasms. 60 tablet 1  . metoprolol tartrate (LOPRESSOR) 25 MG tablet TAKE 1 TABLET BY MOUTH TWICE A DAY 180 tablet 3  . OVER THE COUNTER MEDICATION Take 1 tablet by mouth daily. Z-Bec Zinc. vitamin B, E And C    . oxyCODONE-acetaminophen (PERCOCET/ROXICET) 5-325 MG tablet Take 1 tablet by mouth every 4 (four) hours as needed for severe pain. 15 tablet 0  . saxagliptin HCl (ONGLYZA) 5 MG TABS tablet Take 5 mg by mouth daily.    . Tetrahydrozoline HCl (VISINE OP) Place 1 drop into both eyes as needed. Visine S    . Vitamins-Lipotropics (LIPO-FLAVONOID PLUS) TABS Take 1 tablet by mouth 3 (three) times daily after meals.       No current facility-administered medications for this visit.     Allergies:    Allergies  Allergen Reactions  . Cat Hair Extract Shortness Of Breath and Swelling  . Atorvastatin Other (See Comments)    Pt has muscle ache on Atorvastatin but IS ABLE TO TAKE LIPITOR without any issues  . Dust Mite Extract Other (See Comments) and Swelling  . Metformin Other (See Comments)    Diarrhea, muscle cramps  .  Oxycodone Other (See Comments)    Seizure PATIENT DOES NOT WANT ANY NARCOTICS    Social History:  The patient  reports that he has never smoked. He has never used smokeless tobacco. He reports that he drank alcohol. He reports that he does not use drugs.  Kirk [name] is his wife. [name].  ROS:  Please see the history of present illness.     PHYSICAL EXAM: VS:  BP 118/70   Pulse 71   Ht 5\' 11"  (1.803 m)   Wt  230 lb 6.4 oz (104.5 kg)   BMI 32.13 kg/m  GEN: Well nourished, well developed, in no acute distress  HEENT: normal  Neck: no JVD, carotid bruits, or masses Cardiac: RRR; 2/6 SM, no rubs, or gallops,no edema  Respiratory:  clear to auscultation bilaterally, normal work of breathing GI: soft, nontender, nondistended, + BS MS: no deformity or atrophy  Skin: warm and dry, no rash Neuro:  Alert and Oriented x 3, Strength and sensation are intact Psych: euthymic mood, full affect   EKG:    Today: 04/30/2018-sinus rhythm 71 poor R wave progression nonspecific T wave changes no other abnormal  04/30/17-sinus bradycardia rate 53 with no other abnormalities. 02/17/16 - SB 51 no changes. 02/04/15-sinus or bradycardia rate 56 with sinus arrhythmia, otherwise normal. 03/03/14-sinus bradycardia rate 55, incomplete left bundle branch block     ECHO 03/14/16: - Left ventricle: The cavity size was normal. There was moderate   focal basal and mild concentric hypertrophy. Systolic function   was normal. The estimated ejection fraction was in the range of   60% to 65%. Wall motion was normal; there were no regional wall   motion abnormalities. Features are consistent with a pseudonormal   left ventricular filling pattern, with concomitant abnormal   relaxation and increased filling pressure (grade 2 diastolic   dysfunction). Doppler parameters are consistent with high   ventricular filling pressure. - Aortic valve: A bioprosthesis was present. There was mild to   moderate stenosis. - Aorta: Aortic  root dimension: 38 mm (ED). - Aortic root: The aortic root was mildly dilated. - Mitral valve: Severely calcified annulus. There was trivial   regurgitation.  ASSESSMENT AND PLAN:  1. Coronary artery disease-status post bypass.  December 2012.  Doing very well, no anginal symptoms. Bypass was done in the setting of aortic valve replacement.  Reassuring. 2. Aortic valve replacement-bioprosthetic-doing very well. Soft murmur heard.  Dental antibiotics, echocardiogram reviewed once again 2017.  Overall doing well. 3. ED - Grapey, injection.  No new issues. 4. Dysthymia - feeling better now. [name], working hard currently. Enjoys fishing. Seems to be stable.  through back issues. 5. Hyperlipidemia-excellent lipid results however triglycerides are mildly elevated. Weight loss, diet. Continue with statin therapy. Continue with current therapy.  LDL 113, triglycerides 123 6. Obesity-continue with weight loss.  10 you with encouragement. 7. 1 year follow up.   Signed, [name], MD [facility]  04/30/2018 8:37 AM

## 2018-05-02 ENCOUNTER — Ambulatory Visit (AMBULATORY_SURGERY_CENTER): Payer: Medicare Other | Admitting: Gastroenterology

## 2018-05-02 ENCOUNTER — Encounter: Payer: Self-pay | Admitting: Gastroenterology

## 2018-05-02 VITALS — BP 91/44 | HR 94 | Temp 99.1°F | Resp 18 | Ht 71.0 in | Wt 228.0 lb

## 2018-05-02 DIAGNOSIS — D122 Benign neoplasm of ascending colon: Secondary | ICD-10-CM

## 2018-05-02 DIAGNOSIS — D123 Benign neoplasm of transverse colon: Secondary | ICD-10-CM | POA: Diagnosis not present

## 2018-05-02 DIAGNOSIS — D120 Benign neoplasm of cecum: Secondary | ICD-10-CM | POA: Diagnosis not present

## 2018-05-02 DIAGNOSIS — Z85048 Personal history of other malignant neoplasm of rectum, rectosigmoid junction, and anus: Secondary | ICD-10-CM

## 2018-05-02 DIAGNOSIS — D124 Benign neoplasm of descending colon: Secondary | ICD-10-CM

## 2018-05-02 DIAGNOSIS — Z1211 Encounter for screening for malignant neoplasm of colon: Secondary | ICD-10-CM | POA: Diagnosis not present

## 2018-05-02 MED ORDER — SODIUM CHLORIDE 0.9 % IV SOLN: 500.0000 mL | Freq: Once | INTRAVENOUS | Status: DC

## 2018-05-02 NOTE — Progress Notes (Signed)
A/ox3 pleased with MAC, report to RN 

## 2018-05-02 NOTE — Progress Notes (Signed)
Pt's states no medical or surgical changes since previsit or office visit. 

## 2018-05-02 NOTE — Patient Instructions (Signed)
Handout given to patient on polyps.  YOU HAD AN ENDOSCOPIC PROCEDURE TODAY AT [facility]:   Refer to the procedure report that was given to you for any specific questions about what was found during the examination.  If the procedure report does not answer your questions, please call your gastroenterologist to clarify.  If you requested that your care partner not be given the details of your procedure findings, then the procedure report has been included in a sealed envelope for you to review at your convenience later.  YOU SHOULD EXPECT: Some feelings of bloating in the abdomen. Passage of more gas than usual.  Walking can help get rid of the air that was put into your GI tract during the procedure and reduce the bloating. If you had a lower endoscopy (such as a colonoscopy or flexible sigmoidoscopy) you may notice spotting of blood in your stool or on the toilet paper. If you underwent a bowel prep for your procedure, you may not have a normal bowel movement for a few days.  Please Note:  You might notice some irritation and congestion in your nose or some drainage.  This is from the oxygen used during your procedure.  There is no need for concern and it should clear up in a day or so.  SYMPTOMS TO REPORT IMMEDIATELY:   Following lower endoscopy (colonoscopy or flexible sigmoidoscopy):  Excessive amounts of blood in the stool  Significant tenderness or worsening of abdominal pains  Swelling of the abdomen that is new, acute  Fever of 100F or higher   For urgent or emergent issues, a gastroenterologist can be reached at any hour by calling [phone].   DIET:  We do recommend a small meal at first, but then you may proceed to your regular diet.  Drink plenty of fluids but you should avoid alcoholic beverages for 24 hours.  ACTIVITY:  You should plan to take it easy for the rest of today and you should NOT DRIVE or use heavy machinery until tomorrow (because of the  sedation medicines used during the test).    FOLLOW UP: Our staff will call the number listed on your records the next business day following your procedure to check on you and address any questions or concerns that you may have regarding the information given to you following your procedure. If we do not reach you, we will leave a message.  However, if you are feeling well and you are not experiencing any problems, there is no need to return our call.  We will assume that you have returned to your regular daily activities without incident.  If any biopsies were taken you will be contacted by phone or by letter within the next 1-3 weeks.  Please call us at [phone] if you have not heard about the biopsies in 3 weeks.    SIGNATURES/CONFIDENTIALITY: You and/or your care partner have signed paperwork which will be entered into your electronic medical record.  These signatures attest to the fact that that the information above on your After Visit Summary has been reviewed and is understood.  Full responsibility of the confidentiality of this discharge information lies with you and/or your care-partner.

## 2018-05-02 NOTE — Op Note (Signed)
[facility] Patient Name: Kirk [name] Procedure Date: 05/02/2018 2:08 PM MRN: [id number] Endoscopist: Remo Lipps P. Havery Moros , MD Age: 72 Referring MD:  Date of Birth: 02-23-1946 Gender: Male Account #: [id number] Procedure:                Colonoscopy Indications:              High risk colon cancer surveillance: Personal                            history of rectal cancer treated with chemotherapy                            and radiation Medicines:                Monitored Anesthesia Care Procedure:                Pre-Anesthesia Assessment:                           - Prior to the procedure, a History and Physical                            was performed, and patient medications and                            allergies were reviewed. The patient's tolerance of                            previous anesthesia was also reviewed. The risks                            and benefits of the procedure and the sedation                            options and risks were discussed with the patient.                            All questions were answered, and informed consent                            was obtained. Prior Anticoagulants: The patient has                            taken no previous anticoagulant or antiplatelet                            agents. ASA Grade Assessment: III - A patient with                            severe systemic disease. After reviewing the risks                            and benefits, the patient was deemed in  satisfactory condition to undergo the procedure.                           After obtaining informed consent, the colonoscope                            was passed under direct vision. Throughout the                            procedure, the patient's blood pressure, pulse, and                            oxygen saturations were monitored continuously. The                            Colonoscope was introduced through the anus  and                            advanced to the the cecum, identified by                            appendiceal orifice and ileocecal valve. The                            colonoscopy was performed without difficulty. The                            patient tolerated the procedure well. The quality                            of the bowel preparation was adequate. The                            ileocecal valve, appendiceal orifice, and rectum                            were photographed. Scope In: 2:11:20 PM Scope Out: 2:46:00 PM Scope Withdrawal Time: 0 hours 29 minutes 5 seconds  Total Procedure Duration: 0 hours 34 minutes 40 seconds  Findings:                 The perianal and digital rectal examinations were                            normal.                           A 3 mm polyp was found in the cecum. The polyp was                            sessile. The polyp was removed with a cold snare.                            Resection and retrieval were complete.  A 4 mm polyp was found in the ascending colon. The                            polyp was sessile. The polyp was removed with a                            cold snare. Resection and retrieval were complete.                           Seven sessile polyps were found in the transverse                            colon. The polyps were 3 to 5 mm in size. These                            polyps were removed with a cold snare. Resection                            and retrieval were complete.                           A 4 mm polyp was found in the descending colon. The                            polyp was sessile. The polyp was removed with a                            cold snare. Resection and retrieval were complete.                           The colon was redundant.                           The rectal vault was small and atophic appearing,                            likely due to prior radiation. No evidence of                             recurrent malignancy. The exam was otherwise                            without abnormality. Complications:            No immediate complications. Estimated blood loss:                            Minimal. Estimated Blood Loss:     Estimated blood loss was minimal. Impression:               - One 3 mm polyp in the cecum, removed with a cold  snare. Resected and retrieved.                           - One 4 mm polyp in the ascending colon, removed                            with a cold snare. Resected and retrieved.                           - Seven 3 to 5 mm polyps in the transverse colon,                            removed with a cold snare. Resected and retrieved.                           - One 4 mm polyp in the descending colon, removed                            with a cold snare. Resected and retrieved.                           - Redundant colon.                           - The examination was otherwise normal. Recommendation:           - Patient has a contact number available for                            emergencies. The signs and symptoms of potential                            delayed complications were discussed with the                            patient. Return to normal activities tomorrow.                            Written discharge instructions were provided to the                            patient.                           - Resume previous diet.                           - Continue present medications.                           - Await pathology results.                           - Repeat colonoscopy for surveillance based on  pathology results. Remo Lipps P. Verner Kopischke, MD 05/02/2018 2:52:41 PM This report has been signed electronically.

## 2018-05-02 NOTE — Progress Notes (Signed)
Called to room to assist during endoscopic procedure.  Patient ID and intended procedure confirmed with present staff. Received instructions for my participation in the procedure from the performing physician.  

## 2018-05-07 ENCOUNTER — Telehealth: Payer: Self-pay

## 2018-05-07 NOTE — Telephone Encounter (Signed)
  Follow up Call-  Call back number 05/02/2018  Post procedure Call Back phone  # [phone] wife number ok to speak with her.  Permission to leave phone message Yes  Some recent data might be hidden     Patient questions:  Do you have a fever, pain , or abdominal swelling? No. Pain Score  0 *  Have you tolerated food without any problems? Yes.    Have you been able to return to your normal activities? Yes.    Do you have any questions about your discharge instructions: Diet   No. Medications  No. Follow up visit  No.  Do you have questions or concerns about your Care? No.  Actions: * If pain score is 4 or above: No action needed, pain <4.  Spouse stated IV site was painful and swollen. Site is getting better. No drainage or redness. Instructed to call back if worsen. Ice packs were applied.

## 2018-05-13 ENCOUNTER — Other Ambulatory Visit: Payer: Self-pay | Admitting: Cardiology

## 2018-05-20 DIAGNOSIS — M4316 Spondylolisthesis, lumbar region: Secondary | ICD-10-CM | POA: Diagnosis not present

## 2018-05-20 DIAGNOSIS — M5416 Radiculopathy, lumbar region: Secondary | ICD-10-CM | POA: Diagnosis not present

## 2018-05-27 DIAGNOSIS — R262 Difficulty in walking, not elsewhere classified: Secondary | ICD-10-CM | POA: Diagnosis not present

## 2018-05-27 DIAGNOSIS — M545 Low back pain: Secondary | ICD-10-CM | POA: Diagnosis not present

## 2018-05-27 DIAGNOSIS — M2560 Stiffness of unspecified joint, not elsewhere classified: Secondary | ICD-10-CM | POA: Diagnosis not present

## 2018-05-27 DIAGNOSIS — M5416 Radiculopathy, lumbar region: Secondary | ICD-10-CM | POA: Diagnosis not present

## 2018-05-30 DIAGNOSIS — R262 Difficulty in walking, not elsewhere classified: Secondary | ICD-10-CM | POA: Diagnosis not present

## 2018-05-30 DIAGNOSIS — M2560 Stiffness of unspecified joint, not elsewhere classified: Secondary | ICD-10-CM | POA: Diagnosis not present

## 2018-05-30 DIAGNOSIS — M545 Low back pain: Secondary | ICD-10-CM | POA: Diagnosis not present

## 2018-05-30 DIAGNOSIS — M5416 Radiculopathy, lumbar region: Secondary | ICD-10-CM | POA: Diagnosis not present

## 2018-06-03 DIAGNOSIS — M2560 Stiffness of unspecified joint, not elsewhere classified: Secondary | ICD-10-CM | POA: Diagnosis not present

## 2018-06-03 DIAGNOSIS — M545 Low back pain: Secondary | ICD-10-CM | POA: Diagnosis not present

## 2018-06-03 DIAGNOSIS — R262 Difficulty in walking, not elsewhere classified: Secondary | ICD-10-CM | POA: Diagnosis not present

## 2018-06-03 DIAGNOSIS — M5416 Radiculopathy, lumbar region: Secondary | ICD-10-CM | POA: Diagnosis not present

## 2018-06-06 DIAGNOSIS — M2560 Stiffness of unspecified joint, not elsewhere classified: Secondary | ICD-10-CM | POA: Diagnosis not present

## 2018-06-06 DIAGNOSIS — M5416 Radiculopathy, lumbar region: Secondary | ICD-10-CM | POA: Diagnosis not present

## 2018-06-06 DIAGNOSIS — R262 Difficulty in walking, not elsewhere classified: Secondary | ICD-10-CM | POA: Diagnosis not present

## 2018-06-06 DIAGNOSIS — M545 Low back pain: Secondary | ICD-10-CM | POA: Diagnosis not present

## 2018-06-07 DIAGNOSIS — M545 Low back pain: Secondary | ICD-10-CM | POA: Diagnosis not present

## 2018-06-07 DIAGNOSIS — M2560 Stiffness of unspecified joint, not elsewhere classified: Secondary | ICD-10-CM | POA: Diagnosis not present

## 2018-06-07 DIAGNOSIS — R262 Difficulty in walking, not elsewhere classified: Secondary | ICD-10-CM | POA: Diagnosis not present

## 2018-06-07 DIAGNOSIS — M5416 Radiculopathy, lumbar region: Secondary | ICD-10-CM | POA: Diagnosis not present

## 2018-06-10 DIAGNOSIS — R262 Difficulty in walking, not elsewhere classified: Secondary | ICD-10-CM | POA: Diagnosis not present

## 2018-06-10 DIAGNOSIS — M545 Low back pain: Secondary | ICD-10-CM | POA: Diagnosis not present

## 2018-06-10 DIAGNOSIS — M2560 Stiffness of unspecified joint, not elsewhere classified: Secondary | ICD-10-CM | POA: Diagnosis not present

## 2018-06-10 DIAGNOSIS — M5416 Radiculopathy, lumbar region: Secondary | ICD-10-CM | POA: Diagnosis not present

## 2018-06-12 DIAGNOSIS — R030 Elevated blood-pressure reading, without diagnosis of hypertension: Secondary | ICD-10-CM | POA: Diagnosis not present

## 2018-06-12 DIAGNOSIS — M4316 Spondylolisthesis, lumbar region: Secondary | ICD-10-CM | POA: Diagnosis not present

## 2018-06-12 DIAGNOSIS — M5416 Radiculopathy, lumbar region: Secondary | ICD-10-CM | POA: Diagnosis not present

## 2018-06-12 DIAGNOSIS — M4802 Spinal stenosis, cervical region: Secondary | ICD-10-CM | POA: Diagnosis not present

## 2018-06-12 DIAGNOSIS — G992 Myelopathy in diseases classified elsewhere: Secondary | ICD-10-CM | POA: Diagnosis not present

## 2018-06-12 DIAGNOSIS — Z6831 Body mass index (BMI) 31.0-31.9, adult: Secondary | ICD-10-CM | POA: Diagnosis not present

## 2018-06-12 DIAGNOSIS — M7138 Other bursal cyst, other site: Secondary | ICD-10-CM | POA: Diagnosis not present

## 2018-06-13 DIAGNOSIS — M545 Low back pain: Secondary | ICD-10-CM | POA: Diagnosis not present

## 2018-06-13 DIAGNOSIS — R262 Difficulty in walking, not elsewhere classified: Secondary | ICD-10-CM | POA: Diagnosis not present

## 2018-06-13 DIAGNOSIS — M5416 Radiculopathy, lumbar region: Secondary | ICD-10-CM | POA: Diagnosis not present

## 2018-06-13 DIAGNOSIS — M2560 Stiffness of unspecified joint, not elsewhere classified: Secondary | ICD-10-CM | POA: Diagnosis not present

## 2018-06-14 DIAGNOSIS — M5416 Radiculopathy, lumbar region: Secondary | ICD-10-CM | POA: Diagnosis not present

## 2018-06-14 DIAGNOSIS — M2560 Stiffness of unspecified joint, not elsewhere classified: Secondary | ICD-10-CM | POA: Diagnosis not present

## 2018-06-14 DIAGNOSIS — M545 Low back pain: Secondary | ICD-10-CM | POA: Diagnosis not present

## 2018-06-14 DIAGNOSIS — R262 Difficulty in walking, not elsewhere classified: Secondary | ICD-10-CM | POA: Diagnosis not present

## 2018-06-18 DIAGNOSIS — M5416 Radiculopathy, lumbar region: Secondary | ICD-10-CM | POA: Diagnosis not present

## 2018-06-18 DIAGNOSIS — M2560 Stiffness of unspecified joint, not elsewhere classified: Secondary | ICD-10-CM | POA: Diagnosis not present

## 2018-06-18 DIAGNOSIS — R262 Difficulty in walking, not elsewhere classified: Secondary | ICD-10-CM | POA: Diagnosis not present

## 2018-06-18 DIAGNOSIS — M545 Low back pain: Secondary | ICD-10-CM | POA: Diagnosis not present

## 2018-06-20 DIAGNOSIS — M545 Low back pain: Secondary | ICD-10-CM | POA: Diagnosis not present

## 2018-06-20 DIAGNOSIS — M5416 Radiculopathy, lumbar region: Secondary | ICD-10-CM | POA: Diagnosis not present

## 2018-06-20 DIAGNOSIS — M2560 Stiffness of unspecified joint, not elsewhere classified: Secondary | ICD-10-CM | POA: Diagnosis not present

## 2018-06-20 DIAGNOSIS — R262 Difficulty in walking, not elsewhere classified: Secondary | ICD-10-CM | POA: Diagnosis not present

## 2018-06-27 DIAGNOSIS — M2560 Stiffness of unspecified joint, not elsewhere classified: Secondary | ICD-10-CM | POA: Diagnosis not present

## 2018-06-27 DIAGNOSIS — M545 Low back pain: Secondary | ICD-10-CM | POA: Diagnosis not present

## 2018-06-27 DIAGNOSIS — M5416 Radiculopathy, lumbar region: Secondary | ICD-10-CM | POA: Diagnosis not present

## 2018-06-27 DIAGNOSIS — R262 Difficulty in walking, not elsewhere classified: Secondary | ICD-10-CM | POA: Diagnosis not present

## 2018-06-28 DIAGNOSIS — R262 Difficulty in walking, not elsewhere classified: Secondary | ICD-10-CM | POA: Diagnosis not present

## 2018-06-28 DIAGNOSIS — M2560 Stiffness of unspecified joint, not elsewhere classified: Secondary | ICD-10-CM | POA: Diagnosis not present

## 2018-06-28 DIAGNOSIS — M545 Low back pain: Secondary | ICD-10-CM | POA: Diagnosis not present

## 2018-06-28 DIAGNOSIS — M5416 Radiculopathy, lumbar region: Secondary | ICD-10-CM | POA: Diagnosis not present

## 2018-07-01 DIAGNOSIS — M2560 Stiffness of unspecified joint, not elsewhere classified: Secondary | ICD-10-CM | POA: Diagnosis not present

## 2018-07-01 DIAGNOSIS — R262 Difficulty in walking, not elsewhere classified: Secondary | ICD-10-CM | POA: Diagnosis not present

## 2018-07-01 DIAGNOSIS — M5416 Radiculopathy, lumbar region: Secondary | ICD-10-CM | POA: Diagnosis not present

## 2018-07-01 DIAGNOSIS — M545 Low back pain: Secondary | ICD-10-CM | POA: Diagnosis not present

## 2018-07-03 DIAGNOSIS — M545 Low back pain: Secondary | ICD-10-CM | POA: Diagnosis not present

## 2018-07-03 DIAGNOSIS — R262 Difficulty in walking, not elsewhere classified: Secondary | ICD-10-CM | POA: Diagnosis not present

## 2018-07-03 DIAGNOSIS — M5416 Radiculopathy, lumbar region: Secondary | ICD-10-CM | POA: Diagnosis not present

## 2018-07-03 DIAGNOSIS — M2560 Stiffness of unspecified joint, not elsewhere classified: Secondary | ICD-10-CM | POA: Diagnosis not present

## 2018-07-09 DIAGNOSIS — M545 Low back pain: Secondary | ICD-10-CM | POA: Diagnosis not present

## 2018-07-09 DIAGNOSIS — R262 Difficulty in walking, not elsewhere classified: Secondary | ICD-10-CM | POA: Diagnosis not present

## 2018-07-09 DIAGNOSIS — M2560 Stiffness of unspecified joint, not elsewhere classified: Secondary | ICD-10-CM | POA: Diagnosis not present

## 2018-07-09 DIAGNOSIS — M5416 Radiculopathy, lumbar region: Secondary | ICD-10-CM | POA: Diagnosis not present

## 2018-07-16 DIAGNOSIS — M545 Low back pain: Secondary | ICD-10-CM | POA: Diagnosis not present

## 2018-07-16 DIAGNOSIS — M2560 Stiffness of unspecified joint, not elsewhere classified: Secondary | ICD-10-CM | POA: Diagnosis not present

## 2018-07-16 DIAGNOSIS — M5416 Radiculopathy, lumbar region: Secondary | ICD-10-CM | POA: Diagnosis not present

## 2018-07-16 DIAGNOSIS — R262 Difficulty in walking, not elsewhere classified: Secondary | ICD-10-CM | POA: Diagnosis not present

## 2018-07-23 DIAGNOSIS — M2560 Stiffness of unspecified joint, not elsewhere classified: Secondary | ICD-10-CM | POA: Diagnosis not present

## 2018-07-23 DIAGNOSIS — R262 Difficulty in walking, not elsewhere classified: Secondary | ICD-10-CM | POA: Diagnosis not present

## 2018-07-23 DIAGNOSIS — M545 Low back pain: Secondary | ICD-10-CM | POA: Diagnosis not present

## 2018-07-23 DIAGNOSIS — M5416 Radiculopathy, lumbar region: Secondary | ICD-10-CM | POA: Diagnosis not present

## 2018-07-29 DIAGNOSIS — M545 Low back pain: Secondary | ICD-10-CM | POA: Diagnosis not present

## 2018-07-29 DIAGNOSIS — M5416 Radiculopathy, lumbar region: Secondary | ICD-10-CM | POA: Diagnosis not present

## 2018-07-29 DIAGNOSIS — M2560 Stiffness of unspecified joint, not elsewhere classified: Secondary | ICD-10-CM | POA: Diagnosis not present

## 2018-07-29 DIAGNOSIS — R262 Difficulty in walking, not elsewhere classified: Secondary | ICD-10-CM | POA: Diagnosis not present

## 2018-08-08 DIAGNOSIS — R262 Difficulty in walking, not elsewhere classified: Secondary | ICD-10-CM | POA: Diagnosis not present

## 2018-08-08 DIAGNOSIS — M545 Low back pain: Secondary | ICD-10-CM | POA: Diagnosis not present

## 2018-08-08 DIAGNOSIS — M2560 Stiffness of unspecified joint, not elsewhere classified: Secondary | ICD-10-CM | POA: Diagnosis not present

## 2018-08-08 DIAGNOSIS — M5416 Radiculopathy, lumbar region: Secondary | ICD-10-CM | POA: Diagnosis not present

## 2018-08-12 DIAGNOSIS — Z6832 Body mass index (BMI) 32.0-32.9, adult: Secondary | ICD-10-CM | POA: Diagnosis not present

## 2018-08-12 DIAGNOSIS — I10 Essential (primary) hypertension: Secondary | ICD-10-CM | POA: Diagnosis not present

## 2018-08-12 DIAGNOSIS — M7138 Other bursal cyst, other site: Secondary | ICD-10-CM | POA: Diagnosis not present

## 2018-08-12 DIAGNOSIS — M4316 Spondylolisthesis, lumbar region: Secondary | ICD-10-CM | POA: Diagnosis not present

## 2018-08-12 DIAGNOSIS — G992 Myelopathy in diseases classified elsewhere: Secondary | ICD-10-CM | POA: Diagnosis not present

## 2018-08-15 ENCOUNTER — Inpatient Hospital Stay: Payer: Medicare Other | Attending: Oncology | Admitting: Oncology

## 2018-08-15 VITALS — BP 101/55 | HR 72 | Temp 98.9°F | Resp 20 | Ht 71.0 in | Wt 232.6 lb

## 2018-08-15 DIAGNOSIS — C211 Malignant neoplasm of anal canal: Secondary | ICD-10-CM | POA: Insufficient documentation

## 2018-08-15 DIAGNOSIS — Z923 Personal history of irradiation: Secondary | ICD-10-CM

## 2018-08-15 DIAGNOSIS — Z952 Presence of prosthetic heart valve: Secondary | ICD-10-CM | POA: Insufficient documentation

## 2018-08-15 DIAGNOSIS — Z951 Presence of aortocoronary bypass graft: Secondary | ICD-10-CM | POA: Diagnosis not present

## 2018-08-15 DIAGNOSIS — N529 Male erectile dysfunction, unspecified: Secondary | ICD-10-CM | POA: Insufficient documentation

## 2018-08-15 DIAGNOSIS — B977 Papillomavirus as the cause of diseases classified elsewhere: Secondary | ICD-10-CM | POA: Insufficient documentation

## 2018-08-15 DIAGNOSIS — G629 Polyneuropathy, unspecified: Secondary | ICD-10-CM | POA: Diagnosis not present

## 2018-08-15 DIAGNOSIS — Z9221 Personal history of antineoplastic chemotherapy: Secondary | ICD-10-CM | POA: Diagnosis not present

## 2018-08-15 DIAGNOSIS — Z79899 Other long term (current) drug therapy: Secondary | ICD-10-CM | POA: Diagnosis not present

## 2018-08-15 NOTE — Progress Notes (Signed)
  [facility] OFFICE PROGRESS NOTE   Diagnosis: Anal cancer  INTERVAL HISTORY:   Kirk [name] returns for a scheduled visit.  He feels well.  He underwent cervical spine surgery in May of this year.  He reports undergoing a prolonged operative recovery.  He has returned to work.  No difficulty with bowel function.  No bleeding. He underwent a colonoscopy 05/02/2018.  There was no evidence of recurrent malignancy.  Multiple polyps were removed from the colon.  The pathology revealed multiple fragments of tubular adenoma.  Objective:  Vital signs in last 24 hours:  Blood pressure (!) 101/55, pulse 72, temperature 98.9 F (37.2 C), temperature source Oral, resp. rate 20, height 5\' 11"  (1.803 m), weight 232 lb 9.6 oz (105.5 kg), SpO2 97 %.    HEENT: Neck without mass Lymphatics: No cervical, supraclavicular, axillary, or inguinal nodes Resp: Lungs clear bilaterally Cardio: Regular rate and rhythm GI: No hepatosplenomegaly, no mass, nontender Rectal: No evidence of recurrent tumor at the anal margin, anal canal and rectum without mass Vascular: No leg edema Skin: Healing ulceration at the medial left buttock  Medications: I have reviewed the patient's current medications.   Assessment/Plan: 1.Squamous cell carcinoma of the anal canal, HPV positive.  2. Staging CT/PET scan with tiny lung nodules, hypermetabolic retroperitoneal, right inguinal, and left supraclavicular nodes . FNA biopsy of a left supraclavicular lymph node on 05/03/2012 confirmed metastatic squamous cell carcinoma.  -Cycle 1 of 5-fluorouracil/mitomycin-C and concurrent radiation on 05/20/2012. Cycle 2 of 5-fluorouracil/mitomycin-C on 06/24/2012 with completion of radiation on 06/28/2012.  -Restaging PET scan 08/02/2012 with resolution of the hypermetabolic anal primary and right inguinal lymph node. Persistent hypermetabolic supraclavicular and retroperitoneal nodes.  -cycle 1 of salvage Taxol/carboplatin  chemotherapy on 08/30/2012 . Restaging CTs after 4 cycles of Taxol/carboplatin confirmed resolution of retroperitoneal lymphadenopathy and a decrease in the size of a previously noted left supraclavicular node. He completed cycle 6 of Taxol/carboplatin on 01/02/2013.  -A negative flexible sigmoidoscopy 04/01/2013  -Staging CT scans 10/09/2013 with stable small left supraclavicular node, no evidence of progressive metastatic disease  -Negative colonoscopy 03/19/2015 -Colonoscopy 05/02/2018-no evidence of recurrent anal cancer, multiple tubular adenomas removed from the colon 3. Hypermetabolic activity at the right supraglottic larynx on the PET scan 04/17/2012 with associated soft tissue fullness status post a negative ENT evaluation by Dr. Constance Holster.  4. History of aortic stenosis-status post aortic valve replacement and coronary artery bypass surgery in November 2012.  5. Mucositis following cycle 1 chemotherapy. Resolved.  6. Skin breakdown at the perineum and penis secondary to chemotherapy and radiation. Resolved.  7. 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 Bone pain after Neulasta  9. Generalized seizure 09/24/2012-negative brain CT,? Etiology. He has been evaluated by neurology and was prescribed Vimpat.  10.history of Thrombocytopenia secondary chemotherapy -improved  11. Peripheral neuropathy-predating chemotherapy  12. Erectile dysfunction-followed by Dr. Risa Grill     Disposition: Mr. Kersh remains in clinical remission from anal cancer.  He will return for an office visit in 1 year.  He will follow-up with Dr.Reade in the interim.  15 minutes were spent with the patient today.  The majority of the time was used for counseling and coordination of care.  Betsy Coder, MD  08/15/2018  8:25 AM

## 2018-09-05 DIAGNOSIS — M5412 Radiculopathy, cervical region: Secondary | ICD-10-CM | POA: Diagnosis not present

## 2018-09-05 DIAGNOSIS — M542 Cervicalgia: Secondary | ICD-10-CM | POA: Diagnosis not present

## 2018-09-05 DIAGNOSIS — M2560 Stiffness of unspecified joint, not elsewhere classified: Secondary | ICD-10-CM | POA: Diagnosis not present

## 2018-09-05 DIAGNOSIS — M25512 Pain in left shoulder: Secondary | ICD-10-CM | POA: Diagnosis not present

## 2018-09-09 DIAGNOSIS — M542 Cervicalgia: Secondary | ICD-10-CM | POA: Diagnosis not present

## 2018-09-09 DIAGNOSIS — M25512 Pain in left shoulder: Secondary | ICD-10-CM | POA: Diagnosis not present

## 2018-09-09 DIAGNOSIS — M2560 Stiffness of unspecified joint, not elsewhere classified: Secondary | ICD-10-CM | POA: Diagnosis not present

## 2018-09-09 DIAGNOSIS — M5412 Radiculopathy, cervical region: Secondary | ICD-10-CM | POA: Diagnosis not present

## 2018-09-11 DIAGNOSIS — M5412 Radiculopathy, cervical region: Secondary | ICD-10-CM | POA: Diagnosis not present

## 2018-09-11 DIAGNOSIS — M542 Cervicalgia: Secondary | ICD-10-CM | POA: Diagnosis not present

## 2018-09-11 DIAGNOSIS — M25512 Pain in left shoulder: Secondary | ICD-10-CM | POA: Diagnosis not present

## 2018-09-11 DIAGNOSIS — M2560 Stiffness of unspecified joint, not elsewhere classified: Secondary | ICD-10-CM | POA: Diagnosis not present

## 2018-09-16 DIAGNOSIS — L820 Inflamed seborrheic keratosis: Secondary | ICD-10-CM | POA: Diagnosis not present

## 2018-09-16 DIAGNOSIS — L821 Other seborrheic keratosis: Secondary | ICD-10-CM | POA: Diagnosis not present

## 2018-09-16 DIAGNOSIS — D229 Melanocytic nevi, unspecified: Secondary | ICD-10-CM | POA: Diagnosis not present

## 2018-09-16 DIAGNOSIS — L080 Pyoderma: Secondary | ICD-10-CM | POA: Diagnosis not present

## 2018-09-16 DIAGNOSIS — L570 Actinic keratosis: Secondary | ICD-10-CM | POA: Diagnosis not present

## 2018-09-16 DIAGNOSIS — L814 Other melanin hyperpigmentation: Secondary | ICD-10-CM | POA: Diagnosis not present

## 2018-09-17 DIAGNOSIS — M2560 Stiffness of unspecified joint, not elsewhere classified: Secondary | ICD-10-CM | POA: Diagnosis not present

## 2018-09-17 DIAGNOSIS — M542 Cervicalgia: Secondary | ICD-10-CM | POA: Diagnosis not present

## 2018-09-17 DIAGNOSIS — M5412 Radiculopathy, cervical region: Secondary | ICD-10-CM | POA: Diagnosis not present

## 2018-09-17 DIAGNOSIS — M25512 Pain in left shoulder: Secondary | ICD-10-CM | POA: Diagnosis not present

## 2018-09-19 DIAGNOSIS — M5412 Radiculopathy, cervical region: Secondary | ICD-10-CM | POA: Diagnosis not present

## 2018-09-19 DIAGNOSIS — M25512 Pain in left shoulder: Secondary | ICD-10-CM | POA: Diagnosis not present

## 2018-09-19 DIAGNOSIS — M542 Cervicalgia: Secondary | ICD-10-CM | POA: Diagnosis not present

## 2018-09-19 DIAGNOSIS — M2560 Stiffness of unspecified joint, not elsewhere classified: Secondary | ICD-10-CM | POA: Diagnosis not present

## 2018-09-24 DIAGNOSIS — M542 Cervicalgia: Secondary | ICD-10-CM | POA: Diagnosis not present

## 2018-09-24 DIAGNOSIS — M25512 Pain in left shoulder: Secondary | ICD-10-CM | POA: Diagnosis not present

## 2018-09-24 DIAGNOSIS — M5412 Radiculopathy, cervical region: Secondary | ICD-10-CM | POA: Diagnosis not present

## 2018-09-24 DIAGNOSIS — M2560 Stiffness of unspecified joint, not elsewhere classified: Secondary | ICD-10-CM | POA: Diagnosis not present

## 2018-09-26 DIAGNOSIS — M2560 Stiffness of unspecified joint, not elsewhere classified: Secondary | ICD-10-CM | POA: Diagnosis not present

## 2018-09-26 DIAGNOSIS — M5412 Radiculopathy, cervical region: Secondary | ICD-10-CM | POA: Diagnosis not present

## 2018-09-26 DIAGNOSIS — M25512 Pain in left shoulder: Secondary | ICD-10-CM | POA: Diagnosis not present

## 2018-09-26 DIAGNOSIS — M542 Cervicalgia: Secondary | ICD-10-CM | POA: Diagnosis not present

## 2018-09-30 DIAGNOSIS — M542 Cervicalgia: Secondary | ICD-10-CM | POA: Diagnosis not present

## 2018-09-30 DIAGNOSIS — M5412 Radiculopathy, cervical region: Secondary | ICD-10-CM | POA: Diagnosis not present

## 2018-09-30 DIAGNOSIS — M2560 Stiffness of unspecified joint, not elsewhere classified: Secondary | ICD-10-CM | POA: Diagnosis not present

## 2018-09-30 DIAGNOSIS — M25512 Pain in left shoulder: Secondary | ICD-10-CM | POA: Diagnosis not present

## 2018-10-03 DIAGNOSIS — M542 Cervicalgia: Secondary | ICD-10-CM | POA: Diagnosis not present

## 2018-10-03 DIAGNOSIS — M5412 Radiculopathy, cervical region: Secondary | ICD-10-CM | POA: Diagnosis not present

## 2018-10-03 DIAGNOSIS — M25512 Pain in left shoulder: Secondary | ICD-10-CM | POA: Diagnosis not present

## 2018-10-03 DIAGNOSIS — M2560 Stiffness of unspecified joint, not elsewhere classified: Secondary | ICD-10-CM | POA: Diagnosis not present

## 2018-10-08 DIAGNOSIS — M2560 Stiffness of unspecified joint, not elsewhere classified: Secondary | ICD-10-CM | POA: Diagnosis not present

## 2018-10-08 DIAGNOSIS — M5412 Radiculopathy, cervical region: Secondary | ICD-10-CM | POA: Diagnosis not present

## 2018-10-08 DIAGNOSIS — M25512 Pain in left shoulder: Secondary | ICD-10-CM | POA: Diagnosis not present

## 2018-10-08 DIAGNOSIS — M542 Cervicalgia: Secondary | ICD-10-CM | POA: Diagnosis not present

## 2018-10-15 DIAGNOSIS — I480 Paroxysmal atrial fibrillation: Secondary | ICD-10-CM | POA: Diagnosis not present

## 2018-10-15 DIAGNOSIS — G4700 Insomnia, unspecified: Secondary | ICD-10-CM | POA: Diagnosis not present

## 2018-10-15 DIAGNOSIS — N183 Chronic kidney disease, stage 3 (moderate): Secondary | ICD-10-CM | POA: Diagnosis not present

## 2018-10-15 DIAGNOSIS — I10 Essential (primary) hypertension: Secondary | ICD-10-CM | POA: Diagnosis not present

## 2018-10-15 DIAGNOSIS — E7800 Pure hypercholesterolemia, unspecified: Secondary | ICD-10-CM | POA: Diagnosis not present

## 2018-10-15 DIAGNOSIS — E291 Testicular hypofunction: Secondary | ICD-10-CM | POA: Diagnosis not present

## 2018-10-15 DIAGNOSIS — Z952 Presence of prosthetic heart valve: Secondary | ICD-10-CM | POA: Diagnosis not present

## 2018-10-15 DIAGNOSIS — Z125 Encounter for screening for malignant neoplasm of prostate: Secondary | ICD-10-CM | POA: Diagnosis not present

## 2018-10-15 DIAGNOSIS — Z1389 Encounter for screening for other disorder: Secondary | ICD-10-CM | POA: Diagnosis not present

## 2018-10-15 DIAGNOSIS — E1140 Type 2 diabetes mellitus with diabetic neuropathy, unspecified: Secondary | ICD-10-CM | POA: Diagnosis not present

## 2018-10-15 DIAGNOSIS — G629 Polyneuropathy, unspecified: Secondary | ICD-10-CM | POA: Diagnosis not present

## 2018-10-15 DIAGNOSIS — I2510 Atherosclerotic heart disease of native coronary artery without angina pectoris: Secondary | ICD-10-CM | POA: Diagnosis not present

## 2019-02-17 DIAGNOSIS — L570 Actinic keratosis: Secondary | ICD-10-CM | POA: Diagnosis not present

## 2019-02-17 DIAGNOSIS — D225 Melanocytic nevi of trunk: Secondary | ICD-10-CM | POA: Diagnosis not present

## 2019-02-17 DIAGNOSIS — L821 Other seborrheic keratosis: Secondary | ICD-10-CM | POA: Diagnosis not present

## 2019-02-17 DIAGNOSIS — B079 Viral wart, unspecified: Secondary | ICD-10-CM | POA: Diagnosis not present

## 2019-02-17 DIAGNOSIS — L814 Other melanin hyperpigmentation: Secondary | ICD-10-CM | POA: Diagnosis not present

## 2019-02-17 DIAGNOSIS — D1801 Hemangioma of skin and subcutaneous tissue: Secondary | ICD-10-CM | POA: Diagnosis not present

## 2019-03-28 DIAGNOSIS — M1711 Unilateral primary osteoarthritis, right knee: Secondary | ICD-10-CM | POA: Diagnosis not present

## 2019-03-28 DIAGNOSIS — M1712 Unilateral primary osteoarthritis, left knee: Secondary | ICD-10-CM | POA: Diagnosis not present

## 2019-03-29 DIAGNOSIS — M1711 Unilateral primary osteoarthritis, right knee: Secondary | ICD-10-CM | POA: Diagnosis not present

## 2019-03-29 DIAGNOSIS — M1712 Unilateral primary osteoarthritis, left knee: Secondary | ICD-10-CM | POA: Diagnosis not present

## 2019-03-31 ENCOUNTER — Encounter: Payer: Self-pay | Admitting: Gastroenterology

## 2019-04-21 DIAGNOSIS — R0989 Other specified symptoms and signs involving the circulatory and respiratory systems: Secondary | ICD-10-CM | POA: Diagnosis not present

## 2019-04-21 DIAGNOSIS — N5201 Erectile dysfunction due to arterial insufficiency: Secondary | ICD-10-CM | POA: Diagnosis not present

## 2019-04-21 DIAGNOSIS — G629 Polyneuropathy, unspecified: Secondary | ICD-10-CM | POA: Diagnosis not present

## 2019-04-21 DIAGNOSIS — Z125 Encounter for screening for malignant neoplasm of prostate: Secondary | ICD-10-CM | POA: Diagnosis not present

## 2019-04-21 DIAGNOSIS — E7800 Pure hypercholesterolemia, unspecified: Secondary | ICD-10-CM | POA: Diagnosis not present

## 2019-04-21 DIAGNOSIS — Z1389 Encounter for screening for other disorder: Secondary | ICD-10-CM | POA: Diagnosis not present

## 2019-04-21 DIAGNOSIS — N183 Chronic kidney disease, stage 3 (moderate): Secondary | ICD-10-CM | POA: Diagnosis not present

## 2019-04-21 DIAGNOSIS — I2510 Atherosclerotic heart disease of native coronary artery without angina pectoris: Secondary | ICD-10-CM | POA: Diagnosis not present

## 2019-04-21 DIAGNOSIS — R351 Nocturia: Secondary | ICD-10-CM | POA: Diagnosis not present

## 2019-04-21 DIAGNOSIS — I10 Essential (primary) hypertension: Secondary | ICD-10-CM | POA: Diagnosis not present

## 2019-04-21 DIAGNOSIS — E1142 Type 2 diabetes mellitus with diabetic polyneuropathy: Secondary | ICD-10-CM | POA: Diagnosis not present

## 2019-04-21 DIAGNOSIS — Z85048 Personal history of other malignant neoplasm of rectum, rectosigmoid junction, and anus: Secondary | ICD-10-CM | POA: Diagnosis not present

## 2019-04-21 DIAGNOSIS — Z0000 Encounter for general adult medical examination without abnormal findings: Secondary | ICD-10-CM | POA: Diagnosis not present

## 2019-04-21 DIAGNOSIS — Z6833 Body mass index (BMI) 33.0-33.9, adult: Secondary | ICD-10-CM | POA: Diagnosis not present

## 2019-04-21 DIAGNOSIS — E291 Testicular hypofunction: Secondary | ICD-10-CM | POA: Diagnosis not present

## 2019-04-24 DIAGNOSIS — M170 Bilateral primary osteoarthritis of knee: Secondary | ICD-10-CM | POA: Diagnosis not present

## 2019-05-01 DIAGNOSIS — M170 Bilateral primary osteoarthritis of knee: Secondary | ICD-10-CM | POA: Diagnosis not present

## 2019-05-06 ENCOUNTER — Ambulatory Visit: Payer: Medicare Other | Admitting: Cardiology

## 2019-05-08 DIAGNOSIS — M170 Bilateral primary osteoarthritis of knee: Secondary | ICD-10-CM | POA: Diagnosis not present

## 2019-05-16 ENCOUNTER — Other Ambulatory Visit: Payer: Self-pay | Admitting: Cardiology

## 2019-05-25 IMAGING — MR MR THORACIC SPINE WO/W CM
4 of 7 series · 25 of 48 positions shown · IV contrast (MH)
Comparison: Chest CT 10/09/2013

CLINICAL DATA: History of metastatic squamous cell carcinoma. Back
pain with lower extremity weakness.

EXAM:
MRI THORACIC AND LUMBAR SPINE WITHOUT AND WITH CONTRAST
TECHNIQUE: Multiplanar and multiecho pulse sequences of the thoracic and lumbar
spine were obtained without and with intravenous contrast.
CONTRAST:  20mL MULTIHANCE GADOBENATE DIMEGLUMINE 529 MG/ML IV SOLN

[Series 1001: T2 · sagittal · 4.0mm · 0.73mm/px · 6 of 17 slices shown (1 of 2)]
[im 1/17]
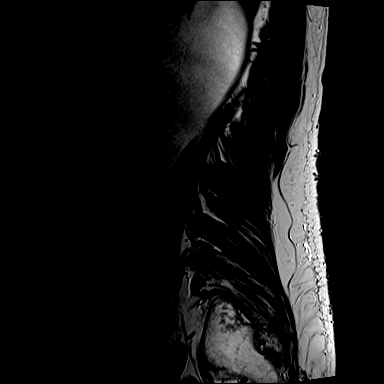
[im 4/17]
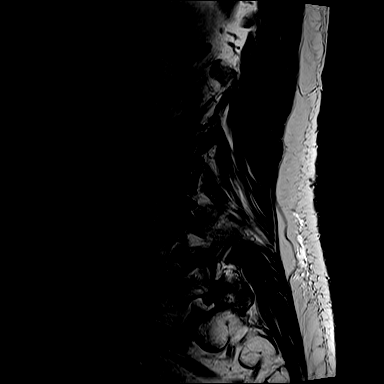
[im 7/17]
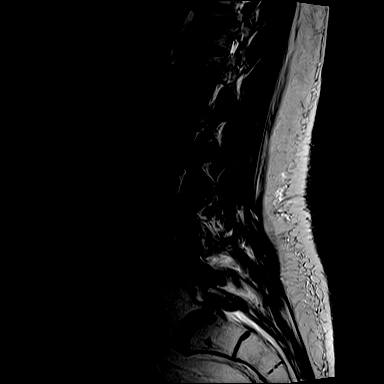
[im 10/17]
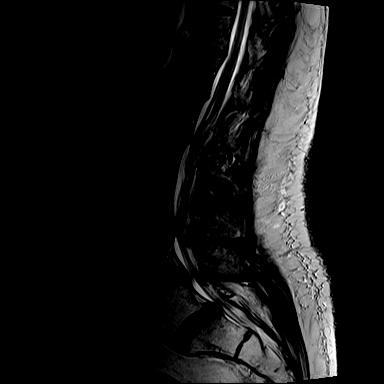
[im 13/17]
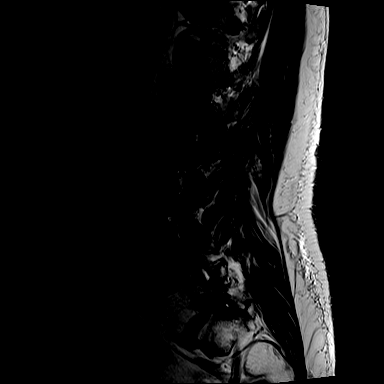
[im 17/17]
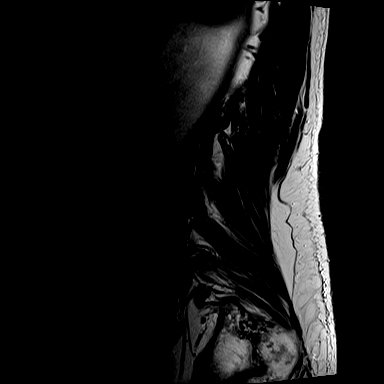

[Series 3001: T1 · sagittal · 4.0mm · 0.88mm/px · 6 of 17 slices shown (1 of 2)]
[im 1/17]
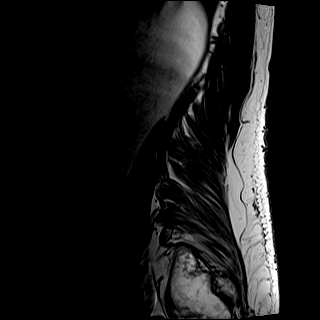
[im 4/17]
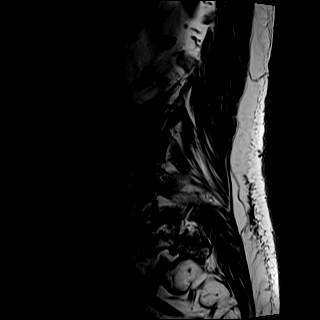
[im 7/17]
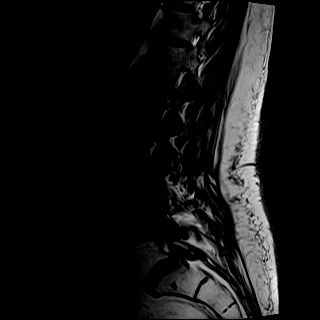
[im 10/17]
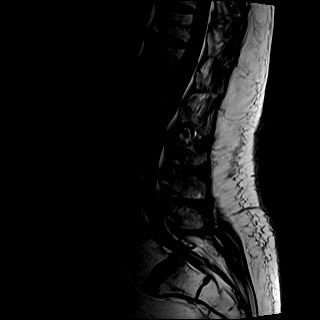
[im 13/17]
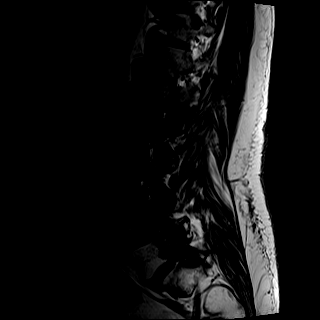
[im 17/17]
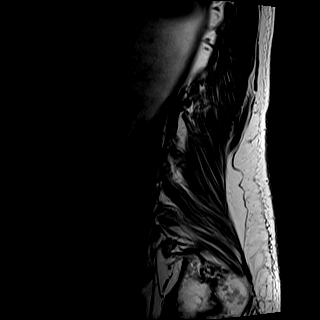

[Series 4001: T2 · axial · 5.0mm · 0.57mm/px · z∈[-428,-235]mm · 8 of 24 slices shown (2 of 2)]
[im 1/24]
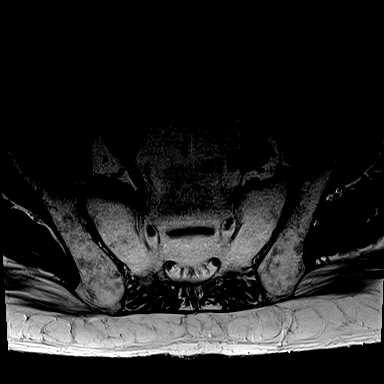
[im 4/24]
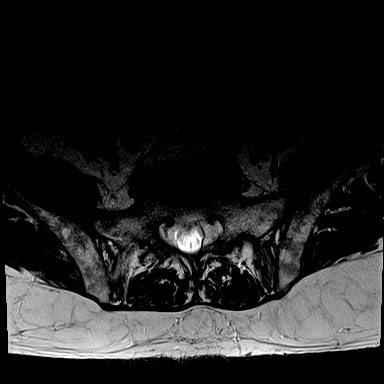
[im 7/24]
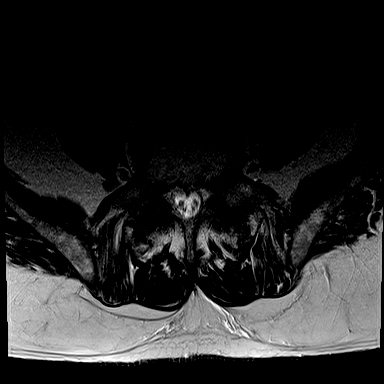
[im 10/24]
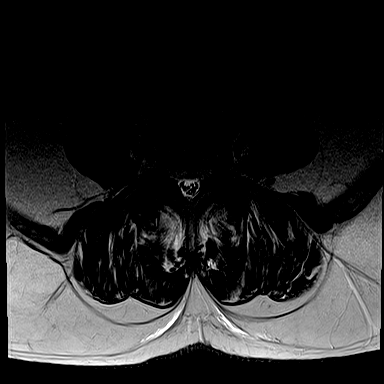
[im 14/24]
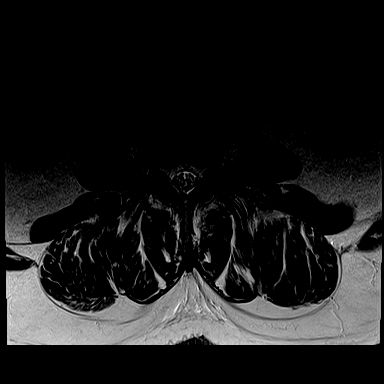
[im 17/24]
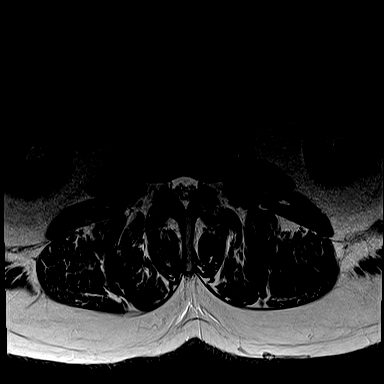
[im 20/24]
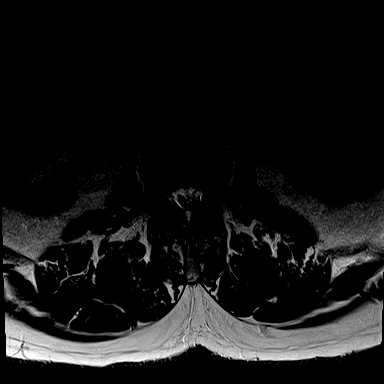
[im 24/24]
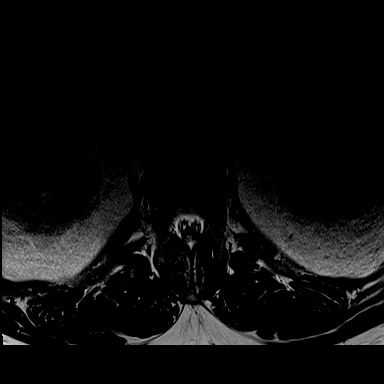

[Series 5001: T1 · axial · 5.0mm · 0.34mm/px · z∈[-428,-265]mm · 5 of 24 slices shown (2 of 2)]
[im 1/24]
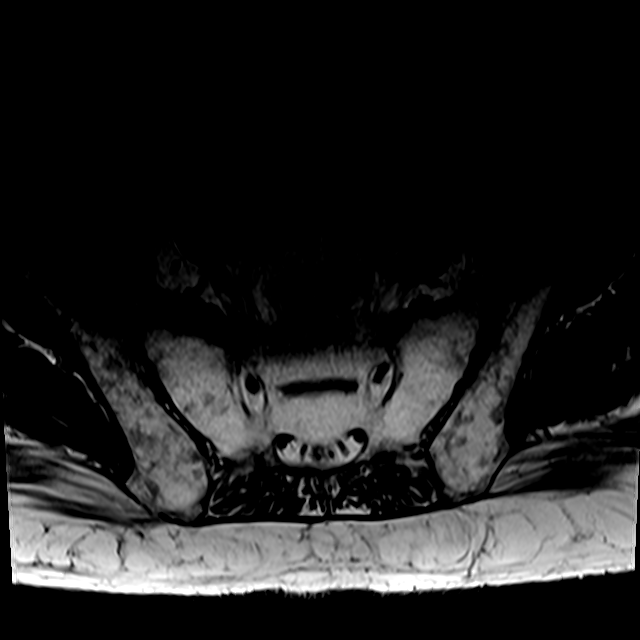
[im 4/24]
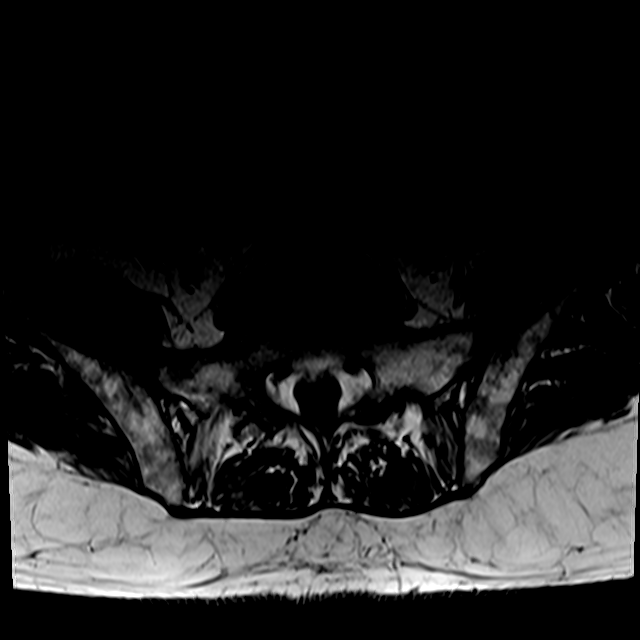
[im 7/24]
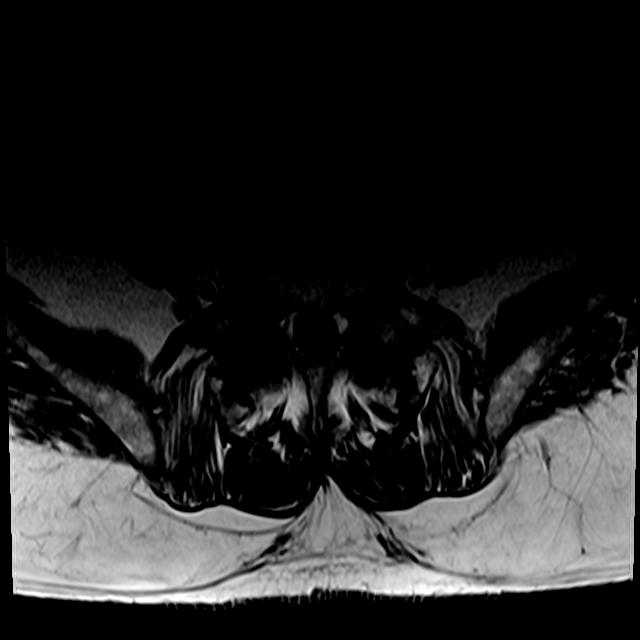
[im 14/24]
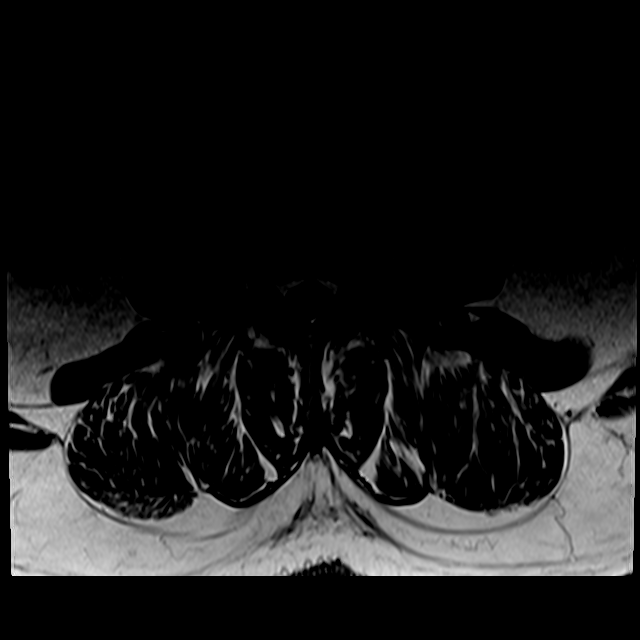
[im 20/24]
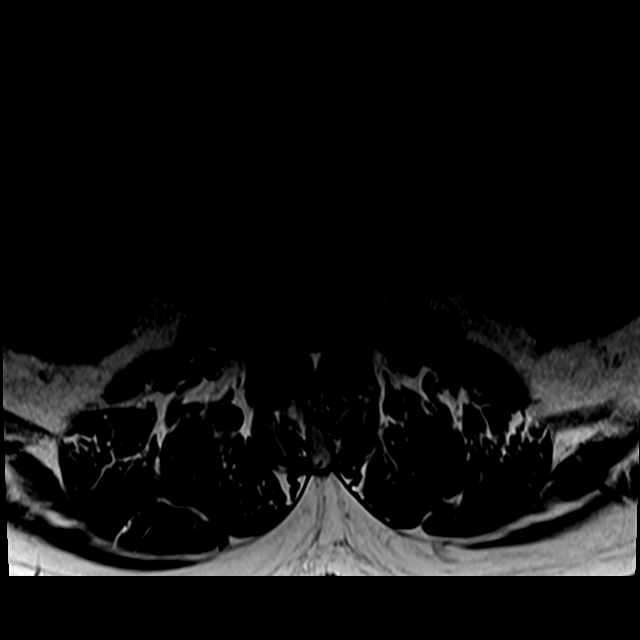

[25 of 48 positions shown; findings below may reference images not displayed]

FINDINGS: MRI THORACIC SPINE FINDINGS

Alignment:  Physiologic.

Vertebrae: No fracture, evidence of discitis, or bone lesion.

Cord:  Normal signal and morphology.

Paraspinal and other soft tissues: Negative.

Disc levels:

Mild multilevel disc degeneration, not unexpected for age. No spinal
canal stenosis.

MRI LUMBAR SPINE FINDINGS

Segmentation:  Standard.

Alignment:  Grade 1 L4-L5 and L5-S1 anterolisthesis.

Vertebrae:  No fracture, evidence of discitis, or bone lesion.

Conus medullaris: Extends to the L1 level and appears normal.

Paraspinal and other soft tissues: Negative.

Disc levels:

L1-L2: There is a synovial cyst arising from the medial aspect of
the right facet joint, measuring 10 x 6 x 8 mm. This displaces the
right-sided nerve roots. The central spinal canal remains patent.
There is moderate right neural foraminal stenosis.

L2-L3: No stenosis.

L3-L4: Moderate facet hypertrophy and medium-sized disc bulge. There
is a small medially projecting synovial cyst that measures
approximately 6 x 5 mm. This is visible only on the sagittal
sequence and is near the midline. There is moderate central spinal
canal and bilateral neural foraminal stenosis.

L4-L5: Grade 1 anterolisthesis. There is severe bilateral facet
hypertrophy with fluid in both facet joints. There is severe
bilateral neural foraminal stenosis and mild central spinal canal
stenosis. Diffuse disc bulge.

L5-S1: Grade 1 anterolisthesis secondary to severe facet
hypertrophy. No spinal canal stenosis. Severe bilateral neural
foraminal stenosis.
IMPRESSION: 1. No osseous metastatic disease of the thoracic or lumbar spine.
2. Grade 1 anterolisthesis at L4-L5 and L5-S1 secondary to severe
bilateral facet arthrosis.
3. Medially projecting synovial cysts at the L1-2 and L3-4 levels
that may contribute to irritation of the descending nerve roots.
4. Severe bilateral L4-5 and L5-S1 neural foraminal stenosis.

## 2019-05-26 ENCOUNTER — Encounter: Payer: Medicare Other | Admitting: Gastroenterology

## 2019-06-02 DIAGNOSIS — R6889 Other general symptoms and signs: Secondary | ICD-10-CM | POA: Diagnosis not present

## 2019-06-03 ENCOUNTER — Encounter: Payer: Self-pay | Admitting: Gastroenterology

## 2019-06-03 DIAGNOSIS — R6889 Other general symptoms and signs: Secondary | ICD-10-CM | POA: Diagnosis not present

## 2019-06-08 IMAGING — CR DG CERVICAL SPINE 1V
1 series · 1 of 1 positions shown · non-contrast
Comparison: Portable cross-table lateral intraoperative image at
5105 hours compared to 01/17/2018

CLINICAL DATA: C6-C7 ACDF

EXAM:
DG CERVICAL SPINE - 1 VIEW

[AP]
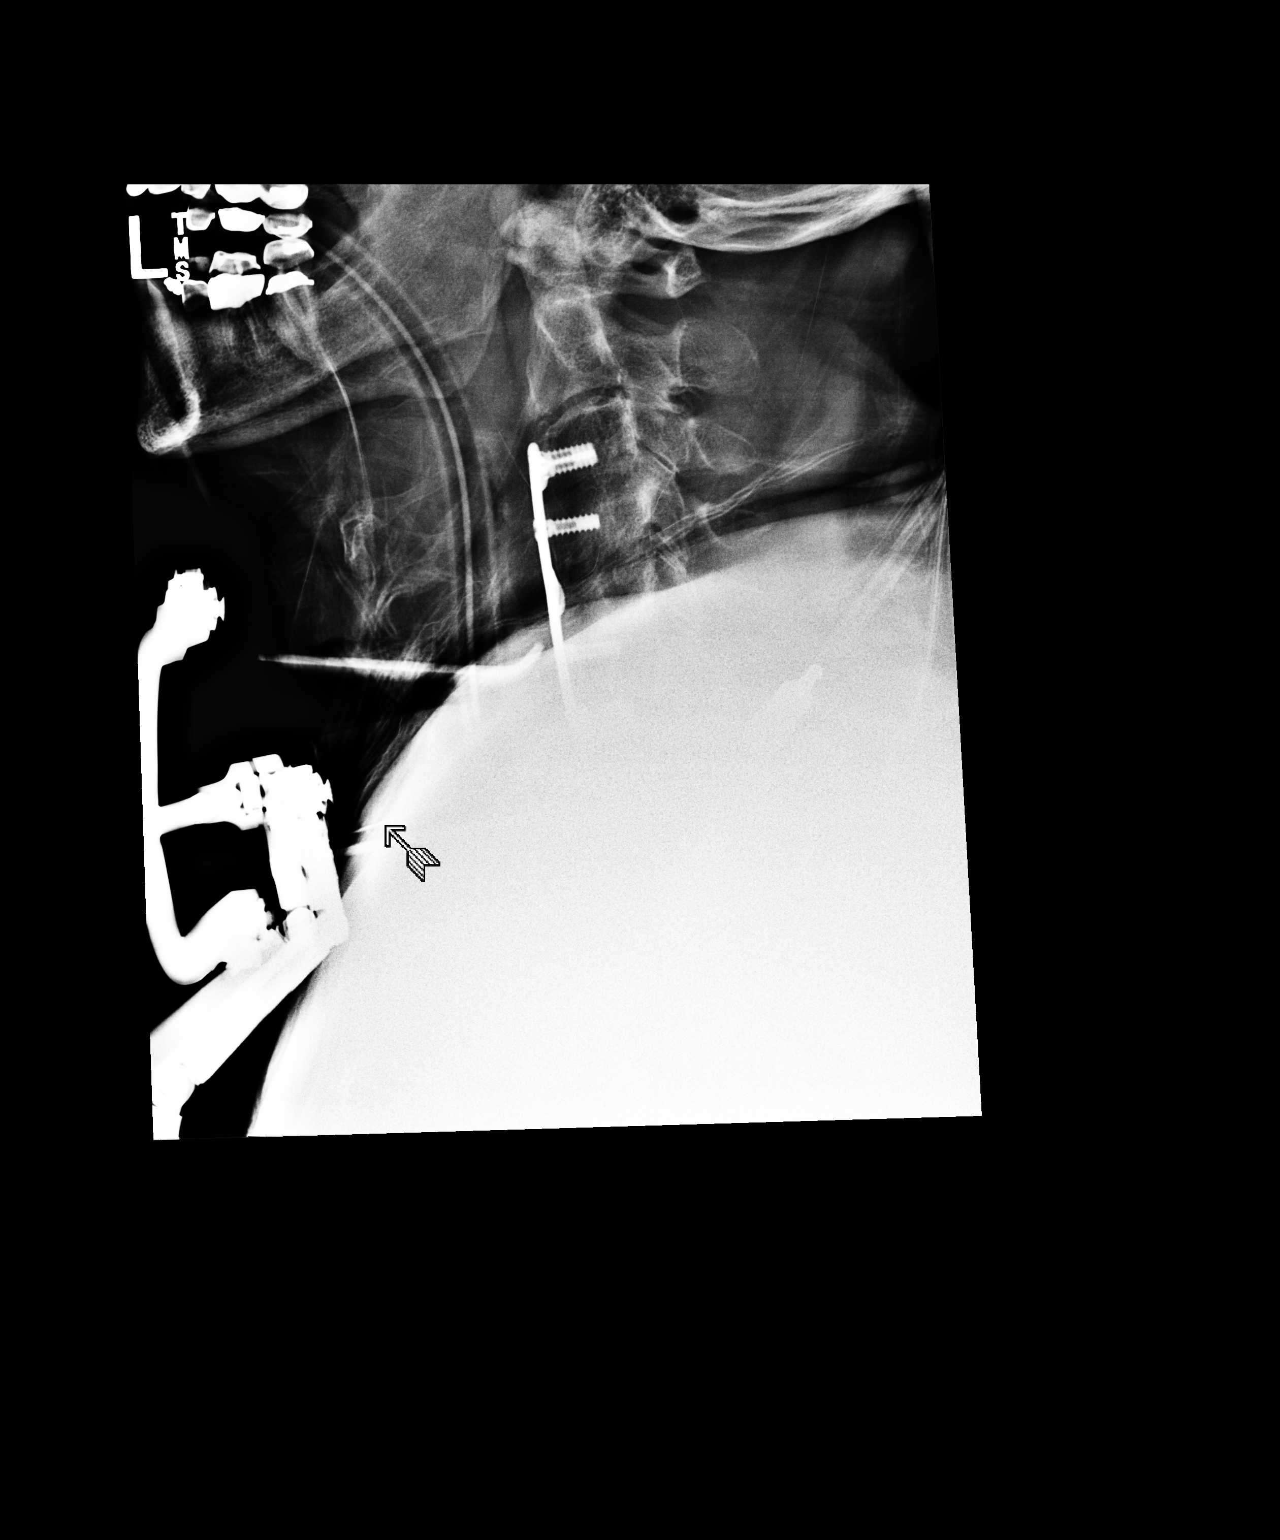

[1 of 1 positions shown; findings below may reference images not displayed]

FINDINGS: Bones demineralized.

Anterior plate and screws are present at C3-C6.

The inferior most screws are present at the inferior C6 vertebral
body and at the inferior endplate of C5 into the C5-C6 disc space.

Metallic probe via anterior approach projects anterior to the
inferior endplate of C5 and the C5-C6 disc space.
IMPRESSION: Anterior localization of the level of the inferior endplate of C5
and the C5-C6 disc space.

## 2019-06-10 DIAGNOSIS — Z4802 Encounter for removal of sutures: Secondary | ICD-10-CM | POA: Diagnosis not present

## 2019-06-16 ENCOUNTER — Ambulatory Visit (AMBULATORY_SURGERY_CENTER): Payer: Self-pay | Admitting: *Deleted

## 2019-06-16 ENCOUNTER — Other Ambulatory Visit: Payer: Self-pay

## 2019-06-16 VITALS — Temp 97.8°F | Ht 71.0 in | Wt 236.6 lb

## 2019-06-16 DIAGNOSIS — Z86010 Personal history of colonic polyps: Secondary | ICD-10-CM

## 2019-06-16 DIAGNOSIS — Z85048 Personal history of other malignant neoplasm of rectum, rectosigmoid junction, and anus: Secondary | ICD-10-CM

## 2019-06-16 MED ORDER — GOLYTELY 236 G PO SOLR: 4000.0000 mL | Freq: Once | ORAL | 0 refills | Status: AC

## 2019-06-16 NOTE — Progress Notes (Signed)
Patient denies any allergies to egg or soy products. Patient denies complications with anesthesia/sedation.  Patient denies oxygen use at home and denies diet medications. Emmi instructions for colonoscopy/endoscopy explained and given to patient.   

## 2019-06-17 DIAGNOSIS — Z23 Encounter for immunization: Secondary | ICD-10-CM | POA: Diagnosis not present

## 2019-06-18 ENCOUNTER — Ambulatory Visit (INDEPENDENT_AMBULATORY_CARE_PROVIDER_SITE_OTHER): Payer: Medicare Other | Admitting: Cardiology

## 2019-06-18 ENCOUNTER — Encounter: Payer: Self-pay | Admitting: Cardiology

## 2019-06-18 ENCOUNTER — Other Ambulatory Visit: Payer: Self-pay

## 2019-06-18 VITALS — BP 130/74 | HR 78 | Ht 71.0 in | Wt 235.8 lb

## 2019-06-18 DIAGNOSIS — I2583 Coronary atherosclerosis due to lipid rich plaque: Secondary | ICD-10-CM

## 2019-06-18 DIAGNOSIS — Z951 Presence of aortocoronary bypass graft: Secondary | ICD-10-CM

## 2019-06-18 DIAGNOSIS — I2510 Atherosclerotic heart disease of native coronary artery without angina pectoris: Secondary | ICD-10-CM

## 2019-06-18 DIAGNOSIS — Z952 Presence of prosthetic heart valve: Secondary | ICD-10-CM | POA: Diagnosis not present

## 2019-06-18 NOTE — Patient Instructions (Signed)
Medication Instructions:  No changes If you need a refill on your cardiac medications before your next appointment, please call your pharmacy.   Lab work: none If you have labs (blood work) drawn today and your tests are completely normal, you will receive your results only by: . MyChart Message (if you have MyChart) OR . A paper copy in the mail If you have any lab test that is abnormal or we need to change your treatment, we will call you to review the results.  Testing/Procedures: none  Follow-Up: At [facility], you and your health needs are our priority.  As part of our continuing mission to provide you with exceptional heart care, we have created designated Provider Care Teams.  These Care Teams include your primary Cardiologist (physician) and Advanced Practice Providers (APPs -  Physician Assistants and Nurse Practitioners) who all work together to provide you with the care you need, when you need it. You will need a follow up appointment in 12 months.  Please call our office 2 months in advance to schedule this appointment.  You may see Mark Skains, MD or one of the following Advanced Practice Providers on your designated Care Team:   Lori Gerhardt, NP Laura Ingold, NP . Jill McDaniel, NP  Any Other Special Instructions Will Be Listed Below (If Applicable).    

## 2019-06-18 NOTE — Progress Notes (Signed)
Kirk [name]. 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 Also has a supraclavicular and peri-abdominal aortic lymph node. Seen oncology at [facility] and had chemo/rad.   He is doing well from a cardiac standpoint. No shortness of breath. Cancer is in remission.   No changes in medications. LDL cholesterol 78 on 12/14, triglycerides 220 range. Trying to lose a little bit of weight. Has been up and down.  Shingles. Amotivational for a while. Grapey. Injection for ED.  Kirk [name] is his wife. Was recently at [facility],  [name], engineering. No anginal symptoms.  He has been battling a cold. Overall doing well. His wife unfortunately lost her mother. Father is 14 years old.  04/30/18 - Was out of work with neck, was down to 220. Dr. Vertell [name].  Overall not having any fevers chills nausea vomiting syncope bleeding orthopnea.  Slowly getting back into shape.  Back in the pain.  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  06/18/19 -here for follow-up of aortic valve replacement.  He is doing great.  Feels great.  Taking his medications.  Has gained a little bit of weight during Covid.  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  Back surgery 01/2019 - Dr. Vertell [name]. 7 months.  Denies any fever chills nausea vomiting syncope bleeding.  Wt Readings from Last 3 Encounters:  06/18/19 235 lb 12.8 oz (107 kg)  06/16/19 236 lb 9.6 oz (107.3 kg)  08/15/18 232 lb 9.6 oz (105.5 kg)     Past Medical History:  Diagnosis Date  . Allergy   . Angina    6 wks. ago  . Aortic stenosis    valve replacement  . Arthritis    1995- cerv.  fusion- [facility]  . Atrial fibrillation ([place]) 08/20/2011   Post op heart surgery, no problems since, no meds  . Blood in stool   . Cancer ([place]) 04/09/12   Rectum bx=invasive squamous cell carcinoma  . CHF (congestive heart failure) ([place])   . Coronary artery disease    aortic stenosis, CAD  . Diabetes mellitus    Type 2  . ED (erectile dysfunction)   . ED (erectile dysfunction)   . Elevated cholesterol   . Heart murmur    no problems  . History of kidney stones    passed, lithrotrispy and surgery  . History of radiation therapy 05/20/12-06/28/12   anal cancer=54gy total dose  . Hypertension   . Kidney stones   . 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 Malignant neoplasm of other sites of rectum, rectosigmoid junction, and anus 04/12/2012  . Myocardial infarction ([place]) 2012  . Personal history of colonic polyps 03/20/2012  . Rectal bleeding   . Rectal mass 03/20/2012  . 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   #47mm [facility] Ease pericardial tissue valve   . 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 Seizure disorder, grand mal ([place])    only 1  . Seizures ([place])    after taking Oxycodone; not prescribed  seizure med  . Shortness of breath   . Thrombosed hemorrhoids     Past Surgical History:  Procedure Laterality Date  . ANAL FISSURE REPAIR     patient unsure of date  . ANTERIOR CERVICAL DECOMP/DISCECTOMY FUSION N/A 01/22/2018   Procedure: ANTERIOR CERVICAL DECOMPRESSION/DISCECTOMY FUSION CERVICAL SIX- CERVICAL SEVEN;  Surgeon: Kirk Levine, MD;  Location: [place];  Service: Neurosurgery;  Laterality: N/A;  ANTERIOR CERVICAL DECOMPRESSION/DISCECTOMY FUSION CERVICAL SIX- CERVICAL SEVEN  . AORTIC VALVE REPLACEMENT  08/11/2011   Procedure: AORTIC VALVE REPLACEMENT (AVR);  Surgeon: Rexene Alberts, MD;  Location: [place];  Service: Open Heart Surgery;  Laterality: N/A;  . [place]  . COLONOSCOPY     hx polyps  . CORONARY ARTERY BYPASS  GRAFT  08/16/2011   Procedure: CORONARY ARTERY BYPASS GRAFTING (CABG);  Surgeon: Rexene Alberts, MD;  Location: [place];  Service: Open Heart Surgery;  Laterality: N/A;  CABG times one using left internal mammary artery  . LITHOTRIPSY  2001 AND 2002   DR PETERSON  . POLYPECTOMY     colon    Current Outpatient Medications  Medication Sig Dispense Refill  . amitriptyline (ELAVIL) 25 MG tablet Take 25 mg by mouth at bedtime as needed for sleep.     Marland Kitchen aspirin EC 81 MG tablet Take 81 mg by mouth daily.    . Cholecalciferol 1000 UNITS tablet 2,000 Units. Take 185 mg by mouth daily.    . DULoxetine (CYMBALTA) 60 MG capsule Take 60 mg by mouth daily.    Marland Kitchen LIPITOR 80 MG tablet Take 1 tablet (80 mg total) by mouth daily. Please keep upcoming appt with Dr. Marlou Porch in October for future refills. Thank you 90 tablet 0  . losartan (COZAAR) 50 MG tablet Take 1 tablet (50 mg total) by mouth daily. 90 tablet 3  . metoprolol tartrate (LOPRESSOR) 25 MG tablet TAKE 1 TABLET BY MOUTH TWICE A DAY 180 tablet 3  . MYRBETRIQ 25 MG TB24 tablet Take 25 mg by mouth at bedtime.     Marland Kitchen OVER THE COUNTER MEDICATION Take 1 tablet by mouth daily. Z-Bec Zinc. vitamin B, E And C    . saxagliptin HCl (ONGLYZA) 5 MG TABS tablet Take 5 mg by mouth daily.    . Tetrahydrozoline HCl (VISINE OP) Place 1 drop into both eyes as needed. Visine S    . Vitamins-Lipotropics (LIPO-FLAVONOID PLUS) TABS Take 1 tablet by mouth 3 (three) times daily after meals.       No current facility-administered medications for this visit.     Allergies:    Allergies  Allergen Reactions  . Cat Hair Extract Shortness Of Breath and Swelling  . Atorvastatin Other (See Comments)    Pt has muscle ache on Atorvastatin but IS ABLE TO TAKE LIPITOR without any issues  . Dust Mite Extract Other (See Comments) and Swelling  . Metformin Other (See Comments)    Diarrhea, muscle cramps  . Oxycodone Other (See Comments)    Seizure PATIENT DOES NOT WANT ANY  NARCOTICS    Social History:  The patient  reports that he has never smoked. He has never used smokeless tobacco. He reports previous alcohol use. He reports that he does not use drugs.  Kirk [name] is his wife. [name].  ROS:  Please see the history of present illness.     PHYSICAL EXAM: VS:  BP 130/74   Pulse 78   Ht 5\' 11"  (1.803 m)  Wt 235 lb 12.8 oz (107 kg)   SpO2 95%   BMI 32.89 kg/m  GEN: Well nourished, well developed, in no acute distress  HEENT: normal  Neck: no JVD, carotid bruits, or masses Cardiac: RRR; 2/6 SM, no rubs, or gallops,no edema  Respiratory:  clear to auscultation bilaterally, normal work of breathing GI: soft, nontender, nondistended, + BS MS: no deformity or atrophy  Skin: warm and dry, no rash Neuro:  Alert and Oriented x 3, Strength and sensation are intact Psych: euthymic mood, full affect    EKG:    Today: 06/18/19 - NSR 79. 04/30/2018-sinus rhythm 71 poor R wave progression nonspecific T wave changes no other abnormal  04/30/17-sinus bradycardia rate 53 with no other abnormalities. 02/17/16 - SB 51 no changes. 02/04/15-sinus or bradycardia rate 56 with sinus arrhythmia, otherwise normal. 03/03/14-sinus bradycardia rate 55, incomplete left bundle branch block     ECHO 03/14/16: - Left ventricle: The cavity size was normal. There was moderate   focal basal and mild concentric hypertrophy. Systolic function   was normal. The estimated ejection fraction was in the range of   60% to 65%. Wall motion was normal; there were no regional wall   motion abnormalities. Features are consistent with a pseudonormal   left ventricular filling pattern, with concomitant abnormal   relaxation and increased filling pressure (grade 2 diastolic   dysfunction). Doppler parameters are consistent with high   ventricular filling pressure. - Aortic valve: A bioprosthesis was present. There was mild to   moderate stenosis. - Aorta: Aortic root dimension: 38 mm (ED). - Aortic  root: The aortic root was mildly dilated. - Mitral valve: Severely calcified annulus. There was trivial   regurgitation.  ASSESSMENT AND PLAN:  1. Coronary artery disease-status post bypass.  December 2012.  Doing very well, no anginal symptoms. Bypass was done in the setting of aortic valve replacement.  Reassuring.  No changes made. 2. Aortic valve replacement-bioprosthetic-doing very well. Soft murmur heard.  Dental antibiotics, echocardiogram reviewed once again 2017.  Overall doing well.  Everyone doing fine 3. ED - Grapey, injection.  No new issues. 4. Hyperlipidemia-excellent lipid results however triglycerides are mildly elevated. Weight loss, diet. Continue with statin therapy. Continue with current therapy.  LDL 113, triglycerides 123.  Excellent. 5. Obesity-continue with weight loss.  Continue with encouragement. 6. 1 year follow-up   Signed, [name], MD [facility]  06/19/2019 1:00 PM

## 2019-06-20 ENCOUNTER — Telehealth: Payer: Self-pay | Admitting: Gastroenterology

## 2019-06-20 DIAGNOSIS — Z86010 Personal history of colonic polyps: Secondary | ICD-10-CM

## 2019-06-20 MED ORDER — PEG 3350-KCL-NA BICARB-NACL 420 G PO SOLR
4000.0000 mL | Freq: Once | ORAL | 0 refills | Status: AC
Start: 2019-06-20 — End: 2019-06-20

## 2019-06-20 NOTE — Telephone Encounter (Signed)
Resent Golytely prep to [facility] per request. Patient's wife aware.

## 2019-06-20 NOTE — Telephone Encounter (Signed)
Pt's wife called requesting prescription for prep to be sent to [facility] on [address].

## 2019-06-30 ENCOUNTER — Ambulatory Visit (AMBULATORY_SURGERY_CENTER): Payer: Medicare Other | Admitting: Gastroenterology

## 2019-06-30 ENCOUNTER — Other Ambulatory Visit: Payer: Self-pay

## 2019-06-30 ENCOUNTER — Encounter: Payer: Self-pay | Admitting: Gastroenterology

## 2019-06-30 VITALS — BP 145/94 | HR 79 | Temp 99.1°F | Resp 15 | Ht 71.0 in | Wt 236.6 lb

## 2019-06-30 DIAGNOSIS — D122 Benign neoplasm of ascending colon: Secondary | ICD-10-CM

## 2019-06-30 DIAGNOSIS — D123 Benign neoplasm of transverse colon: Secondary | ICD-10-CM

## 2019-06-30 DIAGNOSIS — Z85048 Personal history of other malignant neoplasm of rectum, rectosigmoid junction, and anus: Secondary | ICD-10-CM

## 2019-06-30 DIAGNOSIS — Z86010 Personal history of colonic polyps: Secondary | ICD-10-CM

## 2019-06-30 MED ORDER — SODIUM CHLORIDE 0.9 % IV SOLN: 500.0000 mL | Freq: Once | INTRAVENOUS | Status: DC

## 2019-06-30 NOTE — Progress Notes (Signed)
Pt. Reports no change in his medical or surgical history since his pre-visit 06/16/2019.

## 2019-06-30 NOTE — Progress Notes (Signed)
Called to room to assist during endoscopic procedure.  Patient ID and intended procedure confirmed with present staff. Received instructions for my participation in the procedure from the performing physician.  

## 2019-06-30 NOTE — Op Note (Signed)
[place] Patient Name: Kirk [name] Date: 06/30/2019 3:08 PM MRN: [id number] Endoscopist: Remo Lipps P. Havery Moros , MD Age: 73 Referring MD:  Date of Birth: 06/10/1946 Gender: Male Account #: [id number] [name]:                Colonoscopy Indications:              High risk colon cancer surveillance: Personal                            history of colonic polyps ( 10 adenomas one year                            ago), history of rectal cancer s/p radiation Medicines:                Monitored Anesthesia Care [name]:                Pre-Anesthesia Assessment:                           - Prior to the [name], a History and Physical                            was performed, and patient medications and                            allergies were reviewed. The patient's tolerance of                            previous anesthesia was also reviewed. The risks                            and benefits of the [name] and the sedation                            options and risks were discussed with the patient.                            All questions were answered, and informed consent                            was obtained. Prior Anticoagulants: The patient has                            taken no previous anticoagulant or antiplatelet                            agents. ASA Grade Assessment: III - A patient with                            severe systemic disease. After reviewing the risks                            and benefits, the patient was deemed in  satisfactory condition to undergo the [name].                           After obtaining informed consent, the colonoscope                            was passed under direct vision. Throughout the                            [name], the patient's blood pressure, pulse, and                            oxygen saturations were monitored continuously. The                            Colonoscope  was introduced through the anus and                            advanced to the the cecum, identified by                            appendiceal orifice and ileocecal valve. The                            colonoscopy was performed without difficulty. The                            patient tolerated the [name] well. The quality                            of the bowel preparation was adequate. The                            ileocecal valve, appendiceal orifice, and rectum                            were photographed. Scope In: 3:23:48 PM Scope Out: 3:51:54 PM Scope Withdrawal Time: 0 hours 18 minutes 33 seconds  Total [name] Duration: 0 hours 28 minutes 6 seconds  Findings:                 The perianal and digital rectal examinations were                            normal.                           Two sessile polyps were found in the ascending                            colon. The polyps were 2 to 4 mm in size. These                            polyps were removed with a cold snare. Resection  and retrieval were complete.                           Two sessile polyps were found in the hepatic                            flexure. The polyps were 3 to 4 mm in size. These                            polyps were removed with a cold snare. Resection                            and retrieval were complete.                           Two sessile polyps were found in the transverse                            colon. The polyps were 3 to 4 mm in size. These                            polyps were removed with a cold snare. Resection                            and retrieval were complete.                           A 3 mm polyp was found in the splenic flexure. The                            polyp was sessile. The polyp was removed with a                            cold snare. Resection and retrieval were complete.                           Multiple small-mouthed diverticula  were found in                            the sigmoid colon.                           The colon was significantly looped and cecal                            intubation was challenging. The rectal vault was                            small and atrophic, no retroflexed views obtained.                            There was some slightly irritated / friable distal  rectal / perianal tissue noted, suspect from prep /                            radiation. The exam was otherwise without                            abnormality. Complications:            No immediate complications. Estimated blood loss:                            Minimal. Estimated Blood Loss:     Estimated blood loss was minimal. Impression:               - Two 2 to 4 mm polyps in the ascending colon,                            removed with a cold snare. Resected and retrieved.                           - Two 3 to 4 mm polyps at the hepatic flexure,                            removed with a cold snare. Resected and retrieved.                           - Two 3 to 4 mm polyps in the transverse colon,                            removed with a cold snare. Resected and retrieved.                           - One 3 mm polyp at the splenic flexure, removed                            with a cold snare. Resected and retrieved.                           - Diverticulosis in the sigmoid colon.                           - Atrophic rectum / small rectal vault from prior                            radiation.                           - Significant looping.                           - The examination was otherwise normal. Recommendation:           - Patient has a contact number available for  emergencies. The signs and symptoms of potential                            delayed complications were discussed with the                            patient. Return to normal activities tomorrow.                             Written discharge instructions were provided to the                            patient.                           - Resume previous diet.                           - Continue present medications.                           - Await pathology results.                           - Anticipate repeat colonoscopy in 3 years. Remo Lipps P. Onofrio Klemp, MD 06/30/2019 4:00:23 PM This report has been signed electronically.

## 2019-06-30 NOTE — Patient Instructions (Signed)
Thank you for allowing [place] to care for you today!  Await pathology results of polyps removed.  Will make recommendation for next colonoscopy at that time ( probably 3 years)  Resume previous diet and medications today.  Return to your normal activities tomorrow.     YOU HAD AN ENDOSCOPIC PROCEDURE TODAY AT [facility]:   Refer to the procedure report that was given to you for any specific questions about what was found during the examination.  If the procedure report does not answer your questions, please call your gastroenterologist to clarify.  If you requested that your care partner not be given the details of your procedure findings, then the procedure report has been included in a sealed envelope for you to review at your convenience later.  YOU SHOULD EXPECT: Some feelings of bloating in the abdomen. Passage of more gas than usual.  Walking can help get rid of the air that was put into your GI tract during the procedure and reduce the bloating. If you had a lower endoscopy (such as a colonoscopy or flexible sigmoidoscopy) you may notice spotting of blood in your stool or on the toilet paper. If you underwent a bowel prep for your procedure, you may not have a normal bowel movement for a few days.  Please Note:  You might notice some irritation and congestion in your nose or some drainage.  This is from the oxygen used during your procedure.  There is no need for concern and it should clear up in a day or so.  SYMPTOMS TO REPORT IMMEDIATELY:   Following lower endoscopy (colonoscopy or flexible sigmoidoscopy):  Excessive amounts of blood in the stool  Significant tenderness or worsening of abdominal pains  Swelling of the abdomen that is new, acute  Fever of 100F or higher   For urgent or emergent issues, a gastroenterologist can be reached at any hour by calling [phone].   DIET:  We do recommend a small meal at first, but then you may proceed to your regular  diet.  Drink plenty of fluids but you should avoid alcoholic beverages for 24 hours.  ACTIVITY:  You should plan to take it easy for the rest of today and you should NOT DRIVE or use heavy machinery until tomorrow (because of the sedation medicines used during the test).    FOLLOW UP: Our staff will call the number listed on your records 48-72 hours following your procedure to check on you and address any questions or concerns that you may have regarding the information given to you following your procedure. If we do not reach you, we will leave a message.  We will attempt to reach you two times.  During this call, we will ask if you have developed any symptoms of COVID 19. If you develop any symptoms (ie: fever, flu-like symptoms, shortness of breath, cough etc.) before then, please call [phone].  If you test positive for Covid 19 in the 2 weeks post procedure, please call and report this information to [place].    If any biopsies were taken you will be contacted by phone or by letter within the next 1-3 weeks.  Please call us at [phone] if you have not heard about the biopsies in 3 weeks.    SIGNATURES/CONFIDENTIALITY: You and/or your care partner have signed paperwork which will be entered into your electronic medical record.  These signatures attest to the fact that that the information above on your After Visit Summary  has been reviewed and is understood.  Full responsibility of the confidentiality of this discharge information lies with you and/or your care-partner. 

## 2019-06-30 NOTE — Progress Notes (Signed)
PT taken to PACU. Monitors in place. VSS. Report given to RN. 

## 2019-07-02 ENCOUNTER — Telehealth: Payer: Self-pay

## 2019-07-02 NOTE — Telephone Encounter (Signed)
  Follow up Call-  Call back number 06/30/2019 05/02/2018  Post procedure Call Back phone  # [phone] wife number ok to speak with her.  Permission to leave phone message Yes Yes  Some recent data might be hidden     Patient questions:  Do you have a fever, pain , or abdominal swelling? No. Pain Score  0 *  Have you tolerated food without any problems? Yes.    Have you been able to return to your normal activities? Yes.    Do you have any questions about your discharge instructions: Diet   No. Medications  No. Follow up visit  No.  Do you have questions or concerns about your Care? No.  Actions: * If pain score is 4 or above: No action needed, pain <4.  1. Have you developed a fever since your procedure? no  2.   Have you had an respiratory symptoms (SOB or cough) since your procedure? no  3.   Have you tested positive for COVID 19 since your procedure no  4.   Have you had any family members/close contacts diagnosed with the COVID 19 since your procedure?  no   If yes to any of these questions please route to Joylene John, RN and Alphonsa Gin, Therapist, sports.

## 2019-07-15 DIAGNOSIS — H43313 Vitreous membranes and strands, bilateral: Secondary | ICD-10-CM | POA: Diagnosis not present

## 2019-08-14 ENCOUNTER — Other Ambulatory Visit: Payer: Self-pay

## 2019-08-14 ENCOUNTER — Inpatient Hospital Stay: Payer: Medicare Other | Attending: Oncology | Admitting: Oncology

## 2019-08-14 VITALS — BP 140/65 | HR 77 | Temp 97.9°F | Resp 21 | Ht 71.0 in | Wt 242.5 lb

## 2019-08-14 DIAGNOSIS — N529 Male erectile dysfunction, unspecified: Secondary | ICD-10-CM | POA: Insufficient documentation

## 2019-08-14 DIAGNOSIS — C211 Malignant neoplasm of anal canal: Secondary | ICD-10-CM | POA: Diagnosis not present

## 2019-08-14 DIAGNOSIS — Z85048 Personal history of other malignant neoplasm of rectum, rectosigmoid junction, and anus: Secondary | ICD-10-CM | POA: Insufficient documentation

## 2019-08-14 DIAGNOSIS — Z9221 Personal history of antineoplastic chemotherapy: Secondary | ICD-10-CM | POA: Insufficient documentation

## 2019-08-14 DIAGNOSIS — Z923 Personal history of irradiation: Secondary | ICD-10-CM | POA: Diagnosis not present

## 2019-08-14 NOTE — Progress Notes (Signed)
Kirk [name] OFFICE PROGRESS NOTE   Diagnosis: Anal cancer  INTERVAL HISTORY:   Kirk [name] returns as scheduled.  He feels well.  Good appetite.  He is working.  He reports recent episodes of discomfort in the lower abdomen.  He felt he may be developing a kidney stone.  The pain has resolved. He underwent a colonoscopy 06/30/2019.  Multiple polyps were removed.  The pathology confirmed tubular adenomas without high-grade dysplasia or malignancy.  Objective:  Vital signs in last 24 hours:  Blood pressure 140/65, pulse 77, temperature 97.9 F (36.6 C), temperature source Temporal, resp. rate (!) 21, height 5\' 11"  (1.803 m), weight 242 lb 8 oz (110 kg), SpO2 96 %.    HEENT: Neck without mass Lymphatics: No cervical, supraclavicular, axillary, or inguinal nodes GI: No hepatosplenomegaly, no mass, nontender Vascular: No leg edema      Lab Results:  Lab Results  Component Value Date   WBC 8.4 01/08/2018   HGB 14.8 01/08/2018   HCT 44.6 01/08/2018   MCV 97.6 01/08/2018   PLT 183 01/08/2018   NEUTROABS 6.3 01/08/2018    CMP  Lab Results  Component Value Date   NA 140 01/08/2018   K 4.4 01/08/2018   CL 103 01/08/2018   CO2 27 01/08/2018   GLUCOSE 99 01/08/2018   BUN 12 01/08/2018   CREATININE 0.92 01/08/2018   CALCIUM 9.4 01/08/2018   PROT 7.2 01/08/2018   ALBUMIN 4.1 01/08/2018   AST 38 01/08/2018   ALT 44 01/08/2018   ALKPHOS 69 01/08/2018   BILITOT 0.8 01/08/2018   GFRNONAA >60 01/08/2018   GFRAA >60 01/08/2018     Medications: I have reviewed the patient's current medications.   Assessment/Plan:  1.Squamous cell carcinoma of the anal canal, HPV positive.  2. Staging CT/PET scan with tiny lung nodules, hypermetabolic retroperitoneal, right inguinal, and left supraclavicular nodes . FNA biopsy of a left supraclavicular lymph node on 05/03/2012 confirmed metastatic squamous cell carcinoma.  -Cycle 1 of 5-fluorouracil/mitomycin-C and  concurrent radiation on 05/20/2012. Cycle 2 of 5-fluorouracil/mitomycin-C on 06/24/2012 with completion of radiation on 06/28/2012.  -Restaging PET scan 08/02/2012 with resolution of the hypermetabolic anal primary and right inguinal lymph node. Persistent hypermetabolic supraclavicular and retroperitoneal nodes.  -cycle 1 of salvage Taxol/carboplatin chemotherapy on 08/30/2012 . Restaging CTs after 4 cycles of Taxol/carboplatin confirmed resolution of retroperitoneal lymphadenopathy and a decrease in the size of a previously noted left supraclavicular node. He completed cycle 6 of Taxol/carboplatin on 01/02/2013.  -A negative flexible sigmoidoscopy 04/01/2013  -Staging CT scans 10/09/2013 with stable small left supraclavicular node, no evidence of progressive metastatic disease  -Negative colonoscopy 03/19/2015 -Colonoscopy 05/02/2018-no evidence of recurrent anal cancer, multiple tubular adenomas removed from the colon -Colonoscopy 06/30/2019-no evidence of recurrent anal cancer, multiple tubular adenomas removed 3. Hypermetabolic activity at the right supraglottic larynx on the PET scan 04/17/2012 with associated soft tissue fullness status post a negative ENT evaluation by Dr. Constance Holster.  4. History of aortic stenosis-status post aortic valve replacement and coronary artery bypass surgery in November 2012.  5. Mucositis following cycle 1 chemotherapy. Resolved.  6. Skin breakdown at the perineum and penis secondary to chemotherapy and radiation. Resolved.  7. 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 Bone pain after Neulasta  9. Generalized seizure 09/24/2012-negative brain CT,? Etiology. He has been evaluated by neurology and was prescribed Vimpat.  10.history of Thrombocytopenia secondary chemotherapy  11. Peripheral neuropathy-predating chemotherapy  12. Erectile dysfunction-followed by Dr. Risa Grill  Disposition: Mr. Neiswonger remains in clinical remission from anal  cancer.  He will return contact us for recurrent left abdominal pain.  He will return for an office visit in 1 year.  Betsy Coder, MD  08/14/2019  3:55 PM

## 2019-08-15 ENCOUNTER — Telehealth: Payer: Self-pay | Admitting: Oncology

## 2019-08-15 NOTE — Telephone Encounter (Signed)
Scheduled per los. Called and left msg. Mailed printout  °

## 2019-08-19 ENCOUNTER — Other Ambulatory Visit: Payer: Self-pay | Admitting: Cardiology

## 2019-10-13 ENCOUNTER — Ambulatory Visit: Payer: Medicare Other

## 2019-10-22 DIAGNOSIS — G4700 Insomnia, unspecified: Secondary | ICD-10-CM | POA: Diagnosis not present

## 2019-10-22 DIAGNOSIS — Z6835 Body mass index (BMI) 35.0-35.9, adult: Secondary | ICD-10-CM | POA: Diagnosis not present

## 2019-10-22 DIAGNOSIS — I10 Essential (primary) hypertension: Secondary | ICD-10-CM | POA: Diagnosis not present

## 2019-10-22 DIAGNOSIS — I480 Paroxysmal atrial fibrillation: Secondary | ICD-10-CM | POA: Diagnosis not present

## 2019-10-22 DIAGNOSIS — E7800 Pure hypercholesterolemia, unspecified: Secondary | ICD-10-CM | POA: Diagnosis not present

## 2019-10-22 DIAGNOSIS — E1140 Type 2 diabetes mellitus with diabetic neuropathy, unspecified: Secondary | ICD-10-CM | POA: Diagnosis not present

## 2019-10-22 DIAGNOSIS — N1831 Chronic kidney disease, stage 3a: Secondary | ICD-10-CM | POA: Diagnosis not present

## 2019-10-22 DIAGNOSIS — E291 Testicular hypofunction: Secondary | ICD-10-CM | POA: Diagnosis not present

## 2019-10-22 DIAGNOSIS — G629 Polyneuropathy, unspecified: Secondary | ICD-10-CM | POA: Diagnosis not present

## 2019-10-22 DIAGNOSIS — R0989 Other specified symptoms and signs involving the circulatory and respiratory systems: Secondary | ICD-10-CM | POA: Diagnosis not present

## 2019-10-22 DIAGNOSIS — I2510 Atherosclerotic heart disease of native coronary artery without angina pectoris: Secondary | ICD-10-CM | POA: Diagnosis not present

## 2019-10-24 ENCOUNTER — Other Ambulatory Visit: Payer: Self-pay | Admitting: Cardiology

## 2019-10-24 MED ORDER — LIPITOR 80 MG PO TABS: 80.0000 mg | ORAL_TABLET | Freq: Every day | ORAL | 2 refills | Status: DC

## 2019-10-27 ENCOUNTER — Telehealth: Payer: Self-pay

## 2019-10-27 NOTE — Telephone Encounter (Signed)
**Note De-Identified  Obfuscation** I have notified [place] of this approval.

## 2019-10-27 NOTE — Telephone Encounter (Signed)
I did a name brand Lipitor PA through covermymeds and received the following message:  Zadquiel Tee Key: [id number] - PA Case ID: [id number] - Rx #: [id number]  Outcome  Approved today  PA Case: [id number] Status: Approved Coverage Starts on: 09/05/2019 12:00:00 AM, Coverage Ends on: 09/03/2020 12:00:00 AM.  Drug Lipitor 80MG  tablets  Form [employer] Electronic PA Form

## 2019-10-30 ENCOUNTER — Other Ambulatory Visit: Payer: Self-pay

## 2019-10-30 MED ORDER — LIPITOR 80 MG PO TABS: 80.0000 mg | ORAL_TABLET | Freq: Every day | ORAL | 2 refills | Status: DC

## 2019-11-26 DIAGNOSIS — N486 Induration penis plastica: Secondary | ICD-10-CM | POA: Diagnosis not present

## 2020-01-07 DIAGNOSIS — M170 Bilateral primary osteoarthritis of knee: Secondary | ICD-10-CM | POA: Diagnosis not present

## 2020-05-05 DIAGNOSIS — E7800 Pure hypercholesterolemia, unspecified: Secondary | ICD-10-CM | POA: Diagnosis not present

## 2020-05-05 DIAGNOSIS — N1831 Chronic kidney disease, stage 3a: Secondary | ICD-10-CM | POA: Diagnosis not present

## 2020-05-05 DIAGNOSIS — G629 Polyneuropathy, unspecified: Secondary | ICD-10-CM | POA: Diagnosis not present

## 2020-05-05 DIAGNOSIS — Z0000 Encounter for general adult medical examination without abnormal findings: Secondary | ICD-10-CM | POA: Diagnosis not present

## 2020-05-05 DIAGNOSIS — E1140 Type 2 diabetes mellitus with diabetic neuropathy, unspecified: Secondary | ICD-10-CM | POA: Diagnosis not present

## 2020-05-05 DIAGNOSIS — R0989 Other specified symptoms and signs involving the circulatory and respiratory systems: Secondary | ICD-10-CM | POA: Diagnosis not present

## 2020-05-05 DIAGNOSIS — I480 Paroxysmal atrial fibrillation: Secondary | ICD-10-CM | POA: Diagnosis not present

## 2020-05-05 DIAGNOSIS — Z1389 Encounter for screening for other disorder: Secondary | ICD-10-CM | POA: Diagnosis not present

## 2020-05-05 DIAGNOSIS — I2510 Atherosclerotic heart disease of native coronary artery without angina pectoris: Secondary | ICD-10-CM | POA: Diagnosis not present

## 2020-05-05 DIAGNOSIS — Z125 Encounter for screening for malignant neoplasm of prostate: Secondary | ICD-10-CM | POA: Diagnosis not present

## 2020-05-05 DIAGNOSIS — I10 Essential (primary) hypertension: Secondary | ICD-10-CM | POA: Diagnosis not present

## 2020-05-05 DIAGNOSIS — E291 Testicular hypofunction: Secondary | ICD-10-CM | POA: Diagnosis not present

## 2020-06-07 DIAGNOSIS — M170 Bilateral primary osteoarthritis of knee: Secondary | ICD-10-CM | POA: Diagnosis not present

## 2020-06-23 ENCOUNTER — Encounter: Payer: Self-pay | Admitting: Cardiology

## 2020-06-23 ENCOUNTER — Ambulatory Visit (INDEPENDENT_AMBULATORY_CARE_PROVIDER_SITE_OTHER): Payer: Medicare Other | Admitting: Cardiology

## 2020-06-23 ENCOUNTER — Other Ambulatory Visit: Payer: Self-pay

## 2020-06-23 VITALS — BP 130/78 | HR 63 | Ht 71.0 in | Wt 231.0 lb

## 2020-06-23 DIAGNOSIS — Z951 Presence of aortocoronary bypass graft: Secondary | ICD-10-CM

## 2020-06-23 DIAGNOSIS — I2583 Coronary atherosclerosis due to lipid rich plaque: Secondary | ICD-10-CM

## 2020-06-23 DIAGNOSIS — I2510 Atherosclerotic heart disease of native coronary artery without angina pectoris: Secondary | ICD-10-CM | POA: Diagnosis not present

## 2020-06-23 DIAGNOSIS — Z952 Presence of prosthetic heart valve: Secondary | ICD-10-CM | POA: Diagnosis not present

## 2020-06-23 DIAGNOSIS — E7800 Pure hypercholesterolemia, unspecified: Secondary | ICD-10-CM

## 2020-06-23 NOTE — Progress Notes (Signed)
Cardiology Office Note:    Date:  06/23/2020   ID:  Kirk [name], DOB [birth year], MRN [id number]  PCP:  Kirk Dus, MD  [facility] Cardiologist:  Kirk Furbish, MD  [facility] Electrophysiologist:  None   Referring MD: Kirk Dus, MD     History of Present Illness:    Kirk [name] is a 74 y.o. male, goes by Kirk [name], here for follow-up of prior aortic valve replacement in 2012 secondary to severe aortic stenosis with concomitant single-vessel bypass LIMA to LAD.  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  Had supraclavicular as well as periabdominal aortic lymph nodes.  Saw oncology at [facility] and had chemoradiation.  Cancer is in remission.  Has been enjoying his grandchildren.  Had back surgery in May 2020 by Kirk [name]. Back to painting. Doing well.  Kirk [name] is his wife.  Recently purchased a [employer] truck.  He is to have Andrews, 11 of them prior.  Overall doing well without any fevers chills nausea vomiting syncope bleeding.    Past Medical History:  Diagnosis Date  . Allergy   . Angina    6 wks. ago  . Aortic stenosis    valve replacement  . Arthritis    1995- cerv. fusion- [facility]  . Atrial fibrillation ([place]) 08/20/2011   Post op heart surgery, no problems since, no meds  . Blood in stool   . Cancer ([place]) 04/09/12   Rectum bx=invasive squamous cell carcinoma  . CHF (congestive heart failure) ([place])   . Coronary artery disease    aortic stenosis, CAD  . Diabetes mellitus    Type 2  . ED (erectile dysfunction)   . ED (erectile dysfunction)   . Elevated cholesterol   . Heart murmur    no problems  . History of kidney stones    passed, lithrotrispy and surgery  . History of radiation therapy 05/20/12-06/28/12   anal cancer=54gy total dose  . Hypertension   . Kidney stones   . Malignant neoplasm of anal canal ([place]) 05/12/2012  . Malignant neoplasm of other sites of rectum, rectosigmoid junction, and anus 04/12/2012  . Myocardial  infarction ([place]) 2012  . Personal history of colonic polyps 03/20/2012  . Rectal bleeding   . Rectal mass 03/20/2012  . 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   #100mm [facility] Ease pericardial tissue valve   . 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 Seizure disorder, grand mal ([place])    only 1  . Seizures ([place])    after taking Oxycodone; not prescribed seizure med  . Shortness of breath   . Thrombosed hemorrhoids     Past Surgical History:  Procedure Laterality Date  . ANAL FISSURE REPAIR     patient unsure of date  . ANTERIOR CERVICAL DECOMP/DISCECTOMY FUSION N/A 01/22/2018   Procedure: ANTERIOR CERVICAL DECOMPRESSION/DISCECTOMY FUSION CERVICAL SIX- CERVICAL SEVEN;  Surgeon: Kirk Levine, MD;  Location: [place];  Service: Neurosurgery;  Laterality: N/A;  ANTERIOR CERVICAL DECOMPRESSION/DISCECTOMY FUSION CERVICAL SIX- CERVICAL SEVEN  . AORTIC VALVE REPLACEMENT  08/11/2011   Procedure: AORTIC VALVE REPLACEMENT (AVR);  Surgeon: Kirk Alberts, MD;  Location: [place];  Service: Open Heart Surgery;  Laterality: N/A;  . [place]  . COLONOSCOPY     hx polyps  . CORONARY ARTERY BYPASS GRAFT  08/16/2011   Procedure: CORONARY ARTERY BYPASS GRAFTING (CABG);  Surgeon: Kirk Alberts,  MD;  Location: [facility] OR;  Service: Open Heart Surgery;  Laterality: N/A;  CABG times one using left internal mammary artery  . LITHOTRIPSY  2001 AND 2002   Kirk [name]  . POLYPECTOMY     colon    Current Medications: Current Meds  Medication Sig  . amitriptyline (ELAVIL) 25 MG tablet Take 25 mg by mouth at bedtime as needed for sleep.   Marland Kitchen aspirin EC 81 MG tablet Take 81 mg by mouth daily.  . Cholecalciferol 1000 UNITS tablet 2,000 Units. Take 185 mg by mouth daily.  . DULoxetine (CYMBALTA) 60 MG capsule Take 60 mg by mouth daily.  Marland Kitchen LIPITOR 80 MG tablet Take 1 tablet (80 mg total) by mouth daily.  Marland Kitchen losartan (COZAAR) 50 MG tablet Take 1 tablet (50 mg total) by mouth  daily.  . metoprolol tartrate (LOPRESSOR) 25 MG tablet TAKE 1 TABLET BY MOUTH TWICE A DAY  . MYRBETRIQ 25 MG TB24 tablet Take 25 mg by mouth at bedtime.   Marland Kitchen OVER THE COUNTER MEDICATION Take 1 tablet by mouth daily. Z-Bec Zinc. vitamin B, E And C  . saxagliptin HCl (ONGLYZA) 5 MG TABS tablet Take 5 mg by mouth daily.  . Tetrahydrozoline HCl (VISINE OP) Place 1 drop into both eyes as needed. Visine S  . Vitamins-Lipotropics (LIPO-FLAVONOID PLUS) TABS Take 1 tablet by mouth 3 (three) times daily after meals.       Allergies:   Cat hair extract, Atorvastatin, Dust mite extract, Metformin, and Oxycodone   Social History   Socioeconomic History  . Marital status: Married    Spouse name: Not on file  . Number of children: Not on file  . Years of education: Not on file  . Highest education level: Not on file  Occupational History  . Not on file  Tobacco Use  . Smoking status: Never Smoker  . Smokeless tobacco: Never Used  Vaping Use  . Vaping Use: Never used  Substance and Sexual Activity  . Alcohol use: Not Currently    Alcohol/week: 0.0 standard drinks  . Drug use: No  . Sexual activity: Not on file  Other Topics Concern  . 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 Difficulty of Paying Living Expenses: Not on file  Food Insecurity:   . Worried About Charity fundraiser in the Last Year: Not on file  . Ran Out of Food in the Last Year: Not on file  Transportation Needs:   . Lack of Transportation (Medical): Not on file  . Lack of Transportation (Non-Medical): Not on file  Physical Activity:   . Days of Exercise per Week: Not on file  . Minutes of Exercise per Session: Not on file  Stress:   . Feeling of Stress : Not on file  Social Connections:   . Frequency of Communication with Friends and Family: Not on file  . Frequency of Social Gatherings with Friends and  Family: Not on file  . Attends Religious Services: Not on file  . Active Member of Clubs or Organizations: Not on file  . Attends Archivist Meetings: Not on file  . Marital Status: Not on file     Family History: The patient's family history includes Cancer in his mother; Esophageal cancer in his maternal grandfather; Heart attack in his father. There is no history of Anesthesia problems, Hypotension, Malignant  hyperthermia, Pseudochol deficiency, Colon cancer, Colon polyps, Stomach cancer, or Rectal cancer.  ROS:   Please see the history of present illness.     All other systems reviewed and are negative.  EKGs/Labs/Other Studies Reviewed:      EKG:  EKG is  ordered today.  The ekg ordered today demonstrates NSR 63 Prior EKG shows normal sinus rhythm.  Recent Labs: No results found for requested labs within last 8760 hours.  Recent Lipid Panel No results found for: CHOL, TRIG, HDL, CHOLHDL, VLDL, LDLCALC, LDLDIRECT   Risk Assessment/Calculations:       Physical Exam:    VS:  BP 130/78   Pulse 63   Ht 5\' 11"  (1.803 m)   Wt 231 lb (104.8 kg)   SpO2 99%   BMI 32.22 kg/m     Wt Readings from Last 3 Encounters:  06/23/20 231 lb (104.8 kg)  08/14/19 242 lb 8 oz (110 kg)  06/30/19 236 lb 9.6 oz (107.3 kg)     GEN:  Well nourished, well developed in no acute distress HEENT: Normal NECK: No JVD; No carotid bruits LYMPHATICS: No lymphadenopathy CARDIAC: RRR, soft SM, no rubs, gallops RESPIRATORY:  Clear to auscultation without rales, wheezing or rhonchi  ABDOMEN: Soft, non-tender, non-distended MUSCULOSKELETAL:  No edema; No deformity  SKIN: Warm and dry NEUROLOGIC:  Alert and oriented x 3 PSYCHIATRIC:  Normal affect   ASSESSMENT:    1. S/P AVR (aortic valve replacement)   2. Coronary atherosclerosis due to lipid rich plaque   3. S/P CABG x 1   4. Elevated cholesterol    PLAN:    In order of problems listed above:  CAD post CABG --Doing very  well.  Prior CABG aortic valve replacement in December 2012.  No anginal symptoms.  Continue with current plan.  Hyperlipidemia -Currently on atorvastatin 80 mg once a day.  Continue with current medical management.  Outside labs showed LDL of 68.  Excellent.  ALT was 47, overall normal liver function.  Creatinine 1.2.  Aortic valve replacement -Bioprosthetic, checking echocardiogram.  Has been since 2017.  Overall doing well.  No shortness of breath or any other significant symptoms.  We will see him back in 1 year.   Medication Adjustments/Labs and Tests Ordered: Current medicines are reviewed at length with the patient today.  Concerns regarding medicines are outlined above.  Orders Placed This Encounter  Procedures  . ECHOCARDIOGRAM COMPLETE   No orders of the defined types were placed in this encounter.   Patient Instructions  Medication Instructions:  No changes *If you need a refill on your cardiac medications before your next appointment, please call your pharmacy*   Lab Work: none If you have labs (blood work) drawn today and your tests are completely normal, you will receive your results only by: Marland Kitchen MyChart Message (if you have MyChart) OR . A paper copy in the mail If you have any lab test that is abnormal or we need to change your treatment, we will call you to review the results.   Testing/Procedures: Your physician has requested that you have an echocardiogram. Echocardiography is a painless test that uses sound waves to create images of your heart. It provides your doctor with information about the size and shape of your heart and how well your heart's chambers and valves are working. This procedure takes approximately one hour. There are no restrictions for this procedure.   Follow-Up: At [facility], you and your health needs are our  priority.  As part of our continuing mission to provide you with exceptional heart care, we have created designated Provider  Care Teams.  These Care Teams include your primary Cardiologist (physician) and Advanced Practice Providers (APPs -  Physician Assistants and Nurse Practitioners) who all work together to provide you with the care you need, when you need it.    Your next appointment:   12 month(s)  The format for your next appointment:   In Person  Provider:   You may see Kirk Furbish, MD or one of the following Advanced Practice Providers on your designated Care Team:    Truitt Merle, NP  Cecilie Kicks, NP  Kathyrn Drown, NP    Other Instructions      Signed, Kirk Furbish, MD  06/23/2020 10:37 AM    [place]

## 2020-06-23 NOTE — Patient Instructions (Signed)
Medication Instructions:  No changes *If you need a refill on your cardiac medications before your next appointment, please call your pharmacy*   Lab Work: none If you have labs (blood work) drawn today and your tests are completely normal, you will receive your results only by: Marland Kitchen MyChart Message (if you have MyChart) OR . A paper copy in the mail If you have any lab test that is abnormal or we need to change your treatment, we will call you to review the results.   Testing/Procedures: Your physician has requested that you have an echocardiogram. Echocardiography is a painless test that uses sound waves to create images of your heart. It provides your doctor with information about the size and shape of your heart and how well your heart's chambers and valves are working. This procedure takes approximately one hour. There are no restrictions for this procedure.   Follow-Up: At [facility], you and your health needs are our priority.  As part of our continuing mission to provide you with exceptional heart care, we have created designated Provider Care Teams.  These Care Teams include your primary Cardiologist (physician) and Advanced Practice Providers (APPs -  Physician Assistants and Nurse Practitioners) who all work together to provide you with the care you need, when you need it.    Your next appointment:   12 month(s)  The format for your next appointment:   In Person  Provider:   You may see [name], MD or one of the following Advanced Practice Providers on your designated Care Team:    Truitt Merle, NP  Cecilie Kicks, NP  Kathyrn Drown, NP    Other Instructions

## 2020-06-24 NOTE — Addendum Note (Signed)
Addended by: Rodman Key on: 06/24/2020 05:30 PM   Modules accepted: Orders

## 2020-07-01 DIAGNOSIS — Z23 Encounter for immunization: Secondary | ICD-10-CM | POA: Diagnosis not present

## 2020-07-15 ENCOUNTER — Ambulatory Visit (HOSPITAL_COMMUNITY): Payer: Medicare Other | Attending: Internal Medicine

## 2020-07-15 ENCOUNTER — Other Ambulatory Visit: Payer: Self-pay

## 2020-07-15 DIAGNOSIS — Z952 Presence of prosthetic heart valve: Secondary | ICD-10-CM | POA: Insufficient documentation

## 2020-07-15 LAB — ECHOCARDIOGRAM COMPLETE
AR max vel: 1.88 cm2
AV Area VTI: 2.14 cm2
AV Area mean vel: 2.01 cm2
AV Mean grad: 20 mmHg
AV Peak grad: 24.3 mmHg
Ao pk vel: 2.47 m/s
Area-P 1/2: 1.5 cm2
S' Lateral: 2.9 cm

## 2020-07-15 MED ORDER — PERFLUTREN LIPID MICROSPHERE
1.0000 mL | INTRAVENOUS | Status: AC | PRN
  Administered 2020-07-15: 2 mL via INTRAVENOUS

## 2020-08-13 ENCOUNTER — Inpatient Hospital Stay: Payer: Medicare Other | Attending: Oncology | Admitting: Oncology

## 2020-08-13 ENCOUNTER — Other Ambulatory Visit: Payer: Self-pay

## 2020-08-13 VITALS — BP 144/59 | HR 64 | Temp 97.0°F | Resp 17 | Ht 71.0 in | Wt 228.6 lb

## 2020-08-13 DIAGNOSIS — Z9221 Personal history of antineoplastic chemotherapy: Secondary | ICD-10-CM | POA: Diagnosis not present

## 2020-08-13 DIAGNOSIS — N529 Male erectile dysfunction, unspecified: Secondary | ICD-10-CM | POA: Insufficient documentation

## 2020-08-13 DIAGNOSIS — Z923 Personal history of irradiation: Secondary | ICD-10-CM | POA: Diagnosis not present

## 2020-08-13 DIAGNOSIS — Z85048 Personal history of other malignant neoplasm of rectum, rectosigmoid junction, and anus: Secondary | ICD-10-CM | POA: Diagnosis not present

## 2020-08-13 DIAGNOSIS — C211 Malignant neoplasm of anal canal: Secondary | ICD-10-CM | POA: Diagnosis not present

## 2020-08-13 NOTE — Progress Notes (Signed)
[place] OFFICE PROGRESS NOTE   Diagnosis: Anal cancer  INTERVAL HISTORY:   Kirk [name] returns as scheduled. He feels well. Good appetite. No difficulty with bowel function. No complaint. He is working. He reports intentional weight loss with a diet change.  Objective:  Vital signs in last 24 hours:  Blood pressure (!) 144/59, pulse 64, temperature (!) 97 F (36.1 C), temperature source Tympanic, resp. rate 17, height 5\' 11"  (1.803 m), weight 228 lb 9.6 oz (103.7 kg), SpO2 97 %.    HEENT: Neck without mass Lymphatics: No cervical, supraclavicular, axillary, or inguinal nodes Resp: Lungs clear bilaterally Cardio: Regular rate and rhythm GI: No hepatosplenomegaly, no mass, nontender Rectal: No anal or rectal mass. Vascular: No leg edema   Lab Results:  Lab Results  Component Value Date   WBC 8.4 01/08/2018   HGB 14.8 01/08/2018   HCT 44.6 01/08/2018   MCV 97.6 01/08/2018   PLT 183 01/08/2018   NEUTROABS 6.3 01/08/2018    CMP  Lab Results  Component Value Date   NA 140 01/08/2018   K 4.4 01/08/2018   CL 103 01/08/2018   CO2 27 01/08/2018   GLUCOSE 99 01/08/2018   BUN 12 01/08/2018   CREATININE 0.92 01/08/2018   CALCIUM 9.4 01/08/2018   PROT 7.2 01/08/2018   ALBUMIN 4.1 01/08/2018   AST 38 01/08/2018   ALT 44 01/08/2018   ALKPHOS 69 01/08/2018   BILITOT 0.8 01/08/2018   GFRNONAA >60 01/08/2018   GFRAA >60 01/08/2018     Medications: I have reviewed the patient's current medications.   Assessment/Plan: 1.Squamous cell carcinoma of the anal canal, HPV positive.  2. Staging CT/PET scan with tiny lung nodules, hypermetabolic retroperitoneal, right inguinal, and left supraclavicular nodes . FNA biopsy of a left supraclavicular lymph node on 05/03/2012 confirmed metastatic squamous cell carcinoma.  -Cycle 1 of 5-fluorouracil/mitomycin-C and concurrent radiation on 05/20/2012. Cycle 2 of 5-fluorouracil/mitomycin-C on 06/24/2012 with  completion of radiation on 06/28/2012.  -Restaging PET scan 08/02/2012 with resolution of the hypermetabolic anal primary and right inguinal lymph node. Persistent hypermetabolic supraclavicular and retroperitoneal nodes.  -cycle 1 of salvage Taxol/carboplatin chemotherapy on 08/30/2012 . Restaging CTs after 4 cycles of Taxol/carboplatin confirmed resolution of retroperitoneal lymphadenopathy and a decrease in the size of a previously noted left supraclavicular node. He completed cycle 6 of Taxol/carboplatin on 01/02/2013.  -A negative flexible sigmoidoscopy 04/01/2013  -Staging CT scans 10/09/2013 with stable small left supraclavicular node, no evidence of progressive metastatic disease  -Negative colonoscopy 03/19/2015 -Colonoscopy 05/02/2018-no evidence of recurrent anal cancer, multiple tubular adenomas removed from the colon -Colonoscopy 06/30/2019-no evidence of recurrent anal cancer, multiple tubular adenomas removed 3. Hypermetabolic activity at the right supraglottic larynx on the PET scan 04/17/2012 with associated soft tissue fullness status post a negative ENT evaluation by Dr. Constance Holster.  4. History of aortic stenosis-status post aortic valve replacement and coronary artery bypass surgery in November 2012.  5. Mucositis following cycle 1 chemotherapy. Resolved.  6. Skin breakdown at the perineum and penis secondary to chemotherapy and radiation. Resolved.  7. 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 Bone pain after Neulasta  9. Generalized seizure 09/24/2012-negative brain CT,? Etiology. He has been evaluated by neurology and was prescribed Vimpat.  10.history of Thrombocytopenia secondary chemotherapy  11. Peripheral neuropathy-predating chemotherapy  12. Erectile dysfunction-followed by Dr. Risa Grill   Disposition: Kirk [name] is in remission from anal cancer. He will return for an office visit in 1 year. He will  continue colonoscopy surveillance with Dr.  Havery Moros.  Betsy Coder, MD  08/13/2020  12:48 PM

## 2020-08-24 ENCOUNTER — Telehealth: Payer: Self-pay

## 2020-08-24 NOTE — Telephone Encounter (Signed)
**Note De-Identified  Obfuscation** I stated a name brand Lipitor PA through covermymeds: Key: [id number]

## 2020-10-27 DIAGNOSIS — M1712 Unilateral primary osteoarthritis, left knee: Secondary | ICD-10-CM | POA: Diagnosis not present

## 2020-11-08 DIAGNOSIS — H5213 Myopia, bilateral: Secondary | ICD-10-CM | POA: Diagnosis not present

## 2020-12-08 DIAGNOSIS — G4700 Insomnia, unspecified: Secondary | ICD-10-CM | POA: Diagnosis not present

## 2020-12-08 DIAGNOSIS — E7800 Pure hypercholesterolemia, unspecified: Secondary | ICD-10-CM | POA: Diagnosis not present

## 2020-12-08 DIAGNOSIS — I10 Essential (primary) hypertension: Secondary | ICD-10-CM | POA: Diagnosis not present

## 2020-12-08 DIAGNOSIS — E1140 Type 2 diabetes mellitus with diabetic neuropathy, unspecified: Secondary | ICD-10-CM | POA: Diagnosis not present

## 2020-12-09 DIAGNOSIS — E291 Testicular hypofunction: Secondary | ICD-10-CM | POA: Diagnosis not present

## 2020-12-09 DIAGNOSIS — I10 Essential (primary) hypertension: Secondary | ICD-10-CM | POA: Diagnosis not present

## 2020-12-09 DIAGNOSIS — E1140 Type 2 diabetes mellitus with diabetic neuropathy, unspecified: Secondary | ICD-10-CM | POA: Diagnosis not present

## 2021-02-14 DIAGNOSIS — M170 Bilateral primary osteoarthritis of knee: Secondary | ICD-10-CM | POA: Diagnosis not present

## 2021-05-05 NOTE — Telephone Encounter (Signed)
**Note De-Identified  Obfuscation** Charlies Constable Key: [id number] - PA Case ID: [id number] Outcome: Approved on August 24, 2020 Coverage Starts on: 09/05/2019 12:00:00 AM, Coverage Ends on: 09/03/2021 12:00:00 AM. Questions? Contact [phone]. Drug: Lipitor '80MG'$  tablets Form [employer] Electronic PA Form  [facility] is aware of this approval.

## 2021-05-23 DIAGNOSIS — E7800 Pure hypercholesterolemia, unspecified: Secondary | ICD-10-CM | POA: Diagnosis not present

## 2021-05-23 DIAGNOSIS — Z0000 Encounter for general adult medical examination without abnormal findings: Secondary | ICD-10-CM | POA: Diagnosis not present

## 2021-05-23 DIAGNOSIS — E1140 Type 2 diabetes mellitus with diabetic neuropathy, unspecified: Secondary | ICD-10-CM | POA: Diagnosis not present

## 2021-05-23 DIAGNOSIS — I10 Essential (primary) hypertension: Secondary | ICD-10-CM | POA: Diagnosis not present

## 2021-07-18 ENCOUNTER — Ambulatory Visit (INDEPENDENT_AMBULATORY_CARE_PROVIDER_SITE_OTHER): Payer: Medicare Other | Admitting: Cardiology

## 2021-07-18 ENCOUNTER — Encounter: Payer: Self-pay | Admitting: Cardiology

## 2021-07-18 ENCOUNTER — Other Ambulatory Visit: Payer: Self-pay

## 2021-07-18 DIAGNOSIS — Z952 Presence of prosthetic heart valve: Secondary | ICD-10-CM

## 2021-07-18 DIAGNOSIS — E7800 Pure hypercholesterolemia, unspecified: Secondary | ICD-10-CM | POA: Diagnosis not present

## 2021-07-18 DIAGNOSIS — I2510 Atherosclerotic heart disease of native coronary artery without angina pectoris: Secondary | ICD-10-CM

## 2021-07-18 DIAGNOSIS — Z951 Presence of aortocoronary bypass graft: Secondary | ICD-10-CM | POA: Diagnosis not present

## 2021-07-18 NOTE — Progress Notes (Signed)
Cardiology Office Note:    Date:  07/18/2021   ID:  Kirk [name], DOB 03/13/1946, MRN [id number]  PCP:  Maury Dus, MD  [facility] Cardiologist:  [name], MD  [facility] Electrophysiologist:  None   Referring MD: Maury Dus, MD     History of Present Illness:    Kirk [name] is a 75 y.o. male, goes by Kirk [name], here for follow-up of prior aortic valve replacement in 2012 secondary to severe aortic stenosis with concomitant single-vessel bypass LIMA to LAD.  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  Had supraclavicular as well as periabdominal aortic lymph nodes.  Saw oncology at [facility] and had chemoradiation.  Cancer is in remission.  Had back surgery in May 2020 by Dr. Vertell Limber. Kirk [name] is his wife.  Recently purchased a [employer] truck.  His daughter is getting remarried in [place] close to Christmas.  Today, he is doing well. He has scabs along his L arm from cleaning his gutters of leaves. He was doing well losing weight but recently gained 11 lbs. He is working on losing weight again. Otherwise, he has no major complaints.  He denies any palpitations, chest pain, or shortness of breath. No lightheadedness, headaches, syncope, orthopnea, PND, lower extremity edema or exertional symptoms.   Past Medical History:  Diagnosis Date   Allergy    Angina    6 wks. ago   Aortic stenosis    valve replacement   Arthritis    1995- cerv. fusion- [facility]   Atrial fibrillation ([place]) 08/20/2011   Post op heart surgery, no problems since, no meds   Blood in stool    Cancer ([place]) 04/09/12   Rectum bx=invasive squamous cell carcinoma   CHF (congestive heart failure) (HCC)    Coronary artery disease    aortic stenosis, CAD   Diabetes mellitus    Type 2   ED (erectile dysfunction)    ED (erectile dysfunction)    Elevated cholesterol    Heart murmur    no problems   History of kidney stones    passed, lithrotrispy and surgery   History of  radiation therapy 05/20/12-06/28/12   anal cancer=54gy total dose   Hypertension    Kidney stones    Malignant neoplasm of anal canal ([place]) 05/12/2012   Malignant neoplasm of other sites of rectum, rectosigmoid junction, and anus 04/12/2012   Myocardial infarction ([place]) 2012   Personal history of colonic polyps 03/20/2012   Rectal bleeding    Rectal mass 03/20/2012   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   #67mm [facility] Ease pericardial tissue valve    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   LIMA to diagonal branch    Seizure disorder, grand mal ([place])    only 1   Seizures ([place])    after taking Oxycodone; not prescribed seizure med   Shortness of breath    Thrombosed hemorrhoids     Past Surgical History:  Procedure Laterality Date   ANAL FISSURE REPAIR     patient unsure of date   ANTERIOR CERVICAL DECOMP/DISCECTOMY FUSION N/A 01/22/2018   Procedure: ANTERIOR CERVICAL DECOMPRESSION/DISCECTOMY FUSION CERVICAL SIX- CERVICAL SEVEN;  Surgeon: Erline Levine, MD;  Location: [place];  Service: Neurosurgery;  Laterality: N/A;  ANTERIOR CERVICAL DECOMPRESSION/DISCECTOMY FUSION CERVICAL SIX- CERVICAL SEVEN   AORTIC VALVE REPLACEMENT  08/11/2011   Procedure: AORTIC VALVE REPLACEMENT (AVR);  Surgeon: Rexene Alberts, MD;  Location: [place];  Service: Open Heart Surgery;  Laterality: N/A;   C4-6 FUSION  1995   DR STERN   COLONOSCOPY     hx polyps   CORONARY ARTERY BYPASS GRAFT  08/16/2011   Procedure: CORONARY ARTERY BYPASS GRAFTING (CABG);  Surgeon: Rexene Alberts, MD;  Location: [place];  Service: Open Heart Surgery;  Laterality: N/A;  CABG times one using left internal mammary artery   LITHOTRIPSY  2001 AND 2002   DR PETERSON   POLYPECTOMY     colon    Current Medications: Current Meds  Medication Sig   amitriptyline (ELAVIL) 25 MG tablet Take 25 mg by mouth at bedtime as needed for sleep.   aspirin EC 81 MG tablet Take 81 mg by mouth daily.   Cholecalciferol 1000 UNITS tablet 2,000 Units.  Take 185 mg by mouth daily.   DULoxetine (CYMBALTA) 60 MG capsule Take 60 mg by mouth daily.   losartan (COZAAR) 50 MG tablet Take 1 tablet (50 mg total) by mouth daily.   metoprolol tartrate (LOPRESSOR) 25 MG tablet TAKE 1 TABLET BY MOUTH TWICE A DAY   MYRBETRIQ 25 MG TB24 tablet Take 25 mg by mouth at bedtime.    OZEMPIC, 0.25 OR 0.5 MG/DOSE, 2 MG/1.5ML SOPN Inject into the skin once a week.   rosuvastatin (CRESTOR) 40 MG tablet Take 40 mg by mouth daily.   Tetrahydrozoline HCl (VISINE OP) Place 1 drop into both eyes as needed. Visine S   Vitamins-Lipotropics (LIPO-FLAVONOID PLUS) TABS Take 1 tablet by mouth 3 (three) times daily after meals.   Zinc Sulfate (ZINC-220 PO) Take 300 mg by mouth daily.     Allergies:   Cat hair extract, Atorvastatin, Dust mite extract, Metformin, and Oxycodone   Social History   Socioeconomic History   Marital status: Married    Spouse name: Not on file   Number of children: Not on file   Years of education: Not on file   Highest education level: Not on file  Occupational History   Not on file  Tobacco Use   Smoking status: Never   Smokeless tobacco: Never  Vaping Use   Vaping Use: Never used  Substance and Sexual Activity   Alcohol use: Not Currently    Alcohol/week: 0.0 standard drinks   Drug use: No   Sexual activity: Not on file  Other Topics Concern   Not on file  Social History Narrative   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 There is no history of Anesthesia problems, Hypotension, Malignant hyperthermia, Pseudochol deficiency, Colon cancer, Colon  polyps, Stomach cancer, or Rectal cancer.  ROS:   Please see the history of present illness.    (+) Scabs (LUE) (+) Weight gain  All other systems reviewed and are negative.  EKGs/Labs/Other Studies Reviewed:    Echo 07/15/20  1. Left ventricular ejection fraction, by estimation, is 60 to 65%. The  left ventricle has normal function. The left ventricle has no regional  wall motion abnormalities. Left ventricular diastolic parameters are  consistent with Grade I diastolic  dysfunction (impaired relaxation).   2. Right ventricular systolic function is normal. The right  ventricular  size is normal. Tricuspid regurgitation signal is inadequate for assessing  PA pressure.   3. The mitral valve is degenerative. Trivial mitral valve regurgitation.  No evidence of mitral stenosis. Moderate mitral annular calcification.   4. The aortic valve has been repaired/replaced. Aortic valve  regurgitation is not visualized. There is a 25 mm [facility] Ease  pericardial valve present in the aortic position. Procedure Date:  08/16/2011. Aortic valve mean gradient measures 20.0  mmHg. Prosthetic aortic valve leaflets not well visualized. See  calculations in Findings. Findings suggest grossly normal prosthetic valve  function, with mildly increased systolic gradient that is not  significantly changed since prior exam in 2017.   EKG:  EKG was personally reviewed 07/18/21: Sinus rhythm, rate 71 bpm with lateral T-wave inversion and  no significant changes 06/23/20: NSR 63 06/18/19: normal sinus rhythm.  Recent Labs: No results found for requested labs within last 8760 hours.  Recent Lipid Panel No results found for: CHOL, TRIG, HDL, CHOLHDL, VLDL, LDLCALC, LDLDIRECT   Risk Assessment/Calculations:       Physical Exam:    VS:  BP 118/74 (BP Location: Left Arm, Patient Position: Sitting, Cuff Size: Normal)   Pulse 71   Ht 5\' 11"  (1.803 m)   Wt 231 lb (104.8 kg)   BMI 32.22 kg/m     Wt  Readings from Last 3 Encounters:  07/18/21 231 lb (104.8 kg)  08/13/20 228 lb 9.6 oz (103.7 kg)  06/23/20 231 lb (104.8 kg)     GEN:  Well nourished, well developed in no acute distress HEENT: Normal NECK: No JVD; No carotid bruits LYMPHATICS: No lymphadenopathy CARDIAC: RRR, 1/6 soft SM, no rubs, gallops, scar noted RESPIRATORY:  Clear to auscultation without rales, wheezing or rhonchi  ABDOMEN: Soft, non-tender, non-distended MUSCULOSKELETAL:  No edema; No deformity  SKIN: Warm and dry NEUROLOGIC:  Alert and oriented x 3 PSYCHIATRIC:  Normal affect   ASSESSMENT:    1. Coronary artery disease involving native coronary artery of native heart without angina pectoris   2. S/P CABG x 1   3. S/P AVR (aortic valve replacement)   4. Elevated cholesterol     PLAN:    Coronary artery disease involving native coronary artery of native heart without angina pectoris Had single-vessel CABG LIMA to diagonal at the time of AVR. Diagonal branch with ostial stenosis in the past.  This was back in December 2012.  Doing well without any anginal symptoms.  Continue with goal-directed medical therapy which include aspirin 81 mg and rosuvastatin 40 mg metoprolol 25 mg.  Continue with current medical management.  S/P CABG x 1 As above LIMA  S/P AVR (aortic valve replacement) 25 mm magna ease Edwards pericardial tissue valve.  Aspirin.  Dental prophylaxis.  Doing well.  Echocardiogram EF 65% mean gradient was 20 mmHg.  Elevated cholesterol Crestor 40 mg a day no myalgias.  LDL from outside lab 68 ALT 35 creatinine 1.1 hemoglobin 15.2 hemoglobin A1c 6.3.   We will see him back in 1 year.   Medication Adjustments/Labs and Tests Ordered: Current medicines are reviewed at length with the patient today.  Concerns regarding medicines are outlined above.  Orders Placed This Encounter  Procedures   EKG 12-Lead    No orders of the defined types were placed in this encounter.   Patient  Instructions  Medication Instructions:  The current medical regimen is effective;  continue present plan and medications.  *If you need a refill on your  cardiac medications before your next appointment, please call your pharmacy*  Follow-Up: At [facility], you and your health needs are our priority.  As part of our continuing mission to provide you with exceptional heart care, we have created designated Provider Care Teams.  These Care Teams include your primary Cardiologist (physician) and Advanced Practice Providers (APPs -  Physician Assistants and Nurse Practitioners) who all work together to provide you with the care you need, when you need it.  We recommend signing up for the patient portal called "MyChart".  Sign up information is provided on this After Visit Summary.  MyChart is used to connect with patients for Virtual Visits (Telemedicine).  Patients are able to view lab/test results, encounter notes, upcoming appointments, etc.  Non-urgent messages can be sent to your provider as well.   To learn more about what you can do with MyChart, go to [email or web address].    Your next appointment:   1 year(s)  The format for your next appointment:   In Person  Provider:   [name], MD     Thank you for choosing [facility]!!     Wilhemina Bonito as a scribe for [name], MD.,have documented all relevant documentation on the behalf of [name], MD,as directed by  [name], MD while in the presence of [name], MD.  I, [name], MD, have reviewed all documentation for this visit. The documentation on 07/18/21 for the exam, diagnosis, procedures, and orders are all accurate and complete.   Signed, [name], MD  07/18/2021 8:40 AM    [facility]

## 2021-07-18 NOTE — Assessment & Plan Note (Signed)
Crestor 40 mg a day no myalgias.  LDL from outside lab 68 ALT 35 creatinine 1.1 hemoglobin 15.2 hemoglobin A1c 6.3.

## 2021-07-18 NOTE — Assessment & Plan Note (Signed)
25 mm magna ease Edwards pericardial tissue valve.  Aspirin.  Dental prophylaxis.  Doing well.  Echocardiogram EF 65% mean gradient was 20 mmHg.

## 2021-07-18 NOTE — Assessment & Plan Note (Addendum)
Had single-vessel CABG LIMA to diagonal at the time of AVR. Diagonal branch with ostial stenosis in the past.  This was back in December 2012.  Doing well without any anginal symptoms.  Continue with goal-directed medical therapy which include aspirin 81 mg and rosuvastatin 40 mg metoprolol 25 mg.  Continue with current medical management.

## 2021-07-18 NOTE — Patient Instructions (Signed)
Medication Instructions:  The current medical regimen is effective;  continue present plan and medications.  *If you need a refill on your cardiac medications before your next appointment, please call your pharmacy*  Follow-Up: At [facility], you and your health needs are our priority.  As part of our continuing mission to provide you with exceptional heart care, we have created designated Provider Care Teams.  These Care Teams include your primary Cardiologist (physician) and Advanced Practice Providers (APPs -  Physician Assistants and Nurse Practitioners) who all work together to provide you with the care you need, when you need it.  We recommend signing up for the patient portal called "MyChart".  Sign up information is provided on this After Visit Summary.  MyChart is used to connect with patients for Virtual Visits (Telemedicine).  Patients are able to view lab/test results, encounter notes, upcoming appointments, etc.  Non-urgent messages can be sent to your provider as well.   To learn more about what you can do with MyChart, go to [email or web address].    Your next appointment:   1 year(s)  The format for your next appointment:   In Person  Provider:   Mark Skains, MD   Thank you for choosing [facility]!!    

## 2021-07-18 NOTE — Assessment & Plan Note (Signed)
As above LIMA

## 2021-08-10 DIAGNOSIS — M170 Bilateral primary osteoarthritis of knee: Secondary | ICD-10-CM | POA: Diagnosis not present

## 2021-08-12 ENCOUNTER — Other Ambulatory Visit: Payer: Self-pay

## 2021-08-12 ENCOUNTER — Inpatient Hospital Stay: Payer: Medicare Other | Attending: Oncology | Admitting: Oncology

## 2021-08-12 VITALS — BP 124/77 | HR 77 | Temp 97.8°F | Resp 18 | Ht 71.0 in | Wt 233.2 lb

## 2021-08-12 DIAGNOSIS — C211 Malignant neoplasm of anal canal: Secondary | ICD-10-CM

## 2021-08-12 DIAGNOSIS — Z923 Personal history of irradiation: Secondary | ICD-10-CM | POA: Insufficient documentation

## 2021-08-12 DIAGNOSIS — Z9221 Personal history of antineoplastic chemotherapy: Secondary | ICD-10-CM | POA: Insufficient documentation

## 2021-08-12 DIAGNOSIS — Z85048 Personal history of other malignant neoplasm of rectum, rectosigmoid junction, and anus: Secondary | ICD-10-CM | POA: Insufficient documentation

## 2021-08-12 NOTE — Progress Notes (Signed)
[place] OFFICE PROGRESS NOTE   Diagnosis: Anal cancer  INTERVAL HISTORY:   Kirk [name] returns as scheduled.  He feels well.  No complaint.  He is here today with his wife.  Objective:  Vital signs in last 24 hours:  Blood pressure 124/77, pulse 77, temperature 97.8 F (36.6 C), temperature source Oral, resp. rate 18, height 5\' 11"  (1.803 m), weight 233 lb 3.2 oz (105.8 kg), SpO2 100 %.    HEENT: Neck without mass Lymphatics: No cervical, supraclavicular, axillary, or inguinal nodes Resp: Lungs clear bilaterally Cardio: Regular rate and rhythm GI: No hepatosplenomegaly, no mass, nontender Rectal: No rectal mass, brown stool Vascular: No leg edema   Lab Results:  Lab Results  Component Value Date   WBC 8.4 01/08/2018   HGB 14.8 01/08/2018   HCT 44.6 01/08/2018   MCV 97.6 01/08/2018   PLT 183 01/08/2018   NEUTROABS 6.3 01/08/2018    CMP  Lab Results  Component Value Date   NA 140 01/08/2018   K 4.4 01/08/2018   CL 103 01/08/2018   CO2 27 01/08/2018   GLUCOSE 99 01/08/2018   BUN 12 01/08/2018   CREATININE 0.92 01/08/2018   CALCIUM 9.4 01/08/2018   PROT 7.2 01/08/2018   ALBUMIN 4.1 01/08/2018   AST 38 01/08/2018   ALT 44 01/08/2018   ALKPHOS 69 01/08/2018   BILITOT 0.8 01/08/2018   GFRNONAA >60 01/08/2018   GFRAA >60 01/08/2018     Medications: I have reviewed the patient's current medications.   Assessment/Plan: 1.Squamous cell carcinoma of the anal canal, HPV positive.   2. Staging CT/PET scan with tiny lung nodules, hypermetabolic retroperitoneal, right inguinal, and left supraclavicular nodes . FNA biopsy of a left supraclavicular lymph node on 05/03/2012 confirmed metastatic squamous cell carcinoma.   -Cycle 1 of 5-fluorouracil/mitomycin-C and concurrent radiation on 05/20/2012. Cycle 2 of 5-fluorouracil/mitomycin-C on 06/24/2012 with completion of radiation on 06/28/2012.   -Restaging PET scan 08/02/2012 with resolution of the  hypermetabolic anal primary and right inguinal lymph node. Persistent hypermetabolic supraclavicular and retroperitoneal nodes.   -cycle 1 of salvage Taxol/carboplatin chemotherapy on 08/30/2012 . Restaging CTs after 4 cycles of Taxol/carboplatin confirmed resolution of retroperitoneal lymphadenopathy and a decrease in the size of a previously noted left supraclavicular node. He completed cycle 6 of Taxol/carboplatin on 01/02/2013.   -A negative flexible sigmoidoscopy 04/01/2013   -Staging CT scans 10/09/2013 with stable small left supraclavicular node, no evidence of progressive metastatic disease   -Negative colonoscopy 03/19/2015 -Colonoscopy 05/02/2018-no evidence of recurrent anal cancer, multiple tubular adenomas removed from the colon -Colonoscopy 06/30/2019-no evidence of recurrent anal cancer, multiple tubular adenomas removed 3. Hypermetabolic activity at the right supraglottic larynx on the PET scan 04/17/2012 with associated soft tissue fullness status post a negative ENT evaluation by Dr. Constance Holster.   4. History of aortic stenosis-status post aortic valve replacement and coronary artery bypass surgery in November 2012.   5. Mucositis following cycle 1 chemotherapy. Resolved.   6. Skin breakdown at the perineum and penis secondary to chemotherapy and radiation. Resolved.   7. 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 Bone pain after Neulasta   9. Generalized seizure 09/24/2012-negative brain CT,? Etiology. He has been evaluated by neurology and was prescribed Vimpat.   10.history of Thrombocytopenia secondary chemotherapy  11. Peripheral neuropathy-predating chemotherapy   12. Erectile dysfunction-followed by Dr. Risa Grill      Disposition: Mr. Eyer remains in clinical remission from anal cancer.  He will return  for an office visit in 1 year.  Betsy Coder, MD  08/12/2021  12:26 PM

## 2021-10-05 DIAGNOSIS — H5231 Anisometropia: Secondary | ICD-10-CM | POA: Diagnosis not present

## 2021-11-15 DIAGNOSIS — M170 Bilateral primary osteoarthritis of knee: Secondary | ICD-10-CM | POA: Diagnosis not present

## 2021-12-15 DIAGNOSIS — M170 Bilateral primary osteoarthritis of knee: Secondary | ICD-10-CM | POA: Diagnosis not present

## 2021-12-22 DIAGNOSIS — M170 Bilateral primary osteoarthritis of knee: Secondary | ICD-10-CM | POA: Diagnosis not present

## 2021-12-29 DIAGNOSIS — M170 Bilateral primary osteoarthritis of knee: Secondary | ICD-10-CM | POA: Diagnosis not present

## 2022-01-27 DIAGNOSIS — S20361A Insect bite (nonvenomous) of right front wall of thorax, initial encounter: Secondary | ICD-10-CM | POA: Diagnosis not present

## 2022-01-27 DIAGNOSIS — R42 Dizziness and giddiness: Secondary | ICD-10-CM | POA: Diagnosis not present

## 2022-01-27 DIAGNOSIS — S30861A Insect bite (nonvenomous) of abdominal wall, initial encounter: Secondary | ICD-10-CM | POA: Diagnosis not present

## 2022-01-27 DIAGNOSIS — W57XXXA Bitten or stung by nonvenomous insect and other nonvenomous arthropods, initial encounter: Secondary | ICD-10-CM | POA: Diagnosis not present

## 2022-03-02 DIAGNOSIS — M170 Bilateral primary osteoarthritis of knee: Secondary | ICD-10-CM | POA: Diagnosis not present

## 2022-03-17 DIAGNOSIS — R0609 Other forms of dyspnea: Secondary | ICD-10-CM | POA: Diagnosis not present

## 2022-03-17 DIAGNOSIS — R059 Cough, unspecified: Secondary | ICD-10-CM | POA: Diagnosis not present

## 2022-03-17 DIAGNOSIS — R1110 Vomiting, unspecified: Secondary | ICD-10-CM | POA: Diagnosis not present

## 2022-03-17 DIAGNOSIS — J40 Bronchitis, not specified as acute or chronic: Secondary | ICD-10-CM | POA: Diagnosis not present

## 2022-03-21 DIAGNOSIS — H01004 Unspecified blepharitis left upper eyelid: Secondary | ICD-10-CM | POA: Diagnosis not present

## 2022-03-21 DIAGNOSIS — J40 Bronchitis, not specified as acute or chronic: Secondary | ICD-10-CM | POA: Diagnosis not present

## 2022-04-04 DIAGNOSIS — I82411 Acute embolism and thrombosis of right femoral vein: Secondary | ICD-10-CM | POA: Diagnosis not present

## 2022-04-04 DIAGNOSIS — K219 Gastro-esophageal reflux disease without esophagitis: Secondary | ICD-10-CM | POA: Diagnosis not present

## 2022-04-04 DIAGNOSIS — I82401 Acute embolism and thrombosis of unspecified deep veins of right lower extremity: Secondary | ICD-10-CM | POA: Diagnosis not present

## 2022-04-04 DIAGNOSIS — R2241 Localized swelling, mass and lump, right lower limb: Secondary | ICD-10-CM | POA: Diagnosis not present

## 2022-04-04 DIAGNOSIS — I10 Essential (primary) hypertension: Secondary | ICD-10-CM | POA: Diagnosis not present

## 2022-04-04 DIAGNOSIS — T79A21A Traumatic compartment syndrome of right lower extremity, initial encounter: Secondary | ICD-10-CM | POA: Diagnosis not present

## 2022-04-04 DIAGNOSIS — I82461 Acute embolism and thrombosis of right calf muscular vein: Secondary | ICD-10-CM | POA: Diagnosis not present

## 2022-04-04 DIAGNOSIS — Z7982 Long term (current) use of aspirin: Secondary | ICD-10-CM | POA: Diagnosis not present

## 2022-04-04 DIAGNOSIS — Z953 Presence of xenogenic heart valve: Secondary | ICD-10-CM | POA: Diagnosis not present

## 2022-04-04 DIAGNOSIS — I82431 Acute embolism and thrombosis of right popliteal vein: Secondary | ICD-10-CM | POA: Diagnosis not present

## 2022-04-04 DIAGNOSIS — R600 Localized edema: Secondary | ICD-10-CM | POA: Diagnosis not present

## 2022-04-10 DIAGNOSIS — I82411 Acute embolism and thrombosis of right femoral vein: Secondary | ICD-10-CM | POA: Diagnosis not present

## 2022-04-13 DIAGNOSIS — Z0000 Encounter for general adult medical examination without abnormal findings: Secondary | ICD-10-CM | POA: Diagnosis not present

## 2022-04-13 DIAGNOSIS — I129 Hypertensive chronic kidney disease with stage 1 through stage 4 chronic kidney disease, or unspecified chronic kidney disease: Secondary | ICD-10-CM | POA: Diagnosis not present

## 2022-04-13 DIAGNOSIS — E7800 Pure hypercholesterolemia, unspecified: Secondary | ICD-10-CM | POA: Diagnosis not present

## 2022-04-13 DIAGNOSIS — E1140 Type 2 diabetes mellitus with diabetic neuropathy, unspecified: Secondary | ICD-10-CM | POA: Diagnosis not present

## 2022-04-17 ENCOUNTER — Inpatient Hospital Stay: Payer: Medicare Other | Attending: Oncology | Admitting: Oncology

## 2022-04-17 VITALS — BP 127/79 | HR 86 | Temp 98.1°F | Resp 18 | Ht 71.0 in | Wt 228.4 lb

## 2022-04-17 DIAGNOSIS — I824Z1 Acute embolism and thrombosis of unspecified deep veins of right distal lower extremity: Secondary | ICD-10-CM

## 2022-04-17 DIAGNOSIS — Z85048 Personal history of other malignant neoplasm of rectum, rectosigmoid junction, and anus: Secondary | ICD-10-CM | POA: Diagnosis not present

## 2022-04-17 DIAGNOSIS — Z9221 Personal history of antineoplastic chemotherapy: Secondary | ICD-10-CM | POA: Diagnosis not present

## 2022-04-17 DIAGNOSIS — Z86718 Personal history of other venous thrombosis and embolism: Secondary | ICD-10-CM | POA: Insufficient documentation

## 2022-04-17 DIAGNOSIS — Z7901 Long term (current) use of anticoagulants: Secondary | ICD-10-CM | POA: Diagnosis not present

## 2022-04-17 DIAGNOSIS — Z923 Personal history of irradiation: Secondary | ICD-10-CM | POA: Diagnosis not present

## 2022-04-17 NOTE — Progress Notes (Signed)
[name] OFFICE PROGRESS NOTE   Diagnosis: Anal cancer, DVT  INTERVAL HISTORY:   Kirk [name] returns prior to a scheduled visit.  He developed swelling of the right lower leg when he went to the beach last month.  He noted the swelling the day he arrived at the beach.  He was seen in the emergency room the following day and diagnosed with a right lower extremity DVT (we do not have the Doppler report available today).  He was started on apixaban.  He continues apixaban.  The right leg edema has improved.  He reports feeling poorly for approximately 3 weeks prior to going to the beach.  He had a fever, cough, and malaise.  He was sedentary for 3 weeks.  No difficulty with bowel function.  No bleeding.  No previous history of venous thrombosis.  No family history of venous thrombosis.  He now feels well.  Good appetite. Objective:  Vital signs in last 24 hours:  Blood pressure 127/79, pulse 86, temperature 98.1 F (36.7 C), temperature source Oral, resp. rate 18, height '5\' 11"'$  (1.803 m), weight 228 lb 6.4 oz (103.6 kg), SpO2 95 %.    HEENT: Neck without mass Lymphatics: No cervical, supraclavicular, axillary, or inguinal nodes Resp: Decreased breath sounds with end inspiratory rhonchi at the left posterior base, no respiratory distress Cardio: Regular rate and rhythm GI: No hepatosplenomegaly Vascular: Edema throughout the right lower leg, no erythema or palpable cord, nontender Skin: Hyperpigmentation in a sun distribution Rectal: No external mass, rectum without mass, brown stool  Lab Results:  Lab Results  Component Value Date   WBC 8.4 01/08/2018   HGB 14.8 01/08/2018   HCT 44.6 01/08/2018   MCV 97.6 01/08/2018   PLT 183 01/08/2018   NEUTROABS 6.3 01/08/2018    CMP  Lab Results  Component Value Date   NA 140 01/08/2018   K 4.4 01/08/2018   CL 103 01/08/2018   CO2 27 01/08/2018   GLUCOSE 99 01/08/2018   BUN 12 01/08/2018   CREATININE 0.92  01/08/2018   CALCIUM 9.4 01/08/2018   PROT 7.2 01/08/2018   ALBUMIN 4.1 01/08/2018   AST 38 01/08/2018   ALT 44 01/08/2018   ALKPHOS 69 01/08/2018   BILITOT 0.8 01/08/2018   GFRNONAA >60 01/08/2018   GFRAA >60 01/08/2018    patient's current medications.   Assessment/Plan: 1.Squamous cell carcinoma of the anal canal, HPV positive.   - Staging CT/PET scan with tiny lung nodules, hypermetabolic retroperitoneal, right inguinal, and left supraclavicular nodes . FNA biopsy of a left supraclavicular lymph node on 05/03/2012 confirmed metastatic squamous cell carcinoma.   -Cycle 1 of 5-fluorouracil/mitomycin-C and concurrent radiation on 05/20/2012. Cycle 2 of 5-fluorouracil/mitomycin-C on 06/24/2012 with completion of radiation on 06/28/2012.   -Restaging PET scan 08/02/2012 with resolution of the hypermetabolic anal primary and right inguinal lymph node. Persistent hypermetabolic supraclavicular and retroperitoneal nodes.   -cycle 1 of salvage Taxol/carboplatin chemotherapy on 08/30/2012 . Restaging CTs after 4 cycles of Taxol/carboplatin confirmed resolution of retroperitoneal lymphadenopathy and a decrease in the size of a previously noted left supraclavicular node. He completed cycle 6 of Taxol/carboplatin on 01/02/2013.   -A negative flexible sigmoidoscopy 04/01/2013   -Staging CT scans 10/09/2013 with stable small left supraclavicular node, no evidence of progressive metastatic disease   -Negative colonoscopy 03/19/2015 -Colonoscopy 05/02/2018-no evidence of recurrent anal cancer, multiple tubular adenomas removed from the colon -Colonoscopy 06/30/2019-no evidence of recurrent anal cancer, multiple tubular adenomas removed 3. Hypermetabolic  activity at the right supraglottic larynx on the PET scan 04/17/2012 with associated soft tissue fullness status post a negative ENT evaluation by Dr. Constance [name].   4. History of aortic stenosis-status post aortic valve replacement and coronary artery bypass  surgery in November 2012.   5. Mucositis following cycle 1 chemotherapy. Resolved.   6. Skin breakdown at the perineum and penis secondary to chemotherapy and radiation. Resolved.   7. 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 Bone pain after Neulasta   9. Generalized seizure 09/24/2012-negative brain CT,? Etiology. He has been evaluated by neurology and was prescribed Vimpat.   10.history of Thrombocytopenia secondary chemotherapy  11. Peripheral neuropathy-predating chemotherapy   12. Erectile dysfunction-followed by Dr. Risa [name] 13.  Right leg DVT 03/28/2022      Disposition: Kirk [name] has a remote history of metastatic anal cancer.  He was diagnosed with a right lower extremity DVT in late July.  The DVT appears unprovoked, unless it was related to an upper respiratory infection and immobility over preceding 3 weeks.  He may have had COVID-19, but Kirk [name] reports a home test was negative.  He has no other known transient risk factor for venous thrombosis. Obesity and male sex are risk for recurrent venous thrombosis.  There is no evidence of recurrent anal cancer, but we decided to proceed with a restaging evaluation given the DVT, respiratory symptoms, and physical exam findings today.  He will be scheduled for a CT chest/PE study, and CT abdomen/pelvis this week.  Kirk [name] will return for an office visit, D-dimer, and hypercoagulation panel in late October.  We will contact him with results of the CT evaluation.  Kirk Coder, MD  04/17/2022  4:15 PM

## 2022-04-18 DIAGNOSIS — E291 Testicular hypofunction: Secondary | ICD-10-CM | POA: Diagnosis not present

## 2022-04-18 DIAGNOSIS — Z125 Encounter for screening for malignant neoplasm of prostate: Secondary | ICD-10-CM | POA: Diagnosis not present

## 2022-04-18 DIAGNOSIS — E1140 Type 2 diabetes mellitus with diabetic neuropathy, unspecified: Secondary | ICD-10-CM | POA: Diagnosis not present

## 2022-04-18 DIAGNOSIS — I129 Hypertensive chronic kidney disease with stage 1 through stage 4 chronic kidney disease, or unspecified chronic kidney disease: Secondary | ICD-10-CM | POA: Diagnosis not present

## 2022-04-18 DIAGNOSIS — E7800 Pure hypercholesterolemia, unspecified: Secondary | ICD-10-CM | POA: Diagnosis not present

## 2022-04-25 ENCOUNTER — Other Ambulatory Visit: Payer: Self-pay

## 2022-04-25 ENCOUNTER — Inpatient Hospital Stay (HOSPITAL_BASED_OUTPATIENT_CLINIC_OR_DEPARTMENT_OTHER)
Admission: EM | Admit: 2022-04-25 | Discharge: 2022-04-29 | DRG: 175 | Disposition: A | Discharge status: Home / Self Care | Payer: Medicare Other | Attending: Internal Medicine | Admitting: Internal Medicine

## 2022-04-25 ENCOUNTER — Inpatient Hospital Stay (HOSPITAL_BASED_OUTPATIENT_CLINIC_OR_DEPARTMENT_OTHER)
Admission: RE | Admit: 2022-04-25 | Discharge: 2022-04-25 | Disposition: A | Discharge status: Home / Self Care | Payer: Medicare Other | Source: Ambulatory Visit | Attending: Oncology | Admitting: Oncology

## 2022-04-25 ENCOUNTER — Encounter (HOSPITAL_COMMUNITY): Payer: Self-pay

## 2022-04-25 ENCOUNTER — Encounter (HOSPITAL_BASED_OUTPATIENT_CLINIC_OR_DEPARTMENT_OTHER): Payer: Self-pay

## 2022-04-25 DIAGNOSIS — Z79899 Other long term (current) drug therapy: Secondary | ICD-10-CM

## 2022-04-25 DIAGNOSIS — Z7982 Long term (current) use of aspirin: Secondary | ICD-10-CM | POA: Diagnosis not present

## 2022-04-25 DIAGNOSIS — Z7901 Long term (current) use of anticoagulants: Secondary | ICD-10-CM

## 2022-04-25 DIAGNOSIS — Z8249 Family history of ischemic heart disease and other diseases of the circulatory system: Secondary | ICD-10-CM

## 2022-04-25 DIAGNOSIS — I4891 Unspecified atrial fibrillation: Secondary | ICD-10-CM | POA: Diagnosis not present

## 2022-04-25 DIAGNOSIS — Z800 Family history of malignant neoplasm of digestive organs: Secondary | ICD-10-CM

## 2022-04-25 DIAGNOSIS — I10 Essential (primary) hypertension: Secondary | ICD-10-CM | POA: Diagnosis not present

## 2022-04-25 DIAGNOSIS — Z888 Allergy status to other drugs, medicaments and biological substances status: Secondary | ICD-10-CM

## 2022-04-25 DIAGNOSIS — E1151 Type 2 diabetes mellitus with diabetic peripheral angiopathy without gangrene: Secondary | ICD-10-CM | POA: Diagnosis present

## 2022-04-25 DIAGNOSIS — Z9221 Personal history of antineoplastic chemotherapy: Secondary | ICD-10-CM

## 2022-04-25 DIAGNOSIS — I824Z1 Acute embolism and thrombosis of unspecified deep veins of right distal lower extremity: Secondary | ICD-10-CM | POA: Insufficient documentation

## 2022-04-25 DIAGNOSIS — C210 Malignant neoplasm of anus, unspecified: Secondary | ICD-10-CM | POA: Diagnosis not present

## 2022-04-25 DIAGNOSIS — Z85048 Personal history of other malignant neoplasm of rectum, rectosigmoid junction, and anus: Secondary | ICD-10-CM | POA: Diagnosis present

## 2022-04-25 DIAGNOSIS — Z9109 Other allergy status, other than to drugs and biological substances: Secondary | ICD-10-CM | POA: Diagnosis not present

## 2022-04-25 DIAGNOSIS — N200 Calculus of kidney: Secondary | ICD-10-CM | POA: Diagnosis not present

## 2022-04-25 DIAGNOSIS — Z6833 Body mass index (BMI) 33.0-33.9, adult: Secondary | ICD-10-CM | POA: Diagnosis not present

## 2022-04-25 DIAGNOSIS — Z7984 Long term (current) use of oral hypoglycemic drugs: Secondary | ICD-10-CM | POA: Diagnosis not present

## 2022-04-25 DIAGNOSIS — M1990 Unspecified osteoarthritis, unspecified site: Secondary | ICD-10-CM | POA: Diagnosis present

## 2022-04-25 DIAGNOSIS — I2602 Saddle embolus of pulmonary artery with acute cor pulmonale: Secondary | ICD-10-CM | POA: Diagnosis not present

## 2022-04-25 DIAGNOSIS — R0602 Shortness of breath: Secondary | ICD-10-CM | POA: Diagnosis present

## 2022-04-25 DIAGNOSIS — Z951 Presence of aortocoronary bypass graft: Secondary | ICD-10-CM | POA: Diagnosis not present

## 2022-04-25 DIAGNOSIS — G40909 Epilepsy, unspecified, not intractable, without status epilepticus: Secondary | ICD-10-CM | POA: Diagnosis not present

## 2022-04-25 DIAGNOSIS — I2510 Atherosclerotic heart disease of native coronary artery without angina pectoris: Secondary | ICD-10-CM | POA: Diagnosis present

## 2022-04-25 DIAGNOSIS — I2692 Saddle embolus of pulmonary artery without acute cor pulmonale: Secondary | ICD-10-CM | POA: Diagnosis present

## 2022-04-25 DIAGNOSIS — I252 Old myocardial infarction: Secondary | ICD-10-CM

## 2022-04-25 DIAGNOSIS — N179 Acute kidney failure, unspecified: Secondary | ICD-10-CM | POA: Diagnosis not present

## 2022-04-25 DIAGNOSIS — Z952 Presence of prosthetic heart valve: Secondary | ICD-10-CM | POA: Diagnosis not present

## 2022-04-25 DIAGNOSIS — Z86718 Personal history of other venous thrombosis and embolism: Secondary | ICD-10-CM

## 2022-04-25 DIAGNOSIS — E669 Obesity, unspecified: Secondary | ICD-10-CM | POA: Diagnosis not present

## 2022-04-25 DIAGNOSIS — I77810 Thoracic aortic ectasia: Secondary | ICD-10-CM | POA: Diagnosis not present

## 2022-04-25 DIAGNOSIS — Z923 Personal history of irradiation: Secondary | ICD-10-CM

## 2022-04-25 DIAGNOSIS — Z885 Allergy status to narcotic agent status: Secondary | ICD-10-CM

## 2022-04-25 DIAGNOSIS — K5730 Diverticulosis of large intestine without perforation or abscess without bleeding: Secondary | ICD-10-CM | POA: Diagnosis not present

## 2022-04-25 DIAGNOSIS — I2699 Other pulmonary embolism without acute cor pulmonale: Secondary | ICD-10-CM | POA: Diagnosis not present

## 2022-04-25 DIAGNOSIS — E119 Type 2 diabetes mellitus without complications: Secondary | ICD-10-CM

## 2022-04-25 DIAGNOSIS — Z8616 Personal history of COVID-19: Secondary | ICD-10-CM

## 2022-04-25 DIAGNOSIS — I824Y1 Acute embolism and thrombosis of unspecified deep veins of right proximal lower extremity: Secondary | ICD-10-CM | POA: Diagnosis not present

## 2022-04-25 DIAGNOSIS — E7800 Pure hypercholesterolemia, unspecified: Secondary | ICD-10-CM | POA: Diagnosis not present

## 2022-04-25 DIAGNOSIS — Z981 Arthrodesis status: Secondary | ICD-10-CM

## 2022-04-25 DIAGNOSIS — I82401 Acute embolism and thrombosis of unspecified deep veins of right lower extremity: Secondary | ICD-10-CM

## 2022-04-25 LAB — CBC WITH DIFFERENTIAL/PLATELET
Abs Immature Granulocytes: 0.15 10*3/uL — ABNORMAL HIGH (ref 0.00–0.07)
Basophils Absolute: 0.1 10*3/uL (ref 0.0–0.1)
Basophils Relative: 1 %
Eosinophils Absolute: 0.1 10*3/uL (ref 0.0–0.5)
Eosinophils Relative: 2 %
HCT: 45.4 % (ref 39.0–52.0)
Hemoglobin: 14.6 g/dL (ref 13.0–17.0)
Immature Granulocytes: 2 %
Lymphocytes Relative: 16 %
Lymphs Abs: 1.3 10*3/uL (ref 0.7–4.0)
MCH: 31.9 pg (ref 26.0–34.0)
MCHC: 32.2 g/dL (ref 30.0–36.0)
MCV: 99.3 fL (ref 80.0–100.0)
Monocytes Absolute: 0.9 10*3/uL (ref 0.1–1.0)
Monocytes Relative: 11 %
Neutro Abs: 5.6 10*3/uL (ref 1.7–7.7)
Neutrophils Relative %: 68 %
Platelets: 180 10*3/uL (ref 150–400)
RBC: 4.57 MIL/uL (ref 4.22–5.81)
RDW: 15.3 % (ref 11.5–15.5)
WBC: 8.2 10*3/uL (ref 4.0–10.5)
nRBC: 0.4 % — ABNORMAL HIGH (ref 0.0–0.2)

## 2022-04-25 LAB — COMPREHENSIVE METABOLIC PANEL
ALT: 24 U/L (ref 0–44)
AST: 21 U/L (ref 15–41)
Albumin: 4.3 g/dL (ref 3.5–5.0)
Alkaline Phosphatase: 52 U/L (ref 38–126)
Anion gap: 10 (ref 5–15)
BUN: 17 mg/dL (ref 8–23)
CO2: 29 mmol/L (ref 22–32)
Calcium: 9.4 mg/dL (ref 8.9–10.3)
Chloride: 100 mmol/L (ref 98–111)
Creatinine, Ser: 1.54 mg/dL — ABNORMAL HIGH (ref 0.61–1.24)
GFR, Estimated: 47 mL/min — ABNORMAL LOW (ref >60)
Glucose, Bld: 101 mg/dL — ABNORMAL HIGH (ref 70–99)
Potassium: 4.4 mmol/L (ref 3.5–5.1)
Sodium: 139 mmol/L (ref 135–145)
Total Bilirubin: 0.5 mg/dL (ref 0.3–1.2)
Total Protein: 7.1 g/dL (ref 6.5–8.1)

## 2022-04-25 LAB — TROPONIN I (HIGH SENSITIVITY)
Troponin I (High Sensitivity): 17 ng/L (ref <18)
Troponin I (High Sensitivity): 16 ng/L (ref <18)

## 2022-04-25 MED ORDER — ORAL CARE MOUTH RINSE: 15.0000 mL | OROMUCOSAL | Status: DC | PRN

## 2022-04-25 MED ORDER — APIXABAN 2.5 MG PO TABS: 5.0000 mg | ORAL_TABLET | Freq: Two times a day (BID) | ORAL | Status: DC

## 2022-04-25 MED ORDER — IOHEXOL 350 MG/ML SOLN
100.0000 mL | Freq: Once | INTRAVENOUS | Status: AC | PRN
  Administered 2022-04-25: 80 mL via INTRAVENOUS

## 2022-04-25 MED ORDER — HEPARIN BOLUS VIA INFUSION
6000.0000 [IU] | Freq: Once | INTRAVENOUS | Status: AC
Start: 2022-04-25 — End: 2022-04-25
  Administered 2022-04-25: 6000 [IU] via INTRAVENOUS

## 2022-04-25 MED ORDER — HEPARIN (PORCINE) 25000 UT/250ML-% IV SOLN
1450.0000 [IU]/h | INTRAVENOUS | Status: AC
  Administered 2022-04-25: 1600 [IU]/h via INTRAVENOUS
  Administered 2022-04-26 – 2022-04-28 (×4): 1450 [IU]/h via INTRAVENOUS
  Filled 2022-04-25 (×5): qty 250

## 2022-04-25 MED ORDER — CHLORHEXIDINE GLUCONATE CLOTH 2 % EX PADS
6.0000 | MEDICATED_PAD | Freq: Every day | CUTANEOUS | Status: DC
  Administered 2022-04-26 – 2022-04-27 (×2): 6 via TOPICAL

## 2022-04-25 NOTE — ED Provider Notes (Cosign Needed Addendum)
[facility] EMERGENCY DEPT Provider Note   CSN: [id number] Arrival date & time: 04/25/22  1932     History  Chief Complaint  Patient presents with   Shortness of Breath    Kirk [name] is a 76 y.o. male.  Patient with history of previously treated anal cancer presents to the emergency department after having CT imaging of the chest, abdomen, and pelvis performed this evening which demonstrated large pulmonary embolism.  He is currently undergoing treatment for DVT.  He developed swelling of the right lower leg when he went to the beach last month.  He noted the swelling the day he arrived.  He sought care at the emergency department was diagnosed with right lower extremity DVT.  He was treated with apixaban which she continues to take twice a day.  Swelling is improving.  Unclear etiology of the DVT.  He reports feeling poorly for approximately 3 weeks prior to going to the beach.  He had a fever, cough, and malaise.  He was sedentary for 3 weeks.  He was seen at an urgent care for the symptoms in early July.  He has not had any chest pain.  He does report some shortness of breath, but not severe and has not been limiting his activities.  He has continued to cough, productive of yellow mucus at times.  No hemoptysis.       Home Medications Prior to Admission medications   Medication Sig Start Date End Date Taking? Authorizing Provider  amitriptyline (ELAVIL) 25 MG tablet Take 25 mg by mouth at bedtime as needed for sleep.    [provider]  aspirin EC 81 MG tablet Take 81 mg by mouth daily.    [provider]  Cholecalciferol 1000 UNITS tablet 2,000 Units. Take 185 mg by mouth daily.    [provider]  DULoxetine (CYMBALTA) 60 MG capsule Take 60 mg by mouth daily. 01/06/16   [provider]  FARXIGA 5 MG TABS tablet Take 5 mg by mouth daily. 03/23/22   [provider]  losartan (COZAAR) 50 MG tablet Take 1 tablet (50 mg total)  by mouth daily. 04/29/15   Jerline Pain, MD  metoprolol tartrate (LOPRESSOR) 25 MG tablet TAKE 1 TABLET BY MOUTH TWICE A DAY 04/29/15   Jerline Pain, MD  MYRBETRIQ 25 MG TB24 tablet Take 25 mg by mouth at bedtime.  05/28/19   [provider]  rosuvastatin (CRESTOR) 40 MG tablet Take 40 mg by mouth daily. 05/25/21   [provider]  Tetrahydrozoline HCl (VISINE OP) Place 1 drop into both eyes as needed. Visine S    [provider]  Vitamins-Lipotropics (LIPO-FLAVONOID PLUS) TABS Take 1 tablet by mouth 3 (three) times daily after meals.    [provider]  Zinc Sulfate (ZINC-220 PO) Take 300 mg by mouth daily.    [provider]      Allergies    Cat hair extract, Atorvastatin, Dust mite extract, Metformin, and Oxycodone    Review of Systems   Review of Systems  Physical Exam Updated Vital Signs BP 135/68   Pulse 82   Temp 98 F (36.7 C) (Oral)   Resp 17   Ht '5\' 11"'$  (1.803 m)   Wt 103.4 kg   SpO2 97%   BMI 31.80 kg/m   Physical Exam Vitals and nursing note reviewed.  Constitutional:      Appearance: He is well-developed. He is not diaphoretic.  HENT:  Head: Normocephalic and atraumatic.     Mouth/Throat:     Mouth: Mucous membranes are not dry.  Eyes:     Conjunctiva/sclera: Conjunctivae normal.  Neck:     Vascular: Normal carotid pulses. No carotid bruit or JVD.     Trachea: Trachea normal. No tracheal deviation.  Cardiovascular:     Rate and Rhythm: Normal rate and regular rhythm.     Pulses: No decreased pulses.          Radial pulses are 2+ on the right side and 2+ on the left side.     Heart sounds: Normal heart sounds, S1 normal and S2 normal. Heart sounds not distant. No murmur [name]. Pulmonary:     Effort: Pulmonary effort is normal. No respiratory distress.     Breath sounds: Normal breath sounds. No wheezing, rhonchi or rales.  Chest:     Chest wall: No tenderness.  Abdominal:     General: Bowel sounds are  normal.     Palpations: Abdomen is soft.     Tenderness: There is no abdominal tenderness. There is no guarding or rebound.  Musculoskeletal:     Cervical back: Normal range of motion and neck supple. No muscular tenderness.     Right lower leg: Edema present.     Left lower leg: No edema.  Skin:    General: Skin is warm and dry.     Coloration: Skin is not pale.  Neurological:     Mental Status: He is alert. Mental status is at baseline.  Psychiatric:        Mood and Affect: Mood normal.     ED Results / Procedures / Treatments   Labs (all labs ordered are listed, but only abnormal results are displayed) Labs Reviewed  CBC WITH DIFFERENTIAL/PLATELET - Abnormal; Notable for the following components:      Result Value   nRBC 0.4 (*)    Abs Immature Granulocytes 0.15 (*)    All other components within normal limits  COMPREHENSIVE METABOLIC PANEL - Abnormal; Notable for the following components:   Glucose, Bld 101 (*)    Creatinine, Ser 1.54 (*)    GFR, Estimated 47 (*)    All other components within normal limits  TROPONIN I (HIGH SENSITIVITY)    ED ECG REPORT   Date: 04/25/2022  Rate: 72  Rhythm: normal sinus rhythm  QRS Axis: normal  Intervals: normal  ST/T Wave abnormalities: nonspecific T wave changes  Conduction Disutrbances:none  Narrative Interpretation:   Old EKG Reviewed: changes noted  I have personally reviewed the EKG tracing and agree with the computerized printout as noted.  Radiology CT Abdomen Pelvis W Contrast  Addendum Date: 04/25/2022   ADDENDUM REPORT: 04/25/2022 20:07 ADDENDUM: Critical Value/emergent results were called by telephone on 04/25/2022 at 8:06 pm to provider Dr. Lorenso Courier, who verbally acknowledged these results. Electronically Signed   By: Yetta Glassman M.D.   On: 04/25/2022 20:07   Result Date: 04/25/2022 CLINICAL DATA:  History of DVT; monitor anal cancer; * Tracking Code: BO * EXAM: CT ANGIOGRAPHY CHEST CT ABDOMEN AND PELVIS WITH  CONTRAST TECHNIQUE: Multidetector CT imaging of the chest was performed using the standard protocol during bolus administration of intravenous contrast. Multiplanar CT image reconstructions and MIPs were obtained to evaluate the vascular anatomy. Multidetector CT imaging of the abdomen and pelvis was performed using the standard protocol during bolus administration of intravenous contrast. RADIATION DOSE REDUCTION: This exam was performed according to the departmental dose-optimization program which  includes automated exposure control, adjustment of the mA and/or kV according to patient size and/or use of iterative reconstruction technique. CONTRAST:  72m OMNIPAQUE IOHEXOL 350 MG/ML SOLN COMPARISON:  None Available. FINDINGS: CTA CHEST FINDINGS Cardiovascular: Saddle pulmonary embolus of the main pulmonary artery and left and right pulmonary arteries, and bilateral lobar arteries. Elevated RV to LV ratio, 1.4. Normal heart size. No pericardial effusion. Normal caliber thoracic aorta with moderate atherosclerotic disease. Prior aortic valve replacement. Mitral annular calcifications. Moderate left main and three-vessel coronary artery calcifications. Mediastinum/Nodes: Esophagus and thyroid are unremarkable. Calcified mediastinal lymph nodes, likely sequela prior granulomatous infection. No pathologically enlarged lymph nodes seen in the chest. Lungs/Pleura: Central airways are patent. Bibasilar atelectasis. Branching, tubular high attenuation structure of the left lower lobe measuring 13 x 7 mm on series 5, image 199, finding was present on prior 2014 exam but has increased in size. Musculoskeletal: Prior median sternotomy with intact sternal wires. No acute or significant osseous findings. Review of the MIP images confirms the above findings. CT ABDOMEN and PELVIS FINDINGS Hepatobiliary: No focal liver abnormality is seen. Cholelithiasis with no gallbladder wall thickening. Pancreas: Unremarkable. No pancreatic  ductal dilatation or surrounding inflammatory changes. Spleen: Normal in size without focal abnormality. Adrenals/Urinary Tract: Adrenal glands are unremarkable. No hydronephrosis. Punctate bilateral nonobstructing renal stones. Bladder is unremarkable. Stomach/Bowel: Stomach is within normal limits. Appendix appears normal. Mild diverticulosis. No evidence of bowel wall thickening, distention, or inflammatory changes. Vascular/Lymphatic: Nonocclusive filling defect of the right common femoral vein. Aortic atherosclerosis. No enlarged abdominal or pelvic lymph nodes. Reproductive: Prostate is unremarkable. Other: Small right-greater-than-left fat containing inguinal hernias. No abdominopelvic ascites. Musculoskeletal: No acute or significant osseous findings. Review of the MIP images confirms the above findings. IMPRESSION: CHEST CTA: 1. Acute saddle pulmonary embolus of the main pulmonary artery, left and right pulmonary arteries, and bilateral lobar arteries. 2. Calcifications are seen in the bilateral distal right and left pulmonary arteries, likely post thrombotic change related to prior pulmonary embolus. 3. Elevated RV to LV ratio, findings can be seen in the setting of right heart strain. Correlate with echocardiography. 4. Branching, tubular high attenuation structure of the left lower lobe, compatible with a bronchocele, finding was present on 2014 exam, but has increased in size. ABDOMEN AND PELVIS CT: 1. Nonocclusive filling defect of the right common femoral vein, compatible with history of DVT. 2. No evidence of metastatic disease in the abdomen or pelvis. 3. Aortic Atherosclerosis (ICD10-I70.0). Patient instructed by tech to go to the ER and on-call provider has been paged at the time of this dictation 04/25/2022 at 19:37. Addendum will be placed when communication is documented. Electronically Signed: By: LYetta GlassmanM.D. On: 04/25/2022 20:00   CT Angio Chest Pulmonary Embolism (PE) W or WO  Contrast  Addendum Date: 04/25/2022   ADDENDUM REPORT: 04/25/2022 20:07 ADDENDUM: Critical Value/emergent results were called by telephone on 04/25/2022 at 8:06 pm to provider Dr. DLorenso Courier who verbally acknowledged these results. Electronically Signed   By: LYetta GlassmanM.D.   On: 04/25/2022 20:07   Result Date: 04/25/2022 CLINICAL DATA:  History of DVT; monitor anal cancer; * Tracking Code: BO * EXAM: CT ANGIOGRAPHY CHEST CT ABDOMEN AND PELVIS WITH CONTRAST TECHNIQUE: Multidetector CT imaging of the chest was performed using the standard protocol during bolus administration of intravenous contrast. Multiplanar CT image reconstructions and MIPs were obtained to evaluate the vascular anatomy. Multidetector CT imaging of the abdomen and pelvis was performed using the standard protocol during bolus  administration of intravenous contrast. RADIATION DOSE REDUCTION: This exam was performed according to the departmental dose-optimization program which includes automated exposure control, adjustment of the mA and/or kV according to patient size and/or use of iterative reconstruction technique. CONTRAST:  24m OMNIPAQUE IOHEXOL 350 MG/ML SOLN COMPARISON:  None Available. FINDINGS: CTA CHEST FINDINGS Cardiovascular: Saddle pulmonary embolus of the main pulmonary artery and left and right pulmonary arteries, and bilateral lobar arteries. Elevated RV to LV ratio, 1.4. Normal heart size. No pericardial effusion. Normal caliber thoracic aorta with moderate atherosclerotic disease. Prior aortic valve replacement. Mitral annular calcifications. Moderate left main and three-vessel coronary artery calcifications. Mediastinum/Nodes: Esophagus and thyroid are unremarkable. Calcified mediastinal lymph nodes, likely sequela prior granulomatous infection. No pathologically enlarged lymph nodes seen in the chest. Lungs/Pleura: Central airways are patent. Bibasilar atelectasis. Branching, tubular high attenuation structure of the left  lower lobe measuring 13 x 7 mm on series 5, image 199, finding was present on prior 2014 exam but has increased in size. Musculoskeletal: Prior median sternotomy with intact sternal wires. No acute or significant osseous findings. Review of the MIP images confirms the above findings. CT ABDOMEN and PELVIS FINDINGS Hepatobiliary: No focal liver abnormality is seen. Cholelithiasis with no gallbladder wall thickening. Pancreas: Unremarkable. No pancreatic ductal dilatation or surrounding inflammatory changes. Spleen: Normal in size without focal abnormality. Adrenals/Urinary Tract: Adrenal glands are unremarkable. No hydronephrosis. Punctate bilateral nonobstructing renal stones. Bladder is unremarkable. Stomach/Bowel: Stomach is within normal limits. Appendix appears normal. Mild diverticulosis. No evidence of bowel wall thickening, distention, or inflammatory changes. Vascular/Lymphatic: Nonocclusive filling defect of the right common femoral vein. Aortic atherosclerosis. No enlarged abdominal or pelvic lymph nodes. Reproductive: Prostate is unremarkable. Other: Small right-greater-than-left fat containing inguinal hernias. No abdominopelvic ascites. Musculoskeletal: No acute or significant osseous findings. Review of the MIP images confirms the above findings. IMPRESSION: CHEST CTA: 1. Acute saddle pulmonary embolus of the main pulmonary artery, left and right pulmonary arteries, and bilateral lobar arteries. 2. Calcifications are seen in the bilateral distal right and left pulmonary arteries, likely post thrombotic change related to prior pulmonary embolus. 3. Elevated RV to LV ratio, findings can be seen in the setting of right heart strain. Correlate with echocardiography. 4. Branching, tubular high attenuation structure of the left lower lobe, compatible with a bronchocele, finding was present on 2014 exam, but has increased in size. ABDOMEN AND PELVIS CT: 1. Nonocclusive filling defect of the right common  femoral vein, compatible with history of DVT. 2. No evidence of metastatic disease in the abdomen or pelvis. 3. Aortic Atherosclerosis (ICD10-I70.0). Patient instructed by tech to go to the ER and on-call provider has been paged at the time of this dictation 04/25/2022 at 19:37. Addendum will be placed when communication is documented. Electronically Signed: By: LYetta GlassmanM.D. On: 04/25/2022 20:00    Procedures Procedures    Medications Ordered in ED Medications - No data to display  ED Course/ Medical Decision Making/ A&P    Patient seen and examined. History obtained directly from patient.  Reviewed hematology/oncology notes by Dr. SBenay Spice    Labs/EKG: Ordered CBC, CMP, troponin.  Imaging: Independently visualized and interpreted.  This included: CT of the chest which demonstrates saddle pulmonary embolism.  Per report, there is evidence of right heart strain.  Medications/Fluids: None ordered.  Patient is on oral anticoagulation is currently stable.  Will defer IV heparin at this time.  Most recent vital signs reviewed and are as follows: BP 120/71   Pulse 69  Temp 98 F (36.7 C) (Oral)   Resp (!) 23   Ht '5\' 11"'$  (1.803 m)   Wt 103.4 kg   SpO2 94%   BMI 31.80 kg/m   Initial impression: DVT/PE with signs of right heart strain.  Unclear when PE occurred as patient is remarkably asymptomatic given the size of his PE today.  9:15 PM Reassessment performed. Patient appears comfortable, watching TV.  Labs personally reviewed and interpreted including: CBC with normal white blood cell count, hemoglobin 14.6; CMP elevated creatinine compared to previous of 1.54, normal electrolytes, glucose 101; troponin high normal at 17.  Reviewed pertinent lab work and imaging with patient at bedside. Questions answered.   Most current vital signs reviewed and are as follows: BP 130/73   Pulse 74   Temp 98 F (36.7 C) (Oral)   Resp 14   Ht '5\' 11"'$  (1.803 m)   Wt 103.4 kg   SpO2  97%   BMI 31.80 kg/m   Plan: Admit to hospital.  We will call for admission.  Patient has been discussed with Dr. Tomi Bamberger.  I have ordered patient's nighttime dose of apixaban 5 mg.  9:45 PM I consulted with Dr. Bridgett Larsson of [facility] hospitalist who accepts patient for admission.  We discussed current anticoagulation plan.  Will obtain hematology recommendations.  I spoke with Dr. Lorenso Courier who recommended heparin bolus and infusion at this point given clot burden.  Also in case if patient needs other therapies while inpatient.  CRITICAL CARE Performed by: Carlisle Cater PA-C Total critical care time: 40 minutes Critical care time was exclusive of separately billable procedures and treating other patients. Critical care was necessary to treat or prevent imminent or life-threatening deterioration. Critical care was time spent personally by me on the following activities: development of treatment plan with patient and/or surrogate as well as nursing, discussions with consultants, evaluation of patient's response to treatment, examination of patient, obtaining history from patient or surrogate, ordering and performing treatments and interventions, ordering and review of laboratory studies, ordering and review of radiographic studies, pulse oximetry and re-evaluation of patient's condition.                           Medical Decision Making Amount and/or Complexity of Data Reviewed Labs: ordered.  Risk Decision regarding hospitalization.   Patient with recent diagnosis of DVT.  Being worked up by hematology and sent for CT, not really based on symptoms.  This showed saddle PE with right heart strain.  Troponin upper limit of normal.  EKG without ischemia.  Vital signs stable.  Final Clinical Impression(s) / ED Diagnoses Final diagnoses:  Acute saddle pulmonary embolism with acute cor pulmonale [facility])    Rx / DC Orders ED Discharge Orders     None         Carlisle Cater, PA-C 04/25/22 2146     Carlisle Cater, PA-C 04/25/22 2148    Dorie Rank, MD 04/26/22 1747

## 2022-04-25 NOTE — Progress Notes (Signed)
ANTICOAGULATION CONSULT NOTE - Initial Consult  Pharmacy Consult for heparin Indication: pulmonary embolus  Allergies  Allergen Reactions   Cat Hair Extract Shortness Of Breath and Swelling   Atorvastatin Other (See Comments)    Pt has muscle ache on Atorvastatin but IS ABLE TO TAKE LIPITOR without any issues   Dust Mite Extract Other (See Comments) and Swelling   Metformin Other (See Comments)    Diarrhea, muscle cramps   Oxycodone Other (See Comments)    Seizure PATIENT DOES NOT WANT ANY NARCOTICS    Patient Measurements: Height: '5\' 11"'$  (180.3 cm) Weight: 103.4 kg (228 lb) IBW/kg (Calculated) : 75.3 Heparin Dosing Weight: 97kg  Vital Signs: Temp: 98.3 F (36.8 C) (08/22 2205) Temp Source: Oral (08/22 2205) BP: 140/79 (08/22 2205) Pulse Rate: 72 (08/22 2205)  Labs: Recent Labs    04/25/22 2014  HGB 14.6  HCT 45.4  PLT 180  CREATININE 1.54*  TROPONINIHS 17    Estimated Creatinine Clearance: 51.5 mL/min (A) (by C-G formula based on SCr of 1.54 mg/dL (H)).   Medical History: Past Medical History:  Diagnosis Date   Allergy    Angina    6 wks. ago   Aortic stenosis    valve replacement   Arthritis    1995- cerv. 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   CHF (congestive heart failure) (HCC)    Coronary artery disease    aortic stenosis, CAD   Diabetes mellitus    Type 2   ED (erectile dysfunction)    ED (erectile dysfunction)    Elevated cholesterol    Heart murmur    no problems   History of kidney stones    passed, lithrotrispy and surgery   History of radiation therapy 05/20/12-06/28/12   anal cancer=54gy total dose   Hypertension    Kidney stones    Malignant neoplasm of anal canal ([place]) 05/12/2012   Malignant neoplasm of other sites of rectum, rectosigmoid junction, and anus 04/12/2012   Myocardial  infarction [facility]) 2012   Personal history of colonic polyps 03/20/2012   Rectal bleeding    Rectal mass 03/20/2012   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   [facility]    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 Of note patient undergoing treatment for DVT on apixaban prior to admit with recent fill earlier this month and patient reports compliance. Primary service is planning hematology consult and orders received to transition over to heparin for now.   Will need to follow aptt and heparin levels until the labs correlate.   Goal of Therapy:  Heparin level 0.3-0.7 units/ml aPTT 66-102 seconds Monitor platelets by anticoagulation protocol: Yes   Plan:  Give 6000 units bolus x 1 Start heparin infusion at 1600 units/hr Check anti-Xa level in 6-8 hours and daily while on heparin Continue to monitor H&H and platelets  FErin HearingPharmD., BCPS Clinical Pharmacist 04/25/2022 10:41 PM

## 2022-04-25 NOTE — ED Triage Notes (Signed)
POV, pt was dx with DVT a couple weeks ago, had outpt CTA performed and was sent over for new dx of multiple pulmonary embolisms. Pt alert and oriented x 4, ambulatory to room.

## 2022-04-25 NOTE — Progress Notes (Signed)
  [facility] will assume care on arrival to accepting facility. Until arrival, care as per EDP. However, [facility] available 24/7 for questions and assistance.   Nursing staff please page TRH Admits and Consults ([phone]) as soon as the patient arrives to the hospital.  Avanell Banwart, DO [facility]  

## 2022-04-26 ENCOUNTER — Encounter (HOSPITAL_COMMUNITY): Payer: Self-pay | Admitting: Internal Medicine

## 2022-04-26 ENCOUNTER — Other Ambulatory Visit: Payer: Self-pay

## 2022-04-26 DIAGNOSIS — I77810 Thoracic aortic ectasia: Secondary | ICD-10-CM | POA: Diagnosis present

## 2022-04-26 DIAGNOSIS — E669 Obesity, unspecified: Secondary | ICD-10-CM | POA: Diagnosis present

## 2022-04-26 DIAGNOSIS — I824Y1 Acute embolism and thrombosis of unspecified deep veins of right proximal lower extremity: Secondary | ICD-10-CM | POA: Diagnosis not present

## 2022-04-26 DIAGNOSIS — Z7984 Long term (current) use of oral hypoglycemic drugs: Secondary | ICD-10-CM | POA: Diagnosis not present

## 2022-04-26 DIAGNOSIS — Z86718 Personal history of other venous thrombosis and embolism: Secondary | ICD-10-CM | POA: Diagnosis not present

## 2022-04-26 DIAGNOSIS — E1151 Type 2 diabetes mellitus with diabetic peripheral angiopathy without gangrene: Secondary | ICD-10-CM | POA: Diagnosis present

## 2022-04-26 DIAGNOSIS — N1831 Chronic kidney disease, stage 3a: Secondary | ICD-10-CM | POA: Insufficient documentation

## 2022-04-26 DIAGNOSIS — E039 Hypothyroidism, unspecified: Secondary | ICD-10-CM | POA: Insufficient documentation

## 2022-04-26 DIAGNOSIS — Z9109 Other allergy status, other than to drugs and biological substances: Secondary | ICD-10-CM | POA: Diagnosis not present

## 2022-04-26 DIAGNOSIS — I82401 Acute embolism and thrombosis of unspecified deep veins of right lower extremity: Secondary | ICD-10-CM

## 2022-04-26 DIAGNOSIS — Z885 Allergy status to narcotic agent status: Secondary | ICD-10-CM | POA: Diagnosis not present

## 2022-04-26 DIAGNOSIS — I10 Essential (primary) hypertension: Secondary | ICD-10-CM | POA: Diagnosis present

## 2022-04-26 DIAGNOSIS — Z888 Allergy status to other drugs, medicaments and biological substances status: Secondary | ICD-10-CM | POA: Diagnosis not present

## 2022-04-26 DIAGNOSIS — I2602 Saddle embolus of pulmonary artery with acute cor pulmonale: Secondary | ICD-10-CM | POA: Diagnosis present

## 2022-04-26 DIAGNOSIS — I2510 Atherosclerotic heart disease of native coronary artery without angina pectoris: Secondary | ICD-10-CM | POA: Diagnosis present

## 2022-04-26 DIAGNOSIS — G40909 Epilepsy, unspecified, not intractable, without status epilepticus: Secondary | ICD-10-CM | POA: Diagnosis present

## 2022-04-26 DIAGNOSIS — M1990 Unspecified osteoarthritis, unspecified site: Secondary | ICD-10-CM | POA: Diagnosis present

## 2022-04-26 DIAGNOSIS — Z7901 Long term (current) use of anticoagulants: Secondary | ICD-10-CM | POA: Diagnosis not present

## 2022-04-26 DIAGNOSIS — Z6833 Body mass index (BMI) 33.0-33.9, adult: Secondary | ICD-10-CM | POA: Diagnosis not present

## 2022-04-26 DIAGNOSIS — I4891 Unspecified atrial fibrillation: Secondary | ICD-10-CM | POA: Diagnosis present

## 2022-04-26 DIAGNOSIS — I2692 Saddle embolus of pulmonary artery without acute cor pulmonale: Secondary | ICD-10-CM

## 2022-04-26 DIAGNOSIS — I2699 Other pulmonary embolism without acute cor pulmonale: Secondary | ICD-10-CM | POA: Diagnosis not present

## 2022-04-26 DIAGNOSIS — N179 Acute kidney failure, unspecified: Secondary | ICD-10-CM | POA: Diagnosis present

## 2022-04-26 DIAGNOSIS — E119 Type 2 diabetes mellitus without complications: Secondary | ICD-10-CM

## 2022-04-26 DIAGNOSIS — Z952 Presence of prosthetic heart valve: Secondary | ICD-10-CM | POA: Diagnosis not present

## 2022-04-26 DIAGNOSIS — E7800 Pure hypercholesterolemia, unspecified: Secondary | ICD-10-CM | POA: Diagnosis present

## 2022-04-26 DIAGNOSIS — Z7982 Long term (current) use of aspirin: Secondary | ICD-10-CM | POA: Diagnosis not present

## 2022-04-26 DIAGNOSIS — Z85048 Personal history of other malignant neoplasm of rectum, rectosigmoid junction, and anus: Secondary | ICD-10-CM | POA: Diagnosis not present

## 2022-04-26 DIAGNOSIS — Z951 Presence of aortocoronary bypass graft: Secondary | ICD-10-CM | POA: Diagnosis not present

## 2022-04-26 DIAGNOSIS — R0602 Shortness of breath: Secondary | ICD-10-CM | POA: Diagnosis present

## 2022-04-26 DIAGNOSIS — Z8616 Personal history of COVID-19: Secondary | ICD-10-CM | POA: Diagnosis not present

## 2022-04-26 DIAGNOSIS — Z79899 Other long term (current) drug therapy: Secondary | ICD-10-CM | POA: Diagnosis not present

## 2022-04-26 LAB — CBC WITH DIFFERENTIAL/PLATELET
Abs Immature Granulocytes: 0.16 10*3/uL — ABNORMAL HIGH (ref 0.00–0.07)
Basophils Absolute: 0.1 10*3/uL (ref 0.0–0.1)
Basophils Relative: 1 %
Eosinophils Absolute: 0.2 10*3/uL (ref 0.0–0.5)
Eosinophils Relative: 2 %
HCT: 41.9 % (ref 39.0–52.0)
Hemoglobin: 13.5 g/dL (ref 13.0–17.0)
Immature Granulocytes: 2 %
Lymphocytes Relative: 20 %
Lymphs Abs: 1.7 10*3/uL (ref 0.7–4.0)
MCH: 32.1 pg (ref 26.0–34.0)
MCHC: 32.2 g/dL (ref 30.0–36.0)
MCV: 99.5 fL (ref 80.0–100.0)
Monocytes Absolute: 0.8 10*3/uL (ref 0.1–1.0)
Monocytes Relative: 10 %
Neutro Abs: 5.3 10*3/uL (ref 1.7–7.7)
Neutrophils Relative %: 65 %
Platelets: 161 10*3/uL (ref 150–400)
RBC: 4.21 MIL/uL — ABNORMAL LOW (ref 4.22–5.81)
RDW: 15.3 % (ref 11.5–15.5)
WBC: 8.2 10*3/uL (ref 4.0–10.5)
nRBC: 0 % (ref 0.0–0.2)

## 2022-04-26 LAB — GLUCOSE, CAPILLARY
Glucose-Capillary: 120 mg/dL — ABNORMAL HIGH (ref 70–99)
Glucose-Capillary: 149 mg/dL — ABNORMAL HIGH (ref 70–99)
Glucose-Capillary: 133 mg/dL — ABNORMAL HIGH (ref 70–99)
Glucose-Capillary: 114 mg/dL — ABNORMAL HIGH (ref 70–99)

## 2022-04-26 LAB — COMPREHENSIVE METABOLIC PANEL
ALT: 24 U/L (ref 0–44)
AST: 23 U/L (ref 15–41)
Albumin: 3.7 g/dL (ref 3.5–5.0)
Alkaline Phosphatase: 46 U/L (ref 38–126)
Anion gap: 10 (ref 5–15)
BUN: 15 mg/dL (ref 8–23)
CO2: 23 mmol/L (ref 22–32)
Calcium: 8.9 mg/dL (ref 8.9–10.3)
Chloride: 106 mmol/L (ref 98–111)
Creatinine, Ser: 1.31 mg/dL — ABNORMAL HIGH (ref 0.61–1.24)
GFR, Estimated: 57 mL/min — ABNORMAL LOW (ref >60)
Glucose, Bld: 116 mg/dL — ABNORMAL HIGH (ref 70–99)
Potassium: 3.8 mmol/L (ref 3.5–5.1)
Sodium: 139 mmol/L (ref 135–145)
Total Bilirubin: 0.7 mg/dL (ref 0.3–1.2)
Total Protein: 6.4 g/dL — ABNORMAL LOW (ref 6.5–8.1)

## 2022-04-26 LAB — BRAIN NATRIURETIC PEPTIDE: B Natriuretic Peptide: 69.6 pg/mL (ref 0.0–100.0)

## 2022-04-26 LAB — APTT
aPTT: 125 seconds — ABNORMAL HIGH (ref 24–36)
aPTT: 83 seconds — ABNORMAL HIGH (ref 24–36)

## 2022-04-26 LAB — MRSA NEXT GEN BY PCR, NASAL: MRSA by PCR Next Gen: NOT DETECTED

## 2022-04-26 LAB — HEPARIN LEVEL (UNFRACTIONATED): Heparin Unfractionated: >1.1 IU/mL — ABNORMAL HIGH (ref 0.30–0.70)

## 2022-04-26 LAB — TROPONIN I (HIGH SENSITIVITY)
Troponin I (High Sensitivity): 16 ng/L (ref <18)
Troponin I (High Sensitivity): 13 ng/L (ref <18)

## 2022-04-26 LAB — HEMOGLOBIN A1C
Hgb A1c MFr Bld: 6.4 % — ABNORMAL HIGH (ref 4.8–5.6)
Mean Plasma Glucose: 136.98 mg/dL

## 2022-04-26 MED ORDER — ASPIRIN 81 MG PO TBEC
81.0000 mg | DELAYED_RELEASE_TABLET | Freq: Every day | ORAL | Status: DC
  Administered 2022-04-26 – 2022-04-29 (×4): 81 mg via ORAL
  Filled 2022-04-26 (×4): qty 1

## 2022-04-26 MED ORDER — ROSUVASTATIN CALCIUM 20 MG PO TABS
40.0000 mg | ORAL_TABLET | Freq: Every day | ORAL | Status: DC
Start: 2022-04-26 — End: 2022-04-29
  Administered 2022-04-26 – 2022-04-28 (×3): 40 mg via ORAL
  Filled 2022-04-26 (×3): qty 2

## 2022-04-26 MED ORDER — INSULIN ASPART 100 UNIT/ML IJ SOLN: 0.0000 [IU] | Freq: Every day | INTRAMUSCULAR | Status: DC

## 2022-04-26 MED ORDER — INSULIN ASPART 100 UNIT/ML IJ SOLN
0.0000 [IU] | Freq: Three times a day (TID) | INTRAMUSCULAR | Status: DC
  Administered 2022-04-26 – 2022-04-27 (×3): 1 [IU] via SUBCUTANEOUS

## 2022-04-26 MED ORDER — ACETAMINOPHEN 650 MG RE SUPP: 650.0000 mg | Freq: Four times a day (QID) | RECTAL | Status: DC | PRN

## 2022-04-26 MED ORDER — ALBUTEROL SULFATE (2.5 MG/3ML) 0.083% IN NEBU: 2.5000 mg | INHALATION_SOLUTION | RESPIRATORY_TRACT | Status: DC | PRN

## 2022-04-26 MED ORDER — SODIUM CHLORIDE 0.9 % IV SOLN: INTRAVENOUS | Status: DC

## 2022-04-26 MED ORDER — MIRABEGRON ER 25 MG PO TB24
25.0000 mg | ORAL_TABLET | Freq: Every day | ORAL | Status: DC
  Administered 2022-04-26 – 2022-04-28 (×3): 25 mg via ORAL
  Filled 2022-04-26 (×3): qty 1

## 2022-04-26 MED ORDER — ONDANSETRON HCL 4 MG PO TABS: 4.0000 mg | ORAL_TABLET | Freq: Four times a day (QID) | ORAL | Status: DC | PRN

## 2022-04-26 MED ORDER — ONDANSETRON HCL 4 MG/2ML IJ SOLN: 4.0000 mg | Freq: Four times a day (QID) | INTRAMUSCULAR | Status: DC | PRN

## 2022-04-26 MED ORDER — ACETAMINOPHEN 325 MG PO TABS: 650.0000 mg | ORAL_TABLET | Freq: Four times a day (QID) | ORAL | Status: DC | PRN

## 2022-04-26 MED ORDER — DULOXETINE HCL 60 MG PO CPEP
60.0000 mg | ORAL_CAPSULE | Freq: Every day | ORAL | Status: DC
  Administered 2022-04-26 – 2022-04-28 (×3): 60 mg via ORAL
  Filled 2022-04-26 (×2): qty 2
  Filled 2022-04-26: qty 1

## 2022-04-26 MED ORDER — METOPROLOL TARTRATE 25 MG PO TABS
25.0000 mg | ORAL_TABLET | Freq: Two times a day (BID) | ORAL | Status: DC
  Administered 2022-04-26 – 2022-04-29 (×7): 25 mg via ORAL
  Filled 2022-04-26 (×7): qty 1

## 2022-04-26 NOTE — Progress Notes (Signed)
ANTICOAGULATION CONSULT NOTE - Consult  Pharmacy Consult for heparin Indication: pulmonary embolus  Allergies  Allergen Reactions   Cat Hair Extract Shortness Of Breath and Swelling   Atorvastatin Other (See Comments)    Pt has muscle ache on Atorvastatin but IS ABLE TO TAKE LIPITOR without any issues   Dust Mite Extract Other (See Comments) and Swelling   Metformin Other (See Comments)    Diarrhea, muscle cramps   Oxycodone Other (See Comments)    Seizure PATIENT DOES NOT WANT ANY NARCOTICS   Semaglutide(0.25 Or 0.'5mg'$ -Dos) Nausea Only    Patient Measurements: Height: '5\' 11"'$  (180.3 cm) Weight: 108 kg (238 lb 1.6 oz) IBW/kg (Calculated) : 75.3 Heparin Dosing Weight: 97kg  Vital Signs: Temp: 97.4 F (36.3 C) (08/23 0800) Temp Source: Oral (08/23 0800) BP: 144/54 (08/23 0900) Pulse Rate: 83 (08/23 0900)  Labs: Recent Labs    04/25/22 2014 04/25/22 2203 04/26/22 0233 04/26/22 0750  HGB 14.6  --  13.5  --   HCT 45.4  --  41.9  --   PLT 180  --  161  --   APTT  --   --   --  125*  HEPARINUNFRC  --   --   --  >1.10*  CREATININE 1.54*  --  1.31*  --   TROPONINIHS '17 16 16 13     '$ Estimated Creatinine Clearance: 61.9 mL/min (A) (by C-G formula based on SCr of 1.31 mg/dL (H)).   Medical History: Past Medical History:  Diagnosis Date   Allergy    Angina    6 wks. ago   Aortic stenosis    valve replacement   Arthritis    1995- cerv. 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   CHF (congestive heart failure) (HCC)    Coronary artery disease    aortic stenosis, CAD   Diabetes mellitus    Type 2   ED (erectile dysfunction)    ED (erectile dysfunction)    Elevated cholesterol    Heart murmur    no problems   History of kidney stones    passed, lithrotrispy and surgery   History of radiation therapy  05/20/12-06/28/12   anal cancer=54gy total dose   Hypertension    Kidney stones    Malignant neoplasm of anal canal ([place]) 05/12/2012   Malignant neoplasm of other sites of rectum, rectosigmoid junction, and anus 04/12/2012   Myocardial infarction [facility]) 2012   Personal history of colonic polyps 03/20/2012   Rectal bleeding    Rectal mass 03/20/2012   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   [facility]    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 Of note patient undergoing treatment for DVT on apixaban prior to admit with recent fill earlier this month and patient reports compliance. Primary service is planning hematology consult and orders received to transition over to heparin for now.   Will need to follow aptt and heparin levels  until the labs correlate.    Today, 04/26/22 HL > 1.10, expected with recent apixaban use  aPTT is 125, elevated  CBC WNL SCr elevated at 1.31 mg/dl, improving, CrCl 62 ml/min  Will continue to use aPTT for monitoring until aPTT and HL correlate   Goal of Therapy:  Heparin level 0.3-0.7 units/ml aPTT 66-102 seconds Monitor platelets by anticoagulation protocol: Yes   Plan:  Decrease heparin to 1450 units/hr  Obtain aPTT 8 hours after rate change  Daily CBC, aPTT, HL  Monitor for signs and symptoms of bleeding    Royetta Asal, PharmD, BCPS 04/26/2022 9:13 AM

## 2022-04-26 NOTE — Progress Notes (Signed)
IP PROGRESS NOTE  Subjective:   Kirk [name] is well-known to me with a history of anal cancer and recent diagnosis of a right lower extremity DVT.  He was admitted yesterday after a CT of the chest revealed a saddle pulmonary embolus and a nonocclusive filling defect in the right common femoral vein.  Kirk [name] reports feeling completely well prior to being called with the CT result.  He had been fishing with his grandchildren yesterday.  No dyspnea or chest pain.  He reports being compliant with apixaban anticoagulation.  He repeated he has history of having a respiratory illness characterized by a productive cough and dyspnea and profound malaise for 3 weeks prior to being diagnosed with a DVT.  Right lower extremity edema has improved.  Objective: Vital signs in last 24 hours: Blood pressure 123/64, pulse (!) 44, temperature 98.2 F (36.8 C), temperature source Oral, resp. rate 19, height '5\' 11"'$  (1.803 m), weight 238 lb 1.6 oz (108 kg), SpO2 92 %.  Intake/Output from previous day: 08/22 0701 - 08/23 0700 In: 723 [I.V.:723] Out: 250 [Urine:250]  Physical Exam:  HEENT: No thrush or ulcers Lungs: Clear bilaterally, no respiratory distress Cardiac: Regular rate and rhythm Abdomen: No hepatosplenomegaly Extremities: Trace-1+ edema throughout the right leg  Lab Results: Recent Labs    04/25/22 2014 04/26/22 0233  WBC 8.2 8.2  HGB 14.6 13.5  HCT 45.4 41.9  PLT 180 161    BMET Recent Labs    04/25/22 2014 04/26/22 0233  NA 139 139  K 4.4 3.8  CL 100 106  CO2 29 23  GLUCOSE 101* 116*  BUN 17 15  CREATININE 1.54* 1.31*  CALCIUM 9.4 8.9    No results found for: "CEA1", "CEA", "CAN199", "CA125"  Studies/Results: CT Abdomen Pelvis W Contrast  Addendum Date: 04/25/2022   ADDENDUM REPORT: 04/25/2022 20:07 ADDENDUM: Critical Value/emergent results were called by telephone on 04/25/2022 at 8:06 pm to provider Dr. Lorenso Courier, who verbally acknowledged these results.  Electronically Signed   By: Yetta Glassman M.D.   On: 04/25/2022 20:07   Result Date: 04/25/2022 CLINICAL DATA:  History of DVT; monitor anal cancer; * Tracking Code: BO * EXAM: CT ANGIOGRAPHY CHEST CT ABDOMEN AND PELVIS WITH CONTRAST TECHNIQUE: Multidetector CT imaging of the chest was performed using the standard protocol during bolus administration of intravenous contrast. Multiplanar CT image reconstructions and MIPs were obtained to evaluate the vascular anatomy. Multidetector CT imaging of the abdomen and pelvis was performed using the standard protocol during bolus administration of intravenous contrast. RADIATION DOSE REDUCTION: This exam was performed according to the departmental dose-optimization program which includes automated exposure control, adjustment of the mA and/or kV according to patient size and/or use of iterative reconstruction technique. CONTRAST:  26m OMNIPAQUE IOHEXOL 350 MG/ML SOLN COMPARISON:  None Available. FINDINGS: CTA CHEST FINDINGS Cardiovascular: Saddle pulmonary embolus of the main pulmonary artery and left and right pulmonary arteries, and bilateral lobar arteries. Elevated RV to LV ratio, 1.4. Normal heart size. No pericardial effusion. Normal caliber thoracic aorta with moderate atherosclerotic disease. Prior aortic valve replacement. Mitral annular calcifications. Moderate left main and three-vessel coronary artery calcifications. Mediastinum/Nodes: Esophagus and thyroid are unremarkable. Calcified mediastinal lymph nodes, likely sequela prior granulomatous infection. No pathologically enlarged lymph nodes seen in the chest. Lungs/Pleura: Central airways are patent. Bibasilar atelectasis. Branching, tubular high attenuation structure of the left lower lobe measuring 13 x 7 mm on series 5, image 199, finding was present on prior 2014 exam but has  increased in size. Musculoskeletal: Prior median sternotomy with intact sternal wires. No acute or significant osseous  findings. Review of the MIP images confirms the above findings. CT ABDOMEN and PELVIS FINDINGS Hepatobiliary: No focal liver abnormality is seen. Cholelithiasis with no gallbladder wall thickening. Pancreas: Unremarkable. No pancreatic ductal dilatation or surrounding inflammatory changes. Spleen: Normal in size without focal abnormality. Adrenals/Urinary Tract: Adrenal glands are unremarkable. No hydronephrosis. Punctate bilateral nonobstructing renal stones. Bladder is unremarkable. Stomach/Bowel: Stomach is within normal limits. Appendix appears normal. Mild diverticulosis. No evidence of bowel wall thickening, distention, or inflammatory changes. Vascular/Lymphatic: Nonocclusive filling defect of the right common femoral vein. Aortic atherosclerosis. No enlarged abdominal or pelvic lymph nodes. Reproductive: Prostate is unremarkable. Other: Small right-greater-than-left fat containing inguinal hernias. No abdominopelvic ascites. Musculoskeletal: No acute or significant osseous findings. Review of the MIP images confirms the above findings. IMPRESSION: CHEST CTA: 1. Acute saddle pulmonary embolus of the main pulmonary artery, left and right pulmonary arteries, and bilateral lobar arteries. 2. Calcifications are seen in the bilateral distal right and left pulmonary arteries, likely post thrombotic change related to prior pulmonary embolus. 3. Elevated RV to LV ratio, findings can be seen in the setting of right heart strain. Correlate with echocardiography. 4. Branching, tubular high attenuation structure of the left lower lobe, compatible with a bronchocele, finding was present on 2014 exam, but has increased in size. ABDOMEN AND PELVIS CT: 1. Nonocclusive filling defect of the right common femoral vein, compatible with history of DVT. 2. No evidence of metastatic disease in the abdomen or pelvis. 3. Aortic Atherosclerosis (ICD10-I70.0). Patient instructed by tech to go to the ER and on-call provider has been  paged at the time of this dictation 04/25/2022 at 19:37. Addendum will be placed when communication is documented. Electronically Signed: By: Yetta Glassman M.D. On: 04/25/2022 20:00   CT Angio Chest Pulmonary Embolism (PE) W or WO Contrast  Addendum Date: 04/25/2022   ADDENDUM REPORT: 04/25/2022 20:07 ADDENDUM: Critical Value/emergent results were called by telephone on 04/25/2022 at 8:06 pm to provider Dr. Lorenso Courier, who verbally acknowledged these results. Electronically Signed   By: Yetta Glassman M.D.   On: 04/25/2022 20:07   Result Date: 04/25/2022 CLINICAL DATA:  History of DVT; monitor anal cancer; * Tracking Code: BO * EXAM: CT ANGIOGRAPHY CHEST CT ABDOMEN AND PELVIS WITH CONTRAST TECHNIQUE: Multidetector CT imaging of the chest was performed using the standard protocol during bolus administration of intravenous contrast. Multiplanar CT image reconstructions and MIPs were obtained to evaluate the vascular anatomy. Multidetector CT imaging of the abdomen and pelvis was performed using the standard protocol during bolus administration of intravenous contrast. RADIATION DOSE REDUCTION: This exam was performed according to the departmental dose-optimization program which includes automated exposure control, adjustment of the mA and/or kV according to patient size and/or use of iterative reconstruction technique. CONTRAST:  34m OMNIPAQUE IOHEXOL 350 MG/ML SOLN COMPARISON:  None Available. FINDINGS: CTA CHEST FINDINGS Cardiovascular: Saddle pulmonary embolus of the main pulmonary artery and left and right pulmonary arteries, and bilateral lobar arteries. Elevated RV to LV ratio, 1.4. Normal heart size. No pericardial effusion. Normal caliber thoracic aorta with moderate atherosclerotic disease. Prior aortic valve replacement. Mitral annular calcifications. Moderate left main and three-vessel coronary artery calcifications. Mediastinum/Nodes: Esophagus and thyroid are unremarkable. Calcified mediastinal  lymph nodes, likely sequela prior granulomatous infection. No pathologically enlarged lymph nodes seen in the chest. Lungs/Pleura: Central airways are patent. Bibasilar atelectasis. Branching, tubular high attenuation structure of the left lower lobe  measuring 13 x 7 mm on series 5, image 199, finding was present on prior 2014 exam but has increased in size. Musculoskeletal: Prior median sternotomy with intact sternal wires. No acute or significant osseous findings. Review of the MIP images confirms the above findings. CT ABDOMEN and PELVIS FINDINGS Hepatobiliary: No focal liver abnormality is seen. Cholelithiasis with no gallbladder wall thickening. Pancreas: Unremarkable. No pancreatic ductal dilatation or surrounding inflammatory changes. Spleen: Normal in size without focal abnormality. Adrenals/Urinary Tract: Adrenal glands are unremarkable. No hydronephrosis. Punctate bilateral nonobstructing renal stones. Bladder is unremarkable. Stomach/Bowel: Stomach is within normal limits. Appendix appears normal. Mild diverticulosis. No evidence of bowel wall thickening, distention, or inflammatory changes. Vascular/Lymphatic: Nonocclusive filling defect of the right common femoral vein. Aortic atherosclerosis. No enlarged abdominal or pelvic lymph nodes. Reproductive: Prostate is unremarkable. Other: Small right-greater-than-left fat containing inguinal hernias. No abdominopelvic ascites. Musculoskeletal: No acute or significant osseous findings. Review of the MIP images confirms the above findings. IMPRESSION: CHEST CTA: 1. Acute saddle pulmonary embolus of the main pulmonary artery, left and right pulmonary arteries, and bilateral lobar arteries. 2. Calcifications are seen in the bilateral distal right and left pulmonary arteries, likely post thrombotic change related to prior pulmonary embolus. 3. Elevated RV to LV ratio, findings can be seen in the setting of right heart strain. Correlate with echocardiography. 4.  Branching, tubular high attenuation structure of the left lower lobe, compatible with a bronchocele, finding was present on 2014 exam, but has increased in size. ABDOMEN AND PELVIS CT: 1. Nonocclusive filling defect of the right common femoral vein, compatible with history of DVT. 2. No evidence of metastatic disease in the abdomen or pelvis. 3. Aortic Atherosclerosis (ICD10-I70.0). Patient instructed by tech to go to the ER and on-call provider has been paged at the time of this dictation 04/25/2022 at 19:37. Addendum will be placed when communication is documented. Electronically Signed: By: Yetta Glassman M.D. On: 04/25/2022 20:00    Medications: I have reviewed the patient's current medications.  Assessment/Plan:  1.Squamous cell carcinoma of the anal canal, HPV positive.   - Staging CT/PET scan with tiny lung nodules, hypermetabolic retroperitoneal, right inguinal, and left supraclavicular nodes . FNA biopsy of a left supraclavicular lymph node on 05/03/2012 confirmed metastatic squamous cell carcinoma.   -Cycle 1 of 5-fluorouracil/mitomycin-C and concurrent radiation on 05/20/2012. Cycle 2 of 5-fluorouracil/mitomycin-C on 06/24/2012 with completion of radiation on 06/28/2012.   -Restaging PET scan 08/02/2012 with resolution of the hypermetabolic anal primary and right inguinal lymph node. Persistent hypermetabolic supraclavicular and retroperitoneal nodes.   -cycle 1 of salvage Taxol/carboplatin chemotherapy on 08/30/2012 . Restaging CTs after 4 cycles of Taxol/carboplatin confirmed resolution of retroperitoneal lymphadenopathy and a decrease in the size of a previously noted left supraclavicular node. He completed cycle 6 of Taxol/carboplatin on 01/02/2013.   -A negative flexible sigmoidoscopy 04/01/2013   -Staging CT scans 10/09/2013 with stable small left supraclavicular node, no evidence of progressive metastatic disease   -Negative colonoscopy 03/19/2015 -Colonoscopy 05/02/2018-no evidence  of recurrent anal cancer, multiple tubular adenomas removed from the colon -Colonoscopy 06/30/2019-no evidence of recurrent anal cancer, multiple tubular adenomas removed 3. Hypermetabolic activity at the right supraglottic larynx on the PET scan 04/17/2012 with associated soft tissue fullness status post a negative ENT evaluation by Dr. Constance Holster.   4. History of aortic stenosis-status post aortic valve replacement and coronary artery bypass surgery in November 2012.   5. Mucositis following cycle 1 chemotherapy. Resolved.   6. Skin breakdown at the perineum  and penis secondary to chemotherapy and radiation. Resolved.   7. 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 Bone pain after Neulasta   9. Generalized seizure 09/24/2012-negative brain CT,? Etiology. He has been evaluated by neurology and was prescribed Vimpat.   10.history of Thrombocytopenia secondary chemotherapy  11. Peripheral neuropathy-predating chemotherapy   12. Erectile dysfunction-followed by Dr. Risa Grill 13.  Right leg DVT 03/28/2022-apixaban anticoagulation, pulmonary embolism on CT 04/25/2022 CT chest 04/25/2022-nonocclusive filling defect of the right common femoral vein, acute saddle pulmonary embolus of the main pulmonary artery, left and right pulmonary arteries, and bilateral lateral lobar arteries Admission 04/26/2022-heparin anticoagulation  Kirk [name] has a remote history of metastatic anal cancer.  He has remained in clinical remission after completing treatment with Taxol/carboplatin chemotherapy in the spring 2014.  There is no clinical or radiologic evidence of recurrent anal cancer.  Kirk [name] was diagnosed with a right lower extremity DVT while [place] the beach on 03/28/2022.  He began apixaban anticoagulation.  I referred him for CTs restaging CTs due to the history of metastatic anal cancer.  The DVT was discovered following a prolonged respiratory illness with immobility.  He reports negative home testing for  COVID-19.  He now has CT evidence of pulmonary embolism.  It is unclear whether the pulmonary embolism occurred prior to or following the DVT diagnosis.  It is also unclear to me whether he should be considered to have failed apixaban.  It is possible the respiratory symptoms he experienced last month were related to pulmonary embolism.  I agree with continuing heparin anticoagulation.  I agree with consulting pulmonary medicine to discuss whether the current CT findings could be related to pulmonary emboli that occurred several weeks ago.   Recommendations: Continue therapeutic heparin anticoagulation I will discuss continuing apixaban versus switching to a different anticoagulant with the pulmonary medicine service   LOS: 0 days   Betsy Coder, MD   04/26/2022, 1:28 PM

## 2022-04-26 NOTE — TOC Initial Note (Signed)
Transition of Care [facility]) - Initial/Assessment Note    Patient Details  Name: Kirk [name] MRN: [id number] Date of Birth: Apr 16, 1946  Transition of Care [facility]) CM/SW Contact:    Leeroy Cha, RN Phone Number: 04/26/2022, 7:52 AM  Clinical Narrative:                  Transition of Care [facility]) Screening Note   Patient Details  Name: Kirk [name] Date of Birth: 05/27/1946   Transition of Care [facility]) CM/SW Contact:    Leeroy Cha, RN Phone Number: 04/26/2022, 7:52 AM    Transition of Care Department [facility]) has reviewed patient and no TOC needs have been identified at this time. We will continue to monitor patient advancement through interdisciplinary progression rounds. If new patient transition needs arise, please place a TOC consult.      Barriers to Discharge: Continued Medical Work up   Patient Goals and CMS Choice Patient states their goals for this hospitalization and ongoing recovery are:: to go back to my home CMS Medicare.gov Compare Post Acute Care list provided to:: Patient    Expected Discharge Plan and Services     Discharge Planning Services: CM Consult   Living arrangements for the past 2 months: Single Family Home                                      Prior Living Arrangements/Services Living arrangements for the past 2 months: Single Family Home Lives with:: Spouse Patient language and need for interpreter reviewed:: Yes Do you feel safe going back to the place where you live?: Yes            Criminal Activity/Legal Involvement Pertinent to Current Situation/Hospitalization: No - Comment as needed  Activities of Daily Living Home Assistive Devices/Equipment: None ADL Screening (condition at time of admission) Patient's cognitive ability adequate to safely complete daily activities?: Yes Is the patient deaf or have difficulty hearing?: No Does the patient have difficulty seeing, even when wearing glasses/contacts?: No Does  the patient have difficulty concentrating, remembering, or making decisions?: No Patient able to express need for assistance with ADLs?: Yes Does the patient have difficulty dressing or bathing?: No Independently performs ADLs?: Yes (appropriate for developmental age) Does the patient have difficulty walking or climbing stairs?: No Weakness of Legs: None Weakness of Arms/Hands: None  Permission Sought/Granted                  Emotional Assessment Appearance:: Appears stated age Attitude/Demeanor/Rapport: Engaged Affect (typically observed): Calm Orientation: : Oriented to Self, Oriented to Place, Oriented to  Time, Oriented to Situation Alcohol / Substance Use: Not Applicable Psych Involvement: No (comment)  Admission diagnosis:  Saddle pulmonary embolus ([place]) [I26.92] Acute saddle pulmonary embolism with acute cor pulmonale (HCC) [I26.02] Patient Active Problem List   Diagnosis Date Noted   Right leg DVT ([place]) 04/26/2022   Chronic kidney disease, stage 3a ([place]) 04/26/2022   Essential hypertension 04/26/2022   Hypothyroidism 04/26/2022   AKI (acute kidney injury) ([place]) 04/26/2022   Type 2 diabetes mellitus ([place]) 04/26/2022   Saddle pulmonary embolus ([place]) 04/25/2022   Cervical myelopathy ([place]) 01/22/2018   Seizures ([place]) 10/13/2013   Obesity 08/11/2013   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   Malignant neoplasm of anal canal ([place]) 05/12/2012   History of anal cancer 04/12/2012   Cancer ([place]) 04/09/2012  Rectal mass 03/20/2012   Personal history of colonic polyps 03/20/2012   Atrial fibrillation ([place]) 08/20/2011   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   Coronary artery disease involving native coronary artery of native heart without angina pectoris 08/07/2011   Aortic stenosis 07/31/2011   Elevated cholesterol    ED (erectile dysfunction)    Kidney stones    PCP:  Maury Dus,  MD Pharmacy:   [facility] ([place]), [place] - [address] [place] [address] Phone: [phone] Fax: [place], [place] - [place] [address] [place] [address] Phone: [phone] Fax: [phone]     Social Determinants of Health (SDOH) Interventions    Readmission Risk Interventions   No data to display

## 2022-04-26 NOTE — Progress Notes (Signed)
PROGRESS NOTE   Kirk [name]  ZOX:[id number]    DOB: 1946-01-20    DOA: 04/25/2022  PCP: Maury Dus, MD   I have briefly reviewed patients previous [facility].  Chief Complaint  Patient presents with   Shortness of Breath    Brief Narrative:  76 year old married male, PMH of remote history of metastatic anal cancer, diagnosed with a right lower extremity DVT in late July, possibly unprovoked,?  COVID-19 infection, was placed on Eliquis and reports compliance except missing dose for 1 day, aortic stenosis s/p valve replacement, CAD s/p CABG, postop A-fib, type II DM, HLD, HTN, seizures, seen by oncology on 8/14 and given DVT, cancer restaging CT study were ordered, underwent CTA chest and CT A/P on 8/22 which confirmed acute saddle PE and was advised by oncologist to present to the ED.  Admitted to stepdown.  Initiated on IV heparin drip.  Medical oncology and PCCM consulted.   Assessment & Plan:  Principal Problem:   Saddle pulmonary embolus (HCC) Active Problems:   Coronary artery disease involving native coronary artery of native heart without angina pectoris   History of anal cancer   Right leg DVT (HCC)   Essential hypertension   AKI (acute kidney injury) ([place])   Type 2 diabetes mellitus ([place])   Acute saddle pulmonary embolus/RLE DVT: RLE DVT diagnosed end of July 2023, started on Eliquis, claims compliance except for missing dose on 1 day, denies any new cardiorespiratory symptoms, medical oncologist obtained CTA chest and CT A/P for cancer staging which revealed the acute saddle PE of the main pulmonary artery, left and right pulmonary arteries, and bilateral lobar arteries, possible right heart strain and nonocclusive filling defect of the right common femoral vein compatible with history of DVT.  No metastatic disease in abdomen or pelvis.  Hemodynamically stable.  Not hypoxic.  As per discussion with Dr. Benay Spice, medical oncology, does not suspect  Eliquis failure and feels that the PE may have been present at the same time DVT was diagnosed.  PCCM consulted to see if he would be a candidate for any aggressive intervention i.e. thrombectomy or thrombolysis.  2D echo pending.  HS Troponin x3 and BMP negative.  Acute kidney injury: Baseline creatinine 1.0, presented with creatinine of 1.5.  Brief IV fluid hydration, creatinine down to 1.31.  Continue to trend BMP daily.  Avoid nephrotoxic's.  Holding home losartan.  Remote history of metastatic anal cancer Treated 10 years ago and restaging CT without evidence of metastatic disease in the abdomen or pelvis. -Oncology follow-up. Dr. Benay Spice has seen today.   CAD, s/p CABG Stable. -Continue aspirin, statin, beta-blocker.  However once switching back to DOAC if that is done, will need to reconsider if aspirin need to be continued or stopped.   Non-insulin-dependent type 2 diabetes Well-controlled - A1c 6.4. -Sensitive sliding scale insulin ACHS   Hypertension Stable. -Continue metoprolol -Hold losartan given AKI  Body mass index is 33.21 kg/m.    DVT prophylaxis:      Code Status: Full Code:  Family Communication: Spouse at bedside Disposition:  Status is: Observation The patient will require care spanning > 2 midnights and should be moved to inpatient because: Saddle PE on IV heparin, need to closely monitor for decompensation, to be determined when to switch to oral anticoagulation.     Consultants:   Medical oncology PCCM  Procedures:     Antimicrobials:      Subjective:  Patient reports chronic dyspnea  on exertion, i.e. doing yard work, unchanged since prior.  Denies chest pain.  Reports having RLE pain less swelling couple weeks back which has significantly improved.  No other complaints or bleeding.  Objective:   Vitals:   04/26/22 0400 04/26/22 0500 04/26/22 0600 04/26/22 0736  BP: (!) 135/91 (!) 142/71 (!) 144/68   Pulse: 73 75 67 71  Resp: '16 18 19  14  '$ Temp: 97.8 F (36.6 C)     TempSrc: Oral     SpO2: 93% 95% 92% 94%  Weight:  108 kg    Height:        General exam: Elderly male, moderately built and obese lying comfortably supine in bed without distress. Respiratory system: Clear to auscultation. Respiratory effort normal. Cardiovascular system: S1 & S2 heard, RRR. No JVD, murmurs, rubs, gallops or clicks. No pedal edema.  Telemetry personally reviewed: Sinus rhythm. Gastrointestinal system: Abdomen is nondistended, soft and nontender. No organomegaly or masses felt. Normal bowel sounds heard. Central nervous system: Alert and oriented. No focal neurological deficits. Extremities: Symmetric 5 x 5 power.  Right leg mildly asymmetrically swollen compared to left but otherwise no acute findings.  Compartments soft. Skin: No rashes, lesions or ulcers Psychiatry: Judgement and insight appear normal. Mood & affect appropriate.     Data Reviewed:   I have personally reviewed following labs and imaging studies   CBC: Recent Labs  Lab 04/25/22 2014 04/26/22 0233  WBC 8.2 8.2  NEUTROABS 5.6 5.3  HGB 14.6 13.5  HCT 45.4 41.9  MCV 99.3 99.5  PLT 180 762    Basic Metabolic Panel: Recent Labs  Lab 04/25/22 2014 04/26/22 0233  NA 139 139  K 4.4 3.8  CL 100 106  CO2 29 23  GLUCOSE 101* 116*  BUN 17 15  CREATININE 1.54* 1.31*  CALCIUM 9.4 8.9    Liver Function Tests: Recent Labs  Lab 04/25/22 2014 04/26/22 0233  AST 21 23  ALT 24 24  ALKPHOS 52 46  BILITOT 0.5 0.7  PROT 7.1 6.4*  ALBUMIN 4.3 3.7    CBG: Recent Labs  Lab 04/26/22 0742  GLUCAP 120*    Microbiology Studies:   Recent Results (from the past 240 hour(s))  MRSA Next Gen by PCR, Nasal     Status: None   Collection Time: 04/25/22 10:59 PM   Specimen: Nasal Mucosa; Nasal Swab  Result Value Ref Range Status   MRSA by PCR Next Gen NOT DETECTED NOT DETECTED Final    Comment: (NOTE) The GeneXpert MRSA Assay (FDA approved for NASAL specimens  only), is one component of a comprehensive MRSA colonization surveillance program. It is not intended to diagnose MRSA infection nor to guide or monitor treatment for MRSA infections. Test performance is not FDA approved in patients less than 64 years old. Performed at [facility], [place] [address], [place], [place] [address]     Radiology Studies:  CT Abdomen Pelvis W Contrast  Addendum Date: 04/25/2022   ADDENDUM REPORT: 04/25/2022 20:07 ADDENDUM: Critical Value/emergent results were called by telephone on 04/25/2022 at 8:06 pm to provider Dr. Lorenso Courier, who verbally acknowledged these results. Electronically Signed   By: Yetta Glassman M.D.   On: 04/25/2022 20:07   Result Date: 04/25/2022 CLINICAL DATA:  History of DVT; monitor anal cancer; * Tracking Code: BO * EXAM: CT ANGIOGRAPHY CHEST CT ABDOMEN AND PELVIS WITH CONTRAST TECHNIQUE: Multidetector CT imaging of the chest was performed using the standard protocol during bolus administration of  intravenous contrast. Multiplanar CT image reconstructions and MIPs were obtained to evaluate the vascular anatomy. Multidetector CT imaging of the abdomen and pelvis was performed using the standard protocol during bolus administration of intravenous contrast. RADIATION DOSE REDUCTION: This exam was performed according to the departmental dose-optimization program which includes automated exposure control, adjustment of the mA and/or kV according to patient size and/or use of iterative reconstruction technique. CONTRAST:  77m OMNIPAQUE IOHEXOL 350 MG/ML SOLN COMPARISON:  None Available. FINDINGS: CTA CHEST FINDINGS Cardiovascular: Saddle pulmonary embolus of the main pulmonary artery and left and right pulmonary arteries, and bilateral lobar arteries. Elevated RV to LV ratio, 1.4. Normal heart size. No pericardial effusion. Normal caliber thoracic aorta with moderate atherosclerotic disease. Prior aortic valve replacement. Mitral annular  calcifications. Moderate left main and three-vessel coronary artery calcifications. Mediastinum/Nodes: Esophagus and thyroid are unremarkable. Calcified mediastinal lymph nodes, likely sequela prior granulomatous infection. No pathologically enlarged lymph nodes seen in the chest. Lungs/Pleura: Central airways are patent. Bibasilar atelectasis. Branching, tubular high attenuation structure of the left lower lobe measuring 13 x 7 mm on series 5, image 199, finding was present on prior 2014 exam but has increased in size. Musculoskeletal: Prior median sternotomy with intact sternal wires. No acute or significant osseous findings. Review of the MIP images confirms the above findings. CT ABDOMEN and PELVIS FINDINGS Hepatobiliary: No focal liver abnormality is seen. Cholelithiasis with no gallbladder wall thickening. Pancreas: Unremarkable. No pancreatic ductal dilatation or surrounding inflammatory changes. Spleen: Normal in size without focal abnormality. Adrenals/Urinary Tract: Adrenal glands are unremarkable. No hydronephrosis. Punctate bilateral nonobstructing renal stones. Bladder is unremarkable. Stomach/Bowel: Stomach is within normal limits. Appendix appears normal. Mild diverticulosis. No evidence of bowel wall thickening, distention, or inflammatory changes. Vascular/Lymphatic: Nonocclusive filling defect of the right common femoral vein. Aortic atherosclerosis. No enlarged abdominal or pelvic lymph nodes. Reproductive: Prostate is unremarkable. Other: Small right-greater-than-left fat containing inguinal hernias. No abdominopelvic ascites. Musculoskeletal: No acute or significant osseous findings. Review of the MIP images confirms the above findings. IMPRESSION: CHEST CTA: 1. Acute saddle pulmonary embolus of the main pulmonary artery, left and right pulmonary arteries, and bilateral lobar arteries. 2. Calcifications are seen in the bilateral distal right and left pulmonary arteries, likely post thrombotic  change related to prior pulmonary embolus. 3. Elevated RV to LV ratio, findings can be seen in the setting of right heart strain. Correlate with echocardiography. 4. Branching, tubular high attenuation structure of the left lower lobe, compatible with a bronchocele, finding was present on 2014 exam, but has increased in size. ABDOMEN AND PELVIS CT: 1. Nonocclusive filling defect of the right common femoral vein, compatible with history of DVT. 2. No evidence of metastatic disease in the abdomen or pelvis. 3. Aortic Atherosclerosis (ICD10-I70.0). Patient instructed by tech to go to the ER and on-call provider has been paged at the time of this dictation 04/25/2022 at 19:37. Addendum will be placed when communication is documented. Electronically Signed: By: LYetta GlassmanM.D. On: 04/25/2022 20:00   CT Angio Chest Pulmonary Embolism (PE) W or WO Contrast  Addendum Date: 04/25/2022   ADDENDUM REPORT: 04/25/2022 20:07 ADDENDUM: Critical Value/emergent results were called by telephone on 04/25/2022 at 8:06 pm to provider Dr. DLorenso Courier who verbally acknowledged these results. Electronically Signed   By: LYetta GlassmanM.D.   On: 04/25/2022 20:07   Result Date: 04/25/2022 CLINICAL DATA:  History of DVT; monitor anal cancer; * Tracking Code: BO * EXAM: CT ANGIOGRAPHY CHEST CT ABDOMEN AND PELVIS WITH  CONTRAST TECHNIQUE: Multidetector CT imaging of the chest was performed using the standard protocol during bolus administration of intravenous contrast. Multiplanar CT image reconstructions and MIPs were obtained to evaluate the vascular anatomy. Multidetector CT imaging of the abdomen and pelvis was performed using the standard protocol during bolus administration of intravenous contrast. RADIATION DOSE REDUCTION: This exam was performed according to the departmental dose-optimization program which includes automated exposure control, adjustment of the mA and/or kV according to patient size and/or use of iterative  reconstruction technique. CONTRAST:  11m OMNIPAQUE IOHEXOL 350 MG/ML SOLN COMPARISON:  None Available. FINDINGS: CTA CHEST FINDINGS Cardiovascular: Saddle pulmonary embolus of the main pulmonary artery and left and right pulmonary arteries, and bilateral lobar arteries. Elevated RV to LV ratio, 1.4. Normal heart size. No pericardial effusion. Normal caliber thoracic aorta with moderate atherosclerotic disease. Prior aortic valve replacement. Mitral annular calcifications. Moderate left main and three-vessel coronary artery calcifications. Mediastinum/Nodes: Esophagus and thyroid are unremarkable. Calcified mediastinal lymph nodes, likely sequela prior granulomatous infection. No pathologically enlarged lymph nodes seen in the chest. Lungs/Pleura: Central airways are patent. Bibasilar atelectasis. Branching, tubular high attenuation structure of the left lower lobe measuring 13 x 7 mm on series 5, image 199, finding was present on prior 2014 exam but has increased in size. Musculoskeletal: Prior median sternotomy with intact sternal wires. No acute or significant osseous findings. Review of the MIP images confirms the above findings. CT ABDOMEN and PELVIS FINDINGS Hepatobiliary: No focal liver abnormality is seen. Cholelithiasis with no gallbladder wall thickening. Pancreas: Unremarkable. No pancreatic ductal dilatation or surrounding inflammatory changes. Spleen: Normal in size without focal abnormality. Adrenals/Urinary Tract: Adrenal glands are unremarkable. No hydronephrosis. Punctate bilateral nonobstructing renal stones. Bladder is unremarkable. Stomach/Bowel: Stomach is within normal limits. Appendix appears normal. Mild diverticulosis. No evidence of bowel wall thickening, distention, or inflammatory changes. Vascular/Lymphatic: Nonocclusive filling defect of the right common femoral vein. Aortic atherosclerosis. No enlarged abdominal or pelvic lymph nodes. Reproductive: Prostate is unremarkable. Other:  Small right-greater-than-left fat containing inguinal hernias. No abdominopelvic ascites. Musculoskeletal: No acute or significant osseous findings. Review of the MIP images confirms the above findings. IMPRESSION: CHEST CTA: 1. Acute saddle pulmonary embolus of the main pulmonary artery, left and right pulmonary arteries, and bilateral lobar arteries. 2. Calcifications are seen in the bilateral distal right and left pulmonary arteries, likely post thrombotic change related to prior pulmonary embolus. 3. Elevated RV to LV ratio, findings can be seen in the setting of right heart strain. Correlate with echocardiography. 4. Branching, tubular high attenuation structure of the left lower lobe, compatible with a bronchocele, finding was present on 2014 exam, but has increased in size. ABDOMEN AND PELVIS CT: 1. Nonocclusive filling defect of the right common femoral vein, compatible with history of DVT. 2. No evidence of metastatic disease in the abdomen or pelvis. 3. Aortic Atherosclerosis (ICD10-I70.0). Patient instructed by tech to go to the ER and on-call provider has been paged at the time of this dictation 04/25/2022 at 19:37. Addendum will be placed when communication is documented. Electronically Signed: By: LYetta GlassmanM.D. On: 04/25/2022 20:00    Scheduled Meds:    aspirin EC  81 mg Oral Daily   Chlorhexidine Gluconate Cloth  6 each Topical Daily   DULoxetine  60 mg Oral QHS   insulin aspart  0-5 Units Subcutaneous QHS   insulin aspart  0-9 Units Subcutaneous TID WC   metoprolol tartrate  25 mg Oral BID   mirabegron ER  25 mg  Oral QHS   rosuvastatin  40 mg Oral QHS    Continuous Infusions:    sodium chloride 125 mL/hr at 04/26/22 0649   heparin 1,600 Units/hr (04/26/22 0649)     LOS: 0 days     Vernell Leep, MD,  FACP, [facility], [facility], [facility] (Care Management Physician Certified) [place]  To contact the attending provider between 7A-7P or the  covering provider during after hours 7P-7A, please log into the web site [email or web address] and access using universal [place] password for that web site. If you do not have the password, please call the hospital operator.  04/26/2022, 8:30 AM

## 2022-04-26 NOTE — H&P (Signed)
History and Physical    Kirk [name] WER:[id number] DOB: [birth year] DOA: 04/25/2022  PCP: Maury Dus, MD  Patient coming from: [facility]  Chief Complaint: Acute PE  HPI: Kirk [name] is a 76 y.o. 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  Patient went to the beach recently and noted swelling of his right lower extremity.  He was seen in the emergency room and diagnosed with right lower extremity DVT and started on Eliquis. Seen by heme-onc on 8/14 and had restaging imaging done yesterday.  CTA chest showing acute saddle PE of the main pulmonary artery, left and right pulmonary arteries, and bilateral lobar arteries with evidence of right heart strain (elevated RV to LV ratio).  CT abdomen pelvis showing nonocclusive filling defect of the right common femoral vein compatible with history of DVT.  No evidence of metastatic disease in the abdomen or pelvis.  Patient was sent to the ED for further evaluation. Vital signs stable.  Labs showing creatinine is 1.5 (baseline around 1.0), troponin negative x2. ED PA discussed the case with Dr. Lorenso Courier from oncology who recommended starting IV heparin for anticoagulation given clot burden.  History provided by patient and his wife at bedside.  Patient states he was having shortness of breath a month ago and was seen by his PCP and diagnosed with pneumonia and started on antibiotics.  Even after finishing antibiotics, his dyspnea persisted. He then went to the beach at the end of July/beginning of August and was having right lower extremity edema.  He went to an emergency room in that town and was diagnosed with right lower extremity DVT and started on Eliquis.  He has been taking Eliquis twice daily and has missed only 1 dose since the  beginning of the month.  Patient denies chest pain.  Denies personal or family history of blood clots.  Denies any recent surgeries.  He does report being more lethargic and sedentary for the past few months.  He reports history of anal cancer which was treated 10 years ago.  Review of Systems:  Review of Systems  All other systems reviewed and are negative.   Past Medical History:  Diagnosis Date   Allergy    Angina    6 wks. ago   Aortic stenosis    valve replacement   Arthritis    1995- cerv. 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   CHF (congestive heart failure) (HCC)    Coronary artery disease    aortic stenosis, CAD   Diabetes mellitus    Type 2   ED (erectile dysfunction)    ED (erectile dysfunction)    Elevated cholesterol    Heart murmur    no problems   History of kidney stones    passed, lithrotrispy and surgery   History of radiation therapy 05/20/12-06/28/12   anal cancer=54gy total dose   Hypertension    Kidney stones    Malignant neoplasm of anal canal ([place]) 05/12/2012   Malignant neoplasm of other sites of rectum, rectosigmoid junction, and anus 04/12/2012   Myocardial infarction [facility]) 2012   Personal history of colonic polyps 03/20/2012   Rectal bleeding  Rectal mass 03/20/2012   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   [facility]    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   LIMA to diagonal branch    [place]   [place]    after taking O[place]; not prescribed seizure med   Shortness of breath    Thrombosed hemorrhoids     Past Surgical History:  Procedure Laterality Date   ANAL FISSURE REPAIR     patient unsure of date   ANTERIOR CERVICAL DECOMP/DISCECTOMY FUSION N/A 01/22/2018   Procedure: ANTERIOR CERVICAL  DECOMPRESSION/DISCECTOMY FUSION CERVICAL SIX- CERVICAL SEVEN;  Surgeon: SErline Levine MD;  [place]  Service: Neurosurgery;  Laterality: N/A;  ANTERIOR CERVICAL DECOMPRESSION/DISCECTOMY FUSION CERVICAL SIX- CERVICAL SEVEN   AORTIC VALVE REPLACEMENT  08/11/2011   Procedure: AORTIC VALVE REPLACEMENT (AVR);  Surgeon: CRexene Alberts MD;  [place]  Service: Open Heart Surgery;  Laterality: N/A;   C4-6 FUSION  1995   DR STERN   COLONOSCOPY     hx polyps   CORONARY ARTERY BYPASS GRAFT  08/16/2011   Procedure: CORONARY ARTERY BYPASS GRAFTING (CABG);  Surgeon: CRexene Alberts MD;  [place]  Service: Open Heart Surgery;  Laterality: N/A;  CABG times one using left internal mammary artery   LITHOTRIPSY  2001 AND 2002   DR PETERSON   POLYPECTOMY     colon     reports that he has never smoked. He has never used smokeless tobacco. He reports that he does not currently use alcohol. He reports that he does not use drugs.  Allergies  Allergen Reactions   Cat Hair Extract Shortness Of Breath and Swelling   Atorvastatin Other (See Comments)    Pt has muscle ache on Atorvastatin but IS ABLE TO TAKE LIPITOR without any issues   Dust Mite Extract Other (See Comments) and Swelling   Metformin Other (See Comments)    Diarrhea, muscle cramps   Oxycodone Other (See Comments)    Seizure PATIENT DOES NOT WANT ANY NARCOTICS   Semaglutide(0.25 Or 0.'5mg'$ -Dos) Nausea Only    Family History  Problem Relation Age of Onset   Cancer Mother        breast   Heart attack Father    Esophageal cancer Maternal Grandfather    Anesthesia problems Neg Hx    Hypotension Neg Hx    Malignant hyperthermia Neg Hx    Pseudochol deficiency Neg Hx    Colon cancer Neg Hx    Colon polyps Neg Hx    Stomach cancer Neg Hx    Rectal cancer Neg Hx     Prior to Admission medications   Medication Sig Start Date End Date Taking? Authorizing Provider  amitriptyline (ELAVIL) 25 MG tablet Take 25 mg by  mouth at bedtime as needed for sleep.   Yes [provider]  aspirin EC 81 MG tablet Take 81 mg by mouth daily.   Yes [provider]  Cholecalciferol 1000 UNITS tablet Take 1,000 Units by mouth daily.   Yes [provider]  DULoxetine (CYMBALTA) 60 MG capsule Take 60 mg by mouth at bedtime. 01/06/16  Yes [provider]  ELIQUIS 5 MG TABS tablet Take 5 mg by mouth in the morning and at bedtime. 04/04/22  Yes [provider]  FARXIGA 5 MG TABS tablet Take 5 mg by mouth daily. 03/23/22  Yes [provider]  losartan (COZAAR)  50 MG tablet Take 1 tablet (50 mg total) by mouth daily. 04/29/15  Yes Jerline Pain, MD  metoprolol tartrate (LOPRESSOR) 25 MG tablet TAKE 1 TABLET BY MOUTH TWICE A DAY Patient taking differently: Take 25 mg by mouth 2 (two) times daily. 04/29/15  Yes Jerline Pain, MD  MYRBETRIQ 25 MG TB24 tablet Take 25 mg by mouth at bedtime.  05/28/19  Yes [provider]  rosuvastatin (CRESTOR) 40 MG tablet Take 40 mg by mouth at bedtime. 05/25/21  Yes [provider]  Tetrahydrozoline HCl (VISINE OP) Place 1 drop into both eyes as needed (for redness/itching). Visine S   Yes [provider]  Vitamins-Lipotropics (LIPO-FLAVONOID PLUS) TABS Take 1 tablet by mouth 3 (three) times daily after meals.   Yes [provider]  Zinc Sulfate (ZINC-220 PO) Take 220 mg by mouth daily.   Yes [provider]    Physical Exam: Vitals:   04/25/22 2100 04/25/22 2110 04/25/22 2205 04/25/22 2305  BP: 126/71  (!) 140/79 (!) 142/73  Pulse: 71 72 72 77  Resp: '20 18 18 18  '$ Temp:   98.3 F (36.8 C) 98 F (36.7 C)  TempSrc:   Oral Oral  SpO2: 90% 94% 97% 96%  Weight:    104.3 kg  Height:    '5\' 11"'$  (1.803 m)    Physical Exam Vitals reviewed.  Constitutional:      General: He is not in acute distress. HENT:     Head: Normocephalic and atraumatic.  Eyes:     Extraocular Movements: Extraocular movements  intact.  Cardiovascular:     Rate and Rhythm: Normal rate and regular rhythm.     Pulses: Normal pulses.  Pulmonary:     Effort: Pulmonary effort is normal. No respiratory distress.     Breath sounds: Normal breath sounds. No wheezing or rales.  Abdominal:     General: Bowel sounds are normal. There is no distension.     Palpations: Abdomen is soft.     Tenderness: There is no abdominal tenderness.  Musculoskeletal:        General: No tenderness.     Cervical back: Normal range of motion.     Right lower leg: Edema present.  Skin:    General: Skin is warm and dry.  Neurological:     General: No focal deficit present.     Mental Status: He is alert and oriented to person, place, and time.      Labs on Admission: I have personally reviewed following labs and imaging studies  CBC: Recent Labs  Lab 04/25/22 2014  WBC 8.2  NEUTROABS 5.6  HGB 14.6  HCT 45.4  MCV 99.3  PLT 283   Basic Metabolic Panel: Recent Labs  Lab 04/25/22 2014  NA 139  K 4.4  CL 100  CO2 29  GLUCOSE 101*  BUN 17  CREATININE 1.54*  CALCIUM 9.4   GFR: Estimated Creatinine Clearance: 51.7 mL/min (A) (by C-G formula based on SCr of 1.54 mg/dL (H)). Liver Function Tests: Recent Labs  Lab 04/25/22 2014  AST 21  ALT 24  ALKPHOS 52  BILITOT 0.5  PROT 7.1  ALBUMIN 4.3   No results for input(s): "LIPASE", "AMYLASE" in the last 168 hours. No results for input(s): "AMMONIA" in the last 168 hours. Coagulation Profile: No results for input(s): "INR", "PROTIME" in the last 168 hours. Cardiac Enzymes: No results for input(s): "CKTOTAL", "CKMB", "CKMBINDEX", "TROPONINI" in the last 168 hours. BNP (last 3  results) No results for input(s): "PROBNP" in the last 8760 hours. HbA1C: No results for input(s): "HGBA1C" in the last 72 hours. CBG: No results for input(s): "GLUCAP" in the last 168 hours. Lipid Profile: No results for input(s): "CHOL", "HDL", "LDLCALC", "TRIG", "CHOLHDL", "LDLDIRECT" in the  last 72 hours. Thyroid Function Tests: No results for input(s): "TSH", "T4TOTAL", "FREET4", "T3FREE", "THYROIDAB" in the last 72 hours. Anemia Panel: No results for input(s): "VITAMINB12", "FOLATE", "FERRITIN", "TIBC", "IRON", "RETICCTPCT" in the last 72 hours. Urine analysis:    Component Value Date/Time   COLORURINE STRAW (A) 01/08/2018 1349   APPEARANCEUR CLEAR 01/08/2018 1349   LABSPEC 1.009 01/08/2018 1349   LABSPEC 1.010 10/09/2013 0811   PHURINE 5.0 01/08/2018 1349   GLUCOSEU NEGATIVE 01/08/2018 1349   GLUCOSEU Negative 10/09/2013 0811   HGBUR NEGATIVE 01/08/2018 1349   BILIRUBINUR NEGATIVE 01/08/2018 1349   BILIRUBINUR Negative 10/09/2013 0811   KETONESUR NEGATIVE 01/08/2018 1349   PROTEINUR NEGATIVE 01/08/2018 1349   UROBILINOGEN 0.2 10/09/2013 0811   NITRITE NEGATIVE 01/08/2018 1349   LEUKOCYTESUR NEGATIVE 01/08/2018 1349   LEUKOCYTESUR Negative 10/09/2013 0811    Radiological Exams on Admission: I have personally reviewed images CT Abdomen Pelvis W Contrast  Addendum Date: 04/25/2022   ADDENDUM REPORT: 04/25/2022 20:07 ADDENDUM: Critical Value/emergent results were called by telephone on 04/25/2022 at 8:06 pm to provider Dr. Lorenso Courier, who verbally acknowledged these results. Electronically Signed   By: Yetta Glassman M.D.   On: 04/25/2022 20:07   Result Date: 04/25/2022 CLINICAL DATA:  History of DVT; monitor anal cancer; * Tracking Code: BO * EXAM: CT ANGIOGRAPHY CHEST CT ABDOMEN AND PELVIS WITH CONTRAST TECHNIQUE: Multidetector CT imaging of the chest was performed using the standard protocol during bolus administration of intravenous contrast. Multiplanar CT image reconstructions and MIPs were obtained to evaluate the vascular anatomy. Multidetector CT imaging of the abdomen and pelvis was performed using the standard protocol during bolus administration of intravenous contrast. RADIATION DOSE REDUCTION: This exam was performed according to the departmental  dose-optimization program which includes automated exposure control, adjustment of the mA and/or kV according to patient size and/or use of iterative reconstruction technique. CONTRAST:  74m OMNIPAQUE IOHEXOL 350 MG/ML SOLN COMPARISON:  None Available. FINDINGS: CTA CHEST FINDINGS Cardiovascular: Saddle pulmonary embolus of the main pulmonary artery and left and right pulmonary arteries, and bilateral lobar arteries. Elevated RV to LV ratio, 1.4. Normal heart size. No pericardial effusion. Normal caliber thoracic aorta with moderate atherosclerotic disease. Prior aortic valve replacement. Mitral annular calcifications. Moderate left main and three-vessel coronary artery calcifications. Mediastinum/Nodes: Esophagus and thyroid are unremarkable. Calcified mediastinal lymph nodes, likely sequela prior granulomatous infection. No pathologically enlarged lymph nodes seen in the chest. Lungs/Pleura: Central airways are patent. Bibasilar atelectasis. Branching, tubular high attenuation structure of the left lower lobe measuring 13 x 7 mm on series 5, image 199, finding was present on prior 2014 exam but has increased in size. Musculoskeletal: Prior median sternotomy with intact sternal wires. No acute or significant osseous findings. Review of the MIP images confirms the above findings. CT ABDOMEN and PELVIS FINDINGS Hepatobiliary: No focal liver abnormality is seen. Cholelithiasis with no gallbladder wall thickening. Pancreas: Unremarkable. No pancreatic ductal dilatation or surrounding inflammatory changes. Spleen: Normal in size without focal abnormality. Adrenals/Urinary Tract: Adrenal glands are unremarkable. No hydronephrosis. Punctate bilateral nonobstructing renal stones. Bladder is unremarkable. Stomach/Bowel: Stomach is within normal limits. Appendix appears normal. Mild diverticulosis. No evidence of bowel wall thickening, distention, or inflammatory changes. Vascular/Lymphatic: Nonocclusive filling  defect of  the right common femoral vein. Aortic atherosclerosis. No enlarged abdominal or pelvic lymph nodes. Reproductive: Prostate is unremarkable. Other: Small right-greater-than-left fat containing inguinal hernias. No abdominopelvic ascites. Musculoskeletal: No acute or significant osseous findings. Review of the MIP images confirms the above findings. IMPRESSION: CHEST CTA: 1. Acute saddle pulmonary embolus of the main pulmonary artery, left and right pulmonary arteries, and bilateral lobar arteries. 2. Calcifications are seen in the bilateral distal right and left pulmonary arteries, likely post thrombotic change related to prior pulmonary embolus. 3. Elevated RV to LV ratio, findings can be seen in the setting of right heart strain. Correlate with echocardiography. 4. Branching, tubular high attenuation structure of the left lower lobe, compatible with a bronchocele, finding was present on 2014 exam, but has increased in size. ABDOMEN AND PELVIS CT: 1. Nonocclusive filling defect of the right common femoral vein, compatible with history of DVT. 2. No evidence of metastatic disease in the abdomen or pelvis. 3. Aortic Atherosclerosis (ICD10-I70.0). Patient instructed by tech to go to the ER and on-call provider has been paged at the time of this dictation 04/25/2022 at 19:37. Addendum will be placed when communication is documented. Electronically Signed: By: Yetta Glassman M.D. On: 04/25/2022 20:00   CT Angio Chest Pulmonary Embolism (PE) W or WO Contrast  Addendum Date: 04/25/2022   ADDENDUM REPORT: 04/25/2022 20:07 ADDENDUM: Critical Value/emergent results were called by telephone on 04/25/2022 at 8:06 pm to provider Dr. Lorenso Courier, who verbally acknowledged these results. Electronically Signed   By: Yetta Glassman M.D.   On: 04/25/2022 20:07   Result Date: 04/25/2022 CLINICAL DATA:  History of DVT; monitor anal cancer; * Tracking Code: BO * EXAM: CT ANGIOGRAPHY CHEST CT ABDOMEN AND PELVIS WITH CONTRAST  TECHNIQUE: Multidetector CT imaging of the chest was performed using the standard protocol during bolus administration of intravenous contrast. Multiplanar CT image reconstructions and MIPs were obtained to evaluate the vascular anatomy. Multidetector CT imaging of the abdomen and pelvis was performed using the standard protocol during bolus administration of intravenous contrast. RADIATION DOSE REDUCTION: This exam was performed according to the departmental dose-optimization program which includes automated exposure control, adjustment of the mA and/or kV according to patient size and/or use of iterative reconstruction technique. CONTRAST:  64m OMNIPAQUE IOHEXOL 350 MG/ML SOLN COMPARISON:  None Available. FINDINGS: CTA CHEST FINDINGS Cardiovascular: Saddle pulmonary embolus of the main pulmonary artery and left and right pulmonary arteries, and bilateral lobar arteries. Elevated RV to LV ratio, 1.4. Normal heart size. No pericardial effusion. Normal caliber thoracic aorta with moderate atherosclerotic disease. Prior aortic valve replacement. Mitral annular calcifications. Moderate left main and three-vessel coronary artery calcifications. Mediastinum/Nodes: Esophagus and thyroid are unremarkable. Calcified mediastinal lymph nodes, likely sequela prior granulomatous infection. No pathologically enlarged lymph nodes seen in the chest. Lungs/Pleura: Central airways are patent. Bibasilar atelectasis. Branching, tubular high attenuation structure of the left lower lobe measuring 13 x 7 mm on series 5, image 199, finding was present on prior 2014 exam but has increased in size. Musculoskeletal: Prior median sternotomy with intact sternal wires. No acute or significant osseous findings. Review of the MIP images confirms the above findings. CT ABDOMEN and PELVIS FINDINGS Hepatobiliary: No focal liver abnormality is seen. Cholelithiasis with no gallbladder wall thickening. Pancreas: Unremarkable. No pancreatic ductal  dilatation or surrounding inflammatory changes. Spleen: Normal in size without focal abnormality. Adrenals/Urinary Tract: Adrenal glands are unremarkable. No hydronephrosis. Punctate bilateral nonobstructing renal stones. Bladder is unremarkable. Stomach/Bowel: Stomach is within normal  limits. Appendix appears normal. Mild diverticulosis. No evidence of bowel wall thickening, distention, or inflammatory changes. Vascular/Lymphatic: Nonocclusive filling defect of the right common femoral vein. Aortic atherosclerosis. No enlarged abdominal or pelvic lymph nodes. Reproductive: Prostate is unremarkable. Other: Small right-greater-than-left fat containing inguinal hernias. No abdominopelvic ascites. Musculoskeletal: No acute or significant osseous findings. Review of the MIP images confirms the above findings. IMPRESSION: CHEST CTA: 1. Acute saddle pulmonary embolus of the main pulmonary artery, left and right pulmonary arteries, and bilateral lobar arteries. 2. Calcifications are seen in the bilateral distal right and left pulmonary arteries, likely post thrombotic change related to prior pulmonary embolus. 3. Elevated RV to LV ratio, findings can be seen in the setting of right heart strain. Correlate with echocardiography. 4. Branching, tubular high attenuation structure of the left lower lobe, compatible with a bronchocele, finding was present on 2014 exam, but has increased in size. ABDOMEN AND PELVIS CT: 1. Nonocclusive filling defect of the right common femoral vein, compatible with history of DVT. 2. No evidence of metastatic disease in the abdomen or pelvis. 3. Aortic Atherosclerosis (ICD10-I70.0). Patient instructed by tech to go to the ER and on-call provider has been paged at the time of this dictation 04/25/2022 at 19:37. Addendum will be placed when communication is documented. Electronically Signed: By: Yetta Glassman M.D. On: 04/25/2022 20:00    EKG: Independently reviewed.  Sinus rhythm, no acute  ischemic changes.  Assessment and Plan  Acute right lower extremity DVT/saddle PE CTA chest showing acute saddle PE of the main pulmonary artery, left and right pulmonary arteries, and bilateral lobar arteries with evidence of right heart strain (elevated RV to LV ratio).  CT abdomen pelvis showing nonocclusive filling defect of the right common femoral vein compatible with history of DVT. ?Eliquis failure as he has been taking it since the beginning of this month and compliant.  Oncology recommended starting IV heparin.  No hypoxia, remains hemodynamically stable.  Troponin negative x3, BNP normal.  Precipitating factors include obesity and sedentary lifestyle.  He denies personal or family history of blood clots.  No recent surgeries.  History of metastatic anal cancer treated 10 years ago and restaging CT without evidence of metastatic disease in the abdomen or pelvis. -Continue IV heparin -Echocardiogram -Monitor hemodynamics very closely  AKI Creatinine 1.5, baseline around 1.0.  Patient started on IV fluids and creatinine improved to 1.3 on repeat labs. -Continue IV fluid hydration and monitor renal function -Avoid nephrotoxic agents/hold home losartan  History of metastatic anal cancer Treated 10 years ago and restaging CT without evidence of metastatic disease in the abdomen or pelvis. -Oncology follow-up  CAD Stable. -Continue aspirin, statin, beta-blocker  Non-insulin-dependent type 2 diabetes Well-controlled - A1c 6.4. -Sensitive sliding scale insulin ACHS  Hypertension Stable. -Continue metoprolol -Hold losartan given AKI  DVT prophylaxis: IV heparin gtt Code Status: Full Code (discussed with the patient) Family Communication: Wife at bedside. Level of care: Step Down Unit Admission status: It is my clinical opinion that referral for OBSERVATION is reasonable and necessary in this patient based on the above information provided. The aforementioned taken together are  felt to place the patient at high risk for further clinical deterioration. However, it is anticipated that the patient may be medically stable for discharge from the hospital within 24 to 48 hours.   Shela Leff MD [facility]  If 7PM-7AM, please contact night-coverage [email or web address]  04/26/2022, 1:17 AM

## 2022-04-26 NOTE — Plan of Care (Signed)
Patient is alert/oriented, denies pain or shortness of breath.   Problem: Education: Goal: Knowledge of General Education information will improve Description: Including pain rating scale, medication(s)/side effects and non-pharmacologic comfort measures Outcome: Progressing   Problem: Health Behavior/Discharge Planning: Goal: Ability to manage health-related needs will improve Outcome: Progressing   Problem: Clinical Measurements: Goal: Ability to maintain clinical measurements within normal limits will improve Outcome: Progressing Goal: Will remain free from infection Outcome: Progressing Goal: Diagnostic test results will improve Outcome: Progressing Goal: Respiratory complications will improve Outcome: Progressing Goal: Cardiovascular complication will be avoided Outcome: Progressing   Problem: Activity: Goal: Risk for activity intolerance will decrease Outcome: Progressing   Problem: Nutrition: Goal: Adequate nutrition will be maintained Outcome: Progressing   Problem: Coping: Goal: Level of anxiety will decrease Outcome: Progressing   Problem: Elimination: Goal: Will not experience complications related to bowel motility Outcome: Progressing Goal: Will not experience complications related to urinary retention Outcome: Progressing   Problem: Pain Managment: Goal: General experience of comfort will improve Outcome: Progressing   Problem: Safety: Goal: Ability to remain free from injury will improve Outcome: Progressing   Problem: Skin Integrity: Goal: Risk for impaired skin integrity will decrease Outcome: Progressing   Problem: Education: Goal: Ability to describe self-care measures that may prevent or decrease complications (Diabetes Survival Skills Education) will improve Outcome: Progressing Goal: Individualized Educational Video(s) Outcome: Progressing   Problem: Coping: Goal: Ability to adjust to condition or change in health will  improve Outcome: Progressing   Problem: Fluid Volume: Goal: Ability to maintain a balanced intake and output will improve Outcome: Progressing   Problem: Health Behavior/Discharge Planning: Goal: Ability to identify and utilize available resources and services will improve Outcome: Progressing Goal: Ability to manage health-related needs will improve Outcome: Progressing   Problem: Metabolic: Goal: Ability to maintain appropriate glucose levels will improve Outcome: Progressing   Problem: Nutritional: Goal: Maintenance of adequate nutrition will improve Outcome: Progressing Goal: Progress toward achieving an optimal weight will improve Outcome: Progressing   Problem: Skin Integrity: Goal: Risk for impaired skin integrity will decrease Outcome: Progressing   Problem: Tissue Perfusion: Goal: Adequacy of tissue perfusion will improve Outcome: Progressing

## 2022-04-26 NOTE — Progress Notes (Signed)
ANTICOAGULATION CONSULT NOTE - Consult  Pharmacy Consult for heparin Indication: pulmonary embolus  Allergies  Allergen Reactions   Cat Hair Extract Shortness Of Breath and Swelling   Atorvastatin Other (See Comments)    Pt has muscle ache on Atorvastatin but IS ABLE TO TAKE LIPITOR without any issues   Dust Mite Extract Other (See Comments) and Swelling   Metformin Other (See Comments)    Diarrhea, muscle cramps   Oxycodone Other (See Comments)    Seizure PATIENT DOES NOT WANT ANY NARCOTICS   Semaglutide(0.25 Or 0.'5mg'$ -Dos) Nausea Only    Patient Measurements: Height: '5\' 11"'$  (180.3 cm) Weight: 108 kg (238 lb 1.6 oz) IBW/kg (Calculated) : 75.3 Heparin Dosing Weight: 97kg  Vital Signs: Temp: 98 F (36.7 C) (08/23 2000) Temp Source: Oral (08/23 2000) BP: 119/57 (08/23 1800) Pulse Rate: 66 (08/23 1800)  Labs: Recent Labs    04/25/22 2014 04/25/22 2203 04/26/22 0233 04/26/22 0750 04/26/22 2014  HGB 14.6  --  13.5  --   --   HCT 45.4  --  41.9  --   --   PLT 180  --  161  --   --   APTT  --   --   --  125* 83*  HEPARINUNFRC  --   --   --  >1.10*  --   CREATININE 1.54*  --  1.31*  --   --   TROPONINIHS '17 16 16 13  '$ --      Estimated Creatinine Clearance: 61.9 mL/min (A) (by C-G formula based on SCr of 1.31 mg/dL (H)).   Assessment: 76 year old male with saddle PE with right heart strain on CTA. Of note patient undergoing treatment for DVT on apixaban prior to admit with recent fill earlier this month and patient reports compliance. Primary service is planning hematology consult and orders received to transition over to heparin for now.   Will need to follow aptt and heparin levels until the labs correlate.    Today, 04/26/22 aPTT 83 seconds, therapeutic with heparin at 1450 units/hr  No bleeding or infusion-related issues documented  Goal of Therapy:  Heparin level 0.3-0.7 units/ml aPTT 66-102 seconds Monitor platelets by anticoagulation protocol: Yes    Plan:  Continue heparin at 1450 units/hr  Obtain aPTT in 8 hours to verify therapeutic  Daily CBC, aPTT, HL  Monitor for signs and symptoms of bleeding  Peggyann Juba, PharmD, BCPS Pharmacy: [phone] 04/26/2022 8:42 PM

## 2022-04-26 NOTE — Consult Note (Addendum)
NAME:  Kirk [name], MRN:  [id number], DOB:  02/23/1946, LOS: 0 ADMISSION DATE:  04/25/2022, CONSULTATION DATE:  8/23 REFERRING MD:  Dr. Algis Liming, CHIEF COMPLAINT: SOB, incidental PE finding   History of Present Illness: -From the wife, patient and review of the medical records.  76 y/o M who presented to [facility] on 8/22 for admission in setting of shortness of breath.   Patient and wife recall that sometime after March 07, 2022 he had an episode of shortness of breath and congestion that was treated with antibiotics and prednisone.  In retrospect they are wondering if this could have been an episode of pulmonary embolism.  After that his symptoms did improve but he did have some amount of residual dyspnea on exertion.  [place] of July 2023 [facility] when he noticed right side lower extremity swelling the day he arrived.  He was evaluated in the emergency department and diagnosed with a right lower extremity DVT.  He was treated with apixaban which he reported compliance, short of missing 1 day.  He reported that lower extremity swelling improved.  He told the nurse practitioner -  noted that he had felt poorly for at least 3 weeks prior to going to the beach with fever, cough, and malaise but he told his CCM MD that he was feeling stable.  He did have residual shortness of breath with exertion that was worse than 3 July 4 baseline but similar to post July 4 baseline.  He is a Cur[employer] but because of the [place]ma that he he has been more sedentary than usual in the last few months.    He has had continued cough, productive yellow sputum at times.  He is followed by Dr. Benay Spice for remote metastatic anal cancer.  He was seen in the clinic on 8/14 by Dr. Benay Spice and was recommended for restaging CT.  The patient underwent restaging imaging on 8/22 which revealed a nonocclusive filling defect of the right common femoral vein compatible with history of DVT, no evidence of metastatic disease in  the abdomen or pelvis, acute saddle pulmonary embolus of the main pulmonary artery, left and right pulmonary arteries and bilateral lobar arteries, elevated RV to LV ratio of 1.4.  Patient was recommended evaluation in the emergency department and admission after imaging findings.  He was admitted per [facility] for further evaluation.  BNP 69, troponin 16.  Patient remains on room air and is hemodynamically stable.  Echo pending.  Pulm embolism risk factors There is no family history of pulmonary embolism Prior history of anal cancer currently in remission No known genetic causes for pulm embolism Recent increase sedentary lifestyle for the last few months Overweight plus  PCCM consulted for evaluation of pulmonary embolism.  Pertinent  Medical History  Metastatic Anal Cancer - in 2013, restaging CT w/o evidence of metastatic disease in abd/pelvis RLE DVT - diagnosed in 03/2022 COVID  CAD s/p CABG  AFib - post op in setting of CABG  HTN HLD DM II  Seizures    has a past medical history of Allergy, Angina, Aortic stenosis, Arthritis, Atrial fibrillation ([place]) (08/20/2011), Blood in stool, Cancer ([place]) (04/09/12), CHF (congestive heart failure) ([place]), Coronary artery disease, Diabetes mellitus, ED (erectile dysfunction), ED (erectile dysfunction), Elevated cholesterol, Heart murmur, History of kidney stones, History of radiation therapy (05/20/12-06/28/12), Hypertension, Kidney stones, Malignant neoplasm of anal canal ([place]) (05/12/2012), 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   reports that he has never smoked. He has never used smokeless tobacco.  Past  Surgical History:  Procedure Laterality Date   ANAL FISSURE REPAIR     patient unsure of date   ANTERIOR CERVICAL DECOMP/DISCECTOMY FUSION N/A 01/22/2018   Procedure: ANTERIOR CERVICAL DECOMPRESSION/DISCECTOMY FUSION CERVICAL SIX- CERVICAL SEVEN;  Surgeon: Erline Levine, MD;  Location: [place];  Service: Neurosurgery;  Laterality: N/A;  ANTERIOR CERVICAL DECOMPRESSION/DISCECTOMY FUSION CERVICAL SIX- CERVICAL SEVEN   AORTIC VALVE REPLACEMENT  08/11/2011   Procedure: AORTIC VALVE REPLACEMENT (AVR);  Surgeon: Rexene Alberts, MD;  Location: [place];  Service: Open Heart Surgery;  Laterality: N/A;   C4-6 FUSION  1995   DR STERN   COLONOSCOPY     hx polyps   CORONARY ARTERY BYPASS GRAFT  08/16/2011   Procedure: CORONARY ARTERY BYPASS GRAFTING (CABG);  Surgeon: Rexene Alberts, MD;  Location: [place];  Service: Open Heart Surgery;  Laterality: N/A;  CABG times one using left internal mammary artery   LITHOTRIPSY  2001 AND 2002   DR PETERSON   POLYPECTOMY     colon    Allergies  Allergen Reactions   Cat Hair Extract Shortness Of Breath and Swelling   Atorvastatin Other (See Comments)    Pt has muscle ache on Atorvastatin but IS ABLE TO TAKE LIPITOR without any issues   Dust Mite Extract Other (See Comments) and Swelling   Metformin Other (See Comments)    Diarrhea, muscle cramps   Oxycodone Other (See Comments)    Seizure PATIENT DOES NOT WANT ANY NARCOTICS   Semaglutide(0.25 Or 0.'5mg'$ -Dos) Nausea Only    Immunization History  Administered Date(s) Administered   Influenza Split 06/15/2014, 06/04/2018, 06/05/2019   Influenza, High Dose Seasonal PF 07/06/2017   Influenza-Unspecified 06/04/2020   PFIZER(Purple Top)SARS-COV-2 Vaccination 09/26/2019, 10/17/2019, 07/01/2020   Tdap 04/10/2018, 05/28/2019   Zoster Recombinat (Shingrix) 04/10/2018, 10/18/2018   Zoster, Live 04/17/2013    Family History  Problem Relation Age of Onset   Cancer Mother        breast   Heart attack Father     Esophageal cancer Maternal Grandfather    Anesthesia problems Neg Hx    Hypotension Neg Hx    Malignant hyperthermia Neg Hx    Pseudochol deficiency Neg Hx    Colon cancer Neg Hx    Colon polyps Neg Hx    Stomach cancer Neg Hx    Rectal cancer Neg Hx      Current Facility-Administered Medications:    0.9 %  sodium chloride infusion, , Intravenous, Continuous, Hongalgi, Anand D, MD, Last Rate: 50 mL/hr at 04/26/22 1311, New Bag at 04/26/22 1311   acetaminophen (TYLENOL) tablet 650 mg, 650 mg, Oral, Q6H PRN **OR** acetaminophen (TYLENOL) suppository 650 mg, 650 mg, Rectal, Q6H PRN, Kristopher Oppenheim, DO   albuterol (PROVENTIL) (2.5 MG/3ML) 0.083% nebulizer solution 2.5 mg, 2.5 mg, Nebulization, Q2H PRN, Kristopher Oppenheim, DO   aspirin EC tablet 81 mg, 81 mg, Oral, Daily, Shela Leff, MD, 81 mg at 04/26/22 3267   Chlorhexidine Gluconate Cloth 2 % PADS 6 each, 6 each, Topical, Daily, Kristopher Oppenheim, DO, 6 each at 04/26/22 0000   DULoxetine (CYMBALTA) DR capsule 60 mg, 60 mg, Oral, QHS, Shela Leff, MD   heparin  ADULT infusion 100 units/mL (25000 units/2105m), 1,450 Units/hr, Intravenous, Continuous, Hongalgi, Anand D, MD, Last Rate: 14.5 mL/hr at 04/26/22 1047, 1,450 Units/hr at 04/26/22 1047   insulin aspart (novoLOG) injection 0-5 Units, 0-5 Units, Subcutaneous, QHS, Rathore, VWandra Feinstein MD   insulin aspart (novoLOG) injection 0-9 Units, 0-9 Units, Subcutaneous, TID WC, RShela Leff MD, 1 Units at 04/26/22 1211   metoprolol tartrate (LOPRESSOR) tablet 25 mg, 25 mg, Oral, BID, RShela Leff MD, 25 mg at 04/26/22 02458  mirabegron ER (MYRBETRIQ) tablet 25 mg, 25 mg, Oral, QHS, RShela Leff MD   ondansetron (ZOFRAN) tablet 4 mg, 4 mg, Oral, Q6H PRN **OR** ondansetron (ZOFRAN) injection 4 mg, 4 mg, Intravenous, Q6H PRN, CKristopher Oppenheim DO   Oral care mouth rinse, 15 mL, Mouth Rinse, PRN, CKristopher Oppenheim DO   rosuvastatin (CRESTOR) tablet 40 mg, 40 mg, Oral, QHS, RShela Leff  MD   Significant Hospital Events: Including procedures, antibiotic start and stop dates in addition to other pertinent events   8/22 Admit with SOB, incidental PE finding on restaging CT  Interim History / Subjective:   04[facility]26/22-[facility].  Currently on room air at rest.  Normal vital signs.  Objective   Blood pressure (!) 144/54, pulse 83, temperature (!) 97.4 F (36.3 C), temperature source Oral, resp. rate (!) 23, height '5\' 11"'$  (1.803 m), weight 108 kg, SpO2 95 %.        Intake/Output Summary (Last 24 hours) at 04/26/2022 1201 Last data filed at 04/26/2022 0736 Gross per 24 hour  Intake 722.95 ml  Output 1150 ml  Net -427.05 ml   Filed Weights   04/25/22 1939 04/25/22 2305 04/26/22 0500  Weight: 103.4 kg 104.3 kg 108 kg    Examination: General: Obese male HENT: Mallampati class III no neck nodes Lungs: No distress.  Respiratory rate 15.  Pulse ox 97% room air.  No wheezing Cardiovascular: Normal heart sounds.  Regular rate and rhythm.  No tachycardia Abdomen: Soft nontender no organomegaly Extremities: No sinus no clubbing no edema Neuro: Alert and oriented x3.  Speech normal.  GCS 15 no focal deficits GU: Has condom catheter but otherwise not examined.  Resolved Hospital Problem list     Assessment & Plan:   Saddle Pulmonary Embolism -with RV-LV strain ratio 1.4 but normal PE biomarkers  -Per European criteria classifies as intermediate risk but low-intermediate risk given normal PE biomarkers but increased RV-LV strain   -Agree with the line of thinking that pulmonary embolism is not acute but has been festering at least from end of July 2023.  Unclear what the episode in early July 2023 was.   Plan -heparin per pharmacy  -continue for at least 72 hours since admission -> then aims to change back to oral anticoagulation  -Use rescue therapy of either systemic lysis versus catheter directed lysis versus catheter thrombectomy -> if  there is deterioration of the clinical situations and relative/absolute contraindications at that time for each of the above-mentioned interventions.  -Tract the biomarkers of troponin, lactic acid and BNP  -Await echocardiogram  - Get Doppler LE  -Okay to be on telemetry unit  -Avoid Valsalva maneuvers such as straining while going to the toilet or rapid shifts in posture  D/w DR HAlgis Liming Best Practice (right click and "Reselect all SmartList Selections" daily)  Per [facility]      SIGNATURE    Dr. MBrand Males M.D., F.C.C.P,  Pulmonary and Critical Care Medicine Staff Physician, [facility]  Center Director - Interstitial Lung Disease  Program  Medical Director - [facility] [place] at [place], [place], [address]  NPI Number:  NPI #[id number] DEA Number: [id number]  Pager: [phone], If no answer  -> Check AMION or Try [phone] Telephone (clinical office): [phone] Telephone (research): [phone]  1:35 PM 04/26/2022   LABS    PULMONARY No results for input(s): "PHART", "PCO2ART", "PO2ART", "HCO3", "TCO2", "O2SAT" in the last 168 hours.  Invalid input(s): "PCO2", "PO2"  CBC Recent Labs  Lab 04/25/22 2014 04/26/22 0233  HGB 14.6 13.5  HCT 45.4 41.9  WBC 8.2 8.2  PLT 180 161    COAGULATION No results for input(s): "INR" in the last 168 hours.  CARDIAC  No results for input(s): "TROPONINI" in the last 168 hours. No results for input(s): "PROBNP" in the last 168 hours.   CHEMISTRY Recent Labs  Lab 04/25/22 2014 04/26/22 0233  NA 139 139  K 4.4 3.8  CL 100 106  CO2 29 23  GLUCOSE 101* 116*  BUN 17 15  CREATININE 1.54* 1.31*  CALCIUM 9.4 8.9   Estimated Creatinine Clearance: 61.9 mL/min (A) (by C-G formula based on SCr of 1.31 mg/dL (H)).   LIVER Recent Labs  Lab 04/25/22 2014 04/26/22 0233  AST 21 23  ALT 24 24  ALKPHOS 52 46  BILITOT 0.5 0.7  PROT  7.1 6.4*  ALBUMIN 4.3 3.7     INFECTIOUS No results for input(s): "LATICACIDVEN", "PROCALCITON" in the last 168 hours.   ENDOCRINE CBG (last 3)  Recent Labs    04/26/22 0742 04/26/22 1138  GLUCAP 120* 149*         IMAGING x48h  - image(s) personally visualized  -   highlighted in bold CT Abdomen Pelvis W Contrast  Addendum Date: 04/25/2022   ADDENDUM REPORT: 04/25/2022 20:07 ADDENDUM: Critical Value/emergent results were called by telephone on 04/25/2022 at 8:06 pm to provider Dr. Lorenso Courier, who verbally acknowledged these results. Electronically Signed   By: Yetta Glassman M.D.   On: 04/25/2022 20:07   Result Date: 04/25/2022 CLINICAL DATA:  History of DVT; monitor anal cancer; * Tracking Code: BO * EXAM: CT ANGIOGRAPHY CHEST CT ABDOMEN AND PELVIS WITH CONTRAST TECHNIQUE: Multidetector CT imaging of the chest was performed using the standard protocol during bolus administration of intravenous contrast. Multiplanar CT image reconstructions and MIPs were obtained to evaluate the vascular anatomy. Multidetector CT imaging of the abdomen and pelvis was performed using the standard protocol during bolus administration of intravenous contrast. RADIATION DOSE REDUCTION: This exam was performed according to the departmental dose-optimization program which includes automated exposure control, adjustment of the mA and/or kV according to patient size and/or use of iterative reconstruction technique. CONTRAST:  1m OMNIPAQUE IOHEXOL 350 MG/ML SOLN COMPARISON:  None Available. FINDINGS: CTA CHEST FINDINGS Cardiovascular: Saddle pulmonary embolus of the main pulmonary artery and left and right pulmonary arteries, and bilateral lobar arteries. Elevated RV to LV ratio, 1.4. Normal heart size. No pericardial effusion. Normal caliber thoracic aorta with moderate atherosclerotic disease. Prior aortic valve replacement. Mitral annular calcifications. Moderate left main and three-vessel coronary artery  calcifications. Mediastinum/Nodes: Esophagus and thyroid are unremarkable. Calcified mediastinal lymph nodes, likely sequela prior granulomatous infection. No pathologically enlarged lymph nodes seen in the chest. Lungs/Pleura: Central airways are patent. Bibasilar atelectasis. Branching, tubular high attenuation structure of the left lower lobe measuring 13 x 7 mm on series 5,  image 199, finding was present on prior 2014 exam but has increased in size. Musculoskeletal: Prior median sternotomy with intact sternal wires. No acute or significant osseous findings. Review of the MIP images confirms the above findings. CT ABDOMEN and PELVIS FINDINGS Hepatobiliary: No focal liver abnormality is seen. Cholelithiasis with no gallbladder wall thickening. Pancreas: Unremarkable. No pancreatic ductal dilatation or surrounding inflammatory changes. Spleen: Normal in size without focal abnormality. Adrenals/Urinary Tract: Adrenal glands are unremarkable. No hydronephrosis. Punctate bilateral nonobstructing renal stones. Bladder is unremarkable. Stomach/Bowel: Stomach is within normal limits. Appendix appears normal. Mild diverticulosis. No evidence of bowel wall thickening, distention, or inflammatory changes. Vascular/Lymphatic: Nonocclusive filling defect of the right common femoral vein. Aortic atherosclerosis. No enlarged abdominal or pelvic lymph nodes. Reproductive: Prostate is unremarkable. Other: Small right-greater-than-left fat containing inguinal hernias. No abdominopelvic ascites. Musculoskeletal: No acute or significant osseous findings. Review of the MIP images confirms the above findings. IMPRESSION: CHEST CTA: 1. Acute saddle pulmonary embolus of the main pulmonary artery, left and right pulmonary arteries, and bilateral lobar arteries. 2. Calcifications are seen in the bilateral distal right and left pulmonary arteries, likely post thrombotic change related to prior pulmonary embolus. 3. Elevated RV to LV ratio,  findings can be seen in the setting of right heart strain. Correlate with echocardiography. 4. Branching, tubular high attenuation structure of the left lower lobe, compatible with a bronchocele, finding was present on 2014 exam, but has increased in size. ABDOMEN AND PELVIS CT: 1. Nonocclusive filling defect of the right common femoral vein, compatible with history of DVT. 2. No evidence of metastatic disease in the abdomen or pelvis. 3. Aortic Atherosclerosis (ICD10-I70.0). Patient instructed by tech to go to the ER and on-call provider has been paged at the time of this dictation 04/25/2022 at 19:37. Addendum will be placed when communication is documented. Electronically Signed: By: Yetta Glassman M.D. On: 04/25/2022 20:00   CT Angio Chest Pulmonary Embolism (PE) W or WO Contrast  Addendum Date: 04/25/2022   ADDENDUM REPORT: 04/25/2022 20:07 ADDENDUM: Critical Value/emergent results were called by telephone on 04/25/2022 at 8:06 pm to provider Dr. Lorenso Courier, who verbally acknowledged these results. Electronically Signed   By: Yetta Glassman M.D.   On: 04/25/2022 20:07   Result Date: 04/25/2022 CLINICAL DATA:  History of DVT; monitor anal cancer; * Tracking Code: BO * EXAM: CT ANGIOGRAPHY CHEST CT ABDOMEN AND PELVIS WITH CONTRAST TECHNIQUE: Multidetector CT imaging of the chest was performed using the standard protocol during bolus administration of intravenous contrast. Multiplanar CT image reconstructions and MIPs were obtained to evaluate the vascular anatomy. Multidetector CT imaging of the abdomen and pelvis was performed using the standard protocol during bolus administration of intravenous contrast. RADIATION DOSE REDUCTION: This exam was performed according to the departmental dose-optimization program which includes automated exposure control, adjustment of the mA and/or kV according to patient size and/or use of iterative reconstruction technique. CONTRAST:  62m OMNIPAQUE IOHEXOL 350 MG/ML SOLN  COMPARISON:  None Available. FINDINGS: CTA CHEST FINDINGS Cardiovascular: Saddle pulmonary embolus of the main pulmonary artery and left and right pulmonary arteries, and bilateral lobar arteries. Elevated RV to LV ratio, 1.4. Normal heart size. No pericardial effusion. Normal caliber thoracic aorta with moderate atherosclerotic disease. Prior aortic valve replacement. Mitral annular calcifications. Moderate left main and three-vessel coronary artery calcifications. Mediastinum/Nodes: Esophagus and thyroid are unremarkable. Calcified mediastinal lymph nodes, likely sequela prior granulomatous infection. No pathologically enlarged lymph nodes seen in the chest. Lungs/Pleura: Central airways are patent. Bibasilar atelectasis.  Branching, tubular high attenuation structure of the left lower lobe measuring 13 x 7 mm on series 5, image 199, finding was present on prior 2014 exam but has increased in size. Musculoskeletal: Prior median sternotomy with intact sternal wires. No acute or significant osseous findings. Review of the MIP images confirms the above findings. CT ABDOMEN and PELVIS FINDINGS Hepatobiliary: No focal liver abnormality is seen. Cholelithiasis with no gallbladder wall thickening. Pancreas: Unremarkable. No pancreatic ductal dilatation or surrounding inflammatory changes. Spleen: Normal in size without focal abnormality. Adrenals/Urinary Tract: Adrenal glands are unremarkable. No hydronephrosis. Punctate bilateral nonobstructing renal stones. Bladder is unremarkable. Stomach/Bowel: Stomach is within normal limits. Appendix appears normal. Mild diverticulosis. No evidence of bowel wall thickening, distention, or inflammatory changes. Vascular/Lymphatic: Nonocclusive filling defect of the right common femoral vein. Aortic atherosclerosis. No enlarged abdominal or pelvic lymph nodes. Reproductive: Prostate is unremarkable. Other: Small right-greater-than-left fat containing inguinal hernias. No  abdominopelvic ascites. Musculoskeletal: No acute or significant osseous findings. Review of the MIP images confirms the above findings. IMPRESSION: CHEST CTA: 1. Acute saddle pulmonary embolus of the main pulmonary artery, left and right pulmonary arteries, and bilateral lobar arteries. 2. Calcifications are seen in the bilateral distal right and left pulmonary arteries, likely post thrombotic change related to prior pulmonary embolus. 3. Elevated RV to LV ratio, findings can be seen in the setting of right heart strain. Correlate with echocardiography. 4. Branching, tubular high attenuation structure of the left lower lobe, compatible with a bronchocele, finding was present on 2014 exam, but has increased in size. ABDOMEN AND PELVIS CT: 1. Nonocclusive filling defect of the right common femoral vein, compatible with history of DVT. 2. No evidence of metastatic disease in the abdomen or pelvis. 3. Aortic Atherosclerosis (ICD10-I70.0). Patient instructed by tech to go to the ER and on-call provider has been paged at the time of this dictation 04/25/2022 at 19:37. Addendum will be placed when communication is documented. Electronically Signed: By: Yetta Glassman M.D. On: 04/25/2022 20:00

## 2022-04-26 NOTE — Progress Notes (Signed)
Patient has remained stable in SDU with normal vitals and normal PE biomarkers  Plan   - move to telemetry  - continue bedrest till echo complete with oob privileges  D/w Dr Marella Bile    Dr. Brand Males, M.D., F.C.C.P,  Pulmonary and Critical Care Medicine Staff Physician, [place] Director - Interstitial Lung Disease  Program  Medical Director - [facility] [place] at [place], [place], [address]  NPI Number:  NPI #[id number] [facility] Number: [id number]  Pager: [phone], If no answer  -[place] or Try [phone] Telephone (clinical office): [phone] Telephone (research): [phone]  6:37 PM 04/26/2022

## 2022-04-27 ENCOUNTER — Inpatient Hospital Stay (HOSPITAL_COMMUNITY): Payer: Medicare Other

## 2022-04-27 DIAGNOSIS — I2699 Other pulmonary embolism without acute cor pulmonale: Secondary | ICD-10-CM

## 2022-04-27 DIAGNOSIS — N179 Acute kidney failure, unspecified: Secondary | ICD-10-CM | POA: Diagnosis not present

## 2022-04-27 DIAGNOSIS — I2692 Saddle embolus of pulmonary artery without acute cor pulmonale: Secondary | ICD-10-CM

## 2022-04-27 LAB — ECHOCARDIOGRAM COMPLETE
AV Mean grad: 16 mmHg
AV Peak grad: 33.9 mmHg
Ao pk vel: 2.91 m/s
Height: 71 in
S' Lateral: 2.4 cm
Weight: 3809.55 oz

## 2022-04-27 LAB — CBC
HCT: 44.1 % (ref 39.0–52.0)
Hemoglobin: 13.9 g/dL (ref 13.0–17.0)
MCH: 31.7 pg (ref 26.0–34.0)
MCHC: 31.5 g/dL (ref 30.0–36.0)
MCV: 100.5 fL — ABNORMAL HIGH (ref 80.0–100.0)
Platelets: 162 10*3/uL (ref 150–400)
RBC: 4.39 MIL/uL (ref 4.22–5.81)
RDW: 15.3 % (ref 11.5–15.5)
WBC: 6.4 10*3/uL (ref 4.0–10.5)
nRBC: 0 % (ref 0.0–0.2)

## 2022-04-27 LAB — GLUCOSE, CAPILLARY
Glucose-Capillary: 125 mg/dL — ABNORMAL HIGH (ref 70–99)
Glucose-Capillary: 94 mg/dL (ref 70–99)
Glucose-Capillary: 116 mg/dL — ABNORMAL HIGH (ref 70–99)

## 2022-04-27 LAB — BASIC METABOLIC PANEL
Anion gap: 5 (ref 5–15)
BUN: 15 mg/dL (ref 8–23)
CO2: 27 mmol/L (ref 22–32)
Calcium: 9 mg/dL (ref 8.9–10.3)
Chloride: 107 mmol/L (ref 98–111)
Creatinine, Ser: 1.18 mg/dL (ref 0.61–1.24)
GFR, Estimated: >60 mL/min (ref >60)
Glucose, Bld: 123 mg/dL — ABNORMAL HIGH (ref 70–99)
Potassium: 4.3 mmol/L (ref 3.5–5.1)
Sodium: 139 mmol/L (ref 135–145)

## 2022-04-27 LAB — LACTIC ACID, PLASMA: Lactic Acid, Venous: 0.9 mmol/L (ref 0.5–1.9)

## 2022-04-27 LAB — HEPARIN LEVEL (UNFRACTIONATED): Heparin Unfractionated: 0.84 IU/mL — ABNORMAL HIGH (ref 0.30–0.70)

## 2022-04-27 LAB — BRAIN NATRIURETIC PEPTIDE: B Natriuretic Peptide: 130.9 pg/mL — ABNORMAL HIGH (ref 0.0–100.0)

## 2022-04-27 LAB — APTT: aPTT: 78 seconds — ABNORMAL HIGH (ref 24–36)

## 2022-04-27 LAB — TROPONIN I (HIGH SENSITIVITY): Troponin I (High Sensitivity): 11 ng/L (ref <18)

## 2022-04-27 MED ORDER — NAPHAZOLINE-PHENIRAMINE 0.025-0.3 % OP SOLN
1.0000 [drp] | Freq: Four times a day (QID) | OPHTHALMIC | Status: DC | PRN
  Administered 2022-04-28: 1 [drp] via OPHTHALMIC
  Filled 2022-04-27: qty 15

## 2022-04-27 MED ORDER — PERFLUTREN LIPID MICROSPHERE
1.0000 mL | INTRAVENOUS | Status: DC | PRN
  Administered 2022-04-27: 6 mL via INTRAVENOUS

## 2022-04-27 NOTE — Progress Notes (Signed)
PROGRESS NOTE   Kirk [name]  UUE:[id number]    DOB: 1946-06-25    DOA: 04/25/2022  PCP: Maury Dus, MD   I have briefly reviewed patients previous medical records in [facility].  Chief Complaint  Patient presents with   Shortness of Breath    Brief Narrative:  76 year old married male, PMH of remote history of metastatic anal cancer, diagnosed with a right lower extremity DVT in late July, possibly unprovoked,?  COVID-19 infection, was placed on Eliquis and reports compliance except missing dose for 1 day, aortic stenosis s/p valve replacement, CAD s/p CABG, postop A-fib, type II DM, HLD, HTN, seizures, seen by oncology on 8/14 and given DVT, cancer restaging CT study were ordered, underwent CTA chest and CT A/P on 8/22 which confirmed acute saddle PE and was advised by oncologist to present to the ED.  Admitted to stepdown.  Initiated on IV heparin drip.  Medical oncology and PCCM consulted.   Assessment & Plan:  Principal Problem:   Saddle pulmonary embolus (HCC) Active Problems:   Coronary artery disease involving native coronary artery of native heart without angina pectoris   History of anal cancer   Right leg DVT (HCC)   Essential hypertension   AKI (acute kidney injury) ([place])   Type 2 diabetes mellitus ([place])   Acute saddle pulmonary embolus/RLE DVT: RLE DVT diagnosed end of July 2023, started on Eliquis, claims compliance except for missing dose on 1 day, denies any new cardiorespiratory symptoms, medical oncologist obtained CTA chest and CT A/P for cancer staging which revealed the acute saddle PE of the main pulmonary artery, left and right pulmonary arteries, and bilateral lobar arteries, possible right heart strain and nonocclusive filling defect of the right common femoral vein compatible with history of DVT.  No metastatic disease in abdomen or pelvis.  Hemodynamically stable.  Not hypoxic.  As per discussion with Dr. Benay Spice, medical oncology, does not suspect  Eliquis failure and feels that the PE may have been present at the same time DVT was diagnosed.  PCCM consulted to see if he would be a candidate for any aggressive intervention i.e. thrombectomy or thrombolysis.  HS Troponin cycled and negative.  BNP unremarkable.  2D echo shows normal EF, grade 1 diastolic dysfunction, normal RV function.  As per CCM input, IV heparin x72 hours (supposed to complete early 8/26 AM), then can be transitioned back to Eliquis at discharge.  Lifelong anticoagulation per hematology.  Outpatient follow-up at the cancer center.  LEV Doppler confirms acute DVT involving right femoral, popliteal, peroneal and gastrocnemius veins.  Acute kidney injury: Baseline creatinine 1.0, presented with creatinine of 1.5.  Held losartan.  Avoiding nephrotoxic's.  Brief IV fluids.  Creatinine down to 1.18.  Can resume losartan at time of discharge.  Remote history of metastatic anal cancer Treated 10 years ago and restaging CT without evidence of metastatic disease in the abdomen or pelvis. -Dr.Sherrill, oncology follow-up appreciated.   CAD, s/p CABG Stable. -Continue aspirin, statin, beta-blocker.  However once switching back to DOAC if that is done, will need to reconsider if aspirin need to be continued or stopped.   Non-insulin-dependent type 2 diabetes Well-controlled - A1c 6.4. -Sensitive sliding scale insulin ACHS   Hypertension Stable. -Continue metoprolol -Hold losartan given AKI  Body mass index is 33.21 kg/m.    DVT prophylaxis:      Code Status: Full Code:  Family Communication: Spouse at bedside Disposition:  Status is: Inpatient.     Consultants:  Medical oncology PCCM  Procedures:     Antimicrobials:      Subjective:  Denies complaints.  No chest pain or dyspnea.  Objective:   Vitals:   04/27/22 0900 04/27/22 1000 04/27/22 1100 04/27/22 1200  BP:  127/83    Pulse: 68 62 (!) 57   Resp: '20 14 18   '$ Temp:    97.6 F (36.4 C)  TempSrc:     Oral  SpO2: (!) 89% 94% 93%   Weight:      Height:        General exam: Elderly male, moderately built and obese lying comfortably supine in bed without distress. Respiratory system: Clear to auscultation.  No increased work of breathing. Cardiovascular system: S1 & S2 heard, RRR. No JVD, murmurs, rubs, gallops or clicks. No pedal edema.  Telemetry personally reviewed: Sinus rhythm. Gastrointestinal system: Abdomen is nondistended, soft and nontender. No organomegaly or masses felt. Normal bowel sounds heard. Central nervous system: Alert and oriented. No focal neurological deficits. Extremities: Symmetric 5 x 5 power.  Right leg mildly asymmetrically swollen compared to left but otherwise no acute findings.  Compartments soft.  Unchanged and stable. Skin: No rashes, lesions or ulcers Psychiatry: Judgement and insight appear normal. Mood & affect appropriate.     Data Reviewed:   I have personally reviewed following labs and imaging studies   CBC: Recent Labs  Lab 04/25/22 2014 04/26/22 0233 04/27/22 0527  WBC 8.2 8.2 6.4  NEUTROABS 5.6 5.3  --   HGB 14.6 13.5 13.9  HCT 45.4 41.9 44.1  MCV 99.3 99.5 100.5*  PLT 180 161 213    Basic Metabolic Panel: Recent Labs  Lab 04/25/22 2014 04/26/22 0233 04/27/22 0527  NA 139 139 139  K 4.4 3.8 4.3  CL 100 106 107  CO2 '29 23 27  '$ GLUCOSE 101* 116* 123*  BUN '17 15 15  '$ CREATININE 1.54* 1.31* 1.18  CALCIUM 9.4 8.9 9.0    Liver Function Tests: Recent Labs  Lab 04/25/22 2014 04/26/22 0233  AST 21 23  ALT 24 24  ALKPHOS 52 46  BILITOT 0.5 0.7  PROT 7.1 6.4*  ALBUMIN 4.3 3.7    CBG: Recent Labs  Lab 04/26/22 2118 04/27/22 0743 04/27/22 1227  GLUCAP 114* 125* 94    Microbiology Studies:   Recent Results (from the past 240 hour(s))  MRSA Next Gen by PCR, Nasal     Status: None   Collection Time: 04/25/22 10:59 PM   Specimen: Nasal Mucosa; Nasal Swab  Result Value Ref Range Status   MRSA by PCR Next Gen NOT  DETECTED NOT DETECTED Final    Comment: (NOTE) The GeneXpert MRSA Assay (FDA approved for NASAL specimens only), is one component of a comprehensive MRSA colonization surveillance program. It is not intended to diagnose MRSA infection nor to guide or monitor treatment for MRSA infections. Test performance is not FDA approved in patients less than 75 years old. Performed at [facility], [place] [address], [place], [place] [address]     Radiology Studies:  VAS [place] LOWER EXTREMITY VENOUS (DVT)  Result Date: 04/27/2022  Lower Venous DVT Study Patient Name:  ISSAC MOURE  Date of Exam:   04/27/2022 Medical Rec #: [id number]       Accession #:    [id number] Date of Birth: [birth year]       Patient Gender: M Patient Age:   71 years Exam Location:  [facility] Procedure:  VAS [place] LOWER EXTREMITY VENOUS (DVT) Referring Phys: MURALI RAMASWAMY --------------------------------------------------------------------------------  Indications: Pulmonary embolism.  Risk Factors: Confirmed PE. Anticoagulation: Heparin. Limitations: Poor ultrasound/tissue interface. Comparison Study: No prior studies. Performing Technologist: Oliver Hum RVT  Examination Guidelines: A complete evaluation includes B-mode imaging, spectral Doppler, color Doppler, and power Doppler as needed of all accessible portions of each vessel. Bilateral testing is considered an integral part of a complete examination. Limited examinations for reoccurring indications may be performed as noted. The reflux portion of the exam is performed with the patient in reverse Trendelenburg.  +---------+---------------+---------+-----------+----------+-----------------+ RIGHT    CompressibilityPhasicitySpontaneityPropertiesThrombus Aging    +---------+---------------+---------+-----------+----------+-----------------+ CFV      Full           Yes      Yes                                     +---------+---------------+---------+-----------+----------+-----------------+ SFJ      Full                                                           +---------+---------------+---------+-----------+----------+-----------------+ FV Prox  None           No       No                   Acute             +---------+---------------+---------+-----------+----------+-----------------+ FV Mid   None           No       No                   Acute             +---------+---------------+---------+-----------+----------+-----------------+ FV DistalNone           No       No                   Acute             +---------+---------------+---------+-----------+----------+-----------------+ PFV      Full                                                           +---------+---------------+---------+-----------+----------+-----------------+ POP      None           No       No                   Acute             +---------+---------------+---------+-----------+----------+-----------------+ PTV      Full                                                           +---------+---------------+---------+-----------+----------+-----------------+ PERO     None  Acute             +---------+---------------+---------+-----------+----------+-----------------+ Gastroc  None                                         Age Indeterminate +---------+---------------+---------+-----------+----------+-----------------+   +---------+---------------+---------+-----------+----------+--------------+ LEFT     CompressibilityPhasicitySpontaneityPropertiesThrombus Aging +---------+---------------+---------+-----------+----------+--------------+ CFV      Full           Yes      Yes                                 +---------+---------------+---------+-----------+----------+--------------+ SFJ      Full                                                         +---------+---------------+---------+-----------+----------+--------------+ FV Prox  Full                                                        +---------+---------------+---------+-----------+----------+--------------+ FV Mid   Full                                                        +---------+---------------+---------+-----------+----------+--------------+ FV DistalFull                                                        +---------+---------------+---------+-----------+----------+--------------+ PFV      Full                                                        +---------+---------------+---------+-----------+----------+--------------+ POP      Full           Yes      Yes                                 +---------+---------------+---------+-----------+----------+--------------+ PTV      Full                                                        +---------+---------------+---------+-----------+----------+--------------+ PERO     Full                                                        +---------+---------------+---------+-----------+----------+--------------+  Summary: RIGHT: - Findings consistent with acute deep vein thrombosis involving the right femoral vein, right popliteal vein, right peroneal veins, and right gastrocnemius veins. - No cystic structure found in the popliteal fossa.  LEFT: - There is no evidence of deep vein thrombosis in the lower extremity.  - No cystic structure found in the popliteal fossa.  *See table(s) above for measurements and observations. Electronically signed by Monica Martinez MD on 04/27/2022 at 1:05:54 PM.    Final    ECHOCARDIOGRAM COMPLETE  Result Date: 04/27/2022    ECHOCARDIOGRAM REPORT   Patient Name:   STIRLING ORTON Date of Exam: 04/27/2022 Medical Rec #:  [id number]      Height:       71.0 in Accession #:    [id number]     Weight:       238.1 lb Date of Birth:  08/12/1946      BSA:          2.271  m Patient Age:    16 years       BP:           149/77 mmHg Patient Gender: M              HR:           67 bpm. Exam Location:  Inpatient Procedure: 2D Echo, Cardiac Doppler, Color Doppler and Intracardiac            Opacification Agent Indications:    Pulmonary Embolism  History:        Patient has prior history of Echocardiogram examinations, most                 recent 07/15/2020. CHF; Risk Factors:Hypertension and Diabetes.                 Aortic Valve: 25 mm Magna Ease Pericardial Valve valve is                 present in the aortic position.  Sonographer:    Jefferey Pica Referring Phys: [id number] Kyree Adriano D Estevan Kersh IMPRESSIONS  1. Left ventricular ejection fraction, by estimation, is 65 to 70%. The left ventricle has normal function. The left ventricle has no regional wall motion abnormalities. There is mild concentric left ventricular hypertrophy. Left ventricular diastolic parameters are consistent with Grade I diastolic dysfunction (impaired relaxation).  2. Right ventricular systolic function is normal. The right ventricular size is normal. Tricuspid regurgitation signal is inadequate for assessing PA pressure.  3. Left atrial size was moderately dilated.  4. The mitral valve is abnormal. No evidence of mitral valve regurgitation. Moderate mitral annular calcification.  5. The aortic valve has been repaired/replaced. Aortic valve regurgitation is not visualized. There is a 25 mm Magna Ease Pericardial Valve valve present in the aortic position. Aortic valve mean gradient measures 16.0 mmHg. Prosthetic leaflets are not well visualized, however, mean gradient appears stable to slightly improved from prior study in 07/2020 (mean gradient 15mHg at that time).  6. Aortic dilatation noted. There is borderline dilatation of the ascending aorta, measuring 38 mm.  7. The inferior vena cava is normal in size with greater than 50% respiratory variability, suggesting right atrial pressure of 3 mmHg. Comparison(s):  Compared to prior study on 07/2020, there is no significant change. Prior mean AoV gradient 219mg. FINDINGS  Left Ventricle: Left ventricular ejection fraction, by estimation, is 65 to 70%. The left ventricle has normal function. The left ventricle has no regional wall motion abnormalities.  The left ventricular internal cavity size was normal in size. There is  mild concentric left ventricular hypertrophy. Left ventricular diastolic parameters are consistent with Grade I diastolic dysfunction (impaired relaxation). Right Ventricle: The right ventricular size is normal. No increase in right ventricular wall thickness. Right ventricular systolic function is normal. Tricuspid regurgitation signal is inadequate for assessing PA pressure. Left Atrium: Left atrial size was moderately dilated. Right Atrium: Right atrial size was normal in size. Pericardium: There is no evidence of pericardial effusion. Mitral Valve: The mitral valve is abnormal. There is mild thickening of the mitral valve leaflet(s). There is mild calcification of the mitral valve leaflet(s). Moderate mitral annular calcification. No evidence of mitral valve regurgitation. Tricuspid Valve: The tricuspid valve is normal in structure. Tricuspid valve regurgitation is trivial. Aortic Valve: The aortic valve has been repaired/replaced. Aortic valve regurgitation is not visualized. Aortic valve mean gradient measures 16.0 mmHg. Aortic valve peak gradient measures 33.9 mmHg. There is a 25 mm Magna Ease Pericardial Valve valve present in the aortic position. Pulmonic Valve: The pulmonic valve was not well visualized. Pulmonic valve regurgitation is not visualized. Aorta: Aortic dilatation noted. There is borderline dilatation of the ascending aorta, measuring 38 mm. Venous: The inferior vena cava is normal in size with greater than 50% respiratory variability, suggesting right atrial pressure of 3 mmHg. IAS/Shunts: The atrial septum is grossly normal.  LEFT  VENTRICLE PLAX 2D LVIDd:         4.50 cm LVIDs:         2.40 cm LV PW:         1.30 cm LV IVS:        1.40 cm  IVC IVC diam: 2.20 cm LEFT ATRIUM             Index        RIGHT ATRIUM           Index LA diam:        4.40 cm 1.94 cm/m   RA Area:     19.20 cm LA Vol (A2C):   55.0 ml 24.22 ml/m  RA Volume:   55.40 ml  24.40 ml/m LA Vol (A4C):   94.0 ml 41.40 ml/m LA Biplane Vol: 79.6 ml 35.05 ml/m  AORTIC VALVE               PULMONIC VALVE AV Vmax:      291.00 cm/s  PV Vmax:       0.72 m/s AV Vmean:     172.667 cm/s PV Peak grad:  2.1 mmHg AV VTI:       0.587 m AV Peak Grad: 33.9 mmHg AV Mean Grad: 16.0 mmHg  AORTA Ao Root diam: 3.40 cm Ao Asc diam:  3.80 cm Gwyndolyn Kaufman MD Electronically signed by Gwyndolyn Kaufman MD Signature Date/Time: 04/27/2022/11:29:30 AM    Final    CT Abdomen Pelvis W Contrast  Addendum Date: 04/25/2022   ADDENDUM REPORT: 04/25/2022 20:07 ADDENDUM: Critical Value/emergent results were called by telephone on 04/25/2022 at 8:06 pm to provider Dr. Lorenso Courier, who verbally acknowledged these results. Electronically Signed   By: Yetta Glassman M.D.   On: 04/25/2022 20:07   Result Date: 04/25/2022 CLINICAL DATA:  History of DVT; monitor anal cancer; * Tracking Code: BO * EXAM: CT ANGIOGRAPHY CHEST CT ABDOMEN AND PELVIS WITH CONTRAST TECHNIQUE: Multidetector CT imaging of the chest was performed using the standard protocol during bolus administration of intravenous contrast. Multiplanar CT image reconstructions and MIPs were obtained  to evaluate the vascular anatomy. Multidetector CT imaging of the abdomen and pelvis was performed using the standard protocol during bolus administration of intravenous contrast. RADIATION DOSE REDUCTION: This exam was performed according to the departmental dose-optimization program which includes automated exposure control, adjustment of the mA and/or kV according to patient size and/or use of iterative reconstruction technique. CONTRAST:  78m OMNIPAQUE  IOHEXOL 350 MG/ML SOLN COMPARISON:  None Available. FINDINGS: CTA CHEST FINDINGS Cardiovascular: Saddle pulmonary embolus of the main pulmonary artery and left and right pulmonary arteries, and bilateral lobar arteries. Elevated RV to LV ratio, 1.4. Normal heart size. No pericardial effusion. Normal caliber thoracic aorta with moderate atherosclerotic disease. Prior aortic valve replacement. Mitral annular calcifications. Moderate left main and three-vessel coronary artery calcifications. Mediastinum/Nodes: Esophagus and thyroid are unremarkable. Calcified mediastinal lymph nodes, likely sequela prior granulomatous infection. No pathologically enlarged lymph nodes seen in the chest. Lungs/Pleura: Central airways are patent. Bibasilar atelectasis. Branching, tubular high attenuation structure of the left lower lobe measuring 13 x 7 mm on series 5, image 199, finding was present on prior 2014 exam but has increased in size. Musculoskeletal: Prior median sternotomy with intact sternal wires. No acute or significant osseous findings. Review of the MIP images confirms the above findings. CT ABDOMEN and PELVIS FINDINGS Hepatobiliary: No focal liver abnormality is seen. Cholelithiasis with no gallbladder wall thickening. Pancreas: Unremarkable. No pancreatic ductal dilatation or surrounding inflammatory changes. Spleen: Normal in size without focal abnormality. Adrenals/Urinary Tract: Adrenal glands are unremarkable. No hydronephrosis. Punctate bilateral nonobstructing renal stones. Bladder is unremarkable. Stomach/Bowel: Stomach is within normal limits. Appendix appears normal. Mild diverticulosis. No evidence of bowel wall thickening, distention, or inflammatory changes. Vascular/Lymphatic: Nonocclusive filling defect of the right common femoral vein. Aortic atherosclerosis. No enlarged abdominal or pelvic lymph nodes. Reproductive: Prostate is unremarkable. Other: Small right-greater-than-left fat containing inguinal  hernias. No abdominopelvic ascites. Musculoskeletal: No acute or significant osseous findings. Review of the MIP images confirms the above findings. IMPRESSION: CHEST CTA: 1. Acute saddle pulmonary embolus of the main pulmonary artery, left and right pulmonary arteries, and bilateral lobar arteries. 2. Calcifications are seen in the bilateral distal right and left pulmonary arteries, likely post thrombotic change related to prior pulmonary embolus. 3. Elevated RV to LV ratio, findings can be seen in the setting of right heart strain. Correlate with echocardiography. 4. Branching, tubular high attenuation structure of the left lower lobe, compatible with a bronchocele, finding was present on 2014 exam, but has increased in size. ABDOMEN AND PELVIS CT: 1. Nonocclusive filling defect of the right common femoral vein, compatible with history of DVT. 2. No evidence of metastatic disease in the abdomen or pelvis. 3. Aortic Atherosclerosis (ICD10-I70.0). Patient instructed by tech to go to the ER and on-call provider has been paged at the time of this dictation 04/25/2022 at 19:37. Addendum will be placed when communication is documented. Electronically Signed: By: LYetta GlassmanM.D. On: 04/25/2022 20:00   CT Angio Chest Pulmonary Embolism (PE) W or WO Contrast  Addendum Date: 04/25/2022   ADDENDUM REPORT: 04/25/2022 20:07 ADDENDUM: Critical Value/emergent results were called by telephone on 04/25/2022 at 8:06 pm to provider Dr. DLorenso Courier who verbally acknowledged these results. Electronically Signed   By: LYetta GlassmanM.D.   On: 04/25/2022 20:07   Result Date: 04/25/2022 CLINICAL DATA:  History of DVT; monitor anal cancer; * Tracking Code: BO * EXAM: CT ANGIOGRAPHY CHEST CT ABDOMEN AND PELVIS WITH CONTRAST TECHNIQUE: Multidetector CT imaging of the chest was performed  using the standard protocol during bolus administration of intravenous contrast. Multiplanar CT image reconstructions and MIPs were obtained to  evaluate the vascular anatomy. Multidetector CT imaging of the abdomen and pelvis was performed using the standard protocol during bolus administration of intravenous contrast. RADIATION DOSE REDUCTION: This exam was performed according to the departmental dose-optimization program which includes automated exposure control, adjustment of the mA and/or kV according to patient size and/or use of iterative reconstruction technique. CONTRAST:  75m OMNIPAQUE IOHEXOL 350 MG/ML SOLN COMPARISON:  None Available. FINDINGS: CTA CHEST FINDINGS Cardiovascular: Saddle pulmonary embolus of the main pulmonary artery and left and right pulmonary arteries, and bilateral lobar arteries. Elevated RV to LV ratio, 1.4. Normal heart size. No pericardial effusion. Normal caliber thoracic aorta with moderate atherosclerotic disease. Prior aortic valve replacement. Mitral annular calcifications. Moderate left main and three-vessel coronary artery calcifications. Mediastinum/Nodes: Esophagus and thyroid are unremarkable. Calcified mediastinal lymph nodes, likely sequela prior granulomatous infection. No pathologically enlarged lymph nodes seen in the chest. Lungs/Pleura: Central airways are patent. Bibasilar atelectasis. Branching, tubular high attenuation structure of the left lower lobe measuring 13 x 7 mm on series 5, image 199, finding was present on prior 2014 exam but has increased in size. Musculoskeletal: Prior median sternotomy with intact sternal wires. No acute or significant osseous findings. Review of the MIP images confirms the above findings. CT ABDOMEN and PELVIS FINDINGS Hepatobiliary: No focal liver abnormality is seen. Cholelithiasis with no gallbladder wall thickening. Pancreas: Unremarkable. No pancreatic ductal dilatation or surrounding inflammatory changes. Spleen: Normal in size without focal abnormality. Adrenals/Urinary Tract: Adrenal glands are unremarkable. No hydronephrosis. Punctate bilateral nonobstructing  renal stones. Bladder is unremarkable. Stomach/Bowel: Stomach is within normal limits. Appendix appears normal. Mild diverticulosis. No evidence of bowel wall thickening, distention, or inflammatory changes. Vascular/Lymphatic: Nonocclusive filling defect of the right common femoral vein. Aortic atherosclerosis. No enlarged abdominal or pelvic lymph nodes. Reproductive: Prostate is unremarkable. Other: Small right-greater-than-left fat containing inguinal hernias. No abdominopelvic ascites. Musculoskeletal: No acute or significant osseous findings. Review of the MIP images confirms the above findings. IMPRESSION: CHEST CTA: 1. Acute saddle pulmonary embolus of the main pulmonary artery, left and right pulmonary arteries, and bilateral lobar arteries. 2. Calcifications are seen in the bilateral distal right and left pulmonary arteries, likely post thrombotic change related to prior pulmonary embolus. 3. Elevated RV to LV ratio, findings can be seen in the setting of right heart strain. Correlate with echocardiography. 4. Branching, tubular high attenuation structure of the left lower lobe, compatible with a bronchocele, finding was present on 2014 exam, but has increased in size. ABDOMEN AND PELVIS CT: 1. Nonocclusive filling defect of the right common femoral vein, compatible with history of DVT. 2. No evidence of metastatic disease in the abdomen or pelvis. 3. Aortic Atherosclerosis (ICD10-I70.0). Patient instructed by tech to go to the ER and on-call provider has been paged at the time of this dictation 04/25/2022 at 19:37. Addendum will be placed when communication is documented. Electronically Signed: By: LYetta GlassmanM.D. On: 04/25/2022 20:00    Scheduled Meds:    aspirin EC  81 mg Oral Daily   Chlorhexidine Gluconate Cloth  6 each Topical Daily   DULoxetine  60 mg Oral QHS   insulin aspart  0-5 Units Subcutaneous QHS   insulin aspart  0-9 Units Subcutaneous TID WC   metoprolol tartrate  25 mg Oral  BID   mirabegron ER  25 mg Oral QHS   rosuvastatin  40 mg Oral  QHS    Continuous Infusions:    heparin 1,450 Units/hr (04/27/22 0744)     LOS: 1 day     Vernell Leep, MD,  FACP, [facility], [facility], [facility] (Care Management Physician Certified) [place]  To contact the attending provider between 7A-7P or the covering provider during after hours 7P-7A, please log into the web site [email or web address] and access using universal [place] password for that web site. If you do not have the password, please call the hospital operator.  04/27/2022, 1:53 PM

## 2022-04-27 NOTE — Progress Notes (Signed)
PCCM Interval Note  Chart reviewed.   LE dopplers + for acute RLE DVT Echo reviewed, RV systolic function and size normal.     Nothing further to add.  PCCM will sign off.  Please call us back if we can be of any further assistance.       Kennieth Rad, MSN, AG-ACNP-BC [facility] 04/27/2022, 11:51 AM

## 2022-04-27 NOTE — Plan of Care (Signed)
  Problem: Clinical Measurements: Goal: Cardiovascular complication will be avoided Outcome: Progressing   Problem: Activity: Goal: Risk for activity intolerance will decrease Outcome: Progressing   Problem: Nutrition: Goal: Adequate nutrition will be maintained Outcome: Progressing   Problem: Coping: Goal: Level of anxiety will decrease Outcome: Progressing   Problem: Elimination: Goal: Will not experience complications related to bowel motility Outcome: Progressing   

## 2022-04-27 NOTE — Progress Notes (Signed)
IP PROGRESS NOTE  Subjective:   [name] has no new complaint.  He is ambulating to restroom without difficulty.  Objective: Vital signs in last 24 hours: Blood pressure 123/64, pulse (!) 44, temperature 98.2 F (36.8 C), temperature source Oral, resp. rate 19, height '5\' 11"'$  (1.803 m), weight 238 lb 1.6 oz (108 kg), SpO2 92 %.  Intake/Output from previous day: 08/22 0701 - 08/23 0700 In: 723 [I.V.:723] Out: 250 [Urine:250]  Physical Exam:  Abdomen: No hepatosplenomegaly Extremities: Trace-1+ edema throughout the right leg  Lab Results: Recent Labs    04/25/22 2014 04/26/22 0233  WBC 8.2 8.2  HGB 14.6 13.5  HCT 45.4 41.9  PLT 180 161    BMET Recent Labs    04/25/22 2014 04/26/22 0233  NA 139 139  K 4.4 3.8  CL 100 106  CO2 29 23  GLUCOSE 101* 116*  BUN 17 15  CREATININE 1.54* 1.31*  CALCIUM 9.4 8.9    No results found for: "CEA1", "CEA", "CAN199", "CA125"  Studies/Results: CT Abdomen Pelvis W Contrast  Addendum Date: 04/25/2022   ADDENDUM REPORT: 04/25/2022 20:07 ADDENDUM: Critical Value/emergent results were called by telephone on 04/25/2022 at 8:06 pm to provider Kirk [name], who verbally acknowledged these results. Electronically Signed   By: Kirk [name] M.D.   On: 04/25/2022 20:07   Result Date: 04/25/2022 CLINICAL DATA:  History of DVT; monitor anal cancer; * Tracking Code: BO * EXAM: CT ANGIOGRAPHY CHEST CT ABDOMEN AND PELVIS WITH CONTRAST TECHNIQUE: Multidetector CT imaging of the chest was performed using the standard protocol during bolus administration of intravenous contrast. Multiplanar CT image reconstructions and MIPs were obtained to evaluate the vascular anatomy. Multidetector CT imaging of the abdomen and pelvis was performed using the standard protocol during bolus administration of intravenous contrast. RADIATION DOSE REDUCTION: This exam was performed according to the departmental dose-optimization program which includes automated exposure  control, adjustment of the mA and/or kV according to patient size and/or use of iterative reconstruction technique. CONTRAST:  7m OMNIPAQUE IOHEXOL 350 MG/ML SOLN COMPARISON:  None Available. FINDINGS: CTA CHEST FINDINGS Cardiovascular: Saddle pulmonary embolus of the main pulmonary artery and left and right pulmonary arteries, and bilateral lobar arteries. Elevated RV to LV ratio, 1.4. Normal heart size. No pericardial effusion. Normal caliber thoracic aorta with moderate atherosclerotic disease. Prior aortic valve replacement. Mitral annular calcifications. Moderate left main and three-vessel coronary artery calcifications. Mediastinum/Nodes: Esophagus and thyroid are unremarkable. Calcified mediastinal lymph nodes, likely sequela prior granulomatous infection. No pathologically enlarged lymph nodes seen in the chest. Lungs/Pleura: Central airways are patent. Bibasilar atelectasis. Branching, tubular high attenuation structure of the left lower lobe measuring 13 x 7 mm on series 5, image 199, finding was present on prior 2014 exam but has increased in size. Musculoskeletal: Prior median sternotomy with intact sternal wires. No acute or significant osseous findings. Review of the MIP images confirms the above findings. CT ABDOMEN and PELVIS FINDINGS Hepatobiliary: No focal liver abnormality is seen. Cholelithiasis with no gallbladder wall thickening. Pancreas: Unremarkable. No pancreatic ductal dilatation or surrounding inflammatory changes. Spleen: Normal in size without focal abnormality. Adrenals/Urinary Tract: Adrenal glands are unremarkable. No hydronephrosis. Punctate bilateral nonobstructing renal stones. Bladder is unremarkable. Stomach/Bowel: Stomach is within normal limits. Appendix appears normal. Mild diverticulosis. No evidence of bowel wall thickening, distention, or inflammatory changes. Vascular/Lymphatic: Nonocclusive filling defect of the right common femoral vein. Aortic atherosclerosis. No  enlarged abdominal or pelvic lymph nodes. Reproductive: Prostate is unremarkable. Other: Small right-greater-than-left fat  containing inguinal hernias. No abdominopelvic ascites. Musculoskeletal: No acute or significant osseous findings. Review of the MIP images confirms the above findings. IMPRESSION: CHEST CTA: 1. Acute saddle pulmonary embolus of the main pulmonary artery, left and right pulmonary arteries, and bilateral lobar arteries. 2. Calcifications are seen in the bilateral distal right and left pulmonary arteries, likely post thrombotic change related to prior pulmonary embolus. 3. Elevated RV to LV ratio, findings can be seen in the setting of right heart strain. Correlate with echocardiography. 4. Branching, tubular high attenuation structure of the left lower lobe, compatible with a bronchocele, finding was present on 2014 exam, but has increased in size. ABDOMEN AND PELVIS CT: 1. Nonocclusive filling defect of the right common femoral vein, compatible with history of DVT. 2. No evidence of metastatic disease in the abdomen or pelvis. 3. Aortic Atherosclerosis (ICD10-I70.0). Patient instructed by tech to go to the ER and on-call provider has been paged at the time of this dictation 04/25/2022 at 19:37. Addendum will be placed when communication is documented. Electronically Signed: By: Kirk [name] M.D. On: 04/25/2022 20:00   CT Angio Chest Pulmonary Embolism (PE) W or WO Contrast  Addendum Date: 04/25/2022   ADDENDUM REPORT: 04/25/2022 20:07 ADDENDUM: Critical Value/emergent results were called by telephone on 04/25/2022 at 8:06 pm to provider Kirk [name], who verbally acknowledged these results. Electronically Signed   By: Kirk [name] M.D.   On: 04/25/2022 20:07   Result Date: 04/25/2022 CLINICAL DATA:  History of DVT; monitor anal cancer; * Tracking Code: BO * EXAM: CT ANGIOGRAPHY CHEST CT ABDOMEN AND PELVIS WITH CONTRAST TECHNIQUE: Multidetector CT imaging of the chest was performed  using the standard protocol during bolus administration of intravenous contrast. Multiplanar CT image reconstructions and MIPs were obtained to evaluate the vascular anatomy. Multidetector CT imaging of the abdomen and pelvis was performed using the standard protocol during bolus administration of intravenous contrast. RADIATION DOSE REDUCTION: This exam was performed according to the departmental dose-optimization program which includes automated exposure control, adjustment of the mA and/or kV according to patient size and/or use of iterative reconstruction technique. CONTRAST:  20m OMNIPAQUE IOHEXOL 350 MG/ML SOLN COMPARISON:  None Available. FINDINGS: CTA CHEST FINDINGS Cardiovascular: Saddle pulmonary embolus of the main pulmonary artery and left and right pulmonary arteries, and bilateral lobar arteries. Elevated RV to LV ratio, 1.4. Normal heart size. No pericardial effusion. Normal caliber thoracic aorta with moderate atherosclerotic disease. Prior aortic valve replacement. Mitral annular calcifications. Moderate left main and three-vessel coronary artery calcifications. Mediastinum/Nodes: Esophagus and thyroid are unremarkable. Calcified mediastinal lymph nodes, likely sequela prior granulomatous infection. No pathologically enlarged lymph nodes seen in the chest. Lungs/Pleura: Central airways are patent. Bibasilar atelectasis. Branching, tubular high attenuation structure of the left lower lobe measuring 13 x 7 mm on series 5, image 199, finding was present on prior 2014 exam but has increased in size. Musculoskeletal: Prior median sternotomy with intact sternal wires. No acute or significant osseous findings. Review of the MIP images confirms the above findings. CT ABDOMEN and PELVIS FINDINGS Hepatobiliary: No focal liver abnormality is seen. Cholelithiasis with no gallbladder wall thickening. Pancreas: Unremarkable. No pancreatic ductal dilatation or surrounding inflammatory changes. Spleen: Normal in  size without focal abnormality. Adrenals/Urinary Tract: Adrenal glands are unremarkable. No hydronephrosis. Punctate bilateral nonobstructing renal stones. Bladder is unremarkable. Stomach/Bowel: Stomach is within normal limits. Appendix appears normal. Mild diverticulosis. No evidence of bowel wall thickening, distention, or inflammatory changes. Vascular/Lymphatic: Nonocclusive filling defect of the right common femoral  vein. Aortic atherosclerosis. No enlarged abdominal or pelvic lymph nodes. Reproductive: Prostate is unremarkable. Other: Small right-greater-than-left fat containing inguinal hernias. No abdominopelvic ascites. Musculoskeletal: No acute or significant osseous findings. Review of the MIP images confirms the above findings. IMPRESSION: CHEST CTA: 1. Acute saddle pulmonary embolus of the main pulmonary artery, left and right pulmonary arteries, and bilateral lobar arteries. 2. Calcifications are seen in the bilateral distal right and left pulmonary arteries, likely post thrombotic change related to prior pulmonary embolus. 3. Elevated RV to LV ratio, findings can be seen in the setting of right heart strain. Correlate with echocardiography. 4. Branching, tubular high attenuation structure of the left lower lobe, compatible with a bronchocele, finding was present on 2014 exam, but has increased in size. ABDOMEN AND PELVIS CT: 1. Nonocclusive filling defect of the right common femoral vein, compatible with history of DVT. 2. No evidence of metastatic disease in the abdomen or pelvis. 3. Aortic Atherosclerosis (ICD10-I70.0). Patient instructed by tech to go to the ER and on-call provider has been paged at the time of this dictation 04/25/2022 at 19:37. Addendum will be placed when communication is documented. Electronically Signed: By: Kirk [name] M.D. On: 04/25/2022 20:00    Medications: I have reviewed the patient's current medications.  Assessment/Plan:  1.Squamous cell carcinoma of the  anal canal, HPV positive.   - Staging CT/PET scan with tiny lung nodules, hypermetabolic retroperitoneal, right inguinal, and left supraclavicular nodes . FNA biopsy of a left supraclavicular lymph node on 05/03/2012 confirmed metastatic squamous cell carcinoma.   -Cycle 1 of 5-fluorouracil/mitomycin-C and concurrent radiation on 05/20/2012. Cycle 2 of 5-fluorouracil/mitomycin-C on 06/24/2012 with completion of radiation on 06/28/2012.   -Restaging PET scan 08/02/2012 with resolution of the hypermetabolic anal primary and right inguinal lymph node. Persistent hypermetabolic supraclavicular and retroperitoneal nodes.   -cycle 1 of salvage Taxol/carboplatin chemotherapy on 08/30/2012 . Restaging CTs after 4 cycles of Taxol/carboplatin confirmed resolution of retroperitoneal lymphadenopathy and a decrease in the size of a previously noted left supraclavicular node. He completed cycle 6 of Taxol/carboplatin on 01/02/2013.   -A negative flexible sigmoidoscopy 04/01/2013   -Staging CT scans 10/09/2013 with stable small left supraclavicular node, no evidence of progressive metastatic disease   -Negative colonoscopy 03/19/2015 -Colonoscopy 05/02/2018-no evidence of recurrent anal cancer, multiple tubular adenomas removed from the colon -Colonoscopy 06/30/2019-no evidence of recurrent anal cancer, multiple tubular adenomas removed 3. Hypermetabolic activity at the right supraglottic larynx on the PET scan 04/17/2012 with associated soft tissue fullness status post a negative ENT evaluation by Dr. Constance [name].   4. History of aortic stenosis-status post aortic valve replacement and coronary artery bypass surgery in November 2012.   5. Mucositis following cycle 1 chemotherapy. Resolved.   6. Skin breakdown at the perineum and penis secondary to chemotherapy and radiation. Resolved.   7. 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 Bone pain after Neulasta   9. Generalized seizure 09/24/2012-negative brain  CT,? Etiology. He has been evaluated by neurology and was prescribed Kirk [name].   10.history of Thrombocytopenia secondary chemotherapy  11. Peripheral neuropathy-predating chemotherapy   12. Erectile dysfunction-followed by Dr. Risa [name] 13.  Right leg DVT 03/28/2022-apixaban anticoagulation, pulmonary embolism on CT 04/25/2022 CT chest 04/25/2022-nonocclusive filling defect of the right common femoral vein, acute saddle pulmonary embolus of the main pulmonary artery, left and right pulmonary arteries, and bilateral lateral lobar arteries Admission 04/26/2022-heparin anticoagulation  Kirk [name] has a remote history of metastatic anal cancer.  He has remained in clinical remission  after completing treatment with Taxol/carboplatin chemotherapy in the spring 2014.  There is no clinical or radiologic evidence of recurrent anal cancer.  Kirk [name] appears unchanged.  He has been diagnosed with a right lower extremity DVT and pulmonary embolism.  There is no clear predisposing risk factor for pulmonary embolism.  He has no evidence of a malignancy.  The venous thrombosis may have been related to immobility in the setting of an upper respiratory infection in July.  It is possible his respiratory symptoms in July are related to pulmonary embolism.  I agree with the recommendation by Dr. Chase [name] to continue apixaban anticoagulation at discharge from the hospital.  I recommend indefinite anticoagulation therapy given the significant pulmonary clot burden and obesity.  We can consider obtaining a hypercoagulation panel, but this would not change the treatment recommendation.  Recommendations: Anticoagulation therapy per the pulmonary medicine service Decision on continuing aspirin per his cardiologist Please call oncology as needed, outpatient follow-up will be scheduled at the Cancer center   LOS: 0 days   Kirk Coder, MD   04/26/2022, 1:28 PM

## 2022-04-27 NOTE — Progress Notes (Signed)
[place] for heparin Indication: pulmonary embolus  Allergies  Allergen Reactions   Cat Hair Extract Shortness Of Breath and Swelling   Atorvastatin Other (See Comments)    Pt has muscle ache on Atorvastatin but IS ABLE TO TAKE LIPITOR without any issues   Dust Mite Extract Other (See Comments) and Swelling   Metformin Other (See Comments)    Diarrhea, muscle cramps   Oxycodone Other (See Comments)    Seizure PATIENT DOES NOT WANT ANY NARCOTICS   Semaglutide(0.25 Or 0.'5mg'$ -Dos) Nausea Only    Patient Measurements: Height: '5\' 11"'$  (180.3 cm) Weight: 108 kg (238 lb 1.6 oz) IBW/kg (Calculated) : 75.3 Heparin Dosing Weight: 97kg  Vital Signs: Temp: 97.7 F (36.5 C) (08/24 0400) Temp Source: Axillary (08/24 0400) BP: 138/67 (08/24 0400) Pulse Rate: 48 (08/24 0400)  Labs: Recent Labs    04/25/22 2014 04/25/22 2203 04/26/22 0233 04/26/22 0750 04/26/22 2014 04/27/22 0527  HGB 14.6  --  13.5  --   --  13.9  HCT 45.4  --  41.9  --   --  44.1  PLT 180  --  161  --   --  162  APTT  --   --   --  125* 83* 78*  HEPARINUNFRC  --   --   --  >1.10*  --  0.84*  CREATININE 1.54*  --  1.31*  --   --  1.18  TROPONINIHS 17   < > 16 13  --  11   < > = values in this interval not displayed.     Estimated Creatinine Clearance: 68.7 mL/min (by C-G formula based on SCr of 1.18 mg/dL).   Assessment: 76 year old male with saddle PE with right heart strain on CTA. Of note patient undergoing treatment for DVT on apixaban prior to admit with recent fill earlier this month and patient reports compliance. Primary service is planning hematology consult and orders received to transition over to heparin for now.   Will need to follow aptt and heparin levels until the labs correlate.    Today, 04/27/22 aPTT 78 seconds, therapeutic with heparin at 1450 units/hr  CBC stable No bleeding or infusion-related issues documented  Goal of Therapy:  Heparin  level 0.3-0.7 units/ml aPTT 66-102 seconds Monitor platelets by anticoagulation protocol: Yes   Plan:  Continue heparin at 1450 units/hr  Daily CBC, aPTT, HL  Monitor for signs and symptoms of bleeding   Tawnya Crook, PharmD, BCPS Clinical Pharmacist 04/27/2022 7:22 AM

## 2022-04-27 NOTE — Progress Notes (Signed)
Bilateral lower extremity venous duplex has been completed. Preliminary results can be found in CV Proc through chart review.  Results were given to the patient's nurse, Lysbeth Galas.  04/27/22 10:17 AM Kirk [name] RVT

## 2022-04-28 ENCOUNTER — Encounter (HOSPITAL_COMMUNITY): Payer: Self-pay | Admitting: Internal Medicine

## 2022-04-28 DIAGNOSIS — I2692 Saddle embolus of pulmonary artery without acute cor pulmonale: Secondary | ICD-10-CM | POA: Diagnosis not present

## 2022-04-28 LAB — CBC
HCT: 44.8 % (ref 39.0–52.0)
Hemoglobin: 14.3 g/dL (ref 13.0–17.0)
MCH: 31.9 pg (ref 26.0–34.0)
MCHC: 31.9 g/dL (ref 30.0–36.0)
MCV: 100 fL (ref 80.0–100.0)
Platelets: 158 10*3/uL (ref 150–400)
RBC: 4.48 MIL/uL (ref 4.22–5.81)
RDW: 15.2 % (ref 11.5–15.5)
WBC: 7.4 10*3/uL (ref 4.0–10.5)
nRBC: 0 % (ref 0.0–0.2)

## 2022-04-28 LAB — HEPARIN LEVEL (UNFRACTIONATED): Heparin Unfractionated: 0.69 IU/mL (ref 0.30–0.70)

## 2022-04-28 LAB — APTT: aPTT: 73 seconds — ABNORMAL HIGH (ref 24–36)

## 2022-04-28 LAB — GLUCOSE, CAPILLARY: Glucose-Capillary: 116 mg/dL — ABNORMAL HIGH (ref 70–99)

## 2022-04-28 MED ORDER — APIXABAN 5 MG PO TABS
5.0000 mg | ORAL_TABLET | Freq: Two times a day (BID) | ORAL | Status: DC
  Administered 2022-04-28 – 2022-04-29 (×2): 5 mg via ORAL
  Filled 2022-04-28 (×2): qty 1

## 2022-04-28 NOTE — Progress Notes (Signed)
PROGRESS NOTE   VICK FILTER  IOE:[id number]    DOB: Jan 25, 1946    DOA: 04/25/2022  PCP: Maury Dus, MD   I have briefly reviewed patients previous medical records in [facility].  Chief Complaint  Patient presents with   Shortness of Breath    Brief Narrative:  76 year old married male, PMH of remote history of metastatic anal cancer, diagnosed with a right lower extremity DVT in late July, possibly unprovoked,?  COVID-19 infection, was placed on Eliquis and reports compliance except missing dose for 1 day, aortic stenosis s/p valve replacement, CAD s/p CABG, postop A-fib, type II DM, HLD, HTN, seizures, seen by oncology on 8/14 and given DVT, cancer restaging CT study were ordered, underwent CTA chest and CT A/P on 8/22 which confirmed acute saddle PE and was advised by oncologist to present to the ED.  Admitted to stepdown.  Initiated on IV heparin drip.  Medical oncology and PCCM consulted.   Assessment & Plan:  Principal Problem:   Saddle pulmonary embolus (HCC) Active Problems:   Coronary artery disease involving native coronary artery of native heart without angina pectoris   History of anal cancer   Right leg DVT (HCC)   Essential hypertension   AKI (acute kidney injury) ([place])   Type 2 diabetes mellitus ([place])   Acute saddle pulmonary embolus/RLE DVT: RLE DVT diagnosed end of July 2023, started on Eliquis, claims compliance except for missing dose on 1 day, denies any new cardiorespiratory symptoms, medical oncologist obtained CTA chest and CT A/P for cancer staging which revealed the acute saddle PE of the main pulmonary artery, left and right pulmonary arteries, and bilateral lobar arteries, possible right heart strain and nonocclusive filling defect of the right common femoral vein compatible with history of DVT.  No metastatic disease in abdomen or pelvis.  Hemodynamically stable.  Not hypoxic.  As per discussion with Dr. Benay Spice, medical oncology, does not suspect  Eliquis failure and feels that the PE may have been present at the same time DVT was diagnosed.  PCCM consulted to see if he would be a candidate for any aggressive intervention i.e. thrombectomy or thrombolysis.  HS Troponin cycled and negative.  BNP unremarkable.  2D echo shows normal EF, grade 1 diastolic dysfunction, normal RV function.  As per CCM input, IV heparin x72 hours (supposed to complete early 8/26 AM), then can be transitioned back to Eliquis at discharge.  Lifelong anticoagulation per hematology.  Outpatient follow-up at the cancer center.  LEV Doppler confirms acute DVT involving right femoral, popliteal, peroneal and gastrocnemius veins.  Remained stable with current plans to transition from IV heparin to Eliquis tomorrow and discharged home.  RLE compression stockings.  Acute kidney injury: Baseline creatinine 1.0, presented with creatinine of 1.5.  Held losartan.  Avoiding nephrotoxic's.  Brief IV fluids.  Creatinine down to 1.18.  Can resume losartan at time of discharge.  Remote history of metastatic anal cancer Treated 10 years ago and restaging CT without evidence of metastatic disease in the abdomen or pelvis. -Dr.Sherrill, oncology follow-up appreciated.   CAD, s/p CABG Stable. -Continue aspirin, statin, beta-blocker.  However once switching back to DOAC if that is done, will need to reconsider if aspirin need to be continued or stopped.   Non-insulin-dependent type 2 diabetes Well-controlled - A1c 6.4. Since A1c and CBG checks were well controlled, discontinued CBG checks and SSI yesterday.  Outpatient follow-up.   Hypertension Stable. -Continue metoprolol -Hold losartan given recent AKI.  Losartan to  resume at discharge.  Body mass index is 33.21 kg/m.    DVT prophylaxis:   IV heparin drip.   Code Status: Full Code:  Family Communication: Spouse at bedside Disposition:  Status is: Inpatient.  DC home 8/26.  Order placed for transfer from stepdown to  telemetry on 8/23 PM but still in stepdown due to lack of telemetry beds.     Consultants:   Medical oncology PCCM  Procedures:     Antimicrobials:      Subjective:  No complaints.  Has ambulated in the bathroom without difficulty.  No chest pain or dyspnea.  Objective:   Vitals:   04/28/22 0000 04/28/22 0325 04/28/22 0800 04/28/22 0935  BP: 98/65  (!) 123/97 (!) 143/66  Pulse: 70  (!) 52 73  Resp: 20  17   Temp:  (!) 97.4 F (36.3 C) (!) 96.8 F (36 C)   TempSrc:  Oral Axillary   SpO2: 95%  93%   Weight:      Height:        General exam: Elderly male, moderately built and obese sitting up comfortably in reclining chair.  Patient's wife actually sleeping in his bed. Respiratory system: Clear to auscultation.  No increased work of breathing. Cardiovascular system: S1 & S2 heard, RRR. No JVD, murmurs, rubs, gallops or clicks. No pedal edema.  Telemetry personally reviewed: Sinus rhythm. Gastrointestinal system: Abdomen is nondistended, soft and nontender. No organomegaly or masses felt. Normal bowel sounds heard. Central nervous system: Alert and oriented. No focal neurological deficits. Extremities: Symmetric 5 x 5 power.  Right leg mildly asymmetrically swollen compared to left but otherwise no acute findings.  Compartments soft.  Unchanged and stable. Skin: No rashes, lesions or ulcers Psychiatry: Judgement and insight appear normal. Mood & affect appropriate.     Data Reviewed:   I have personally reviewed following labs and imaging studies   CBC: Recent Labs  Lab 04/25/22 2014 04/26/22 0233 04/27/22 0527 04/28/22 0309  WBC 8.2 8.2 6.4 7.4  NEUTROABS 5.6 5.3  --   --   HGB 14.6 13.5 13.9 14.3  HCT 45.4 41.9 44.1 44.8  MCV 99.3 99.5 100.5* 100.0  PLT 180 161 162 098    Basic Metabolic Panel: Recent Labs  Lab 04/25/22 2014 04/26/22 0233 04/27/22 0527  NA 139 139 139  K 4.4 3.8 4.3  CL 100 106 107  CO2 '29 23 27  '$ GLUCOSE 101* 116* 123*  BUN '17  15 15  '$ CREATININE 1.54* 1.31* 1.18  CALCIUM 9.4 8.9 9.0    Liver Function Tests: Recent Labs  Lab 04/25/22 2014 04/26/22 0233  AST 21 23  ALT 24 24  ALKPHOS 52 46  BILITOT 0.5 0.7  PROT 7.1 6.4*  ALBUMIN 4.3 3.7    CBG: Recent Labs  Lab 04/27/22 1227 04/27/22 2101 04/28/22 0830  GLUCAP 94 116* 116*    Microbiology Studies:   Recent Results (from the past 240 hour(s))  MRSA Next Gen by PCR, Nasal     Status: None   Collection Time: 04/25/22 10:59 PM   Specimen: Nasal Mucosa; Nasal Swab  Result Value Ref Range Status   MRSA by PCR Next Gen NOT DETECTED NOT DETECTED Final    Comment: (NOTE) The GeneXpert MRSA Assay (FDA approved for NASAL specimens only), is one component of a comprehensive MRSA colonization surveillance program. It is not intended to diagnose MRSA infection nor to guide or monitor treatment for MRSA infections. Test performance is not FDA approved  in patients less than 22 years old. Performed at [facility], [place] [address] [place], [place] [address]     Radiology Studies:  VAS [place] LOWER EXTREMITY VENOUS (DVT)  Result Date: 04/27/2022  Lower Venous DVT Study Patient Name:  Kirk [name] [facility]  Date of Exam:   04/27/2022 Medical Rec #: [id number]       Accession #:    [id number] Date of Birth: Mar 16, 1946       Patient Gender: M Patient Age:   65 years Exam Location:  [facility] Procedure:      VAS [place] LOWER EXTREMITY VENOUS (DVT) Referring Phys: Brand Males --------------------------------------------------------------------------------  Indications: Pulmonary embolism.  Risk Factors: Confirmed PE. Anticoagulation: Heparin. Limitations: Poor ultrasound/tissue interface. Comparison Study: No prior studies. Performing Technologist: Oliver Hum RVT  Examination Guidelines: A complete evaluation includes B-mode imaging, spectral Doppler, color Doppler, and power Doppler as needed of all accessible portions of each vessel.  Bilateral testing is considered an integral part of a complete examination. Limited examinations for reoccurring indications may be performed as noted. The reflux portion of the exam is performed with the patient in reverse Trendelenburg.  +---------+---------------+---------+-----------+----------+-----------------+ RIGHT    CompressibilityPhasicitySpontaneityPropertiesThrombus Aging    +---------+---------------+---------+-----------+----------+-----------------+ CFV      Full           Yes      Yes                                    +---------+---------------+---------+-----------+----------+-----------------+ SFJ      Full                                                           +---------+---------------+---------+-----------+----------+-----------------+ FV Prox  None           No       No                   Acute             +---------+---------------+---------+-----------+----------+-----------------+ FV Mid   None           No       No                   Acute             +---------+---------------+---------+-----------+----------+-----------------+ FV DistalNone           No       No                   Acute             +---------+---------------+---------+-----------+----------+-----------------+ PFV      Full                                                           +---------+---------------+---------+-----------+----------+-----------------+ POP      None           No       No  Acute             +---------+---------------+---------+-----------+----------+-----------------+ PTV      Full                                                           +---------+---------------+---------+-----------+----------+-----------------+ PERO     None                                         Acute             +---------+---------------+---------+-----------+----------+-----------------+ Gastroc  None                                          Age Indeterminate +---------+---------------+---------+-----------+----------+-----------------+   +---------+---------------+---------+-----------+----------+--------------+ LEFT     CompressibilityPhasicitySpontaneityPropertiesThrombus Aging +---------+---------------+---------+-----------+----------+--------------+ CFV      Full           Yes      Yes                                 +---------+---------------+---------+-----------+----------+--------------+ SFJ      Full                                                        +---------+---------------+---------+-----------+----------+--------------+ FV Prox  Full                                                        +---------+---------------+---------+-----------+----------+--------------+ FV Mid   Full                                                        +---------+---------------+---------+-----------+----------+--------------+ FV DistalFull                                                        +---------+---------------+---------+-----------+----------+--------------+ PFV      Full                                                        +---------+---------------+---------+-----------+----------+--------------+ POP      Full           Yes      Yes                                 +---------+---------------+---------+-----------+----------+--------------+  PTV      Full                                                        +---------+---------------+---------+-----------+----------+--------------+ PERO     Full                                                        +---------+---------------+---------+-----------+----------+--------------+     Summary: RIGHT: - Findings consistent with acute deep vein thrombosis involving the right femoral vein, right popliteal vein, right peroneal veins, and right gastrocnemius veins. - No cystic structure found in the popliteal fossa.  LEFT: -  There is no evidence of deep vein thrombosis in the lower extremity.  - No cystic structure found in the popliteal fossa.  *See table(s) above for measurements and observations. Electronically signed by Monica Martinez MD on 04/27/2022 at 1:05:54 PM.    Final    ECHOCARDIOGRAM COMPLETE  Result Date: 04/27/2022    ECHOCARDIOGRAM REPORT   Patient Name:   CHEIKH BRAMBLE Date of Exam: 04/27/2022 Medical Rec #:  [id number]      Height:       71.0 in Accession #:    [id number]     Weight:       238.1 lb Date of Birth:  1946/06/16      BSA:          2.271 m Patient Age:    63 years       BP:           149/77 mmHg Patient Gender: M              HR:           67 bpm. Exam Location:  Inpatient Procedure: 2D Echo, Cardiac Doppler, Color Doppler and Intracardiac            Opacification Agent Indications:    Pulmonary Embolism  History:        Patient has prior history of Echocardiogram examinations, most                 recent 07/15/2020. CHF; Risk Factors:Hypertension and Diabetes.                 Aortic Valve: 25 mm Magna Ease Pericardial Valve valve is                 present in the aortic position.  Sonographer:    Jefferey Pica Referring Phys: [id number] Revecca Nachtigal D Sherrine Salberg IMPRESSIONS  1. Left ventricular ejection fraction, by estimation, is 65 to 70%. The left ventricle has normal function. The left ventricle has no regional wall motion abnormalities. There is mild concentric left ventricular hypertrophy. Left ventricular diastolic parameters are consistent with Grade I diastolic dysfunction (impaired relaxation).  2. Right ventricular systolic function is normal. The right ventricular size is normal. Tricuspid regurgitation signal is inadequate for assessing PA pressure.  3. Left atrial size was moderately dilated.  4. The mitral valve is abnormal. No evidence of mitral valve regurgitation. Moderate mitral annular calcification.  5. The aortic valve has been repaired/replaced. Aortic valve regurgitation is not visualized.  There is a  25 mm Magna Ease Pericardial Valve valve present in the aortic position. Aortic valve mean gradient measures 16.0 mmHg. Prosthetic leaflets are not well visualized, however, mean gradient appears stable to slightly improved from prior study in 07/2020 (mean gradient 47mHg at that time).  6. Aortic dilatation noted. There is borderline dilatation of the ascending aorta, measuring 38 mm.  7. The inferior vena cava is normal in size with greater than 50% respiratory variability, suggesting right atrial pressure of 3 mmHg. Comparison(s): Compared to prior study on 07/2020, there is no significant change. Prior mean AoV gradient 269mg. FINDINGS  Left Ventricle: Left ventricular ejection fraction, by estimation, is 65 to 70%. The left ventricle has normal function. The left ventricle has no regional wall motion abnormalities. The left ventricular internal cavity size was normal in size. There is  mild concentric left ventricular hypertrophy. Left ventricular diastolic parameters are consistent with Grade I diastolic dysfunction (impaired relaxation). Right Ventricle: The right ventricular size is normal. No increase in right ventricular wall thickness. Right ventricular systolic function is normal. Tricuspid regurgitation signal is inadequate for assessing PA pressure. Left Atrium: Left atrial size was moderately dilated. Right Atrium: Right atrial size was normal in size. Pericardium: There is no evidence of pericardial effusion. Mitral Valve: The mitral valve is abnormal. There is mild thickening of the mitral valve leaflet(s). There is mild calcification of the mitral valve leaflet(s). Moderate mitral annular calcification. No evidence of mitral valve regurgitation. Tricuspid Valve: The tricuspid valve is normal in structure. Tricuspid valve regurgitation is trivial. Aortic Valve: The aortic valve has been repaired/replaced. Aortic valve regurgitation is not visualized. Aortic valve mean gradient measures  16.0 mmHg. Aortic valve peak gradient measures 33.9 mmHg. There is a 25 mm Magna Ease Pericardial Valve valve present in the aortic position. Pulmonic Valve: The pulmonic valve was not well visualized. Pulmonic valve regurgitation is not visualized. Aorta: Aortic dilatation noted. There is borderline dilatation of the ascending aorta, measuring 38 mm. Venous: The inferior vena cava is normal in size with greater than 50% respiratory variability, suggesting right atrial pressure of 3 mmHg. IAS/Shunts: The atrial septum is grossly normal.  LEFT VENTRICLE PLAX 2D LVIDd:         4.50 cm LVIDs:         2.40 cm LV PW:         1.30 cm LV IVS:        1.40 cm  IVC IVC diam: 2.20 cm LEFT ATRIUM             Index        RIGHT ATRIUM           Index LA diam:        4.40 cm 1.94 cm/m   RA Area:     19.20 cm LA Vol (A2C):   55.0 ml 24.22 ml/m  RA Volume:   55.40 ml  24.40 ml/m LA Vol (A4C):   94.0 ml 41.40 ml/m LA Biplane Vol: 79.6 ml 35.05 ml/m  AORTIC VALVE               PULMONIC VALVE AV Vmax:      291.00 cm/s  PV Vmax:       0.72 m/s AV Vmean:     172.667 cm/s PV Peak grad:  2.1 mmHg AV VTI:       0.587 m AV Peak Grad: 33.9 mmHg AV Mean Grad: 16.0 mmHg  AORTA Ao Root diam: 3.40 cm Ao Asc diam:  3.80 cm Gwyndolyn Kaufman MD Electronically signed by Gwyndolyn Kaufman MD Signature Date/Time: 04/27/2022/11:29:30 AM    Final     Scheduled Meds:    aspirin EC  81 mg Oral Daily   Chlorhexidine Gluconate Cloth  6 each Topical Daily   DULoxetine  60 mg Oral QHS   metoprolol tartrate  25 mg Oral BID   mirabegron ER  25 mg Oral QHS   rosuvastatin  40 mg Oral QHS    Continuous Infusions:    heparin 1,450 Units/hr (04/28/22 0100)     LOS: 2 days     Vernell Leep, MD,  FACP, [facility], [facility], [facility] (Care Management Physician Certified) [place]  To contact the attending provider between 7A-7P or the covering provider during after hours 7P-7A, please log into the web site  [email or web address] and access using universal [place] password for that web site. If you do not have the password, please call the hospital operator.  04/28/2022, 10:15 AM

## 2022-04-28 NOTE — Plan of Care (Signed)
  Problem: Clinical Measurements: Goal: Will remain free from infection Outcome: Progressing   Problem: Activity: Goal: Risk for activity intolerance will decrease Outcome: Progressing   Problem: Nutrition: Goal: Adequate nutrition will be maintained Outcome: Progressing   Problem: Safety: Goal: Ability to remain free from injury will improve Outcome: Progressing   

## 2022-04-28 NOTE — TOC Progression Note (Signed)
Transition of Care [facility]) - Progression Note    Patient Details  Name: Kirk [name] MRN: [id number] Date of Birth: 02/14/1946  Transition of Care [facility]) CM/SW Contact  Leeroy Cha, RN Phone Number: 04/28/2022, 7:59 AM  Clinical Narrative:    [phone] chart reviewed.  Following for toc needs.  Plan is to return home with self-care currently.     Barriers to Discharge: Continued Medical Work up  Expected Discharge Plan and Services     Discharge Planning Services: CM Consult   Living arrangements for the past 2 months: Single Family Home                                       Social Determinants of Health (SDOH) Interventions    Readmission Risk Interventions   No data to display

## 2022-04-28 NOTE — Progress Notes (Signed)
[place] for heparin Indication: pulmonary embolus  Allergies  Allergen Reactions   Cat Hair Extract Shortness Of Breath and Swelling   Atorvastatin Other (See Comments)    Pt has muscle ache on Atorvastatin but IS ABLE TO TAKE LIPITOR without any issues   Dust Mite Extract Other (See Comments) and Swelling   Metformin Other (See Comments)    Diarrhea, muscle cramps   Oxycodone Other (See Comments)    Seizure PATIENT DOES NOT WANT ANY NARCOTICS   Semaglutide(0.25 Or 0.'5mg'$ -Dos) Nausea Only    Patient Measurements: Height: '5\' 11"'$  (180.3 cm) Weight: 108 kg (238 lb 1.6 oz) IBW/kg (Calculated) : 75.3 Heparin Dosing Weight: 97kg  Vital Signs: Temp: 97.4 F (36.3 C) (08/25 0325) Temp Source: Oral (08/25 0325) BP: 123/97 (08/25 0800) Pulse Rate: 52 (08/25 0800)  Labs: Recent Labs    04/25/22 2014 04/25/22 2203 04/26/22 0233 04/26/22 0750 04/26/22 0750 04/26/22 2014 04/27/22 0527 04/28/22 0309  HGB 14.6  --  13.5  --   --   --  13.9 14.3  HCT 45.4  --  41.9  --   --   --  44.1 44.8  PLT 180  --  161  --   --   --  162 158  APTT  --   --   --  125*   < > 83* 78* 73*  HEPARINUNFRC  --   --   --  >1.10*  --   --  0.84* 0.69  CREATININE 1.54*  --  1.31*  --   --   --  1.18  --   TROPONINIHS 17   < > 16 13  --   --  11  --    < > = values in this interval not displayed.     Estimated Creatinine Clearance: 68.7 mL/min (by C-G formula based on SCr of 1.18 mg/dL).   Assessment: 76 year old male with saddle PE with right heart strain on CTA. Of note patient undergoing treatment for DVT on apixaban prior to admit with recent fill earlier this month and patient reports compliance. Primary service is planning hematology consult and orders received to transition over to heparin for now.   Will need to follow aptt and heparin levels until the labs correlate.    Today, 04/28/22 aPTT 73 seconds, therapeutic with heparin at 1450 units/hr   HL is 0.69, trending towards correlation with aPTT  CBC stable No bleeding or infusion-related issues documented  Goal of Therapy:  Heparin level 0.3-0.7 units/ml aPTT 66-102 seconds Monitor platelets by anticoagulation protocol: Yes   Plan:  Continue heparin at 1450 units/hr  Daily CBC, aPTT, HL  Monitor for signs and symptoms of bleeding   Royetta Asal, PharmD, BCPS 04/28/2022 9:33 AM

## 2022-04-29 DIAGNOSIS — I10 Essential (primary) hypertension: Secondary | ICD-10-CM

## 2022-04-29 DIAGNOSIS — N179 Acute kidney failure, unspecified: Secondary | ICD-10-CM

## 2022-04-29 DIAGNOSIS — I824Y1 Acute embolism and thrombosis of unspecified deep veins of right proximal lower extremity: Secondary | ICD-10-CM

## 2022-04-29 DIAGNOSIS — I2692 Saddle embolus of pulmonary artery without acute cor pulmonale: Secondary | ICD-10-CM | POA: Diagnosis not present

## 2022-04-29 LAB — CBC
HCT: 47 % (ref 39.0–52.0)
Hemoglobin: 15 g/dL (ref 13.0–17.0)
MCH: 32 pg (ref 26.0–34.0)
MCHC: 31.9 g/dL (ref 30.0–36.0)
MCV: 100.2 fL — ABNORMAL HIGH (ref 80.0–100.0)
Platelets: 171 10*3/uL (ref 150–400)
RBC: 4.69 MIL/uL (ref 4.22–5.81)
RDW: 15.4 % (ref 11.5–15.5)
WBC: 7.3 10*3/uL (ref 4.0–10.5)
nRBC: 0 % (ref 0.0–0.2)

## 2022-04-29 NOTE — Discharge Summary (Signed)
Physician Discharge Summary  Kirk [name] JSE:[id number] DOB: Mar 24, 1946  PCP: Maury Dus, MD  Admitted from: Home Discharged to: Home  Admit date: 04/25/2022 Discharge date: 04/29/2022  Recommendations for Outpatient Follow-up:    Follow-up Information     Maury Dus, MD. Schedule an appointment as soon as possible for a visit in 1 week(s).   Specialty: Family Medicine Why: To be seen with repeat labs (CBC & BMP). Contact information: [place] [address] [phone]         Jerline Pain, MD. Call on 05/01/2022.   Specialty: Cardiology Why: Please call MDs office on Monday to check if you need to continue or stop taking the aspirin while you are on long-term Eliquis anticoagulation. Contact information: [address] [place] [address] [phone]         Ladell Pier, MD Follow up on 07/06/2022.   Specialty: Oncology Why: Keep appointments for labs and MD follow-up. Contact information: [place] [address] [phone]                  Home Health: None    Equipment/Devices: None    Discharge Condition: Improved and stable.   Code Status: Full Code Diet recommendation:  Discharge Diet Orders (From admission, onward)     Start     Ordered   04/29/22 0000  Diet - low sodium heart healthy        04/29/22 1101   04/29/22 0000  Diet Carb Modified        04/29/22 1101             Discharge Diagnoses:  Principal Problem:   Saddle pulmonary embolus ([place]) Active Problems:   Coronary artery disease involving native coronary artery of native heart without angina pectoris   History of anal cancer   Right leg DVT (H[facility])   Essential hypertension   AKI (acute kidney injury) ([place])   Type 2 diabetes mellitus ([place])   Brief Summary: 76 year old married male, PMH of remote history of metastatic anal cancer, diagnosed with a right lower extremity DVT in late  July, possibly unprovoked,?  COVID-19 infection, was placed on Eliquis and reports compliance except missing dose for 1 day, aortic stenosis s/p valve replacement, CAD s/p CABG, postop A-fib, type II DM, HLD, HTN, seizures, seen by oncology on 8/14 and given DVT, cancer restaging CT study were ordered, underwent CTA chest and CT A/P on 8/22 which confirmed acute saddle PE and was advised by oncologist to present to the ED.  Admitted to stepdown.  Initiated on IV heparin drip, completed 72 hours and transitioned back to home dose of Eliquis.  Medical oncology and PCCM consulted.  Etiology of VTE unclear,?  Related to relative immobility (drive to the beach) and obesity.  Oncologist recommends lifelong anticoagulation.     Assessment & Plan:    Acute saddle pulmonary embolus/RLE DVT: RLE DVT diagnosed end of July 2023, started on Eliquis, claims compliance except for missing dose on 1 day, denies any new cardiorespiratory symptoms, medical oncologist obtained CTA chest and CT A/P for cancer staging which revealed the acute saddle PE of the main pulmonary artery, left and right pulmonary arteries, and bilateral lobar arteries, possible right heart strain and nonocclusive filling defect of the right common femoral vein compatible with history of DVT.  No metastatic disease in abdomen or pelvis.  Hemodynamically stable.  Not hypoxic.  As per medical oncology and pulmonology,  do not suspect Eliquis failure and feel that the PE may have been present at the same time DVT was diagnosed.  PCCM consulted to see if he would be a candidate for any aggressive intervention i.e. thrombectomy or thrombolysis.  HS Troponin cycled and negative.  BNP unremarkable.  2D echo shows normal EF, grade 1 diastolic dysfunction, normal RV function.  As per CCM input, IV heparin x72 hours-completed last evening, then can be transitioned back to Eliquis at discharge.  Lifelong anticoagulation per hematology.  Outpatient follow-up at the  cancer center.  LEV Doppler confirms acute DVT involving right femoral, popliteal, peroneal and gastrocnemius veins.  RLE compression stockings.  Advised to gradually increase activity and avoid strenuous activity.  They both verbalized understanding.   Acute kidney injury: Baseline creatinine 1.0, presented with creatinine of 1.5.  Held losartan.  Avoiding nephrotoxic's.  Brief IV fluids.  Creatinine down to 1.18.  Resumed losartan at time of discharge.  Follow BMP closely as outpatient.   Remote history of metastatic anal cancer Treated 10 years ago and restaging CT without evidence of metastatic disease in the abdomen or pelvis. -Dr.Sherrill, oncology follow-up appreciated.  Has outpatient follow-up appointment in November.   CAD, s/p CABG/prior aortic valve replacement in 2012 due to severe aortic stenosis with concomitant single-vessel bypass LIMA to LAD. Stable. -Continue aspirin, statin, beta-blocker.  Advised patient and spouse to call Dr. Marlou Porch office on Monday, 05/01/2022 to check if he needs to continue low-dose aspirin or stop it while he is on lifelong anticoagulation with Eliquis.  They verbalized understanding.   Non-insulin-dependent type 2 diabetes Well-controlled - A1c 6.4. Outpatient follow-up.   Hypertension Stable. -Continue metoprolol -Hold losartan given recent AKI.  Losartan resumed at discharge.  Borderline dilatation of ascending aorta, 38 mm: Noted on 2D echo.  Outpatient follow-up.   Body mass index is 33.21 kg/m.      Consultants:   Medical oncology PCCM   Procedures:      Discharge Instructions  Discharge Instructions     Call MD for:  difficulty breathing, headache or visual disturbances   Complete by: As directed    Call MD for:  extreme fatigue   Complete by: As directed    Call MD for:  persistant dizziness or light-headedness   Complete by: As directed    Call MD for:  severe uncontrolled pain   Complete by: As directed    Diet - low  sodium heart healthy   Complete by: As directed    Diet Carb Modified   Complete by: As directed    Increase activity slowly   Complete by: As directed         Medication List     TAKE these medications    amitriptyline 25 MG tablet Commonly known as: ELAVIL Take 25 mg by mouth at bedtime as needed for sleep.   aspirin EC 81 MG tablet Take 81 mg by mouth daily.   Cholecalciferol 25 MCG (1000 UT) tablet Take 1,000 Units by mouth daily.   DULoxetine 60 MG capsule Commonly known as: CYMBALTA Take 60 mg by mouth at bedtime.   Eliquis 5 MG Tabs tablet Generic drug: apixaban Take 5 mg by mouth in the morning and at bedtime.   Farxiga 5 MG Tabs tablet Generic drug: dapagliflozin propanediol Take 5 mg by mouth daily.   Lipo-Flavonoid Plus Tabs Take 1 tablet by mouth 3 (three) times daily after meals.   losartan 50 MG tablet Commonly known as: COZAAR Take  1 tablet (50 mg total) by mouth daily.   metoprolol tartrate 25 MG tablet Commonly known as: LOPRESSOR TAKE 1 TABLET BY MOUTH TWICE A DAY What changed:  how much to take how to take this when to take this additional instructions   Myrbetriq 25 MG Tb24 tablet Generic drug: mirabegron ER Take 25 mg by mouth at bedtime.   rosuvastatin 40 MG tablet Commonly known as: CRESTOR Take 40 mg by mouth at bedtime.   VISINE OP Place 1 drop into both eyes as needed (for redness/itching). Visine S   ZINC-220 PO Take 220 mg by mouth daily.       Allergies  Allergen Reactions   Cat Hair Extract Shortness Of Breath and Swelling   Atorvastatin Other (See Comments)    Pt has muscle ache on Atorvastatin but IS ABLE TO TAKE LIPITOR without any issues   Dust Mite Extract Other (See Comments) and Swelling   Metformin Other (See Comments)    Diarrhea, muscle cramps   Oxycodone Other (See Comments)    Seizure PATIENT DOES NOT WANT ANY NARCOTICS   Semaglutide(0.25 Or 0.'5mg'$ -Dos) Nausea Only       Procedures/Studies: VAS [place] LOWER EXTREMITY VENOUS (DVT)  Result Date: 04/27/2022  Lower Venous DVT Study Patient Name:  JAVIS ABBOUD  Date of Exam:   04/27/2022 Medical Rec #: [id number]       Accession #:    [id number] Date of Birth: 13-Aug-1946       Patient Gender: M Patient Age:   61 years Exam Location:  [facility] Procedure:      VAS [place] LOWER EXTREMITY VENOUS (DVT) Referring Phys: Brand Males --------------------------------------------------------------------------------  Indications: Pulmonary embolism.  Risk Factors: Confirmed PE. Anticoagulation: Heparin. Limitations: Poor ultrasound/tissue interface. Comparison Study: No prior studies. Performing Technologist: Oliver Hum RVT  Examination Guidelines: A complete evaluation includes B-mode imaging, spectral Doppler, color Doppler, and power Doppler as needed of all accessible portions of each vessel. Bilateral testing is considered an integral part of a complete examination. Limited examinations for reoccurring indications may be performed as noted. The reflux portion of the exam is performed with the patient in reverse Trendelenburg.  +---------+---------------+---------+-----------+----------+-----------------+ RIGHT    CompressibilityPhasicitySpontaneityPropertiesThrombus Aging    +---------+---------------+---------+-----------+----------+-----------------+ CFV      Full           Yes      Yes                                    +---------+---------------+---------+-----------+----------+-----------------+ SFJ      Full                                                           +---------+---------------+---------+-----------+----------+-----------------+ FV Prox  None           No       No                   Acute             +---------+---------------+---------+-----------+----------+-----------------+ FV Mid   None           No       No  Acute              +---------+---------------+---------+-----------+----------+-----------------+ FV DistalNone           No       No                   Acute             +---------+---------------+---------+-----------+----------+-----------------+ PFV      Full                                                           +---------+---------------+---------+-----------+----------+-----------------+ POP      None           No       No                   Acute             +---------+---------------+---------+-----------+----------+-----------------+ PTV      Full                                                           +---------+---------------+---------+-----------+----------+-----------------+ PERO     None                                         Acute             +---------+---------------+---------+-----------+----------+-----------------+ Gastroc  None                                         Age Indeterminate +---------+---------------+---------+-----------+----------+-----------------+   +---------+---------------+---------+-----------+----------+--------------+ LEFT     CompressibilityPhasicitySpontaneityPropertiesThrombus Aging +---------+---------------+---------+-----------+----------+--------------+ CFV      Full           Yes      Yes                                 +---------+---------------+---------+-----------+----------+--------------+ SFJ      Full                                                        +---------+---------------+---------+-----------+----------+--------------+ FV Prox  Full                                                        +---------+---------------+---------+-----------+----------+--------------+ FV Mid   Full                                                        +---------+---------------+---------+-----------+----------+--------------+  FV DistalFull                                                         +---------+---------------+---------+-----------+----------+--------------+ PFV      Full                                                        +---------+---------------+---------+-----------+----------+--------------+ POP      Full           Yes      Yes                                 +---------+---------------+---------+-----------+----------+--------------+ PTV      Full                                                        +---------+---------------+---------+-----------+----------+--------------+ PERO     Full                                                        +---------+---------------+---------+-----------+----------+--------------+     Summary: RIGHT: - Findings consistent with acute deep vein thrombosis involving the right femoral vein, right popliteal vein, right peroneal veins, and right gastrocnemius veins. - No cystic structure found in the popliteal fossa.  LEFT: - There is no evidence of deep vein thrombosis in the lower extremity.  - No cystic structure found in the popliteal fossa.  *See table(s) above for measurements and observations. Electronically signed by Monica Martinez MD on 04/27/2022 at 1:05:54 PM.    Final    ECHOCARDIOGRAM COMPLETE  Result Date: 04/27/2022    ECHOCARDIOGRAM REPORT   Patient Name:   Kirk [name] Date of Exam: 04/27/2022 Medical Rec #:  [id number]      Height:       71.0 in Accession #:    [id number]     Weight:       238.1 lb Date of Birth:  1945-11-14      BSA:          2.271 m Patient Age:    65 years       BP:           149/77 mmHg Patient Gender: M              HR:           67 bpm. Exam Location:  Inpatient Procedure: 2D Echo, Cardiac Doppler, Color Doppler and Intracardiac            Opacification Agent Indications:    Pulmonary Embolism  History:        Patient has prior history of Echocardiogram examinations, most                 recent 07/15/2020. CHF;  Risk Factors:Hypertension and Diabetes.                 Aortic Valve:  25 mm Magna Ease Pericardial Valve valve is                 present in the aortic position.  Sonographer:    Jefferey Pica Referring Phys: [id number] Avyn Coate D Myiah Petkus IMPRESSIONS  1. Left ventricular ejection fraction, by estimation, is 65 to 70%. The left ventricle has normal function. The left ventricle has no regional wall motion abnormalities. There is mild concentric left ventricular hypertrophy. Left ventricular diastolic parameters are consistent with Grade I diastolic dysfunction (impaired relaxation).  2. Right ventricular systolic function is normal. The right ventricular size is normal. Tricuspid regurgitation signal is inadequate for assessing PA pressure.  3. Left atrial size was moderately dilated.  4. The mitral valve is abnormal. No evidence of mitral valve regurgitation. Moderate mitral annular calcification.  5. The aortic valve has been repaired/replaced. Aortic valve regurgitation is not visualized. There is a 25 mm Magna Ease Pericardial Valve valve present in the aortic position. Aortic valve mean gradient measures 16.0 mmHg. Prosthetic leaflets are not well visualized, however, mean gradient appears stable to slightly improved from prior study in 07/2020 (mean gradient 100mHg at that time).  6. Aortic dilatation noted. There is borderline dilatation of the ascending aorta, measuring 38 mm.  7. The inferior vena cava is normal in size with greater than 50% respiratory variability, suggesting right atrial pressure of 3 mmHg. Comparison(s): Compared to prior study on 07/2020, there is no significant change. Prior mean AoV gradient 22mg. FINDINGS  Left Ventricle: Left ventricular ejection fraction, by estimation, is 65 to 70%. The left ventricle has normal function. The left ventricle has no regional wall motion abnormalities. The left ventricular internal cavity size was normal in size. There is  mild concentric left ventricular hypertrophy. Left ventricular diastolic parameters are consistent  with Grade I diastolic dysfunction (impaired relaxation). Right Ventricle: The right ventricular size is normal. No increase in right ventricular wall thickness. Right ventricular systolic function is normal. Tricuspid regurgitation signal is inadequate for assessing PA pressure. Left Atrium: Left atrial size was moderately dilated. Right Atrium: Right atrial size was normal in size. Pericardium: There is no evidence of pericardial effusion. Mitral Valve: The mitral valve is abnormal. There is mild thickening of the mitral valve leaflet(s). There is mild calcification of the mitral valve leaflet(s). Moderate mitral annular calcification. No evidence of mitral valve regurgitation. Tricuspid Valve: The tricuspid valve is normal in structure. Tricuspid valve regurgitation is trivial. Aortic Valve: The aortic valve has been repaired/replaced. Aortic valve regurgitation is not visualized. Aortic valve mean gradient measures 16.0 mmHg. Aortic valve peak gradient measures 33.9 mmHg. There is a 25 mm Magna Ease Pericardial Valve valve present in the aortic position. Pulmonic Valve: The pulmonic valve was not well visualized. Pulmonic valve regurgitation is not visualized. Aorta: Aortic dilatation noted. There is borderline dilatation of the ascending aorta, measuring 38 mm. Venous: The inferior vena cava is normal in size with greater than 50% respiratory variability, suggesting right atrial pressure of 3 mmHg. IAS/Shunts: The atrial septum is grossly normal.  LEFT VENTRICLE PLAX 2D LVIDd:         4.50 cm LVIDs:         2.40 cm LV PW:         1.30 cm LV IVS:        1.40 cm  IVC IVC diam: 2.20  cm LEFT ATRIUM             Index        RIGHT ATRIUM           Index LA diam:        4.40 cm 1.94 cm/m   RA Area:     19.20 cm LA Vol (A2C):   55.0 ml 24.22 ml/m  RA Volume:   55.40 ml  24.40 ml/m LA Vol (A4C):   94.0 ml 41.40 ml/m LA Biplane Vol: 79.6 ml 35.05 ml/m  AORTIC VALVE               PULMONIC VALVE AV Vmax:      291.00  cm/s  PV Vmax:       0.72 m/s AV Vmean:     172.667 cm/s PV Peak grad:  2.1 mmHg AV VTI:       0.587 m AV Peak Grad: 33.9 mmHg AV Mean Grad: 16.0 mmHg  AORTA Ao Root diam: 3.40 cm Ao Asc diam:  3.80 cm Gwyndolyn Kaufman MD Electronically signed by Gwyndolyn Kaufman MD Signature Date/Time: 04/27/2022/11:29:30 AM    Final    CT Abdomen Pelvis W Contrast  Addendum Date: 04/25/2022   ADDENDUM REPORT: 04/25/2022 20:07 ADDENDUM: Critical Value/emergent results were called by telephone on 04/25/2022 at 8:06 pm to provider Dr. Lorenso Courier, who verbally acknowledged these results. Electronically Signed   By: Yetta Glassman M.D.   On: 04/25/2022 20:07   Result Date: 04/25/2022 CLINICAL DATA:  History of DVT; monitor anal cancer; * Tracking Code: BO * EXAM: CT ANGIOGRAPHY CHEST CT ABDOMEN AND PELVIS WITH CONTRAST TECHNIQUE: Multidetector CT imaging of the chest was performed using the standard protocol during bolus administration of intravenous contrast. Multiplanar CT image reconstructions and MIPs were obtained to evaluate the vascular anatomy. Multidetector CT imaging of the abdomen and pelvis was performed using the standard protocol during bolus administration of intravenous contrast. RADIATION DOSE REDUCTION: This exam was performed according to the departmental dose-optimization program which includes automated exposure control, adjustment of the mA and/or kV according to patient size and/or use of iterative reconstruction technique. CONTRAST:  95m OMNIPAQUE IOHEXOL 350 MG/ML SOLN COMPARISON:  None Available. FINDINGS: CTA CHEST FINDINGS Cardiovascular: Saddle pulmonary embolus of the main pulmonary artery and left and right pulmonary arteries, and bilateral lobar arteries. Elevated RV to LV ratio, 1.4. Normal heart size. No pericardial effusion. Normal caliber thoracic aorta with moderate atherosclerotic disease. Prior aortic valve replacement. Mitral annular calcifications. Moderate left main and three-vessel  coronary artery calcifications. Mediastinum/Nodes: Esophagus and thyroid are unremarkable. Calcified mediastinal lymph nodes, likely sequela prior granulomatous infection. No pathologically enlarged lymph nodes seen in the chest. Lungs/Pleura: Central airways are patent. Bibasilar atelectasis. Branching, tubular high attenuation structure of the left lower lobe measuring 13 x 7 mm on series 5, image 199, finding was present on prior 2014 exam but has increased in size. Musculoskeletal: Prior median sternotomy with intact sternal wires. No acute or significant osseous findings. Review of the MIP images confirms the above findings. CT ABDOMEN and PELVIS FINDINGS Hepatobiliary: No focal liver abnormality is seen. Cholelithiasis with no gallbladder wall thickening. Pancreas: Unremarkable. No pancreatic ductal dilatation or surrounding inflammatory changes. Spleen: Normal in size without focal abnormality. Adrenals/Urinary Tract: Adrenal glands are unremarkable. No hydronephrosis. Punctate bilateral nonobstructing renal stones. Bladder is unremarkable. Stomach/Bowel: Stomach is within normal limits. Appendix appears normal. Mild diverticulosis. No evidence of bowel wall thickening, distention, or inflammatory changes. Vascular/Lymphatic: Nonocclusive filling defect  of the right common femoral vein. Aortic atherosclerosis. No enlarged abdominal or pelvic lymph nodes. Reproductive: Prostate is unremarkable. Other: Small right-greater-than-left fat containing inguinal hernias. No abdominopelvic ascites. Musculoskeletal: No acute or significant osseous findings. Review of the MIP images confirms the above findings. IMPRESSION: CHEST CTA: 1. Acute saddle pulmonary embolus of the main pulmonary artery, left and right pulmonary arteries, and bilateral lobar arteries. 2. Calcifications are seen in the bilateral distal right and left pulmonary arteries, likely post thrombotic change related to prior pulmonary embolus. 3. Elevated  RV to LV ratio, findings can be seen in the setting of right heart strain. Correlate with echocardiography. 4. Branching, tubular high attenuation structure of the left lower lobe, compatible with a bronchocele, finding was present on 2014 exam, but has increased in size. ABDOMEN AND PELVIS CT: 1. Nonocclusive filling defect of the right common femoral vein, compatible with history of DVT. 2. No evidence of metastatic disease in the abdomen or pelvis. 3. Aortic Atherosclerosis (ICD10-I70.0). Patient instructed by tech to go to the ER and on-call provider has been paged at the time of this dictation 04/25/2022 at 19:37. Addendum will be placed when communication is documented. Electronically Signed: By: Yetta Glassman M.D. On: 04/25/2022 20:00   CT Angio Chest Pulmonary Embolism (PE) W or WO Contrast  Addendum Date: 04/25/2022   ADDENDUM REPORT: 04/25/2022 20:07 ADDENDUM: Critical Value/emergent results were called by telephone on 04/25/2022 at 8:06 pm to provider Dr. Lorenso Courier, who verbally acknowledged these results. Electronically Signed   By: Yetta Glassman M.D.   On: 04/25/2022 20:07   Result Date: 04/25/2022 CLINICAL DATA:  History of DVT; monitor anal cancer; * Tracking Code: BO * EXAM: CT ANGIOGRAPHY CHEST CT ABDOMEN AND PELVIS WITH CONTRAST TECHNIQUE: Multidetector CT imaging of the chest was performed using the standard protocol during bolus administration of intravenous contrast. Multiplanar CT image reconstructions and MIPs were obtained to evaluate the vascular anatomy. Multidetector CT imaging of the abdomen and pelvis was performed using the standard protocol during bolus administration of intravenous contrast. RADIATION DOSE REDUCTION: This exam was performed according to the departmental dose-optimization program which includes automated exposure control, adjustment of the mA and/or kV according to patient size and/or use of iterative reconstruction technique. CONTRAST:  37m OMNIPAQUE IOHEXOL  350 MG/ML SOLN COMPARISON:  None Available. FINDINGS: CTA CHEST FINDINGS Cardiovascular: Saddle pulmonary embolus of the main pulmonary artery and left and right pulmonary arteries, and bilateral lobar arteries. Elevated RV to LV ratio, 1.4. Normal heart size. No pericardial effusion. Normal caliber thoracic aorta with moderate atherosclerotic disease. Prior aortic valve replacement. Mitral annular calcifications. Moderate left main and three-vessel coronary artery calcifications. Mediastinum/Nodes: Esophagus and thyroid are unremarkable. Calcified mediastinal lymph nodes, likely sequela prior granulomatous infection. No pathologically enlarged lymph nodes seen in the chest. Lungs/Pleura: Central airways are patent. Bibasilar atelectasis. Branching, tubular high attenuation structure of the left lower lobe measuring 13 x 7 mm on series 5, image 199, finding was present on prior 2014 exam but has increased in size. Musculoskeletal: Prior median sternotomy with intact sternal wires. No acute or significant osseous findings. Review of the MIP images confirms the above findings. CT ABDOMEN and PELVIS FINDINGS Hepatobiliary: No focal liver abnormality is seen. Cholelithiasis with no gallbladder wall thickening. Pancreas: Unremarkable. No pancreatic ductal dilatation or surrounding inflammatory changes. Spleen: Normal in size without focal abnormality. Adrenals/Urinary Tract: Adrenal glands are unremarkable. No hydronephrosis. Punctate bilateral nonobstructing renal stones. Bladder is unremarkable. Stomach/Bowel: Stomach is within normal limits. Appendix  appears normal. Mild diverticulosis. No evidence of bowel wall thickening, distention, or inflammatory changes. Vascular/Lymphatic: Nonocclusive filling defect of the right common femoral vein. Aortic atherosclerosis. No enlarged abdominal or pelvic lymph nodes. Reproductive: Prostate is unremarkable. Other: Small right-greater-than-left fat containing inguinal hernias.  No abdominopelvic ascites. Musculoskeletal: No acute or significant osseous findings. Review of the MIP images confirms the above findings. IMPRESSION: CHEST CTA: 1. Acute saddle pulmonary embolus of the main pulmonary artery, left and right pulmonary arteries, and bilateral lobar arteries. 2. Calcifications are seen in the bilateral distal right and left pulmonary arteries, likely post thrombotic change related to prior pulmonary embolus. 3. Elevated RV to LV ratio, findings can be seen in the setting of right heart strain. Correlate with echocardiography. 4. Branching, tubular high attenuation structure of the left lower lobe, compatible with a bronchocele, finding was present on 2014 exam, but has increased in size. ABDOMEN AND PELVIS CT: 1. Nonocclusive filling defect of the right common femoral vein, compatible with history of DVT. 2. No evidence of metastatic disease in the abdomen or pelvis. 3. Aortic Atherosclerosis (ICD10-I70.0). Patient instructed by tech to go to the ER and on-call provider has been paged at the time of this dictation 04/25/2022 at 19:37. Addendum will be placed when communication is documented. Electronically Signed: By: Yetta Glassman M.D. On: 04/25/2022 20:00      Subjective: Denies complains as he has for several days now.  Right leg swelling has improved after compression stockings.  Discharge Exam:  Vitals:   04/28/22 1407 04/28/22 1811 04/28/22 2318 04/29/22 0549  BP: 134/72 136/66 (!) 140/77   Pulse: 70 67 77   Resp: '16 16 19   '$ Temp: 97.9 F (36.6 C) 97.9 F (36.6 C) 98.2 F (36.8 C) 98.3 F (36.8 C)  TempSrc: Oral Oral Oral Oral  SpO2: 94% 97% 92% 95%  Weight:      Height:        General exam: Elderly male, moderately built and obese sitting up comfortably in reclining chair.   Respiratory system: Clear to auscultation.  No increased work of breathing. Cardiovascular system: S1 & S2 heard, RRR. No JVD, murmurs, rubs, gallops or clicks. No pedal edema.   Telemetry personally reviewed: SB in the 50s-SR. Gastrointestinal system: Abdomen is nondistended, soft and nontender. No organomegaly or masses felt. Normal bowel sounds heard. Central nervous system: Alert and oriented. No focal neurological deficits. Extremities: Symmetric 5 x 5 power.  Right leg with compression stocking and swelling has improved.  Compartments soft.   Skin: No rashes, lesions or ulcers Psychiatry: Judgement and insight appear normal. Mood & affect appropriate.     The results of significant diagnostics from this hospitalization (including imaging, microbiology, ancillary and laboratory) are listed below for reference.     Microbiology: Recent Results (from the past 240 hour(s))  MRSA Next Gen by PCR, Nasal     Status: None   Collection Time: 04/25/22 10:59 PM   Specimen: Nasal Mucosa; Nasal Swab  Result Value Ref Range Status   MRSA by PCR Next Gen NOT DETECTED NOT DETECTED Final    Comment: (NOTE) The GeneXpert MRSA Assay (FDA approved for NASAL specimens only), is one component of a comprehensive MRSA colonization surveillance program. It is not intended to diagnose MRSA infection nor to guide or monitor treatment for MRSA infections. Test performance is not FDA approved in patients less than 29 years old. Performed at [facility], [place] [address], [place], [place] [address]  Labs: CBC: Recent Labs  Lab 04/25/22 2014 04/26/22 0233 04/27/22 0527 04/28/22 0309 04/29/22 0430  WBC 8.2 8.2 6.4 7.4 7.3  NEUTROABS 5.6 5.3  --   --   --   HGB 14.6 13.5 13.9 14.3 15.0  HCT 45.4 41.9 44.1 44.8 47.0  MCV 99.3 99.5 100.5* 100.0 100.2*  PLT 180 161 162 158 719    Basic Metabolic Panel: Recent Labs  Lab 04/25/22 2014 04/26/22 0233 04/27/22 0527  NA 139 139 139  K 4.4 3.8 4.3  CL 100 106 107  CO2 '29 23 27  '$ GLUCOSE 101* 116* 123*  BUN '17 15 15  '$ CREATININE 1.54* 1.31* 1.18  CALCIUM 9.4 8.9 9.0    Liver Function  Tests: Recent Labs  Lab 04/25/22 2014 04/26/22 0233  AST 21 23  ALT 24 24  ALKPHOS 52 46  BILITOT 0.5 0.7  PROT 7.1 6.4*  ALBUMIN 4.3 3.7    CBG: Recent Labs  Lab 04/26/22 2118 04/27/22 0743 04/27/22 1227 04/27/22 2101 04/28/22 0830  GLUCAP 114* 125* 94 116* 116*    Discussed in detail with patient's spouse at bedside, updated care and answered all questions.   Time coordinating discharge: 25 minutes  SIGNED:  Vernell Leep, MD,  FACP, [facility], [facility], [facility] (Care Management Physician Certified). [facility]  To contact the attending provider between 7A-7P or the covering provider during after hours 7P-7A, please log into the web site [email or web address] and access using universal [place] password for that web site. If you do not have the password, please call the hospital operator.

## 2022-04-29 NOTE — Discharge Instructions (Signed)

## 2022-05-01 ENCOUNTER — Telehealth: Payer: Self-pay | Admitting: Cardiology

## 2022-05-01 NOTE — Telephone Encounter (Signed)
  Pt's wife calling back to f/u. She said, the pt's pulmonary doctor would like to get an answer today about pt's aspirin

## 2022-05-01 NOTE — Telephone Encounter (Signed)
Pt c/o medication issue:  1. Name of Medication:   aspirin EC 81 MG tablet    2. How are you currently taking this medication (dosage and times per day)? Take 81 mg by mouth daily.  3. Are you having a reaction (difficulty breathing--STAT)? No  4. What is your medication issue? Spouse states that pt was in the hospital due to blood clots and would like to know if he should continue taking above medication. Please advise

## 2022-05-01 NOTE — Telephone Encounter (Signed)
Will forward to Dr Marlou Porch for review if pt needs to continue with ASA 81 along with Eliquis

## 2022-05-01 NOTE — Telephone Encounter (Signed)
Returned call to patient's wife (DPR on file). Patient with recent admission for PE and was started on Eliquis 5 mg BID. Patient is also taking ASA 81 mg QD. 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 Pulmonologist wanted patient to check with cardiologist today on whether or not to continue ASA while on Eliquis. Denies S/Sx of bleeding. Made wife aware that Dr. Marlou Porch will be back in the office tomorrow and we will call with his recommendations at that time. She verbalized understanding and thanked me for the call.

## 2022-05-02 NOTE — Telephone Encounter (Signed)
Let's continue the ASA '81mg'$  with the Eliquis since he has AVR  Thanks  [name], MD   [name] - pt's wife aware of the above information.   1 yr f/u appt scheduled for 07/17/22.

## 2022-05-05 DIAGNOSIS — R972 Elevated prostate specific antigen [PSA]: Secondary | ICD-10-CM | POA: Diagnosis not present

## 2022-05-05 DIAGNOSIS — Z03818 Encounter for observation for suspected exposure to other biological agents ruled out: Secondary | ICD-10-CM | POA: Diagnosis not present

## 2022-05-05 DIAGNOSIS — J069 Acute upper respiratory infection, unspecified: Secondary | ICD-10-CM | POA: Diagnosis not present

## 2022-05-05 DIAGNOSIS — Z125 Encounter for screening for malignant neoplasm of prostate: Secondary | ICD-10-CM | POA: Diagnosis not present

## 2022-05-05 DIAGNOSIS — I2692 Saddle embolus of pulmonary artery without acute cor pulmonale: Secondary | ICD-10-CM | POA: Diagnosis not present

## 2022-06-08 DIAGNOSIS — M170 Bilateral primary osteoarthritis of knee: Secondary | ICD-10-CM | POA: Diagnosis not present

## 2022-06-08 DIAGNOSIS — N1831 Chronic kidney disease, stage 3a: Secondary | ICD-10-CM | POA: Diagnosis not present

## 2022-07-03 DIAGNOSIS — N1831 Chronic kidney disease, stage 3a: Secondary | ICD-10-CM | POA: Diagnosis not present

## 2022-07-06 ENCOUNTER — Inpatient Hospital Stay: Payer: Medicare Other | Admitting: Oncology

## 2022-07-06 ENCOUNTER — Inpatient Hospital Stay: Payer: Medicare Other | Attending: Oncology

## 2022-07-06 VITALS — BP 127/65 | HR 63 | Temp 98.1°F | Resp 18 | Ht 71.0 in | Wt 232.2 lb

## 2022-07-06 DIAGNOSIS — Z951 Presence of aortocoronary bypass graft: Secondary | ICD-10-CM | POA: Insufficient documentation

## 2022-07-06 DIAGNOSIS — Z952 Presence of prosthetic heart valve: Secondary | ICD-10-CM | POA: Diagnosis not present

## 2022-07-06 DIAGNOSIS — C211 Malignant neoplasm of anal canal: Secondary | ICD-10-CM

## 2022-07-06 DIAGNOSIS — Z86711 Personal history of pulmonary embolism: Secondary | ICD-10-CM | POA: Diagnosis not present

## 2022-07-06 DIAGNOSIS — I350 Nonrheumatic aortic (valve) stenosis: Secondary | ICD-10-CM | POA: Diagnosis not present

## 2022-07-06 DIAGNOSIS — Z85048 Personal history of other malignant neoplasm of rectum, rectosigmoid junction, and anus: Secondary | ICD-10-CM | POA: Diagnosis not present

## 2022-07-06 DIAGNOSIS — Z86718 Personal history of other venous thrombosis and embolism: Secondary | ICD-10-CM | POA: Diagnosis not present

## 2022-07-06 DIAGNOSIS — N529 Male erectile dysfunction, unspecified: Secondary | ICD-10-CM | POA: Insufficient documentation

## 2022-07-06 DIAGNOSIS — I824Z1 Acute embolism and thrombosis of unspecified deep veins of right distal lower extremity: Secondary | ICD-10-CM

## 2022-07-06 DIAGNOSIS — Z9221 Personal history of antineoplastic chemotherapy: Secondary | ICD-10-CM | POA: Insufficient documentation

## 2022-07-06 LAB — D-DIMER, QUANTITATIVE: D-Dimer, Quant: 0.4 ug/mL-FEU (ref 0.00–0.50)

## 2022-07-06 NOTE — Progress Notes (Signed)
[name] OFFICE PROGRESS NOTE   Diagnosis: Anal cancer, pulmonary embolism  INTERVAL HISTORY:   Kirk [name] returns as scheduled.  He continues apixaban.  No bleeding or symptom of recurrent thrombosis.  He feels well.  He has returned to working.  He has resumed Ozempic.  Objective:  Vital signs in last 24 hours:  Blood pressure 127/65, pulse 63, temperature 98.1 F (36.7 C), temperature source Oral, resp. rate 18, height '5\' 11"'$  (1.803 m), weight 232 lb 3.2 oz (105.3 kg), SpO2 96 %.    HEENT: Neck without mass Lymphatics: No cervical, supraclavicular, axillary, or inguinal nodes Resp: Lungs clear bilaterally Cardio: Irregular GI: No hepatosplenomegaly, nontender Vascular: No leg edema  Lab Results:  Lab Results  Component Value Date   WBC 7.3 04/29/2022   HGB 15.0 04/29/2022   HCT 47.0 04/29/2022   MCV 100.2 (H) 04/29/2022   PLT 171 04/29/2022   NEUTROABS 5.3 04/26/2022    CMP  Lab Results  Component Value Date   NA 139 04/27/2022   K 4.3 04/27/2022   CL 107 04/27/2022   CO2 27 04/27/2022   GLUCOSE 123 (H) 04/27/2022   BUN 15 04/27/2022   CREATININE 1.18 04/27/2022   CALCIUM 9.0 04/27/2022   PROT 6.4 (L) 04/26/2022   ALBUMIN 3.7 04/26/2022   AST 23 04/26/2022   ALT 24 04/26/2022   ALKPHOS 46 04/26/2022   BILITOT 0.7 04/26/2022   GFRNONAA >60 04/27/2022   GFRAA >60 01/08/2018    Medications: I have reviewed the patient's current medications.   Assessment/Plan: 1.Squamous cell carcinoma of the anal canal, HPV positive.   - Staging CT/PET scan with tiny lung nodules, hypermetabolic retroperitoneal, right inguinal, and left supraclavicular nodes . FNA biopsy of a left supraclavicular lymph node on 05/03/2012 confirmed metastatic squamous cell carcinoma.   -Cycle 1 of 5-fluorouracil/mitomycin-C and concurrent radiation on 05/20/2012. Cycle 2 of 5-fluorouracil/mitomycin-C on 06/24/2012 with completion of radiation on 06/28/2012.    -Restaging PET scan 08/02/2012 with resolution of the hypermetabolic anal primary and right inguinal lymph node. Persistent hypermetabolic supraclavicular and retroperitoneal nodes.   -cycle 1 of salvage Taxol/carboplatin chemotherapy on 08/30/2012 . Restaging CTs after 4 cycles of Taxol/carboplatin confirmed resolution of retroperitoneal lymphadenopathy and a decrease in the size of a previously noted left supraclavicular node. He completed cycle 6 of Taxol/carboplatin on 01/02/2013.   -A negative flexible sigmoidoscopy 04/01/2013   -Staging CT scans 10/09/2013 with stable small left supraclavicular node, no evidence of progressive metastatic disease   -Negative colonoscopy 03/19/2015 -Colonoscopy 05/02/2018-no evidence of recurrent anal cancer, multiple tubular adenomas removed from the colon -Colonoscopy 06/30/2019-no evidence of recurrent anal cancer, multiple tubular adenomas removed 3. Hypermetabolic activity at the right supraglottic larynx on the PET scan 04/17/2012 with associated soft tissue fullness status post a negative ENT evaluation by Dr. Constance Holster.   4. History of aortic stenosis-status post aortic valve replacement and coronary artery bypass surgery in November 2012.   5. Mucositis following cycle 1 chemotherapy. Resolved.   6. Skin breakdown at the perineum and penis secondary to chemotherapy and radiation. Resolved.   7. 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 Bone pain after Neulasta   9. Generalized seizure 09/24/2012-negative brain CT,? Etiology. He has been evaluated by neurology and was prescribed Vimpat.   10.history of Thrombocytopenia secondary chemotherapy  11. Peripheral neuropathy-predating chemotherapy   12. Erectile dysfunction-followed by Dr. Risa Grill 13.  Right leg DVT 03/28/2022  CT chest 04/25/2022-nonocclusive filling defect of the right  common femoral vein, acute saddle pulmonary embolus of the main pulmonary artery, left and right pulmonary arteries,  and bilateral lateral lobar arteries Admission 04/26/2022-heparin anticoagulation, discharged on apixaban    Disposition: Kirk [name] has a history of anal cancer.  He is in clinical remission.  He was diagnosed with a DVT and pulmonary embolism in July/August.  He continues apixaban anticoagulation.  The DVT/PE was most likely provoked, though he potentially was at risk for venous thrombosis from a recent viral infection.  Obesity and male sex remain his risk factors for venous thrombosis.  I recommend indefinite anticoagulation therapy.  He will return for an office visit in 3 months.  We discussed the indication for reduced intensity anticoagulation.  He may also require long-term anticoagulation for atrial fibrillation.  We will confer with Dr. Marlou Porch prior to reducing the apixaban dose.  Kirk [name] will continue full dose apixaban.  He will return for an office visit in 3 months.  Betsy Coder, MD  07/06/2022  12:08 PM

## 2022-07-17 ENCOUNTER — Encounter: Payer: Self-pay | Admitting: Cardiology

## 2022-07-17 ENCOUNTER — Ambulatory Visit: Payer: Medicare Other | Attending: Cardiology | Admitting: Cardiology

## 2022-07-17 VITALS — BP 126/72 | HR 65 | Ht 71.0 in | Wt 227.0 lb

## 2022-07-17 DIAGNOSIS — Z952 Presence of prosthetic heart valve: Secondary | ICD-10-CM

## 2022-07-17 DIAGNOSIS — I2510 Atherosclerotic heart disease of native coronary artery without angina pectoris: Secondary | ICD-10-CM

## 2022-07-17 DIAGNOSIS — I2692 Saddle embolus of pulmonary artery without acute cor pulmonale: Secondary | ICD-10-CM

## 2022-07-17 DIAGNOSIS — I824Y1 Acute embolism and thrombosis of unspecified deep veins of right proximal lower extremity: Secondary | ICD-10-CM

## 2022-07-17 DIAGNOSIS — Z951 Presence of aortocoronary bypass graft: Secondary | ICD-10-CM

## 2022-07-17 MED ORDER — ELIQUIS 5 MG PO TABS: 5.0000 mg | ORAL_TABLET | Freq: Two times a day (BID) | ORAL | 2 refills | Status: DC

## 2022-07-17 NOTE — Progress Notes (Signed)
Cardiology Office Note:    Date:  07/17/2022   ID:  Kirk [name], DOB 06/08/1946, MRN [id number]  PCP:  Kirk Dus, MD  [facility] Cardiologist:  Kirk Furbish, MD  [facility] Electrophysiologist:  None   Referring MD: Kirk Dus, MD    History of Present Illness:    Kirk [name] is a 76 y.o. male, goes by Kirk [name], here for follow-up of prior aortic valve replacement in 2012 secondary to severe aortic stenosis with concomitant single-vessel bypass LIMA to LAD.  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  Had supraclavicular as well as periabdominal aortic lymph nodes.  Saw oncology at [facility] and had chemoradiation.  Cancer is in remission.  He presented to the ED and was admitted to the hospital 04/25/2022. He was diagnosed with RLE DVT in late July/early August while on a beach trip after noticing RLE edema. He was started on Eliquis twice daily. At this visit, CTA chest showed acute saddle PE of the main pulmonary artery, left and right pulmonary arteries, and bilateral lobar arteries with evidence of right heart strain.   Had back surgery in May 2020 by Dr. Vertell Limber. Kirk [name] is his wife.  Previously purchased a [employer] truck.  [place] home at [facility].   At his last appointment, he was doing well. He had scabs along his L arm from cleaning his gutters of leaves. He was doing well losing weight but recently gained 11 lbs. He was working on losing weight again. Otherwise, he had no major complaints.  Today, he is accompanied by his wife. He appears to be well.  His PCP is trying to put him on Ozempic, but it has not been available for them to pick up yet. He has taken it before and lost weight well on it.  He denies any palpitations, chest pain, shortness of breath, or peripheral edema. No lightheadedness, headaches, syncope, orthopnea, or PND.   Past Medical History:  Diagnosis Date   Allergy    Angina    6 wks. ago   Aortic  stenosis    valve replacement   Arthritis    1995- cerv. fusion- [facility]   Atrial fibrillation ([place]) 08/20/2011   Post op heart surgery, no problems since, no meds   Blood in stool    Cancer ([place]) 04/09/12   Rectum bx=invasive squamous cell carcinoma   CHF (congestive heart failure) (HCC)    Coronary artery disease    aortic stenosis, CAD   Diabetes mellitus    Type 2   ED (erectile dysfunction)    ED (erectile dysfunction)    Elevated cholesterol    Heart murmur    no problems   History of kidney stones    passed, lithrotrispy and surgery   History of radiation therapy 05/20/12-06/28/12   anal cancer=54gy total dose   Hypertension    Kidney stones    Malignant neoplasm of anal canal ([place]) 05/12/2012   Malignant neoplasm of other sites of rectum, rectosigmoid junction, and anus 04/12/2012   Myocardial infarction ([place]) 2012   Personal history of colonic polyps 03/20/2012   Rectal bleeding    Rectal mass 03/20/2012   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   [facility]    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   LIMA to diagonal branch    Seizure disorder, grand mal ([place]    only 1   [place]    after  taking Oxycodone; not prescribed seizure med   Shortness of breath    Thrombosed hemorrhoids     Past Surgical History:  Procedure Laterality Date   ANAL FISSURE REPAIR     patient unsure of date   ANTERIOR CERVICAL DECOMP/DISCECTOMY FUSION N/A 01/22/2018   Procedure: ANTERIOR CERVICAL DECOMPRESSION/DISCECTOMY FUSION CERVICAL SIX- CERVICAL SEVEN;  Surgeon: Erline Levine, MD;  Location: [place];  Service: Neurosurgery;  Laterality: N/A;  ANTERIOR CERVICAL DECOMPRESSION/DISCECTOMY FUSION CERVICAL SIX- CERVICAL SEVEN   AORTIC VALVE REPLACEMENT  08/11/2011   Procedure: AORTIC VALVE REPLACEMENT (AVR);  Surgeon: Rexene Alberts, MD;  Location: [place];  Service: Open Heart Surgery;  Laterality: N/A;   C4-6 FUSION  1995   DR STERN   COLONOSCOPY     hx polyps    CORONARY ARTERY BYPASS GRAFT  08/16/2011   Procedure: CORONARY ARTERY BYPASS GRAFTING (CABG);  Surgeon: Rexene Alberts, MD;  Location: [place];  Service: Open Heart Surgery;  Laterality: N/A;  CABG times one using left internal mammary artery   LITHOTRIPSY  2001 AND 2002   DR PETERSON   POLYPECTOMY     colon    Current Medications: Current Meds  Medication Sig   amitriptyline (ELAVIL) 25 MG tablet Take 25 mg by mouth at bedtime as needed for sleep.   aspirin EC 81 MG tablet Take 81 mg by mouth daily.   Cholecalciferol 1000 UNITS tablet Take 1,000 Units by mouth daily.   DULoxetine (CYMBALTA) 60 MG capsule Take 60 mg by mouth at bedtime.   FARXIGA 5 MG TABS tablet Take 5 mg by mouth daily.   losartan (COZAAR) 50 MG tablet Take 1 tablet (50 mg total) by mouth daily.   metoprolol tartrate (LOPRESSOR) 25 MG tablet TAKE 1 TABLET BY MOUTH TWICE A DAY   MYRBETRIQ 25 MG TB24 tablet Take 25 mg by mouth at bedtime.    rosuvastatin (CRESTOR) 40 MG tablet Take 40 mg by mouth at bedtime.   Semaglutide,0.25 or 0.'5MG'$ /DOS, (OZEMPIC, 0.25 OR 0.5 MG/DOSE,) 2 MG/3ML SOPN Inject 0.25 mg into the skin once a week.   Tetrahydrozoline HCl (VISINE OP) Place 1 drop into both eyes as needed (for redness/itching). Visine S   Vitamins-Lipotropics (LIPO-FLAVONOID PLUS) TABS Take 1 tablet by mouth 3 (three) times daily after meals.   Zinc Sulfate (ZINC-220 PO) Take 220 mg by mouth daily.   [DISCONTINUED] ELIQUIS 5 MG TABS tablet Take 5 mg by mouth in the morning and at bedtime.     Allergies:   Cat hair extract, Hydrocodone, Atorvastatin, Dust mite extract, Metformin, Oxycodone, and Semaglutide(0.25 or 0.'5mg'$ -dos)   Social History   Socioeconomic History   Marital status: Married    Spouse name: Not on file   Number of children: Not on file   Years of education: Not on file   Highest education level: Not on file  Occupational History   Not on file  Tobacco Use   Smoking status: Never   Smokeless tobacco:  Never  Vaping Use   Vaping Use: Never used  Substance and Sexual Activity   Alcohol use: Not Currently    Alcohol/week: 0.0 standard drinks of alcohol   Drug use: No   Sexual activity: Not on file  Other Topics Concern   Not on file  Social History Narrative   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 There is no history of Anesthesia problems, Hypotension, Malignant hyperthermia, Pseudochol deficiency, Colon cancer, Colon polyps, Stomach cancer, or Rectal cancer.  ROS:   Please see the history of present illness.    All other systems reviewed and are negative.  EKGs/Labs/Other Studies Reviewed:    Bilateral LE Venous Doppler 04/27/2022: Summary:  RIGHT:  - Findings consistent with acute deep vein thrombosis involving the right  femoral vein, right popliteal vein, right peroneal veins, and right  gastrocnemius veins.  - No cystic structure found in the popliteal fossa.    LEFT:  - There is no evidence of deep vein thrombosis in the lower extremity.    - No cystic structure found in the popliteal fossa.    Echo 04/27/2022:  1. Left ventricular ejection fraction, by estimation, is 65 to 70%. The  left ventricle has normal function. The left ventricle has no regional  wall motion abnormalities. There is mild concentric left ventricular  hypertrophy. Left ventricular diastolic  parameters are consistent with Grade I diastolic dysfunction (impaired  relaxation).   2. Right ventricular systolic function is normal. The right ventricular  size is normal. Tricuspid regurgitation signal is inadequate  for assessing  PA pressure.   3. Left atrial size was moderately dilated.   4. The mitral valve is abnormal. No evidence of mitral valve  regurgitation. Moderate mitral annular calcification.   5. The aortic valve has been repaired/replaced. Aortic valve  regurgitation is not visualized. There is a 25 mm Magna Ease Pericardial  Valve valve present in the aortic position. Aortic valve mean gradient  measures 16.0 mmHg. Prosthetic leaflets are not  well visualized, however, mean gradient appears stable to slightly  improved from prior study in 07/2020 (mean gradient 45mHg at that time).   6. Aortic dilatation noted. There is borderline dilatation of the  ascending aorta, measuring 38 mm.   7. The inferior vena cava is normal in size with greater than 50%  respiratory variability, suggesting right atrial pressure of 3 mmHg.   Comparison(s): Compared to prior study on 07/2020, there is no significant change. Prior mean AoV gradient 274mg.    CTA Chest PE 04/25/2022: IMPRESSION: CHEST CTA:   1. Acute saddle pulmonary embolus of the main pulmonary artery, left and right pulmonary arteries, and bilateral lobar arteries. 2. Calcifications are seen in the bilateral distal right and left pulmonary arteries, likely post thrombotic change related to prior pulmonary embolus. 3. Elevated RV to LV ratio, findings can be seen in the setting of right heart strain. Correlate with echocardiography. 4. Branching, tubular high attenuation structure of the left lower lobe, compatible with a bronchocele, finding was present on 2014 exam, but has increased in size.   ABDOMEN AND PELVIS CT:   1. Nonocclusive filling defect of the right common femoral vein, compatible with history of DVT. 2. No evidence of metastatic disease in the abdomen or pelvis. 3. Aortic Atherosclerosis (ICD10-I70.0).   Echo 07/15/20  1. Left ventricular ejection fraction, by estimation, is 60 to 65%. The  left ventricle has  normal function. The left ventricle has no regional  wall motion abnormalities. Left ventricular diastolic parameters are  consistent with Grade I diastolic  dysfunction (impaired relaxation).   2. Right ventricular  systolic function is normal. The right ventricular  size is normal. Tricuspid regurgitation signal is inadequate for assessing  PA pressure.   3. The mitral valve is degenerative. Trivial mitral valve regurgitation.  No evidence of mitral stenosis. Moderate mitral annular calcification.   4. The aortic valve has been repaired/replaced. Aortic valve  regurgitation is not visualized. There is a 25 mm [facility] Ease  pericardial valve present in the aortic position. Procedure Date:  08/16/2011. Aortic valve mean gradient measures 20.0  mmHg. Prosthetic aortic valve leaflets not well visualized. See  calculations in Findings. Findings suggest grossly normal prosthetic valve  function, with mildly increased systolic gradient that is not  significantly changed since prior exam in 2017.   EKG:  EKG was personally reviewed 07/17/22: Sinus rhythm. Rate 65 bpm. 07/18/21: Sinus rhythm, rate 71 bpm with lateral T-wave inversion and  no significant changes 06/23/20: NSR 63 06/18/19: normal sinus rhythm.  Recent Labs: 04/26/2022: ALT 24 04/27/2022: B Natriuretic Peptide 130.9; BUN 15; Creatinine, Ser 1.18; Potassium 4.3; Sodium 139 04/29/2022: Hemoglobin 15.0; Platelets 171  Recent Lipid Panel No results found for: "CHOL", "TRIG", "HDL", "CHOLHDL", "VLDL", "LDLCALC", "LDLDIRECT"   Risk Assessment/Calculations:       Physical Exam:    VS:  BP 126/72   Pulse 65   Ht '5\' 11"'$  (1.803 m)   Wt 227 lb (103 kg)   SpO2 97%   BMI 31.66 kg/m     Wt Readings from Last 3 Encounters:  07/17/22 227 lb (103 kg)  07/06/22 232 lb 3.2 oz (105.3 kg)  04/26/22 238 lb 1.6 oz (108 kg)     GEN:  Well nourished, well developed in no acute distress HEENT: Normal NECK: No JVD; No carotid  bruits LYMPHATICS: No lymphadenopathy CARDIAC: RRR, 1/6 soft SM, no rubs, gallops, scar noted RESPIRATORY:  Clear to auscultation without rales, wheezing or rhonchi  ABDOMEN: Soft, non-tender, non-distended MUSCULOSKELETAL:  No edema; No deformity  SKIN: Warm and dry NEUROLOGIC:  Alert and oriented x 3 PSYCHIATRIC:  Normal affect   ASSESSMENT:    1. Coronary artery disease involving native coronary artery of native heart without angina pectoris   2. S/P CABG x 1   3. S/P AVR (aortic valve replacement)   4. Acute deep vein thrombosis (DVT) of proximal vein of right lower extremity (HCC)   5. Acute saddle pulmonary embolism without acute cor pulmonale (HCC)     PLAN:     DVT/PE - Dr. Penni Homans has seen him.  No recurrence of cancer.  Right DVT/saddle PE.  Lets continue Eliquis 5 mg twice a day given the severity of his PE for at least 6 months if not an entire year.  After that I would not be opposed to maintenance therapy with a 2.5 mg dose.  Coronary artery disease involving native coronary artery of native heart without angina pectoris Had single-vessel CABG LIMA to diagonal at the time of AVR. Diagonal branch with ostial stenosis in the past.  This was back in December 2012.  Doing well without any anginal symptoms.  Continue with goal-directed medical therapy which include aspirin 81 mg and rosuvastatin 40 mg metoprolol 25 mg.  Continue with current medical management.  LDL 87 creatinine 1.3   S/P CABG x 1 As above LIMA, stable without any anginal symptoms.   S/P AVR (aortic valve replacement) 25 mm magna ease Edwards pericardial tissue valve.  Aspirin.  We will keep this going with his Eliquis.  Watch for any signs  of bleeding.  Dental prophylaxis.  Doing well.  Echocardiogram EF 65% mean gradient was 20 mmHg.   Elevated cholesterol Crestor 40 mg a day no myalgias.     Follow up: 1 year.  Medication Adjustments/Labs and Tests Ordered: Current medicines are reviewed at  length with the patient today.  Concerns regarding medicines are outlined above.  Orders Placed This Encounter  Procedures   EKG 12-Lead    Meds ordered this encounter  Medications   ELIQUIS 5 MG TABS tablet    Sig: Take 1 tablet (5 mg total) by mouth in the morning and at bedtime.    Dispense:  180 tablet    Refill:  2    Patient Instructions  Medication Instructions:  The current medical regimen is effective;  continue present plan and medications.  *If you need a refill on your cardiac medications before your next appointment, please call your pharmacy*  Follow-Up: At [facility], you and your health needs are our priority.  As part of our continuing mission to provide you with exceptional heart care, we have created designated Provider Care Teams.  These Care Teams include your primary Cardiologist (physician) and Advanced Practice Providers (APPs -  Physician Assistants and Nurse Practitioners) who all work together to provide you with the care you need, when you need it.  We recommend signing up for the patient portal called "MyChart".  Sign up information is provided on this After Visit Summary.  MyChart is used to connect with patients for Virtual Visits (Telemedicine).  Patients are able to view lab/test results, encounter notes, upcoming appointments, etc.  Non-urgent messages can be sent to your provider as well.   To learn more about what you can do with MyChart, go to [email or web address].    Your next appointment:   1 year(s)  The format for your next appointment:   In Person  Provider:   [name], MD     Important Information About Sugar          I,Breanna Adamick,acting as a scribe for Kirk Furbish, MD.,have documented all relevant documentation on the behalf of Kirk Furbish, MD,as directed by  Kirk Furbish, MD while in the presence of Kirk Furbish, MD.   I, Kirk Furbish, MD, have reviewed all documentation for this visit. The documentation on  07/17/22 for the exam, diagnosis, procedures, and orders are all accurate and complete.   Signed, Kirk Furbish, MD  07/17/2022 10:02 AM    [place]

## 2022-07-17 NOTE — Patient Instructions (Signed)
Medication Instructions:  The current medical regimen is effective;  continue present plan and medications.  *If you need a refill on your cardiac medications before your next appointment, please call your pharmacy*  Follow-Up: At [facility], you and your health needs are our priority.  As part of our continuing mission to provide you with exceptional heart care, we have created designated Provider Care Teams.  These Care Teams include your primary Cardiologist (physician) and Advanced Practice Providers (APPs -  Physician Assistants and Nurse Practitioners) who all work together to provide you with the care you need, when you need it.  We recommend signing up for the patient portal called "MyChart".  Sign up information is provided on this After Visit Summary.  MyChart is used to connect with patients for Virtual Visits (Telemedicine).  Patients are able to view lab/test results, encounter notes, upcoming appointments, etc.  Non-urgent messages can be sent to your provider as well.   To learn more about what you can do with MyChart, go to [email or web address].    Your next appointment:   1 year(s)  The format for your next appointment:   In Person  Provider:   Mark Skains, MD      Important Information About Sugar       

## 2022-08-11 ENCOUNTER — Ambulatory Visit: Payer: Medicare Other | Admitting: Oncology

## 2022-08-19 DIAGNOSIS — M170 Bilateral primary osteoarthritis of knee: Secondary | ICD-10-CM | POA: Diagnosis not present

## 2022-09-19 ENCOUNTER — Encounter: Payer: Self-pay | Admitting: Gastroenterology

## 2022-09-25 DIAGNOSIS — K080 Exfoliation of teeth due to systemic causes: Secondary | ICD-10-CM | POA: Diagnosis not present

## 2022-10-06 ENCOUNTER — Inpatient Hospital Stay: Payer: Medicare Other | Attending: Oncology | Admitting: Oncology

## 2022-10-06 VITALS — BP 142/77 | HR 68 | Temp 98.1°F | Resp 18 | Ht 71.0 in | Wt 231.0 lb

## 2022-10-06 DIAGNOSIS — N529 Male erectile dysfunction, unspecified: Secondary | ICD-10-CM | POA: Insufficient documentation

## 2022-10-06 DIAGNOSIS — Z86718 Personal history of other venous thrombosis and embolism: Secondary | ICD-10-CM | POA: Diagnosis not present

## 2022-10-06 DIAGNOSIS — G629 Polyneuropathy, unspecified: Secondary | ICD-10-CM | POA: Diagnosis not present

## 2022-10-06 DIAGNOSIS — Z85048 Personal history of other malignant neoplasm of rectum, rectosigmoid junction, and anus: Secondary | ICD-10-CM | POA: Insufficient documentation

## 2022-10-06 DIAGNOSIS — Z86711 Personal history of pulmonary embolism: Secondary | ICD-10-CM | POA: Diagnosis not present

## 2022-10-06 DIAGNOSIS — C211 Malignant neoplasm of anal canal: Secondary | ICD-10-CM | POA: Diagnosis not present

## 2022-10-06 NOTE — Progress Notes (Signed)
[place] OFFICE PROGRESS NOTE   Diagnosis: Anal cancer, pulmonary embolism  INTERVAL HISTORY:   Mr. Vise returns as scheduled.  He continues apixaban anticoagulation.  He bruises easily.  No other bleeding.  Feels well.    Objective:  Vital signs in last 24 hours:  Blood pressure (!) 142/77, pulse 68, temperature 98.1 F (36.7 C), temperature source Oral, resp. rate 18, height '5\' 11"'$  (1.803 m), weight 231 lb (104.8 kg), SpO2 98 %.    Lymphatics: No cervical, supraclavicular, axillary, or inguinal nodes Resp: Lungs clear bilaterally Cardio: Regular rate and rhythm GI: No hepatosplenomegaly, nontender Vascular: No leg edema  Skin: Small ecchymoses over the forearms  Lab Results:  Lab Results  Component Value Date   WBC 7.3 04/29/2022   HGB 15.0 04/29/2022   HCT 47.0 04/29/2022   MCV 100.2 (H) 04/29/2022   PLT 171 04/29/2022   NEUTROABS 5.3 04/26/2022    CMP  Lab Results  Component Value Date   NA 139 04/27/2022   K 4.3 04/27/2022   CL 107 04/27/2022   CO2 27 04/27/2022   GLUCOSE 123 (H) 04/27/2022   BUN 15 04/27/2022   CREATININE 1.18 04/27/2022   CALCIUM 9.0 04/27/2022   PROT 6.4 (L) 04/26/2022   ALBUMIN 3.7 04/26/2022   AST 23 04/26/2022   ALT 24 04/26/2022   ALKPHOS 46 04/26/2022   BILITOT 0.7 04/26/2022   GFRNONAA >60 04/27/2022   GFRAA >60 01/08/2018     Medications: I have reviewed the patient's current medications.   Assessment/Plan:  Squamous cell carcinoma of the anal canal, HPV positive.   - Staging CT/PET scan with tiny lung nodules, hypermetabolic retroperitoneal, right inguinal, and left supraclavicular nodes . FNA biopsy of a left supraclavicular lymph node on 05/03/2012 confirmed metastatic squamous cell carcinoma.   -Cycle 1 of 5-fluorouracil/mitomycin-C and concurrent radiation on 05/20/2012. Cycle 2 of 5-fluorouracil/mitomycin-C on 06/24/2012 with completion of radiation on 06/28/2012.   -Restaging PET scan  08/02/2012 with resolution of the hypermetabolic anal primary and right inguinal lymph node. Persistent hypermetabolic supraclavicular and retroperitoneal nodes.   -cycle 1 of salvage Taxol/carboplatin chemotherapy on 08/30/2012 . Restaging CTs after 4 cycles of Taxol/carboplatin confirmed resolution of retroperitoneal lymphadenopathy and a decrease in the size of a previously noted left supraclavicular node. He completed cycle 6 of Taxol/carboplatin on 01/02/2013.   -A negative flexible sigmoidoscopy 04/01/2013   -Staging CT scans 10/09/2013 with stable small left supraclavicular node, no evidence of progressive metastatic disease   -Negative colonoscopy 03/19/2015 -Colonoscopy 05/02/2018-no evidence of recurrent anal cancer, multiple tubular adenomas removed from the colon -Colonoscopy 06/30/2019-no evidence of recurrent anal cancer, multiple tubular adenomas removed 3. Hypermetabolic activity at the right supraglottic larynx on the PET scan 04/17/2012 with associated soft tissue fullness status post a negative ENT evaluation by Dr. Constance Holster.   4. History of aortic stenosis-status post aortic valve replacement and coronary artery bypass surgery in November 2012.   5. Mucositis following cycle 1 chemotherapy. Resolved.   6. Skin breakdown at the perineum and penis secondary to chemotherapy and radiation. Resolved.   7. 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 Bone pain after Neulasta   9. Generalized seizure 09/24/2012-negative brain CT,? Etiology. He has been evaluated by neurology and was prescribed Vimpat.   10.history of Thrombocytopenia secondary chemotherapy  11. Peripheral neuropathy-predating chemotherapy   12. Erectile dysfunction-followed by Dr. Risa Grill 13.  Right leg DVT 03/28/2022  CT chest 04/25/2022-nonocclusive filling defect of the right common femoral  vein, acute saddle pulmonary embolus of the main pulmonary artery, left and right pulmonary arteries, and bilateral lateral  lobar arteries Admission 04/26/2022-heparin anticoagulation, discharged on apixaban     Disposition: Mr Fick is in clinical remission of anal cancer.  He will return for an office visit in 6 months. He has been maintained on apixaban anticoagulation since being diagnosed with a right lower extremity DVT and pulmonary embolism last summer.  I recommend indefinite anticoagulation therapy.  The DVT/PE was most likely unprovoked, though potentially related to a predisposing viral infection.  He appears to be a candidate for reduced intensity anticoagulation.  The plan is to change to reduced intensity apixaban.  He reports a remote history of atrial fibrillation.  We will be sure Dr. Marlou Porch is okay with reducing the apixaban dose.  Betsy Coder, MD  10/06/2022  11:38 AM

## 2022-10-09 ENCOUNTER — Encounter: Payer: Self-pay | Admitting: Gastroenterology

## 2022-10-12 ENCOUNTER — Other Ambulatory Visit: Payer: Self-pay

## 2022-10-12 ENCOUNTER — Telehealth: Payer: Self-pay

## 2022-10-12 DIAGNOSIS — I824Z1 Acute embolism and thrombosis of unspecified deep veins of right distal lower extremity: Secondary | ICD-10-CM

## 2022-10-12 MED ORDER — APIXABAN 2.5 MG PO TABS: 2.5000 mg | ORAL_TABLET | Freq: Two times a day (BID) | ORAL | 0 refills | Status: DC

## 2022-10-12 NOTE — Telephone Encounter (Signed)
Dr Marlou Porch agree to reduce the apixaban dose to 2.5 mg B.I.D. A new prescription was sent to [facility]. The patient is aware of the prescription.

## 2022-11-20 DIAGNOSIS — Z0100 Encounter for examination of eyes and vision without abnormal findings: Secondary | ICD-10-CM | POA: Diagnosis not present

## 2022-11-20 LAB — HM DIABETES EYE EXAM

## 2022-11-23 ENCOUNTER — Encounter (HOSPITAL_BASED_OUTPATIENT_CLINIC_OR_DEPARTMENT_OTHER): Payer: Self-pay | Admitting: Family Medicine

## 2022-11-23 ENCOUNTER — Ambulatory Visit (INDEPENDENT_AMBULATORY_CARE_PROVIDER_SITE_OTHER): Payer: Medicare Other | Admitting: Family Medicine

## 2022-11-23 VITALS — BP 130/77 | HR 72 | Ht 71.0 in | Wt 231.0 lb

## 2022-11-23 DIAGNOSIS — E119 Type 2 diabetes mellitus without complications: Secondary | ICD-10-CM | POA: Diagnosis not present

## 2022-11-23 DIAGNOSIS — N529 Male erectile dysfunction, unspecified: Secondary | ICD-10-CM | POA: Diagnosis not present

## 2022-11-23 DIAGNOSIS — M25561 Pain in right knee: Secondary | ICD-10-CM

## 2022-11-23 DIAGNOSIS — I10 Essential (primary) hypertension: Secondary | ICD-10-CM

## 2022-11-23 DIAGNOSIS — M25562 Pain in left knee: Secondary | ICD-10-CM

## 2022-11-23 DIAGNOSIS — G8929 Other chronic pain: Secondary | ICD-10-CM

## 2022-11-23 DIAGNOSIS — M170 Bilateral primary osteoarthritis of knee: Secondary | ICD-10-CM

## 2022-11-23 MED ORDER — TADALAFIL 10 MG PO TABS: 10.0000 mg | ORAL_TABLET | Freq: Every day | ORAL | 1 refills | Status: DC | PRN

## 2022-11-23 NOTE — Progress Notes (Unsigned)
New Patient Office Visit  Subjective    Patient ID: Kirk [name], male    DOB: 1945-10-10  Age: 77 y.o. MRN: [id number]  CC:  Chief Complaint  Patient presents with   New Patient (Initial Visit)    Pt here to establish new care     HPI Kirk [name] presents to establish care Last PCP - PCP retired  DM:   PE:   HTN:   Cancer:   Questions about ED.   Patient is originally from [place]. He works as Curator. He enjoys fishing, hunting, going to baseball games, spending time with family.  Outpatient Encounter Medications as of 11/23/2022  Medication Sig   amitriptyline (ELAVIL) 25 MG tablet Take 25 mg by mouth at bedtime as needed for sleep.   apixaban (ELIQUIS) 2.5 MG TABS tablet Take 1 tablet (2.5 mg total) by mouth 2 (two) times daily.   aspirin EC 81 MG tablet Take 81 mg by mouth daily.   Cholecalciferol 1000 UNITS tablet Take 1,000 Units by mouth daily.   DULoxetine (CYMBALTA) 60 MG capsule Take 60 mg by mouth at bedtime.   FARXIGA 5 MG TABS tablet Take 5 mg by mouth daily.   losartan (COZAAR) 50 MG tablet Take 1 tablet (50 mg total) by mouth daily.   metoprolol tartrate (LOPRESSOR) 25 MG tablet TAKE 1 TABLET BY MOUTH TWICE A DAY   MYRBETRIQ 25 MG TB24 tablet Take 25 mg by mouth at bedtime.    rosuvastatin (CRESTOR) 40 MG tablet Take 40 mg by mouth at bedtime.   Semaglutide,0.25 or 0.5MG /DOS, (OZEMPIC, 0.25 OR 0.5 MG/DOSE,) 2 MG/3ML SOPN Inject 0.25 mg into the skin once a week.   tadalafil (CIALIS) 10 MG tablet Take 1 tablet (10 mg total) by mouth daily as needed for erectile dysfunction.   Tetrahydrozoline HCl (VISINE OP) Place 1 drop into both eyes as needed (for redness/itching). Visine S   Vitamins-Lipotropics (LIPO-FLAVONOID PLUS) TABS Take 1 tablet by mouth 3 (three) times daily after meals.   Zinc Sulfate (ZINC-220 PO) Take 220 mg by mouth daily.   [DISCONTINUED] ELIQUIS 5 MG TABS tablet Take 1 tablet (5 mg total) by mouth in the morning and at bedtime.    No facility-administered encounter medications on file as of 11/23/2022.    Past Medical History:  Diagnosis Date   Allergy    Anal cancer ([place]) 04/2012   Angina    6 wks. ago   Aortic stenosis    valve replacement   Arthritis    1995- cerv. 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   CHF (congestive heart failure) ([place])    Coronary artery disease    aortic stenosis, CAD   Diabetes mellitus    Type 2   DVT (deep venous thrombosis) (HCC)    ED (erectile dysfunction)    ED (erectile dysfunction)    Elevated cholesterol    Heart murmur    no problems   History of kidney stones    passed, lithrotrispy and surgery   History of radiation therapy 05/20/12-06/28/12   anal cancer=54gy total dose   Hypertension    Kidney stones    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/2012   Malignant neoplasm of other sites of rectum, rectosigmoid junction, and anus 04/12/2012   Myocardial infarction [facility]) 2012  Personal history of colonic polyps 03/20/2012   Pulmonary embolism (HCC)    Rectal bleeding    Rectal mass 03/20/2012   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   #22mm [facility] Ease pericardial tissue valve    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   LIMA to diagonal branch    Seizure disorder, grand mal ([place])    only 1   Seizures ([place])    after taking Oxycodone; not prescribed seizure med   Shortness of breath    Thrombosed hemorrhoids     Past Surgical History:  Procedure Laterality Date   ANAL FISSURE REPAIR     patient unsure of date   ANTERIOR CERVICAL DECOMP/DISCECTOMY FUSION N/A 01/22/2018   Procedure: ANTERIOR CERVICAL DECOMPRESSION/DISCECTOMY FUSION CERVICAL SIX- CERVICAL SEVEN;  Surgeon: Erline Levine, MD;  Location: [place];  Service: Neurosurgery;  Laterality: N/A;  ANTERIOR CERVICAL DECOMPRESSION/DISCECTOMY  FUSION CERVICAL SIX- CERVICAL SEVEN   AORTIC VALVE REPLACEMENT  08/11/2011   Procedure: AORTIC VALVE REPLACEMENT (AVR);  Surgeon: Rexene Alberts, MD;  Location: [place];  Service: Open Heart Surgery;  Laterality: N/A;   C4-6 FUSION  1995   DR STERN   COLONOSCOPY     hx polyps   CORONARY ARTERY BYPASS GRAFT  08/16/2011   Procedure: CORONARY ARTERY BYPASS GRAFTING (CABG);  Surgeon: Rexene Alberts, MD;  Location: [place];  Service: Open Heart Surgery;  Laterality: N/A;  CABG times one using left internal mammary artery   LITHOTRIPSY  2001 AND 2002   DR PETERSON   POLYPECTOMY     colon    Family History  Problem Relation Age of Onset   Cancer Mother        breast   Heart attack Father    Esophageal cancer Maternal Grandfather    Anesthesia problems Neg Hx    Hypotension Neg Hx    Malignant hyperthermia Neg Hx    Pseudochol deficiency Neg Hx    Colon cancer Neg Hx    Colon polyps Neg Hx    Stomach cancer Neg Hx    Rectal cancer Neg Hx     Social History   Socioeconomic History   Marital status: Married    Spouse name: Not on file   Number of children: Not on file   Years of education: Not on file   Highest education level: Not on file  Occupational History   Not on file  Tobacco Use   Smoking status: Never   Smokeless tobacco: Never  Vaping Use   Vaping Use: Never used  Substance and Sexual Activity   Alcohol use: Not Currently    Alcohol/week: 0.0 standard drinks of alcohol   Drug use: No   Sexual activity: Not on file  Other Topics Concern   Not on file  Social History Narrative   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, Administrator, sports   Social Determinants of Radio broadcast assistant Strain: Not on file  Food Insecurity: Not on file  Transportation Needs: Not on file  Physical Activity: Not on file  Stress: Not on file  Social Connections: Not on file  Intimate Partner Violence: Not on file    Objective    BP 130/77 (BP Location: Left Arm, Patient Position:  Sitting, Cuff Size: Large)   Pulse 72   Ht 5\' 11"  (1.803 m)   Wt 231 lb (104.8 kg)   SpO2 97%   BMI 32.22 kg/m   Physical Exam  77 year old male in no acute distress Cardiovascular  exam with regular rate and rhythm, systolic murmur appreciated Lungs clear to auscultation bilaterally  Assessment & Plan:   Problem List Items Addressed This Visit       Cardiovascular and Mediastinum   Essential hypertension - Primary   Relevant Medications   tadalafil (CIALIS) 10 MG tablet     Endocrine   Type 2 diabetes mellitus (HCC)   Relevant Orders   Hemoglobin A1c   Urine Microalbumin w/creat. ratio     Other   ED (erectile dysfunction)   Return in about 6 months (around 05/26/2023) for DM, HTN, ED.   Juliano Mceachin J [place], MD

## 2022-11-26 LAB — MICROALBUMIN / CREATININE URINE RATIO
Creatinine, Urine: 74.1 mg/dL
Microalb/Creat Ratio: <4 mg/g creat (ref 0–29)
Microalbumin, Urine: <3 ug/mL

## 2022-11-26 LAB — HEMOGLOBIN A1C
Est. average glucose Bld gHb Est-mCnc: 146 mg/dL
Hgb A1c MFr Bld: 6.7 % — ABNORMAL HIGH (ref 4.8–5.6)

## 2022-11-28 ENCOUNTER — Ambulatory Visit (INDEPENDENT_AMBULATORY_CARE_PROVIDER_SITE_OTHER): Payer: Medicare Other

## 2022-11-28 DIAGNOSIS — G8929 Other chronic pain: Secondary | ICD-10-CM | POA: Diagnosis not present

## 2022-11-28 DIAGNOSIS — M25561 Pain in right knee: Secondary | ICD-10-CM

## 2022-11-28 DIAGNOSIS — M11261 Other chondrocalcinosis, right knee: Secondary | ICD-10-CM | POA: Diagnosis not present

## 2022-11-28 DIAGNOSIS — M25562 Pain in left knee: Secondary | ICD-10-CM | POA: Diagnosis not present

## 2022-11-28 DIAGNOSIS — M1712 Unilateral primary osteoarthritis, left knee: Secondary | ICD-10-CM | POA: Diagnosis not present

## 2022-11-28 DIAGNOSIS — M1711 Unilateral primary osteoarthritis, right knee: Secondary | ICD-10-CM | POA: Diagnosis not present

## 2022-11-28 DIAGNOSIS — M11262 Other chondrocalcinosis, left knee: Secondary | ICD-10-CM | POA: Diagnosis not present

## 2022-11-29 ENCOUNTER — Ambulatory Visit (INDEPENDENT_AMBULATORY_CARE_PROVIDER_SITE_OTHER): Payer: Medicare Other | Admitting: Family Medicine

## 2022-11-29 DIAGNOSIS — M179 Osteoarthritis of knee, unspecified: Secondary | ICD-10-CM | POA: Insufficient documentation

## 2022-11-29 DIAGNOSIS — M170 Bilateral primary osteoarthritis of knee: Secondary | ICD-10-CM | POA: Diagnosis not present

## 2022-11-29 NOTE — Progress Notes (Signed)
    Procedures performed today:    Procedure: Injection of bilateral knees completed in office today related to underlying osteoarthritis Verbal informed consent obtained.  Time-out conducted.  Noted no overlying erythema, induration, or other signs of local infection.  Skin prepped in a sterile fashion.  Local anesthesia: Topical Ethyl chloride.  With sterile technique: 2 cc Kenalog 40, 3 cc lidocaine injected easily.  Procedure was repeated for contralateral knee. Completed without difficulty  Advised to call if fevers/chills, erythema, induration, drainage, or persistent bleeding.  Impression: Technically successful corticosteroid injection of bilateral knees  Independent interpretation of notes and tests performed by another provider:   None.  Brief History, Exam, Impression, and Recommendations:    Osteoarthritis of knee Patient with history of bilateral knee osteoarthritis.  He has received prior knee injections with corticosteroid.  Last injection was more than 3 months ago.  He has had relief with these injections, however does report that relief has been more short-lived with subsequent injections. Imaging completed recently, reviewed in office today, notable changes of arthritis, otherwise no significant bony abnormality observed. We discussed consideration for steroid injection to help with current symptoms, patient is interested in proceeding with this today.  We reviewed potential risk and side effects, patient voiced understanding. Procedure completed as above, patient tolerated without issue, precautions discussed. Will plan for follow-up in about 2 to 3 months to review progress, patient may be interested in proceeding with hyaluronic acid injection.  If so we will look to arrange for this at time of next office visit   ___________________________________________ Kirk Uzelac [place], MD, ABFM, CAQSM [facility] [place]

## 2022-11-29 NOTE — Assessment & Plan Note (Addendum)
Patient with history of bilateral knee osteoarthritis.  He has received prior knee injections with corticosteroid.  Last injection was more than 3 months ago.  He has had relief with these injections, however does report that relief has been more short-lived with subsequent injections. Imaging completed recently, reviewed in office today, notable changes of arthritis, otherwise no significant bony abnormality observed. We discussed consideration for steroid injection to help with current symptoms, patient is interested in proceeding with this today.  We reviewed potential risk and side effects, patient voiced understanding. Procedure completed as above, patient tolerated without issue, precautions discussed. Will plan for follow-up in about 2 to 3 months to review progress, patient may be interested in proceeding with hyaluronic acid injection.  If so we will look to arrange for this at time of next office visit

## 2022-12-07 ENCOUNTER — Telehealth (HOSPITAL_BASED_OUTPATIENT_CLINIC_OR_DEPARTMENT_OTHER): Payer: Self-pay | Admitting: Family Medicine

## 2022-12-07 NOTE — Telephone Encounter (Signed)
Contacted Gerda Diss Grubb to schedule their annual wellness visit. Call back at later date: 12/11/2022  Patient's going out of town  Thank you,  [place] Direct dial  [phone]

## 2022-12-13 ENCOUNTER — Encounter: Payer: Self-pay | Admitting: Gastroenterology

## 2022-12-13 ENCOUNTER — Ambulatory Visit: Payer: Medicare Other | Admitting: Podiatry

## 2022-12-13 ENCOUNTER — Telehealth: Payer: Self-pay

## 2022-12-13 ENCOUNTER — Ambulatory Visit: Payer: Medicare Other | Admitting: Gastroenterology

## 2022-12-13 VITALS — BP 130/70 | HR 74 | Ht 70.0 in | Wt 232.6 lb

## 2022-12-13 DIAGNOSIS — Z86010 Personal history of colonic polyps: Secondary | ICD-10-CM

## 2022-12-13 DIAGNOSIS — Z7901 Long term (current) use of anticoagulants: Secondary | ICD-10-CM

## 2022-12-13 DIAGNOSIS — Z85048 Personal history of other malignant neoplasm of rectum, rectosigmoid junction, and anus: Secondary | ICD-10-CM | POA: Diagnosis not present

## 2022-12-13 MED ORDER — PLENVU 140 G PO SOLR: 1.0000 | Freq: Once | ORAL | 0 refills | Status: AC

## 2022-12-13 NOTE — Telephone Encounter (Signed)
   Kirk [name] 1946/09/01 [id number]  Dear Dr. Truett Perna:  We have scheduled the above named patient for a(n) colonoscopy procedure. Our records show that he is on anticoagulation therapy.  Please advise as to whether the patient may come off their therapy of ELIQUIS 2 days prior to their procedure which is scheduled for 01-01-23.  Please route your response to [facility], CMA or fax response to [phone]. Thank you.  Sincerely,    [facility]

## 2022-12-13 NOTE — Progress Notes (Signed)
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  I last saw him in October 2020 for his last colonoscopy.  He had 7 polyps removed at that time, most were adenomas.  His colonoscopy for that was in 2019 at which time he had 10 adenomas removed.  He denies any problems with his bowels.  No blood in his stools.  No abdominal pains.  His malignancy has been in remission.  Over the past year he had an acute DVT in his right leg which progressed to a PE.  He is currently on Eliquis and has maintained that pretty well.  He has not had any problems with bleeding symptoms or anemia etc.  He denies any cardiopulmonary symptoms otherwise feels well.    Colonoscopy 06/30/2019: - The perianal and digital rectal examinations were normal. - Two sessile polyps were found in the ascending colon. The polyps were 2 to 4 mm in size. These polyps were removed with a cold snare. Resection and retrieval were complete. - Two sessile polyps were found in the hepatic flexure. The polyps were 3 to 4 mm in size. These polyps were removed with a cold snare. Resection and retrieval were complete. - Two sessile polyps were found in the transverse colon. The polyps were 3 to 4 mm in size. These polyps were removed with a cold snare. Resection and retrieval were complete. - A 3 mm polyp was found in the splenic flexure. The polyp was sessile. The polyp was removed with a cold snare. Resection and retrieval were complete. - Multiple small-mouthed diverticula were found in the sigmoid colon. - The colon was significantly looped and cecal intubation was challenging. The rectal vault was small and atrophic, no retroflexed views obtained. There was  some slightly irritated / friable distal rectal / perianal tissue noted, suspect from prep / radiation. The exam was otherwise without abnormality.  Surgical [P], colon, ascending, hepatic flexure, transverse, splenic flexure, polyp (8) - TUBULAR ADENOMA(S) WITHOUT HIGH-GRADE DYSPLASIA OR MALIGNANCY - INFLAMMATORY POLYP   Colonoscopy 05/02/2018: - The perianal and digital rectal examinations were normal. - A 3 mm polyp was found in the cecum. The polyp was sessile. The polyp was removed with a cold snare. Resection and retrieval were complete. - A 4 mm polyp was found in the ascending colon. The polyp was sessile. The polyp was removed with a cold snare. Resection and retrieval were complete. - Seven sessile polyps were found in the transverse colon. The polyps were 3 to 5 mm in size. These polyps were removed with a cold snare. Resection and retrieval were complete. - A 4 mm polyp was found in the descending colon. The polyp was sessile. The polyp was removed with a cold snare. Resection and retrieval were complete. - The colon was redundant. - The rectal vault was small and atophic appearing, likely due to prior radiation. No evidence of recurrent malignancy. The exam was otherwise without abnormality.  Surgical [P], descending, transverse, ascending and cecum, polyp (10) - MULTIPLE FRAGMENTS OF TUBULAR ADENOMA. - NO HIGH GRADE DYSPLASIA OR MALIGNANCY IDENTIFIED.   Echo 04/27/22: EF 65-70%, grade I DD,      Past Medical History:  Diagnosis Date   Allergy    Anal  cancer 04/2012   Angina    6 wks. ago   Aortic stenosis    valve replacement   Arthritis    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   CHF (congestive heart failure)    Coronary artery disease    aortic stenosis, CAD   Diabetes mellitus    Type 2   DVT (deep venous  thrombosis)    ED (erectile dysfunction)    ED (erectile dysfunction)    Elevated cholesterol    Heart murmur    no problems   History of kidney stones    passed, lithrotrispy and surgery   History of radiation therapy 05/20/12-06/28/12   anal cancer=54gy total dose   Hypertension    Kidney stones    Malignant neoplasm of anal canal 05/12/2012   Malignant neoplasm of other sites of rectum, rectosigmoid junction, and anus 04/12/2012   Myocardial infarction 2012   Personal history of colonic polyps 03/20/2012   Pulmonary embolism    Rectal bleeding    Rectal mass 03/20/2012   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   #4525mm [facility] Ease pericardial tissue valve    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   LIMA to diagonal branch    Seizure disorder, grand mal    only 1   Seizures    after taking Oxycodone; not prescribed seizure med   Shortness of breath    Thrombosed hemorrhoids      Past Surgical History:  Procedure Laterality Date   ANAL FISSURE REPAIR     patient unsure of date   ANTERIOR CERVICAL DECOMP/DISCECTOMY FUSION N/A 01/22/2018   Procedure: ANTERIOR CERVICAL DECOMPRESSION/DISCECTOMY FUSION CERVICAL SIX- CERVICAL SEVEN;  Surgeon: Maeola HarmanStern, Joseph, MD;  [facility];  Service: Neurosurgery;  Laterality: N/A;  ANTERIOR CERVICAL DECOMPRESSION/DISCECTOMY FUSION CERVICAL SIX- CERVICAL SEVEN   AORTIC VALVE REPLACEMENT  08/11/2011   Procedure: AORTIC VALVE REPLACEMENT (AVR);  Surgeon: Purcell Nailslarence H Owen, MD;  [facility];  Service: Open Heart Surgery;  Laterality: N/A;   C4-6 FUSION  1995   DR STERN   COLONOSCOPY     hx polyps   CORONARY ARTERY BYPASS GRAFT  08/16/2011   Procedure: CORONARY ARTERY BYPASS GRAFTING (CABG);  Surgeon: Purcell Nailslarence H Owen, MD;  [facility];  Service: Open Heart Surgery;  Laterality: N/A;  CABG times one using left internal mammary artery   LITHOTRIPSY  2001 AND 2002   DR PETERSON   POLYPECTOMY     colon   Family History  Problem Relation  Age of Onset   Breast cancer Mother    Heart attack Father    Esophageal cancer Maternal Grandfather    Anesthesia problems Neg Hx    Hypotension Neg Hx    Malignant hyperthermia Neg Hx    Pseudochol deficiency Neg Hx    Colon cancer Neg Hx    Colon polyps Neg Hx    Stomach cancer Neg Hx    Rectal cancer Neg Hx    Social History   Tobacco Use   Smoking status: Never   Smokeless tobacco: Never  Vaping Use   Vaping Use: Never used  Substance Use Topics   Alcohol use: Yes    Comment: rarely   Drug use: No   Current Outpatient Medications  Medication Sig Dispense Refill   amitriptyline (  ELAVIL) 25 MG tablet Take 25 mg by mouth at bedtime as needed for sleep.     apixaban (ELIQUIS) 2.5 MG TABS tablet Take 1 tablet (2.5 mg total) by mouth 2 (two) times daily. 60 tablet 0   aspirin EC 81 MG tablet Take 81 mg by mouth daily.     Cholecalciferol 1000 UNITS tablet Take 1,000 Units by mouth daily.     DULoxetine (CYMBALTA) 60 MG capsule Take 60 mg by mouth at bedtime.     FARXIGA 5 MG TABS tablet Take 5 mg by mouth daily.     losartan (COZAAR) 50 MG tablet Take 1 tablet (50 mg total) by mouth daily. 90 tablet 3   metoprolol tartrate (LOPRESSOR) 25 MG tablet TAKE 1 TABLET BY MOUTH TWICE A DAY 180 tablet 3   MYRBETRIQ 25 MG TB24 tablet Take 25 mg by mouth at bedtime.      rosuvastatin (CRESTOR) 40 MG tablet Take 40 mg by mouth at bedtime.     Semaglutide,0.25 or 0.5MG /DOS, (OZEMPIC, 0.25 OR 0.5 MG/DOSE,) 2 MG/3ML SOPN Inject 0.25 mg into the skin once a week.     tadalafil (CIALIS) 10 MG tablet Take 1 tablet (10 mg total) by mouth daily as needed for erectile dysfunction. 30 tablet 1   Tetrahydrozoline HCl (VISINE OP) Place 1 drop into both eyes as needed (for redness/itching). Visine S     Vitamins-Lipotropics (LIPO-FLAVONOID PLUS) TABS Take 1 tablet by mouth 3 (three) times daily after meals.     Zinc Sulfate (ZINC-220 PO) Take 220 mg by mouth daily.     No current  facility-administered medications for this visit.   Allergies  Allergen Reactions   Cat Hair Extract Shortness Of Breath and Swelling   Hydrocodone Other (See Comments)   Atorvastatin Other (See Comments)    Pt has muscle ache on Atorvastatin but IS ABLE TO TAKE LIPITOR without any issues   Dust Mite Extract Other (See Comments) and Swelling   Metformin Other (See Comments)    Diarrhea, muscle cramps   Oxycodone Other (See Comments)    Seizure PATIENT DOES NOT WANT ANY NARCOTICS   Semaglutide(0.25 Or 0.5mg -Dos) Nausea Only    AT HIGHER DOSE     Review of Systems: All systems reviewed and negative except where noted in HPI.   Lab Results  Component Value Date   WBC 7.3 04/29/2022   HGB 15.0 04/29/2022   HCT 47.0 04/29/2022   MCV 100.2 (H) 04/29/2022   PLT 171 04/29/2022    Lab Results  Component Value Date   CREATININE 1.18 04/27/2022   BUN 15 04/27/2022   NA 139 04/27/2022   K 4.3 04/27/2022   CL 107 04/27/2022   CO2 27 04/27/2022    Lab Results  Component Value Date   ALT 24 04/26/2022   AST 23 04/26/2022   ALKPHOS 46 04/26/2022   BILITOT 0.7 04/26/2022     Physical Exam: BP 130/70   Pulse 74   Ht 5\' 10"  (1.778 m)   Wt 232 lb 9.6 oz (105.5 kg)   BMI 33.37 kg/m  Constitutional: Pleasant,well-developed, male in no acute distress. HEENT: Normocephalic and atraumatic. Conjunctivae are normal. No scleral icterus. Neck supple.  Cardiovascular: Normal rate, regular rhythm.  Pulmonary/chest: Effort normal and breath sounds normal. Abdominal: Soft, protuberant with baseline distension,, nontender. There are no masses palpable. Extremities: no edema Lymphadenopathy: No cervical adenopathy noted. Neurological: Alert and oriented to person place and time. Skin: Skin is warm and dry.  No rashes noted. Psychiatric: Normal mood and affect. Behavior is normal.   ASSESSMENT: 77 y.o. male here for assessment of the following  1. History of colon polyps   2.  History of anal cancer   3. Anticoagulated    History of anal cell cancer remotely status postchemotherapy and XRT, with numerous colon polyps removed over the past few years, here to discuss surveillance colonoscopy.  Given the amount of adenomas he had on his last exam in 2020, he is overdue for colonoscopy surveillance.  We discussed colonoscopy, risks and benefits of the exam and anesthesia, he wishes to proceed.  He appears to have recovered well from DVT and PE over the past year, currently on Eliquis.  No cardiopulmonary symptoms.  Last echo looks okay.  Will reach out to Dr. Myrle Sheng to see if okay to hold Eliquis for 2 days prior to this exam.  Further recommendations pending the results.  If no high risk polyps or significant polyp burden, may lengthen surveillance to 5 years for next exam if he wishes to continue surveillance at that point in his life.  PLAN: - schedule colonoscopy at the [facility] - will Dr. Alcide Evener oncology to hold eliquis for 2 days prior to the procedure - further recommendations pending results.  Harlin Rain, MD [facility]

## 2022-12-13 NOTE — Patient Instructions (Addendum)
You have been scheduled for a colonoscopy. Please follow written instructions given to you at your visit today.  Please pick up your prep supplies at the pharmacy within the next 1-3 days. If you use inhalers (even only as needed), please bring them with you on the day of your procedure.  You will be contacted by our office prior to your procedure for directions on holding your ELIQUIS.  If you do not hear from our office 1 week prior to your scheduled procedure, please call [phone] to discuss.    Thank you for entrusting me with your care and for choosing [facility], Dr. Ileene Patrick   If your blood pressure at your visit was 140/90 or greater, please contact your primary care physician to follow up on this. ______________________________________________________  If you are age 21 or older, your body mass index should be between 23-30. Your Body mass index is 33.37 kg/m. If this is out of the aforementioned range listed, please consider follow up with your Primary Care Provider.  If you are age 12 or younger, your body mass index should be between 19-25. Your Body mass index is 33.37 kg/m. If this is out of the aformentioned range listed, please consider follow up with your Primary Care Provider.   ________________________________________________________  The [facility] providers would like to encourage you to use [facility] to communicate with providers for non-urgent requests or questions.  Due to long hold times on the telephone, sending your provider a message by [facility] may be a faster and more efficient way to get a response.  Please allow 48 business hours for a response.  Please remember that this is for non-urgent requests.  _______________________________________________________  Due to recent changes in healthcare laws, you may see the results of your imaging and laboratory studies on MyChart before your provider has had a chance to review them.  We understand that  in some cases there may be results that are confusing or concerning to you. Not all laboratory results come back in the same time frame and the provider may be waiting for multiple results in order to interpret others.  Please give [place] 48 hours in order for your provider to thoroughly review all the results before contacting the office for clarification of your results.

## 2022-12-19 ENCOUNTER — Encounter: Payer: Self-pay | Admitting: *Deleted

## 2022-12-19 NOTE — Progress Notes (Signed)
Received letter from [facility] requesting OK to hold Eliquis 2 days prior to colonoscopy. OK per Dr. Truett Perna. Faxed back to [phone].

## 2022-12-19 NOTE — Telephone Encounter (Signed)
Letter faxed to Dr. Kalman Drape office at [phone]

## 2022-12-20 NOTE — Telephone Encounter (Signed)
Received fax from Dr. Truett Perna.  OK to hold Eliquis for 2 days prior to procedure.   Called patient. He expressed understanding to hold 4-27 and 4-28th. Wife confirmed as well and they confirmed date and time of arrival for colonoscopy

## 2022-12-22 ENCOUNTER — Encounter: Payer: Self-pay | Admitting: Gastroenterology

## 2022-12-24 NOTE — Assessment & Plan Note (Signed)
Has had steroid injections in the past, has noticed some worsening of bilateral knee pain.  Does have upcoming trip planned and would prefer to have steroid injection completed prior to this to assist with better control of symptoms.  Has tolerated prior steroid injections without issue.  These generally have lasted longer than 3 months for him. We will proceed with x-ray imaging for further assessment with plan to arrange for procedural visit in the near future to complete steroid injections We did discuss potential risks and benefits related to steroid injections.  Discussed that over time, these injections may become less effective at which point alternative options can be considered including hyaluronic acid injections, other conservative and procedural interventions, potential surgical dilation

## 2022-12-24 NOTE — Assessment & Plan Note (Signed)
Blood pressure appropriate in office today, can continue current medication regimen, no changes today Recommend intermittent monitoring of blood pressure at home, DASH diet

## 2022-12-24 NOTE — Assessment & Plan Note (Addendum)
Due for recheck of hemoglobin A1c, we will plan to check this today for monitoring Can continue with current medication notable change in A1c present, may need adjustment in pharmacotherapy regimen Patient is also due for urine microalbumin/creatinine ratio assessment, we will complete this today as well

## 2022-12-28 ENCOUNTER — Telehealth (HOSPITAL_BASED_OUTPATIENT_CLINIC_OR_DEPARTMENT_OTHER): Payer: Self-pay | Admitting: Family Medicine

## 2022-12-28 NOTE — Telephone Encounter (Signed)
Patient has an appointment scheduled for 06/06 with Dr.DeCuba

## 2022-12-28 NOTE — Telephone Encounter (Signed)
Spoke with Leotis Shames from [facility], patient is requesting a testosterone prescription. Has never had this medication filled by Dr.DeCuba before, let her know that we will reach out to the patient because he needs to have an appointment scheduled with labs before possibly starting or resuming testosterone that was started by a previous provider. Testosterone is also not on patients med list.

## 2022-12-28 NOTE — Telephone Encounter (Signed)
Dusty with [facility] calling --sent refill request  They need additional information  [phone] fax [phone]

## 2023-01-01 ENCOUNTER — Encounter: Payer: Self-pay | Admitting: Gastroenterology

## 2023-01-01 ENCOUNTER — Ambulatory Visit (AMBULATORY_SURGERY_CENTER): Payer: Medicare Other | Admitting: Gastroenterology

## 2023-01-01 VITALS — BP 126/62 | HR 69 | Temp 97.1°F | Resp 11 | Ht 70.0 in | Wt 232.0 lb

## 2023-01-01 DIAGNOSIS — Z86010 Personal history of colonic polyps: Secondary | ICD-10-CM

## 2023-01-01 DIAGNOSIS — Z09 Encounter for follow-up examination after completed treatment for conditions other than malignant neoplasm: Secondary | ICD-10-CM | POA: Diagnosis not present

## 2023-01-01 DIAGNOSIS — D123 Benign neoplasm of transverse colon: Secondary | ICD-10-CM | POA: Diagnosis not present

## 2023-01-01 DIAGNOSIS — D122 Benign neoplasm of ascending colon: Secondary | ICD-10-CM

## 2023-01-01 MED ORDER — SODIUM CHLORIDE 0.9 % IV SOLN
500.0000 mL | Freq: Once | INTRAVENOUS | Status: DC
Start: 2023-01-01 — End: 2023-01-01

## 2023-01-01 NOTE — Patient Instructions (Addendum)
Resume previous diet Continue present medications, RESUME ELIQUIS TOMORROW AT PREVIOUS DOSE Await pathology results  Handouts/information given for polyps, diverticulosis   YOU HAD AN ENDOSCOPIC PROCEDURE TODAY AT THE [facility]:   Refer to the procedure report that was given to you for any specific questions about what was found during the examination.  If the procedure report does not answer your questions, please call your gastroenterologist to clarify.  If you requested that your care partner not be given the details of your procedure findings, then the procedure report has been included in a sealed envelope for you to review at your convenience later.  YOU SHOULD EXPECT: Some feelings of bloating in the abdomen. Passage of more gas than usual.  Walking can help get rid of the air that was put into your GI tract during the procedure and reduce the bloating. If you had a lower endoscopy (such as a colonoscopy or flexible sigmoidoscopy) you may notice spotting of blood in your stool or on the toilet paper. If you underwent a bowel prep for your procedure, you may not have a normal bowel movement for a few days.  Please Note:  You might notice some irritation and congestion in your nose or some drainage.  This is from the oxygen used during your procedure.  There is no need for concern and it should clear up in a day or so.  SYMPTOMS TO REPORT IMMEDIATELY:  Following lower endoscopy (colonoscopy):  Excessive amounts of blood in the stool  Significant tenderness or worsening of abdominal pains  Swelling of the abdomen that is new, acute  Fever of 100F or higher  For urgent or emergent issues, a gastroenterologist can be reached at any hour by calling [phone]. Do not use MyChart messaging for urgent concerns.   DIET:  We do recommend a small meal at first, but then you may proceed to your regular diet.  Drink plenty of fluids but you should avoid alcoholic beverages for 24  hours.  ACTIVITY:  You should plan to take it easy for the rest of today and you should NOT DRIVE or use heavy machinery until tomorrow (because of the sedation medicines used during the test).    FOLLOW UP: Our staff will call the number listed on your records the next business day following your procedure.  We will call around 7:15- 8:00 am to check on you and address any questions or concerns that you may have regarding the information given to you following your procedure. If we do not reach you, we will leave a message.     If any biopsies were taken you will be contacted by phone or by letter within the next 1-3 weeks.  Please call us at [phone] if you have not heard about the biopsies in 3 weeks.   SIGNATURES/CONFIDENTIALITY: You and/or your care partner have signed paperwork which will be entered into your electronic medical record.  These signatures attest to the fact that that the information above on your After Visit Summary has been reviewed and is understood.  Full responsibility of the confidentiality of this discharge information lies with you and/or your care-partner.

## 2023-01-01 NOTE — Op Note (Addendum)
[facility] Patient Name: Kirk [name] Procedure Date: 01/01/2023 1:49 PM MRN: [id number] Endoscopist: Viviann Spare P. Adela Lank , MD, [id number] Age: 77 Referring MD:  Date of Birth: 04/15/1946 Gender: Male Account #: [id number] Procedure:                Colonoscopy Indications:              High risk colon cancer surveillance: Personal                            history of colonic polyps - history of anal cancer                            with radiation in the past, last exam 2020 - 7                            polyps removed Medicines:                Monitored Anesthesia Care Procedure:                Pre-Anesthesia Assessment:                           - Prior to the procedure, a History and Physical                            was performed, and patient medications and                            allergies were reviewed. The patient's tolerance of                            previous anesthesia was also reviewed. The risks                            and benefits of the procedure and the sedation                            options and risks were discussed with the patient.                            All questions were answered, and informed consent                            was obtained. Prior Anticoagulants: The patient has                            taken Eliquis (apixaban), last dose was 2 days                            prior to procedure. ASA Grade Assessment: III - A                            patient with severe systemic disease. After  reviewing the risks and benefits, the patient was                            deemed in satisfactory condition to undergo the                            procedure.                           After obtaining informed consent, the colonoscope                            was passed under direct vision. Throughout the                            procedure, the patient's blood pressure, pulse, and                             oxygen saturations were monitored continuously. The                            CF [id number] #[id number] was introduced through the anus                            and advanced to the the cecum, identified by                            appendiceal orifice and ileocecal valve. The                            colonoscopy was performed without difficulty. The                            patient tolerated the procedure well. The quality                            of the bowel preparation was good. The ileocecal                            valve, appendiceal orifice, and rectum were                            photographed. Scope In: 2:21:37 PM Scope Out: 2:48:28 PM Scope Withdrawal Time: 0 hours 21 minutes 34 seconds  Total Procedure Duration: 0 hours 26 minutes 51 seconds  Findings:                 The perianal and digital rectal examinations were                            normal.                           A 4 to 5 mm polyp was found in the ascending colon.  The polyp was sessile. The polyp was removed with a                            cold snare. Resection and retrieval were complete.                           Three sessile polyps were found in the hepatic                            flexure. The polyps were 3 mm in size. These polyps                            were removed with a cold snare. Resection and                            retrieval were complete.                           Six sessile polyps were found in the transverse                            colon. The polyps were 3 to 4 mm in size. These                            polyps were removed with a cold snare. Resection                            and retrieval were complete.                           Multiple small-mouthed diverticula were found in                            the sigmoid colon.                           Multiple small angiodysplastic lesions were found                            in the rectum,  along with atrophic mucosa - grossly                            consistent with mild radiation proctitis.                           The exam was otherwise without abnormality. Of                            note, rectum is narrow from prior radiation                            therapy, retroflexed views not obtained. Complications:            No immediate complications. Estimated blood loss:  Minimal. Estimated Blood Loss:     Estimated blood loss was minimal. Impression:               - One 4 to 5 mm polyp in the ascending colon,                            removed with a cold snare. Resected and retrieved.                           - Three 3 mm polyps at the hepatic flexure, removed                            with a cold snare. Resected and retrieved.                           - Six 3 to 4 mm polyps in the transverse colon,                            removed with a cold snare. Resected and retrieved.                           - Diverticulosis in the sigmoid colon.                           - Multiple colonic angiodysplastic lesions.                           - The examination was otherwise normal.                           - The GI Genius (intelligent endoscopy module),                            computer-aided polyp detection system powered by AI                            was utilized to detect colorectal polyps through                            enhanced visualization during colonoscopy. Recommendation:           - Patient has a contact number available for                            emergencies. The signs and symptoms of potential                            delayed complications were discussed with the                            patient. Return to normal activities tomorrow.                            Written discharge instructions were provided to the  patient.                           - Resume previous diet.                            - Continue present medications.                           - Resume Eliquis tomorrow.                           - Await pathology results. Viviann Spare P. Adela Lank, MD 01/01/2023 2:58:43 PM This report has been signed electronically.

## 2023-01-01 NOTE — Progress Notes (Signed)
Sedate, gd SR, tolerated procedure well, VSS, report to RN 

## 2023-01-01 NOTE — Progress Notes (Signed)
Called to room to assist during endoscopic procedure.  Patient ID and intended procedure confirmed with present staff. Received instructions for my participation in the procedure from the performing physician.  

## 2023-01-01 NOTE — Progress Notes (Signed)
History and Physical Interval Note: Seen 12/13/22 - no interval changes. History of numerous colon polyps, history of anal cancer treated in 2013. Here for surveillance colonoscopy. Last exam 06/2019 - 7 polyps removed, in 2019 he had 10 polyps removed. On Eliquis for history of PE - held for 2 days prior to this exam. Otherwise feels well without complaints today and wishes to proceed.   01/01/2023 2:16 PM  Kirk [name]  has presented today for endoscopic procedure(s), with the diagnosis of  Encounter Diagnosis  Name Primary?   History of colon polyps Yes  .  The various methods of evaluation and treatment have been discussed with the patient and/or family. After consideration of risks, benefits and other options for treatment, the patient has consented to  the endoscopic procedure(s).   The patient's history has been reviewed, patient examined, no change in status, stable for surgery.  I have reviewed the patient's chart and labs.  Questions were answered to the patient's satisfaction.    Harlin Rain, MD [facility]

## 2023-01-02 ENCOUNTER — Telehealth: Payer: Self-pay | Admitting: *Deleted

## 2023-01-02 NOTE — Telephone Encounter (Signed)
  Follow up Call-     01/01/2023    1:59 PM  Call back number  Post procedure Call Back phone  # [phone]  Permission to leave phone message Yes     Patient questions:  Do you have a fever, pain , or abdominal swelling? No. Pain Score  0 *  Have you tolerated food without any problems? Yes.    Have you been able to return to your normal activities? Yes.    Do you have any questions about your discharge instructions: Diet   No. Medications  No. Follow up visit  No.  Do you have questions or concerns about your Care? No.  Actions: * If pain score is 4 or above: No action needed, pain <4.

## 2023-01-03 ENCOUNTER — Telehealth (HOSPITAL_BASED_OUTPATIENT_CLINIC_OR_DEPARTMENT_OTHER): Payer: Self-pay | Admitting: Family Medicine

## 2023-01-03 NOTE — Telephone Encounter (Signed)
Spoke with patients wife, I let her know that Dr.DeCuba did not mention in any of his notes that he would refill the testosterone not on his medicine list so we cannot send in testosterone. Offered to place a lab order for patient to come get testosterone levels checked per the NP in office, they declined. Offered to bring patient in for an appointment sooner than June to address the testosterone, they again declined. Will keep scheduled appointment for June.

## 2023-01-03 NOTE — Telephone Encounter (Signed)
Patient wife called stated was told by Dr. Ihor Dow being they transferred over any medication needed he would refill. He is in need of Testosterone.. if any question please give her a callback

## 2023-01-10 ENCOUNTER — Encounter: Payer: Self-pay | Admitting: Gastroenterology

## 2023-01-18 DIAGNOSIS — C4442 Squamous cell carcinoma of skin of scalp and neck: Secondary | ICD-10-CM | POA: Diagnosis not present

## 2023-01-18 DIAGNOSIS — L820 Inflamed seborrheic keratosis: Secondary | ICD-10-CM | POA: Diagnosis not present

## 2023-01-18 DIAGNOSIS — L298 Other pruritus: Secondary | ICD-10-CM | POA: Diagnosis not present

## 2023-01-18 DIAGNOSIS — D485 Neoplasm of uncertain behavior of skin: Secondary | ICD-10-CM | POA: Diagnosis not present

## 2023-01-24 ENCOUNTER — Telehealth (HOSPITAL_BASED_OUTPATIENT_CLINIC_OR_DEPARTMENT_OTHER): Payer: Self-pay | Admitting: Family Medicine

## 2023-01-24 NOTE — Telephone Encounter (Signed)
  Asking for 90 days refill of the following be sent in  [facility] 5 MG TABS tablet    Losartin 50mg     [id number] - [place] ([place]), [place] - [address]

## 2023-01-25 ENCOUNTER — Other Ambulatory Visit (HOSPITAL_BASED_OUTPATIENT_CLINIC_OR_DEPARTMENT_OTHER): Payer: Self-pay

## 2023-01-25 DIAGNOSIS — E119 Type 2 diabetes mellitus without complications: Secondary | ICD-10-CM

## 2023-01-25 DIAGNOSIS — I10 Essential (primary) hypertension: Secondary | ICD-10-CM

## 2023-01-25 MED ORDER — LOSARTAN POTASSIUM 50 MG PO TABS
50.0000 mg | ORAL_TABLET | Freq: Every day | ORAL | 1 refills | Status: DC
Start: 2023-01-25 — End: 2023-07-31

## 2023-01-25 MED ORDER — FARXIGA 5 MG PO TABS
5.00 mg | ORAL_TABLET | Freq: Every day | ORAL | 1 refills | Status: DC
Start: 2023-01-25 — End: 2023-03-13

## 2023-02-06 ENCOUNTER — Encounter (HOSPITAL_BASED_OUTPATIENT_CLINIC_OR_DEPARTMENT_OTHER): Payer: Self-pay

## 2023-02-06 ENCOUNTER — Ambulatory Visit (INDEPENDENT_AMBULATORY_CARE_PROVIDER_SITE_OTHER): Payer: Medicare Other

## 2023-02-06 VITALS — Ht 71.0 in | Wt 230.0 lb

## 2023-02-06 DIAGNOSIS — Z0000 Encounter for general adult medical examination without abnormal findings: Secondary | ICD-10-CM

## 2023-02-06 NOTE — Patient Instructions (Signed)
[name] , Thank you for taking time to come for your Medicare Wellness Visit. I appreciate your ongoing commitment to your health goals. Please review the following plan we discussed and let me know if I can assist you in the future.   These are the goals we discussed:  Goals      Patient Stated     Patient states his goal is to "wake up every morning and be happy".         This is a list of the screening recommended for you and due dates:  Health Maintenance  Topic Date Due   Complete foot exam   Never done   Eye exam for diabetics  Never done   Hepatitis C Screening  Never done   Pneumonia Vaccine (1 of 1 - PCV) Never done   COVID-19 Vaccine (4 - 2023-24 season) 05/05/2022   Flu Shot  04/05/2023   Yearly kidney function blood test for diabetes  04/28/2023   Hemoglobin A1C  05/26/2023   Yearly kidney health urinalysis for diabetes  11/23/2023   Medicare Annual Wellness Visit  02/06/2024   Colon Cancer Screening  12/31/2025   DTaP/Tdap/Td vaccine (3 - Td or Tdap) 05/27/2029   Zoster (Shingles) Vaccine  Completed   HPV Vaccine  Aged Out    Advanced directives: Advance directive discussed with you today. Even though you declined this today, please call our office should you change your mind, and we can give you the proper paperwork for you to fill out. Advance care planning is a way to make decisions about medical care that fits your values in case you are ever unable to make these decisions for yourself.  Information on Advanced Care Planning can be found at [facility] Advance Health Care Directives Advance Health Care Directives ([email or web address]    Conditions/risks identified: Aim for 30 minutes of exercise or brisk walking, 6-8 glasses of water, and 5 servings of fruits and vegetables each day.   Next appointment: Follow up in one year for your annual wellness visit. February 12, 2024 at 8:30am TELEPHONE VISIT   Preventive Care 23 Years and Older,  Male  Preventive care refers to lifestyle choices and visits with your health care provider that can promote health and wellness. What does preventive care include? A yearly physical exam. This is also called an annual well check. Dental exams once or twice a year. Routine eye exams. Ask your health care provider how often you should have your eyes checked. Personal lifestyle choices, including: Daily care of your teeth and gums. Regular physical activity. Eating a healthy diet. Avoiding tobacco and drug use. Limiting alcohol use. Practicing safe sex. Taking low doses of aspirin every day. Taking vitamin and mineral supplements as recommended by your health care provider. What happens during an annual well check? The services and screenings done by your health care provider during your annual well check will depend on your age, overall health, lifestyle risk factors, and family history of disease. Counseling  Your health care provider may ask you questions about your: Alcohol use. Tobacco use. Drug use. Emotional well-being. Home and relationship well-being. Sexual activity. Eating habits. History of falls. Memory and ability to understand (cognition). Work and work Astronomer. Screening  You may have the following tests or measurements: Height, weight, and BMI. Blood pressure. Lipid and cholesterol levels. These may be checked every 5 years, or more frequently if you are over 62 years old. Skin check. Lung cancer screening.  You may have this screening every year starting at age 55 if you have a 30-pack-year history of smoking and currently smoke or have quit within the past 15 years. Fecal occult blood test (FOBT) of the stool. You may have this test every year starting at age 23. Flexible sigmoidoscopy or colonoscopy. You may have a sigmoidoscopy every 5 years or a colonoscopy every 10 years starting at age 12. Prostate cancer screening. Recommendations will vary depending  on your family history and other risks. Hepatitis C blood test. Hepatitis B blood test. Sexually transmitted disease (STD) testing. Diabetes screening. This is done by checking your blood sugar (glucose) after you have not eaten for a while (fasting). You may have this done every 1-3 years. Abdominal aortic aneurysm (AAA) screening. You may need this if you are a current or former smoker. Osteoporosis. You may be screened starting at age 51 if you are at high risk. Talk with your health care provider about your test results, treatment options, and if necessary, the need for more tests. Vaccines  Your health care provider may recommend certain vaccines, such as: Influenza vaccine. This is recommended every year. Tetanus, diphtheria, and acellular pertussis (Tdap, Td) vaccine. You may need a Td booster every 10 years. Zoster vaccine. You may need this after age 25. Pneumococcal 13-valent conjugate (PCV13) vaccine. One dose is recommended after age 28. Pneumococcal polysaccharide (PPSV23) vaccine. One dose is recommended after age 62. Talk to your health care provider about which screenings and vaccines you need and how often you need them. This information is not intended to replace advice given to you by your health care provider. Make sure you discuss any questions you have with your health care provider. Document Released: 09/17/2015 Document Revised: 05/10/2016 Document Reviewed: 06/22/2015 Elsevier Interactive Patient Education  2017 [employer].  Fall Prevention in the Home Falls can cause injuries. They can happen to people of all ages. There are many things you can do to make your home safe and to help prevent falls. What can I do on the outside of my home? Regularly fix the edges of walkways and driveways and fix any cracks. Remove anything that might make you trip as you walk through a door, such as a raised step or threshold. Trim any bushes or trees on the path to your home. Use  bright outdoor lighting. Clear any walking paths of anything that might make someone trip, such as rocks or tools. Regularly check to see if handrails are loose or broken. Make sure that both sides of any steps have handrails. Any raised decks and porches should have guardrails on the edges. Have any leaves, snow, or ice cleared regularly. Use sand or salt on walking paths during winter. Clean up any spills in your garage right away. This includes oil or grease spills. What can I do in the bathroom? Use night lights. Install grab bars by the toilet and in the tub and shower. Do not use towel bars as grab bars. Use non-skid mats or decals in the tub or shower. If you need to sit down in the shower, use a plastic, non-slip stool. Keep the floor dry. Clean up any water that spills on the floor as soon as it happens. Remove soap buildup in the tub or shower regularly. Attach bath mats securely with double-sided non-slip rug tape. Do not have throw rugs and other things on the floor that can make you trip. What can I do in the bedroom? Use night lights.  Make sure that you have a light by your bed that is easy to reach. Do not use any sheets or blankets that are too big for your bed. They should not hang down onto the floor. Have a firm chair that has side arms. You can use this for support while you get dressed. Do not have throw rugs and other things on the floor that can make you trip. What can I do in the kitchen? Clean up any spills right away. Avoid walking on wet floors. Keep items that you use a lot in easy-to-reach places. If you need to reach something above you, use a strong step stool that has a grab bar. Keep electrical cords out of the way. Do not use floor polish or wax that makes floors slippery. If you must use wax, use non-skid floor wax. Do not have throw rugs and other things on the floor that can make you trip. What can I do with my stairs? Do not leave any items on the  stairs. Make sure that there are handrails on both sides of the stairs and use them. Fix handrails that are broken or loose. Make sure that handrails are as long as the stairways. Check any carpeting to make sure that it is firmly attached to the stairs. Fix any carpet that is loose or worn. Avoid having throw rugs at the top or bottom of the stairs. If you do have throw rugs, attach them to the floor with carpet tape. Make sure that you have a light switch at the top of the stairs and the bottom of the stairs. If you do not have them, ask someone to add them for you. What else can I do to help prevent falls? Wear shoes that: Do not have high heels. Have rubber bottoms. Are comfortable and fit you well. Are closed at the toe. Do not wear sandals. If you use a stepladder: Make sure that it is fully opened. Do not climb a closed stepladder. Make sure that both sides of the stepladder are locked into place. Ask someone to hold it for you, if possible. Clearly mark and make sure that you can see: Any grab bars or handrails. First and last steps. Where the edge of each step is. Use tools that help you move around (mobility aids) if they are needed. These include: Canes. Walkers. Scooters. Crutches. Turn on the lights when you go into a dark area. Replace any light bulbs as soon as they burn out. Set up your furniture so you have a clear path. Avoid moving your furniture around. If any of your floors are uneven, fix them. If there are any pets around you, be aware of where they are. Review your medicines with your doctor. Some medicines can make you feel dizzy. This can increase your chance of falling. Ask your doctor what other things that you can do to help prevent falls. This information is not intended to replace advice given to you by your health care provider. Make sure you discuss any questions you have with your health care provider. Document Released: 06/17/2009 Document Revised:  01/27/2016 Document Reviewed: 09/25/2014 Elsevier Interactive Patient Education  2017 [employer].

## 2023-02-06 NOTE — Progress Notes (Signed)
 I connected with  Kirk [name] on 02/06/23 by a audio enabled telemedicine application and verified that I am speaking with the correct person using two identifiers.  Patient Location: Home  Provider Location: Office/Clinic  I discussed the limitations of evaluation and management by telemedicine. The patient expressed understanding and agreed to proceed.  Subjective:   Kirk [name] is a 77 y.o. male who presents for Medicare Annual/Subsequent preventive examination.  Review of Systems     Cardiac Risk Factors include: advanced age (>20men, >39 women);diabetes mellitus;dyslipidemia;hypertension;obesity (BMI >30kg/m2);male gender     Objective:    Today's Vitals   02/06/23 0821 02/06/23 0822  Weight: 230 lb (104.3 kg)   Height: 5\' 11"  (1.803 m)   PainSc:  5    Body mass index is 32.08 kg/m.     10/06/2022   11:22 AM 07/06/2022   11:52 AM 04/25/2022   11:09 PM 08/13/2020   12:43 PM 08/15/2018    8:18 AM 07/09/2017    3:22 PM 02/13/2017    8:57 AM  Advanced Directives  Does Patient Have a Medical Advance Directive? No No No Yes Yes Yes Yes  Type of Theme park manager;Living will Healthcare Power of [place];Living will Healthcare Power of [place];Living will Healthcare Power of [place];Living will  Does patient want to make changes to medical advance directive?    No - Patient declined No - Patient declined    Copy of Healthcare Power of Attorney in Chart?    No - copy requested   No - copy requested  Would patient like information on creating a medical advance directive?   No - Patient declined        Current Medications (verified) Outpatient Encounter Medications as of 02/06/2023  Medication Sig   amitriptyline (ELAVIL) 25 MG tablet Take 25 mg by mouth at bedtime as needed for sleep.   apixaban (ELIQUIS) 2.5 MG TABS tablet Take 1 tablet (2.5 mg total) by mouth 2 (two) times daily.   aspirin EC 81 MG tablet Take 81 mg by mouth daily.    Cholecalciferol 1000 UNITS tablet Take 1,000 Units by mouth daily.   DULoxetine (CYMBALTA) 60 MG capsule Take 60 mg by mouth at bedtime.   FARXIGA 5 MG TABS tablet Take 1 tablet (5 mg total) by mouth daily.   losartan (COZAAR) 50 MG tablet Take 1 tablet (50 mg total) by mouth daily.   metoprolol tartrate (LOPRESSOR) 25 MG tablet TAKE 1 TABLET BY MOUTH TWICE A DAY   MYRBETRIQ 25 MG TB24 tablet Take 25 mg by mouth at bedtime.    rosuvastatin (CRESTOR) 40 MG tablet Take 40 mg by mouth at bedtime.   Semaglutide,0.25 or 0.5MG /DOS, (OZEMPIC, 0.25 OR 0.5 MG/DOSE,) 2 MG/3ML SOPN Inject 0.25 mg into the skin once a week.   tadalafil (CIALIS) 10 MG tablet Take 1 tablet (10 mg total) by mouth daily as needed for erectile dysfunction.   Tetrahydrozoline HCl (VISINE OP) Place 1 drop into both eyes as needed (for redness/itching). Visine S   Vitamins-Lipotropics (LIPO-FLAVONOID PLUS) TABS Take 1 tablet by mouth 3 (three) times daily after meals.   Zinc Sulfate (ZINC-220 PO) Take 220 mg by mouth daily.   No facility-administered encounter medications on file as of 02/06/2023.    Allergies (verified) Cat hair extract, Hydrocodone, Atorvastatin, Dust mite extract, Metformin, Oxycodone, and Semaglutide(0.25 or 0.5mg -dos)   History: Past Medical History:  Diagnosis Date   Allergy    Anal  cancer (HCC) 04/2012   Angina    6 wks. ago   Aortic stenosis    valve replacement   Arthritis    1995- cerv. 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   CHF (congestive heart failure) (HCC)    Coronary artery disease    aortic stenosis, CAD   Diabetes mellitus    Type 2   DVT (deep venous thrombosis) (HCC)    ED (erectile dysfunction)    ED (erectile dysfunction)    Elevated cholesterol    Heart murmur    no problems   History of kidney stones    passed,  lithrotrispy and surgery   History of radiation therapy 05/20/12-06/28/12   anal cancer=54gy total dose   Hypertension    Kidney stones    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   Malignant neoplasm of other sites of rectum, rectosigmoid junction, and anus 04/12/2012   Myocardial infarction (HCC) 2012   Personal history of colonic polyps 03/20/2012   Pulmonary embolism (HCC)    Rectal bleeding    Rectal mass 03/20/2012   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   #68mm [facility] Ease pericardial tissue valve    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   LIMA to diagonal branch    Seizure disorder, grand mal (HCC)    only 1   Seizures (HCC)    after taking Oxycodone; not prescribed seizure med   Shortness of breath    Thrombosed hemorrhoids    Past Surgical History:  Procedure Laterality Date   ANAL FISSURE REPAIR     patient unsure of date   ANTERIOR CERVICAL DECOMP/DISCECTOMY FUSION N/A 01/22/2018   Procedure: ANTERIOR CERVICAL DECOMPRESSION/DISCECTOMY FUSION CERVICAL SIX- CERVICAL SEVEN;  Surgeon: Maeola Harman, MD;  Location: [facility] OR;  Service: Neurosurgery;  Laterality: N/A;  ANTERIOR CERVICAL DECOMPRESSION/DISCECTOMY FUSION CERVICAL SIX- CERVICAL SEVEN   AORTIC VALVE REPLACEMENT  08/11/2011   Procedure: AORTIC VALVE REPLACEMENT (AVR);  Surgeon: Purcell Nails, MD;  Location: [facility] OR;  Service: Open Heart Surgery;  Laterality: N/A;   C4-6 FUSION  1995   DR STERN   COLONOSCOPY     hx polyps   CORONARY ARTERY BYPASS GRAFT  08/16/2011   Procedure: CORONARY ARTERY BYPASS GRAFTING (CABG);  Surgeon: Purcell Nails, MD;  Location: [facility] OR;  Service: Open Heart Surgery;  Laterality: N/A;  CABG times one using left internal mammary artery   LITHOTRIPSY  2001 AND 2002   DR PETERSON   POLYPECTOMY     colon   Family History  Problem Relation Age of Onset   Breast cancer Mother    Heart attack Father    Esophageal cancer Maternal Grandfather    Anesthesia problems Neg Hx     Hypotension Neg Hx    Malignant hyperthermia Neg Hx    Pseudochol deficiency Neg Hx    Colon cancer Neg Hx    Colon polyps Neg Hx    Stomach cancer Neg Hx    Rectal cancer Neg Hx    Social History   Socioeconomic History   Marital status: Married    Spouse name: [name]   Number of children: 2   Years of education: Not on file   Highest education level: Not on file  Occupational History   Occupation: Surveyor, minerals semi-retired  Tobacco Use   Smoking status: Never   Smokeless tobacco: Never  Vaping Use   Vaping Use: Never used  Substance and Sexual Activity   Alcohol use: Yes    Comment: rarely   Drug use: No   Sexual activity: Not on file  Other Topics Concern   Not on file  Social History Narrative   Rockne Menghini painter   Social Determinants of Health   Financial Resource Strain: Low Risk  (02/06/2023)   Overall Financial Resource Strain (CARDIA)    Difficulty of Paying Living Expenses: Not hard at all  Food Insecurity: No Food Insecurity (02/06/2023)   Hunger Vital Sign    Worried About Running Out of Food in the Last Year: Never true    Ran Out of Food in the Last Year: Never true  Transportation Needs: No Transportation Needs (02/06/2023)   PRAPARE - Administrator, Civil Service (Medical): No    Lack of Transportation (Non-Medical): No  Physical Activity: Sufficiently Active (02/06/2023)   Exercise Vital Sign    Days of Exercise per Week: 7 days    Minutes of Exercise per Session: 30 min  Stress: No Stress Concern Present (02/06/2023)   [employer] of Occupational Health - Occupational Stress Questionnaire    Feeling of Stress : Not at all  Social Connections: Socially Integrated (02/06/2023)   Social Connection and Isolation Panel [NHANES]    Frequency of Communication with Friends and Family: More than three times a week    Frequency of Social Gatherings with Friends and Family: More than three times a week    Attends  Religious Services: More than 4 times per year    Active Member of [employer] or Organizations: Yes    Attends Engineer, structural: More than 4 times per year    Marital Status: Married    Tobacco Counseling Counseling given: Yes   Clinical Intake:  Pre-visit preparation completed: Yes  Pain : 0-10 Pain Score: 5  Pain Type: Acute pain Pain Location: Neck Pain Orientation: Right Pain Descriptors / Indicators: Spasm, Squeezing, Sore Pain Onset: 1 to 4 weeks ago Pain Frequency: Constant     BMI - recorded: 32.08 Nutritional Risks: None Diabetes: Yes CBG done?: No Did pt. bring in CBG monitor from home?: No  How often do you need to have someone help you when you read instructions, pamphlets, or other written materials from your doctor or pharmacy?: 1 - Never  Diabetic? YES  Nutrition Risk Assessment:  Has the patient had any N/V/D within the last 2 months?  No  Does the patient have any non-healing wounds?  No  Has the patient had any unintentional weight loss or weight gain?  No   Diabetes:  Is the patient diabetic?  Yes  If diabetic, was a CBG obtained today?  No  Did the patient bring in their glucometer from home?  No  How often do you monitor your CBG's? He doesn't check them at home.   Financial Strains and Diabetes Management:  Are you having any financial strains with the device, your supplies or your medication? No .  Does the patient want to be seen by Chronic Care Management for management of their diabetes?  No  Would the patient like to be referred to a Nutritionist or for Diabetic Management?  No   Diabetic Exams:  Diabetic Eye Exam: Completed 2024 Diabetic Foot Exam: Overdue, Pt has been advised about the importance in completing this  exam. Pt is scheduled for diabetic foot exam on message sent.   Interpreter Needed?: No  Information entered by ::  Dempsy Damiano, CMA   Activities of Daily Living    02/06/2023    8:28 AM 11/23/2022     2:05 PM  In your present state of health, do you have any difficulty performing the following activities:  Hearing? 0 0  Vision? 0 0  Difficulty concentrating or making decisions? 0 0  Walking or climbing stairs? 0 0  Dressing or bathing? 0 0  Doing errands, shopping? 0 0  Preparing Food and eating ? N   Using the Toilet? N   In the past six months, have you accidently leaked urine? N   Do you have problems with loss of bowel control? N   Managing your Medications? N   Managing your Finances? N   Housekeeping or managing your Housekeeping? N     Patient Care Team: [name], Buren Kos, MD as PCP - General (Family Medicine) Jake Bathe, MD as PCP - Cardiology (Cardiology) Purcell Nails, MD (Inactive) (Cardiothoracic Surgery) Jake Bathe, MD as Referring Physician (Cardiology) Dorothy Puffer, MD (Radiation Oncology) [name], MD as Consulting Physician (Oncology)  Indicate any recent Medical Services you may have received from other than [facility] providers in the past year (date may be approximate).     Assessment:   This is a routine wellness examination for Jaquill.  Hearing/Vision screen Hearing Screening - Comments:: Patient wears hearing aids due to hearing difficulties.   Vision Screening - Comments:: Wears rx glasses - up to date with routine eye exams with    Dietary issues and exercise activities discussed: Current Exercise Habits: Home exercise routine, Type of exercise: walking, Time (Minutes): 30, Frequency (Times/Week): 7, Weekly Exercise (Minutes/Week): 210, Intensity: Mild, Exercise limited by: None identified   Goals Addressed             This Visit's Progress    Patient Stated       Patient states his goal is to "wake up every morning and be happy".        Depression Screen    02/06/2023    8:26 AM 11/23/2022    2:04 PM 07/09/2017    3:23 PM 04/05/2015    8:01 AM  PHQ 2/9 Scores  PHQ - 2 Score 0 0 0 0  Exception Documentation  Medical  reason      Fall Risk    02/06/2023    8:24 AM 11/23/2022    2:04 PM 07/09/2017    3:23 PM 10/09/2016    8:07 AM 04/05/2015    8:01 AM  Fall Risk   Falls in the past year? 0 0 No No No  Number falls in past yr: 0 0     Injury with Fall? 0 0     Risk for fall due to : No Fall Risks No Fall Risks     Follow up Falls prevention discussed Falls evaluation completed       FALL RISK PREVENTION PERTAINING TO THE HOME:  Any stairs in or around the home? No  If so, are there any without handrails? No  Home free of loose throw rugs in walkways, pet beds, electrical cords, etc? Yes  Adequate lighting in your home to reduce risk of falls? Yes   ASSISTIVE DEVICES UTILIZED TO PREVENT FALLS:  Life alert? No  Use of a cane, walker or w/c? No  Grab bars in the  bathroom? No  Shower chair or bench in shower? No  Elevated toilet seat or a handicapped toilet? No   TIMED UP AND GO:  Was the test performed? No .   Cognitive Function:        Immunizations Immunization History  Administered Date(s) Administered   Influenza Split 06/15/2014, 06/04/2018, 06/05/2019   Influenza, High Dose Seasonal PF 07/06/2017   Influenza-Unspecified 06/04/2020, 09/13/2022   PFIZER(Purple Top)SARS-COV-2 Vaccination 09/26/2019, 10/17/2019, 07/01/2020   Tdap 04/10/2018, 05/28/2019   Zoster Recombinat (Shingrix) 04/10/2018, 10/18/2018   Zoster, Live 04/17/2013    TDAP status: Up to date  Flu Vaccine status: Up to date  Pneumococcal vaccine status: Due, Education has been provided regarding the importance of this vaccine. Advised may receive this vaccine at local pharmacy or Health Dept. Aware to provide a copy of the vaccination record if obtained from local pharmacy or Health Dept. Verbalized acceptance and understanding.  Covid-19 vaccine status: Information provided on how to obtain vaccines.   Qualifies for Shingles Vaccine? Yes   Zostavax completed No   Shingrix Completed?: No.    Education has been  provided regarding the importance of this vaccine. Patient has been advised to call insurance company to determine out of pocket expense if they have not yet received this vaccine. Advised may also receive vaccine at local pharmacy or Health Dept. Verbalized acceptance and understanding.  Screening Tests Health Maintenance  Topic Date Due   FOOT EXAM  Never done   OPHTHALMOLOGY EXAM  Never done   Hepatitis C Screening  Never done   Pneumonia Vaccine 19+ Years old (1 of 1 - PCV) Never done   Medicare Annual Wellness (AWV)  05/05/2021   COVID-19 Vaccine (4 - 2023-24 season) 05/05/2022   INFLUENZA VACCINE  04/05/2023   Diabetic kidney evaluation - eGFR measurement  04/28/2023   HEMOGLOBIN A1C  05/26/2023   Diabetic kidney evaluation - Urine ACR  11/23/2023   Colonoscopy  12/31/2025   DTaP/Tdap/Td (3 - Td or Tdap) 05/27/2029   Zoster Vaccines- Shingrix  Completed   HPV VACCINES  Aged Out    Health Maintenance  Health Maintenance Due  Topic Date Due   FOOT EXAM  Never done   OPHTHALMOLOGY EXAM  Never done   Hepatitis C Screening  Never done   Pneumonia Vaccine 39+ Years old (1 of 1 - PCV) Never done   Medicare Annual Wellness (AWV)  05/05/2021   COVID-19 Vaccine (4 - 2023-24 season) 05/05/2022    Colorectal cancer screening: Type of screening: Colonoscopy. Completed 01/01/2023. Repeat every 7 years  Lung Cancer Screening: (Low Dose CT Chest recommended if Age 3-80 years, 30 pack-year currently smoking OR have quit w/in 15years.) does not qualify.   Additional Screening:  Hepatitis C Screening: does qualify  Vision Screening: Recommended annual ophthalmology exams for early detection of glaucoma and other disorders of the eye. Is the patient up to date with their annual eye exam?  Yes  Who is the provider or what is the name of the office in which the patient attends annual eye exams? Dr. Senaida Ores  Dental Screening: Recommended annual dental exams for proper oral  hygiene  Community Resource Referral / Chronic Care Management: CRR required this visit?  No   CCM required this visit?  No      Plan:     I have personally reviewed and noted the following in the patient's chart:   Medical and social history Use of alcohol, tobacco or illicit drugs  Current  medications and supplements including opioid prescriptions. Patient is not currently taking opioid prescriptions. Functional ability and status Nutritional status Physical activity Advanced directives List of other physicians Hospitalizations, surgeries, and ER visits in previous 12 months Vitals Screenings to include cognitive, depression, and falls Referrals and appointments  In addition, I have reviewed and discussed with patient certain preventive protocols, quality metrics, and best practice recommendations. A written personalized care plan for preventive services as well as general preventive health recommendations were provided to patient.   Due to this being a telephonic visit, the after visit summary with patients personalized plan was offered to patient via mail or my-chart. Patient would like to access their AVS via my-chart    Jordan Hawks Leverne Tessler, CMA   02/06/2023   Nurse Notes: PATIENT NEEDS A DIABETIC FOOT EXAM.

## 2023-02-08 ENCOUNTER — Encounter (HOSPITAL_BASED_OUTPATIENT_CLINIC_OR_DEPARTMENT_OTHER): Payer: Self-pay | Admitting: Family Medicine

## 2023-02-08 ENCOUNTER — Ambulatory Visit (INDEPENDENT_AMBULATORY_CARE_PROVIDER_SITE_OTHER): Payer: Medicare Other | Admitting: Family Medicine

## 2023-02-08 VITALS — BP 135/71 | HR 74 | Ht 71.0 in | Wt 233.6 lb

## 2023-02-08 DIAGNOSIS — M542 Cervicalgia: Secondary | ICD-10-CM | POA: Diagnosis not present

## 2023-02-08 DIAGNOSIS — R7989 Other specified abnormal findings of blood chemistry: Secondary | ICD-10-CM | POA: Insufficient documentation

## 2023-02-08 DIAGNOSIS — M170 Bilateral primary osteoarthritis of knee: Secondary | ICD-10-CM | POA: Diagnosis not present

## 2023-02-08 NOTE — Assessment & Plan Note (Signed)
Patient reports that about 2 weeks ago, he was helping family member with moving when he felt his right knee give out and hit his jaw and developed right-sided neck pain.  Pain has persisted, he has found it difficult to look to the right, has impacted sleep at times as well.  Has been using conservative measures thus far to try to help with symptoms.  Denies any radiation of pain, no associated numbness or tingling. On exam, patient is no acute distress, does have moderate tenderness to palpation near right side of base of occiput extending inferior through cervical paraspinal muscles.  Largely normal range of motion at bilateral shoulders, no significant pain at shoulders Suspect cervical paraspinal muscle spasm/strain.  Feel the patient would benefit from evaluation and treatment with physical therapy, patient in agreement, referral placed today.  Advised that PT will likely provide patient with home exercise program to proceed with as well.  Can utilize heat or ice as preferred.  If symptoms not responding as expected, would proceed with imaging as next step

## 2023-02-08 NOTE — Patient Instructions (Signed)
.  avs

## 2023-02-08 NOTE — Progress Notes (Signed)
    Procedures performed today:    None.  Independent interpretation of notes and tests performed by another provider:   None.  Brief History, Exam, Impression, and Recommendations:    BP 135/71 (BP Location: Right Arm, Patient Position: Sitting, Cuff Size: Normal)   Pulse 74   Ht 5\' 11"  (1.803 m)   Wt 233 lb 9.6 oz (106 kg)   SpO2 98%   BMI 32.58 kg/m   Neck pain Patient reports that about 2 weeks ago, he was helping family member with moving when he felt his right knee give out and hit his jaw and developed right-sided neck pain.  Pain has persisted, he has found it difficult to look to the right, has impacted sleep at times as well.  Has been using conservative measures thus far to try to help with symptoms.  Denies any radiation of pain, no associated numbness or tingling. On exam, patient is no acute distress, does have moderate tenderness to palpation near right side of base of occiput extending inferior through cervical paraspinal muscles.  Largely normal range of motion at bilateral shoulders, no significant pain at shoulders Suspect cervical paraspinal muscle spasm/strain.  Feel the patient would benefit from evaluation and treatment with physical therapy, patient in agreement, referral placed today.  Advised that PT will likely provide patient with home exercise program to proceed with as well.  Can utilize heat or ice as preferred.  If symptoms not responding as expected, would proceed with imaging as next step  Low testosterone Patient reports being on topical testosterone replacement therapy through his last PCP, applies this nightly, has been using this for a number of years.  Has been out of prescription for about a month or so.  He is unsure of specific dose/prescription that he ends utilizing.  Indicates that he receives testosterone through [facility], [place], [phone].  He is requesting to have refill of this today. Advised patient that we will need  to gather further information regarding testosterone prescription in order to be able to provide refill.  We will reach out to pharmacy to get information on current prescription on file.  Additionally, we will need to monitor labs including testosterone, PSA, intermittent monitoring of cholesterol panel, hematocrit.  Patient voices understanding.  Osteoarthritis of knee At last visit, bilateral steroid injection was completed for both knees.  He did have some initial improvement, however pain returned after about a month or so.  Patient has previously utilized injection therapy, oral medications, topical medications, home exercise program, physical therapy in the past.  Given inadequate response to steroid injection and conservative measures tried thus far, feel would be reasonable to proceed with hyaluronic acid injection.  We will look to obtain authorization for this through insurance company and schedule appointments for injections once authorization obtained.  Return in about 7 months (around 09/10/2023) for CPE with FBW 1 week prior.  We will schedule hyaluronic acid injections once authorization approved   ___________________________________________ Kaniah Rizzolo [name], MD, ABFM, CAQSM Primary Care and Sports Medicine [facility]

## 2023-02-12 DIAGNOSIS — C4442 Squamous cell carcinoma of skin of scalp and neck: Secondary | ICD-10-CM | POA: Diagnosis not present

## 2023-02-13 ENCOUNTER — Telehealth: Payer: Self-pay | Admitting: *Deleted

## 2023-02-13 ENCOUNTER — Telehealth (HOSPITAL_BASED_OUTPATIENT_CLINIC_OR_DEPARTMENT_OTHER): Payer: Self-pay | Admitting: Family Medicine

## 2023-02-13 ENCOUNTER — Other Ambulatory Visit: Payer: Self-pay | Admitting: Oncology

## 2023-02-13 DIAGNOSIS — I824Z1 Acute embolism and thrombosis of unspecified deep veins of right distal lower extremity: Secondary | ICD-10-CM

## 2023-02-13 NOTE — Telephone Encounter (Signed)
Called Mrs. Nawrot to f/u on request for Eliquis refill that was last filled in Feb 2024. Inquired if he was taking it. Wife reports they had several 5 mg tablets and they just broke them in half and took bid.

## 2023-02-13 NOTE — Telephone Encounter (Signed)
pts spouse asking how long it takes for the gel shots will take, him and his wife gayle want to get them before their trip, she asks that she get a return call regarding this

## 2023-02-13 NOTE — Telephone Encounter (Signed)
Lvm to schedule retina screenin 02/22/23

## 2023-02-14 ENCOUNTER — Encounter (HOSPITAL_BASED_OUTPATIENT_CLINIC_OR_DEPARTMENT_OTHER): Payer: Self-pay | Admitting: Family Medicine

## 2023-02-14 DIAGNOSIS — K080 Exfoliation of teeth due to systemic causes: Secondary | ICD-10-CM | POA: Diagnosis not present

## 2023-02-19 MED ORDER — ORTHOVISC 30 MG/2ML IX SOSY
30.0000 mg | PREFILLED_SYRINGE | INTRA_ARTICULAR | 0 refills | Status: DC
Start: 2023-02-19 — End: 2023-02-20
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
## 2023-02-19 NOTE — Assessment & Plan Note (Signed)
At last visit, bilateral steroid injection was completed for both knees.  He did have some initial improvement, however pain returned after about a month or so.  Patient has previously utilized injection therapy, oral medications, topical medications, home exercise program, physical therapy in the past.  Given inadequate response to steroid injection and conservative measures tried thus far, feel would be reasonable to proceed with hyaluronic acid injection.  We will look to obtain authorization for this through insurance company and schedule appointments for injections once authorization obtained.

## 2023-02-19 NOTE — Assessment & Plan Note (Signed)
Patient reports being on topical testosterone replacement therapy through his last PCP, applies this nightly, has been using this for a number of years.  Has been out of prescription for about a month or so.  He is unsure of specific dose/prescription that he ends utilizing.  Indicates that he receives testosterone through [facility], [place], [phone].  He is requesting to have refill of this today. Advised patient that we will need to gather further information regarding testosterone prescription in order to be able to provide refill.  We will reach out to pharmacy to get information on current prescription on file.  Additionally, we will need to monitor labs including testosterone, PSA, intermittent monitoring of cholesterol panel, hematocrit.  Patient voices understanding.

## 2023-02-20 ENCOUNTER — Ambulatory Visit: Payer: Medicare Other | Attending: Family Medicine | Admitting: Physical Therapy

## 2023-02-20 ENCOUNTER — Encounter: Payer: Self-pay | Admitting: Physical Therapy

## 2023-02-20 ENCOUNTER — Other Ambulatory Visit: Payer: Self-pay

## 2023-02-20 ENCOUNTER — Telehealth (HOSPITAL_BASED_OUTPATIENT_CLINIC_OR_DEPARTMENT_OTHER): Payer: Self-pay | Admitting: Family Medicine

## 2023-02-20 DIAGNOSIS — M542 Cervicalgia: Secondary | ICD-10-CM | POA: Diagnosis not present

## 2023-02-20 DIAGNOSIS — R252 Cramp and spasm: Secondary | ICD-10-CM | POA: Diagnosis not present

## 2023-02-20 MED ORDER — EUFLEXXA 20 MG/2ML IX SOSY
20.0000 mg | PREFILLED_SYRINGE | INTRA_ARTICULAR | 0 refills | Status: DC
Start: 2023-02-20 — End: 2023-02-28

## 2023-02-20 NOTE — Telephone Encounter (Signed)
I called [employer] and spoke with Dierdre Harness. To get prior authorization for Euflexxa.  Euflexxa is not covered, but OrthoVisc is. I spoke with Dr. [name] he is good with [place] using OrthoVisc.  [facility].  Once recevied, Toma Copier will call patient to schedule him in the clinic to receive his injections.     Deductible does not apply.  Once the OOP ($3150, met $305.38) has been met, patient is covered at 100%.  Office visit should be billed to the primary medical payer.  There is a $15 office visit co-pay that applies.  Prior authorization is not required.  Confirmed with Step [id number].

## 2023-02-20 NOTE — Addendum Note (Signed)
Addended by: [name], Gavinn Collard J on: 02/20/2023 08:14 AM   Modules accepted: Orders

## 2023-02-21 ENCOUNTER — Encounter (HOSPITAL_BASED_OUTPATIENT_CLINIC_OR_DEPARTMENT_OTHER): Payer: Self-pay | Admitting: Family Medicine

## 2023-02-21 ENCOUNTER — Ambulatory Visit (INDEPENDENT_AMBULATORY_CARE_PROVIDER_SITE_OTHER): Payer: Medicare Other | Admitting: Family Medicine

## 2023-02-21 VITALS — BP 129/81 | HR 73 | Ht 71.0 in | Wt 233.7 lb

## 2023-02-21 DIAGNOSIS — M170 Bilateral primary osteoarthritis of knee: Secondary | ICD-10-CM | POA: Diagnosis not present

## 2023-02-21 NOTE — Progress Notes (Signed)
    Procedures performed today:    Procedure: Injection of bilateral knees, viscosupplementation, OrthoVisc #1 NRC [id number] Verbal informed consent obtained. Time-out conducted. Noted no overlying erythema, induration, or other signs of local infection.  Skin prepped in a sterile fashion. Local anesthesia: Topical Ethyl chloride. With sterile technique and under real time ultrasound guidance: 2 cc of OrthoVisc injected easily Completed without difficulty Advised to call if fevers/chills, erythema, induration, drainage, or persistent bleeding. Impression: Technically successful injection of bilateral knee joints  Independent interpretation of notes and tests performed by another provider:   None.  Brief History, Exam, Impression, and Recommendations:    BP 129/81 (BP Location: Left Arm, Patient Position: Sitting, Cuff Size: Normal)   Pulse 73   Ht 5\' 11"  (1.803 m)   Wt 233 lb 11 oz (106 kg)   SpO2 95%   BMI 32.59 kg/m   Osteoarthritis of both knees, unspecified osteoarthritis type Assessment & Plan: Patient with osteoarthritis of the knee joints bilaterally. Radiographs show evidence of joint space narrowing, osteophytes, subchondral sclerosis and/or subchondral cysts.  This patient has knee pain which interferes with functional and activities of daily living.   This patient has experienced inadequate response, adverse effects and/or intolerance with conservative treatments such as acetaminophen, NSAIDS, topical creams, physical therapy or regular exercise, knee bracing and/or weight loss.  This patient has experienced inadequate response or has a contraindication to intra articular steroid injections for at least 3 months.  Given continued symptoms despite measures including therapy/home exercise program, intra-articular steroid injections, NSAIDs, topical creams, we discussed viscosupplementation as additional consideration, patient interested in this. Today, we completed  bilateral viscosupplementation injections for bilateral knee osteoarthritis.  Procedure note above.  Patient tolerated procedure well. Will schedule for repeat injections for right second and third administrations over the next 2 weeks.  Discussed precautions, advised to return to the office with any concerns.   Return in about 1 week (around 02/28/2023).   ___________________________________________ Beyounce Dickens [name], MD, ABFM, CAQSM Primary Care [facility]
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
## 2023-02-22 ENCOUNTER — Ambulatory Visit: Payer: Medicare Other | Admitting: Physical Therapy

## 2023-02-22 ENCOUNTER — Encounter: Payer: Self-pay | Admitting: Physical Therapy

## 2023-02-22 DIAGNOSIS — M542 Cervicalgia: Secondary | ICD-10-CM

## 2023-02-22 DIAGNOSIS — R252 Cramp and spasm: Secondary | ICD-10-CM | POA: Diagnosis not present

## 2023-02-22 NOTE — Assessment & Plan Note (Signed)
Patient with osteoarthritis of the knee joints bilaterally. Radiographs show evidence of joint space narrowing, osteophytes, subchondral sclerosis and/or subchondral cysts.  This patient has knee pain which interferes with functional and activities of daily living.   This patient has experienced inadequate response, adverse effects and/or intolerance with conservative treatments such as acetaminophen, NSAIDS, topical creams, physical therapy or regular exercise, knee bracing and/or weight loss.  This patient has experienced inadequate response or has a contraindication to intra articular steroid injections for at least 3 months.  Given continued symptoms despite measures including therapy/home exercise program, intra-articular steroid injections, NSAIDs, topical creams, we discussed viscosupplementation as additional consideration, patient interested in this. Today, we completed bilateral viscosupplementation injections for bilateral knee osteoarthritis.  Procedure note above.  Patient tolerated procedure well. Will schedule for repeat injections for right second and third administrations over the next 2 weeks.  Discussed precautions, advised to return to the office with any concerns. 

## 2023-02-28 ENCOUNTER — Encounter (HOSPITAL_BASED_OUTPATIENT_CLINIC_OR_DEPARTMENT_OTHER): Payer: Self-pay | Admitting: Family Medicine

## 2023-02-28 ENCOUNTER — Ambulatory Visit (INDEPENDENT_AMBULATORY_CARE_PROVIDER_SITE_OTHER): Payer: Medicare Other | Admitting: Family Medicine

## 2023-02-28 VITALS — BP 123/68 | HR 82 | Ht 71.0 in | Wt 233.7 lb

## 2023-02-28 DIAGNOSIS — R7989 Other specified abnormal findings of blood chemistry: Secondary | ICD-10-CM | POA: Diagnosis not present

## 2023-02-28 DIAGNOSIS — M170 Bilateral primary osteoarthritis of knee: Secondary | ICD-10-CM

## 2023-02-28 MED ORDER — TESTOSTERONE CYPIONATE 100 MG/ML IM SOLN
50.0000 mg | INTRAMUSCULAR | 0 refills | Status: DC
Start: 2023-02-28 — End: 2023-05-17

## 2023-02-28 MED ORDER — SYRINGE 18G X 1-1/2" 3 ML MISC
1.0000 | 1 refills | Status: DC
Start: 2023-02-28 — End: 2023-10-22

## 2023-02-28 MED ORDER — NEEDLE (DISP) 22G X 1-1/2" MISC
1.0000 | 1 refills | Status: AC
Start: 2023-02-28 — End: ?

## 2023-02-28 MED ORDER — ULTICARE ALCOHOL SWABS 70 % PADS: 1.0000 | MEDICATED_PAD | 1 refills | Status: DC

## 2023-02-28 NOTE — Assessment & Plan Note (Signed)
Patient presents today for viscosupplementation injection 2nd. Patient tolerated previous injection well. No acute concerns today. Would like to continue with viscosupplementation series today. Procedure note above. Next injection in one week. 

## 2023-02-28 NOTE — Assessment & Plan Note (Signed)
As discussed previously with patient, history of low testosterone which was being managed by his last PCP.  This was being done with topical testosterone.  Patient is interested in continuing with testosterone replacement therapy.  We discussed options pertaining to available formulations including topical injectable options. After discussion, patient elected to proceed with injectable formulation.  We did review potential risk, benefits, side effects.  Discussed proper administration.  We will plan to proceed with laboratory evaluation/follow-up At next visit, check PSA for baseline.  In about 2 to 3 months after starting testosterone therapy, we will plan to check testosterone level, lipid panel, CBC, PSA for monitoring.
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
## 2023-02-28 NOTE — Progress Notes (Signed)
    Procedures performed today:    Procedure: Injection of bilateral knees, viscosupplementation, OrthoVisc #2 NRC [id number] Verbal informed consent obtained. Time-out conducted. Noted no overlying erythema, induration, or other signs of local infection.  Skin prepped in a sterile fashion. Local anesthesia: Topical Ethyl chloride. With sterile technique and under real time ultrasound guidance: 2 cc of OrthoVisc injected easily Completed without difficulty Advised to call if fevers/chills, erythema, induration, drainage, or persistent bleeding. Impression: Technically successful injection of bilateral knee joints  Independent interpretation of notes and tests performed by another provider:   None.  Brief History, Exam, Impression, and Recommendations:    BP 123/68 (BP Location: Right Arm, Patient Position: Sitting, Cuff Size: Normal)   Pulse 82   Ht 5\' 11"  (1.803 m)   Wt 233 lb 11 oz (106 kg)   SpO2 97%   BMI 32.59 kg/m   Low testosterone Assessment & Plan: As discussed previously with patient, history of low testosterone which was being managed by his last PCP.  This was being done with topical testosterone.  Patient is interested in continuing with testosterone replacement therapy.  We discussed options pertaining to available formulations including topical injectable options. After discussion, patient elected to proceed with injectable formulation.  We did review potential risk, benefits, side effects.  Discussed proper administration.  We will plan to proceed with laboratory evaluation/follow-up At next visit, check PSA for baseline.  In about 2 to 3 months after starting testosterone therapy, we will plan to check testosterone level, lipid panel, CBC, PSA for monitoring.  Orders: -     Testosterone Cypionate; Inject 0.5 mLs (50 mg total) into the muscle once a week. For IM use only  Dispense: 10 mL; Refill: 0 -     Syringe; 1 each by Does not apply route once a week. Use to  draw up medication.  Dispense: 50 each; Refill: 1 -     Needle (Disp); 1 each by Does not apply route once a week. Use to administer medication.  Dispense: 50 each; Refill: 1  Osteoarthritis of both knees, unspecified osteoarthritis type Assessment & Plan: Patient presents today for viscosupplementation injection 2nd. Patient tolerated previous injection well. No acute concerns today. Would like to continue with viscosupplementation series today. Procedure note above. Next injection in one week.   Other orders -     UltiCare Alcohol Swabs; 1 each by Does not apply route once a week.  Dispense: 100 each; Refill: 1  Return in about 1 week (around 03/07/2023).   ___________________________________________ Danzig Macgregor [name], MD, ABFM, CAQSM Primary Care and Sports Medicine [facility]

## 2023-03-01 ENCOUNTER — Encounter: Payer: Self-pay | Admitting: Physical Therapy

## 2023-03-01 ENCOUNTER — Ambulatory Visit: Payer: Medicare Other | Admitting: Physical Therapy

## 2023-03-01 DIAGNOSIS — M542 Cervicalgia: Secondary | ICD-10-CM | POA: Diagnosis not present

## 2023-03-01 DIAGNOSIS — R252 Cramp and spasm: Secondary | ICD-10-CM | POA: Diagnosis not present

## 2023-03-06 ENCOUNTER — Telehealth (HOSPITAL_BASED_OUTPATIENT_CLINIC_OR_DEPARTMENT_OTHER): Payer: Self-pay | Admitting: Family Medicine

## 2023-03-06 NOTE — Telephone Encounter (Signed)
Pt needs Testosterone called in---the needles are there, the medication is not

## 2023-03-07 ENCOUNTER — Encounter (HOSPITAL_BASED_OUTPATIENT_CLINIC_OR_DEPARTMENT_OTHER): Payer: Self-pay | Admitting: Family Medicine

## 2023-03-07 ENCOUNTER — Ambulatory Visit (INDEPENDENT_AMBULATORY_CARE_PROVIDER_SITE_OTHER): Payer: Medicare Other | Admitting: Family Medicine

## 2023-03-07 VITALS — BP 118/53 | HR 77 | Ht 71.0 in | Wt 233.0 lb

## 2023-03-07 DIAGNOSIS — M170 Bilateral primary osteoarthritis of knee: Secondary | ICD-10-CM | POA: Diagnosis not present

## 2023-03-07 NOTE — Progress Notes (Signed)
    Procedures performed today:    Procedure: Injection of bilateral knees, viscosupplementation, OrthoVisc #3 NRC [id number] Verbal informed consent obtained. Time-out conducted. Noted no overlying erythema, induration, or other signs of local infection.  Skin prepped in a sterile fashion. Local anesthesia: Topical Ethyl chloride. With sterile technique: 2 cc of OrthoVisc injected easily. Repeated for contralateral knee Completed without difficulty Advised to call if fevers/chills, erythema, induration, drainage, or persistent bleeding. Impression: Technically successful injection of bilateral knee joints.  Independent interpretation of notes and tests performed by another provider:   None.  Brief History, Exam, Impression, and Recommendations:    BP (!) 118/53 (BP Location: Right Arm, Patient Position: Sitting, Cuff Size: Large)   Pulse 77   Ht 5\' 11"  (1.803 m)   Wt 233 lb (105.7 kg)   SpO2 100%   BMI 32.50 kg/m   Osteoarthritis of both knees, unspecified osteoarthritis type Assessment & Plan: Patient presents today for viscosupplementation injection 3rd. Patient tolerated previous injection well. Has some pain in both knees currently. No acute concerns today. Would like to continue with viscosupplementation series today. Procedure note above. Plan to follow-up at previously scheduled OV   Return if symptoms worsen or fail to improve, for already scheduled.   ___________________________________________ Kairi Harshbarger [name], MD, ABFM, CAQSM Primary Care and Sports Medicine [facility]

## 2023-03-07 NOTE — Assessment & Plan Note (Addendum)
Patient presents today for viscosupplementation injection 3rd. Patient tolerated previous injection well. Has some pain in both knees currently. No acute concerns today. Would like to continue with viscosupplementation series today. Procedure note above. Plan to follow-up at previously scheduled OV

## 2023-03-07 NOTE — Addendum Note (Signed)
Addended by: [name], Tequilla Cousineau J on: 03/07/2023 03:28 PM   Modules accepted: Level of Service
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
## 2023-03-13 ENCOUNTER — Ambulatory Visit: Payer: Medicare Other | Attending: Family Medicine | Admitting: Physical Therapy

## 2023-03-13 ENCOUNTER — Encounter: Payer: Self-pay | Admitting: Physical Therapy

## 2023-03-13 ENCOUNTER — Other Ambulatory Visit (HOSPITAL_BASED_OUTPATIENT_CLINIC_OR_DEPARTMENT_OTHER): Payer: Self-pay | Admitting: Family Medicine

## 2023-03-13 DIAGNOSIS — M542 Cervicalgia: Secondary | ICD-10-CM | POA: Diagnosis not present

## 2023-03-13 DIAGNOSIS — R252 Cramp and spasm: Secondary | ICD-10-CM | POA: Insufficient documentation

## 2023-03-13 DIAGNOSIS — E119 Type 2 diabetes mellitus without complications: Secondary | ICD-10-CM

## 2023-03-14 ENCOUNTER — Telehealth (HOSPITAL_BASED_OUTPATIENT_CLINIC_OR_DEPARTMENT_OTHER): Payer: Self-pay | Admitting: Family Medicine

## 2023-03-14 NOTE — Telephone Encounter (Signed)
Reached out to the pharmacy in regards to the trimex. Unfortunately this medication is not on his current medication list prescribed by Dr. [name] so we will need to wait until he returns back on Monday to look into this matter.

## 2023-03-14 NOTE — Telephone Encounter (Signed)
following up on trimex 30-2-20 refill request 5ml

## 2023-03-15 ENCOUNTER — Ambulatory Visit: Payer: Medicare Other | Admitting: Physical Therapy

## 2023-03-20 ENCOUNTER — Telehealth (HOSPITAL_BASED_OUTPATIENT_CLINIC_OR_DEPARTMENT_OTHER): Payer: Self-pay | Admitting: Family Medicine

## 2023-03-20 ENCOUNTER — Other Ambulatory Visit (HOSPITAL_BASED_OUTPATIENT_CLINIC_OR_DEPARTMENT_OTHER): Payer: Self-pay

## 2023-03-20 ENCOUNTER — Encounter: Payer: Medicare Other | Admitting: Physical Therapy

## 2023-03-20 DIAGNOSIS — R7989 Other specified abnormal findings of blood chemistry: Secondary | ICD-10-CM

## 2023-03-20 MED ORDER — METOPROLOL TARTRATE 25 MG PO TABS: ORAL_TABLET | ORAL | 3 refills | Status: DC

## 2023-03-20 NOTE — Telephone Encounter (Signed)
Pt refill was sent to the pharmacy and patient has appointment on tomorrow on 7/17 to discuss other medications.

## 2023-03-20 NOTE — Telephone Encounter (Signed)
metoprolol tartrate (LOPRESSOR) 25 MG tablet [[id number]]   Pick up at [facility]   Also trimex to the [employer]

## 2023-03-21 ENCOUNTER — Ambulatory Visit (INDEPENDENT_AMBULATORY_CARE_PROVIDER_SITE_OTHER): Payer: Medicare Other | Admitting: Family Medicine

## 2023-03-21 ENCOUNTER — Encounter (HOSPITAL_BASED_OUTPATIENT_CLINIC_OR_DEPARTMENT_OTHER): Payer: Self-pay | Admitting: Family Medicine

## 2023-03-21 VITALS — BP 133/60 | HR 65 | Temp 97.7°F | Ht 71.0 in | Wt 230.0 lb

## 2023-03-21 DIAGNOSIS — N529 Male erectile dysfunction, unspecified: Secondary | ICD-10-CM

## 2023-03-21 DIAGNOSIS — M79673 Pain in unspecified foot: Secondary | ICD-10-CM | POA: Insufficient documentation

## 2023-03-21 DIAGNOSIS — M79671 Pain in right foot: Secondary | ICD-10-CM

## 2023-03-21 DIAGNOSIS — M170 Bilateral primary osteoarthritis of knee: Secondary | ICD-10-CM | POA: Diagnosis not present

## 2023-03-21 NOTE — Assessment & Plan Note (Signed)
As discussed above, patient has had some associated right foot pain recently.  This is currently improved. We discussed potential causes for foot pain.  May have had slight injury or change in gait which led to pain.  Additional considerations include possible vascular abnormality which could be impairing blood flow to distal lower extremity.  May also be dealing with component of neuropathy given longstanding diabetes. Discussed considerations for further evaluation/management.  We can proceed with referral to vascular surgeon for evaluation to be arranged at patient preference.  Pending results of this evaluation, consider referral to podiatrist

## 2023-03-21 NOTE — Progress Notes (Unsigned)
Procedures performed today:    None.  Independent interpretation of notes and tests performed by another provider:   None.  Brief History, Exam, Impression, and Recommendations:    BP 133/60 (BP Location: Right Arm, Patient Position: Sitting, Cuff Size: Normal)   Pulse 65   Temp 97.7 F (36.5 C)   Ht 5\' 11"  (1.803 m)   Wt 230 lb (104.3 kg)   SpO2 98%   BMI 32.08 kg/m   Osteoarthritis of both knees, unspecified osteoarthritis type Assessment & Plan: Patient reports having ongoing right knee pain.  Was worse a few days ago, has been improved over the last couple days.  Left knee has been doing well after recent injections.  He notes that pain was extending proximally in the right leg as well as distally to the foot.  Also noted some swelling around the right ankle.  He does have ongoing redness of right foot.  He also reports experiencing some numbness through right foot extending from toes proximally to ankle.  This has currently resolved.  He has been using off-the-shelf knee brace/sleeve which has provided some improvement in symptoms for him.  He primarily has questions about any other measures which can be tried to help with controlling symptoms given they have an upcoming trip. On exam, patient in no acute distress.  Right knee with some tenderness to palpation across anterior joint line, no significant swelling or effusion present.  No surrounding erythema.  There is some increased redness through the distal lower extremity extending through ankle with slight purple discoloration in the distal aspect of foot.  Borderline cap refill of about 2 seconds.  Sensation intact today.  Normal PT pulse, DP pulse difficult to appreciate. We reviewed considerations for management options.  He can utilize over-the-counter medications to help with pain control.  Use of knee brace or sleeve can be considered to aid in comfort as well.  We also discussed consideration for medial unloader knee  brace which we could look to obtain through insurance or patient can look for over-the-counter/self-pay options.  Provided further information related to this.  Would not do steroid injection at this time given recent viscosupplementation injections.   Erectile dysfunction, unspecified erectile dysfunction type Assessment & Plan: Patient reports history of rectal pain.  Has tried oral medication in the past, cannot recall.  Viagra or Cialis.  Did not have success with for medication.  He was subsequently started on penile injection to help with erectile dysfunction and has done well with this.  Today, he is requesting refill of his medication, reports that the medication he was utilizing is a Trimix which was being prepared at [facility] for him. We can look to provide refill of medication for patient.  We will need to reach out to the pharmacy to obtain specific details regarding compounded medication the patient was utilizing.  This medication does have some risk of developing plaque within the penis.  Cautioned on use.   Right foot pain Assessment & Plan: As discussed above, patient has had some associated right foot pain recently.  This is currently improved. We discussed potential causes for foot pain.  May have had slight injury or change in gait which led to pain.  Additional considerations include possible vascular abnormality which could be impairing blood flow to distal lower extremity.  May also be dealing with component of neuropathy given longstanding diabetes. Discussed considerations for further evaluation/management.  We can proceed with referral to vascular surgeon for  evaluation to be arranged at patient preference.  Pending results of this evaluation, consider referral to podiatrist   Return if symptoms worsen or fail to improve.   ___________________________________________ Kirk Kartes [name], MD, ABFM, CAQSM Primary Care and Sports Medicine [facility]

## 2023-03-21 NOTE — Assessment & Plan Note (Signed)
Patient reports having ongoing right knee pain.  Was worse a few days ago, has been improved over the last couple days.  Left knee has been doing well after recent injections.  He notes that pain was extending proximally in the right leg as well as distally to the foot.  Also noted some swelling around the right ankle.  He does have ongoing redness of right foot.  He also reports experiencing some numbness through right foot extending from toes proximally to ankle.  This has currently resolved.  He has been using off-the-shelf knee brace/sleeve which has provided some improvement in symptoms for him.  He primarily has questions about any other measures which can be tried to help with controlling symptoms given they have an upcoming trip. On exam, patient in no acute distress.  Right knee with some tenderness to palpation across anterior joint line, no significant swelling or effusion present.  No surrounding erythema.  There is some increased redness through the distal lower extremity extending through ankle with slight purple discoloration in the distal aspect of foot.  Borderline cap refill of about 2 seconds.  Sensation intact today.  Normal PT pulse, DP pulse difficult to appreciate. We reviewed considerations for management options.  He can utilize over-the-counter medications to help with pain control.  Use of knee brace or sleeve can be considered to aid in comfort as well.  We also discussed consideration for medial unloader knee brace which we could look to obtain through insurance or patient can look for over-the-counter/self-pay options.  Provided further information related to this.  Would not do steroid injection at this time given recent viscosupplementation injections.

## 2023-03-21 NOTE — Assessment & Plan Note (Signed)
Patient reports history of rectal pain.  Has tried oral medication in the past, cannot recall.  Viagra or Cialis.  Did not have success with for medication.  He was subsequently started on penile injection to help with erectile dysfunction and has done well with this.  Today, he is requesting refill of his medication, reports that the medication he was utilizing is a Trimix which was being prepared at [facility] for him. We can look to provide refill of medication for patient.  We will need to reach out to the pharmacy to obtain specific details regarding compounded medication the patient was utilizing.  This medication does have some risk of developing plaque within the penis.  Cautioned on use.

## 2023-03-22 ENCOUNTER — Encounter: Payer: Medicare Other | Admitting: Physical Therapy

## 2023-03-23 NOTE — Telephone Encounter (Signed)
[facility] is calling they have not received the information for the medication --please call  [phone],,fax [phone]

## 2023-03-23 NOTE — Telephone Encounter (Signed)
Dr. [name] has prescription sent over from pt previous provider. He has to prescribe it now. Waiting on provider.

## 2023-03-28 ENCOUNTER — Telehealth (HOSPITAL_BASED_OUTPATIENT_CLINIC_OR_DEPARTMENT_OTHER): Payer: Self-pay | Admitting: Family Medicine

## 2023-03-28 DIAGNOSIS — M79671 Pain in right foot: Secondary | ICD-10-CM

## 2023-03-28 NOTE — Telephone Encounter (Signed)
[facility] is calling they have not received the information for the medication --please call  [phone],,fax [phone]- Trimex, pt had appt 7/17 and waiting for Dr Tommi Rumps Peru to order medication

## 2023-03-28 NOTE — Telephone Encounter (Signed)
Pt is wanting a referral to [facility]   Please call Dondra Spry back and advise

## 2023-03-29 MED ORDER — AMBULATORY NON FORMULARY MEDICATION: 1.0000 mL | Freq: Once | 2 refills | Status: DC | PRN

## 2023-03-29 NOTE — Telephone Encounter (Signed)
Pt is calling to get an update

## 2023-03-30 NOTE — Telephone Encounter (Signed)
Referrals have been placed.  

## 2023-03-30 NOTE — Addendum Note (Signed)
Addended by: [name], Fisher Hargadon J on: 03/30/2023 08:12 AM   Modules accepted: Orders

## 2023-04-02 ENCOUNTER — Other Ambulatory Visit (HOSPITAL_BASED_OUTPATIENT_CLINIC_OR_DEPARTMENT_OTHER): Payer: Self-pay

## 2023-04-02 DIAGNOSIS — Z48817 Encounter for surgical aftercare following surgery on the skin and subcutaneous tissue: Secondary | ICD-10-CM | POA: Diagnosis not present

## 2023-04-02 DIAGNOSIS — R7989 Other specified abnormal findings of blood chemistry: Secondary | ICD-10-CM

## 2023-04-02 DIAGNOSIS — N529 Male erectile dysfunction, unspecified: Secondary | ICD-10-CM

## 2023-04-02 MED ORDER — AMBULATORY NON FORMULARY MEDICATION
1.0000 mL | Freq: Once | 2 refills | Status: DC | PRN
Start: 2023-04-02 — End: 2023-11-06

## 2023-04-02 NOTE — Telephone Encounter (Signed)
Prescription was printed off and signed by Dr. [name]. I have faxed over prescription to [facility].

## 2023-04-04 ENCOUNTER — Other Ambulatory Visit: Payer: Self-pay | Admitting: *Deleted

## 2023-04-04 DIAGNOSIS — M79671 Pain in right foot: Secondary | ICD-10-CM

## 2023-04-05 ENCOUNTER — Ambulatory Visit (HOSPITAL_COMMUNITY)
Admission: RE | Admit: 2023-04-05 | Discharge: 2023-04-05 | Disposition: A | Discharge status: Home / Self Care | Payer: Medicare Other | Source: Ambulatory Visit | Attending: Vascular Surgery | Admitting: Vascular Surgery

## 2023-04-05 DIAGNOSIS — M79671 Pain in right foot: Secondary | ICD-10-CM | POA: Diagnosis not present

## 2023-04-05 LAB — VAS US ABI WITH/WO TBI
Left ABI: 1.09
Right ABI: 1.17

## 2023-04-06 ENCOUNTER — Ambulatory Visit (INDEPENDENT_AMBULATORY_CARE_PROVIDER_SITE_OTHER): Payer: Medicare Other | Admitting: Podiatry

## 2023-04-06 DIAGNOSIS — E119 Type 2 diabetes mellitus without complications: Secondary | ICD-10-CM

## 2023-04-06 NOTE — Progress Notes (Signed)
This patient presents to the office for diabetic foot exam. This patient presents to the office with his wife [name]. This patient says there is no pain or discomfort in her feet.  No history of infection or drainage.  This patient presents to the office for foot exam due to having a history of diabetes.  Vascular  Dorsalis pedis and posterior tibial pulses are  weakly palpable  B/L.  Capillary return  WNL.  Temperature gradient is  WNL.  Skin turgor  WNL  Sensorium  Senn Weinstein monofilament wire diminished . Normal tactile sensation.  Nail Exam  Patient has normal nails with no evidence of bacterial or fungal infection.  Orthopedic  Exam  Muscle tone and muscle strength  WNL.  No limitations of motion feet  B/L.  No crepitus or joint effusion noted.  Foot type is unremarkable and digits show no abnormalities.  Bony prominences are unremarkable.  Skin  No open lesions.  Normal skin texture and turgor.   Diabetes with no complications  Diabetic foot exam was performed.  There is evidence of vascular or neurologic pathology.  RTC  3 months   Patient says he is receiving care for his circulation.   Helane Gunther DPM

## 2023-04-12 ENCOUNTER — Other Ambulatory Visit (HOSPITAL_BASED_OUTPATIENT_CLINIC_OR_DEPARTMENT_OTHER): Payer: Self-pay | Admitting: Family Medicine

## 2023-04-12 DIAGNOSIS — E119 Type 2 diabetes mellitus without complications: Secondary | ICD-10-CM

## 2023-04-13 ENCOUNTER — Ambulatory Visit: Payer: Medicare Other | Admitting: Oncology

## 2023-04-25 ENCOUNTER — Ambulatory Visit (INDEPENDENT_AMBULATORY_CARE_PROVIDER_SITE_OTHER): Payer: Medicare Other | Admitting: Family Medicine

## 2023-04-25 ENCOUNTER — Encounter (HOSPITAL_BASED_OUTPATIENT_CLINIC_OR_DEPARTMENT_OTHER): Payer: Self-pay | Admitting: Family Medicine

## 2023-04-25 VITALS — BP 136/78 | HR 79 | Ht 71.0 in | Wt 226.0 lb

## 2023-04-25 DIAGNOSIS — M170 Bilateral primary osteoarthritis of knee: Secondary | ICD-10-CM | POA: Diagnosis not present

## 2023-04-25 DIAGNOSIS — Z7984 Long term (current) use of oral hypoglycemic drugs: Secondary | ICD-10-CM

## 2023-04-25 DIAGNOSIS — L821 Other seborrheic keratosis: Secondary | ICD-10-CM | POA: Diagnosis not present

## 2023-04-25 DIAGNOSIS — L570 Actinic keratosis: Secondary | ICD-10-CM | POA: Diagnosis not present

## 2023-04-25 DIAGNOSIS — L218 Other seborrheic dermatitis: Secondary | ICD-10-CM | POA: Diagnosis not present

## 2023-04-25 DIAGNOSIS — N1831 Chronic kidney disease, stage 3a: Secondary | ICD-10-CM | POA: Diagnosis not present

## 2023-04-25 DIAGNOSIS — D225 Melanocytic nevi of trunk: Secondary | ICD-10-CM | POA: Diagnosis not present

## 2023-04-25 DIAGNOSIS — E1142 Type 2 diabetes mellitus with diabetic polyneuropathy: Secondary | ICD-10-CM

## 2023-04-25 DIAGNOSIS — L814 Other melanin hyperpigmentation: Secondary | ICD-10-CM | POA: Diagnosis not present

## 2023-04-25 LAB — COMPREHENSIVE METABOLIC PANEL
ALT: 55 IU/L — ABNORMAL HIGH (ref 0–44)
AST: 51 IU/L — ABNORMAL HIGH (ref 0–40)
Albumin: 4.6 g/dL (ref 3.8–4.8)
Alkaline Phosphatase: 92 IU/L (ref 44–121)
BUN/Creatinine Ratio: 11 (ref 10–24)
BUN: 14 mg/dL (ref 8–27)
Bilirubin Total: 0.4 mg/dL (ref 0.0–1.2)
CO2: 24 mmol/L (ref 20–29)
Calcium: 10 mg/dL (ref 8.6–10.2)
Chloride: 99 mmol/L (ref 96–106)
Creatinine, Ser: 1.27 mg/dL (ref 0.76–1.27)
Globulin, Total: 2.8 g/dL (ref 1.5–4.5)
Glucose: 125 mg/dL — ABNORMAL HIGH (ref 70–99)
Potassium: 4.3 mmol/L (ref 3.5–5.2)
Sodium: 141 mmol/L (ref 134–144)
Total Protein: 7.4 g/dL (ref 6.0–8.5)
eGFR: 59 mL/min/{1.73_m2} — ABNORMAL LOW (ref >59)

## 2023-04-25 LAB — CBC WITH DIFFERENTIAL/PLATELET
Basophils Absolute: 0.1 10*3/uL (ref 0.0–0.2)
Basos: 1 %
EOS (ABSOLUTE): 0.1 10*3/uL (ref 0.0–0.4)
Eos: 2 %
Hematocrit: 51.4 % — ABNORMAL HIGH (ref 37.5–51.0)
Hemoglobin: 17 g/dL (ref 13.0–17.7)
Immature Grans (Abs): 0.1 10*3/uL (ref 0.0–0.1)
Immature Granulocytes: 2 %
Lymphocytes Absolute: 1 10*3/uL (ref 0.7–3.1)
Lymphs: 17 %
MCH: 31.1 pg (ref 26.6–33.0)
MCHC: 33.1 g/dL (ref 31.5–35.7)
MCV: 94 fL (ref 79–97)
Monocytes Absolute: 0.6 10*3/uL (ref 0.1–0.9)
Monocytes: 11 %
Neutrophils Absolute: 3.9 10*3/uL (ref 1.4–7.0)
Neutrophils: 67 %
Platelets: 203 10*3/uL (ref 150–450)
RBC: 5.47 x10E6/uL (ref 4.14–5.80)
RDW: 13.7 % (ref 11.6–15.4)
WBC: 5.9 10*3/uL (ref 3.4–10.8)

## 2023-04-25 LAB — HEMOGLOBIN A1C
Est. average glucose Bld gHb Est-mCnc: 143 mg/dL
Hgb A1c MFr Bld: 6.6 % — ABNORMAL HIGH (ref 4.8–5.6)

## 2023-04-25 MED ORDER — OZEMPIC (0.25 OR 0.5 MG/DOSE) 2 MG/3ML ~~LOC~~ SOPN: 0.2500 mg | PEN_INJECTOR | SUBCUTANEOUS | 0 refills | Status: DC

## 2023-04-25 NOTE — Assessment & Plan Note (Signed)
As per above.  We will proceed with referral to orthopedic surgeon for further evaluation and recommendations

## 2023-04-25 NOTE — Assessment & Plan Note (Signed)
Due for recheck of hemoglobin A1c, we will plan to check this today for monitoring Can continue with current medication notable change in A1c present, may need adjustment in pharmacotherapy regimen

## 2023-04-25 NOTE — Progress Notes (Signed)
Procedures performed today:    None.  Independent interpretation of notes and tests performed by another provider:   None.  Brief History, Exam, Impression, and Recommendations:    BP 136/78   Pulse 79   Ht 5\' 11"  (1.803 m)   Wt 226 lb (102.5 kg)   SpO2 97%   BMI 31.52 kg/m   Patient reports that he has generally been unwell over the past several weeks.  We completed series of viscosupplementation injections into bilateral knees.  He unfortunately did not have expected response to injections.  He reports that he has had pain primarily through about middle upper leg extending distally.  Has had associated knee pain, occasionally feeling as though they may give out.  Has had some times where knees have given out and he has bumped into things such as door frames.  He also had more recent issue during trip to the beach where he had more notable myalgias diffusely as opposed to just the lower extremities.  He also reports having some fevers at that time as well.  He primarily attributes this to the Visco injections and feeling that he had some reaction to them.  He did not have any notable knee swelling in either knee. On exam, patient is in no acute distress, vital signs stable.  Knees without any significant erythema or swelling in office today.  No appreciable lower extremity swelling currently. Reviewed symptoms with patient, feel that symptoms are less likely related to the Visco injections specifically.  Certainly, it can be seen where Visco injections do not provide expected relief of symptoms, would not expect systemic issues with these injections.  He has not had any specific localized knee joint signs suggesting local reaction to viscosupplementation.  He was seen about 2 weeks after his last Visco injection and did not have any obvious knee abnormalities then such as notable swelling or erythema.  I also wonder if his recent issue of generally feeling unwell, myalgias, fevers may have  been related to an acute viral illness which is gradually resolving at this point.  He has been feeling better today compared to the last couple days.  I discussed my concerns for other potential causes for symptoms outside of his osteoarthritis or recent Visco injections.  As such, we will proceed with laboratory evaluation today for further evaluation.  We can also proceed with referral to orthopedic surgeon to discuss further treatment options.  Discussed that we would typically avoid steroid injection this time as it has only been 6 weeks since last Visco injection.  Osteoarthritis of both knees, unspecified osteoarthritis type Assessment & Plan: As per above.  We will proceed with referral to orthopedic surgeon for further evaluation and recommendations  Orders: -     Ambulatory referral to Orthopedic Surgery  Chronic kidney disease, stage 3a [facility]) Assessment & Plan: We will check labs today as below  Orders: -     CBC with Differential/Platelet -     Comprehensive metabolic panel  Type 2 diabetes mellitus with diabetic polyneuropathy, without long-term current use of insulin (HCC) Assessment & Plan: Due for recheck of hemoglobin A1c, we will plan to check this today for monitoring Can continue with current medication notable change in A1c present, may need adjustment in pharmacotherapy regimen  Orders: -     Ozempic (0.25 or 0.5 MG/DOSE); Inject 0.25 mg into the skin once a week.  Dispense: 3 mL; Refill: 0 -     CBC with Differential/Platelet -  Comprehensive metabolic panel -     Hemoglobin A1c  No follow-ups on file.   ___________________________________________ Ollen Rao [name], MD, ABFM, CAQSM Primary Care and Sports Medicine [facility]

## 2023-04-25 NOTE — Assessment & Plan Note (Signed)
We will check labs today as below

## 2023-04-26 ENCOUNTER — Inpatient Hospital Stay: Payer: Medicare Other | Attending: Oncology | Admitting: Oncology

## 2023-04-26 ENCOUNTER — Other Ambulatory Visit (HOSPITAL_BASED_OUTPATIENT_CLINIC_OR_DEPARTMENT_OTHER): Payer: Self-pay | Admitting: Family Medicine

## 2023-04-26 VITALS — BP 134/73 | HR 73 | Temp 98.2°F | Resp 18 | Ht 71.0 in | Wt 227.1 lb

## 2023-04-26 DIAGNOSIS — Z86718 Personal history of other venous thrombosis and embolism: Secondary | ICD-10-CM | POA: Diagnosis not present

## 2023-04-26 DIAGNOSIS — Z86711 Personal history of pulmonary embolism: Secondary | ICD-10-CM | POA: Insufficient documentation

## 2023-04-26 DIAGNOSIS — Z85048 Personal history of other malignant neoplasm of rectum, rectosigmoid junction, and anus: Secondary | ICD-10-CM | POA: Insufficient documentation

## 2023-04-26 DIAGNOSIS — R7401 Elevation of levels of liver transaminase levels: Secondary | ICD-10-CM

## 2023-04-26 DIAGNOSIS — Z923 Personal history of irradiation: Secondary | ICD-10-CM | POA: Insufficient documentation

## 2023-04-26 DIAGNOSIS — C211 Malignant neoplasm of anal canal: Secondary | ICD-10-CM | POA: Diagnosis not present

## 2023-04-26 DIAGNOSIS — Z9221 Personal history of antineoplastic chemotherapy: Secondary | ICD-10-CM | POA: Insufficient documentation

## 2023-04-26 NOTE — Progress Notes (Signed)
[facility] OFFICE PROGRESS NOTE   Diagnosis: Anal cancer, DVT/PE  INTERVAL HISTORY:   Kirk [name] returns as scheduled.  He continues apixaban anticoagulation.  No bleeding or symptom of recurrent thrombosis.  No evidence of recurrent tumor at the anus.  He underwent a colonoscopy by Dr. Adela Lank 01/01/2023.  Multiple polyps were removed.  The pathology revealed tubular adenomas. He reports bilateral knee pain.  The pain has not improved with injection therapy.  He plans to see orthopedics.  He recently had a skin cancer removed from the parietal scalp. Objective:  Vital signs in last 24 hours:  Blood pressure 134/73, pulse 73, temperature 98.2 F (36.8 C), temperature source Oral, resp. rate 18, height 5\' 11"  (1.803 m), weight 227 lb 1.6 oz (103 kg), SpO2 98%.     Lymphatics: No cervical, supraclavicular, axillary, or inguinal nodes Resp: Lungs clear bilaterally Cardio: Regular rate and rhythm GI: No hepatosplenomegaly, no mass, nontender Vascular: No leg edema  Skin: Scar at the parietal scalp without evidence of recurrent tumor   Lab Results:  Lab Results  Component Value Date   WBC 5.9 04/25/2023   HGB 17.0 04/25/2023   HCT 51.4 (H) 04/25/2023   MCV 94 04/25/2023   PLT 203 04/25/2023   NEUTROABS 3.9 04/25/2023    CMP  Lab Results  Component Value Date   NA 141 04/25/2023   K 4.3 04/25/2023   CL 99 04/25/2023   CO2 24 04/25/2023   GLUCOSE 125 (H) 04/25/2023   BUN 14 04/25/2023   CREATININE 1.27 04/25/2023   CALCIUM 10.0 04/25/2023   PROT 7.4 04/25/2023   ALBUMIN 4.6 04/25/2023   AST 51 (H) 04/25/2023   ALT 55 (H) 04/25/2023   ALKPHOS 92 04/25/2023   BILITOT 0.4 04/25/2023   GFRNONAA >60 04/27/2022   GFRAA >60 01/08/2018     Medications: I have reviewed the patient's current medications.   Assessment/Plan: Squamous cell carcinoma of the anal canal, HPV positive.   - Staging CT/PET scan with tiny lung nodules, hypermetabolic  retroperitoneal, right inguinal, and left supraclavicular nodes . FNA biopsy of a left supraclavicular lymph node on 05/03/2012 confirmed metastatic squamous cell carcinoma.   -Cycle 1 of 5-fluorouracil/mitomycin-C and concurrent radiation on 05/20/2012. Cycle 2 of 5-fluorouracil/mitomycin-C on 06/24/2012 with completion of radiation on 06/28/2012.   -Restaging PET scan 08/02/2012 with resolution of the hypermetabolic anal primary and right inguinal lymph node. Persistent hypermetabolic supraclavicular and retroperitoneal nodes.   -cycle 1 of salvage Taxol/carboplatin chemotherapy on 08/30/2012 . Restaging CTs after 4 cycles of Taxol/carboplatin confirmed resolution of retroperitoneal lymphadenopathy and a decrease in the size of a previously noted left supraclavicular node. He completed cycle 6 of Taxol/carboplatin on 01/02/2013.   -A negative flexible sigmoidoscopy 04/01/2013   -Staging CT scans 10/09/2013 with stable small left supraclavicular node, no evidence of progressive metastatic disease   -Negative colonoscopy 03/19/2015 -Colonoscopy 05/02/2018-no evidence of recurrent anal cancer, multiple tubular adenomas removed from the colon -Colonoscopy 06/30/2019-no evidence of recurrent anal cancer, multiple tubular adenomas removed 3. Hypermetabolic activity at the right supraglottic larynx on the PET scan 04/17/2012 with associated soft tissue fullness status post a negative ENT evaluation by Dr. Pollyann Kennedy.   4. History of aortic stenosis-status post aortic valve replacement and coronary artery bypass surgery in November 2012.   5. Mucositis following cycle 1 chemotherapy. Resolved.   6. Skin breakdown at the perineum and penis secondary to chemotherapy and radiation. Resolved.   7. 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 Bone pain after Neulasta   9. Generalized seizure 09/24/2012-negative brain CT,? Etiology. He has been evaluated by neurology and was prescribed Vimpat.   10.history  of Thrombocytopenia secondary chemotherapy  11. Peripheral neuropathy-predating chemotherapy   12. Erectile dysfunction-followed by Dr. Isabel Caprice 13.  Right leg DVT 03/28/2022  CT chest 04/25/2022-nonocclusive filling defect of the right common femoral vein, acute saddle pulmonary embolus of the main pulmonary artery, left and right pulmonary arteries, and bilateral lateral lobar arteries Admission 04/26/2022-heparin anticoagulation, discharged on apixaban       Disposition: Kirk [name] is in clinical remission from anal cancer.  He is maintained on indefinite anticoagulation therapy after being diagnosed with a right leg DVT and pulmonary embolism in the summer 2023.  He will continue apixaban.  Kirk [name] will return for an office visit in 8 months.  Thornton Papas, MD  04/26/2023  12:13 PM

## 2023-05-01 DIAGNOSIS — K080 Exfoliation of teeth due to systemic causes: Secondary | ICD-10-CM | POA: Diagnosis not present

## 2023-05-09 ENCOUNTER — Ambulatory Visit: Payer: Medicare Other | Admitting: Vascular Surgery

## 2023-05-09 ENCOUNTER — Encounter: Payer: Self-pay | Admitting: Vascular Surgery

## 2023-05-09 VITALS — BP 131/77 | HR 80 | Temp 98.0°F | Resp 20 | Ht 71.0 in | Wt 224.0 lb

## 2023-05-09 DIAGNOSIS — M25562 Pain in left knee: Secondary | ICD-10-CM

## 2023-05-09 DIAGNOSIS — M25561 Pain in right knee: Secondary | ICD-10-CM

## 2023-05-09 NOTE — Progress Notes (Signed)
Patient ID: Kirk [name], male   DOB: [birth year], 77 y.o.   MRN: [id number]  Reason for Consult: New Patient (Initial Visit)   Referred by [name], Buren Kos, MD  Subjective:     HPI:  Kirk [name] is a 77 y.o. male with history of right foot greater than left foot pain as well as discoloration.  He really walks without limitation but does have knee pain is being worked up for knee replacement.  He states that he has numbness on the bottom of his feet this has been present for many years and he also has concomitant diabetes.  He does not have any tissue loss or ulceration.  He has worn compression stockings in the past but has had some difficulties with compression on his toes.  No previous vascular intervention.  He does not have any tissue loss or ulceration.  Past Medical History:  Diagnosis Date   Allergy    Anal cancer (HCC) 04/2012   Angina    6 wks. ago   Aortic stenosis    valve replacement   Arthritis    1995- cerv. fusion- [facility]   Atrial fibrillation (HCC) 08/20/2011   Post op heart surgery, no problems since, no meds   Blood in stool    Cancer (HCC) 04/09/2012   Rectum bx=invasive squamous cell carcinoma   CHF (congestive heart failure) (HCC)    Coronary artery disease    aortic stenosis, CAD   Diabetes mellitus    Type 2   DVT (deep venous thrombosis) (HCC)    ED (erectile dysfunction)    ED (erectile dysfunction)    Elevated cholesterol    Heart murmur    no problems   History of kidney stones    passed, lithrotrispy and surgery   History of radiation therapy 05/20/12-06/28/12   anal cancer=54gy total dose   Hypertension    Kidney stones    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   Malignant neoplasm of other sites of rectum, rectosigmoid junction, and anus 04/12/2012   Myocardial infarction [facility]) 2012   Personal history of colonic polyps 03/20/2012   Pulmonary embolism (HCC)    Rectal bleeding    Rectal mass 03/20/2012   S/P AVR (aortic  valve replacement) 08/16/2011   #34mm [facility] Ease pericardial tissue valve    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   LIMA to diagonal branch    Seizure disorder, grand mal (HCC)    only 1   Seizures (HCC)    after taking Oxycodone; not prescribed seizure med   Shortness of breath    Thrombosed hemorrhoids    Family History  Problem Relation Age of Onset   Breast cancer Mother    Heart attack Father    Esophageal cancer Maternal Grandfather    Anesthesia problems Neg Hx    Hypotension Neg Hx    Malignant hyperthermia Neg Hx    Pseudochol deficiency Neg Hx    Colon cancer Neg Hx    Colon polyps Neg Hx    Stomach cancer Neg Hx    Rectal cancer Neg Hx    Past Surgical History:  Procedure Laterality Date   ANAL FISSURE REPAIR     patient unsure of date   ANTERIOR CERVICAL DECOMP/DISCECTOMY FUSION N/A 01/22/2018   Procedure: ANTERIOR CERVICAL DECOMPRESSION/DISCECTOMY FUSION CERVICAL SIX- CERVICAL SEVEN;  Surgeon: Maeola Harman, MD;  Location: [facility] OR;  Service: Neurosurgery;  Laterality: N/A;  ANTERIOR CERVICAL DECOMPRESSION/DISCECTOMY FUSION CERVICAL SIX- CERVICAL  SEVEN   AORTIC VALVE REPLACEMENT  08/11/2011   Procedure: AORTIC VALVE REPLACEMENT (AVR);  Surgeon: Purcell Nails, MD;  Location[facility] OR;  Service: Open Heart Surgery;  Laterality: N/A;   C4-6 FUSION  1995   DR STERN   COLONOSCOPY     hx polyps   CORONARY ARTERY BYPASS GRAFT  08/16/2011   Procedure: CORONARY ARTERY BYPASS GRAFTING (CABG);  Surgeon: Purcell Nails, MD;  Location: [facility] OR;  Service: Open Heart Surgery;  Laterality: N/A;  CABG times one using left internal mammary artery   LITHOTRIPSY  2001 AND 2002   DR PETERSON   POLYPECTOMY     colon    Short Social History:  Social History   Tobacco Use   Smoking status: Never   Smokeless tobacco: Never  Substance Use Topics   Alcohol use: Yes    Comment: rarely    Allergies  Allergen Reactions   Cat Hair Extract Shortness Of Breath and Swelling   Hydrocodone  Other (See Comments)   Atorvastatin Other (See Comments)    Pt has muscle ache on Atorvastatin but IS ABLE TO TAKE LIPITOR without any issues   Dust Mite Extract Other (See Comments) and Swelling   Metformin Other (See Comments)    Diarrhea, muscle cramps   Oxycodone Other (See Comments)    Seizure PATIENT DOES NOT WANT ANY NARCOTICS   Semaglutide(0.25 Or 0.5mg -Dos) Nausea Only    AT HIGHER DOSE    Current Outpatient Medications  Medication Sig Dispense Refill   AMBULATORY NON FORMULARY MEDICATION 1 mL by Intracavernosal route once as needed for up to 1 dose. Medication Name: Trimix PAPAVERINE/PHENTOLAMINE/PROSTAGLANDIN E1 (5 mL vial) 30mg -2mg - 5 mL 2   amitriptyline (ELAVIL) 25 MG tablet Take 25 mg by mouth at bedtime as needed for sleep.     apixaban (ELIQUIS) 2.5 MG TABS tablet Take 1 tablet by mouth twice daily 60 tablet 3   aspirin EC 81 MG tablet Take 81 mg by mouth daily.     Cholecalciferol 1000 UNITS tablet Take 1,000 Units by mouth daily.     DULoxetine (CYMBALTA) 60 MG capsule Take 60 mg by mouth at bedtime.     FARXIGA 5 MG TABS tablet Take 1 tablet by mouth once daily 30 tablet 0   losartan (COZAAR) 50 MG tablet Take 1 tablet (50 mg total) by mouth daily. 90 tablet 1   metoprolol tartrate (LOPRESSOR) 25 MG tablet TAKE 1 TABLET BY MOUTH TWICE A DAY 180 tablet 3   MYRBETRIQ 25 MG TB24 tablet Take 25 mg by mouth at bedtime.      NEEDLE, DISP, 22 G 22G X 1-1/2" MISC 1 each by Does not apply route once a week. Use to administer medication. 50 each 1   rosuvastatin (CRESTOR) 40 MG tablet Take 40 mg by mouth at bedtime.     Semaglutide,0.25 or 0.5MG /DOS, (OZEMPIC, 0.25 OR 0.5 MG/DOSE,) 2 MG/3ML SOPN Inject 0.25 mg into the skin once a week. 3 mL 0   Syringe/Needle, Disp, (SYRINGE 3CC/18GX1-1/2") 18G X 1-1/2" 3 ML MISC 1 each by Does not apply route once a week. Use to draw up medication. 50 each 1   tadalafil (CIALIS) 10 MG tablet Take 1 tablet (10 mg total) by mouth daily as  needed for erectile dysfunction. 30 tablet 1   testosterone cypionate (DEPOTESTOTERONE CYPIONATE) 100 MG/ML injection Inject 0.5 mLs (50 mg total) into the muscle once a week. For IM use only 10 mL 0  Tetrahydrozoline HCl (VISINE OP) Place 1 drop into both eyes as needed (for redness/itching). Visine S     UltiCare Alcohol Swabs 70 % PADS 1 each by Does not apply route once a week. 100 each 1   Vitamins-Lipotropics (LIPO-FLAVONOID PLUS) TABS Take 1 tablet by mouth 3 (three) times daily after meals.     Zinc Sulfate (ZINC-220 PO) Take 220 mg by mouth daily.     No current facility-administered medications for this visit.    Review of Systems  Constitutional:  Constitutional negative. HENT: HENT negative.  Eyes: Eyes negative.  Cardiovascular: Positive for leg swelling.  GI: Gastrointestinal negative.  Musculoskeletal: Positive for leg pain and joint pain.  Skin: Skin negative.  Neurological: Neurological negative. Hematologic: Hematologic/lymphatic negative.        Objective:  Objective   Vitals:   05/09/23 1404  BP: 131/77  Pulse: 80  Resp: 20  Temp: 98 F (36.7 C)  SpO2: 93%     Physical Exam HENT:     Head: Normocephalic.     Nose: Nose normal.  Eyes:     Pupils: Pupils are equal, round, and reactive to light.  Cardiovascular:     Rate and Rhythm: Normal rate.     Pulses:          Popliteal pulses are 2+ on the right side and 2+ on the left side.  Pulmonary:     Effort: Pulmonary effort is normal.  Abdominal:     General: Abdomen is flat.     Palpations: Abdomen is soft.  Musculoskeletal:     Right lower leg: Edema present.     Left lower leg: Edema present.     Comments: Trace edema bilaterally  Skin:    Capillary Refill: Capillary refill takes less than 2 seconds.  Neurological:     General: No focal deficit present.     Mental Status: He is alert.     Motor: No weakness.  Psychiatric:        Mood and Affect: Mood normal.        Thought Content:  Thought content normal.        Judgment: Judgment normal.     Data: ABI Findings:  +---------+------------------+-----+---------+--------+  Right   Rt Pressure (mmHg)IndexWaveform Comment   +---------+------------------+-----+---------+--------+  Brachial 142                                       +---------+------------------+-----+---------+--------+  PTA     166               1.17 triphasic          +---------+------------------+-----+---------+--------+  DP      150               1.06 triphasic          +---------+------------------+-----+---------+--------+  Great Toe34                0.24                    +---------+------------------+-----+---------+--------+   +---------+------------------+-----+---------+-------+  Left    Lt Pressure (mmHg)IndexWaveform Comment  +---------+------------------+-----+---------+-------+  Brachial 132                                      +---------+------------------+-----+---------+-------+  PTA  155               1.09 triphasic         +---------+------------------+-----+---------+-------+  DP      148               1.04 triphasic         +---------+------------------+-----+---------+-------+  Great Toe59                0.42                   +---------+------------------+-----+---------+-------+   +-------+-----------+-----------+------------+------------+  ABI/TBIToday's ABIToday's TBIPrevious ABIPrevious TBI  +-------+-----------+-----------+------------+------------+  Right 1.17       0.24                                 +-------+-----------+-----------+------------+------------+  Left  1.09       0.42                                 +-------+-----------+-----------+------------+------------+         Summary:  Right: Resting right ankle-brachial index is within normal range. The  right toe-brachial index is abnormal.   Left: Resting left  ankle-brachial index is within normal range. The left  toe-brachial index is abnormal.       Assessment/Plan:    77 year old male with bilateral lower extremity pain appears to be orthopedic in nature with preserved ABIs.  He likely has venous insufficiency with very large visible saphenous veins but no concomitant varicosities.  Given his lack of symptoms I have recommended compression stockings but does not really need any workup for venous insufficiency at this time.  I have discussed that with orthopedic procedures he may experience increased swelling and would likely benefit from compression pre and postoperatively.  He demonstrates good understanding and can see me on an as-needed basis.     Maeola Harman MD [facility]

## 2023-05-15 ENCOUNTER — Other Ambulatory Visit (HOSPITAL_BASED_OUTPATIENT_CLINIC_OR_DEPARTMENT_OTHER): Payer: Self-pay | Admitting: Family Medicine

## 2023-05-15 DIAGNOSIS — R7989 Other specified abnormal findings of blood chemistry: Secondary | ICD-10-CM

## 2023-05-15 DIAGNOSIS — E119 Type 2 diabetes mellitus without complications: Secondary | ICD-10-CM

## 2023-05-17 ENCOUNTER — Telehealth (HOSPITAL_BASED_OUTPATIENT_CLINIC_OR_DEPARTMENT_OTHER): Payer: Self-pay | Admitting: Family Medicine

## 2023-05-17 DIAGNOSIS — M170 Bilateral primary osteoarthritis of knee: Secondary | ICD-10-CM | POA: Diagnosis not present

## 2023-05-17 NOTE — Telephone Encounter (Signed)
Prescription Request  05/17/2023  LOV: 04/25/2023  What is the name of the medication or equipment? Marcelline Deist & Testorone  Have you contacted your pharmacy to request a refill? Yes   Which pharmacy would you like this sent to?     [id number] - [place] ([place]), [place] - [address] [place] ([place]) [place] [address] Phone: [phone] Fax: [phone] Patient notified that their request is being sent to the clinical staff for review and that they should receive a response within 2 business days.   Please advise at [facility] [phone]

## 2023-05-18 ENCOUNTER — Telehealth: Payer: Self-pay | Admitting: *Deleted

## 2023-05-18 NOTE — Telephone Encounter (Signed)
Pre-operative Risk Assessment    Patient Name: Kirk [name]  DOB: 08/10/1946 MRN: [id number]   DATE OF LAST VISIT:  07/17/22 DR. SKAINS DATE OF NEXT VISIT: 08/30/23 DR. SKAINS  Request for Surgical Clearance    Procedure:   RIGHT TOTAL KNEE ARTHROPLASTY  Date of Surgery:  Clearance 08/27/23  (PT IS ON CANCELLATION LIST)                               Surgeon:  DR. Ollen Gross Surgeon's Group or Practice Name:  [name] Phone number:  [phone] ATTN: [name] Fax number:  [phone]   Type of Clearance Requested:   - Medical  - Pharmacy:  Hold Aspirin and Apixaban (Eliquis)     Type of Anesthesia:   CHOICE   Additional requests/questions:    [name]   05/18/2023, 5:12 PM

## 2023-05-21 ENCOUNTER — Encounter (HOSPITAL_BASED_OUTPATIENT_CLINIC_OR_DEPARTMENT_OTHER): Payer: Self-pay | Admitting: *Deleted

## 2023-05-21 ENCOUNTER — Telehealth: Payer: Self-pay

## 2023-05-21 ENCOUNTER — Telehealth: Payer: Self-pay | Admitting: *Deleted

## 2023-05-21 ENCOUNTER — Telehealth (HOSPITAL_BASED_OUTPATIENT_CLINIC_OR_DEPARTMENT_OTHER): Payer: Self-pay | Admitting: Family Medicine

## 2023-05-21 DIAGNOSIS — K080 Exfoliation of teeth due to systemic causes: Secondary | ICD-10-CM | POA: Diagnosis not present

## 2023-05-21 NOTE — Telephone Encounter (Signed)
Lvm patient to call back to setup appointment.

## 2023-05-21 NOTE — Telephone Encounter (Signed)
Verified on DPR that I am able to speak with wife Kirk [name]. Pt is scheduled for 9/27 at 940am for tele. Med rec and consent done    Patient Consent for Virtual Visit        Kirk [name] has provided verbal consent on 05/21/2023 for a virtual visit (video or telephone).   CONSENT FOR VIRTUAL VISIT FOR:  Kirk [name]  By participating in this virtual visit I agree to the following:  I hereby voluntarily request, consent and authorize [facility] and its employed or contracted physicians, physician assistants, nurse practitioners or other licensed health care professionals (the Practitioner), to provide me with telemedicine health care services (the "Services") as deemed necessary by the treating Practitioner. I acknowledge and consent to receive the Services by the Practitioner via telemedicine. I understand that the telemedicine visit will involve communicating with the Practitioner through live audiovisual communication technology and the disclosure of certain medical information by electronic transmission. I acknowledge that I have been given the opportunity to request an in-person assessment or other available alternative prior to the telemedicine visit and am voluntarily participating in the telemedicine visit.  I understand that I have the right to withhold or withdraw my consent to the use of telemedicine in the course of my care at any time, without affecting my right to future care or treatment, and that the Practitioner or I may terminate the telemedicine visit at any time. I understand that I have the right to inspect all information obtained and/or recorded in the course of the telemedicine visit and may receive copies of available information for a reasonable fee.  I understand that some of the potential risks of receiving the Services via telemedicine include:  Delay or interruption in medical evaluation due to technological equipment failure or disruption; Information  transmitted may not be sufficient (e.g. poor resolution of images) to allow for appropriate medical decision making by the Practitioner; and/or  In rare instances, security protocols could fail, causing a breach of personal health information.  Furthermore, I acknowledge that it is my responsibility to provide information about my medical history, conditions and care that is complete and accurate to the best of my ability. I acknowledge that Practitioner's advice, recommendations, and/or decision may be based on factors not within their control, such as incomplete or inaccurate data provided by me or distortions of diagnostic images or specimens that may result from electronic transmissions. I understand that the practice of medicine is not an exact science and that Practitioner makes no warranties or guarantees regarding treatment outcomes. I acknowledge that a copy of this consent can be made available to me via my patient portal [facility] MyChart), or I can request a printed copy by calling the office of [facility].    I understand that my insurance will be billed for this visit.   I have read or had this consent read to me. I understand the contents of this consent, which adequately explains the benefits and risks of the Services being provided via telemedicine.  I have been provided ample opportunity to ask questions regarding this consent and the Services and have had my questions answered to my satisfaction. I give my informed consent for the services to be provided through the use of telemedicine in my medical care

## 2023-05-21 NOTE — Telephone Encounter (Signed)
Verified on DPR that I am able to speak with wife [name]. Pt is scheduled for 9/27 at 940am for tele. Med rec and consent done

## 2023-05-21 NOTE — Telephone Encounter (Signed)
Fax in form for sx clearance, put in [place]'s box please fax and advise patient

## 2023-05-21 NOTE — Telephone Encounter (Signed)
Name: Kirk [name]  DOB: [birth year]  MRN: [id number]  Primary Cardiologist: Kirk Schultz, MD  Chart reviewed as part of pre-operative protocol coverage. Because of [name]'s past medical history and time since last visit, he will require a follow-up telephone visit in order to better assess preoperative cardiovascular risk.  Pre-op covering staff: - Please schedule appointment and call patient to inform them. If patient already had an upcoming appointment within acceptable timeframe, please add "pre-op clearance" to the appointment notes so provider is aware. - Please contact requesting surgeon's office via preferred method (i.e, phone, fax) to inform them of need for appointment prior to surgery.  Patient will need to contact heme-onc for Eliquis holding parameters.  If asymptomatic during phone call can hold aspirin x 5 to 7 days prior to procedure.  Please restart medically safe to do so.  Sharlene Dory, PA-C  05/21/2023, 1:19 PM

## 2023-05-21 NOTE — Telephone Encounter (Signed)
Patient was returning phone call about scheduling appt for medical clearance

## 2023-05-21 NOTE — Telephone Encounter (Signed)
Please reach out to heme/onc for clearance.  Patient being treated chronically for PE/DVT, with remote history of AF.  (Was not on anticoagulation before PE/DVT July 2023)

## 2023-05-21 NOTE — Telephone Encounter (Signed)
Please see other note

## 2023-05-28 ENCOUNTER — Encounter: Payer: Self-pay | Admitting: *Deleted

## 2023-05-28 NOTE — Progress Notes (Signed)
On 05/24/23 faxed clearance letter to Dr. Lequita Halt re: management of apixaban.  Hold for 2 days prior to surgery. Resume 1-2 days post surgery or as recommended by Dr. Lequita Halt. Needs early ambulation. Clear per hematology standpoint. Copy to HIM to scan.

## 2023-06-01 ENCOUNTER — Ambulatory Visit: Payer: Medicare Other | Attending: Internal Medicine

## 2023-06-01 DIAGNOSIS — Z01810 Encounter for preprocedural cardiovascular examination: Secondary | ICD-10-CM | POA: Diagnosis not present

## 2023-06-01 NOTE — Progress Notes (Signed)
Virtual Visit via Telephone Note   Because of Kirk [name]'s co-morbid illnesses, he is at least at moderate risk for complications without adequate follow up.  This format is felt to be most appropriate for this patient at this time.  The patient did not have access to video technology/had technical difficulties with video requiring transitioning to audio format only (telephone).  All issues noted in this document were discussed and addressed.  No physical exam could be performed with this format.  Please refer to the patient's chart for his consent to telehealth for [facility].  Evaluation Performed:  Preoperative cardiovascular risk assessment _____________   Date:  06/01/2023   Patient ID:  Kirk [name], DOB 06-16-1946, MRN [id number] Patient Location:  Home Provider location:   Office  Primary Care Provider:  [name], Buren Kos, MD Primary Cardiologist:  Donato Schultz, MD  Chief Complaint / Patient Profile   77 y.o. y/o male with a h/o coronary artery disease, CABG, aortic valve replacement, PE who is pending right TKA and presents today for telephonic preoperative cardiovascular risk assessment.  History of Present Illness    Kirk [name] is a 77 y.o. male who presents via audio/video conferencing for a telehealth visit today.  Pt was last seen in cardiology clinic on 07/17/2022 by Dr. Anne Fu.  At that time SUHEB BERMUDES was doing well .  The patient is now pending procedure as outlined above. Since his last visit, he remains stable from a cardiac standpoint.  Today he denies chest pain, shortness of breath, lower extremity edema, fatigue, palpitations, melena, hematuria, hemoptysis, diaphoresis, weakness, presyncope, syncope, orthopnea, and PND.   Past Medical History    Past Medical History:  Diagnosis Date   Allergy    Anal cancer (HCC) 04/2012   Angina    6 wks. ago   Aortic stenosis    valve replacement   Arthritis    1995- cerv. fusion- 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   CHF (congestive heart failure) (HCC)    Coronary artery disease    aortic stenosis, CAD   Diabetes mellitus    Type 2   DVT (deep venous thrombosis) (HCC)    ED (erectile dysfunction)    ED (erectile dysfunction)    Elevated cholesterol    Heart murmur    no problems   History of kidney stones    passed, lithrotrispy and surgery   History of radiation therapy 05/20/12-06/28/12   anal cancer=54gy total dose   Hypertension    Kidney stones    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   Malignant neoplasm of other sites of rectum, rectosigmoid junction, and anus 04/12/2012   Myocardial infarction (HCC) 2012   Personal history of colonic polyps 03/20/2012   Pulmonary embolism (HCC)    Rectal bleeding    Rectal mass 03/20/2012   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   #39mm [facility] Ease pericardial tissue valve    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   LIMA to diagonal branch    Seizure disorder, grand mal (HCC)    only 1   Seizures (HCC)    after taking Oxycodone; not prescribed seizure med   Shortness of breath    Thrombosed hemorrhoids    Past Surgical History:  Procedure Laterality Date  ANAL FISSURE REPAIR     patient unsure of date   ANTERIOR CERVICAL DECOMP/DISCECTOMY FUSION N/A 01/22/2018   Procedure: ANTERIOR CERVICAL DECOMPRESSION/DISCECTOMY FUSION CERVICAL SIX- CERVICAL SEVEN;  Surgeon: Maeola Harman, MD;  Location: [facility] OR;  Service: Neurosurgery;  Laterality: N/A;  ANTERIOR CERVICAL DECOMPRESSION/DISCECTOMY FUSION CERVICAL SIX- CERVICAL SEVEN   AORTIC VALVE REPLACEMENT  08/11/2011   Procedure: AORTIC VALVE REPLACEMENT (AVR);  Surgeon: Purcell Nails, MD;  Location: [facility] OR;  Service: Open Heart Surgery;  Laterality: N/A;   C4-6 FUSION  1995   DR STERN   COLONOSCOPY     hx  polyps   CORONARY ARTERY BYPASS GRAFT  08/16/2011   Procedure: CORONARY ARTERY BYPASS GRAFTING (CABG);  Surgeon: Purcell Nails, MD;  Location: [facility] OR;  Service: Open Heart Surgery;  Laterality: N/A;  CABG times one using left internal mammary artery   LITHOTRIPSY  2001 AND 2002   DR PETERSON   POLYPECTOMY     colon    Allergies  Allergies  Allergen Reactions   Cat Hair Extract Shortness Of Breath and Swelling   Hydrocodone Other (See Comments)   Atorvastatin Other (See Comments)    Pt has muscle ache on Atorvastatin but IS ABLE TO TAKE LIPITOR without any issues   Dust Mite Extract Other (See Comments) and Swelling   Metformin Other (See Comments)    Diarrhea, muscle cramps   Oxycodone Other (See Comments)    Seizure PATIENT DOES NOT WANT ANY NARCOTICS   Semaglutide(0.25 Or 0.5mg -Dos) Nausea Only    AT HIGHER DOSE    Home Medications    Prior to Admission medications   Medication Sig Start Date End Date Taking? Authorizing Provider  AMBULATORY NON FORMULARY MEDICATION 1 mL by Intracavernosal route once as needed for up to 1 dose. Medication Name: Trimix PAPAVERINE/PHENTOLAMINE/PROSTAGLANDIN E1 (5 mL vial) 30mg -2mg - 04/02/23   [name], Buren Kos, MD  amitriptyline (ELAVIL) 25 MG tablet Take 25 mg by mouth at bedtime as needed for sleep.    [provider]  apixaban Everlene Balls) 2.5 MG TABS tablet Take 1 tablet by mouth twice daily 02/13/23   [name], MD  aspirin EC 81 MG tablet Take 81 mg by mouth daily.    [provider]  Cholecalciferol 1000 UNITS tablet Take 1,000 Units by mouth daily.    [provider]  DULoxetine (CYMBALTA) 60 MG capsule Take 60 mg by mouth at bedtime. 01/06/16   [provider]  FARXIGA 5 MG TABS tablet Take 1 tablet by mouth once daily 05/17/23   [name], Buren Kos, MD  losartan (COZAAR) 50 MG tablet Take 1 tablet (50 mg total) by mouth daily. 01/25/23   [name], Raymond J, MD  metoprolol tartrate (LOPRESSOR)  25 MG tablet TAKE 1 TABLET BY MOUTH TWICE A DAY 03/20/23   [name], Buren Kos, MD  MYRBETRIQ 25 MG TB24 tablet Take 25 mg by mouth at bedtime.  05/28/19   [provider]  NEEDLE, DISP, 22 G 22G X 1-1/2" MISC 1 each by Does not apply route once a week. Use to administer medication. 02/28/23   [name], Buren Kos, MD  rosuvastatin (CRESTOR) 40 MG tablet Take 40 mg by mouth at bedtime. 05/25/21   [provider]  Semaglutide,0.25 or 0.5MG /DOS, (OZEMPIC, 0.25 OR 0.5 MG/DOSE,) 2 MG/3ML SOPN Inject 0.25 mg into the skin once a week. 04/25/23   [name], Raymond J, MD  Syringe/Needle, Disp, (SYRINGE 3CC/18GX1-1/2") 18G X 1-1/2" 3  ML MISC 1 each by Does not apply route once a week. Use to draw up medication. 02/28/23   [name], Raymond J, MD  tadalafil (CIALIS) 10 MG tablet Take 1 tablet (10 mg total) by mouth daily as needed for erectile dysfunction. 11/23/22   [name], Buren Kos, MD  testosterone cypionate (DEPOTESTOTERONE CYPIONATE) 100 MG/ML injection INJECT 1/2 (ONE-HALF) ML (CC) INTRAMUSCULARLY ONCE A WEEK 05/17/23   [name], Buren Kos, MD  Tetrahydrozoline HCl (VISINE OP) Place 1 drop into both eyes as needed (for redness/itching). Visine S    [provider]  UltiCare Alcohol Swabs 70 % PADS 1 each by Does not apply route once a week. 02/28/23   [name], Buren Kos, MD  Vitamins-Lipotropics (LIPO-FLAVONOID PLUS) TABS Take 1 tablet by mouth 3 (three) times daily after meals.    [provider]  Zinc Sulfate (ZINC-220 PO) Take 220 mg by mouth daily.    [provider]    Physical Exam    Vital Signs:  CORYE JEANNOT does not have vital signs available for review today.  Given telephonic nature of communication, physical exam is limited. AAOx3. NAD. Normal affect.  Speech and respirations are unlabored.  Accessory Clinical Findings    None  Assessment & Plan    1.  Preoperative Cardiovascular Risk Assessment:RIGHT TOTAL KNEE ARTHROPLASTY , Dr. Lequita Halt, [facility], fax #[phone]      Primary Cardiologist: Donato Schultz, MD  Chart reviewed as part of pre-operative protocol coverage. Given past medical history and time since last visit, based on ACC/AHA guidelines, JES [place] would be at acceptable risk for the planned procedure without further cardiovascular testing.   His RCRI is moderate risk, 6.6% risk of major cardiac event.  He is able to complete greater than 4 METS of physical activity.  Patient's Eliquis is not managed by cardiology.   He may hold his aspirin for 5-7 days prior to his procedure.  Please resume as soon as hemostasis is achieved.  Patient was advised that if he develops new symptoms prior to surgery to contact our office to arrange a follow-up appointment.  He verbalized understanding.  I will route this recommendation to the requesting party via Epic fax function and remove from pre-op pool.      Time:   Today, I have spent 5 minutes with the patient with telehealth technology discussing medical history, symptoms, and management plan. Prior to patient's phone evaluation I spent greater than 10 minutes reviewing their past medical history and cardiac medications.    Ronney Asters, NP  06/01/2023, 7:27 AM

## 2023-06-04 NOTE — Patient Instructions (Addendum)
SURGICAL WAITING ROOM VISITATION Patients having surgery or a procedure may have no more than 2 support people in the waiting area - these visitors may rotate.    Children under the age of 42 must have an adult with them who is not the patient.  If the patient needs to stay at the hospital during part of their recovery, the visitor guidelines for inpatient rooms apply. Pre-op nurse will coordinate an appropriate time for 1 support person to accompany patient in pre-op.  This support person may not rotate.    Please refer to the [facility] website for the visitor guidelines for Inpatients (after your surgery is over and you are in a regular room).       Your procedure is scheduled on: 06-18-23   Report to [facility] Main Entrance    Report to admitting at 10:10 AM   Call this number if you have problems the morning of surgery [phone]   Do not eat food :After Midnight.   After Midnight you may have the following liquids until 9:40 AM DAY OF SURGERY  Water Non-Citrus Juices (without pulp, NO RED-Apple, White grape, White cranberry) Black Coffee (NO MILK/CREAM OR CREAMERS, sugar ok)  Clear Tea (NO MILK/CREAM OR CREAMERS, sugar ok) regular and decaf                             Plain Jell-O (NO RED)                                           Fruit ices (not with fruit pulp, NO RED)                                     Popsicles (NO RED)                                                               Sports drinks like Gatorade (NO RED)                   The day of surgery:  Drink ONE (1) Pre-Surgery G2 by 9:40 AM the morning of surgery. Drink in one sitting. Do not sip.  This drink was given to you during your hospital  pre-op appointment visit. Nothing else to drink after completing the Pre-Surgery G2.          If you have questions, please contact your surgeon's office.   FOLLOW  ANY ADDITIONAL PRE OP INSTRUCTIONS YOU RECEIVED FROM YOUR SURGEON'S OFFICE!!!      Oral Hygiene is also important to reduce your risk of infection.                                    Remember - BRUSH YOUR TEETH THE MORNING OF SURGERY WITH YOUR REGULAR TOOTHPASTE   Do NOT smoke after Midnight   Take these medicines the morning of surgery with A SIP OF WATER:   Metoprolol  Stop all vitamins and herbal  supplements 7 days before surgery How to Manage Your Diabetes Before and After Surgery  Why is it important to control my blood sugar before and after surgery? Improving blood sugar levels before and after surgery helps healing and can limit problems. A way of improving blood sugar control is eating a healthy diet by:  Eating less sugar and carbohydrates  Increasing activity/exercise  Talking with your doctor about reaching your blood sugar goals High blood sugars (greater than 180 mg/dL) can raise your risk of infections and slow your recovery, so you will need to focus on controlling your diabetes during the weeks before surgery. Make sure that the doctor who takes care of your diabetes knows about your planned surgery including the date and location.  How do I manage my blood sugar before surgery? Check your blood sugar at least 4 times a day, starting 2 days before surgery, to make sure that the level is not too high or low. Check your blood sugar the morning of your surgery when you wake up and every 2 hours until you get to the Short Stay unit. If your blood sugar is less than 70 mg/dL, you will need to treat for low blood sugar: Do not take insulin. Treat a low blood sugar (less than 70 mg/dL) with  cup of clear juice (cranberry or apple), 4 glucose tablets, OR glucose gel. Recheck blood sugar in 15 minutes after treatment (to make sure it is greater than 70 mg/dL). If your blood sugar is not greater than 70 mg/dL on recheck, call [phone] for further instructions. Report your blood sugar to the short stay nurse when you get to Short Stay.  If you are  admitted to the hospital after surgery: Your blood sugar will be checked by the staff and you will probably be given insulin after surgery (instead of oral diabetes medicines) to make sure you have good blood sugar levels. The goal for blood sugar control after surgery is 80-180 mg/dL.   WHAT DO I DO ABOUT MY DIABETES MEDICATION?  Do not take oral diabetes medicines (pills) the morning of surgery.  Semaglutide hold 7 days before surgery  (do not take after 06-10-23)        Farxiga hold 3 days before surgery (do not take after 06-14-23)  DO NOT TAKE THE FOLLOWING 7 DAYS PRIOR TO SURGERY: Ozempic, Wegovy, Rybelsus (Semaglutide), Byetta (exenatide), Bydureon (exenatide ER), Victoza, Saxenda (liraglutide), or Trulicity (dulaglutide) Mounjaro (Tirzepatide) Adlyxin (Lixisenatide), Polyethylene Glycol Loxenatide.  Reviewed and Endorsed by [facility] Patient Education Committee, August 2015  Bring CPAP mask and tubing day of surgery.                              You may not have any metal on your body including  jewelry, and body piercing             Do not wear  lotions, powders, cologne, or deodorant              Men may shave face and neck.   Do not bring valuables to the hospital.  IS NOT RESPONSIBLE   FOR VALUABLES.   Contacts, dentures or bridgework may not be worn into surgery.   Bring small overnight bag day of surgery.   DO NOT BRING YOUR HOME MEDICATIONS TO THE HOSPITAL. PHARMACY WILL DISPENSE MEDICATIONS LISTED ON YOUR MEDICATION LIST TO YOU DURING YOUR ADMISSION IN THE HOSPITAL!   Special  Instructions: Bring a copy of your healthcare power of attorney and living will documents the day of surgery if you haven't scanned them before.              Please read over the following fact sheets you were given: IF YOU HAVE QUESTIONS ABOUT YOUR PRE-OP INSTRUCTIONS PLEASE CALL [phone] Gwen  If you received a COVID test during your pre-op visit  it is requested that you  wear a mask when out in public, stay away from anyone that may not be feeling well and notify your surgeon if you develop symptoms. If you test positive for Covid or have been in contact with anyone that has tested positive in the last 10 days please notify you surgeon.    Pre-operative 5 CHG Bath Instructions   You can play a key role in reducing the risk of infection after surgery. Your skin needs to be as free of germs as possible. You can reduce the number of germs on your skin by washing with CHG (chlorhexidine gluconate) soap before surgery. CHG is an antiseptic soap that kills germs and continues to kill germs even after washing.   DO NOT use if you have an allergy to chlorhexidine/CHG or antibacterial soaps. If your skin becomes reddened or irritated, stop using the CHG and notify one of our RNs at [phone].   Please shower with the CHG soap starting 4 days before surgery using the following schedule:     Please keep in mind the following:  DO NOT shave, including legs and underarms, starting the day of your first shower.   You may shave your face at any point before/day of surgery.  Place clean sheets on your bed the day you start using CHG soap. Use a clean washcloth (not used since being washed) for each shower. DO NOT sleep with pets once you start using the CHG.   CHG Shower Instructions:  If you choose to wash your hair and private area, wash first with your normal shampoo/soap.  After you use shampoo/soap, rinse your hair and body thoroughly to remove shampoo/soap residue.  Turn the water OFF and apply about 3 tablespoons (45 ml) of CHG soap to a CLEAN washcloth.  Apply CHG soap ONLY FROM YOUR NECK DOWN TO YOUR TOES (washing for 3-5 minutes)  DO NOT use CHG soap on face, private areas, open wounds, or sores.  Pay special attention to the area where your surgery is being performed.  If you are having back surgery, having someone wash your back for you may be  helpful. Wait 2 minutes after CHG soap is applied, then you may rinse off the CHG soap.  Pat dry with a clean towel  Put on clean clothes/pajamas   If you choose to wear lotion, please use ONLY the CHG-compatible lotions on the back of this paper.     Additional instructions for the day of surgery: DO NOT APPLY any lotions, deodorants, cologne, or perfumes.   Put on clean/comfortable clothes.  Brush your teeth.  Ask your nurse before applying any prescription medications to the skin.      CHG Compatible Lotions   Aveeno Moisturizing lotion  Cetaphil Moisturizing Cream  Cetaphil Moisturizing Lotion  Clairol Herbal Essence Moisturizing Lotion, Dry Skin  Clairol Herbal Essence Moisturizing Lotion, Extra Dry Skin  Clairol Herbal Essence Moisturizing Lotion, Normal Skin  Curel Age Defying Therapeutic Moisturizing Lotion with Alpha Hydroxy  Curel Extreme Care Body Lotion  Curel Soothing Hands Moisturizing Hand Lotion  Curel Therapeutic Moisturizing Cream, Fragrance-Free  Curel Therapeutic Moisturizing Lotion, Fragrance-Free  Curel Therapeutic Moisturizing Lotion, Original Formula  Eucerin Daily Replenishing Lotion  Eucerin Dry Skin Therapy Plus Alpha Hydroxy Crme  Eucerin Dry Skin Therapy Plus Alpha Hydroxy Lotion  Eucerin Original Crme  Eucerin Original Lotion  Eucerin Plus Crme Eucerin Plus Lotion  Eucerin TriLipid Replenishing Lotion  Keri Anti-Bacterial Hand Lotion  Keri Deep Conditioning Original Lotion Dry Skin Formula Softly Scented  Keri Deep Conditioning Original Lotion, Fragrance Free Sensitive Skin Formula  Keri Lotion Fast Absorbing Fragrance Free Sensitive Skin Formula  Keri Lotion Fast Absorbing Softly Scented Dry Skin Formula  Keri Original Lotion  Keri Skin Renewal Lotion Keri Silky Smooth Lotion  Keri Silky Smooth Sensitive Skin Lotion  Nivea Body Creamy Conditioning Oil  Nivea Body Extra Enriched Lotion  Nivea Body Original Lotion  Nivea Body Sheer  Moisturizing Lotion Nivea Crme  Nivea Skin Firming Lotion  NutraDerm 30 Skin Lotion  NutraDerm Skin Lotion  NutraDerm Therapeutic Skin Cream  NutraDerm Therapeutic Skin Lotion  ProShield Protective Hand Cream  Provon moisturizing lotion   PATIENT SIGNATURE_________________________________  NURSE SIGNATURE__________________________________  ________________________________________________________________________    Kirk [name]  An incentive spirometer is a tool that can help keep your lungs clear and active. This tool measures how well you are filling your lungs with each breath. Taking long deep breaths may help reverse or decrease the chance of developing breathing (pulmonary) problems (especially infection) following: A long period of time when you are unable to move or be active. BEFORE THE PROCEDURE  If the spirometer includes an indicator to show your best effort, your nurse or respiratory therapist will set it to a desired goal. If possible, sit up straight or lean slightly forward. Try not to slouch. Hold the incentive spirometer in an upright position. INSTRUCTIONS FOR USE  Sit on the edge of your bed if possible, or sit up as far as you can in bed or on a chair. Hold the incentive spirometer in an upright position. Breathe out normally. Place the mouthpiece in your mouth and seal your lips tightly around it. Breathe in slowly and as deeply as possible, raising the piston or the ball toward the top of the column. Hold your breath for 3-5 seconds or for as long as possible. Allow the piston or ball to fall to the bottom of the column. Remove the mouthpiece from your mouth and breathe out normally. Rest for a few seconds and repeat Steps 1 through 7 at least 10 times every 1-2 hours when you are awake. Take your time and take a few normal breaths between deep breaths. The spirometer may include an indicator to show your best effort. Use the indicator as a goal to work  toward during each repetition. After each set of 10 deep breaths, practice coughing to be sure your lungs are clear. If you have an incision (the cut made at the time of surgery), support your incision when coughing by placing a pillow or rolled up towels firmly against it. Once you are able to get out of bed, walk around indoors and cough well. You may stop using the incentive spirometer when instructed by your caregiver.  RISKS AND COMPLICATIONS Take your time so you do not get dizzy or light-headed. If you are in pain, you may need to take or ask for pain medication before doing incentive spirometry. It is harder to take a deep breath if you are having pain. AFTER USE Rest and breathe slowly  and easily. It can be helpful to keep track of a log of your progress. Your caregiver can provide you with a simple table to help with this. If you are using the spirometer at home, follow these instructions: SEEK MEDICAL CARE IF:  You are having difficultly using the spirometer. You have trouble using the spirometer as often as instructed. Your pain medication is not giving enough relief while using the spirometer. You develop fever of 100.5 F (38.1 C) or higher. SEEK IMMEDIATE MEDICAL CARE IF:  You cough up bloody sputum that had not been present before. You develop fever of 102 F (38.9 C) or greater. You develop worsening pain at or near the incision site. MAKE SURE YOU:  Understand these instructions. Will watch your condition. Will get help right away if you are not doing well or get worse. Document Released: 01/01/2007 Document Revised: 11/13/2011 Document Reviewed: 03/04/2007 [facility] Patient Information 2014 [place], [place].   ________________________________________________________________________

## 2023-06-04 NOTE — Progress Notes (Addendum)
COVID Vaccine Completed:  Yes  Date of COVID positive in last 90 days:  PCP - Raymond [name], MD Cardiologist - Donato Schultz, MD  Cardiac clearance in Epic dated 06-01-23 by [name], NP  Chest x-ray -  EKG - 07-17-22 Epic Stress Test -  ECHO - 04-27-22 Epic Cardiac Cath -  Pacemaker/ICD device last checked: Spinal Cord Stimulator:  Bowel Prep -   Sleep Study -  CPAP -   Fasting Blood Sugar -  Checks Blood Sugar _____ times a day  Semaglutide Last dose of GLP1 agonist-  N/A GLP1 instructions:  do not take after 06-10-23   Farxiga Last dose of SGLT-2 inhibitors-  N/A SGLT-2 instructions: do not take after 06-14-23   Blood Thinner Instructions:  Eliquis Time Aspirin Instructions: ASA 81.  Okay to hold x5-7 days Last Dose:  Activity level:  Can go up a flight of stairs and perform activities of daily living without stopping and without symptoms of chest pain or shortness of breath.  Able to exercise without symptoms  Unable to go up a flight of stairs without symptoms of     Anesthesia review: Afib, CAD, HTN, DM, CKD. S/P AVR & CABG, CHF, hx of saddle PE  Seizure disorder  Patient denies shortness of breath, fever, cough and chest pain at PAT appointment  Patient verbalized understanding of instructions that were given to them at the PAT appointment. Patient was also instructed that they will need to review over the PAT instructions again at home before surgery.

## 2023-06-05 ENCOUNTER — Other Ambulatory Visit: Payer: Self-pay

## 2023-06-05 ENCOUNTER — Encounter (HOSPITAL_COMMUNITY)
Admission: RE | Admit: 2023-06-05 | Discharge: 2023-06-05 | Disposition: A | Discharge status: Home / Self Care | Payer: Medicare Other | Source: Ambulatory Visit | Attending: Orthopedic Surgery | Admitting: Orthopedic Surgery

## 2023-06-05 ENCOUNTER — Encounter (HOSPITAL_COMMUNITY): Payer: Self-pay

## 2023-06-05 VITALS — BP 129/79 | HR 65 | Temp 98.5°F | Resp 16 | Ht 71.0 in | Wt 227.8 lb

## 2023-06-05 DIAGNOSIS — I2510 Atherosclerotic heart disease of native coronary artery without angina pectoris: Secondary | ICD-10-CM | POA: Insufficient documentation

## 2023-06-05 DIAGNOSIS — I10 Essential (primary) hypertension: Secondary | ICD-10-CM | POA: Insufficient documentation

## 2023-06-05 DIAGNOSIS — Z01818 Encounter for other preprocedural examination: Secondary | ICD-10-CM | POA: Diagnosis not present

## 2023-06-05 DIAGNOSIS — Z7901 Long term (current) use of anticoagulants: Secondary | ICD-10-CM | POA: Diagnosis not present

## 2023-06-05 DIAGNOSIS — Z01812 Encounter for preprocedural laboratory examination: Secondary | ICD-10-CM | POA: Insufficient documentation

## 2023-06-05 DIAGNOSIS — Z79899 Other long term (current) drug therapy: Secondary | ICD-10-CM | POA: Diagnosis not present

## 2023-06-05 DIAGNOSIS — Z9889 Other specified postprocedural states: Secondary | ICD-10-CM | POA: Diagnosis not present

## 2023-06-05 DIAGNOSIS — Z951 Presence of aortocoronary bypass graft: Secondary | ICD-10-CM | POA: Diagnosis not present

## 2023-06-05 DIAGNOSIS — E119 Type 2 diabetes mellitus without complications: Secondary | ICD-10-CM | POA: Diagnosis not present

## 2023-06-05 LAB — CBC
HCT: 54.8 % — ABNORMAL HIGH (ref 39.0–52.0)
Hemoglobin: 16.8 g/dL (ref 13.0–17.0)
MCH: 30.3 pg (ref 26.0–34.0)
MCHC: 30.7 g/dL (ref 30.0–36.0)
MCV: 98.9 fL (ref 80.0–100.0)
Platelets: 183 10*3/uL (ref 150–400)
RBC: 5.54 MIL/uL (ref 4.22–5.81)
RDW: 14.6 % (ref 11.5–15.5)
WBC: 9.1 10*3/uL (ref 4.0–10.5)
nRBC: 0 % (ref 0.0–0.2)

## 2023-06-05 LAB — BASIC METABOLIC PANEL
Anion gap: 8 (ref 5–15)
BUN: 13 mg/dL (ref 8–23)
CO2: 26 mmol/L (ref 22–32)
Calcium: 9.2 mg/dL (ref 8.9–10.3)
Chloride: 102 mmol/L (ref 98–111)
Creatinine, Ser: 1.21 mg/dL (ref 0.61–1.24)
GFR, Estimated: >60 mL/min (ref >60)
Glucose, Bld: 98 mg/dL (ref 70–99)
Potassium: 4.6 mmol/L (ref 3.5–5.1)
Sodium: 136 mmol/L (ref 135–145)

## 2023-06-05 LAB — SURGICAL PCR SCREEN
MRSA, PCR: NEGATIVE
Staphylococcus aureus: POSITIVE — AB

## 2023-06-05 LAB — GLUCOSE, CAPILLARY: Glucose-Capillary: 111 mg/dL — ABNORMAL HIGH (ref 70–99)

## 2023-06-05 NOTE — Progress Notes (Signed)
   06/05/23 1420  OBSTRUCTIVE SLEEP APNEA  Have you ever been diagnosed with sleep apnea through a sleep study? No  Do you snore loudly (loud enough to be heard through closed doors)?  1  Do you often feel tired, fatigued, or sleepy during the daytime (such as falling asleep during driving or talking to someone)? 0  Has anyone observed you stop breathing during your sleep? 0  Do you have, or are you being treated for high blood pressure? 1  BMI more than 35 kg/m2? 0  Age > 50 (1-yes) 1  Neck circumference greater than:Male 16 inches or larger, Male 17inches or larger? 1  Male Gender (Yes=1) 1  Obstructive Sleep Apnea Score 5  Score 5 or greater  Results sent to PCP

## 2023-06-05 NOTE — H&P (Signed)
TOTAL KNEE ADMISSION H&P  Patient is being admitted for right total knee arthroplasty.  Subjective:  Chief Complaint: Right knee pain.  HPI: Kirk [name], 77 y.o. male has a history of pain and functional disability in the right knee due to arthritis and has failed non-surgical conservative treatments for greater than 12 weeks to include NSAID's and/or analgesics, corticosteriod injections, viscosupplementation injections, and activity modification. Onset of symptoms was gradual, starting several years ago with gradually worsening course since that time. The patient noted no past surgery on the right knee.  Patient currently rates pain in the right knee at 8 out of 10 with activity. Patient has night pain, worsening of pain with activity and weight bearing, and pain that interferes with activities of daily living. Patient has evidence of  significant medial compartment bone-on-bone changes with varus deformity and patellofemoral arthritis  by imaging studies. There is no active infection.  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   History of anal cancer 04/12/2012   Cancer (HCC) 04/09/2012   Rectal mass 03/20/2012   Personal history of colonic polyps 03/20/2012   Atrial fibrillation (HCC) 08/20/2011   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   S/P CABG x 1  08/16/2011   Coronary artery disease involving native coronary artery of native heart without angina pectoris 08/07/2011   Aortic stenosis 07/31/2011   Elevated cholesterol    ED (erectile dysfunction)    Kidney stones     Past Medical History:  Diagnosis Date   Allergy    Anal cancer (HCC) 04/2012   Angina    6 wks. ago   Aortic stenosis    valve replacement   Arthritis    1995- cerv. 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   CHF (congestive heart failure) (HCC)    Coronary artery disease    aortic stenosis, CAD   Diabetes mellitus    Type 2   DVT (deep venous thrombosis) (HCC)    ED (erectile dysfunction)    ED (erectile dysfunction)    Elevated cholesterol    Heart murmur    no problems   History of kidney stones    passed, lithrotrispy and surgery   History of radiation therapy 05/20/12-06/28/12   anal cancer=54gy total dose   Hypertension    Kidney stones    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   Malignant neoplasm of other sites of rectum, rectosigmoid junction, and anus 04/12/2012   Myocardial infarction [facility]) 2012   Personal history of colonic polyps 03/20/2012   Pulmonary embolism (HCC)    Rectal bleeding    Rectal mass 03/20/2012   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   #67mm [facility] Ease  pericardial tissue valve    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   LIMA to diagonal branch    Seizure disorder, grand mal (HCC)    only 1   Seizures (HCC)    after taking Oxycodone; not prescribed seizure med   Shortness of breath    Thrombosed hemorrhoids     Past Surgical History:  Procedure Laterality Date   ANAL FISSURE REPAIR     patient unsure of date   ANTERIOR CERVICAL DECOMP/DISCECTOMY FUSION N/A 01/22/2018   Procedure: ANTERIOR CERVICAL DECOMPRESSION/DISCECTOMY FUSION CERVICAL SIX-  CERVICAL SEVEN;  Surgeon: Maeola Harman, MD;  Location: [facility] OR;  Service: Neurosurgery;  Laterality: N/A;  ANTERIOR CERVICAL DECOMPRESSION/DISCECTOMY FUSION CERVICAL SIX- CERVICAL SEVEN   AORTIC VALVE REPLACEMENT  08/11/2011   Procedure: AORTIC VALVE REPLACEMENT (AVR);  Surgeon: Purcell Nails, MD;  Location: [facility] OR;  Service: Open Heart Surgery;  Laterality: N/A;   C4-6 FUSION  1995   DR STERN   COLONOSCOPY     hx polyps   CORONARY ARTERY BYPASS GRAFT  08/16/2011   Procedure: CORONARY ARTERY BYPASS GRAFTING (CABG);  Surgeon: Purcell Nails, MD;  Location: [facility] OR;  Service: Open Heart Surgery;  Laterality: N/A;  CABG times one using left internal mammary artery   LITHOTRIPSY  2001 AND 2002   DR PETERSON   POLYPECTOMY     colon    Prior to Admission medications   Medication Sig Start Date End Date Taking? Authorizing Provider  AMBULATORY NON FORMULARY MEDICATION 1 mL by Intracavernosal route once as needed for up to 1 dose. Medication Name: Trimix PAPAVERINE/PHENTOLAMINE/PROSTAGLANDIN E1 (5 mL vial) 30mg -2mg - 04/02/23   [name], Buren Kos, MD  amitriptyline (ELAVIL) 25 MG tablet Take 25 mg by mouth at bedtime as needed for sleep.    [provider]  apixaban Everlene Balls) 2.5 MG TABS tablet Take 1 tablet by mouth twice daily 02/13/23   [name], MD  aspirin EC 81 MG tablet Take 81 mg by mouth daily.    [provider]  Cholecalciferol 1000 UNITS tablet Take 1,000 Units by mouth daily.    [provider]  DULoxetine (CYMBALTA) 60 MG capsule Take 60 mg by mouth at bedtime. 01/06/16   [provider]  FARXIGA 5 MG TABS tablet Take 1 tablet by mouth once daily 05/17/23   [name], Buren Kos, MD  losartan (COZAAR) 50 MG tablet Take 1 tablet (50 mg total) by mouth daily. 01/25/23   [name], Raymond J, MD  metoprolol tartrate (LOPRESSOR) 25 MG tablet TAKE 1 TABLET BY MOUTH TWICE A DAY 03/20/23   [name], Buren Kos, MD  MYRBETRIQ 25 MG TB24 tablet Take 25 mg by  mouth at bedtime.  05/28/19   [provider]  NEEDLE, DISP, 22 G 22G X 1-1/2" MISC 1 each by Does not apply route once a week. Use to administer medication. 02/28/23   [name], Buren Kos, MD  rosuvastatin (CRESTOR) 40 MG tablet Take 40 mg by mouth at bedtime. 05/25/21   [provider]  Semaglutide,0.25 or 0.5MG /DOS, (OZEMPIC, 0.25 OR 0.5 MG/DOSE,) 2 MG/3ML SOPN Inject 0.25 mg into the skin once a week. 04/25/23   [name], Raymond J, MD  Syringe/Needle, Disp, (SYRINGE 3CC/18GX1-1/2") 18G X 1-1/2" 3 ML MISC 1 each by Does not apply route once a week. Use to draw up medication. 02/28/23   [name], Raymond J, MD  tadalafil (CIALIS) 10 MG tablet Take 1 tablet (10 mg total) by  mouth daily as needed for erectile dysfunction. 11/23/22   [name], Buren Kos, MD  testosterone cypionate (DEPOTESTOTERONE CYPIONATE) 100 MG/ML injection INJECT 1/2 (ONE-HALF) ML (CC) INTRAMUSCULARLY ONCE A WEEK 05/17/23   [name], Buren Kos, MD  Tetrahydrozoline HCl (VISINE OP) Place 1 drop into both eyes as needed (for redness/itching). Visine S    [provider]  UltiCare Alcohol Swabs 70 % PADS 1 each by Does not apply route once a week. 02/28/23   [name], Buren Kos, MD  Vitamins-Lipotropics (LIPO-FLAVONOID PLUS) TABS Take 1 tablet by mouth 3 (three) times daily after meals.    [provider]  Zinc Sulfate (ZINC-220 PO) Take 220 mg by mouth daily.    [provider]    Allergies  Allergen Reactions   Cat Hair Extract Shortness Of Breath and Swelling   Hydrocodone Other (See Comments)   Atorvastatin Other (See Comments)    Pt has muscle ache on Atorvastatin but IS ABLE TO TAKE LIPITOR without any issues   Dust Mite Extract Other (See Comments) and Swelling   Metformin Other (See Comments)    Diarrhea, muscle cramps   Oxycodone Other (See Comments)    Seizure PATIENT DOES NOT WANT ANY NARCOTICS   Semaglutide(0.25 Or 0.5mg -Dos) Nausea Only    AT HIGHER DOSE    Social History    Socioeconomic History   Marital status: Married    Spouse name: [name]   Number of children: 2   Years of education: Not on file   Highest education level: Not on file  Occupational History   Occupation: Surveyor, minerals semi-retired  Tobacco Use   Smoking status: Never   Smokeless tobacco: Never  Vaping Use   Vaping status: Never Used  Substance and Sexual Activity   Alcohol use: Yes    Comment: rarely   Drug use: No   Sexual activity: Not on file  Other Topics Concern   Not on file  Social History Narrative   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, [name]   Self-employed painter   Social Determinants of Health   Financial Resource Strain: Low Risk  (02/06/2023)   Overall Financial Resource Strain (CARDIA)    Difficulty of Paying Living Expenses: Not hard at all  Food Insecurity: No Food Insecurity (02/06/2023)   Hunger Vital Sign    Worried About Running Out of Food in the Last Year: Never true    Ran Out of Food in the Last Year: Never true  Transportation Needs: No Transportation Needs (02/06/2023)   PRAPARE - Administrator, Civil Service (Medical): No    Lack of Transportation (Non-Medical): No  Physical Activity: Sufficiently Active (02/06/2023)   Exercise Vital Sign    Days of Exercise per Week: 7 days    Minutes of Exercise per Session: 30 min  Stress: No Stress Concern Present (02/06/2023)   [employer] of Occupational Health - Occupational Stress Questionnaire    Feeling of Stress : Not at all  Social Connections: Socially Integrated (02/06/2023)   Social Connection and Isolation Panel [NHANES]    Frequency of Communication with Friends and Family: More than three times a week    Frequency of Social Gatherings with Friends and Family: More than three times a week    Attends Religious Services: More than 4 times per year    Active Member of [employer] or Organizations: Yes    Attends Engineer, structural: More than 4 times per year    Marital Status: Married  Intimate  Partner Violence: Not At Risk (02/06/2023)   Humiliation, Afraid, Rape, and Kick questionnaire    Fear of Current or Ex-Partner: No    Emotionally Abused: No    Physically Abused: No    Sexually Abused: No    Tobacco Use: Low Risk  (06/01/2023)   Patient History    Smoking Tobacco Use: Never    Smokeless Tobacco Use: Never    Passive Exposure: Not on file   Social History   Substance and Sexual Activity  Alcohol Use Yes   Comment: rarely    Family History  Problem Relation Age of Onset   Breast cancer Mother    Heart attack Father    Esophageal cancer Maternal Grandfather    Anesthesia problems Neg Hx    Hypotension Neg Hx    Malignant hyperthermia Neg Hx    Pseudochol deficiency Neg Hx    Colon cancer Neg Hx    Colon polyps Neg Hx    Stomach cancer Neg Hx    Rectal cancer Neg Hx     Review of Systems  Constitutional:  Negative for chills and fever.  HENT: Negative.    Eyes: Negative.   Respiratory:  Negative for cough and shortness of breath.   Cardiovascular:  Negative for chest pain and palpitations.  Gastrointestinal:  Negative for abdominal pain, nausea and vomiting.  Genitourinary:  Negative for dysuria, frequency and urgency.  Musculoskeletal:  Positive for joint pain.  Skin:  Negative for rash.   Objective:  Physical Exam: Well nourished and well developed.  General: Alert and oriented x3, cooperative and pleasant, no acute distress.  Head: normocephalic, atraumatic, neck supple.  Eyes: EOMI. Abdomen: non-tender to palpation and soft, normoactive bowel sounds. Musculoskeletal: - Right knee No intra-articular effusion, presence of Baker's cyst, range of motion 10-120 degrees with crepitus, tenderness medially with slight lateral tenderness, no instability.  - Left knee No effusion, large Baker's cyst, range of motion 5-125 degrees with crepitus, tenderness medially, no lateral tenderness or instability. Calves soft and nontender. Motor function intact in  LE. Strength 5/5 LE bilaterally. Neuro: Distal pulses 2+. Sensation to light touch intact in LE.  Vital signs in last 24 hours:    Imaging Review Plain radiographs demonstrate severe degenerative joint disease of the right knee. The overall alignment is mild varus. The bone quality appears to be adequate for age and reported activity level.  Assessment/Plan:  End stage arthritis, right knee   The patient history, physical examination, clinical judgment of the provider and imaging studies are consistent with end stage degenerative joint disease of the right knee and total knee arthroplasty is deemed medically necessary. The treatment options including medical management, injection therapy arthroscopy and arthroplasty were discussed at length. The risks and benefits of total knee arthroplasty were presented and reviewed. The risks due to aseptic loosening, infection, stiffness, patella tracking problems, thromboembolic complications and other imponderables were discussed. The patient acknowledged the explanation, agreed to proceed with the plan and consent was signed. Patient is being admitted for inpatient treatment for surgery, pain control, PT, OT, prophylactic antibiotics, VTE prophylaxis, progressive ambulation and ADLs and discharge planning. The patient is planning to be discharged  home .  Patient's anticipated LOS is less than 2 midnights, meeting these requirements: - Lives within 1 hour of care - Has a competent adult at home to recover with post-op - NO history of  - Chronic pain requiring opiods  - Diabetes  - Heart attack  - Stroke  -  Cardiac arrhythmia  - Respiratory Failure/COPD  - Renal failure  - Anemia  - Advanced Liver disease  Therapy Plans: EO [place] Disposition: Home with Wife Planned DVT Prophylaxis: Eliquis 2.5 mg BID + 81 mg ASA (home regimen) DME Needed: RW PCP: Raymond [name], MD (contacting for clearance) Cardiologist: Donato Schultz, MD (clearance  received) TXA: Topical Allergies: hydrocodone/oxycodone (seizure) Anesthesia Concerns: None BMI: 31.7 Last HgbA1c: not diabetic  Pharmacy: [facility])  Other: -Given clearance form to take to PCP -Hx AVR (2012) and DVT (2023) -Discussed dilaudid post-op  - Patient was instructed on what medications to stop prior to surgery. - Follow-up visit in 2 weeks with Dr. Lequita Halt - Begin physical therapy following surgery - Pre-operative lab work as pre-surgical testing - Prescriptions will be provided in hospital at time of discharge  R. Arcola Jansky, PA-C Orthopedic Surgery [facility] [place]

## 2023-06-06 ENCOUNTER — Encounter (HOSPITAL_BASED_OUTPATIENT_CLINIC_OR_DEPARTMENT_OTHER): Payer: Self-pay | Admitting: Family Medicine

## 2023-06-06 ENCOUNTER — Encounter (HOSPITAL_COMMUNITY): Payer: Self-pay

## 2023-06-06 ENCOUNTER — Ambulatory Visit (HOSPITAL_BASED_OUTPATIENT_CLINIC_OR_DEPARTMENT_OTHER): Payer: Medicare Other | Admitting: Family Medicine

## 2023-06-06 VITALS — BP 123/66 | HR 55 | Ht 71.0 in | Wt 239.7 lb

## 2023-06-06 DIAGNOSIS — Z01818 Encounter for other preprocedural examination: Secondary | ICD-10-CM | POA: Diagnosis not present

## 2023-06-06 NOTE — Progress Notes (Signed)
DISCUSSION: Kirk [name] is a 76 yo male who presents to PAT prior to right TKA. PMH of CAD s/p CABG (2012), hx of MI, AS s/p AVR (2012), CHF, post-op A.fib, hx of DVT/PE, DM, rectal cancer s/p chemo and XRT, arthritis, hx of cervical fusion (01/2018). Pt had high screening STOP BANG score of 5.  Patient follows with Cardiology fox hx of CAD s/p CABG, AS s/p AVR. Last seen in clinic by Dr. Anne Fu on 07/17/2022. Was doing overall well at that time. He was diagnosed with a DVT and saddle PE in 04/2022. Lifelong anticoagulation was recommended and he is taking Eliquis. Doing well on GDMT. Advised f/u in 1 year. Cardiac clearance on 06/01/23:  "Chart reviewed as part of pre-operative protocol coverage. Given past medical history and time since last visit, based on ACC/AHA guidelines, Kirk [name] would be at acceptable risk for the planned procedure without further cardiovascular testing.  His RCRI is moderate risk, 6.6% risk of major cardiac event.  He is able to complete greater than 4 METS of physical activity. Patient's Eliquis is not managed by cardiology.   He may hold his aspirin for 5-7 days prior to his procedure.  Please resume as soon as hemostasis is achieved."  Patient follows with PCP for chronic medical conditions. Last seen on 06/06/23. BP and DM appear controlled.Cleared for surgery:  "Preoperative clearance Based on history and exam will order the following preoperative tests: None.  Patient does have labs arranged through surgeon/hospital. Surgeon has also requested cardiac clearance from patient's cardiologist.  Recommend continuing with this. Patient is medically cleared to proceed with planned surgery."   VS: BP 129/79   Pulse 65   Temp 36.9 C (Oral)   Resp 16   Ht 5\' 11"  (1.803 m)   Wt 103.3 kg   SpO2 98%   BMI 31.77 kg/m   PROVIDERS: [name], Buren Kos, MD Cardiologist: Donato Schultz, MD  LABS: Labs reviewed: Acceptable for surgery. (all labs ordered are listed, but  only abnormal results are displayed)  Labs Reviewed  SURGICAL PCR SCREEN - Abnormal; Notable for the following components:      Result Value   Staphylococcus aureus POSITIVE (*)    All other components within normal limits  CBC - Abnormal; Notable for the following components:   HCT 54.8 (*)    All other components within normal limits  GLUCOSE, CAPILLARY - Abnormal; Notable for the following components:   Glucose-Capillary 111 (*)    All other components within normal limits  BASIC METABOLIC PANEL     IMAGES:  CTA Chest 04/25/2022:  IMPRESSION: CHEST CTA:   1. Acute saddle pulmonary embolus of the main pulmonary artery, left and right pulmonary arteries, and bilateral lobar arteries. 2. Calcifications are seen in the bilateral distal right and left pulmonary arteries, likely post thrombotic change related to prior pulmonary embolus. 3. Elevated RV to LV ratio, findings can be seen in the setting of right heart strain. Correlate with echocardiography. 4. Branching, tubular high attenuation structure of the left lower lobe, compatible with a bronchocele, finding was present on 2014 exam, but has increased in size.   ABDOMEN AND PELVIS CT:   1. Nonocclusive filling defect of the right common femoral vein, compatible with history of DVT. 2. No evidence of metastatic disease in the abdomen or pelvis. 3. Aortic Atherosclerosis (ICD10-I70.0).    EKG:   CV:   Echo 04/27/2022:  IMPRESSIONS     1. Left ventricular ejection fraction, by  estimation, is 65 to 70%. The  left ventricle has normal function. The left ventricle has no regional  wall motion abnormalities. There is mild concentric left ventricular  hypertrophy. Left ventricular diastolic  parameters are consistent with Grade I diastolic dysfunction (impaired  relaxation).   2. Right ventricular systolic function is normal. The right ventricular  size is normal. Tricuspid regurgitation signal is inadequate for  assessing  PA pressure.   3. Left atrial size was moderately dilated.   4. The mitral valve is abnormal. No evidence of mitral valve  regurgitation. Moderate mitral annular calcification.   5. The aortic valve has been repaired/replaced. Aortic valve  regurgitation is not visualized. There is a 25 mm Magna Ease Pericardial  Valve valve present in the aortic position. Aortic valve mean gradient  measures 16.0 mmHg. Prosthetic leaflets are not  well visualized, however, mean gradient appears stable to slightly  improved from prior study in 07/2020 (mean gradient at that time).   6. Aortic dilatation noted. There is borderline dilatation of the  ascending aorta, measuring 38 mm.   7. The inferior vena cava is normal in size with greater than 50%  respiratory variability, suggesting right atrial pressure of 3 mmHg.   Comparison(s): Compared to prior study on 07/2020, there is no significant  change. Prior mean AoV gradient .   Past Medical History:  Diagnosis Date   Allergy    Anal cancer (HCC) 04/2012   Angina    6 wks. ago   Aortic stenosis    valve replacement   Arthritis    1995- cerv. 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   CHF (congestive heart failure) (HCC)    Coronary artery disease    aortic stenosis, CAD   Diabetes mellitus    Type 2   DVT (deep venous thrombosis) (HCC)    ED (erectile dysfunction)    ED (erectile dysfunction)    Elevated cholesterol    Heart murmur    no problems   History of kidney stones    passed, lithrotrispy and surgery   History of radiation therapy 05/20/12-06/28/12   anal cancer=54gy total dose   Hypertension    Kidney stones    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   Malignant neoplasm of other sites of rectum, rectosigmoid junction, and anus 04/12/2012    Myocardial infarction (HCC) 2012   Personal history of colonic polyps 03/20/2012   Pulmonary embolism (HCC)    Rectal bleeding    Rectal mass 03/20/2012   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   #4mm [facility] Ease pericardial tissue valve    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   LIMA to diagonal branch    Seizure disorder, grand mal (HCC)    only 1   Seizures (HCC)    after taking Oxycodone; not prescribed seizure med   Shortness of breath    Thrombosed hemorrhoids     Past Surgical History:  Procedure Laterality Date   ANAL FISSURE REPAIR     patient unsure of date   ANTERIOR CERVICAL DECOMP/DISCECTOMY FUSION N/A 01/22/2018   Procedure: ANTERIOR CERVICAL DECOMPRESSION/DISCECTOMY FUSION CERVICAL SIX- CERVICAL SEVEN;  Surgeon: Maeola Harman, MD;  Location: [facility] OR;  Service: Neurosurgery;  Laterality: N/A;  ANTERIOR CERVICAL DECOMPRESSION/DISCECTOMY FUSION CERVICAL SIX- CERVICAL SEVEN   AORTIC  VALVE REPLACEMENT  08/11/2011   Procedure: AORTIC VALVE REPLACEMENT (AVR);  Surgeon: Purcell Nails, MD;  Location: [facility] OR;  Service: Open Heart Surgery;  Laterality: N/A;   C4-6 FUSION  1995   DR STERN   COLONOSCOPY     hx polyps   CORONARY ARTERY BYPASS GRAFT  08/16/2011   Procedure: CORONARY ARTERY BYPASS GRAFTING (CABG);  Surgeon: Purcell Nails, MD;  Location: [facility] OR;  Service: Open Heart Surgery;  Laterality: N/A;  CABG times one using left internal mammary artery   LITHOTRIPSY  2001 AND 2002   DR PETERSON   POLYPECTOMY     colon    MEDICATIONS:  AMBULATORY NON FORMULARY MEDICATION   amitriptyline (ELAVIL) 25 MG tablet   apixaban (ELIQUIS) 2.5 MG TABS tablet   aspirin EC 81 MG tablet   Cholecalciferol 1000 UNITS tablet   DULoxetine (CYMBALTA) 60 MG capsule   FARXIGA 5 MG TABS tablet   ketoconazole (NIZORAL) 2 % shampoo   losartan (COZAAR) 50 MG tablet   metoprolol tartrate (LOPRESSOR) 25 MG tablet   MYRBETRIQ 25 MG TB24 tablet   NEEDLE, DISP, 22 G 22G X 1-1/2" MISC    rosuvastatin (CRESTOR) 40 MG tablet   Semaglutide,0.25 or 0.5MG /DOS, (OZEMPIC, 0.25 OR 0.5 MG/DOSE,) 2 MG/3ML SOPN   Syringe/Needle, Disp, (SYRINGE 3CC/18GX1-1/2") 18G X 1-1/2" 3 ML MISC   tadalafil (CIALIS) 10 MG tablet   testosterone cypionate (DEPOTESTOTERONE CYPIONATE) 100 MG/ML injection   UltiCare Alcohol Swabs 70 % PADS   Vitamins-Lipotropics (LIPO-FLAVONOID PLUS) TABS   Zinc Sulfate (ZINC-220 PO)   No current facility-administered medications for this encounter.   Marcille Blanco [facility] Surgical Short Stay/Anesthesiology [facility] Phone [phone] 06/06/2023 11:57 AM

## 2023-06-06 NOTE — Progress Notes (Signed)
    Procedures performed today:    None.  Independent interpretation of notes and tests performed by another provider:   None.  Brief History, Exam, Impression, and Recommendations:    Kirk [name] is a 77 y.o. presenting for preoperative evaluation/clearance. The planned procedure is right knee arthroplasty with the treatment goal of reduced pain and improved function. Cardiac risk for planned procedure is Intermediate (1 to 5%) - intraperitoneal or intrathoracic surgery, carotid endarterectomy, head and neck surgery, orthopedic surgery, prostate surgery  Signs or symptoms of cardiovascular disease? No New or unstable cardiopulmonary signs or symptoms? No Urinary symptoms or invasive urologic procedure? No  BP 123/66 (BP Location: Left Arm, Patient Position: Sitting, Cuff Size: Normal)   Pulse (!) 55   Ht 5\' 11"  (1.803 m)   Wt 239 lb 11.2 oz (108.7 kg)   SpO2 96%   BMI 33.43 kg/m   Exam: Patient is in no acute distress, vital signs stable Cardiovascular exam with regular rate and rhythm Lungs clear to auscultation bilaterally  Preoperative clearance Based on history and exam will order the following preoperative tests: None.  Patient does have labs arranged through surgeon/hospital. Surgeon has also requested cardiac clearance from patient's cardiologist.  Recommend continuing with this. Patient is medically cleared to proceed with planned surgery.   ___________________________________________ Yash Cacciola [name], MD, ABFM, CAQSM Primary Care and Sports Medicine [facility]

## 2023-06-06 NOTE — Progress Notes (Signed)
STAPH+ results routed to Dr. Aluisio 

## 2023-06-06 NOTE — Progress Notes (Incomplete Revision)
DISCUSSION: Kirk [name] is a 77 yo male who presents to PAT prior to right TKA. PMH of CAD s/p CABG (2012), hx of MI, AS s/p AVR (2012), CHF, post-op A.fib, hx of DVT/PE, DM, rectal cancer s/p chemo and XRT, arthritis, hx of cervical fusion (01/2018). Pt had high screening STOP BANG score of 5.  Patient follows with Cardiology fox hx of CAD s/p CABG, AS s/p AVR. Last seen in clinic by Dr. Anne Fu on 07/17/2022. Was doing overall well at that time. He was diagnosed with a DVT and saddle PE in 04/2022. Lifelong anticoagulation was recommended and he is taking Eliquis. Doing well on GDMT. Advised f/u in 1 year. Cardiac clearance on 06/01/23:  "Chart reviewed as part of pre-operative protocol coverage. Given past medical history and time since last visit, based on ACC/AHA guidelines, Kirk [name] would be at acceptable risk for the planned procedure without further cardiovascular testing.  His RCRI is moderate risk, 6.6% risk of major cardiac event.  He is able to complete greater than 4 METS of physical activity. Patient's Eliquis is not managed by cardiology.   He may hold his aspirin for 5-7 days prior to his procedure.  Please resume as soon as hemostasis is achieved."  Patient follows with PCP for chronic medical conditions. Last seen on 04/25/23. BP and DM appear controlled.***  VS: BP 129/79   Pulse 65   Temp 36.9 C (Oral)   Resp 16   Ht 5\' 11"  (1.803 m)   Wt 103.3 kg   SpO2 98%   BMI 31.77 kg/m   PROVIDERS: [name], Buren Kos, MD Cardiologist: Kirk Schultz, MD  LABS: Labs reviewed: Acceptable for surgery. (all labs ordered are listed, but only abnormal results are displayed)  Labs Reviewed  SURGICAL PCR SCREEN - Abnormal; Notable for the following components:      Result Value   Staphylococcus aureus POSITIVE (*)    All other components within normal limits  CBC - Abnormal; Notable for the following components:   HCT 54.8 (*)    All other components within normal limits   GLUCOSE, CAPILLARY - Abnormal; Notable for the following components:   Glucose-Capillary 111 (*)    All other components within normal limits  BASIC METABOLIC PANEL     IMAGES:  CTA Chest 04/25/2022:  IMPRESSION: CHEST CTA:   1. Acute saddle pulmonary embolus of the main pulmonary artery, left and right pulmonary arteries, and bilateral lobar arteries. 2. Calcifications are seen in the bilateral distal right and left pulmonary arteries, likely post thrombotic change related to prior pulmonary embolus. 3. Elevated RV to LV ratio, findings can be seen in the setting of right heart strain. Correlate with echocardiography. 4. Branching, tubular high attenuation structure of the left lower lobe, compatible with a bronchocele, finding was present on 2014 exam, but has increased in size.   ABDOMEN AND PELVIS CT:   1. Nonocclusive filling defect of the right common femoral vein, compatible with history of DVT. 2. No evidence of metastatic disease in the abdomen or pelvis. 3. Aortic Atherosclerosis (ICD10-I70.0).    EKG:   CV:   Echo 04/27/2022:  IMPRESSIONS     1. Left ventricular ejection fraction, by estimation, is 65 to 70%. The  left ventricle has normal function. The left ventricle has no regional  wall motion abnormalities. There is mild concentric left ventricular  hypertrophy. Left ventricular diastolic  parameters are consistent with Grade I diastolic dysfunction (impaired  relaxation).   2. Right  ventricular systolic function is normal. The right ventricular  size is normal. Tricuspid regurgitation signal is inadequate for assessing  PA pressure.   3. Left atrial size was moderately dilated.   4. The mitral valve is abnormal. No evidence of mitral valve  regurgitation. Moderate mitral annular calcification.   5. The aortic valve has been repaired/replaced. Aortic valve  regurgitation is not visualized. There is a 25 mm Magna Ease Pericardial  Valve valve  present in the aortic position. Aortic valve mean gradient  measures 16.0 mmHg. Prosthetic leaflets are not  well visualized, however, mean gradient appears stable to slightly  improved from prior study in 07/2020 (mean gradient at that time).   6. Aortic dilatation noted. There is borderline dilatation of the  ascending aorta, measuring 38 mm.   7. The inferior vena cava is normal in size with greater than 50%  respiratory variability, suggesting right atrial pressure of 3 mmHg.   Comparison(s): Compared to prior study on 07/2020, there is no significant  change. Prior mean AoV gradient .   Past Medical History:  Diagnosis Date   Allergy    Anal cancer (HCC) 04/2012   Angina    6 wks. ago   Aortic stenosis    valve replacement   Arthritis    1995- cerv. 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   CHF (congestive heart failure) (HCC)    Coronary artery disease    aortic stenosis, CAD   Diabetes mellitus    Type 2   DVT (deep venous thrombosis) (HCC)    ED (erectile dysfunction)    ED (erectile dysfunction)    Elevated cholesterol    Heart murmur    no problems   History of kidney stones    passed, lithrotrispy and surgery   History of radiation therapy 05/20/12-06/28/12   anal cancer=54gy total dose   Hypertension    Kidney stones    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   Malignant neoplasm of other sites of rectum, rectosigmoid junction, and anus 04/12/2012   Myocardial infarction (HCC) 2012   Personal history of colonic polyps 03/20/2012   Pulmonary embolism (HCC)    Rectal bleeding    Rectal mass 03/20/2012   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   #37mm [facility] Ease pericardial tissue valve    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   LIMA to diagonal branch    Seizure disorder, grand mal  (HCC)    only 1   Seizures (HCC)    after taking Oxycodone; not prescribed seizure med   Shortness of breath    Thrombosed hemorrhoids     Past Surgical History:  Procedure Laterality Date   ANAL FISSURE REPAIR     patient unsure of date   ANTERIOR CERVICAL DECOMP/DISCECTOMY FUSION N/A 01/22/2018   Procedure: ANTERIOR CERVICAL DECOMPRESSION/DISCECTOMY FUSION CERVICAL SIX- CERVICAL SEVEN;  Surgeon: Maeola Harman, MD;  Location: [facility] OR;  Service: Neurosurgery;  Laterality: N/A;  ANTERIOR CERVICAL DECOMPRESSION/DISCECTOMY FUSION CERVICAL SIX- CERVICAL SEVEN   AORTIC VALVE REPLACEMENT  08/11/2011   Procedure: AORTIC VALVE REPLACEMENT (AVR);  Surgeon: Purcell Nails, MD;  Location: [facility] OR;  Service: Open Heart Surgery;  Laterality: N/A;   C4-6 FUSION  1995   DR STERN   COLONOSCOPY     hx polyps  CORONARY ARTERY BYPASS GRAFT  08/16/2011   Procedure: CORONARY ARTERY BYPASS GRAFTING (CABG);  Surgeon: Purcell Nails, MD;  Location: [facility] OR;  Service: Open Heart Surgery;  Laterality: N/A;  CABG times one using left internal mammary artery   LITHOTRIPSY  2001 AND 2002   DR PETERSON   POLYPECTOMY     colon    MEDICATIONS:  AMBULATORY NON FORMULARY MEDICATION   amitriptyline (ELAVIL) 25 MG tablet   apixaban (ELIQUIS) 2.5 MG TABS tablet   aspirin EC 81 MG tablet   Cholecalciferol 1000 UNITS tablet   DULoxetine (CYMBALTA) 60 MG capsule   FARXIGA 5 MG TABS tablet   ketoconazole (NIZORAL) 2 % shampoo   losartan (COZAAR) 50 MG tablet   metoprolol tartrate (LOPRESSOR) 25 MG tablet   MYRBETRIQ 25 MG TB24 tablet   NEEDLE, DISP, 22 G 22G X 1-1/2" MISC   rosuvastatin (CRESTOR) 40 MG tablet   Semaglutide,0.25 or 0.5MG /DOS, (OZEMPIC, 0.25 OR 0.5 MG/DOSE,) 2 MG/3ML SOPN   Syringe/Needle, Disp, (SYRINGE 3CC/18GX1-1/2") 18G X 1-1/2" 3 ML MISC   tadalafil (CIALIS) 10 MG tablet   testosterone cypionate (DEPOTESTOTERONE CYPIONATE) 100 MG/ML injection   UltiCare Alcohol Swabs 70 % PADS    Vitamins-Lipotropics (LIPO-FLAVONOID PLUS) TABS   Zinc Sulfate (ZINC-220 PO)   No current facility-administered medications for this encounter.   Marcille Blanco [facility] Surgical Short Stay/Anesthesiology [facility] Phone [phone] 06/06/2023 11:57 AM

## 2023-06-06 NOTE — Anesthesia Preprocedure Evaluation (Addendum)
Anesthesia Evaluation  Patient identified by MRN, date of birth, ID band Patient awake    Reviewed: Allergy & Precautions, NPO status , Patient's Chart, lab work & pertinent test results, reviewed documented beta blocker date and time   History of Anesthesia Complications Negative for: history of anesthetic complications  Airway Mallampati: II  TM Distance: >3 FB Neck ROM: Full    Dental  (+) Dental Advisory Given   Pulmonary neg pulmonary ROS   breath sounds clear to auscultation       Cardiovascular hypertension, Pt. on medications and Pt. on home beta blockers (-) angina + CAD, + CABG and + DVT  + dysrhythmias Atrial Fibrillation + Valvular Problems/Murmurs (s/p AVR)  Rhythm:Regular Rate:Normal  '23 ECHO: EF 65 to 70%.  1. The LV has normal function, no regional wall motion abnormalities. There is mild concentric LVH. Grade I diastolic dysfunction (impaired relaxation).   2. RVF is normal. The right ventricular size is normal.   3. Left atrial size was moderately dilated.   4. The mitral valve is abnormal. No evidence of MR. Moderate mitral annular calcification.   5. The aortic valve has been repaired/replaced. AI is not visualized. There is a 25 mm Magna Ease Pericardial Valve present in the aortic position. Aortic valve mean gradient  measures 16.0 mmHg. Prosthetic leaflets are not well visualized, however, mean gradient appears stable to slightly improved from prior study in 07/2020 (mean gradient at that time).     Neuro/Psych negative neurological ROS  negative psych ROS   GI/Hepatic Neg liver ROS,,,H/o rectal cancer   Endo/Other  diabetes (glu 151)Hypothyroidism  Ozempic: last shot 10d ago  Renal/GU Renal InsufficiencyRenal disease     Musculoskeletal  (+) Arthritis ,    Abdominal   Peds  Hematology Eliquis    Anesthesia Other Findings   Reproductive/Obstetrics                              Anesthesia Physical Anesthesia Plan  ASA: 3  Anesthesia Plan: Spinal   Post-op Pain Management: Tylenol PO (pre-op)* and Regional block*   Induction:   PONV Risk Score and Plan: 1 and Treatment may vary due to age or medical condition  Airway Management Planned: Natural Airway and Simple Face Mask  Additional Equipment: None  Intra-op Plan:   Post-operative Plan:   Informed Consent: I have reviewed the patients History and Physical, chart, labs and discussed the procedure including the risks, benefits and alternatives for the proposed anesthesia with the patient or authorized representative who has indicated his/her understanding and acceptance.     Dental advisory given  Plan Discussed with: CRNA and Surgeon  Anesthesia Plan Comments: (See PAT note from 10/1 by K Gekas PA-C Plan routine monitors, SAB with adductor canal block for post op analgesia )        Anesthesia Quick Evaluation

## 2023-06-06 NOTE — Assessment & Plan Note (Signed)
Based on history and exam will order the following preoperative tests: None.  Patient does have labs arranged through surgeon/hospital. Surgeon has also requested cardiac clearance from patient's cardiologist.  Recommend continuing with this. Patient is medically cleared to proceed with planned surgery.

## 2023-06-07 DIAGNOSIS — M25561 Pain in right knee: Secondary | ICD-10-CM | POA: Diagnosis not present

## 2023-06-10 ENCOUNTER — Other Ambulatory Visit: Payer: Self-pay | Admitting: Oncology

## 2023-06-10 DIAGNOSIS — I824Z1 Acute embolism and thrombosis of unspecified deep veins of right distal lower extremity: Secondary | ICD-10-CM

## 2023-06-11 ENCOUNTER — Other Ambulatory Visit (HOSPITAL_BASED_OUTPATIENT_CLINIC_OR_DEPARTMENT_OTHER): Payer: Self-pay | Admitting: Family Medicine

## 2023-06-11 DIAGNOSIS — E119 Type 2 diabetes mellitus without complications: Secondary | ICD-10-CM

## 2023-06-12 ENCOUNTER — Other Ambulatory Visit: Payer: Self-pay | Admitting: Oncology

## 2023-06-12 DIAGNOSIS — I824Z1 Acute embolism and thrombosis of unspecified deep veins of right distal lower extremity: Secondary | ICD-10-CM

## 2023-06-12 NOTE — Telephone Encounter (Signed)
Was refilled on 06/11/23

## 2023-06-14 ENCOUNTER — Other Ambulatory Visit (HOSPITAL_BASED_OUTPATIENT_CLINIC_OR_DEPARTMENT_OTHER): Payer: Self-pay | Admitting: *Deleted

## 2023-06-14 ENCOUNTER — Telehealth (HOSPITAL_BASED_OUTPATIENT_CLINIC_OR_DEPARTMENT_OTHER): Payer: Self-pay | Admitting: Family Medicine

## 2023-06-14 MED ORDER — MYRBETRIQ 25 MG PO TB24: 25.0000 mg | ORAL_TABLET | Freq: Every day | ORAL | 3 refills | Status: DC

## 2023-06-14 MED ORDER — DULOXETINE HCL 60 MG PO CPEP: 60.0000 mg | ORAL_CAPSULE | Freq: Every evening | ORAL | 1 refills | Status: DC

## 2023-06-14 NOTE — Telephone Encounter (Signed)
Patient's spouse came by the office requesting to have med refill sent to the pharmacy. Pt and spouse are requesting to have Rx sent as a 90-day supply and also would like to have some refills added if okay to be done.  Medication is duloxetine 60mg .  Pharmacy that medication is needing to be sent to is [facility].  Dr. [name] please advise.

## 2023-06-18 ENCOUNTER — Other Ambulatory Visit: Payer: Self-pay

## 2023-06-18 ENCOUNTER — Ambulatory Visit (HOSPITAL_COMMUNITY): Payer: Medicare Other | Admitting: Anesthesiology

## 2023-06-18 ENCOUNTER — Observation Stay (HOSPITAL_COMMUNITY)
Admission: RE | Admit: 2023-06-18 | Discharge: 2023-06-20 | Disposition: A | Discharge status: Home / Self Care | Payer: Medicare Other | Source: Ambulatory Visit | Attending: Orthopedic Surgery | Admitting: Orthopedic Surgery

## 2023-06-18 ENCOUNTER — Encounter (HOSPITAL_COMMUNITY): Payer: Self-pay | Admitting: Orthopedic Surgery

## 2023-06-18 ENCOUNTER — Ambulatory Visit (HOSPITAL_COMMUNITY): Payer: Medicare Other | Admitting: Medical

## 2023-06-18 ENCOUNTER — Encounter (HOSPITAL_COMMUNITY)
Admission: RE | Disposition: A | Discharge status: Home / Self Care | Payer: Self-pay | Source: Ambulatory Visit | Attending: Orthopedic Surgery

## 2023-06-18 DIAGNOSIS — Z85048 Personal history of other malignant neoplasm of rectum, rectosigmoid junction, and anus: Secondary | ICD-10-CM | POA: Diagnosis not present

## 2023-06-18 DIAGNOSIS — I2510 Atherosclerotic heart disease of native coronary artery without angina pectoris: Secondary | ICD-10-CM

## 2023-06-18 DIAGNOSIS — Z7901 Long term (current) use of anticoagulants: Secondary | ICD-10-CM | POA: Diagnosis not present

## 2023-06-18 DIAGNOSIS — E119 Type 2 diabetes mellitus without complications: Secondary | ICD-10-CM | POA: Diagnosis not present

## 2023-06-18 DIAGNOSIS — I10 Essential (primary) hypertension: Secondary | ICD-10-CM

## 2023-06-18 DIAGNOSIS — I509 Heart failure, unspecified: Secondary | ICD-10-CM | POA: Insufficient documentation

## 2023-06-18 DIAGNOSIS — Z79899 Other long term (current) drug therapy: Secondary | ICD-10-CM | POA: Diagnosis not present

## 2023-06-18 DIAGNOSIS — E039 Hypothyroidism, unspecified: Secondary | ICD-10-CM | POA: Insufficient documentation

## 2023-06-18 DIAGNOSIS — G8918 Other acute postprocedural pain: Secondary | ICD-10-CM | POA: Diagnosis not present

## 2023-06-18 DIAGNOSIS — Z01818 Encounter for other preprocedural examination: Principal | ICD-10-CM

## 2023-06-18 DIAGNOSIS — M1711 Unilateral primary osteoarthritis, right knee: Principal | ICD-10-CM | POA: Insufficient documentation

## 2023-06-18 DIAGNOSIS — Z86711 Personal history of pulmonary embolism: Secondary | ICD-10-CM | POA: Insufficient documentation

## 2023-06-18 DIAGNOSIS — Z951 Presence of aortocoronary bypass graft: Secondary | ICD-10-CM | POA: Diagnosis not present

## 2023-06-18 DIAGNOSIS — I110 Hypertensive heart disease with heart failure: Secondary | ICD-10-CM | POA: Insufficient documentation

## 2023-06-18 DIAGNOSIS — M179 Osteoarthritis of knee, unspecified: Secondary | ICD-10-CM | POA: Diagnosis present

## 2023-06-18 DIAGNOSIS — I4891 Unspecified atrial fibrillation: Secondary | ICD-10-CM | POA: Insufficient documentation

## 2023-06-18 DIAGNOSIS — Z86718 Personal history of other venous thrombosis and embolism: Secondary | ICD-10-CM | POA: Diagnosis not present

## 2023-06-18 HISTORY — PX: TOTAL KNEE ARTHROPLASTY: SHX125

## 2023-06-18 LAB — GLUCOSE, CAPILLARY
Glucose-Capillary: 151 mg/dL — ABNORMAL HIGH (ref 70–99)
Glucose-Capillary: 160 mg/dL — ABNORMAL HIGH (ref 70–99)
Glucose-Capillary: 125 mg/dL — ABNORMAL HIGH (ref 70–99)
Glucose-Capillary: 164 mg/dL — ABNORMAL HIGH (ref 70–99)

## 2023-06-18 SURGERY — ARTHROPLASTY, KNEE, TOTAL
Anesthesia: Spinal | Site: Knee | Laterality: Right

## 2023-06-18 MED ORDER — INSULIN ASPART 100 UNIT/ML IJ SOLN
0.0000 [IU] | Freq: Three times a day (TID) | INTRAMUSCULAR | Status: DC
  Administered 2023-06-19: 3 [IU] via SUBCUTANEOUS
  Administered 2023-06-19 – 2023-06-20 (×3): 2 [IU] via SUBCUTANEOUS

## 2023-06-18 MED ORDER — PHENYLEPHRINE HCL (PRESSORS) 10 MG/ML IV SOLN
INTRAVENOUS | Status: AC
  Filled 2023-06-18: qty 1

## 2023-06-18 MED ORDER — ROSUVASTATIN CALCIUM 20 MG PO TABS
40.0000 mg | ORAL_TABLET | Freq: Every day | ORAL | Status: DC
  Administered 2023-06-18 – 2023-06-19 (×2): 40 mg via ORAL
  Filled 2023-06-18 (×3): qty 2

## 2023-06-18 MED ORDER — TRANEXAMIC ACID 1000 MG/10ML IV SOLN
2000.0000 mg | Freq: Once | INTRAVENOUS | Status: DC
  Filled 2023-06-18: qty 20

## 2023-06-18 MED ORDER — CHLORHEXIDINE GLUCONATE 0.12 % MT SOLN
15.0000 mL | Freq: Once | OROMUCOSAL | Status: AC
  Administered 2023-06-18: 15 mL via OROMUCOSAL

## 2023-06-18 MED ORDER — INSULIN ASPART 100 UNIT/ML IJ SOLN: 0.0000 [IU] | INTRAMUSCULAR | Status: DC | PRN

## 2023-06-18 MED ORDER — EPHEDRINE 5 MG/ML INJ
INTRAVENOUS | Status: AC
  Filled 2023-06-18: qty 10

## 2023-06-18 MED ORDER — SODIUM CHLORIDE 0.9 % IR SOLN
Status: DC | PRN
  Administered 2023-06-18: 1000 mL

## 2023-06-18 MED ORDER — ONDANSETRON HCL 4 MG/2ML IJ SOLN
INTRAMUSCULAR | Status: AC
  Filled 2023-06-18: qty 2

## 2023-06-18 MED ORDER — APIXABAN 2.5 MG PO TABS
2.5000 mg | ORAL_TABLET | Freq: Two times a day (BID) | ORAL | Status: DC
  Administered 2023-06-19 – 2023-06-20 (×3): 2.5 mg via ORAL
  Filled 2023-06-18 (×3): qty 1

## 2023-06-18 MED ORDER — SODIUM CHLORIDE (PF) 0.9 % IJ SOLN
INTRAMUSCULAR | Status: DC | PRN
  Administered 2023-06-18: 80 mL

## 2023-06-18 MED ORDER — CEFAZOLIN SODIUM-DEXTROSE 2-4 GM/100ML-% IV SOLN
2.0000 g | Freq: Four times a day (QID) | INTRAVENOUS | Status: AC
  Administered 2023-06-18 – 2023-06-19 (×2): 2 g via INTRAVENOUS
  Filled 2023-06-18 (×2): qty 100

## 2023-06-18 MED ORDER — ROPIVACAINE HCL 7.5 MG/ML IJ SOLN
INTRAMUSCULAR | Status: DC | PRN
Start: 2023-06-18 — End: 2023-06-18
  Administered 2023-06-18: 20 mL via PERINEURAL

## 2023-06-18 MED ORDER — ONDANSETRON HCL 4 MG/2ML IJ SOLN: 4.0000 mg | Freq: Four times a day (QID) | INTRAMUSCULAR | Status: DC | PRN

## 2023-06-18 MED ORDER — LACTATED RINGERS IV SOLN: INTRAVENOUS | Status: DC

## 2023-06-18 MED ORDER — METOPROLOL TARTRATE 25 MG PO TABS
25.0000 mg | ORAL_TABLET | ORAL | Status: AC
  Administered 2023-06-18: 25 mg via ORAL
  Filled 2023-06-18: qty 1

## 2023-06-18 MED ORDER — ACETAMINOPHEN 500 MG PO TABS
1000.0000 mg | ORAL_TABLET | Freq: Four times a day (QID) | ORAL | Status: AC
  Administered 2023-06-18 – 2023-06-19 (×3): 1000 mg via ORAL
  Filled 2023-06-18 (×2): qty 2

## 2023-06-18 MED ORDER — DIPHENHYDRAMINE HCL 12.5 MG/5ML PO ELIX: 12.5000 mg | ORAL_SOLUTION | ORAL | Status: DC | PRN

## 2023-06-18 MED ORDER — DEXAMETHASONE SODIUM PHOSPHATE 10 MG/ML IJ SOLN
INTRAMUSCULAR | Status: AC
  Filled 2023-06-18: qty 1

## 2023-06-18 MED ORDER — BISACODYL 10 MG RE SUPP: 10.0000 mg | Freq: Every day | RECTAL | Status: DC | PRN

## 2023-06-18 MED ORDER — SODIUM CHLORIDE (PF) 0.9 % IJ SOLN
INTRAMUSCULAR | Status: AC
  Filled 2023-06-18: qty 10

## 2023-06-18 MED ORDER — FENTANYL CITRATE PF 50 MCG/ML IJ SOSY
100.0000 ug | PREFILLED_SYRINGE | Freq: Once | INTRAMUSCULAR | Status: AC
  Administered 2023-06-18: 100 ug via INTRAVENOUS
  Filled 2023-06-18: qty 2

## 2023-06-18 MED ORDER — METOCLOPRAMIDE HCL 5 MG PO TABS: 5.0000 mg | ORAL_TABLET | Freq: Three times a day (TID) | ORAL | Status: DC | PRN

## 2023-06-18 MED ORDER — BUPIVACAINE LIPOSOME 1.3 % IJ SUSP: 20.0000 mL | Freq: Once | INTRAMUSCULAR | Status: DC

## 2023-06-18 MED ORDER — METHOCARBAMOL 500 MG IVPB - SIMPLE MED: 500.0000 mg | Freq: Four times a day (QID) | INTRAVENOUS | Status: DC | PRN

## 2023-06-18 MED ORDER — 0.9 % SODIUM CHLORIDE (POUR BTL) OPTIME
TOPICAL | Status: DC | PRN
  Administered 2023-06-18: 1000 mL

## 2023-06-18 MED ORDER — ACETAMINOPHEN 10 MG/ML IV SOLN
1000.0000 mg | Freq: Four times a day (QID) | INTRAVENOUS | Status: DC
  Administered 2023-06-18: 1000 mg via INTRAVENOUS
  Filled 2023-06-18: qty 100

## 2023-06-18 MED ORDER — BUPIVACAINE LIPOSOME 1.3 % IJ SUSP
INTRAMUSCULAR | Status: AC
  Filled 2023-06-18: qty 20

## 2023-06-18 MED ORDER — TRANEXAMIC ACID 1000 MG/10ML IV SOLN
INTRAVENOUS | Status: DC | PRN
  Administered 2023-06-18: 2000 mg via TOPICAL

## 2023-06-18 MED ORDER — SODIUM CHLORIDE (PF) 0.9 % IJ SOLN
INTRAMUSCULAR | Status: AC
  Filled 2023-06-18: qty 50

## 2023-06-18 MED ORDER — DEXAMETHASONE SODIUM PHOSPHATE 10 MG/ML IJ SOLN
8.0000 mg | Freq: Once | INTRAMUSCULAR | Status: AC
  Administered 2023-06-18: 8 mg via INTRAVENOUS

## 2023-06-18 MED ORDER — ASPIRIN 81 MG PO TBEC
81.0000 mg | DELAYED_RELEASE_TABLET | Freq: Every day | ORAL | Status: DC
  Administered 2023-06-19 – 2023-06-20 (×2): 81 mg via ORAL
  Filled 2023-06-18 (×2): qty 1

## 2023-06-18 MED ORDER — MEPERIDINE HCL 50 MG/ML IJ SOLN: 6.2500 mg | INTRAMUSCULAR | Status: DC | PRN

## 2023-06-18 MED ORDER — PHENOL 1.4 % MT LIQD: 1.0000 | OROMUCOSAL | Status: DC | PRN

## 2023-06-18 MED ORDER — DOCUSATE SODIUM 100 MG PO CAPS
100.0000 mg | ORAL_CAPSULE | Freq: Two times a day (BID) | ORAL | Status: DC
  Administered 2023-06-18 – 2023-06-20 (×4): 100 mg via ORAL
  Filled 2023-06-18 (×4): qty 1

## 2023-06-18 MED ORDER — METOPROLOL TARTRATE 25 MG PO TABS
25.0000 mg | ORAL_TABLET | Freq: Two times a day (BID) | ORAL | Status: DC
  Administered 2023-06-18 – 2023-06-20 (×4): 25 mg via ORAL
  Filled 2023-06-18 (×4): qty 1

## 2023-06-18 MED ORDER — MIRABEGRON ER 25 MG PO TB24
25.0000 mg | ORAL_TABLET | Freq: Every day | ORAL | Status: DC
  Administered 2023-06-19 – 2023-06-20 (×2): 25 mg via ORAL
  Filled 2023-06-18 (×2): qty 1

## 2023-06-18 MED ORDER — MIDAZOLAM HCL 2 MG/2ML IJ SOLN: 0.5000 mg | Freq: Once | INTRAMUSCULAR | Status: DC | PRN

## 2023-06-18 MED ORDER — METOCLOPRAMIDE HCL 5 MG/ML IJ SOLN: 5.0000 mg | Freq: Three times a day (TID) | INTRAMUSCULAR | Status: DC | PRN

## 2023-06-18 MED ORDER — PROPOFOL 1000 MG/100ML IV EMUL
INTRAVENOUS | Status: AC
  Filled 2023-06-18: qty 100

## 2023-06-18 MED ORDER — DULOXETINE HCL 60 MG PO CPEP
60.0000 mg | ORAL_CAPSULE | Freq: Every day | ORAL | Status: DC
  Administered 2023-06-18 – 2023-06-19 (×2): 60 mg via ORAL
  Filled 2023-06-18 (×2): qty 1

## 2023-06-18 MED ORDER — GLYCOPYRROLATE 0.2 MG/ML IJ SOLN
INTRAMUSCULAR | Status: DC | PRN
Start: 2023-06-18 — End: 2023-06-18
  Administered 2023-06-18 (×2): .1 mg via INTRAVENOUS

## 2023-06-18 MED ORDER — ONDANSETRON HCL 4 MG PO TABS: 4.0000 mg | ORAL_TABLET | Freq: Four times a day (QID) | ORAL | Status: DC | PRN

## 2023-06-18 MED ORDER — DAPAGLIFLOZIN PROPANEDIOL 5 MG PO TABS
5.0000 mg | ORAL_TABLET | Freq: Every day | ORAL | Status: DC
  Administered 2023-06-19 – 2023-06-20 (×2): 5 mg via ORAL
  Filled 2023-06-18 (×2): qty 1

## 2023-06-18 MED ORDER — HYDROMORPHONE HCL 1 MG/ML IJ SOLN
0.5000 mg | INTRAMUSCULAR | Status: DC | PRN
  Administered 2023-06-19 – 2023-06-20 (×4): 1 mg via INTRAVENOUS
  Filled 2023-06-18 (×4): qty 1

## 2023-06-18 MED ORDER — CEFAZOLIN SODIUM-DEXTROSE 2-4 GM/100ML-% IV SOLN
2.0000 g | INTRAVENOUS | Status: AC
  Administered 2023-06-18: 2 g via INTRAVENOUS
  Filled 2023-06-18: qty 100

## 2023-06-18 MED ORDER — METHOCARBAMOL 500 MG PO TABS
500.0000 mg | ORAL_TABLET | Freq: Four times a day (QID) | ORAL | Status: DC | PRN
  Administered 2023-06-19 – 2023-06-20 (×4): 500 mg via ORAL
  Filled 2023-06-18 (×4): qty 1

## 2023-06-18 MED ORDER — HYDROMORPHONE HCL 2 MG PO TABS
1.0000 mg | ORAL_TABLET | ORAL | Status: DC | PRN
  Administered 2023-06-18 – 2023-06-20 (×7): 2 mg via ORAL
  Filled 2023-06-18 (×7): qty 1

## 2023-06-18 MED ORDER — MENTHOL 3 MG MT LOZG: 1.0000 | LOZENGE | OROMUCOSAL | Status: DC | PRN

## 2023-06-18 MED ORDER — LOSARTAN POTASSIUM 50 MG PO TABS
50.0000 mg | ORAL_TABLET | Freq: Every day | ORAL | Status: DC
  Administered 2023-06-19 – 2023-06-20 (×2): 50 mg via ORAL
  Filled 2023-06-18 (×2): qty 1

## 2023-06-18 MED ORDER — EPHEDRINE SULFATE-NACL 50-0.9 MG/10ML-% IV SOSY
PREFILLED_SYRINGE | INTRAVENOUS | Status: DC | PRN
Start: 2023-06-18 — End: 2023-06-18
  Administered 2023-06-18: 5 mg via INTRAVENOUS

## 2023-06-18 MED ORDER — STERILE WATER FOR IRRIGATION IR SOLN
Status: DC | PRN
  Administered 2023-06-18: 1000 mL

## 2023-06-18 MED ORDER — FLEET ENEMA RE ENEM: 1.0000 | ENEMA | Freq: Once | RECTAL | Status: DC | PRN

## 2023-06-18 MED ORDER — HYDROMORPHONE HCL 1 MG/ML IJ SOLN: 0.2500 mg | INTRAMUSCULAR | Status: DC | PRN

## 2023-06-18 MED ORDER — ORAL CARE MOUTH RINSE: 15.0000 mL | Freq: Once | OROMUCOSAL | Status: AC

## 2023-06-18 MED ORDER — PHENYLEPHRINE HCL-NACL 20-0.9 MG/250ML-% IV SOLN
INTRAVENOUS | Status: DC | PRN
Start: 2023-06-18 — End: 2023-06-18
  Administered 2023-06-18: 30 ug/min via INTRAVENOUS

## 2023-06-18 MED ORDER — PROPOFOL 500 MG/50ML IV EMUL
INTRAVENOUS | Status: DC | PRN
  Administered 2023-06-18: 20 mg via INTRAVENOUS
  Administered 2023-06-18: 100 ug/kg/min via INTRAVENOUS

## 2023-06-18 MED ORDER — POLYETHYLENE GLYCOL 3350 17 G PO PACK: 17.0000 g | PACK | Freq: Every day | ORAL | Status: DC | PRN

## 2023-06-18 MED ORDER — POVIDONE-IODINE 10 % EX SWAB: 2.0000 | Freq: Once | CUTANEOUS | Status: DC

## 2023-06-18 MED ORDER — SODIUM CHLORIDE 0.9 % IV SOLN: INTRAVENOUS | Status: DC

## 2023-06-18 SURGICAL SUPPLY — 56 items
ADH SKN CLS APL DERMABOND .7 (GAUZE/BANDAGES/DRESSINGS) ×1
ATTUNE MED DOME PAT 41 KNEE (Knees) IMPLANT
ATTUNE PS FEM RT SZ 8 CEM KNEE (Femur) IMPLANT
ATTUNE PSRP INSR SZ8 7 KNEE (Insert) IMPLANT
BAG COUNTER SPONGE SURGICOUNT (BAG) IMPLANT
BAG SPEC THK2 15X12 ZIP CLS (MISCELLANEOUS) ×1
BAG SPNG CNTER NS LX DISP (BAG)
BAG ZIPLOCK 12X15 (MISCELLANEOUS) ×1 IMPLANT
BASE TIBIAL ROT PLAT SZ 8 KNEE (Knees) IMPLANT
BLADE SAG 18X100X1.27 (BLADE) ×1 IMPLANT
BLADE SAW SGTL 11.0X1.19X90.0M (BLADE) ×1 IMPLANT
BNDG CMPR 6 X 5 YARDS HK CLSR (GAUZE/BANDAGES/DRESSINGS) ×1
BNDG ELASTIC 6INX 5YD STR LF (GAUZE/BANDAGES/DRESSINGS) ×1 IMPLANT
BOWL SMART MIX CTS (DISPOSABLE) ×1 IMPLANT
BSPLAT TIB 8 CMNT ROT PLAT STR (Knees) ×1 IMPLANT
CEMENT HV SMART SET (Cement) ×2 IMPLANT
COVER SURGICAL LIGHT HANDLE (MISCELLANEOUS) ×1 IMPLANT
CUFF TOURN SGL QUICK 34 (TOURNIQUET CUFF) ×1
CUFF TRNQT CYL 34X4.125X (TOURNIQUET CUFF) ×1 IMPLANT
DERMABOND ADVANCED .7 DNX12 (GAUZE/BANDAGES/DRESSINGS) ×1 IMPLANT
DRAPE U-SHAPE 47X51 STRL (DRAPES) ×1 IMPLANT
DRESSING AQUACEL AG SP 3.5X10 (GAUZE/BANDAGES/DRESSINGS) IMPLANT
DRSG AQUACEL AG ADV 3.5X10 (GAUZE/BANDAGES/DRESSINGS) ×1 IMPLANT
DRSG AQUACEL AG SP 3.5X10 (GAUZE/BANDAGES/DRESSINGS) ×1
DURAPREP 26ML APPLICATOR (WOUND CARE) ×1 IMPLANT
ELECT REM PT RETURN 15FT ADLT (MISCELLANEOUS) ×1 IMPLANT
GLOVE BIO SURGEON STRL SZ 6.5 (GLOVE) IMPLANT
GLOVE BIO SURGEON STRL SZ8 (GLOVE) ×1 IMPLANT
GLOVE BIOGEL PI IND STRL 6.5 (GLOVE) IMPLANT
GLOVE BIOGEL PI IND STRL 7.0 (GLOVE) IMPLANT
GLOVE BIOGEL PI IND STRL 8 (GLOVE) ×1 IMPLANT
GOWN STRL REUS W/ TWL LRG LVL3 (GOWN DISPOSABLE) ×1 IMPLANT
GOWN STRL REUS W/TWL LRG LVL3 (GOWN DISPOSABLE) ×1
HANDPIECE INTERPULSE COAX TIP (DISPOSABLE) ×1
HOLDER FOLEY CATH W/STRAP (MISCELLANEOUS) IMPLANT
IMMOBILIZER KNEE 20 (SOFTGOODS) ×1
IMMOBILIZER KNEE 20 THIGH 36 (SOFTGOODS) ×1 IMPLANT
KIT TURNOVER KIT A (KITS) IMPLANT
MANIFOLD NEPTUNE II (INSTRUMENTS) ×1 IMPLANT
NS IRRIG 1000ML POUR BTL (IV SOLUTION) ×1 IMPLANT
PACK TOTAL KNEE CUSTOM (KITS) ×1 IMPLANT
PADDING CAST ABS COTTON 6X4 NS (CAST SUPPLIES) IMPLANT
PADDING CAST COTTON 6X4 STRL (CAST SUPPLIES) ×2 IMPLANT
PIN STEINMAN FIXATION KNEE (PIN) IMPLANT
PROTECTOR NERVE ULNAR (MISCELLANEOUS) ×1 IMPLANT
SET HNDPC FAN SPRY TIP SCT (DISPOSABLE) ×1 IMPLANT
SUT MNCRL AB 4-0 PS2 18 (SUTURE) ×1 IMPLANT
SUT STRATAFIX 0 PDS 27 VIOLET (SUTURE) ×1
SUT VIC AB 2-0 CT1 27 (SUTURE) ×3
SUT VIC AB 2-0 CT1 TAPERPNT 27 (SUTURE) ×3 IMPLANT
SUTURE STRATFX 0 PDS 27 VIOLET (SUTURE) ×1 IMPLANT
TIBIAL BASE ROT PLAT SZ 8 KNEE (Knees) ×1 IMPLANT
TRAY FOLEY MTR SLVR 16FR STAT (SET/KITS/TRAYS/PACK) IMPLANT
TUBE SUCTION HIGH CAP CLEAR NV (SUCTIONS) ×1 IMPLANT
WATER STERILE IRR 1000ML POUR (IV SOLUTION) ×2 IMPLANT
WRAP KNEE MAXI GEL POST OP (GAUZE/BANDAGES/DRESSINGS) ×1 IMPLANT

## 2023-06-18 NOTE — Discharge Instructions (Addendum)
Kirk Gross, MD Total Joint Specialist [facility] [address] [place], [place] [address] [phone]  TOTAL KNEE REPLACEMENT POSTOPERATIVE DIRECTIONS    Knee Rehabilitation, Guidelines Following Surgery  Results after knee surgery are often greatly improved when you follow the exercise, range of motion and muscle strengthening exercises prescribed by your doctor. Safety measures are also important to protect the knee from further injury. If any of these exercises cause you to have increased pain or swelling in your knee joint, decrease the amount until you are comfortable again and slowly increase them. If you have problems or questions, call your caregiver or physical therapist for advice.   BLOOD CLOT PREVENTION Resume your 2.5 mg Eliquis twice daily and 81mg  Aspirin once daily following surgery. You may resume your vitamins/supplements upon discharge from the hospital. Do not take any NSAIDs (Advil, Aleve, Ibuprofen, Meloxicam, etc.) while taking Eliquis.     HOME CARE INSTRUCTIONS  Remove items at home which could result in a fall. This includes throw rugs or furniture in walking pathways.  ICE to the affected knee as much as tolerated. Icing helps control swelling. If the swelling is well controlled you will be more comfortable and rehab easier. Continue to use ice on the knee for pain and swelling from surgery. You may notice swelling that will progress down to the foot and ankle. This is normal after surgery. Elevate the leg when you are not up walking on it.    Continue to use the breathing machine which will help keep your temperature down. It is common for your temperature to cycle up and down following surgery, especially at night when you are not up moving around and exerting yourself. The breathing machine keeps your lungs expanded and your temperature down. Do not place pillow under the operative knee, focus on keeping the knee straight while  resting  DIET You may resume your previous home diet once you are discharged from the hospital.  DRESSING / WOUND CARE / SHOWERING Keep your bulky bandage on for 2 days. On the third post-operative day you may remove the Ace bandage and gauze. There is a waterproof adhesive bandage on your skin which will stay in place until your first follow-up appointment. Once you remove this you will not need to place another bandage You may begin showering 3 days following surgery, but do not submerge the incision under water.  ACTIVITY For the first 5 days, the key is rest and control of pain and swelling Do your home exercises twice a day starting on post-operative day 3. On the days you go to physical therapy, just do the home exercises once that day. You should rest, ice and elevate the leg for 50 minutes out of every hour. Get up and walk/stretch for 10 minutes per hour. After 5 days you can increase your activity slowly as tolerated. Walk with your walker as instructed. Use the walker until you are comfortable transitioning to a cane. Walk with the cane in the opposite hand of the operative leg. You may discontinue the cane once you are comfortable and walking steadily. Avoid periods of inactivity such as sitting longer than an hour when not asleep. This helps prevent blood clots.  You may discontinue the knee immobilizer once you are able to perform a straight leg raise while lying down. You may resume a sexual relationship in one month or when given the OK by your doctor.  You may return to work once you are  cleared by your doctor.  Do not drive a car for 6 weeks or until released by your surgeon.  Do not drive while taking narcotics.  TED HOSE STOCKINGS Wear the elastic stockings on both legs for three weeks following surgery during the day. You may remove them at night for sleeping.  WEIGHT BEARING Weight bearing as tolerated with assist device (walker, cane, etc) as directed, use it as long  as suggested by your surgeon or therapist, typically at least 4-6 weeks.  POSTOPERATIVE CONSTIPATION PROTOCOL Constipation - defined medically as fewer than three stools per week and severe constipation as less than one stool per week.  One of the most common issues patients have following surgery is constipation.  Even if you have a regular bowel pattern at home, your normal regimen is likely to be disrupted due to multiple reasons following surgery.  Combination of anesthesia, postoperative narcotics, change in appetite and fluid intake all can affect your bowels.  In order to avoid complications following surgery, here are some recommendations in order to help you during your recovery period.  Colace (docusate) - Pick up an over-the-counter form of Colace or another stool softener and take twice a day as long as you are requiring postoperative pain medications.  Take with a full glass of water daily.  If you experience loose stools or diarrhea, hold the colace until you stool forms back up. If your symptoms do not get better within 1 week or if they get worse, check with your doctor. Dulcolax (bisacodyl) - Pick up over-the-counter and take as directed by the product packaging as needed to assist with the movement of your bowels.  Take with a full glass of water.  Use this product as needed if not relieved by Colace only.  MiraLax (polyethylene glycol) - Pick up over-the-counter to have on hand. MiraLax is a solution that will increase the amount of water in your bowels to assist with bowel movements.  Take as directed and can mix with a glass of water, juice, soda, coffee, or tea. Take if you go more than two days without a movement. Do not use MiraLax more than once per day. Call your doctor if you are still constipated or irregular after using this medication for 7 days in a row.  If you continue to have problems with postoperative constipation, please contact the office for further assistance and  recommendations.  If you experience "the worst abdominal pain ever" or develop nausea or vomiting, please contact the office immediatly for further recommendations for treatment.  ITCHING If you experience itching with your medications, try taking only a single pain pill, or even half a pain pill at a time.  You can also use Benadryl over the counter for itching or also to help with sleep.   MEDICATIONS See your medication summary on the "After Visit Summary" that the nursing staff will review with you prior to discharge.  You may have some home medications which will be placed on hold until you complete the course of blood thinner medication.  It is important for you to complete the blood thinner medication as prescribed by your surgeon.  Continue your approved medications as instructed at time of discharge.  PRECAUTIONS If you experience chest pain or shortness of breath - call 911 immediately for transfer to the hospital emergency department.  If you develop a fever greater that 101 F, purulent drainage from wound, increased redness or drainage from wound, foul odor from the wound/dressing, or calf  pain - CONTACT YOUR SURGEON.                                                   FOLLOW-UP APPOINTMENTS Make sure you keep all of your appointments after your operation with your surgeon and caregivers. You should call the office at the above phone number and make an appointment for approximately two weeks after the date of your surgery or on the date instructed by your surgeon outlined in the "After Visit Summary".  RANGE OF MOTION AND STRENGTHENING EXERCISES  Rehabilitation of the knee is important following a knee injury or an operation. After just a few days of immobilization, the muscles of the thigh which control the knee become weakened and shrink (atrophy). Knee exercises are designed to build up the tone and strength of the thigh muscles and to improve knee motion. Often times heat used for  twenty to thirty minutes before working out will loosen up your tissues and help with improving the range of motion but do not use heat for the first two weeks following surgery. These exercises can be done on a training (exercise) mat, on the floor, on a table or on a bed. Use what ever works the best and is most comfortable for you Knee exercises include:  Leg Lifts - While your knee is still immobilized in a splint or cast, you can do straight leg raises. Lift the leg to 60 degrees, hold for 3 sec, and slowly lower the leg. Repeat 10-20 times 2-3 times daily. Perform this exercise against resistance later as your knee gets better.  Quad and Hamstring Sets - Tighten up the muscle on the front of the thigh (Quad) and hold for 5-10 sec. Repeat this 10-20 times hourly. Hamstring sets are done by pushing the foot backward against an object and holding for 5-10 sec. Repeat as with quad sets.  Leg Slides: Lying on your back, slowly slide your foot toward your buttocks, bending your knee up off the floor (only go as far as is comfortable). Then slowly slide your foot back down until your leg is flat on the floor again. Angel Wings: Lying on your back spread your legs to the side as far apart as you can without causing discomfort.  A rehabilitation program following serious knee injuries can speed recovery and prevent re-injury in the future due to weakened muscles. Contact your doctor or a physical therapist for more information on knee rehabilitation.   POST-OPERATIVE OPIOID TAPER INSTRUCTIONS: It is important to wean off of your opioid medication as soon as possible. If you do not need pain medication after your surgery it is ok to stop day one. Opioids include: Codeine, Hydrocodone(Norco, Vicodin), Oxycodone(Percocet, oxycontin) and hydromorphone amongst others.  Long term and even short term use of opiods can cause: Increased pain response Dependence Constipation Depression Respiratory depression And  more.  Withdrawal symptoms can include Flu like symptoms Nausea, vomiting And more Techniques to manage these symptoms Hydrate well Eat regular healthy meals Stay active Use relaxation techniques(deep breathing, meditating, yoga) Do Not substitute Alcohol to help with tapering If you have been on opioids for less than two weeks and do not have pain than it is ok to stop all together.  Plan to wean off of opioids This plan should start within one week post op of your joint  replacement. Maintain the same interval or time between taking each dose and first decrease the dose.  Cut the total daily intake of opioids by one tablet each day Next start to increase the time between doses. The last dose that should be eliminated is the evening dose.   IF YOU ARE TRANSFERRED TO A SKILLED REHAB FACILITY If the patient is transferred to a skilled rehab facility following release from the hospital, a list of the current medications will be sent to the facility for the patient to continue.  When discharged from the skilled rehab facility, please have the facility set up the patient's Home Health Physical Therapy prior to being released. Also, the skilled facility will be responsible for providing the patient with their medications at time of release from the facility to include their pain medication, the muscle relaxants, and their blood thinner medication. If the patient is still at the rehab facility at time of the two week follow up appointment, the skilled rehab facility will also need to assist the patient in arranging follow up appointment in our office and any transportation needs.  MAKE SURE YOU:  Understand these instructions.  Get help right away if you are not doing well or get worse.   DENTAL ANTIBIOTICS:  In most cases prophylactic antibiotics for Dental procdeures after total joint surgery are not necessary.  Exceptions are as follows:  1. History of prior total joint infection  2.  Severely immunocompromised (Organ Transplant, cancer chemotherapy, Rheumatoid biologic meds such as Humera)  3. Poorly controlled diabetes (A1C &gt; 8.0, blood glucose over 200)  If you have one of these conditions, contact your surgeon for an antibiotic prescription, prior to your dental procedure.    Pick up stool softner and laxative for home use following surgery while on pain medications. Do not submerge incision under water. Please use good hand washing techniques while changing dressing each day. May shower starting three days after surgery. Please use a clean towel to pat the incision dry following showers. Continue to use ice for pain and swelling after surgery. Do not use any lotions or creams on the incision until instructed by your surgeon.

## 2023-06-18 NOTE — Interval H&P Note (Signed)
History and Physical Interval Note:  06/18/2023 10:36 AM  Kirk [name]  has presented today for surgery, with the diagnosis of right knee osteoarthritis.  The various methods of treatment have been discussed with the patient and family. After consideration of risks, benefits and other options for treatment, the patient has consented to  Procedure(s): TOTAL KNEE ARTHROPLASTY (Right) as a surgical intervention.  The patient's history has been reviewed, patient examined, no change in status, stable for surgery.  I have reviewed the patient's chart and labs.  Questions were answered to the patient's satisfaction.     Kirk [name]

## 2023-06-18 NOTE — Transfer of Care (Signed)
Immediate Anesthesia Transfer of Care Note  Patient: Kirk [name]  Procedure(s) Performed: RIGHT TOTAL KNEE ARTHROPLASTY (Right: Knee)  Patient Location: PACU  Anesthesia Type:Spinal  Level of Consciousness: sedated  Airway & Oxygen Therapy: Patient Spontanous Breathing and Patient connected to face mask oxygen  Post-op Assessment: Report given to RN  Post vital signs: stable  Last Vitals:  Vitals Value Taken Time  BP 99/60 06/18/23 1419  Temp    Pulse 77 06/18/23 1420  Resp 14 06/18/23 1420  SpO2 91 % 06/18/23 1420  Vitals shown include unfiled device data.  Last Pain:  Vitals:   06/18/23 1031  TempSrc: Oral  PainSc: 0-No pain      Patients Stated Pain Goal: 4 (06/18/23 1031)  Complications: No notable events documented.

## 2023-06-18 NOTE — Anesthesia Procedure Notes (Addendum)
Anesthesia Regional Block: Adductor canal block   Pre-Anesthetic Checklist: , timeout performed,  Correct Patient, Correct Site, Correct Laterality,  Correct Procedure, Correct Position, site marked,  Risks and benefits discussed,  Surgical consent,  Pre-op evaluation,  At surgeon's request and post-op pain management  Laterality: Right and Lower  Prep: chloraprep       Needles:  Injection technique: Single-shot  Needle Type: Echogenic Needle     Needle Length: 9cm  Needle Gauge: 21     Additional Needles:   Procedures:,,,, ultrasound used (permanent image in chart),,    Narrative:  Start time: 06/18/2023 12:25 PM End time: 06/18/2023 12:31 PM Injection made incrementally with aspirations every 5 mL.  Performed by: Personally  Anesthesiologist: Jairo Ben, MD  Additional Notes: Pt identified in Holding room.  Monitors applied. Working IV access confirmed. Timeout, Sterile prep R thigh.  #21ga ECHOgenic Arrow block needle into adductor canal with US guidance.  20cc 0.75% Ropivacaine injected incrementally after negative test dose.  Patient asymptomatic, VSS, no heme aspirated, tolerated well.   Sandford Craze, MD

## 2023-06-18 NOTE — Op Note (Signed)
OPERATIVE REPORT-TOTAL KNEE ARTHROPLASTY   Pre-operative diagnosis- Osteoarthritis  Right knee(s)  Post-operative diagnosis- Osteoarthritis Right knee(s)  Procedure-  Right  Total Knee Arthroplasty  Surgeon- Gus Rankin. Charlena Haub, MD  Assistant- Arther Abbott, PA-C   Anesthesia-   Adductor canal block and spinal  EBL-25 ml   Drains None  Tourniquet time-  Total Tourniquet Time Documented: Thigh (Right) - 35 minutes Total: Thigh (Right) - 35 minutes     Complications- None  Condition-PACU - hemodynamically stable.   Brief Clinical Note  Kirk [name] is a 77 y.o. year old male with end stage OA of his right knee with progressively worsening pain and dysfunction. He has constant pain, with activity and at rest and significant functional deficits with difficulties even with ADLs. He has had extensive non-op management including analgesics, injections of cortisone and viscosupplements, and home exercise program, but remains in significant pain with significant dysfunction. Radiographs show bone on bone arthritis medial and patellofemoral. He presents now for right Total Knee Arthroplasty.     Procedure in detail---   The patient is brought into the operating room and positioned supine on the operating table. After successful administration of  Adductor canal block and spinal,   a tourniquet is placed high on the  Right thigh(s) and the lower extremity is prepped and draped in the usual sterile fashion. Time out is performed by the operating team and then the  Right lower extremity is wrapped in Esmarch, knee flexed and the tourniquet inflated to 300 mmHg.       A midline incision is made with a ten blade through the subcutaneous tissue to the level of the extensor mechanism. A fresh blade is used to make a medial parapatellar arthrotomy. Soft tissue over the proximal medial tibia is subperiosteally elevated to the joint line with a knife and into the semimembranosus bursa with a Cobb  elevator. Soft tissue over the proximal lateral tibia is elevated with attention being paid to avoiding the patellar tendon on the tibial tubercle. The patella is everted, knee flexed 90 degrees and the ACL and PCL are removed. Findings are bone on bone medial and patellofemoral with large global osteophytes        The drill is used to create a starting hole in the distal femur and the canal is thoroughly irrigated with sterile saline to remove the fatty contents. The 5 degree Right  valgus alignment guide is placed into the femoral canal and the distal femoral cutting block is pinned to remove 9 mm off the distal femur. Resection is made with an oscillating saw.      The tibia is subluxed forward and the menisci are removed. The extramedullary alignment guide is placed referencing proximally at the medial aspect of the tibial tubercle and distally along the second metatarsal axis and tibial crest. The block is pinned to remove 2mm off the more deficient medial  side. Resection is made with an oscillating saw. Size 8is the most appropriate size for the tibia and the proximal tibia is prepared with the modular drill and keel punch for that size.      The femoral sizing guide is placed and size 8 is most appropriate. Rotation is marked off the epicondylar axis and confirmed by creating a rectangular flexion gap at 90 degrees. The size 8 cutting block is pinned in this rotation and the anterior, posterior and chamfer cuts are made with the oscillating saw. The intercondylar block is then placed and that cut is  made.      Trial size 8 tibial component, trial size 8 posterior stabilized femur and a 7  mm posterior stabilized rotating platform insert trial is placed. Full extension is achieved with excellent varus/valgus and anterior/posterior balance throughout full range of motion. The patella is everted and thickness measured to be 27  mm. Free hand resection is taken to 15 mm, a 41 template is placed, lug holes  are drilled, trial patella is placed, and it tracks normally. Osteophytes are removed off the posterior femur with the trial in place. All trials are removed and the cut bone surfaces prepared with pulsatile lavage. Cement is mixed and once ready for implantation, the size 8 tibial implant, size  8 posterior stabilized femoral component, and the size 41 patella are cemented in place and the patella is held with the clamp. The trial insert is placed and the knee held in full extension. The Exparel (20 ml mixed with 60 ml saline) is injected into the extensor mechanism, posterior capsule, medial and lateral gutters and subcutaneous tissues.  All extruded cement is removed and once the cement is hard the permanent 7 mm posterior stabilized rotating platform insert is placed into the tibial tray.      The wound is copiously irrigated with saline solution and the extensor mechanism closed with # 0 Stratofix suture. The tourniquet is released for a total tourniquet time of 35  minutes. Flexion against gravity is 140 degrees and the patella tracks normally. Subcutaneous tissue is closed with 2.0 vicryl and subcuticular with running 4.0 Monocryl. The incision is cleaned and dried and steri-strips and a bulky sterile dressing are applied. The limb is placed into a knee immobilizer and the patient is awakened and transported to recovery in stable condition.      Please note that a surgical assistant was a medical necessity for this procedure in order to perform it in a safe and expeditious manner. Surgical assistant was necessary to retract the ligaments and vital neurovascular structures to prevent injury to them and also necessary for proper positioning of the limb to allow for anatomic placement of the prosthesis.   Gus Rankin Zyad Boomer, MD    06/18/2023, 1:59 PM

## 2023-06-18 NOTE — Anesthesia Postprocedure Evaluation (Signed)
Anesthesia Post Note  Patient: Kirk [name]  Procedure(s) Performed: RIGHT TOTAL KNEE ARTHROPLASTY (Right: Knee)     Patient location during evaluation: PACU Anesthesia Type: Spinal Level of consciousness: awake and alert, patient cooperative and oriented Pain management: pain level controlled Vital Signs Assessment: post-procedure vital signs reviewed and stable Respiratory status: nonlabored ventilation, spontaneous breathing and respiratory function stable Cardiovascular status: blood pressure returned to baseline and stable Postop Assessment: spinal receding and no apparent nausea or vomiting Anesthetic complications: no   No notable events documented.  Last Vitals:  Vitals:   06/18/23 1515 06/18/23 1530  BP: 99/62 101/60  Pulse: 68 60  Resp: 19 15  Temp:    SpO2: 95% 97%    Last Pain:  Vitals:   06/18/23 1530  TempSrc:   PainSc: 0-No pain                 Cyndy Braver,E. [name]

## 2023-06-18 NOTE — Progress Notes (Signed)
Orthopedic Tech Progress Note Patient Details:  Kirk [name] 09-25-1945 [id number]  CPM Right Knee CPM Right Knee: On Right Knee Flexion (Degrees): 40 Right Knee Extension (Degrees): 10  Post Interventions Patient Tolerated: Well  [name] 06/18/2023, 2:47 PM

## 2023-06-18 NOTE — Plan of Care (Signed)
  Problem: Education: Goal: Ability to describe self-care measures that may prevent or decrease complications (Diabetes Survival Skills Education) will improve Outcome: Progressing Goal: Individualized Educational Video(s) Outcome: Progressing   Problem: Coping: Goal: Ability to adjust to condition or change in health will improve Outcome: Progressing   

## 2023-06-18 NOTE — Progress Notes (Signed)
Orthopedic Tech Progress Note Patient Details:  Kirk [name] 02-06-1946 [id number]  CPM Right Knee CPM Right Knee: Off Right Knee Flexion (Degrees): 40 Right Knee Extension (Degrees): 10  Post Interventions Patient Tolerated: Well  [name] 06/18/2023, 6:29 PM

## 2023-06-19 ENCOUNTER — Encounter (HOSPITAL_COMMUNITY): Payer: Self-pay | Admitting: Orthopedic Surgery

## 2023-06-19 DIAGNOSIS — Z86711 Personal history of pulmonary embolism: Secondary | ICD-10-CM | POA: Diagnosis not present

## 2023-06-19 DIAGNOSIS — E039 Hypothyroidism, unspecified: Secondary | ICD-10-CM | POA: Diagnosis not present

## 2023-06-19 DIAGNOSIS — I509 Heart failure, unspecified: Secondary | ICD-10-CM | POA: Diagnosis not present

## 2023-06-19 DIAGNOSIS — Z7901 Long term (current) use of anticoagulants: Secondary | ICD-10-CM | POA: Diagnosis not present

## 2023-06-19 DIAGNOSIS — E119 Type 2 diabetes mellitus without complications: Secondary | ICD-10-CM | POA: Diagnosis not present

## 2023-06-19 DIAGNOSIS — M1711 Unilateral primary osteoarthritis, right knee: Secondary | ICD-10-CM | POA: Diagnosis not present

## 2023-06-19 DIAGNOSIS — Z85048 Personal history of other malignant neoplasm of rectum, rectosigmoid junction, and anus: Secondary | ICD-10-CM | POA: Diagnosis not present

## 2023-06-19 DIAGNOSIS — Z79899 Other long term (current) drug therapy: Secondary | ICD-10-CM | POA: Diagnosis not present

## 2023-06-19 DIAGNOSIS — I4891 Unspecified atrial fibrillation: Secondary | ICD-10-CM | POA: Diagnosis not present

## 2023-06-19 DIAGNOSIS — I110 Hypertensive heart disease with heart failure: Secondary | ICD-10-CM | POA: Diagnosis not present

## 2023-06-19 DIAGNOSIS — Z951 Presence of aortocoronary bypass graft: Secondary | ICD-10-CM | POA: Diagnosis not present

## 2023-06-19 DIAGNOSIS — I2510 Atherosclerotic heart disease of native coronary artery without angina pectoris: Secondary | ICD-10-CM | POA: Diagnosis not present

## 2023-06-19 DIAGNOSIS — Z86718 Personal history of other venous thrombosis and embolism: Secondary | ICD-10-CM | POA: Diagnosis not present

## 2023-06-19 LAB — BASIC METABOLIC PANEL
Anion gap: 8 (ref 5–15)
BUN: 14 mg/dL (ref 8–23)
CO2: 25 mmol/L (ref 22–32)
Calcium: 8.9 mg/dL (ref 8.9–10.3)
Chloride: 99 mmol/L (ref 98–111)
Creatinine, Ser: 1.19 mg/dL (ref 0.61–1.24)
GFR, Estimated: >60 mL/min (ref >60)
Glucose, Bld: 159 mg/dL — ABNORMAL HIGH (ref 70–99)
Potassium: 4.1 mmol/L (ref 3.5–5.1)
Sodium: 132 mmol/L — ABNORMAL LOW (ref 135–145)

## 2023-06-19 LAB — CBC
HCT: 46.5 % (ref 39.0–52.0)
Hemoglobin: 15 g/dL (ref 13.0–17.0)
MCH: 30.7 pg (ref 26.0–34.0)
MCHC: 32.3 g/dL (ref 30.0–36.0)
MCV: 95.3 fL (ref 80.0–100.0)
Platelets: 152 10*3/uL (ref 150–400)
RBC: 4.88 MIL/uL (ref 4.22–5.81)
RDW: 13.8 % (ref 11.5–15.5)
WBC: 15.9 10*3/uL — ABNORMAL HIGH (ref 4.0–10.5)
nRBC: 0 % (ref 0.0–0.2)

## 2023-06-19 LAB — GLUCOSE, CAPILLARY
Glucose-Capillary: 162 mg/dL — ABNORMAL HIGH (ref 70–99)
Glucose-Capillary: 135 mg/dL — ABNORMAL HIGH (ref 70–99)
Glucose-Capillary: 94 mg/dL (ref 70–99)
Glucose-Capillary: 137 mg/dL — ABNORMAL HIGH (ref 70–99)

## 2023-06-19 MED ORDER — HYDROMORPHONE HCL 2 MG PO TABS: 1.0000 mg | ORAL_TABLET | Freq: Four times a day (QID) | ORAL | 0 refills | Status: DC | PRN

## 2023-06-19 MED ORDER — ALUM & MAG HYDROXIDE-SIMETH 200-200-20 MG/5ML PO SUSP
15.0000 mL | Freq: Four times a day (QID) | ORAL | Status: DC | PRN
  Administered 2023-06-19: 15 mL via ORAL
  Filled 2023-06-19: qty 30

## 2023-06-19 MED ORDER — METHOCARBAMOL 500 MG PO TABS: 500.0000 mg | ORAL_TABLET | Freq: Four times a day (QID) | ORAL | 0 refills | Status: DC | PRN

## 2023-06-19 MED ORDER — ONDANSETRON HCL 4 MG PO TABS: 4.0000 mg | ORAL_TABLET | Freq: Four times a day (QID) | ORAL | 0 refills | Status: DC | PRN

## 2023-06-19 NOTE — TOC Transition Note (Signed)
Transition of Care [facility]) - CM/SW Discharge Note  Patient Details  Name: Kirk [name] MRN: [id number] Date of Birth: May 14, 1946  Transition of Care [facility]) CM/SW Contact:  Ewing Schlein, LCSW Phone Number: 06/19/2023, 10:10 AM  Clinical Narrative: Patient is expected to discharge home after working with PT. CSW met with patient and spouse, [name], to confirm discharge plan. Patient will go home with OPPT at [facility] in [place]. Patient will need a rolling walker, which [employer] delivered to patient's room. TOC signing off.  Final next level of care: OP Rehab Barriers to Discharge: No Barriers Identified  Patient Goals and CMS Choice CMS Medicare.gov Compare Post Acute Care list provided to:: Patient Choice offered to / list presented to : Patient  Discharge Plan and Services Additional resources added to the After Visit Summary for      DME Arranged: Walker rolling DME Agency: [employer] Representative spoke with at DME Agency: Prearranged in orthopedist's office  Social Determinants of Health (SDOH) Interventions SDOH Screenings   Food Insecurity: No Food Insecurity (06/18/2023)  Housing: Low Risk  (06/18/2023)  Transportation Needs: No Transportation Needs (06/18/2023)  Utilities: Not At Risk (06/18/2023)  Alcohol Screen: Low Risk  (02/06/2023)  Depression (PHQ2-9): Low Risk  (06/06/2023)  Recent Concern: Depression (PHQ2-9) - Medium Risk (04/25/2023)  Financial Resource Strain: Low Risk  (02/06/2023)  Physical Activity: Sufficiently Active (02/06/2023)  Social Connections: Socially Integrated (02/06/2023)  Stress: No Stress Concern Present (02/06/2023)  Tobacco Use: Low Risk  (06/18/2023)  Health Literacy: Adequate Health Literacy (06/06/2023)   Readmission Risk Interventions     No data to display

## 2023-06-19 NOTE — Evaluation (Signed)
Physical Therapy Evaluation Patient Details Name: Kirk [name] MRN: [id number] DOB: 04-Oct-1945 Today's Date: 06/19/2023  History of Present Illness  Pt is 77 yo male s/p R TKA on 06/18/23.  Pt with hx including but not limited to arthritis, R LE DVT, CKD, DM2, PE, anal CA, afib, CABG, MI, seizure, cervical fusion  Clinical Impression  Pt is s/p TKA resulting in the deficits listed below (see PT Problem List). At baseline, pt was independent and working until the last 2 months due to knee pain and "giving away."  Today, pt with good pain control , quad activation, and motivated.  He was able to ambulate 120' with RW and CGA.  Pt has 2 steps to enter his home but no rails - attempted multiple methods - mild instability, needs further practice and practice with wife assisting.  Will f/u in afternoon.   Pt will benefit from acute skilled PT to increase their independence and safety with mobility to allow discharge.          If plan is discharge home, recommend the following: A little help with walking and/or transfers;A little help with bathing/dressing/bathroom;Assistance with cooking/housework;Help with stairs or ramp for entrance   Can travel by private vehicle        Equipment Recommendations Rolling walker (2 wheels)  Recommendations for Other Services       Functional Status Assessment Patient has had a recent decline in their functional status and demonstrates the ability to make significant improvements in function in a reasonable and predictable amount of time.     Precautions / Restrictions Precautions Precautions: Fall;Knee Restrictions Weight Bearing Restrictions: Yes RLE Weight Bearing: Weight bearing as tolerated      Mobility  Bed Mobility Overal bed mobility: Needs Assistance Bed Mobility: Supine to Sit     Supine to sit: Supervision          Transfers Overall transfer level: Needs assistance Equipment used: Rolling walker (2 wheels) Transfers: Sit  to/from Stand Sit to Stand: Contact guard assist           General transfer comment: cues for hand placement    Ambulation/Gait Ambulation/Gait assistance: Contact guard assist Gait Distance (Feet): 120 Feet Assistive device: Rolling walker (2 wheels) Gait Pattern/deviations: Step-through pattern, Decreased stride length, Decreased weight shift to right Gait velocity: decreased but functional     General Gait Details: Initial step to pattern improving throughout; cues for RW proximity with good carry over.  Pt reports history of both knees "giving out" and causing falls.  Discussed improtance of maintaining good RW proximity to compensate with UE.  Stairs Stairs: Yes Stairs assistance: Contact guard assist, Min assist Stair Management: Step to pattern   General stair comments: Started with 3 steps (4") with bil rails and tolerated well.  Progressed to 3 steps with cane and HHA - required min A for stability.  Pt confident going up but needing increased support to descend.  Wheelchair Mobility     Tilt Bed    Modified Rankin (Stroke Patients Only)       Balance Overall balance assessment: Needs assistance Sitting-balance support: No upper extremity supported Sitting balance-Leahy Scale: Good     Standing balance support: Bilateral upper extremity supported, Reliant on assistive device for balance Standing balance-Leahy Scale: Poor Standing balance comment: Steady with RW                             Pertinent Vitals/Pain  Pain Assessment Pain Assessment: 0-10 Pain Score: 4  Pain Location: R knee Pain Descriptors / Indicators: Discomfort, Sore Pain Intervention(s): Limited activity within patient's tolerance, Monitored during session, Premedicated before session, Repositioned, Ice applied    Home Living Family/patient expects to be discharged to:: Private residence Living Arrangements: Spouse/significant other (wife , [name]) Available Help at  Discharge: Family;Available 24 hours/day Type of Home: House Home Access: Stairs to enter Entrance Stairs-Rails: None (reports can hold doorframe) Entrance Stairs-Number of Steps: 2   Home Layout: Two level;Able to live on main level with bedroom/bathroom Home Equipment: Crutches;Cane - single point;Grab bars - tub/shower;BSC/3in1;Shower seat;Toilet riser Additional Comments: RW has been delievered to room; (BSC, shower chair, toilet riser borrowed)    Prior Function Prior Level of Function : Independent/Modified Independent;Driving             Mobility Comments: Recently retired; Was ambulating without AD; Reports could walk in community 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  ROM: WFL, MMT: 5/5 throughout    Cervical / Trunk Assessment Cervical / Trunk Assessment: Normal  Communication      Cognition Arousal: Alert Behavior During Therapy: WFL for tasks assessed/performed Overall Cognitive Status: Within Functional Limits for tasks assessed                                 General Comments: Very kind, motivated, does need redirecting at times        General Comments      Exercises Total Joint Exercises Ankle Circles/Pumps: AROM, Both, 10 reps, Supine Quad Sets: AROM, Both, 10 reps, Supine   Assessment/Plan    PT Assessment Patient needs continued PT services  PT Problem List  Decreased strength;Pain;Decreased range of motion;Decreased activity tolerance;Decreased balance;Decreased mobility;Decreased safety awareness;Decreased knowledge of use of DME       PT Treatment Interventions DME instruction;Therapeutic exercise;Gait training;Balance training;Stair training;Therapeutic activities;Functional mobility training;Patient/family education;Modalities    PT Goals (Current goals can be found in the Care Plan section)  Acute Rehab PT Goals Patient Stated Goal: return home PT Goal Formulation: With patient Time For Goal Achievement: 07/03/23 Potential to Achieve Goals: Good    Frequency 7X/week     Co-evaluation               AM-PAC PT "6 Clicks" Mobility  Outcome Measure Help needed turning from your back to your side while in a flat bed without using bedrails?: A Little Help needed moving from lying on your back to sitting on the side of a flat bed without using bedrails?: A Little Help needed moving to and from a bed to a chair (including a wheelchair)?: A Little Help needed standing up from a chair using your arms (e.g., wheelchair or bedside chair)?: A Little Help needed to walk in hospital room?: A Little Help needed climbing 3-5 steps with a railing? : A Little  6 Click Score: 18    End of Session Equipment Utilized During Treatment: Gait belt Activity Tolerance: Patient tolerated treatment well Patient left: with chair alarm set;in chair;with call bell/phone within reach   PT Visit Diagnosis: Other abnormalities of gait and mobility (R26.89);Muscle weakness (generalized) (M62.81)    Time: 1137-1220 PT Time Calculation (min) (ACUTE ONLY): 43 min   Charges:   PT Evaluation $PT Eval Low Complexity: 1 Low PT Treatments $Gait Training: 8-22 mins $Therapeutic Activity: 8-22 mins PT General Charges $$ ACUTE PT VISIT: 1 Visit         Anise Salvo, PT Acute Rehab [facility] [phone]   Rayetta Humphrey 06/19/2023, 1:02  PM

## 2023-06-19 NOTE — Plan of Care (Signed)
Problem: Education: Goal: Ability to describe self-care measures that may prevent or decrease complications (Diabetes Survival Skills Education) will improve Outcome: Progressing   Problem: Activity: Goal: Ability to avoid complications of mobility impairment will improve Outcome: Progressing   Problem: Clinical Measurements: Goal: Postoperative complications will be avoided or minimized Outcome: Progressing   Problem: Pain Management: Goal: Pain level will decrease with appropriate interventions Outcome: Progressing   Problem: Skin Integrity: Goal: Will show signs of wound healing Outcome: Progressing

## 2023-06-19 NOTE — Progress Notes (Signed)
Subjective: 1 Day Post-Op Procedure(s) (LRB): RIGHT TOTAL KNEE ARTHROPLASTY (Right) Patient reports pain as mild.   Patient seen in rounds by Dr. Lequita Halt. Patient is well, and has had no acute complaints or problems No issues overnight. Denies chest pain, SOB, or calf pain. Foley catheter removed this AM.  We will begin therapy today.  Objective: Vital signs in last 24 hours: Temp:  [97.7 F (36.5 C)-98.8 F (37.1 C)] 98.2 F (36.8 C) (10/15 0548) Pulse Rate:  [52-85] 82 (10/15 0548) Resp:  [11-20] 18 (10/15 0548) BP: (86-147)/(54-100) 132/86 (10/15 0548) SpO2:  [94 %-100 %] 95 % (10/15 0548) Weight:  [100.7 kg] 100.7 kg (10/14 2242)  Intake/Output from previous day:  Intake/Output Summary (Last 24 hours) at 06/19/2023 0829 Last data filed at 06/19/2023 0700 Gross per 24 hour  Intake 1425 ml  Output 1960 ml  Net -535 ml     Intake/Output this shift: No intake/output data recorded.  Labs: Recent Labs    06/19/23 0345  HGB 15.0   Recent Labs    06/19/23 0345  WBC 15.9*  RBC 4.88  HCT 46.5  PLT 152   Recent Labs    06/19/23 0345  NA 132*  K 4.1  CL 99  CO2 25  BUN 14  CREATININE 1.19  GLUCOSE 159*  CALCIUM 8.9   No results for input(s): "LABPT", "INR" in the last 72 hours.  Exam: General - Patient is Alert and Oriented Extremity - Neurologically intact Neurovascular intact Sensation intact distally Dorsiflexion/Plantar flexion intact Dressing - dressing C/D/I Motor Function - intact, moving foot and toes well on exam.   Past Medical History:  Diagnosis Date   Allergy    Anal cancer (HCC) 04/2012   Aortic stenosis    valve replacement   Arthritis    1995- cerv. 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   CHF (congestive heart failure) (HCC)    Coronary artery disease    aortic  stenosis, CAD   Diabetes mellitus    Type 2   DVT (deep venous thrombosis) (HCC)    ED (erectile dysfunction)    Elevated cholesterol    Heart murmur    no problems   History of kidney stones    passed, lithrotrispy and surgery   History of radiation therapy 05/20/12-06/28/12   anal cancer=54gy total dose   Hypertension    Kidney stones    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   Malignant neoplasm of other sites of rectum, rectosigmoid junction, and anus 04/12/2012   Myocardial infarction (HCC) 2012   Personal history of colonic polyps 03/20/2012   Pulmonary embolism (HCC)    Rectal bleeding    Rectal mass 03/20/2012   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   #11mm [facility] Ease pericardial tissue valve    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   Weight as of this encounter: 100.7 kg. Advance diet Up with therapy D/C IV fluids   Patient's anticipated LOS is less than 2 midnights, meeting these requirements: - Lives within 1 hour of care - Has a competent adult at home to recover with post-op - NO history of             - Chronic pain requiring opiods             - Diabetes             - Heart attack             - Stroke             - Cardiac arrhythmia             - Respiratory Failure/COPD             - Renal failure             - Anemia             - Advanced Liver disease  DVT Prophylaxis - Aspirin and Eliquis Weight bearing as  tolerated. Continue therapy.  Plan is to go Home after hospital stay. Plan for discharge later today if progresses with therapy and meeting goals. Scheduled for OPPT at [facility]). Follow-up in the office in 2 weeks.  The PDMP database was reviewed today prior to any opioid medications being prescribed to this patient.  Arther Abbott, PA-C Orthopedic Surgery [phone] 06/19/2023, 8:29 AM

## 2023-06-19 NOTE — Care Management Obs Status (Signed)
MEDICARE OBSERVATION STATUS NOTIFICATION   Patient Details  Name: Kirk [name] MRN: [id number] Date of Birth: 1946/05/02   Medicare Observation Status Notification Given:  Yes    Ewing Schlein, LCSW 06/19/2023, 2:12 PM

## 2023-06-19 NOTE — Progress Notes (Signed)
Physical Therapy Treatment Patient Details Name: Kirk [name] MRN: [id number] DOB: [birth year] Today's Date: 06/19/2023   History of Present Illness Pt is 77 yo male s/p R TKA on 06/18/23.  Pt with hx including but not limited to arthritis, R LE DVT, CKD, DM2, PE, anal CA, afib, CABG, MI, seizure, cervical fusion    PT Comments  Pt with increased pain this afternoon and increased difficulty with stairs.  He reports he would feel more confident staying another night.  PT agrees - goals not met and pain increased, would benefit from further therapy prior to d/c. Will f/u tomorrow.  Wife is working on getting handrail added to stairs.     If plan is discharge home, recommend the following: A little help with walking and/or transfers;A little help with bathing/dressing/bathroom;Assistance with cooking/housework;Help with stairs or ramp for entrance   Can travel by private vehicle        Equipment Recommendations  Rolling walker (2 wheels)    Recommendations for Other Services       Precautions / Restrictions Precautions Precautions: Fall;Knee Restrictions Weight Bearing Restrictions: Yes RLE Weight Bearing: Weight bearing as tolerated     Mobility  Bed Mobility Overal bed mobility: Needs Assistance Bed Mobility: Supine to Sit     Supine to sit: Supervision     General bed mobility comments: in chair    Transfers Overall transfer level: Needs assistance Equipment used: Rolling walker (2 wheels) Transfers: Sit to/from Stand Sit to Stand: Contact guard assist           General transfer comment: cues for hand placement    Ambulation/Gait Ambulation/Gait assistance: Contact guard assist Gait Distance (Feet): 40 Feet Assistive device: Rolling walker (2 wheels) Gait Pattern/deviations: Step-to pattern, Decreased weight shift to right, Antalgic Gait velocity: decreased but functional     General Gait Details: Increased pain, antalgic, cues for RW  proximity   Stairs Stairs: Yes Stairs assistance: Contact guard assist, Min assist Stair Management: Step to pattern Number of Stairs: 2 General stair comments: Attempting to simulate home environment by placing stairs in doorway so pt could hold onto door jam and use cane in L hand.  He was able to go up with increased time and min A in the manner, but to come down needed rail and cane.  He reports just more painful and not confident this afternoon. WIfe present and working on getting a rail added at [facility]    Modified Rankin (Stroke Patients Only)       Balance Overall balance assessment: Needs assistance Sitting-balance support: No upper extremity supported Sitting balance-Leahy Scale: Good     Standing balance support: Bilateral upper extremity supported, Reliant on assistive device for balance Standing balance-Leahy Scale: Poor Standing balance comment: Steady with RW                            Cognition Arousal: Alert Behavior During Therapy: WFL for tasks assessed/performed Overall Cognitive Status: Within Functional Limits for tasks assessed                                 General Comments: Very kind, motivated, does need redirecting at times        Exercises Total Joint Exercises Ankle Circles/Pumps: AROM, Both, 10 reps, Supine Quad Sets: AROM, Both, 10 reps, Supine Knee  Flexion: AAROM, Right, Supine, 10 reps Goniometric ROM: R knee 10 to 70    General Comments General comments (skin integrity, edema, etc.): Pt with increased pain - reports would prefer to stay another night.      Pertinent Vitals/Pain Pain Assessment Pain Assessment: 0-10 Pain Score: 10-Worst pain ever Pain Location: R knee Pain Descriptors / Indicators: Tender Pain Intervention(s): Limited activity within patient's tolerance, Monitored during session, Premedicated before session, Repositioned, Ice applied    Home Living                           Prior Function            PT Goals (current goals can now be found in the care plan section) Acute Rehab PT Goals Patient Stated Goal: return home PT Goal Formulation: With patient Time For Goal Achievement: 07/03/23 Potential to Achieve Goals: Good Progress towards PT goals: Progressing toward goals    Frequency    7X/week      PT Plan      Co-evaluation              AM-PAC PT "6 Clicks" Mobility   Outcome Measure  Help needed turning from your back to your side while in a flat bed without using bedrails?: A Little Help needed moving from lying on your back to sitting on the side of a flat bed without using bedrails?: A Little Help needed moving to and from a bed to a chair (including a wheelchair)?: A Little Help needed standing up from a chair using your arms (e.g., wheelchair or bedside chair)?: A Little Help needed to walk in hospital room?: A Little Help needed climbing 3-5 steps with a railing? : A Lot 6 Click Score: 17    End of Session Equipment Utilized During Treatment: Gait belt Activity Tolerance: Patient limited by pain Patient left: in chair;with call bell/phone within reach (pt requesting alarm off as every time he shifts weight it goes off; reports he will call to get up (even for transfer to bed)) Nurse Communication: Mobility status PT Visit Diagnosis: Other abnormalities of gait and mobility (R26.89);Muscle weakness (generalized) (M62.81)     Time: 1545-1610 PT Time Calculation (min) (ACUTE ONLY): 25 min  Charges:    $Gait Training: 8-22 mins $Therapeutic Exercise: 8-22 mins $Therapeutic Activity: 8-22 mins PT General Charges $$ ACUTE PT VISIT: 1 Visit                     Anise Salvo, PT Acute Rehab [facility] [phone]    Rayetta Humphrey 06/19/2023, 4:34 PM

## 2023-06-20 DIAGNOSIS — Z7901 Long term (current) use of anticoagulants: Secondary | ICD-10-CM | POA: Diagnosis not present

## 2023-06-20 DIAGNOSIS — E039 Hypothyroidism, unspecified: Secondary | ICD-10-CM | POA: Diagnosis not present

## 2023-06-20 DIAGNOSIS — I110 Hypertensive heart disease with heart failure: Secondary | ICD-10-CM | POA: Diagnosis not present

## 2023-06-20 DIAGNOSIS — I2510 Atherosclerotic heart disease of native coronary artery without angina pectoris: Secondary | ICD-10-CM | POA: Diagnosis not present

## 2023-06-20 DIAGNOSIS — I509 Heart failure, unspecified: Secondary | ICD-10-CM | POA: Diagnosis not present

## 2023-06-20 DIAGNOSIS — E119 Type 2 diabetes mellitus without complications: Secondary | ICD-10-CM | POA: Diagnosis not present

## 2023-06-20 DIAGNOSIS — Z86711 Personal history of pulmonary embolism: Secondary | ICD-10-CM | POA: Diagnosis not present

## 2023-06-20 DIAGNOSIS — M1711 Unilateral primary osteoarthritis, right knee: Secondary | ICD-10-CM | POA: Diagnosis not present

## 2023-06-20 DIAGNOSIS — Z85048 Personal history of other malignant neoplasm of rectum, rectosigmoid junction, and anus: Secondary | ICD-10-CM | POA: Diagnosis not present

## 2023-06-20 DIAGNOSIS — I4891 Unspecified atrial fibrillation: Secondary | ICD-10-CM | POA: Diagnosis not present

## 2023-06-20 DIAGNOSIS — Z79899 Other long term (current) drug therapy: Secondary | ICD-10-CM | POA: Diagnosis not present

## 2023-06-20 DIAGNOSIS — Z86718 Personal history of other venous thrombosis and embolism: Secondary | ICD-10-CM | POA: Diagnosis not present

## 2023-06-20 DIAGNOSIS — Z951 Presence of aortocoronary bypass graft: Secondary | ICD-10-CM | POA: Diagnosis not present

## 2023-06-20 LAB — CBC
HCT: 43.4 % (ref 39.0–52.0)
Hemoglobin: 13.8 g/dL (ref 13.0–17.0)
MCH: 31.1 pg (ref 26.0–34.0)
MCHC: 31.8 g/dL (ref 30.0–36.0)
MCV: 97.7 fL (ref 80.0–100.0)
Platelets: 130 10*3/uL — ABNORMAL LOW (ref 150–400)
RBC: 4.44 MIL/uL (ref 4.22–5.81)
RDW: 13.9 % (ref 11.5–15.5)
WBC: 10.8 10*3/uL — ABNORMAL HIGH (ref 4.0–10.5)
nRBC: 0 % (ref 0.0–0.2)

## 2023-06-20 LAB — GLUCOSE, CAPILLARY
Glucose-Capillary: 129 mg/dL — ABNORMAL HIGH (ref 70–99)
Glucose-Capillary: 133 mg/dL — ABNORMAL HIGH (ref 70–99)

## 2023-06-20 MED ORDER — CHLORHEXIDINE GLUCONATE CLOTH 2 % EX PADS: 6.0000 | MEDICATED_PAD | Freq: Every day | CUTANEOUS | Status: DC

## 2023-06-20 MED ORDER — MUPIROCIN 2 % EX OINT
1.0000 | TOPICAL_OINTMENT | Freq: Two times a day (BID) | CUTANEOUS | Status: DC
  Filled 2023-06-20: qty 22

## 2023-06-20 NOTE — Progress Notes (Signed)
Physical Therapy Treatment Patient Details Name: Kirk [name] MRN: [id number] DOB: 1946-03-17 Today's Date: 06/20/2023   History of Present Illness Pt is 77 yo male s/p R TKA on 06/18/23.  Pt with hx including but not limited to arthritis, R LE DVT, CKD, DM2, PE, anal CA, afib, CABG, MI, seizure, cervical fusion    PT Comments  POD # 1 pm session Spouse present during session.  Assisted with amb in hallway, practiced stairs.  Then returned to room to perform some TE's following HEP handout.  Instructed on proper tech, freq as well as use of ICE.   Addressed all mobility questions, discussed appropriate activity, educated on use of ICE.  Pt ready for D/C to home.    If plan is discharge home, recommend the following: A little help with walking and/or transfers;A little help with bathing/dressing/bathroom;Assistance with cooking/housework;Help with stairs or ramp for entrance   Can travel by private vehicle        Equipment Recommendations  Rolling walker (2 wheels)    Recommendations for Other Services       Precautions / Restrictions Precautions Precautions: Fall;Knee Precaution Comments: no pillow under knee Restrictions Weight Bearing Restrictions: No RLE Weight Bearing: Weight bearing as tolerated     Mobility  Bed Mobility Overal bed mobility: Needs Assistance Bed Mobility: Supine to Sit     Supine to sit: Supervision     General bed mobility comments: OOB in recliner    Transfers Overall transfer level: Needs assistance Equipment used: Rolling walker (2 wheels) Transfers: Sit to/from Stand Sit to Stand: Supervision           General transfer comment: verbal cues for hand placement and safety turns.    Ambulation/Gait Ambulation/Gait assistance: Contact guard assist, Supervision Gait Distance (Feet): 55 Feet Assistive device: Rolling walker (2 wheels) Gait Pattern/deviations: Step-to pattern, Decreased weight shift to right, Antalgic Gait  velocity: decreased     General Gait Details: tolerated a functional distance in hallway.  Spouse present and observed   Stairs Stairs: Yes Stairs assistance: Supervision, Contact guard assist Stair Management: Step to pattern Number of Stairs: 2 General stair comments: pt was able to safely navigate 3 small rise steps using B rails.  Spouse reported the garage steps were just recent "made over" to 3 amaller steps vs 2 large   Wheelchair Mobility     Tilt Bed    Modified Rankin (Stroke Patients Only)       Balance                                            Cognition Arousal: Alert Behavior During Therapy: WFL for tasks assessed/performed Overall Cognitive Status: Within Functional Limits for tasks assessed                                 General Comments: AxO x 3 very pleasant and motivated        Exercises   Total Knee Replacement TE's following HEP handout 10 reps B LE ankle pumps 05 reps towel squeezes 05 reps knee presses 05 reps heel slides  05 reps SAQ's 05 reps SLR's 05 reps ABD Educated on use of gait belt to assist with TE's Followed by ICE    General Comments        Pertinent Vitals/Pain  Pain Assessment Pain Assessment: Faces Pain Score: 7  Pain Location: R knee Pain Descriptors / Indicators: Aching, Operative site guarding, Grimacing, Guarding, Sore Pain Intervention(s): Monitored during session, Premedicated before session, Repositioned, Ice applied    Home Living                          Prior Function            PT Goals (current goals can now be found in the care plan section) Progress towards PT goals: Progressing toward goals    Frequency    7X/week      PT Plan      Co-evaluation              AM-PAC PT "6 Clicks" Mobility   Outcome Measure  Help needed turning from your back to your side while in a flat bed without using bedrails?: A Little Help needed moving from  lying on your back to sitting on the side of a flat bed without using bedrails?: A Little Help needed moving to and from a bed to a chair (including a wheelchair)?: A Little Help needed standing up from a chair using your arms (e.g., wheelchair or bedside chair)?: A Little Help needed to walk in hospital room?: A Little Help needed climbing 3-5 steps with a railing? : A Lot 6 Click Score: 17    End of Session Equipment Utilized During Treatment: Gait belt Activity Tolerance: Patient limited by pain Patient left: in chair;with call bell/phone within reach Nurse Communication: Mobility status PT Visit Diagnosis: Other abnormalities of gait and mobility (R26.89);Muscle weakness (generalized) (M62.81)     Time: 1324-4010 PT Time Calculation (min) (ACUTE ONLY): 46 min  Charges:    $Gait Training: 8-22 mins $Therapeutic Exercise: 8-22 mins $Therapeutic Activity: 8-22 mins PT General Charges $$ ACUTE PT VISIT: 1 Visit                    Felecia Shelling  PTA Acute  Rehabilitation Services Office M-F          [phone]

## 2023-06-20 NOTE — Progress Notes (Signed)
Nursing Discharge Note   Name: Kirk [name] MRN: [id number] DOB: 17-Aug-1946    Admit Date: 06/18/2023  Discharge Date: 06/20/2023   Kirk [name] is to be discharged home per MD order.  AVS completed. Reviewed with patient and family at bedside. Highlighted copy provided for patient to take home.  Patient/caregiver able to verbalize understanding of discharge instructions. PIV removed. Patient stable upon discharge.   Discharge Instructions     Call MD / Call 911   Complete by: As directed    If you experience chest pain or shortness of breath, CALL 911 and be transported to the hospital emergency room.  If you develope a fever above 101 F, pus (white drainage) or increased drainage or redness at the wound, or calf pain, call your surgeon's office.   Change dressing   Complete by: As directed    You may remove the bulky bandage (ACE wrap and gauze) two days after surgery. You will have an adhesive waterproof bandage underneath. Leave this in place until your first follow-up appointment.   Constipation Prevention   Complete by: As directed    Drink plenty of fluids.  Prune juice may be helpful.  You may use a stool softener, such as Colace (over the counter) 100 mg twice a day.  Use MiraLax (over the counter) for constipation as needed.   Diet - low sodium heart healthy   Complete by: As directed    Do not put a pillow under the knee. Place it under the heel.   Complete by: As directed    Driving restrictions   Complete by: As directed    No driving for two weeks   Post-operative opioid taper instructions:   Complete by: As directed    POST-OPERATIVE OPIOID TAPER INSTRUCTIONS: It is important to wean off of your opioid medication as soon as possible. If you do not need pain medication after your surgery it is ok to stop day one. Opioids include: Codeine, Hydrocodone(Norco, Vicodin), Oxycodone(Percocet, oxycontin) and hydromorphone amongst others.  Long term and even short  term use of opiods can cause: Increased pain response Dependence Constipation Depression Respiratory depression And more.  Withdrawal symptoms can include Flu like symptoms Nausea, vomiting And more Techniques to manage these symptoms Hydrate well Eat regular healthy meals Stay active Use relaxation techniques(deep breathing, meditating, yoga) Do Not substitute Alcohol to help with tapering If you have been on opioids for less than two weeks and do not have pain than it is ok to stop all together.  Plan to wean off of opioids This plan should start within one week post op of your joint replacement. Maintain the same interval or time between taking each dose and first decrease the dose.  Cut the total daily intake of opioids by one tablet each day Next start to increase the time between doses. The last dose that should be eliminated is the evening dose.      TED hose   Complete by: As directed    Use stockings (TED hose) for three weeks on both leg(s).  You may remove them at night for sleeping.   Weight bearing as tolerated   Complete by: As directed          Allergies as of 06/20/2023       Reactions   Cat Hair Extract Shortness Of Breath, Swelling   Hydrocodone Other (See Comments)   Atorvastatin Other (See Comments)   Pt has muscle ache on Atorvastatin but IS  ABLE TO TAKE LIPITOR without any issues   Dust Mite Extract Other (See Comments), Swelling   Metformin Other (See Comments)   Diarrhea, muscle cramps   Oxycodone Other (See Comments)   Seizure PATIENT DOES NOT WANT ANY NARCOTICS   Semaglutide(0.25 Or 0.5mg -dos) Nausea Only   AT HIGHER DOSE         Discharge Care Instructions  (From admission, onward)           Start     Ordered   06/19/23 0000  Weight bearing as tolerated        06/19/23 0832   06/19/23 0000  Change dressing       Comments: You may remove the bulky bandage (ACE wrap and gauze) two days after surgery. You will have an adhesive  waterproof bandage underneath. Leave this in place until your first follow-up appointment.   06/19/23 0832             Discharge Instructions/ Education: Discharge instructions given to patient/family with verbalized understanding. Discharge education completed with patient/family including: follow up instructions, medication list, discharge activities, and limitations if indicated. Patient escorted via wheelchair to lobby and discharged home via private automobile.

## 2023-06-20 NOTE — Progress Notes (Signed)
Physical Therapy Treatment Patient Details Name: Kirk [name] MRN: [id number] DOB: 1946/02/02 Today's Date: 06/20/2023   History of Present Illness Pt is 77 yo male s/p R TKA on 06/18/23.  Pt with hx including but not limited to arthritis, R LE DVT, CKD, DM2, PE, anal CA, afib, CABG, MI, seizure, cervical fusion    PT Comments  POD # 1 am session General Comments: AxO x 3 very pleasant and motivated.  Assisted OOB to amb to bathroom to void.  General transfer comment: 50% cues for hand placement and safety turns. General Gait Details: decreased amb distance this session due to increased pain with exccessive leqan on walker and inability to fully WB thru R LE plus knee flexed.  Pt reports pain 7/10  Amb distance limited to 22 feet.  Assisted to recliner and performed a few TE's but unable to complete due to increased pain.  Applied ICE. Pt will need another PT session with Spouse present to complete HEP Education as well as practice stairs.    If plan is discharge home, recommend the following: A little help with walking and/or transfers;A little help with bathing/dressing/bathroom;Assistance with cooking/housework;Help with stairs or ramp for entrance   Can travel by private vehicle        Equipment Recommendations  Rolling walker (2 wheels)    Recommendations for Other Services       Precautions / Restrictions Precautions Precautions: Fall;Knee Precaution Comments: no pillow under knee Restrictions Weight Bearing Restrictions: No RLE Weight Bearing: Weight bearing as tolerated     Mobility  Bed Mobility Overal bed mobility: Needs Assistance Bed Mobility: Supine to Sit     Supine to sit: Supervision     General bed mobility comments: demonstarted and instructed how to use belt to self asisst LE    Transfers Overall transfer level: Needs assistance Equipment used: Rolling walker (2 wheels) Transfers: Sit to/from Stand Sit to Stand: Contact guard assist,  Supervision           General transfer comment: 50% cues for hand placement and safety turns.    Ambulation/Gait Ambulation/Gait assistance: Contact guard assist, Supervision Gait Distance (Feet): 22 Feet Assistive device: Rolling walker (2 wheels) Gait Pattern/deviations: Step-to pattern, Decreased weight shift to right, Antalgic Gait velocity: decreased     General Gait Details: decreased amb distance this session due to increased pain with exccessive leqan on walker and inability to fully WB thru R LE plus knee flexed.  Pt reports pain 7/10   Stairs             Wheelchair Mobility     Tilt Bed    Modified Rankin (Stroke Patients Only)       Balance                                            Cognition Arousal: Alert Behavior During Therapy: WFL for tasks assessed/performed Overall Cognitive Status: Within Functional Limits for tasks assessed                                 General Comments: AxO x 3 very pleasant and motivated        Exercises  10 reps AP  05 reps knee presses Limited by increased pain Applied ICE    General Comments  Pertinent Vitals/Pain Pain Assessment Pain Assessment: Faces Pain Score: 7  Pain Location: R knee Pain Descriptors / Indicators: Aching, Operative site guarding, Grimacing, Guarding, Sore Pain Intervention(s): Monitored during session, Premedicated before session, Repositioned, Ice applied    Home Living                          Prior Function            PT Goals (current goals can now be found in the care plan section) Progress towards PT goals: Progressing toward goals    Frequency    7X/week      PT Plan      Co-evaluation              AM-PAC PT "6 Clicks" Mobility   Outcome Measure  Help needed turning from your back to your side while in a flat bed without using bedrails?: A Little Help needed moving from lying on your back to  sitting on the side of a flat bed without using bedrails?: A Little Help needed moving to and from a bed to a chair (including a wheelchair)?: A Little Help needed standing up from a chair using your arms (e.g., wheelchair or bedside chair)?: A Little Help needed to walk in hospital room?: A Little Help needed climbing 3-5 steps with a railing? : A Lot 6 Click Score: 17    End of Session Equipment Utilized During Treatment: Gait belt Activity Tolerance: Patient limited by pain Patient left: in chair;with call bell/phone within reach Nurse Communication: Mobility status PT Visit Diagnosis: Other abnormalities of gait and mobility (R26.89);Muscle weakness (generalized) (M62.81)     Time: 0865-7846 PT Time Calculation (min) (ACUTE ONLY): 24 min  Charges:    $Gait Training: 8-22 mins $Therapeutic Activity: 8-22 mins PT General Charges $$ ACUTE PT VISIT: 1 Visit                     Felecia Shelling  PTA Acute  Rehabilitation Services Office M-F          [phone]

## 2023-06-20 NOTE — Progress Notes (Signed)
Subjective: 2 Days Post-Op Procedure(s) (LRB): RIGHT TOTAL KNEE ARTHROPLASTY (Right) Patient seen in rounds for Dr. Lequita Halt. Patient is well, and has had no acute complaints or problems. Denies SOB or chest pain. Denies calf pain. Patient reports pain as  moderate to severe. Slowly improving .    Objective: Vital signs in last 24 hours: Temp:  [97.6 F (36.4 C)-98.4 F (36.9 C)] 97.8 F (36.6 C) (10/16 0557) Pulse Rate:  [72-99] 84 (10/16 0557) Resp:  [17-19] 18 (10/16 0557) BP: (123-143)/(66-88) 123/66 (10/16 0557) SpO2:  [87 %-96 %] 92 % (10/16 0557)  Intake/Output from previous day:  Intake/Output Summary (Last 24 hours) at 06/20/2023 0904 Last data filed at 06/20/2023 0600 Gross per 24 hour  Intake 901.33 ml  Output 1525 ml  Net -623.67 ml    Intake/Output this shift: No intake/output data recorded.  Labs: Recent Labs    06/19/23 0345 06/20/23 0328  HGB 15.0 13.8   Recent Labs    06/19/23 0345 06/20/23 0328  WBC 15.9* 10.8*  RBC 4.88 4.44  HCT 46.5 43.4  PLT 152 130*   Recent Labs    06/19/23 0345  NA 132*  K 4.1  CL 99  CO2 25  BUN 14  CREATININE 1.19  GLUCOSE 159*  CALCIUM 8.9   No results for input(s): "LABPT", "INR" in the last 72 hours.  Exam: General - Patient is Alert and Oriented Extremity - Neurologically intact Neurovascular intact Sensation intact distally Dorsiflexion/Plantar flexion intact Dressing/Incision - clean, dry, no drainage Motor Function - intact, moving foot and toes well on exam.  Past Medical History:  Diagnosis Date   Allergy    Anal cancer (HCC) 04/2012   Aortic stenosis    valve replacement   Arthritis    1995- cerv. 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   CHF (congestive heart failure) (HCC)    Coronary artery disease    aortic stenosis, CAD    Diabetes mellitus    Type 2   DVT (deep venous thrombosis) (HCC)    ED (erectile dysfunction)    Elevated cholesterol    Heart murmur    no problems   History of kidney stones    passed, lithrotrispy and surgery   History of radiation therapy 05/20/12-06/28/12   anal cancer=54gy total dose   Hypertension    Kidney stones    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   Malignant neoplasm of other sites of rectum, rectosigmoid junction, and anus 04/12/2012   Myocardial infarction (HCC) 2012   Personal history of colonic polyps 03/20/2012   Pulmonary embolism (HCC)    Rectal bleeding    Rectal mass 03/20/2012   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   #87mm [facility] Ease pericardial tissue valve    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   Weight as of this encounter: 100.7 kg.  DVT Prophylaxis - Aspirin and Eliquis Weight-bearing as tolerated.  Continue physical therapy. Expected discharge home today pending progress and symptom management. Scheduled for OPPT at [facility]. Follow-up in clinic in 2 weeks.  Alfonzo Feller, PA-C Orthopedic Surgery 06/20/2023, 9:04 AM

## 2023-06-20 NOTE — Plan of Care (Signed)
  Problem: Education: Goal: Ability to describe self-care measures that may prevent or decrease complications (Diabetes Survival Skills Education) will improve Outcome: Progressing Goal: Individualized Educational Video(s) Outcome: Progressing   Problem: Coping: Goal: Ability to adjust to condition or change in health will improve Outcome: Progressing   

## 2023-06-22 DIAGNOSIS — M25611 Stiffness of right shoulder, not elsewhere classified: Secondary | ICD-10-CM | POA: Diagnosis not present

## 2023-06-22 DIAGNOSIS — M6281 Muscle weakness (generalized): Secondary | ICD-10-CM | POA: Diagnosis not present

## 2023-06-25 DIAGNOSIS — M25611 Stiffness of right shoulder, not elsewhere classified: Secondary | ICD-10-CM | POA: Diagnosis not present

## 2023-06-25 DIAGNOSIS — M6281 Muscle weakness (generalized): Secondary | ICD-10-CM | POA: Diagnosis not present

## 2023-06-25 NOTE — Discharge Summary (Signed)
Patient ID: CLOIS MCNALL MRN: [id number] DOB/AGE: December 31, 1945 77 y.o.  Admit date: 06/18/2023 Discharge date: 06/20/2023  Admission Diagnoses:  Principal Problem:   Osteoarthritis of knee Active Problems:   Primary osteoarthritis of right knee   Discharge Diagnoses:  Same  Past Medical History:  Diagnosis Date   Allergy    Anal cancer (HCC) 04/2012   Aortic stenosis    valve replacement   Arthritis    1995- cerv. 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   CHF (congestive heart failure) (HCC)    Coronary artery disease    aortic stenosis, CAD   Diabetes mellitus    Type 2   DVT (deep venous thrombosis) (HCC)    ED (erectile dysfunction)    Elevated cholesterol    Heart murmur    no problems   History of kidney stones    passed, lithrotrispy and surgery   History of radiation therapy 05/20/12-06/28/12   anal cancer=54gy total dose   Hypertension    Kidney stones    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   Malignant neoplasm of other sites of rectum, rectosigmoid junction, and anus 04/12/2012   Myocardial infarction (HCC) 2012   Personal history of colonic polyps 03/20/2012   Pulmonary embolism (HCC)    Rectal bleeding    Rectal mass 03/20/2012   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 male who was admitted 06/18/2023 for operative treatment  ofOsteoarthritis of knee. Patient has severe unremitting pain that affects sleep, daily activities, and work/hobbies. After pre-op clearance the patient was taken to the operating room on 06/18/2023 and underwent  Procedure(s): RIGHT TOTAL KNEE ARTHROPLASTY.    Patient was given perioperative antibiotics:  Anti-infectives (From admission, onward)    Start     Dose/Rate Route Frequency Ordered Stop   06/18/23 2000  ceFAZolin (ANCEF) IVPB 2g/100 mL premix        2 g 200 mL/hr over 30 Minutes Intravenous Every 6 hours 06/18/23 1848 06/19/23 0744   06/18/23 1030  ceFAZolin (ANCEF) IVPB 2g/100 mL premix        2 g 200 mL/hr over 30 Minutes Intravenous On call to O.R. 06/18/23 1024 06/18/23 1246        Patient was given sequential compression devices, early ambulation, and chemoprophylaxis to prevent DVT.  Patient benefited maximally from hospital stay and there were no complications.    Recent vital signs: No data found.   Recent laboratory studies: No results for input(s): "WBC", "HGB", "HCT", "PLT", "NA", "K", "CL", "CO2", "BUN", "CREATININE", "GLUCOSE", "INR", "CALCIUM" in the last 72 hours.  Invalid input(s): "PT", "2"   Discharge Medications:   Allergies as of 06/20/2023       Reactions   Cat Hair Extract Shortness Of Breath, Swelling   Hydrocodone Other (See Comments)  Atorvastatin Other (See Comments)   Pt has muscle ache on Atorvastatin but IS ABLE TO TAKE LIPITOR without any issues   Dust Mite Extract Other (See Comments), Swelling   Metformin Other (See Comments)   Diarrhea, muscle cramps   Oxycodone Other (See Comments)   Seizure PATIENT DOES NOT WANT ANY NARCOTICS   Semaglutide(0.25 Or 0.5mg -dos) Nausea Only   AT HIGHER DOSE        Medication List     TAKE these medications    AMBULATORY NON FORMULARY MEDICATION 1 mL by Intracavernosal route once as needed for up to 1 dose. Medication Name: Trimix PAPAVERINE/PHENTOLAMINE/PROSTAGLANDIN E1 (5 mL vial)  30mg -2mg - What changed: reasons to take this Notes to patient: Resume home regimen   amitriptyline 25 MG tablet Commonly known as: ELAVIL Take 6.25 mg by mouth at bedtime as needed for sleep. Notes to patient: Resume home regimen   aspirin EC 81 MG tablet Take 81 mg by mouth daily.   Cholecalciferol 25 MCG (1000 UT) tablet Take 1,000 Units by mouth daily. Notes to patient: Resume home regimen   DULoxetine 60 MG capsule Commonly known as: CYMBALTA Take 1 capsule (60 mg total) by mouth at bedtime.   Eliquis 2.5 MG Tabs tablet Generic drug: apixaban Take 1 tablet by mouth twice daily   Farxiga 5 MG Tabs tablet Generic drug: dapagliflozin propanediol Take 1 tablet by mouth once daily   HYDROmorphone 2 MG tablet Commonly known as: DILAUDID Take 0.5-1 tablets (1-2 mg total) by mouth every 6 (six) hours as needed for moderate pain (pain score 4-6) or severe pain (pain score 7-10). Notes to patient: Last dose given 10/15 09:59am   ketoconazole 2 % shampoo Commonly known as: NIZORAL Apply 1 Application topically 2 (two) times a week.   Lipo-Flavonoid Plus Tabs Take 1 tablet by mouth 3 (three) times daily after meals. Notes to patient: Resume home regimen   losartan 50 MG tablet Commonly known as: COZAAR Take 1 tablet (50 mg total) by mouth daily.   methocarbamol 500 MG tablet Commonly known as: ROBAXIN Take 1 tablet (500 mg total) by mouth every 6 (six) hours as needed for muscle spasms. Notes to patient: Last dose given 10/15 09:58am   metoprolol tartrate 25 MG tablet Commonly known as: LOPRESSOR TAKE 1 TABLET BY MOUTH TWICE A DAY   Myrbetriq 25 MG Tb24 tablet Generic drug: mirabegron ER Take 1 tablet (25 mg total) by mouth daily.   NEEDLE (DISP) 22 G 22G X 1-1/2" Misc 1 each by Does not apply route once a week. Use to administer medication.   ondansetron 4 MG tablet Commonly known as: ZOFRAN Take 1 tablet (4 mg total) by mouth every 6 (six) hours as needed  for nausea.   Ozempic (0.25 or 0.5 MG/DOSE) 2 MG/3ML Sopn Generic drug: Semaglutide(0.25 or 0.5MG /DOS) Inject 0.25 mg into the skin once a week. What changed: additional instructions Notes to patient: Resume home regimen   rosuvastatin 40 MG tablet Commonly known as: CRESTOR Take 40 mg by mouth at bedtime.   SYRINGE 3CC/18GX1-1/2" 18G X 1-1/2" 3 ML Misc 1 each by Does not apply route once a week. Use to draw up medication. Notes to patient: Resume home regimen   tadalafil 10 MG tablet Commonly known as: CIALIS Take 1 tablet (10 mg total) by mouth daily as needed for erectile dysfunction. Notes to patient: Resume home regimen   testosterone cypionate 100 MG/ML injection Commonly known as: DEPOTESTOTERONE CYPIONATE INJECT 1/2 (ONE-HALF) ML (CC) INTRAMUSCULARLY  ONCE A WEEK What changed: See the new instructions. Notes to patient: Resume home regimen   UltiCare Alcohol Swabs 70 % Pads 1 each by Does not apply route once a week. Notes to patient: Resume home regimen   ZINC-220 PO Take 220 mg by mouth daily. Notes to patient: Resume home regimen               Discharge Care Instructions  (From admission, onward)           Start     Ordered   06/19/23 0000  Weight bearing as tolerated        06/19/23 0832   06/19/23 0000  Change dressing       Comments: You may remove the bulky bandage (ACE wrap and gauze) two days after surgery. You will have an adhesive waterproof bandage underneath. Leave this in place until your first follow-up appointment.   06/19/23 0832            Diagnostic Studies: No results found.  Disposition: Discharge disposition: 01-Home or Self Care       Discharge Instructions     Call MD / Call 911   Complete by: As directed    If you experience chest pain or shortness of breath, CALL 911 and be transported to the hospital emergency room.  If you develope a fever above 101 F, pus (white drainage) or increased drainage or redness  at the wound, or calf pain, call your surgeon's office.   Change dressing   Complete by: As directed    You may remove the bulky bandage (ACE wrap and gauze) two days after surgery. You will have an adhesive waterproof bandage underneath. Leave this in place until your first follow-up appointment.   Constipation Prevention   Complete by: As directed    Drink plenty of fluids.  Prune juice may be helpful.  You may use a stool softener, such as Colace (over the counter) 100 mg twice a day.  Use MiraLax (over the counter) for constipation as needed.   Diet - low sodium heart healthy   Complete by: As directed    Do not put a pillow under the knee. Place it under the heel.   Complete by: As directed    Driving restrictions   Complete by: As directed    No driving for two weeks   Post-operative opioid taper instructions:   Complete by: As directed    POST-OPERATIVE OPIOID TAPER INSTRUCTIONS: It is important to wean off of your opioid medication as soon as possible. If you do not need pain medication after your surgery it is ok to stop day one. Opioids include: Codeine, Hydrocodone(Norco, Vicodin), Oxycodone(Percocet, oxycontin) and hydromorphone amongst others.  Long term and even short term use of opiods can cause: Increased pain response Dependence Constipation Depression Respiratory depression And more.  Withdrawal symptoms can include Flu like symptoms Nausea, vomiting And more Techniques to manage these symptoms Hydrate well Eat regular healthy meals Stay active Use relaxation techniques(deep breathing, meditating, yoga) Do Not substitute Alcohol to help with tapering If you have been on opioids for less than two weeks and do not have pain than it is ok to stop all together.  Plan to wean off of opioids This plan should start within one week post op of your joint replacement. Maintain the same interval or time between taking each dose and first decrease the dose.  Cut the  total daily intake of opioids by one tablet each  day Next start to increase the time between doses. The last dose that should be eliminated is the evening dose.      TED hose   Complete by: As directed    Use stockings (TED hose) for three weeks on both leg(s).  You may remove them at night for sleeping.   Weight bearing as tolerated   Complete by: As directed         Follow-up Information     Aluisio, Homero Fellers, MD Follow up in 2 week(s).   Specialty: Orthopedic Surgery Contact information: [address] [place] [address] [phone]                  Signed: Arther Abbott 06/25/2023, 9:03 AM

## 2023-06-27 DIAGNOSIS — M6281 Muscle weakness (generalized): Secondary | ICD-10-CM | POA: Diagnosis not present

## 2023-06-27 DIAGNOSIS — M25611 Stiffness of right shoulder, not elsewhere classified: Secondary | ICD-10-CM | POA: Diagnosis not present

## 2023-06-29 ENCOUNTER — Other Ambulatory Visit (HOSPITAL_BASED_OUTPATIENT_CLINIC_OR_DEPARTMENT_OTHER): Payer: Self-pay | Admitting: *Deleted

## 2023-06-29 DIAGNOSIS — M25661 Stiffness of right knee, not elsewhere classified: Secondary | ICD-10-CM | POA: Insufficient documentation

## 2023-06-29 DIAGNOSIS — M25561 Pain in right knee: Secondary | ICD-10-CM | POA: Diagnosis not present

## 2023-06-29 DIAGNOSIS — E1142 Type 2 diabetes mellitus with diabetic polyneuropathy: Secondary | ICD-10-CM

## 2023-06-29 DIAGNOSIS — M6281 Muscle weakness (generalized): Secondary | ICD-10-CM | POA: Diagnosis not present

## 2023-06-29 MED ORDER — OZEMPIC (0.25 OR 0.5 MG/DOSE) 2 MG/3ML ~~LOC~~ SOPN: 0.2500 mg | PEN_INJECTOR | SUBCUTANEOUS | 0 refills | Status: DC

## 2023-07-02 ENCOUNTER — Other Ambulatory Visit (HOSPITAL_BASED_OUTPATIENT_CLINIC_OR_DEPARTMENT_OTHER): Payer: Medicare Other

## 2023-07-02 DIAGNOSIS — M25562 Pain in left knee: Secondary | ICD-10-CM | POA: Diagnosis not present

## 2023-07-02 DIAGNOSIS — M25661 Stiffness of right knee, not elsewhere classified: Secondary | ICD-10-CM | POA: Diagnosis not present

## 2023-07-04 DIAGNOSIS — M25561 Pain in right knee: Secondary | ICD-10-CM | POA: Diagnosis not present

## 2023-07-04 DIAGNOSIS — M25661 Stiffness of right knee, not elsewhere classified: Secondary | ICD-10-CM | POA: Diagnosis not present

## 2023-07-06 DIAGNOSIS — M25661 Stiffness of right knee, not elsewhere classified: Secondary | ICD-10-CM | POA: Diagnosis not present

## 2023-07-06 DIAGNOSIS — M25561 Pain in right knee: Secondary | ICD-10-CM | POA: Diagnosis not present

## 2023-07-09 ENCOUNTER — Ambulatory Visit: Payer: Medicare Other | Admitting: Podiatry

## 2023-07-09 DIAGNOSIS — M25562 Pain in left knee: Secondary | ICD-10-CM | POA: Diagnosis not present

## 2023-07-09 DIAGNOSIS — M25661 Stiffness of right knee, not elsewhere classified: Secondary | ICD-10-CM | POA: Diagnosis not present

## 2023-07-11 DIAGNOSIS — M25661 Stiffness of right knee, not elsewhere classified: Secondary | ICD-10-CM | POA: Diagnosis not present

## 2023-07-11 DIAGNOSIS — M25562 Pain in left knee: Secondary | ICD-10-CM | POA: Diagnosis not present

## 2023-07-13 ENCOUNTER — Other Ambulatory Visit (HOSPITAL_BASED_OUTPATIENT_CLINIC_OR_DEPARTMENT_OTHER): Payer: Self-pay | Admitting: *Deleted

## 2023-07-13 ENCOUNTER — Telehealth (HOSPITAL_BASED_OUTPATIENT_CLINIC_OR_DEPARTMENT_OTHER): Payer: Self-pay | Admitting: Family Medicine

## 2023-07-13 MED ORDER — ROSUVASTATIN CALCIUM 40 MG PO TABS: 40.0000 mg | ORAL_TABLET | Freq: Every evening | ORAL | 0 refills | Status: DC

## 2023-07-13 NOTE — Telephone Encounter (Signed)
Medication sent to pharmacy  

## 2023-07-13 NOTE — Telephone Encounter (Signed)
  rosuvastatin (CRESTOR) 40 MG tablet  Please send in a 90 day supply

## 2023-07-16 DIAGNOSIS — M25661 Stiffness of right knee, not elsewhere classified: Secondary | ICD-10-CM | POA: Diagnosis not present

## 2023-07-16 DIAGNOSIS — M25561 Pain in right knee: Secondary | ICD-10-CM | POA: Diagnosis not present

## 2023-07-18 DIAGNOSIS — M25561 Pain in right knee: Secondary | ICD-10-CM | POA: Diagnosis not present

## 2023-07-18 DIAGNOSIS — M25661 Stiffness of right knee, not elsewhere classified: Secondary | ICD-10-CM | POA: Diagnosis not present

## 2023-07-21 DIAGNOSIS — J189 Pneumonia, unspecified organism: Secondary | ICD-10-CM | POA: Diagnosis not present

## 2023-07-21 DIAGNOSIS — R051 Acute cough: Secondary | ICD-10-CM | POA: Diagnosis not present

## 2023-07-21 DIAGNOSIS — R0602 Shortness of breath: Secondary | ICD-10-CM | POA: Diagnosis not present

## 2023-07-24 DIAGNOSIS — Z5189 Encounter for other specified aftercare: Secondary | ICD-10-CM | POA: Diagnosis not present

## 2023-07-25 DIAGNOSIS — M6281 Muscle weakness (generalized): Secondary | ICD-10-CM | POA: Diagnosis not present

## 2023-07-25 DIAGNOSIS — M25661 Stiffness of right knee, not elsewhere classified: Secondary | ICD-10-CM | POA: Diagnosis not present

## 2023-07-31 ENCOUNTER — Other Ambulatory Visit (HOSPITAL_BASED_OUTPATIENT_CLINIC_OR_DEPARTMENT_OTHER): Payer: Self-pay | Admitting: Family Medicine

## 2023-07-31 DIAGNOSIS — I10 Essential (primary) hypertension: Secondary | ICD-10-CM

## 2023-07-31 DIAGNOSIS — E119 Type 2 diabetes mellitus without complications: Secondary | ICD-10-CM

## 2023-08-07 ENCOUNTER — Ambulatory Visit (HOSPITAL_BASED_OUTPATIENT_CLINIC_OR_DEPARTMENT_OTHER): Payer: Medicare Other | Admitting: Family Medicine

## 2023-08-07 ENCOUNTER — Ambulatory Visit (INDEPENDENT_AMBULATORY_CARE_PROVIDER_SITE_OTHER): Payer: Medicare Other | Admitting: Family Medicine

## 2023-08-07 ENCOUNTER — Telehealth: Payer: Self-pay | Admitting: *Deleted

## 2023-08-07 ENCOUNTER — Encounter (HOSPITAL_BASED_OUTPATIENT_CLINIC_OR_DEPARTMENT_OTHER): Payer: Self-pay | Admitting: Family Medicine

## 2023-08-07 VITALS — BP 104/78 | HR 65 | Wt 222.4 lb

## 2023-08-07 DIAGNOSIS — R7401 Elevation of levels of liver transaminase levels: Secondary | ICD-10-CM | POA: Diagnosis not present

## 2023-08-07 DIAGNOSIS — R059 Cough, unspecified: Secondary | ICD-10-CM

## 2023-08-07 NOTE — Telephone Encounter (Signed)
   Pre-operative Risk Assessment    Patient Name: Kirk [name]  DOB: [birth year] MRN: [id number]  DATE OF LAST VISIT: 06/01/23 TELE VISIT PRE OP; 07/17/22 DR. SKAINS DATE OF NEXT VISIT: 08/30/23 DR. SKAINS    Request for Surgical Clearance    Procedure:   LEFT TOTAL KNEE ARTHROPLASTY  Date of Surgery:  Clearance 11/05/23                                 Surgeon:  DR. Ollen Gross Surgeon's Group or Practice Name:  [name] Phone number:  [phone] [name] Fax number:  [phone]   Type of Clearance Requested:   - Medical  - Pharmacy:  Hold Aspirin and Apixaban (Eliquis)     Type of Anesthesia:   CHOICE   Additional requests/questions:    [name]   08/07/2023, 6:01 PM

## 2023-08-07 NOTE — Progress Notes (Signed)
    Procedures performed today:    None.  Independent interpretation of notes and tests performed by another provider:   None.  Brief History, Exam, Impression, and Recommendations:    BP 104/78 (BP Location: Right Arm, Patient Position: Sitting, Cuff Size: Normal)   Pulse 65   Wt 222 lb 6.4 oz (100.9 kg)   SpO2 97%   BMI 31.02 kg/m   Cough, unspecified type Assessment & Plan: Patient was seen recently at urgent care related to cough and associated shortness of breath.  He had x-ray completed at urgent care and was felt to have consolidative process on x-ray and was started on antibiotics to cover for pneumonia.  Reviewed by radiologist indicated that they did not feel that consolidative process was present. Presently, patient has been doing well, does not have any concerns today. On exam, patient is in no acute distress, vital signs stable.  Lungs clear to auscultation bilaterally today. Given progress, can continue to monitor moving forward.  Based upon radiology report, do not feel that further chest x-ray imaging is needed at this time.   Transaminitis -     Hepatic function panel  Recent labs with slight elevation in AST and ALT.  We will proceed with laboratory follow-up today to ensure that these have resolved.   ___________________________________________ Jordane Hisle [name], MD, ABFM, CAQSM Primary Care and Sports Medicine [facility]

## 2023-08-08 LAB — HEPATIC FUNCTION PANEL
ALT: 26 [IU]/L (ref 0–44)
AST: 33 [IU]/L (ref 0–40)
Albumin: 4.5 g/dL (ref 3.8–4.8)
Alkaline Phosphatase: 99 [IU]/L (ref 44–121)
Bilirubin Total: 0.4 mg/dL (ref 0.0–1.2)
Bilirubin, Direct: 0.16 mg/dL (ref 0.00–0.40)
Total Protein: 7.5 g/dL (ref 6.0–8.5)

## 2023-08-08 NOTE — Telephone Encounter (Signed)
   Name: Kirk [name]  DOB: 06-07-1946  MRN: [id number]  Primary Cardiologist: Donato Schultz, MD  Chart reviewed as part of pre-operative protocol coverage. Patient has not been seen by our office since 07/17/2022. Therefore, he will require a follow-up in-office visit in order to better assess preoperative cardiovascular risk. He already has an office visit scheduled for 08/30/2023 with Dr. Anne Fu; therefore, pre-op risk assessment can be addressed this. I will route this note to Dr. Anne Fu and add "PRE-OP EVAL" to that appointment note so that he is aware.   In regards to recommendations for holding antiplatelet and anticoagulation therapies: Patient is on Eliquis for prior PE/ DVT which has been managed by Dr. Truett Perna (Hem-Onc). Therefore, would defer recommendations for holding this to Hem-Onc. However, it should be okay to hold Aspirin for 5-7 days if necessary prior to procedure.   I will remove from pre-op pool.   Corrin Parker, PA-C  08/08/2023, 7:39 AM

## 2023-08-09 DIAGNOSIS — L538 Other specified erythematous conditions: Secondary | ICD-10-CM | POA: Diagnosis not present

## 2023-08-09 DIAGNOSIS — L2989 Other pruritus: Secondary | ICD-10-CM | POA: Diagnosis not present

## 2023-08-09 DIAGNOSIS — L814 Other melanin hyperpigmentation: Secondary | ICD-10-CM | POA: Diagnosis not present

## 2023-08-09 DIAGNOSIS — L570 Actinic keratosis: Secondary | ICD-10-CM | POA: Diagnosis not present

## 2023-08-09 DIAGNOSIS — Z85828 Personal history of other malignant neoplasm of skin: Secondary | ICD-10-CM | POA: Diagnosis not present

## 2023-08-09 DIAGNOSIS — D492 Neoplasm of unspecified behavior of bone, soft tissue, and skin: Secondary | ICD-10-CM | POA: Diagnosis not present

## 2023-08-09 DIAGNOSIS — B078 Other viral warts: Secondary | ICD-10-CM | POA: Diagnosis not present

## 2023-08-09 DIAGNOSIS — Z08 Encounter for follow-up examination after completed treatment for malignant neoplasm: Secondary | ICD-10-CM | POA: Diagnosis not present

## 2023-08-17 ENCOUNTER — Other Ambulatory Visit (HOSPITAL_BASED_OUTPATIENT_CLINIC_OR_DEPARTMENT_OTHER): Payer: Self-pay | Admitting: Family Medicine

## 2023-08-17 DIAGNOSIS — E119 Type 2 diabetes mellitus without complications: Secondary | ICD-10-CM

## 2023-08-20 ENCOUNTER — Telehealth (HOSPITAL_BASED_OUTPATIENT_CLINIC_OR_DEPARTMENT_OTHER): Payer: Self-pay | Admitting: *Deleted

## 2023-08-20 ENCOUNTER — Other Ambulatory Visit (HOSPITAL_BASED_OUTPATIENT_CLINIC_OR_DEPARTMENT_OTHER): Payer: Medicare Other

## 2023-08-20 NOTE — Telephone Encounter (Signed)
Patient came by to have labs drawn. Please advise which labs you would like to order for patient

## 2023-08-21 ENCOUNTER — Other Ambulatory Visit (HOSPITAL_BASED_OUTPATIENT_CLINIC_OR_DEPARTMENT_OTHER): Payer: Self-pay | Admitting: Family Medicine

## 2023-08-21 DIAGNOSIS — Z0000 Encounter for general adult medical examination without abnormal findings: Secondary | ICD-10-CM | POA: Diagnosis not present

## 2023-08-22 LAB — LIPID PANEL
Chol/HDL Ratio: 3.4 {ratio} (ref 0.0–5.0)
Cholesterol, Total: 130 mg/dL (ref 100–199)
HDL: 38 mg/dL — ABNORMAL LOW (ref >39)
LDL Chol Calc (NIH): 66 mg/dL (ref 0–99)
Triglycerides: 148 mg/dL (ref 0–149)
VLDL Cholesterol Cal: 26 mg/dL (ref 5–40)

## 2023-08-22 LAB — CBC WITH DIFFERENTIAL/PLATELET
Basophils Absolute: 0 10*3/uL (ref 0.0–0.2)
Basos: 1 %
EOS (ABSOLUTE): 0.3 10*3/uL (ref 0.0–0.4)
Eos: 4 %
Hematocrit: 49.8 % (ref 37.5–51.0)
Hemoglobin: 15.5 g/dL (ref 13.0–17.7)
Immature Grans (Abs): 0.1 10*3/uL (ref 0.0–0.1)
Immature Granulocytes: 1 %
Lymphocytes Absolute: 1.1 10*3/uL (ref 0.7–3.1)
Lymphs: 19 %
MCH: 29.4 pg (ref 26.6–33.0)
MCHC: 31.1 g/dL — ABNORMAL LOW (ref 31.5–35.7)
MCV: 95 fL (ref 79–97)
Monocytes Absolute: 0.7 10*3/uL (ref 0.1–0.9)
Monocytes: 12 %
Neutrophils Absolute: 3.6 10*3/uL (ref 1.4–7.0)
Neutrophils: 63 %
Platelets: 222 10*3/uL (ref 150–450)
RBC: 5.27 x10E6/uL (ref 4.14–5.80)
RDW: 14.2 % (ref 11.6–15.4)
WBC: 5.7 10*3/uL (ref 3.4–10.8)

## 2023-08-22 LAB — TSH RFX ON ABNORMAL TO FREE T4: TSH: 11.1 u[IU]/mL — ABNORMAL HIGH (ref 0.450–4.500)

## 2023-08-22 LAB — HEMOGLOBIN A1C
Est. average glucose Bld gHb Est-mCnc: 148 mg/dL
Hgb A1c MFr Bld: 6.8 % — ABNORMAL HIGH (ref 4.8–5.6)

## 2023-08-22 LAB — COMPREHENSIVE METABOLIC PANEL
ALT: 27 [IU]/L (ref 0–44)
AST: 25 [IU]/L (ref 0–40)
Albumin: 4.1 g/dL (ref 3.8–4.8)
Alkaline Phosphatase: 97 [IU]/L (ref 44–121)
BUN/Creatinine Ratio: 13 (ref 10–24)
BUN: 15 mg/dL (ref 8–27)
Bilirubin Total: 0.3 mg/dL (ref 0.0–1.2)
CO2: 24 mmol/L (ref 20–29)
Calcium: 9.7 mg/dL (ref 8.6–10.2)
Chloride: 101 mmol/L (ref 96–106)
Creatinine, Ser: 1.2 mg/dL (ref 0.76–1.27)
Globulin, Total: 2.8 g/dL (ref 1.5–4.5)
Glucose: 126 mg/dL — ABNORMAL HIGH (ref 70–99)
Potassium: 4.4 mmol/L (ref 3.5–5.2)
Sodium: 141 mmol/L (ref 134–144)
Total Protein: 6.9 g/dL (ref 6.0–8.5)
eGFR: 63 mL/min/{1.73_m2} (ref >59)

## 2023-08-22 LAB — T4F: T4,Free (Direct): 0.81 ng/dL — ABNORMAL LOW (ref 0.82–1.77)

## 2023-08-24 ENCOUNTER — Other Ambulatory Visit (HOSPITAL_BASED_OUTPATIENT_CLINIC_OR_DEPARTMENT_OTHER): Payer: Self-pay | Admitting: Family Medicine

## 2023-08-24 DIAGNOSIS — E1142 Type 2 diabetes mellitus with diabetic polyneuropathy: Secondary | ICD-10-CM

## 2023-08-30 ENCOUNTER — Encounter: Payer: Self-pay | Admitting: Cardiology

## 2023-08-30 ENCOUNTER — Ambulatory Visit: Payer: Medicare Other | Attending: Cardiology | Admitting: Cardiology

## 2023-08-30 VITALS — BP 116/74 | HR 69 | Ht 71.0 in | Wt 220.0 lb

## 2023-08-30 DIAGNOSIS — Z86711 Personal history of pulmonary embolism: Secondary | ICD-10-CM | POA: Diagnosis not present

## 2023-08-30 DIAGNOSIS — Z951 Presence of aortocoronary bypass graft: Secondary | ICD-10-CM | POA: Diagnosis not present

## 2023-08-30 DIAGNOSIS — Z952 Presence of prosthetic heart valve: Secondary | ICD-10-CM | POA: Diagnosis not present

## 2023-08-30 DIAGNOSIS — I2510 Atherosclerotic heart disease of native coronary artery without angina pectoris: Secondary | ICD-10-CM

## 2023-08-30 NOTE — Patient Instructions (Signed)
Medication Instructions:  The current medical regimen is effective;  continue present plan and medications.  *If you need a refill on your cardiac medications before your next appointment, please call your pharmacy*  Testing/Procedures: Your physician has requested that you have an echocardiogram. Echocardiography is a painless test that uses sound waves to create images of your heart. It provides your doctor with information about the size and shape of your heart and how well your heart's chambers and valves are working. This procedure takes approximately one hour. There are no restrictions for this procedure. Please do NOT wear cologne, perfume, aftershave, or lotions (deodorant is allowed). Please arrive 15 minutes prior to your appointment time.  Please note: We ask at that you not bring children with you during ultrasound (echo/ vascular) testing. Due to room size and safety concerns, children are not allowed in the ultrasound rooms during exams. Our front office staff cannot provide observation of children in our lobby area while testing is being conducted. An adult accompanying a patient to their appointment will only be allowed in the ultrasound room at the discretion of the ultrasound technician under special circumstances. We apologize for any inconvenience.  Follow-Up: At [facility], you and your health needs are our priority.  As part of our continuing mission to provide you with exceptional heart care, we have created designated Provider Care Teams.  These Care Teams include your primary Cardiologist (physician) and Advanced Practice Providers (APPs -  Physician Assistants and Nurse Practitioners) who all work together to provide you with the care you need, when you need it.  We recommend signing up for the patient portal called "MyChart".  Sign up information is provided on this After Visit Summary.  MyChart is used to connect with patients for Virtual Visits (Telemedicine).   Patients are able to view lab/test results, encounter notes, upcoming appointments, etc.  Non-urgent messages can be sent to your provider as well.   To learn more about what you can do with MyChart, go to [email or web address].    Your next appointment:   1 year(s)  Provider:   Donato Schultz, MD      OK for surgery in March 2025.  Please hold Eliquis 3 days prior  to surgery and resume as soon as surgeon has approved.

## 2023-08-30 NOTE — Progress Notes (Signed)
Cardiology Office Note:  .   Date:  08/30/2023  ID:  Kirk [name], DOB Nov 20, 1945, MRN [id number] PCP: [name], Raymond J, MD  [facility] Providers Cardiologist:  Donato Schultz, MD     History of Present Illness: .   Kirk [name] is a 77 y.o. male Discussed with the use of AI scribe   History of Present Illness   The patient is a 77 year old male with a history of aortic valve replacement, single vessel bypass, squamous cell carcinoma of the rectum (now in remission), and acute DVT/PE in the right femoral, popliteal, peroneal, neo, and gastrocnemius veins.    The patient is currently on Eliquis 2.5 mg twice daily for maintenance therapy due to the prior large PE DVT and history of squamous cell carcinoma.  The patient recently underwent a successful knee replacement surgery and is scheduled for another knee replacement surgery. The patient reported no recent health difficulties and has been compliant with the prescribed Eliquis regimen. The patient has been experiencing some difficulty with mobility, particularly in navigating slopes, which has [place]  The patient has been diligent in his post-operative care, utilizing an incumbent bike and a belt for exercises to improve knee flexibility. The patient reported a quick recovery from the previous knee surgery, with the knee almost completely straight and a good range of motion.  The patient also reported a need for a new urologist due to dissatisfaction with the current care. The patient did not elaborate on the specific urological issues he is experiencing.  The patient's recent labs showed an LDL of 66, Hemoglobin A1c of 6.8, Creatinine of 1.2, and Hemoglobin of 13.8 in December of 2024.    The patient's last echocardiogram in August 2023 showed an EF of 70%, mild LVH, moderately dilated left atria, and a 25mm magna ease pericardial valve with a mean gradient of 16mm of mercury. The patient's  vascular ultrasound ABI of the lower extremities was normal for large vessels, but the toe brachial index was abnormal. There was no evidence of left-sided DVT on the venous study.         [place] granddaughter at [employer]   Studies Reviewed: Marland Kitchen   EKG Interpretation Date/Time:  Thursday August 30 2023 09:16:32 EST Ventricular Rate:  67 PR Interval:  212 QRS Duration:  106 QT Interval:  416 QTC Calculation: 439 R Axis:   15  Text Interpretation: Sinus rhythm with 1st degree A-V block Nonspecific ST and T wave abnormality Poor R wave progression When compared with ECG of 25-Apr-2022 20:20, No significant change since last tracing Confirmed by Donato Schultz ([id number]) on 08/30/2023 9:21:43 AM    Results   LABS LDL: 66 (08/2023) Hemoglobin A1c: 6.8 (08/2023) Creatinine: 1.2 (08/2023) Hemoglobin: 13.8 (08/2023)  RADIOLOGY CTA of the chest: Acute saddle pulmonary embolism of main pulmonary artery, left and right pulmonary arteries with evidence of right heart strain (04/2022)  DIAGNOSTIC Echocardiogram: Ejection fraction 70%, mild left ventricular hypertrophy, moderately dilated left atrium, 25 mm Magna Ease pericardial valve with mean gradient of 16 mmHg (04/27/2022) Vascular ultrasound ABI: Normal for large vessels, abnormal toe brachial index (04/27/2022) Venous study: No evidence of left-sided deep vein thrombosis, acute deep vein thrombosis in right femoral vein, right popliteal vein, right peroneal vein, and right gastrocnemius veins (04/27/2022)     Risk Assessment/Calculations:            Physical Exam:   VS:  BP  116/74 (BP Location: Left Arm, Patient Position: Sitting)   Pulse 69   Ht 5\' 11"  (1.803 m)   Wt 220 lb (99.8 kg)   SpO2 95%   BMI 30.68 kg/m    Wt Readings from Last 3 Encounters:  08/30/23 220 lb (99.8 kg)  08/07/23 222 lb 6.4 oz (100.9 kg)  06/18/23 222 lb (100.7 kg)    GEN: Well nourished, well developed in no acute distress NECK: No JVD; No  carotid bruits CARDIAC: RRR, no murmurs, no rubs, no gallops RESPIRATORY:  Clear to auscultation without rales, wheezing or rhonchi  ABDOMEN: Soft, non-tender, non-distended EXTREMITIES:  No edema; No deformity knee scar right healed. Right compression hose  ASSESSMENT AND PLAN: .    Assessment and Plan    Preoperative Evaluation for Knee Replacement 77 year old with aortic valve replacement, here for preoperative evaluation for knee replacement surgery on November 05, 2022. No recent difficulties. Compliant with Eliquis therapy. Discussed holding Eliquis for three days prior to surgery and resuming postoperatively. Informed about spinal anesthetic and postoperative rehabilitation. - Proceed with knee surgery on November 05, 2022 - Hold Eliquis for three days prior to surgery - Resume Eliquis postoperatively  Deep Vein Thrombosis (DVT) and Pulmonary Embolism (PE) Acute DVT in right femoral, popliteal, perineum, neo vein, and gastrocnemius veins, and acute saddle PE in main pulmonary artery, left and right pulmonary arteries with right heart strain, diagnosed in August 2023. Currently on Eliquis 2.5 mg twice daily for maintenance therapy. Discussed necessity of continued anticoagulation therapy due to prior large PE and DVT. - Continue Eliquis 2.5 mg twice daily for maintenance therapy  Aortic Valve Replacement Follow-up Aortic valve replacement in 2012 with a 25 mm Magna Ease pericardial valve. Last echocardiogram on 04/27/2022 showed EF of 70%, mild LVH, moderately dilated left atrium, and mean gradient of 16 mmHg across the valve. Valve function is well-managed. Discussed importance of regular monitoring due to valve age. 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 Treated with chemoradiation at [facility]. Cancer in remission. Regular  follow-up with oncology as needed. - Continue regular follow-up with oncology as needed  General Health Maintenance LDL 66 mg/dL, Hemoglobin I6N 6.2%, Creatinine 1.2 mg/dL, Hemoglobin 95.2 g/dL in December 8413. Compliant with current medications and lifestyle modifications. - Continue current medications and lifestyle modifications  Follow-up - Schedule follow-up appointment for echocardiogram - Ensure preoperative clearance for knee surgery.              Signed, Donato Schultz, MD

## 2023-09-04 ENCOUNTER — Encounter (HOSPITAL_BASED_OUTPATIENT_CLINIC_OR_DEPARTMENT_OTHER): Payer: Self-pay | Admitting: Family Medicine

## 2023-09-04 ENCOUNTER — Ambulatory Visit (INDEPENDENT_AMBULATORY_CARE_PROVIDER_SITE_OTHER): Payer: Medicare Other | Admitting: Family Medicine

## 2023-09-04 VITALS — BP 154/73 | HR 87 | Ht 71.0 in | Wt 221.5 lb

## 2023-09-04 DIAGNOSIS — J029 Acute pharyngitis, unspecified: Secondary | ICD-10-CM | POA: Diagnosis not present

## 2023-09-04 DIAGNOSIS — U071 COVID-19: Secondary | ICD-10-CM | POA: Diagnosis not present

## 2023-09-04 LAB — POC COVID19 BINAXNOW: SARS Coronavirus 2 Ag: POSITIVE — AB

## 2023-09-04 NOTE — Progress Notes (Signed)
   Acute Office Visit  Subjective:     Patient ID: Kirk [name], male    DOB: 1945-10-26, 77 y.o.   MRN: [id number]  Chief Complaint  Patient presents with   Covid Exposure    Wife has covid, Scratchy throat and a slight cough, runny nose, chest rattling denies fever   Kirk [name] is a 77 year old male patient who presents today for an acute visit.  Presents today for an acute visit with complaint of sore throat, non-productive cough, rhinorrhea.  Symptoms have been present since Monday. He reports that his wife started to feel symptomatic on Sunday [place].  Pertinent negatives: fever/chills, shortness of breath, wheezing, chest pain, dyspnea, headache, dizziness Sick contacts : yes, wife has covid   ROS: see HPI     Objective:    BP (!) 154/73   Pulse 87   Ht 5' 11 (1.803 m)   Wt 221 lb 8 oz (100.5 kg)   SpO2 98%   BMI 30.89 kg/m   Physical Exam Vitals reviewed.  Constitutional:      Appearance: Normal appearance.  Cardiovascular:     Rate and Rhythm: Normal rate and regular rhythm.     Pulses: Normal pulses.     Heart sounds: Normal heart sounds.  Pulmonary:     Effort: Pulmonary effort is normal.     Breath sounds: Normal breath sounds.  Neurological:     Mental Status: He is alert.  Psychiatric:        Mood and Affect: Mood normal.        Behavior: Behavior normal.    Assessment & Plan:   1. Acute sore throat (Primary) Edouard Gikas is a 77 year old male patient who presents today for an acute visit with complaints of sore throat, non-productive cough, and rhinorrhea. He denies fever/chills, body aches, N/V, abdominal pain, shortness of breath, wheezing, chest pain and palpitations. POC COVID test came back positive. Discussed symptomatic treatment and when to seek emergency care. Discussed viral medication with patient- he would like to discuss this with his wife. Will reach out to office if he would like medication  sent in. - POC COVID-19 BinaxNow  2. Clinical diagnosis of COVID-19 See #1  Return if symptoms worsen or fail to improve.  Evalene Arts, FNP

## 2023-09-04 NOTE — Patient Instructions (Signed)
COVID-19 positive:  Influenza positive:  Stay home and away from others until symptoms are improving and fever free for 24 hours without the use of tylenol or ibuprofen. If symptoms have improved and fever resolved, then can be in public setting, but should wear a mask around others, practice safe distancing, and perform hand hygiene for an additional 5 days. If symptoms worsen, or the patient develop shortness of breath, pulse oximeter reading of < 90%, or chest pain, seek immediate care at nearest emergency department or call 911.   Vitamin Regimen:  Vitamin C 500mg  twice daily  Vitamin D 5000 units once daily  Zinc 50-75mg  once daily   Over the counter Medications:  Aspirin 81mg  per day * unless allergic or contraindicated*  Use Tylenol (acetaminophen) for fever *unless allergic or contraindicated*    Non-Medication Therapy:  Drink plenty of fluids, warm if possible.   A teaspoon of honey may help ease coughing symptoms.   Cough drops or hard candy for coughing.   Over the Counter Medication Therapy:  Use a cough expectorant such as guaifenesin (Mucinex) if recommended by your doctor for a wet, congested cough. If you have high blood pressure, please ask your doctor first before using this.   Use a cough suppressant such as dextromethorphan (Robitussin/Delsym) for a dry cough. If you have high blood pressure, please ask your doctor first before using this.   If you have high blood pressure, medication such as Coricidin HBP is safe to take for your cough and will not increase your blood pressure.

## 2023-09-10 ENCOUNTER — Encounter (HOSPITAL_BASED_OUTPATIENT_CLINIC_OR_DEPARTMENT_OTHER): Payer: Medicare Other | Admitting: Family Medicine

## 2023-09-12 DIAGNOSIS — K080 Exfoliation of teeth due to systemic causes: Secondary | ICD-10-CM | POA: Diagnosis not present

## 2023-09-27 ENCOUNTER — Encounter (HOSPITAL_BASED_OUTPATIENT_CLINIC_OR_DEPARTMENT_OTHER): Payer: Self-pay | Admitting: Family Medicine

## 2023-09-27 ENCOUNTER — Ambulatory Visit (INDEPENDENT_AMBULATORY_CARE_PROVIDER_SITE_OTHER): Payer: Medicare Other | Admitting: Family Medicine

## 2023-09-27 VITALS — BP 116/60 | HR 66 | Ht 70.0 in | Wt 216.6 lb

## 2023-09-27 DIAGNOSIS — M25571 Pain in right ankle and joints of right foot: Secondary | ICD-10-CM | POA: Diagnosis not present

## 2023-09-27 DIAGNOSIS — L84 Corns and callosities: Secondary | ICD-10-CM | POA: Insufficient documentation

## 2023-09-27 DIAGNOSIS — Q6689 Other  specified congenital deformities of feet: Secondary | ICD-10-CM | POA: Diagnosis not present

## 2023-09-27 DIAGNOSIS — M25579 Pain in unspecified ankle and joints of unspecified foot: Secondary | ICD-10-CM | POA: Insufficient documentation

## 2023-09-27 NOTE — Assessment & Plan Note (Signed)
Patient is been present for a couple weeks now, primarily located just posterior to medial malleolus.  Has not had any swelling or overlying skin changes observed.  Will note pain with driving when having to work the pedals as well as at the end of dorsiflexion range of motion.  No significant pain with walking. On exam, no obvious abnormality on inspection.  Does have tenderness to palpation about midway between the medial malleolus and Achilles tendon, extending some proximally.  No tenderness to palpation extending distally.  No pain with resisted plantarflexion of foot or great toe. Exam with primarily suggest tendinitis as cause of current symptoms, however would expect more pain with walking and with resisted plantarflexion, which patient does not currently have.  At this time, can continue with conservative measures, can utilize topical treatments, including Voltaren gel.  If symptoms not gradually improving, consider further evaluation with imaging, possible treatment with physical therapy

## 2023-09-27 NOTE — Patient Instructions (Signed)
  Medication Instructions:  Your physician recommends that you continue on your current medications as directed. Please refer to the Current Medication list given to you today. --If you need a refill on any your medications before your next appointment, please call your pharmacy first. If no refills are authorized on file call the office.-- Follow-Up: Your next appointment:   Your physician recommends that you schedule a follow-up appointment in: as needed with Dr. de [place]  You will receive a text message or e-mail with a link to a survey about your care and experience with us today! We would greatly appreciate your feedback!   Thanks for letting us be apart of your health journey!!  Primary Care and Sports Medicine   Dr. Raymond de [place]   We encourage you to activate your patient portal called "MyChart".  Sign up information is provided on this After Visit Summary.  MyChart is used to connect with patients for Virtual Visits (Telemedicine).  Patients are able to view lab/test results, encounter notes, upcoming appointments, etc.  Non-urgent messages can be sent to your provider as well. To learn more about what you can do with MyChart, please visit --  [email or web address].    

## 2023-09-27 NOTE — Progress Notes (Signed)
    Procedures performed today:    None.  Independent interpretation of notes and tests performed by another provider:   None.  Brief History, Exam, Impression, and Recommendations:    BP 116/60   Pulse 66   Ht 5\' 10"  (1.778 m)   Wt 216 lb 9.6 oz (98.2 kg)   SpO2 91%   BMI 31.08 kg/m   Morton's toe of right foot Assessment & Plan: Patient reports having recent issue of redness extending from second toe on right foot proximally.  He has had irritation of distal end of toe due to it being longer than his first toe and rubbing more inside of his shoe.  He has met with a podiatrist in the past who did provide nail care and recommendations for the foot, however patient has some hesitations regarding this provider.  Patient does note that toe and foot are improved today compared to yesterday.  He has tried different shoewear as well as different sizes without finding adequate relief. On exam, tip of right second toe with callus formation with gray appearance extending some into/under tip of toenail.  Does not have any significant erythema along the second toe or extending into foot.  No significant tenderness to palpation along foot or toe.  No current discharge or drainage from area of irritation at tip of toe.  Second toe is longer than first toe. Discussed that structure of Morton's toe does lead to more friction and irritation of this toe within the shoe.  We discussed considerations including sizing up in shoe or use of barrier methods such as moleskin over the tip of the toe to help reduce friction at this site.  No current signs of infection.  Patient will utilize conservative measures and we will follow-up on progress.  If not responding as expected, consider further evaluation with specialist, could consider referral to alternative podiatrist or to meet with foot and ankle orthopedic specialist.   Acute right ankle pain Assessment & Plan: Patient is been present for a couple weeks  now, primarily located just posterior to medial malleolus.  Has not had any swelling or overlying skin changes observed.  Will note pain with driving when having to work the pedals as well as at the end of dorsiflexion range of motion.  No significant pain with walking. On exam, no obvious abnormality on inspection.  Does have tenderness to palpation about midway between the medial malleolus and Achilles tendon, extending some proximally.  No tenderness to palpation extending distally.  No pain with resisted plantarflexion of foot or great toe. Exam with primarily suggest tendinitis as cause of current symptoms, however would expect more pain with walking and with resisted plantarflexion, which patient does not currently have.  At this time, can continue with conservative measures, can utilize topical treatments, including Voltaren gel.  If symptoms not gradually improving, consider further evaluation with imaging, possible treatment with physical therapy   Return if symptoms worsen or fail to improve.   ___________________________________________ Hersel Mcmeen [name], MD, ABFM, [facility] Primary Care and Sports Medicine [facility]

## 2023-09-27 NOTE — Assessment & Plan Note (Signed)
Patient reports having recent issue of redness extending from second toe on right foot proximally.  He has had irritation of distal end of toe due to it being longer than his first toe and rubbing more inside of his shoe.  He has met with a podiatrist in the past who did provide nail care and recommendations for the foot, however patient has some hesitations regarding this provider.  Patient does note that toe and foot are improved today compared to yesterday.  He has tried different shoewear as well as different sizes without finding adequate relief. On exam, tip of right second toe with callus formation with gray appearance extending some into/under tip of toenail.  Does not have any significant erythema along the second toe or extending into foot.  No significant tenderness to palpation along foot or toe.  No current discharge or drainage from area of irritation at tip of toe.  Second toe is longer than first toe. Discussed that structure of Morton's toe does lead to more friction and irritation of this toe within the shoe.  We discussed considerations including sizing up in shoe or use of barrier methods such as moleskin over the tip of the toe to help reduce friction at this site.  No current signs of infection.  Patient will utilize conservative measures and we will follow-up on progress.  If not responding as expected, consider further evaluation with specialist, could consider referral to alternative podiatrist or to meet with foot and ankle orthopedic specialist.

## 2023-10-01 ENCOUNTER — Telehealth (HOSPITAL_BASED_OUTPATIENT_CLINIC_OR_DEPARTMENT_OTHER): Payer: Self-pay

## 2023-10-01 ENCOUNTER — Ambulatory Visit (HOSPITAL_COMMUNITY): Payer: Medicare Other | Attending: Cardiology

## 2023-10-01 DIAGNOSIS — Z952 Presence of prosthetic heart valve: Secondary | ICD-10-CM | POA: Diagnosis not present

## 2023-10-01 LAB — ECHOCARDIOGRAM COMPLETE
AR max vel: 0.94 cm2
AV Area VTI: 0.9 cm2
AV Area mean vel: 0.92 cm2
AV Mean grad: 21 mm[Hg]
AV Peak grad: 37.9 mm[Hg]
Ao pk vel: 3.08 m/s
Area-P 1/2: 2.12 cm2
S' Lateral: 3 cm

## 2023-10-01 NOTE — Telephone Encounter (Signed)
[name] is calling to advise that Kirk [name] has increasing Urination for the past 2-3 mths and it is bothersome.  They are wanting to know if there is something else they should try or should they increase the dose of mybetrix  Please advise

## 2023-10-02 ENCOUNTER — Encounter (HOSPITAL_BASED_OUTPATIENT_CLINIC_OR_DEPARTMENT_OTHER): Payer: Self-pay

## 2023-10-02 NOTE — Telephone Encounter (Signed)
Sent a message to the pt via Mychart, with Dr Ihor Dow recommendations

## 2023-10-03 ENCOUNTER — Encounter: Payer: Self-pay | Admitting: Cardiology

## 2023-10-04 ENCOUNTER — Other Ambulatory Visit (HOSPITAL_BASED_OUTPATIENT_CLINIC_OR_DEPARTMENT_OTHER): Payer: Self-pay | Admitting: Family Medicine

## 2023-10-04 DIAGNOSIS — E119 Type 2 diabetes mellitus without complications: Secondary | ICD-10-CM

## 2023-10-04 DIAGNOSIS — R059 Cough, unspecified: Secondary | ICD-10-CM | POA: Insufficient documentation

## 2023-10-04 NOTE — Assessment & Plan Note (Signed)
Patient was seen recently at urgent care related to cough and associated shortness of breath.  He had x-ray completed at urgent care and was felt to have consolidative process on x-ray and was started on antibiotics to cover for pneumonia.  Reviewed by radiologist indicated that they did not feel that consolidative process was present. Presently, patient has been doing well, does not have any concerns today. On exam, patient is in no acute distress, vital signs stable.  Lungs clear to auscultation bilaterally today. Given progress, can continue to monitor moving forward.  Based upon radiology report, do not feel that further chest x-ray imaging is needed at this time.

## 2023-10-08 ENCOUNTER — Other Ambulatory Visit: Payer: Self-pay | Admitting: *Deleted

## 2023-10-08 DIAGNOSIS — Z952 Presence of prosthetic heart valve: Secondary | ICD-10-CM

## 2023-10-08 DIAGNOSIS — I350 Nonrheumatic aortic (valve) stenosis: Secondary | ICD-10-CM

## 2023-10-10 ENCOUNTER — Ambulatory Visit (HOSPITAL_BASED_OUTPATIENT_CLINIC_OR_DEPARTMENT_OTHER): Payer: Medicare Other | Admitting: Family Medicine

## 2023-10-10 ENCOUNTER — Encounter (HOSPITAL_BASED_OUTPATIENT_CLINIC_OR_DEPARTMENT_OTHER): Payer: Self-pay | Admitting: Family Medicine

## 2023-10-10 VITALS — BP 118/61 | HR 72 | Ht 70.0 in | Wt 215.0 lb

## 2023-10-10 DIAGNOSIS — R350 Frequency of micturition: Secondary | ICD-10-CM | POA: Diagnosis not present

## 2023-10-10 DIAGNOSIS — Z0000 Encounter for general adult medical examination without abnormal findings: Secondary | ICD-10-CM | POA: Diagnosis not present

## 2023-10-10 DIAGNOSIS — E119 Type 2 diabetes mellitus without complications: Secondary | ICD-10-CM

## 2023-10-10 DIAGNOSIS — E039 Hypothyroidism, unspecified: Secondary | ICD-10-CM | POA: Diagnosis not present

## 2023-10-10 DIAGNOSIS — Z7984 Long term (current) use of oral hypoglycemic drugs: Secondary | ICD-10-CM

## 2023-10-10 MED ORDER — LEVOTHYROXINE SODIUM 25 MCG PO TABS: 25.0000 ug | ORAL_TABLET | Freq: Every day | ORAL | 1 refills | Status: DC

## 2023-10-10 MED ORDER — FARXIGA 5 MG PO TABS: 5.0000 mg | ORAL_TABLET | Freq: Every day | ORAL | 1 refills | Status: DC

## 2023-10-10 NOTE — Assessment & Plan Note (Signed)
 Recent labs with elevated TSH and slightly low T4.  Discussed that TSH can increase as we age, however given degree of TSH elevation, would be reasonable to proceed with treatment as opposed to continued monitoring at this time.  Patient is amenable, we will start with low-dose of levothyroxine  initially with plan to recheck TSH and adjust dose of levothyroxine  as indicated

## 2023-10-10 NOTE — Assessment & Plan Note (Signed)
 Ongoing issue for patient, no associated dysuria or hematuria.  Has previously utilized Myrbetriq  with some relief, not as effective as when for starting medication.  No new medications which could be contributing to symptoms.  He does continue with Farxiga , however no recent dose change and has been on this medication for a number of years.  A1c recently did show good control of blood sugars, thus doubt that blood sugar could be impacting urinary frequency. After discussion, patient elected to proceed with referral to urology, feel this would be appropriate, referral placed today

## 2023-10-10 NOTE — Patient Instructions (Signed)
  Medication Instructions:  Your physician recommends that you continue on your current medications as directed. Please refer to the Current Medication list given to you today. --If you need a refill on any your medications before your next appointment, please call your pharmacy first. If no refills are authorized on file call the office.-- Lab Work: Your physician has recommended that you have lab work today: 2 months from 10/10/23 If you have labs (blood work) drawn today and your tests are completely normal, you will receive your results via MyChart message OR a phone call from our staff.  Please ensure you check your voicemail in the event that you authorized detailed messages to be left on a delegated number. If you have any lab test that is abnormal or we need to change your treatment, we will call you to review the results.  Follow-Up: Your next appointment:   Your physician recommends that you schedule a follow-up appointment in: 4-6 month follow up  with Dr. de [place]  You will receive a text message or e-mail with a link to a survey about your care and experience with us  today! We would greatly appreciate your feedback!   Thanks for letting us  be apart of your health journey!!  Primary Care and Sports Medicine   Dr. Quintin sheerer Cuba   We encourage you to activate your patient portal called MyChart.  Sign up information is provided on this After Visit Summary.  MyChart is used to connect with patients for Virtual Visits (Telemedicine).  Patients are able to view lab/test results, encounter notes, upcoming appointments, etc.  Non-urgent messages can be sent to your provider as well. To learn more about what you can do with MyChart, please visit --  [email or web address].

## 2023-10-10 NOTE — Progress Notes (Signed)
 Subjective:    CC: Annual Physical Exam  HPI:  Kirk [name] is a 78 y.o. presenting for annual physical  I reviewed the past medical history, family history, social history, surgical history, and allergies today and no changes were needed.  Please see the problem list section below in epic for further details.  Past Medical History: Past Medical History:  Diagnosis Date   Allergy    Anal cancer (HCC) 04/2012   Aortic stenosis    valve replacement   Arthritis    1995- cerv. 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   CHF (congestive heart failure) (HCC)    Coronary artery disease    aortic stenosis, CAD   Diabetes mellitus    Type 2   DVT (deep venous thrombosis) (HCC)    ED (erectile dysfunction)    Elevated cholesterol    Heart murmur    no problems   History of kidney stones    passed, lithrotrispy and surgery   History of radiation therapy 05/20/12-06/28/12   anal cancer=54gy total dose   Hypertension    Kidney stones    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   Malignant neoplasm of other sites of rectum, rectosigmoid junction, and anus 04/12/2012   Myocardial infarction (HCC) 2012   Personal history of colonic polyps 03/20/2012   Pulmonary embolism (HCC)    Rectal bleeding    Rectal mass 03/20/2012   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   #74mm [facility] Ease pericardial tissue valve    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   LIMA to diagonal branch    Seizure disorder, grand mal (HCC)    only 1   Seizures (HCC)    after taking Oxycodone ; not prescribed seizure med   Shortness of breath    Thrombosed hemorrhoids    Past Surgical History: Past Surgical History:  Procedure Laterality Date   ANAL FISSURE REPAIR     patient unsure of date   ANTERIOR CERVICAL DECOMP/DISCECTOMY FUSION N/A  01/22/2018   Procedure: ANTERIOR CERVICAL DECOMPRESSION/DISCECTOMY FUSION CERVICAL SIX- CERVICAL SEVEN;  Surgeon: Unice Pac, MD;  Location: [facility] OR;  Service: Neurosurgery;  Laterality: N/A;  ANTERIOR CERVICAL DECOMPRESSION/DISCECTOMY FUSION CERVICAL SIX- CERVICAL SEVEN   AORTIC VALVE REPLACEMENT  08/11/2011   Procedure: AORTIC VALVE REPLACEMENT (AVR);  Surgeon: Sudie VEAR Laine, MD;  Location: [facility] OR;  Service: Open Heart Surgery;  Laterality: N/A;   C4-6 FUSION  1995   DR STERN   COLONOSCOPY     hx polyps   CORONARY ARTERY BYPASS GRAFT  08/16/2011   Procedure: CORONARY ARTERY BYPASS GRAFTING (CABG);  Surgeon: Sudie VEAR Laine, MD;  Location: [facility] OR;  Service: Open Heart Surgery;  Laterality: N/A;  CABG times one using left internal mammary artery   LITHOTRIPSY  2001 AND 2002   DR PETERSON   POLYPECTOMY     colon   TOTAL KNEE ARTHROPLASTY Right 06/18/2023   Procedure: RIGHT TOTAL KNEE ARTHROPLASTY;  Surgeon: Melodi Lerner, MD;  Location: [facility] ORS;  Service: Orthopedics;  Laterality: Right;   Social History: Social History   Socioeconomic History   Marital status: Married    Spouse name: Sharman   Number of children: 2   Years of education: Not on file   Highest education level: Not on file  Occupational History  Occupation: Surveyor, minerals semi-retired  Tobacco Use   Smoking status: Never    Passive exposure: Never   Smokeless tobacco: Never  Vaping Use   Vaping status: Never Used  Substance and Sexual Activity   Alcohol  use: Yes    Comment: rarely   Drug use: No   Sexual activity: Not on file  Other Topics Concern   Not on file  Social History Narrative   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, Sharman   Self-employed painter   Social Drivers of Corporate Investment Banker Strain: Low Risk  (02/06/2023)   Overall Financial Resource Strain (CARDIA)    Difficulty of Paying Living Expenses: Not hard at all  Food Insecurity: No Food Insecurity (06/18/2023)   Hunger Vital Sign    Worried About Running Out  of Food in the Last Year: Never true    Ran Out of Food in the Last Year: Never true  Transportation Needs: No Transportation Needs (06/18/2023)   PRAPARE - Administrator, Civil Service (Medical): No    Lack of Transportation (Non-Medical): No  Physical Activity: Sufficiently Active (02/06/2023)   Exercise Vital Sign    Days of Exercise per Week: 7 days    Minutes of Exercise per Session: 30 min  Stress: No Stress Concern Present (02/06/2023)   [employer] of Occupational Health - Occupational Stress Questionnaire    Feeling of Stress : Not at all  Social Connections: Socially Integrated (02/06/2023)   Social Connection and Isolation Panel [NHANES]    Frequency of Communication with Friends and Family: More than three times a week    Frequency of Social Gatherings with Friends and Family: More than three times a week    Attends Religious Services: More than 4 times per year    Active Member of [employer] or Organizations: Yes    Attends Engineer, Structural: More than 4 times per year    Marital Status: Married   Family History: Family History  Problem Relation Age of Onset   Breast cancer Mother    Heart attack Father    Esophageal cancer Maternal Grandfather    Anesthesia problems Neg Hx    Hypotension Neg Hx    Malignant hyperthermia Neg Hx    Pseudochol deficiency Neg Hx    Colon cancer Neg Hx    Colon polyps Neg Hx    Stomach cancer Neg Hx    Rectal cancer Neg Hx    Allergies: Allergies  Allergen Reactions   Cat Dander Shortness Of Breath and Swelling   Hydrocodone  Other (See Comments)   Atorvastatin  Other (See Comments)    Pt has muscle ache on Atorvastatin  but IS ABLE TO TAKE LIPITOR  without any issues   Dust Mite Extract Other (See Comments) and Swelling   Metformin Other (See Comments)    Diarrhea, muscle cramps   Oxycodone  Other (See Comments)    Seizure PATIENT DOES NOT WANT ANY NARCOTICS   Semaglutide (0.25 Or 0.5mg -Dos) Nausea Only    AT  HIGHER DOSE   Medications: See med rec.  Review of Systems: No headache, visual changes, nausea, vomiting, diarrhea, constipation, dizziness, abdominal pain, skin rash, fevers, chills, night sweats, swollen lymph nodes, weight loss, chest pain, body aches, joint swelling, muscle aches, shortness of breath, mood changes, visual or auditory hallucinations.  Objective:    BP 118/61 (BP Location: Right Arm, Patient Position: Sitting, Cuff Size: Normal)   Pulse 72   Ht 5' 10 (1.778 m)   Wt 215 lb (97.5 kg)  SpO2 97%   BMI 30.85 kg/m   General: Well Developed, well nourished, and in no acute distress.  Neuro: Alert and oriented x3, extra-ocular muscles intact, sensation grossly intact. Cranial nerves II through XII are intact, motor, sensory, and coordinative functions are all intact. HEENT: Normocephalic, atraumatic, pupils equal round reactive to light, neck supple, no masses, no lymphadenopathy, thyroid  nonpalpable. Oropharynx, nasopharynx, external ear canals are unremarkable. Skin: Warm and dry, no rashes noted.  Cardiac: Regular rate and rhythm, no murmurs rubs or gallops.  Respiratory: Clear to auscultation bilaterally. Not using accessory muscles, speaking in full sentences.  Abdominal: Soft, nontender, nondistended, positive bowel sounds, no masses, no organomegaly.  Musculoskeletal: Shoulder, elbow, wrist, hip, knee, ankle stable, and with full range of motion.  Impression and Recommendations:    Wellness examination Assessment & Plan: Routine HCM labs reviewed. HCM reviewed/discussed. Anticipatory guidance regarding healthy weight, lifestyle and choices given. Recommend healthy diet.  Recommend approximately 150 minutes/week of moderate intensity exercise Recommend regular dental and vision exams Always use seatbelt/lap and shoulder restraints Recommend using smoke alarms and checking batteries at least twice a year Recommend using sunscreen when outside Discussed colon  cancer screening recommendations, options.  Patient is UTD Discussed immunization recommendations   Hypothyroidism, unspecified type Assessment & Plan: Recent labs with elevated TSH and slightly low T4.  Discussed that TSH can increase as we age, however given degree of TSH elevation, would be reasonable to proceed with treatment as opposed to continued monitoring at this time.  Patient is amenable, we will start with low-dose of levothyroxine  initially with plan to recheck TSH and adjust dose of levothyroxine  as indicated  Orders: -     Levothyroxine  Sodium; Take 1 tablet (25 mcg total) by mouth daily.  Dispense: 90 tablet; Refill: 1 -     TSH; Future  Type 2 diabetes mellitus without complication, without long-term current use of insulin  (HCC) -     Farxiga ; Take 1 tablet (5 mg total) by mouth daily.  Dispense: 90 tablet; Refill: 1  Urinary frequency Assessment & Plan: Ongoing issue for patient, no associated dysuria or hematuria.  Has previously utilized Myrbetriq  with some relief, not as effective as when for starting medication.  No new medications which could be contributing to symptoms.  He does continue with Farxiga , however no recent dose change and has been on this medication for a number of years.  A1c recently did show good control of blood sugars, thus doubt that blood sugar could be impacting urinary frequency. After discussion, patient elected to proceed with referral to urology, feel this would be appropriate, referral placed today  Orders: -     Ambulatory referral to Urology  Return in about 4 months (around 02/07/2024).   ___________________________________________ Tinnie Kunin de [place], MD, ABFM, CAQSM Primary Care and Sports Medicine [facility]

## 2023-10-10 NOTE — Assessment & Plan Note (Signed)
 Routine HCM labs reviewed. HCM reviewed/discussed. Anticipatory guidance regarding healthy weight, lifestyle and choices given. Recommend healthy diet.  Recommend approximately 150 minutes/week of moderate intensity exercise Recommend regular dental and vision exams Always use seatbelt/lap and shoulder restraints Recommend using smoke alarms and checking batteries at least twice a year Recommend using sunscreen when outside Discussed colon cancer screening recommendations, options.  Patient is UTD Discussed immunization recommendations

## 2023-10-17 DIAGNOSIS — R300 Dysuria: Secondary | ICD-10-CM | POA: Diagnosis not present

## 2023-10-17 DIAGNOSIS — N390 Urinary tract infection, site not specified: Secondary | ICD-10-CM | POA: Diagnosis not present

## 2023-10-17 DIAGNOSIS — R319 Hematuria, unspecified: Secondary | ICD-10-CM | POA: Diagnosis not present

## 2023-10-22 NOTE — H&P (Signed)
TOTAL KNEE ADMISSION H&P  Patient is being admitted for left total knee arthroplasty.  Subjective:  Chief Complaint: Left knee pain.  HPI: Kirk [name], 78 y.o. male has a history of pain and functional disability in the left knee due to arthritis and has failed non-surgical conservative treatments for greater than 12 weeks to include corticosteriod injections, viscosupplementation injections, and activity modification. Onset of symptoms was gradual, starting  several  years ago with gradually worsening course since that time. The patient noted no past surgery on the left knee.  Patient currently rates pain in the left knee at 7 out of 10 with activity. Patient has worsening of pain with activity and weight bearing and pain that interferes with activities of daily living. Patient has evidence of periarticular osteophytes and joint space narrowing by imaging studies. There is no active infection.  Patient Active Problem List   Diagnosis Date Noted   Wellness examination 10/10/2023   Urinary frequency 10/10/2023   Cough 10/04/2023   Morton's toe of right foot 09/27/2023   Ankle pain 09/27/2023   Stiffness of right knee 06/29/2023   Primary osteoarthritis of right knee 06/18/2023   Preoperative clearance 06/06/2023   Foot pain 03/21/2023   Neck pain 02/08/2023   Low testosterone 02/08/2023   Osteoarthritis of knee 11/29/2022   Right leg DVT (HCC) 04/26/2022   Chronic kidney disease, stage 3a (HCC) 04/26/2022   Essential hypertension 04/26/2022   Hypothyroidism 04/26/2022   AKI (acute kidney injury) (HCC) 04/26/2022   Type 2 diabetes mellitus (HCC) 04/26/2022   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   Cervical myelopathy (HCC) 01/22/2018   Seizures (HCC) 10/13/2013   Obesity 08/11/2013   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   History of anal cancer 04/12/2012   Cancer (HCC) 04/09/2012   Rectal mass 03/20/2012    History of colonic polyps 03/20/2012   Atrial fibrillation (HCC) 08/20/2011   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   Coronary artery disease involving native coronary artery of native heart without angina pectoris 08/07/2011   Aortic stenosis 07/31/2011   Elevated cholesterol    ED (erectile dysfunction)    Kidney stones     Past Medical History:  Diagnosis Date   Allergy    Anal cancer (HCC) 04/2012   Aortic stenosis    valve replacement   Arthritis    1995- cerv. 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   CHF (congestive heart failure) (HCC)    Coronary artery disease    aortic stenosis, CAD   Diabetes mellitus    Type 2   DVT (deep venous thrombosis) (HCC)    ED (erectile dysfunction)    Elevated cholesterol    Heart murmur    no problems   History of kidney stones    passed, lithrotrispy and surgery   History of radiation therapy 05/20/12-06/28/12   anal cancer=54gy total dose   Hypertension    Kidney stones    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   Malignant neoplasm of other sites of rectum, rectosigmoid junction, and anus 04/12/2012   Myocardial infarction [facility]) 2012   Personal history of colonic polyps 03/20/2012   Pulmonary embolism (HCC)    Rectal bleeding  Rectal mass 03/20/2012   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   #78mm [facility] Ease pericardial tissue valve    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   LIMA to diagonal branch    Seizure disorder, grand mal (HCC)    only 1   Seizures (HCC)    after taking Oxycodone; not prescribed seizure med   Shortness of breath    Thrombosed hemorrhoids     Past Surgical History:  Procedure Laterality Date   ANAL FISSURE REPAIR     patient unsure of date   ANTERIOR CERVICAL DECOMP/DISCECTOMY FUSION N/A  01/22/2018   Procedure: ANTERIOR CERVICAL DECOMPRESSION/DISCECTOMY FUSION CERVICAL SIX- CERVICAL SEVEN;  Surgeon: Maeola Harman, MD;  Location: [facility] OR;  Service: Neurosurgery;  Laterality: N/A;  ANTERIOR CERVICAL DECOMPRESSION/DISCECTOMY FUSION CERVICAL SIX- CERVICAL SEVEN   AORTIC VALVE REPLACEMENT  08/11/2011   Procedure: AORTIC VALVE REPLACEMENT (AVR);  Surgeon: Purcell Nails, MD;  Location: [facility] OR;  Service: Open Heart Surgery;  Laterality: N/A;   C4-6 FUSION  1995   DR STERN   COLONOSCOPY     hx polyps   CORONARY ARTERY BYPASS GRAFT  08/16/2011   Procedure: CORONARY ARTERY BYPASS GRAFTING (CABG);  Surgeon: Purcell Nails, MD;  Location: [facility] OR;  Service: Open Heart Surgery;  Laterality: N/A;  CABG times one using left internal mammary artery   LITHOTRIPSY  2001 AND 2002   DR PETERSON   POLYPECTOMY     colon   TOTAL KNEE ARTHROPLASTY Right 06/18/2023   Procedure: RIGHT TOTAL KNEE ARTHROPLASTY;  Surgeon: Ollen Gross, MD;  Location: [facility] ORS;  Service: Orthopedics;  Laterality: Right;    Prior to Admission medications   Medication Sig Start Date End Date Taking? Authorizing Provider  AMBULATORY NON FORMULARY MEDICATION 1 mL by Intracavernosal route once as needed for up to 1 dose. Medication Name: Trimix PAPAVERINE/PHENTOLAMINE/PROSTAGLANDIN E1 (5 mL vial) 30mg -2mg - Patient taking differently: 1 mL by Intracavernosal route once as needed (errection). Medication Name: Trimix PAPAVERINE/PHENTOLAMINE/PROSTAGLANDIN E1 (5 mL vial) 30mg -2mg - 04/02/23   [name], Buren Kos, MD  amitriptyline (ELAVIL) 25 MG tablet Take 6.25 mg by mouth at bedtime as needed for sleep.    [provider]  apixaban Everlene Balls) 2.5 MG TABS tablet Take 1 tablet by mouth twice daily 06/11/23   [name], MD  aspirin EC 81 MG tablet Take 81 mg by mouth daily.    [provider]  Cholecalciferol 1000 UNITS tablet Take 1,000 Units by mouth daily.    [provider]  DULoxetine  (CYMBALTA) 60 MG capsule Take 1 capsule (60 mg total) by mouth at bedtime. 06/14/23   [name], Buren Kos, MD  FARXIGA 5 MG TABS tablet Take 1 tablet (5 mg total) by mouth daily. 10/10/23   [name], Buren Kos, MD  ketoconazole (NIZORAL) 2 % shampoo Apply 1 Application topically 2 (two) times a week. 04/25/23   [provider]  levothyroxine (SYNTHROID) 25 MCG tablet Take 1 tablet (25 mcg total) by mouth daily. 10/10/23   [name], Raymond J, MD  losartan (COZAAR) 50 MG tablet Take 1 tablet by mouth once daily 07/31/23   [name], Buren Kos, MD  methocarbamol (ROBAXIN) 500 MG tablet Take 1 tablet (500 mg total) by mouth every 6 (six) hours as needed for muscle spasms. 06/19/23   Edmisten, Lyn Hollingshead, PA  metoprolol tartrate (LOPRESSOR) 25 MG tablet TAKE 1 TABLET BY MOUTH TWICE A DAY 03/20/23   [name], Buren Kos, MD  MYRBETRIQ 25 MG TB24 tablet Take 1 tablet (25 mg total) by mouth daily. 06/14/23   [name], Raymond J, MD  NEEDLE, DISP, 22 G 22G X 1-1/2" MISC 1 each by Does not apply route once a week. Use to administer medication. 02/28/23   [name], Buren Kos, MD  OZEMPIC, 0.25 OR 0.5 MG/DOSE, 2 MG/3ML SOPN INJECT 0.25 MG SUBCUTANEOUSLY ONCE A WEEK 08/27/23   [name], Buren Kos, MD  rosuvastatin (CRESTOR) 40 MG tablet TAKE 1 TABLET BY MOUTH AT BEDTIME 10/04/23   [name], Buren Kos, MD  Syringe/Needle, Disp, (SYRINGE 3CC/18GX1-1/2") 18G X 1-1/2" 3 ML MISC 1 each by Does not apply route once a week. Use to draw up medication. 02/28/23   [name], Buren Kos, MD  testosterone cypionate (DEPOTESTOTERONE CYPIONATE) 100 MG/ML injection INJECT 1/2 (ONE-HALF) ML (CC) INTRAMUSCULARLY ONCE A WEEK Patient taking differently: No sig reported 05/17/23   [name], Buren Kos, MD  UltiCare Alcohol Swabs 70 % PADS 1 each by Does not apply route once a week. 02/28/23   [name], Buren Kos, MD  Vitamins-Lipotropics (LIPO-FLAVONOID PLUS) TABS Take 1 tablet by mouth 3 (three) times daily after meals.    [provider]   Zinc Sulfate (ZINC-220 PO) Take 220 mg by mouth daily.    [provider]    Allergies  Allergen Reactions   Cat Dander Shortness Of Breath and Swelling   Hydrocodone Other (See Comments)   Atorvastatin Other (See Comments)    Pt has muscle ache on Atorvastatin but IS ABLE TO TAKE LIPITOR without any issues   Dust Mite Extract Other (See Comments) and Swelling   Metformin Other (See Comments)    Diarrhea, muscle cramps   Oxycodone Other (See Comments)    Seizure PATIENT DOES NOT WANT ANY NARCOTICS   Semaglutide(0.25 Or 0.5mg -Dos) Nausea Only    AT HIGHER DOSE    Social History   Socioeconomic History   Marital status: Married    Spouse name: [name]   Number of children: 2   Years of education: Not on file   Highest education level: Not on file  Occupational History   Occupation: Surveyor, minerals semi-retired  Tobacco Use   Smoking status: Never    Passive exposure: Never   Smokeless tobacco: Never  Vaping Use   Vaping status: Never Used  Substance and Sexual Activity   Alcohol use: Yes    Comment: rarely   Drug use: No   Sexual activity: Not on file  Other Topics Concern   Not on file  Social History Narrative   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, [name]   Self-employed painter   Social Drivers of Health   Financial Resource Strain: Low Risk  (02/06/2023)   Overall Financial Resource Strain (CARDIA)    Difficulty of Paying Living Expenses: Not hard at all  Food Insecurity: No Food Insecurity (06/18/2023)   Hunger Vital Sign    Worried About Running Out of Food in the Last Year: Never true    Ran Out of Food in the Last Year: Never true  Transportation Needs: No Transportation Needs (06/18/2023)   PRAPARE - Administrator, Civil Service (Medical): No    Lack of Transportation (Non-Medical): No  Physical Activity: Sufficiently Active (02/06/2023)   Exercise Vital Sign    Days of Exercise per Week: 7 days    Minutes of Exercise per Session: 30 min  Stress: No Stress  Concern Present (02/06/2023)   [employer] of Occupational Health -  Occupational Stress Questionnaire    Feeling of Stress : Not at all  Social Connections: Socially Integrated (02/06/2023)   Social Connection and Isolation Panel [NHANES]    Frequency of Communication with Friends and Family: More than three times a week    Frequency of Social Gatherings with Friends and Family: More than three times a week    Attends Religious Services: More than 4 times per year    Active Member of [employer] or Organizations: Yes    Attends Engineer, structural: More than 4 times per year    Marital Status: Married  Catering manager Violence: Not At Risk (06/18/2023)   Humiliation, Afraid, Rape, and Kick questionnaire    Fear of Current or Ex-Partner: No    Emotionally Abused: No    Physically Abused: No    Sexually Abused: No    Tobacco Use: Low Risk  (10/17/2023)   Received from [facility]   Patient History    Smoking Tobacco Use: Never    Smokeless Tobacco Use: Never    Passive Exposure: Never   Social History   Substance and Sexual Activity  Alcohol Use Yes   Comment: rarely    Family History  Problem Relation Age of Onset   Breast cancer Mother    Heart attack Father    Esophageal cancer Maternal Grandfather    Anesthesia problems Neg Hx    Hypotension Neg Hx    Malignant hyperthermia Neg Hx    Pseudochol deficiency Neg Hx    Colon cancer Neg Hx    Colon polyps Neg Hx    Stomach cancer Neg Hx    Rectal cancer Neg Hx     ROS  Objective:  Physical Exam: Well nourished and well developed.  General: Alert and oriented x3, cooperative and pleasant, no acute distress.  Head: normocephalic, atraumatic, neck supple.  Eyes: EOMI.  Abdomen: non-tender to palpation and soft, normoactive bowel sounds.  Musculoskeletal:  - Left knee No effusion, large Baker's cyst, range of motion 5-125 degrees with crepitus, tenderness medially, no lateral tenderness or instability.   Calves soft and nontender. Motor function intact in LE. Strength 5/5 LE bilaterally.  Neuro: Distal pulses 2+. Sensation to light touch intact in LE.   IMAGING:  - AP and lateral views of both knees demonstrate significant medial compartment bone-on-bone changes with varus deformity and patellofemoral arthritis, slightly worse on the right than the left.  Assessment/Plan:  End stage arthritis, left knee   The patient history, physical examination, clinical judgment of the provider and imaging studies are consistent with end stage degenerative joint disease of the left knee and total knee arthroplasty is deemed medically necessary. The treatment options including medical management, injection therapy arthroscopy and arthroplasty were discussed at length. The risks and benefits of total knee arthroplasty were presented and reviewed. The risks due to aseptic loosening, infection, stiffness, patella tracking problems, thromboembolic complications and other imponderables were discussed. The patient acknowledged the explanation, agreed to proceed with the plan and consent was signed. Patient is being admitted for inpatient treatment for surgery, pain control, PT, OT, prophylactic antibiotics, VTE prophylaxis, progressive ambulation and ADLs and discharge planning. The patient is planning to be discharged home.   Patient's anticipated LOS is less than 2 midnights, meeting these requirements: - Younger than 78 - Lives within 1 hour of care - Has a competent adult at home to recover with post-op recover - NO history of  - Chronic pain requiring opiods  - Coronary  Artery Disease  - Heart failure  - Heart attack  - Stroke  - Cardiac arrhythmia  - Respiratory Failure/COPD  - Renal failure  - Anemia  - Advanced Liver disease   Therapy Plans: EO [place] Disposition: Home with wife  Planned DVT Prophylaxis: Eliquis + 81mg  Aspirin (home regimen) DME Needed: None PCP: Raymond [name], MD  (clearance received) Cardiologist: Donato Schultz, MD (clearance in OV note from 08/30/23) TXA: TOPICAL Allergies: cat dander, dilaudid (confusion), dog dander, hydrocodone (seizure) Anesthesia Concerns: None BMI: 30 Last HgbA1c: 6.8% on 08/21/23 Pharmacy:  Other: -Hx AVR (2012) and DVT (2023) -OK to hold Eliquis 3 days prior to surgery per cardiology  -Stop Ozempic 1 week before surgery -Used tylenol and tramadol after R TKA. Will plan to send home with a few dilaudid tablets for severe pain  - Patient was instructed on what medications to stop prior to surgery. - Follow-up visit in 2 weeks with Dr. Lequita Halt - Begin physical therapy following surgery - Pre-operative lab work as pre-surgical testing - Prescriptions will be provided in hospital at time of discharge  Weston Brass, PA-C Orthopedic Surgery [facility] [place]

## 2023-10-25 ENCOUNTER — Other Ambulatory Visit (HOSPITAL_BASED_OUTPATIENT_CLINIC_OR_DEPARTMENT_OTHER): Payer: Self-pay | Admitting: Family Medicine

## 2023-10-25 DIAGNOSIS — I10 Essential (primary) hypertension: Secondary | ICD-10-CM

## 2023-10-25 NOTE — Patient Instructions (Signed)
SURGICAL WAITING ROOM VISITATION Patients having surgery or a procedure may have no more than 2 support people in the waiting area - these visitors may rotate in the visitor waiting room.   Due to an increase in RSV and influenza rates and associated hospitalizations, children ages 76 and under may not visit patients in [facility] hospitals. If the patient needs to stay at the hospital during part of their recovery, the visitor guidelines for inpatient rooms apply.  PRE-OP VISITATION  Pre-op nurse will coordinate an appropriate time for 1 support person to accompany the patient in pre-op.  This support person may not rotate.  This visitor will be contacted when the time is appropriate for the visitor to come back in the pre-op area.  Please refer to the [facility] website for the visitor guidelines for Inpatients (after your surgery is over and you are in a regular room).  You are not required to quarantine at this time prior to your surgery. However, you must do this: Hand Hygiene often Do NOT share personal items Notify your provider if you are in close contact with someone who has COVID or you develop fever 100.4 or greater, new onset of sneezing, cough, sore throat, shortness of breath or body aches.  If you test positive for Covid or have been in contact with anyone that has tested positive in the last 10 days please notify you surgeon.    Your procedure is scheduled on:  MONDAY  November 05, 2023  Report to [facility] Main Entrance: Leota [facility]sen entrance where the [employer] is available.   Report to admitting at: 05:45    AM  Call this number if you have any questions or problems the morning of surgery [phone]  Do not eat food after Midnight the night prior to your surgery/procedure.  After Midnight you may have the following liquids until  05:15 AM DAY OF SURGERY  Clear Liquid Diet Water Black Coffee (sugar ok, NO MILK/CREAM OR CREAMERS)  Tea (sugar ok, NO  MILK/CREAM OR CREAMERS) regular and decaf                             Plain Jell-O  with no fruit (NO RED)                                           Fruit ices (not with fruit pulp, NO RED)                                     Popsicles (NO RED)                                                                  Juice: NO CITRUS JUICES: only apple, WHITE grape, WHITE cranberry Sports drinks like Gatorade or Powerade (NO RED)                   The day of surgery:  Drink ONE (1) Pre-Surgery G2 at    05:15    AM the  morning of surgery. Drink in one sitting. Do not sip.  This drink was given to you during your hospital pre-op appointment visit. Nothing else to drink after completing the Pre-Surgery G2 : No candy, chewing gum or throat lozenges.    FOLLOW ANY ADDITIONAL PRE OP INSTRUCTIONS YOU RECEIVED FROM YOUR SURGEON'S OFFICE!!!   Oral Hygiene is also important to reduce your risk of infection.        Remember - BRUSH YOUR TEETH THE MORNING OF SURGERY WITH YOUR REGULAR TOOTHPASTE  Do NOT smoke after Midnight the night before surgery.  STOP TAKING all Vitamins, Herbs and supplements 1 week before your surgery.   Take ONLY these medicines the morning of surgery with A SIP OF WATER: Myrbetriq, levothyroxine, Metoprolol                    You may not have any metal on your body including  jewelry, and body piercing  Do not wear lotions, powders,  cologne, or deodorant  Men may shave face and neck.  Contacts, Hearing Aids, dentures or bridgework may not be worn into surgery. DENTURES WILL BE REMOVED PRIOR TO SURGERY PLEASE DO NOT APPLY "Poly grip" OR ADHESIVES!!!  You may bring a small overnight bag with you on the day of surgery, only pack items that are not valuable. [facility] IS NOT RESPONSIBLE   FOR VALUABLES THAT ARE LOST OR STOLEN.   Do not bring your home medications to the hospital. The Pharmacy will dispense medications listed on your medication list to you during your  admission in the Hospital.  Special Instructions: Bring a copy of your healthcare power of attorney and living will documents the day of surgery, if you wish to have them scanned into your [facility] Records- EPIC  Please read over the following fact sheets you were given: IF YOU HAVE QUESTIONS ABOUT YOUR PRE-OP INSTRUCTIONS, PLEASE CALL [phone].     Pre-operative 5 CHG Bath Instructions   You can play a key role in reducing the risk of infection after surgery. Your skin needs to be as free of germs as possible. You can reduce the number of germs on your skin by washing with CHG (chlorhexidine gluconate) soap before surgery. CHG is an antiseptic soap that kills germs and continues to kill germs even after washing.   DO NOT use if you have an allergy to chlorhexidine/CHG or antibacterial soaps. If your skin becomes reddened or irritated, stop using the CHG and notify one of our RNs at [phone]  Please shower with the CHG soap starting 4 days before surgery using the following schedule: START SHOWERS ON  THURSDAY  November 01, 2023  Please keep in mind the following:  DO NOT shave, including legs and underarms, starting the day of your first shower.   You may shave your face at any point before/day of surgery.   Place clean sheets on your bed the day you start using CHG soap. Use a clean washcloth (not used since being washed) for each shower. DO NOT sleep with pets once you start using the CHG.   CHG Shower Instructions:  If you choose to wash your hair and private area, wash first with your normal shampoo/soap.  After you use shampoo/soap, rinse your hair and body thoroughly to remove shampoo/soap residue.  Turn the water OFF and apply about 3 tablespoons (45 ml) of CHG soap to a CLEAN washcloth.   Apply CHG soap ONLY FROM YOUR NECK DOWN TO YOUR TOES (washing for 3-5 minutes)  DO NOT use CHG soap on face, private areas, open wounds, or sores.  Pay special attention to the area where your surgery is being performed.  If you are having back surgery, having someone wash your back for you may be helpful.  Wait 2 minutes after CHG soap is applied, then you may rinse off the CHG soap.  Pat dry with a clean towel  Put on clean clothes/pajamas   If you choose to wear lotion, please use ONLY the CHG-compatible lotions on the back of this paper.     Additional instructions for the day of surgery: DO NOT APPLY any lotions, deodorants, cologne, or perfumes.   Put on clean/comfortable clothes.  Brush your teeth.  Ask your nurse before applying any prescription medications to the skin.      CHG Compatible Lotions   Aveeno Moisturizing lotion  Cetaphil Moisturizing Cream  Cetaphil Moisturizing Lotion  Clairol Herbal Essence Moisturizing Lotion, Dry Skin  Clairol Herbal Essence Moisturizing Lotion, Extra Dry Skin  Clairol Herbal Essence Moisturizing Lotion, Normal Skin  Curel Age Defying Therapeutic Moisturizing Lotion with Alpha Hydroxy  Curel Extreme Care Body Lotion  Curel Soothing Hands Moisturizing Hand Lotion  Curel Therapeutic Moisturizing Cream, Fragrance-Free  Curel Therapeutic Moisturizing Lotion, Fragrance-Free  Curel Therapeutic Moisturizing Lotion, Original Formula  Eucerin Daily Replenishing Lotion  Eucerin Dry Skin Therapy Plus Alpha Hydroxy Crme  Eucerin Dry Skin Therapy Plus Alpha Hydroxy Lotion  Eucerin Original Crme  Eucerin Original Lotion  Eucerin Plus Crme Eucerin Plus Lotion  Eucerin TriLipid Replenishing Lotion  Keri Anti-Bacterial Hand Lotion  Keri Deep Conditioning Original Lotion Dry Skin Formula Softly Scented  Keri Deep Conditioning Original Lotion, Fragrance Free Sensitive Skin Formula  Keri Lotion Fast Absorbing Fragrance Free Sensitive Skin  Formula  Keri Lotion Fast Absorbing Softly Scented Dry Skin Formula  Keri Original Lotion  Keri Skin Renewal Lotion Keri Silky Smooth Lotion  Keri Silky Smooth Sensitive Skin Lotion  Nivea Body Creamy Conditioning Oil  Nivea Body Extra Enriched Lotion  Nivea Body Original Lotion  Nivea Body Sheer Moisturizing Lotion Nivea Crme  Nivea Skin Firming Lotion  NutraDerm 30 Skin Lotion  NutraDerm Skin Lotion  NutraDerm Therapeutic Skin Cream  NutraDerm Therapeutic Skin Lotion  ProShield Protective Hand Cream  Provon moisturizing lotion   FAILURE TO FOLLOW THESE INSTRUCTIONS MAY RESULT IN THE CANCELLATION OF YOUR SURGERY  PATIENT SIGNATURE_________________________________  NURSE SIGNATURE__________________________________  ________________________________________________________________________         Kirk [name]    An incentive spirometer is a tool that can help keep your lungs clear and active. This tool measures how well you are filling your lungs with each  breath. Taking long deep breaths may help reverse or decrease the chance of developing breathing (pulmonary) problems (especially infection) following: A long period of time when you are unable to move or be active. BEFORE THE PROCEDURE  If the spirometer includes an indicator to show your best effort, your nurse or respiratory therapist will set it to a desired goal. If possible, sit up straight or lean slightly forward. Try not to slouch. Hold the incentive spirometer in an upright position. INSTRUCTIONS FOR USE  Sit on the edge of your bed if possible, or sit up as far as you can in bed or on a chair. Hold the incentive spirometer in an upright position. Breathe out normally. Place the mouthpiece in your mouth and seal your lips tightly around it. Breathe in slowly and as deeply as possible, raising the piston or the ball toward the top of the column. Hold your breath for 3-5 seconds or for as long as  possible. Allow the piston or ball to fall to the bottom of the column. Remove the mouthpiece from your mouth and breathe out normally. Rest for a few seconds and repeat Steps 1 through 7 at least 10 times every 1-2 hours when you are awake. Take your time and take a few normal breaths between deep breaths. The spirometer may include an indicator to show your best effort. Use the indicator as a goal to work toward during each repetition. After each set of 10 deep breaths, practice coughing to be sure your lungs are clear. If you have an incision (the cut made at the time of surgery), support your incision when coughing by placing a pillow or rolled up towels firmly against it. Once you are able to get out of bed, walk around indoors and cough well. You may stop using the incentive spirometer when instructed by your caregiver.  RISKS AND COMPLICATIONS Take your time so you do not get dizzy or light-headed. If you are in pain, you may need to take or ask for pain medication before doing incentive spirometry. It is harder to take a deep breath if you are having pain. AFTER USE Rest and breathe slowly and easily. It can be helpful to keep track of a log of your progress. Your caregiver can provide you with a simple table to help with this. If you are using the spirometer at home, follow these instructions: SEEK MEDICAL CARE IF:  You are having difficultly using the spirometer. You have trouble using the spirometer as often as instructed. Your pain medication is not giving enough relief while using the spirometer. You develop fever of 100.5 F (38.1 C) or higher.                                                                                                    SEEK IMMEDIATE MEDICAL CARE IF:  You cough up bloody sputum that had not been present before. You develop fever of 102 F (38.9 C) or greater. You develop worsening pain at or near the incision site. MAKE SURE YOU:  Understand these  instructions. Will watch  your condition. Will get help right away if you are not doing well or get worse. Document Released: 01/01/2007 Document Revised: 11/13/2011 Document Reviewed: 03/04/2007 [facility] Patient Information 2014 [place], [place].        If you would like to see a video about joint replacement:   [email or web address]

## 2023-10-25 NOTE — Progress Notes (Signed)
COVID Vaccine received:  []  No [x]  Yes Date of any COVID positive Test in last 90 days: Yes, COVID on 09-04-23   PCP - Raymond [name], MD cleared in 10-10-23 Epic note Cardiologist - Donato Schultz, MD cleared in 08-30-23 Epic note  Chest x-ray - 07-21-2023  2v CEW EKG - 08/30/2023  Epic  Stress Test -  ECHO - 09-29-2023  Epic Cardiac Cath - 07-2011  R/LHC by Dr. Anne Fu  PCR screen: []  Ordered & Completed []   No Order but Needs PROFEND     []   N/A for this surgery  Surgery Plan:  []  Ambulatory   [x]  Outpatient in bed  []  Admit Anesthesia:    []  General  []  Spinal  [x]   Choice []   MAC  Pacemaker / ICD device [x]  No []  Yes   Spinal Cord Stimulator:[x]  No []  Yes       History of Sleep Apnea? [x]  No []  Yes   CPAP used?- [x]  No []  Yes    Does the patient monitor blood sugar?   []  N/A   [x]  No []  Yes  Patient has: []  NO Hx DM   []  Pre-DM   []  DM1  [x]   DM2 Last A1c was: 6.8  on  08-21-2023    Does patient have a [employer] or Dexcom? [x]  No []  Yes   Fasting Blood Sugar Ranges-  Checks Blood Sugar 0 times a day  GLP1 agonist / usual dose - Ozempic  on Sunday. Last dose: 10-28-2023  SGLT-2 inhibitors / usual dose - Farxiga hold x 72 hours.  Last dose: 10-22-2023  Blood Thinner / Instructions: Eliquis  Hold x 3 days  Aspirin Instructions:  ASA 81 mg  Hold x 5 days  Patient is aware.  ERAS Protocol Ordered: []  No  [x]  Yes Patient is to be NPO after: 05:15  Dental hx: []  Dentures:  []  N/A      [x]  Bridge or Partial: permanent bridge                  []  Loose or Damaged teeth:   Comments: Patient was given the 5 CHG shower / bath instructions for TKA surgery along with 2 bottles of the CHG soap. Patient will start this on: 11-01-2023 All questions were asked and answered, Patient voiced understanding of this process.   Activity level:   Can go up a flight of stairs and perform activities of daily living without stopping and without symptoms of chest pain or shortness of breath.   Some  limitations due to knee pain   Anesthesia review: A.fib, CAD, HTN, DM2, CKD3, s/p CABG/AVR in 2012, CHF, Hx saddle PE & DVT, Seizure after taking Oxycodone,   Patient denies shortness of breath, fever, cough and chest pain at PAT appointment.  Patient verbalized understanding and agreement to the Pre-Surgical Instructions that were given to them at this PAT appointment. Patient was also educated of the need to review these PAT instructions again prior to his surgery.I reviewed the appropriate phone numbers to call if they have any and questions or concerns.

## 2023-10-26 ENCOUNTER — Encounter (HOSPITAL_COMMUNITY)
Admission: RE | Admit: 2023-10-26 | Discharge: 2023-10-26 | Disposition: A | Discharge status: Home / Self Care | Payer: Medicare Other | Source: Ambulatory Visit | Attending: Orthopedic Surgery | Admitting: Orthopedic Surgery

## 2023-10-26 ENCOUNTER — Encounter (HOSPITAL_COMMUNITY): Payer: Self-pay

## 2023-10-26 ENCOUNTER — Other Ambulatory Visit: Payer: Self-pay

## 2023-10-26 VITALS — BP 114/58 | HR 54 | Temp 98.0°F | Resp 12 | Ht 71.0 in | Wt 221.0 lb

## 2023-10-26 DIAGNOSIS — E039 Hypothyroidism, unspecified: Secondary | ICD-10-CM | POA: Diagnosis not present

## 2023-10-26 DIAGNOSIS — I4891 Unspecified atrial fibrillation: Secondary | ICD-10-CM | POA: Insufficient documentation

## 2023-10-26 DIAGNOSIS — E1142 Type 2 diabetes mellitus with diabetic polyneuropathy: Secondary | ICD-10-CM | POA: Insufficient documentation

## 2023-10-26 DIAGNOSIS — Z86711 Personal history of pulmonary embolism: Secondary | ICD-10-CM | POA: Diagnosis not present

## 2023-10-26 DIAGNOSIS — Z951 Presence of aortocoronary bypass graft: Secondary | ICD-10-CM | POA: Insufficient documentation

## 2023-10-26 DIAGNOSIS — M1990 Unspecified osteoarthritis, unspecified site: Secondary | ICD-10-CM | POA: Insufficient documentation

## 2023-10-26 DIAGNOSIS — I10 Essential (primary) hypertension: Secondary | ICD-10-CM

## 2023-10-26 DIAGNOSIS — I252 Old myocardial infarction: Secondary | ICD-10-CM | POA: Diagnosis not present

## 2023-10-26 DIAGNOSIS — I110 Hypertensive heart disease with heart failure: Secondary | ICD-10-CM | POA: Insufficient documentation

## 2023-10-26 DIAGNOSIS — Z7901 Long term (current) use of anticoagulants: Secondary | ICD-10-CM | POA: Insufficient documentation

## 2023-10-26 DIAGNOSIS — Z86718 Personal history of other venous thrombosis and embolism: Secondary | ICD-10-CM | POA: Diagnosis not present

## 2023-10-26 DIAGNOSIS — Z01812 Encounter for preprocedural laboratory examination: Secondary | ICD-10-CM | POA: Insufficient documentation

## 2023-10-26 DIAGNOSIS — I2510 Atherosclerotic heart disease of native coronary artery without angina pectoris: Secondary | ICD-10-CM | POA: Diagnosis not present

## 2023-10-26 DIAGNOSIS — I509 Heart failure, unspecified: Secondary | ICD-10-CM | POA: Insufficient documentation

## 2023-10-26 DIAGNOSIS — M1712 Unilateral primary osteoarthritis, left knee: Secondary | ICD-10-CM | POA: Diagnosis not present

## 2023-10-26 DIAGNOSIS — Z01818 Encounter for other preprocedural examination: Secondary | ICD-10-CM

## 2023-10-26 HISTORY — DX: Hypothyroidism, unspecified: E03.9

## 2023-10-26 HISTORY — DX: Pneumonia, unspecified organism: J18.9

## 2023-10-26 LAB — CBC
HCT: 50 % (ref 39.0–52.0)
Hemoglobin: 15.5 g/dL (ref 13.0–17.0)
MCH: 30.5 pg (ref 26.0–34.0)
MCHC: 31 g/dL (ref 30.0–36.0)
MCV: 98.2 fL (ref 80.0–100.0)
Platelets: 216 10*3/uL (ref 150–400)
RBC: 5.09 MIL/uL (ref 4.22–5.81)
RDW: 14.1 % (ref 11.5–15.5)
WBC: 6.6 10*3/uL (ref 4.0–10.5)
nRBC: 0 % (ref 0.0–0.2)

## 2023-10-26 LAB — BASIC METABOLIC PANEL
Anion gap: 9 (ref 5–15)
BUN: 16 mg/dL (ref 8–23)
CO2: 27 mmol/L (ref 22–32)
Calcium: 9.7 mg/dL (ref 8.9–10.3)
Chloride: 101 mmol/L (ref 98–111)
Creatinine, Ser: 1.08 mg/dL (ref 0.61–1.24)
GFR, Estimated: >60 mL/min (ref >60)
Glucose, Bld: 112 mg/dL — ABNORMAL HIGH (ref 70–99)
Potassium: 5.1 mmol/L (ref 3.5–5.1)
Sodium: 137 mmol/L (ref 135–145)

## 2023-10-26 LAB — HEMOGLOBIN A1C
Hgb A1c MFr Bld: 6.5 % — ABNORMAL HIGH (ref 4.8–5.6)
Mean Plasma Glucose: 139.85 mg/dL

## 2023-10-26 LAB — SURGICAL PCR SCREEN
MRSA, PCR: NEGATIVE
Staphylococcus aureus: NEGATIVE

## 2023-10-26 LAB — GLUCOSE, CAPILLARY: Glucose-Capillary: 126 mg/dL — ABNORMAL HIGH (ref 70–99)

## 2023-10-29 NOTE — Progress Notes (Signed)
 Case: [id number] Date/Time: 11/05/23 0805   Procedure: TOTAL KNEE ARTHROPLASTY (Left: Knee)   Anesthesia type: Choice   Pre-op diagnosis: left knee osteoarthritis   [facility]: [facility] ROOM 10 / [facility]   Surgeons: Ollen Gross, MD       DISCUSSION: Kirk [name] is a 78 yo male who presents to PAT prior to surgery above. PMH of CAD s/p CABG (2012), hx of MI, AS s/p AVR (2012), CHF, post-op A.fib, hx of DVT/PE on Eliquis, DM, hypothyroidism, rectal cancer s/p chemo and XRT, arthritis, hx of cervical fusion (01/2018). High STOP-BANG score of 5  Patient underwent right TKA on 06/18/2023 which was uncomplicated.  Patient follows with Cardiology fox hx of CAD s/p CABG, AS s/p AVR. Additionally he was diagnosed with a DVT and saddle PE in 04/2022. Lifelong anticoagulation was recommended and he is taking low dose Eliquis. Last seen in clinic by Dr. Anne Fu on 08/30/2023. Was doing overall well at that time. Recovering well after surgery.  Doing well on GDMT. Echo was repeated on 10/01/23 and showed normal EF 60-65%, grade 2 DD, mild LVH, trivial MR, prosthetic aortic valve with mean gradient . Cleared for surgery:  "Preoperative Evaluation for Knee Replacement 78 year old with aortic valve replacement, here for preoperative evaluation for knee replacement surgery on November 05, 2022. No recent difficulties. Compliant with Eliquis therapy. Discussed holding Eliquis for three days prior to surgery and resuming postoperatively. Informed about spinal anesthetic and postoperative rehabilitation. - Proceed with knee surgery on November 05, 2022 - Hold Eliquis for three days prior to surgery - Resume Eliquis postoperatively"  LD Eliquis:  Hold x 3 days  GLP1 agonist / usual dose - Ozempic  on Sunday. Last dose: 10-28-2023  SGLT-2 inhibitors / usual dose - Farxiga hold x 72 hours.  Last dose: 10-22-2023  VS: BP (!) 114/58 Comment: right arm sitting  Pulse (!) 54   Temp 36.7 C (Oral)   Resp 12   Ht 5\' 11"  (1.803  m)   Wt 100.2 kg   SpO2 96%   BMI 30.82 kg/m   PROVIDERS: [name], Buren Kos, MD Cardiologist: Donato Schultz, MD  LABS: Labs reviewed: Acceptable for surgery. (all labs ordered are listed, but only abnormal results are displayed)  Labs Reviewed  HEMOGLOBIN A1C - Abnormal; Notable for the following components:      Result Value   Hgb A1c MFr Bld 6.5 (*)    All other components within normal limits  BASIC METABOLIC PANEL - Abnormal; Notable for the following components:   Glucose, Bld 112 (*)    All other components within normal limits  GLUCOSE, CAPILLARY - Abnormal; Notable for the following components:   Glucose-Capillary 126 (*)    All other components within normal limits  SURGICAL PCR SCREEN  CBC     IMAGES:  CTA Chest 04/25/2022:   IMPRESSION: CHEST CTA:   1. Acute saddle pulmonary embolus of the main pulmonary artery, left and right pulmonary arteries, and bilateral lobar arteries. 2. Calcifications are seen in the bilateral distal right and left pulmonary arteries, likely post thrombotic change related to prior pulmonary embolus. 3. Elevated RV to LV ratio, findings can be seen in the setting of right heart strain. Correlate with echocardiography. 4. Branching, tubular high attenuation structure of the left lower lobe, compatible with a bronchocele, finding was present on 2014 exam, but has increased in size.   ABDOMEN AND PELVIS CT:   1. Nonocclusive filling defect of the right common femoral vein, compatible  with history of DVT. 2. No evidence of metastatic disease in the abdomen or pelvis. 3. Aortic Atherosclerosis (ICD10-I70.0).   EKG 12/216/24:  Sinus rhythm with 1st degree A-V block, rate 67 Nonspecific ST and T wave abnormality Poor R wave progression  CV:  Echo 10/01/2023:  IMPRESSIONS    1. Left ventricular ejection fraction, by estimation, is 60 to 65%. The left ventricle has normal function. The left ventricle has no regional wall  motion abnormalities. There is mild concentric left ventricular hypertrophy. Left ventricular diastolic parameters are consistent with Grade II diastolic dysfunction (pseudonormalization).  2. Right ventricular systolic function is normal. The right ventricular size is normal. There is normal pulmonary artery systolic pressure. The estimated right ventricular systolic pressure is 20.8 mmHg.  3. Left atrial size was mildly dilated.  4. The mitral valve is degenerative. Trivial mitral valve regurgitation. No evidence of mitral stenosis. Moderate mitral annular calcification.  5. Bioprosthetic aortic valve, leaflets not well-visualized. Mean gradient 21 mmHg, dimensionless index 0.3. The gradient across the valve is elevated, mildly higher than prior (16 mmHg). No peri-valvular leakage.  6. The inferior vena cava is normal in size with greater than 50% respiratory variability, suggesting right atrial pressure of 3 mmHg.  Past Medical History:  Diagnosis Date   Allergy    Anal cancer (HCC) 04/2012   Aortic stenosis    valve replacement   Arthritis    1995- cerv. 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   CHF (congestive heart failure) (HCC)    Coronary artery disease    aortic stenosis, CAD   Diabetes mellitus    Type 2   DVT (deep venous thrombosis) (HCC)    ED (erectile dysfunction)    Elevated cholesterol    Heart murmur    no problems   History of kidney stones    passed, lithrotrispy and surgery   History of radiation therapy 05/20/12-06/28/12   anal cancer=54gy total dose   Hypertension    Hypothyroidism    Kidney stones    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   Malignant neoplasm of other sites of rectum, rectosigmoid junction, and anus 04/12/2012   Myocardial infarction (HCC) 2012   Personal history of  colonic polyps 03/20/2012   Pneumonia    Pulmonary embolism (HCC)    Rectal bleeding    Rectal mass 03/20/2012   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   #67mm [facility] pericardial tissue valve    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   LIMA to diagonal branch    Seizure disorder, grand mal (HCC)    only 1   Seizures (HCC)    after taking Oxycodone; not prescribed seizure med   Shortness of breath    Thrombosed hemorrhoids     Past Surgical History:  Procedure Laterality Date   ANAL FISSURE REPAIR     patient unsure of date   ANTERIOR CERVICAL DECOMP/DISCECTOMY FUSION N/A 01/22/2018   Procedure: ANTERIOR CERVICAL DECOMPRESSION/DISCECTOMY FUSION CERVICAL SIX- CERVICAL SEVEN;  Surgeon: Maeola Harman, MD;  Location: [facility] OR;  Service: Neurosurgery;  Laterality: N/A;  ANTERIOR CERVICAL DECOMPRESSION/DISCECTOMY FUSION CERVICAL SIX- CERVICAL SEVEN   AORTIC VALVE REPLACEMENT  08/11/2011   Procedure: AORTIC VALVE REPLACEMENT (AVR);  Surgeon: Purcell Nails, MD;  Location: [facility] OR;  Service: Open Heart Surgery;  Laterality: N/A;   C4-6 FUSION  1995   DR STERN   COLONOSCOPY     hx polyps   CORONARY ARTERY BYPASS GRAFT  08/16/2011   Procedure: CORONARY ARTERY BYPASS GRAFTING (CABG);  Surgeon: Purcell Nails, MD;  Location: [facility] OR;  Service: Open Heart Surgery;  Laterality: N/A;  CABG times one using left internal mammary artery   LITHOTRIPSY  2001 AND 2002   DR PETERSON   POLYPECTOMY     colon   TOTAL KNEE ARTHROPLASTY Right 06/18/2023   Procedure: RIGHT TOTAL KNEE ARTHROPLASTY;  Surgeon: Ollen Gross, MD;  Location[facility];  Service: Orthopedics;  Laterality: Right;    MEDICATIONS:  AMBULATORY NON FORMULARY MEDICATION   apixaban (ELIQUIS) 2.5 MG TABS tablet   aspirin EC 81 MG tablet   DULoxetine (CYMBALTA) 60 MG capsule   FARXIGA 5 MG TABS tablet   levothyroxine (SYNTHROID) 25 MCG tablet   losartan (COZAAR) 50 MG tablet   metoprolol tartrate (LOPRESSOR) 25 MG tablet   MYRBETRIQ  25 MG TB24 tablet   NEEDLE, DISP, 22 G 22G X 1-1/2" MISC   OZEMPIC, 0.25 OR 0.5 MG/DOSE, 2 MG/3ML SOPN   rosuvastatin (CRESTOR) 40 MG tablet   Vitamins-Lipotropics (LIPO-FLAVONOID PLUS) TABS   No current facility-administered medications for this encounter.   Marcille Blanco [facility] Surgical Short Stay/Anesthesiology [facility] Phone [phone] 10/29/2023 9:44 AM

## 2023-10-29 NOTE — Anesthesia Preprocedure Evaluation (Addendum)
 Anesthesia Evaluation    Reviewed: Allergy & Precautions, Patient's Chart, lab work & pertinent test results, reviewed documented beta blocker date and time   Airway Mallampati: II  TM Distance: >3 FB Neck ROM: Full    Dental no notable dental hx. (+) Teeth Intact, Dental Advisory Given   Pulmonary PE   Pulmonary exam normal breath sounds clear to auscultation       Cardiovascular hypertension, Pt. on home beta blockers and Pt. on medications + CAD, + Past MI, + CABG, +CHF and + DVT  Normal cardiovascular exam+ dysrhythmias + Valvular Problems/Murmurs (s/p AVR) AS  Rhythm:Regular Rate:Normal  Echo 10/01/2023:   IMPRESSIONS      1. Left ventricular ejection fraction, by estimation, is 60 to 65%. The left ventricle has normal function. The left ventricle has no regional wall motion abnormalities. There is mild concentric left ventricular hypertrophy. Left ventricular diastolic parameters are consistent with Grade II diastolic dysfunction (pseudonormalization).  2. Right ventricular systolic function is normal. The right ventricular size is normal. There is normal pulmonary artery systolic pressure. The estimated right ventricular systolic pressure is 20.8 mmHg.  3. Left atrial size was mildly dilated.  4. The mitral valve is degenerative. Trivial mitral valve regurgitation. No evidence of mitral stenosis. Moderate mitral annular calcification.  5. Bioprosthetic aortic valve, leaflets not well-visualized. Mean gradient 21 mmHg, dimensionless index 0.3. The gradient across the valve is elevated, mildly higher than prior (16 mmHg). No peri-valvular leakage.  6. The inferior vena cava is normal in size with greater than 50% respiratory variability, suggesting right atrial pressure of 3 mmHg.    Neuro/Psych Seizures -,   negative psych ROS   GI/Hepatic negative GI ROS, Neg liver ROS,,,  Endo/Other  diabetes, Type 2Hypothyroidism     Renal/GU negative Renal ROS  negative genitourinary   Musculoskeletal  (+) Arthritis ,    Abdominal   Peds  Hematology negative hematology ROS (+)   Anesthesia Other Findings Eliquis LD 11/01/23  Reproductive/Obstetrics                             Anesthesia Physical Anesthesia Plan  ASA: 3  Anesthesia Plan: Spinal and Regional   Post-op Pain Management: Regional block* and Ofirmev IV (intra-op)*   Induction:   PONV Risk Score and Plan: Treatment may vary due to age or medical condition, Propofol infusion, Ondansetron and Dexamethasone  Airway Management Planned: Natural Airway  Additional Equipment:   Intra-op Plan:   Post-operative Plan:   Informed Consent: I have reviewed the patients History and Physical, chart, labs and discussed the procedure including the risks, benefits and alternatives for the proposed anesthesia with the patient or authorized representative who has indicated his/her understanding and acceptance.     Dental advisory given  Plan Discussed with: CRNA  Anesthesia Plan Comments: (See PAT note from 2/21 by Sherlie Ban PA-C )        Anesthesia Quick Evaluation

## 2023-11-05 ENCOUNTER — Other Ambulatory Visit: Payer: Self-pay

## 2023-11-05 ENCOUNTER — Ambulatory Visit (HOSPITAL_COMMUNITY): Payer: Self-pay | Admitting: Anesthesiology

## 2023-11-05 ENCOUNTER — Observation Stay (HOSPITAL_COMMUNITY)
Admission: RE | Admit: 2023-11-05 | Discharge: 2023-11-06 | Disposition: A | Discharge status: Home / Self Care | Payer: Medicare Other | Attending: Orthopedic Surgery | Admitting: Orthopedic Surgery

## 2023-11-05 ENCOUNTER — Ambulatory Visit (HOSPITAL_COMMUNITY): Payer: Self-pay | Admitting: Medical

## 2023-11-05 ENCOUNTER — Encounter (HOSPITAL_COMMUNITY): Payer: Self-pay | Admitting: Orthopedic Surgery

## 2023-11-05 ENCOUNTER — Encounter (HOSPITAL_COMMUNITY)
Admission: RE | Disposition: A | Discharge status: Home / Self Care | Payer: Self-pay | Source: Home / Self Care | Attending: Orthopedic Surgery

## 2023-11-05 DIAGNOSIS — E119 Type 2 diabetes mellitus without complications: Secondary | ICD-10-CM | POA: Diagnosis not present

## 2023-11-05 DIAGNOSIS — Z01818 Encounter for other preprocedural examination: Secondary | ICD-10-CM

## 2023-11-05 DIAGNOSIS — Z86718 Personal history of other venous thrombosis and embolism: Secondary | ICD-10-CM | POA: Insufficient documentation

## 2023-11-05 DIAGNOSIS — E1122 Type 2 diabetes mellitus with diabetic chronic kidney disease: Secondary | ICD-10-CM | POA: Diagnosis not present

## 2023-11-05 DIAGNOSIS — I4891 Unspecified atrial fibrillation: Secondary | ICD-10-CM | POA: Insufficient documentation

## 2023-11-05 DIAGNOSIS — M179 Osteoarthritis of knee, unspecified: Principal | ICD-10-CM | POA: Diagnosis present

## 2023-11-05 DIAGNOSIS — Z7901 Long term (current) use of anticoagulants: Secondary | ICD-10-CM | POA: Insufficient documentation

## 2023-11-05 DIAGNOSIS — E1142 Type 2 diabetes mellitus with diabetic polyneuropathy: Secondary | ICD-10-CM

## 2023-11-05 DIAGNOSIS — M1712 Unilateral primary osteoarthritis, left knee: Secondary | ICD-10-CM

## 2023-11-05 DIAGNOSIS — Z79899 Other long term (current) drug therapy: Secondary | ICD-10-CM | POA: Diagnosis not present

## 2023-11-05 DIAGNOSIS — G8918 Other acute postprocedural pain: Secondary | ICD-10-CM | POA: Diagnosis not present

## 2023-11-05 DIAGNOSIS — I2510 Atherosclerotic heart disease of native coronary artery without angina pectoris: Secondary | ICD-10-CM | POA: Diagnosis not present

## 2023-11-05 DIAGNOSIS — E039 Hypothyroidism, unspecified: Secondary | ICD-10-CM

## 2023-11-05 DIAGNOSIS — Z951 Presence of aortocoronary bypass graft: Secondary | ICD-10-CM | POA: Insufficient documentation

## 2023-11-05 DIAGNOSIS — Z96651 Presence of right artificial knee joint: Secondary | ICD-10-CM | POA: Diagnosis not present

## 2023-11-05 DIAGNOSIS — I129 Hypertensive chronic kidney disease with stage 1 through stage 4 chronic kidney disease, or unspecified chronic kidney disease: Secondary | ICD-10-CM | POA: Diagnosis not present

## 2023-11-05 DIAGNOSIS — Z86711 Personal history of pulmonary embolism: Secondary | ICD-10-CM | POA: Diagnosis not present

## 2023-11-05 DIAGNOSIS — Z85048 Personal history of other malignant neoplasm of rectum, rectosigmoid junction, and anus: Secondary | ICD-10-CM | POA: Insufficient documentation

## 2023-11-05 DIAGNOSIS — N1831 Chronic kidney disease, stage 3a: Secondary | ICD-10-CM | POA: Insufficient documentation

## 2023-11-05 HISTORY — PX: TOTAL KNEE ARTHROPLASTY: SHX125

## 2023-11-05 LAB — GLUCOSE, CAPILLARY
Glucose-Capillary: 105 mg/dL — ABNORMAL HIGH (ref 70–99)
Glucose-Capillary: 96 mg/dL (ref 70–99)
Glucose-Capillary: 149 mg/dL — ABNORMAL HIGH (ref 70–99)
Glucose-Capillary: 150 mg/dL — ABNORMAL HIGH (ref 70–99)

## 2023-11-05 SURGERY — ARTHROPLASTY, KNEE, TOTAL
Anesthesia: Regional | Site: Knee | Laterality: Left

## 2023-11-05 MED ORDER — PHENYLEPHRINE HCL-NACL 20-0.9 MG/250ML-% IV SOLN
INTRAVENOUS | Status: DC | PRN
  Administered 2023-11-05: 40 ug/min via INTRAVENOUS

## 2023-11-05 MED ORDER — INSULIN ASPART 100 UNIT/ML IJ SOLN: 0.0000 [IU] | INTRAMUSCULAR | Status: DC | PRN

## 2023-11-05 MED ORDER — INSULIN ASPART 100 UNIT/ML IJ SOLN
0.0000 [IU] | Freq: Three times a day (TID) | INTRAMUSCULAR | Status: DC
  Administered 2023-11-05 – 2023-11-06 (×3): 2 [IU] via SUBCUTANEOUS

## 2023-11-05 MED ORDER — SODIUM CHLORIDE (PF) 0.9 % IJ SOLN
INTRAMUSCULAR | Status: AC
Start: 2023-11-05 — End: ?
  Filled 2023-11-05: qty 10

## 2023-11-05 MED ORDER — METOCLOPRAMIDE HCL 5 MG PO TABS: 5.0000 mg | ORAL_TABLET | Freq: Three times a day (TID) | ORAL | Status: DC | PRN

## 2023-11-05 MED ORDER — 0.9 % SODIUM CHLORIDE (POUR BTL) OPTIME
TOPICAL | Status: DC | PRN
  Administered 2023-11-05: 1000 mL

## 2023-11-05 MED ORDER — DAPAGLIFLOZIN PROPANEDIOL 5 MG PO TABS
5.0000 mg | ORAL_TABLET | Freq: Every day | ORAL | Status: DC
  Administered 2023-11-06: 5 mg via ORAL
  Filled 2023-11-05: qty 1

## 2023-11-05 MED ORDER — SODIUM CHLORIDE (PF) 0.9 % IJ SOLN
INTRAMUSCULAR | Status: AC
  Filled 2023-11-05: qty 50

## 2023-11-05 MED ORDER — PROPOFOL 10 MG/ML IV BOLUS
INTRAVENOUS | Status: DC | PRN
  Administered 2023-11-05: 50 mg via INTRAVENOUS
  Administered 2023-11-05: 20 mg via INTRAVENOUS

## 2023-11-05 MED ORDER — ACETAMINOPHEN 325 MG PO TABS
325.0000 mg | ORAL_TABLET | Freq: Four times a day (QID) | ORAL | Status: DC | PRN
  Administered 2023-11-06: 650 mg via ORAL
  Filled 2023-11-05: qty 2

## 2023-11-05 MED ORDER — ONDANSETRON HCL 4 MG/2ML IJ SOLN
INTRAMUSCULAR | Status: DC | PRN
  Administered 2023-11-05: 4 mg via INTRAVENOUS

## 2023-11-05 MED ORDER — DEXAMETHASONE SODIUM PHOSPHATE 10 MG/ML IJ SOLN
INTRAMUSCULAR | Status: DC | PRN
  Administered 2023-11-05: 10 mg

## 2023-11-05 MED ORDER — CEFAZOLIN SODIUM-DEXTROSE 2-4 GM/100ML-% IV SOLN
2.0000 g | INTRAVENOUS | Status: AC
  Administered 2023-11-05: 2 g via INTRAVENOUS
  Filled 2023-11-05: qty 100

## 2023-11-05 MED ORDER — FLEET ENEMA RE ENEM: 1.0000 | ENEMA | Freq: Once | RECTAL | Status: DC | PRN

## 2023-11-05 MED ORDER — BUPIVACAINE IN DEXTROSE 0.75-8.25 % IT SOLN
INTRATHECAL | Status: DC | PRN
Start: 2023-11-05 — End: 2023-11-05
  Administered 2023-11-05: 1.6 mL via INTRATHECAL

## 2023-11-05 MED ORDER — ROSUVASTATIN CALCIUM 20 MG PO TABS
40.0000 mg | ORAL_TABLET | Freq: Every day | ORAL | Status: DC
  Administered 2023-11-05: 40 mg via ORAL
  Filled 2023-11-05: qty 2

## 2023-11-05 MED ORDER — METHOCARBAMOL 1000 MG/10ML IJ SOLN: 500.0000 mg | Freq: Four times a day (QID) | INTRAMUSCULAR | Status: DC | PRN

## 2023-11-05 MED ORDER — POVIDONE-IODINE 10 % EX SWAB: 2.0000 | Freq: Once | CUTANEOUS | Status: DC

## 2023-11-05 MED ORDER — SODIUM CHLORIDE 0.9 % IR SOLN
Status: DC | PRN
  Administered 2023-11-05: 1000 mL

## 2023-11-05 MED ORDER — PROPOFOL 1000 MG/100ML IV EMUL
INTRAVENOUS | Status: AC
  Filled 2023-11-05: qty 100

## 2023-11-05 MED ORDER — MENTHOL 3 MG MT LOZG: 1.0000 | LOZENGE | OROMUCOSAL | Status: DC | PRN

## 2023-11-05 MED ORDER — LACTATED RINGERS IV SOLN: INTRAVENOUS | Status: DC

## 2023-11-05 MED ORDER — ORAL CARE MOUTH RINSE: 15.0000 mL | Freq: Once | OROMUCOSAL | Status: AC

## 2023-11-05 MED ORDER — LACTATED RINGERS IV BOLUS
1000.0000 mL | Freq: Once | INTRAVENOUS | Status: AC
  Administered 2023-11-05: 1000 mL via INTRAVENOUS

## 2023-11-05 MED ORDER — ALBUMIN HUMAN 5 % IV SOLN
INTRAVENOUS | Status: AC
  Filled 2023-11-05: qty 250

## 2023-11-05 MED ORDER — DIPHENHYDRAMINE HCL 12.5 MG/5ML PO ELIX: 12.5000 mg | ORAL_SOLUTION | ORAL | Status: DC | PRN

## 2023-11-05 MED ORDER — BUPIVACAINE LIPOSOME 1.3 % IJ SUSP: 20.0000 mL | Freq: Once | INTRAMUSCULAR | Status: DC

## 2023-11-05 MED ORDER — PHENYLEPHRINE HCL-NACL 20-0.9 MG/250ML-% IV SOLN
INTRAVENOUS | Status: AC
  Filled 2023-11-05: qty 250

## 2023-11-05 MED ORDER — TRANEXAMIC ACID-NACL 1000-0.7 MG/100ML-% IV SOLN
1000.0000 mg | INTRAVENOUS | Status: DC
  Filled 2023-11-05: qty 100

## 2023-11-05 MED ORDER — METHOCARBAMOL 500 MG PO TABS
500.0000 mg | ORAL_TABLET | Freq: Four times a day (QID) | ORAL | Status: DC | PRN
  Administered 2023-11-05 – 2023-11-06 (×2): 500 mg via ORAL
  Filled 2023-11-05 (×2): qty 1

## 2023-11-05 MED ORDER — FENTANYL CITRATE PF 50 MCG/ML IJ SOSY
50.0000 ug | PREFILLED_SYRINGE | INTRAMUSCULAR | Status: DC
Start: 2023-11-05 — End: 2023-11-05
  Administered 2023-11-05: 50 ug via INTRAVENOUS
  Filled 2023-11-05: qty 2

## 2023-11-05 MED ORDER — LACTATED RINGERS IV SOLN: INTRAVENOUS | Status: DC | PRN

## 2023-11-05 MED ORDER — ROPIVACAINE HCL 5 MG/ML IJ SOLN
INTRAMUSCULAR | Status: DC | PRN
  Administered 2023-11-05: 20 mL via PERINEURAL

## 2023-11-05 MED ORDER — ONDANSETRON HCL 4 MG PO TABS: 4.0000 mg | ORAL_TABLET | Freq: Four times a day (QID) | ORAL | Status: DC | PRN

## 2023-11-05 MED ORDER — PROPOFOL 500 MG/50ML IV EMUL
INTRAVENOUS | Status: DC | PRN
  Administered 2023-11-05: 80 ug/kg/min via INTRAVENOUS

## 2023-11-05 MED ORDER — HYDROMORPHONE HCL 1 MG/ML IJ SOLN: 0.5000 mg | INTRAMUSCULAR | Status: DC | PRN

## 2023-11-05 MED ORDER — HYDROMORPHONE HCL 2 MG PO TABS: 1.0000 mg | ORAL_TABLET | ORAL | Status: DC | PRN

## 2023-11-05 MED ORDER — ALBUMIN HUMAN 5 % IV SOLN
12.5000 g | Freq: Once | INTRAVENOUS | Status: AC
  Administered 2023-11-05: 12.5 g via INTRAVENOUS

## 2023-11-05 MED ORDER — GLYCOPYRROLATE 0.2 MG/ML IJ SOLN
INTRAMUSCULAR | Status: AC
  Filled 2023-11-05: qty 1

## 2023-11-05 MED ORDER — PHENYLEPHRINE 80 MCG/ML (10ML) SYRINGE FOR IV PUSH (FOR BLOOD PRESSURE SUPPORT)
PREFILLED_SYRINGE | INTRAVENOUS | Status: AC
  Filled 2023-11-05: qty 10

## 2023-11-05 MED ORDER — ACETAMINOPHEN 10 MG/ML IV SOLN
1000.0000 mg | Freq: Four times a day (QID) | INTRAVENOUS | Status: DC
  Administered 2023-11-05: 1000 mg via INTRAVENOUS
  Filled 2023-11-05: qty 100

## 2023-11-05 MED ORDER — EPHEDRINE 5 MG/ML INJ
INTRAVENOUS | Status: AC
  Filled 2023-11-05: qty 5

## 2023-11-05 MED ORDER — STERILE WATER FOR IRRIGATION IR SOLN
Status: DC | PRN
  Administered 2023-11-05: 1000 mL

## 2023-11-05 MED ORDER — POLYETHYLENE GLYCOL 3350 17 G PO PACK: 17.0000 g | PACK | Freq: Every day | ORAL | Status: DC | PRN

## 2023-11-05 MED ORDER — DOCUSATE SODIUM 100 MG PO CAPS
100.0000 mg | ORAL_CAPSULE | Freq: Two times a day (BID) | ORAL | Status: DC
  Administered 2023-11-05 – 2023-11-06 (×3): 100 mg via ORAL
  Filled 2023-11-05 (×3): qty 1

## 2023-11-05 MED ORDER — MIRABEGRON ER 25 MG PO TB24
25.0000 mg | ORAL_TABLET | Freq: Every day | ORAL | Status: DC
  Administered 2023-11-06: 25 mg via ORAL
  Filled 2023-11-05: qty 1

## 2023-11-05 MED ORDER — SODIUM CHLORIDE 0.9 % IV SOLN: INTRAVENOUS | Status: DC

## 2023-11-05 MED ORDER — BUPIVACAINE LIPOSOME 1.3 % IJ SUSP
INTRAMUSCULAR | Status: AC
  Filled 2023-11-05: qty 20

## 2023-11-05 MED ORDER — DEXAMETHASONE SODIUM PHOSPHATE 10 MG/ML IJ SOLN
8.0000 mg | Freq: Once | INTRAMUSCULAR | Status: AC
  Administered 2023-11-05: 4 mg via INTRAVENOUS

## 2023-11-05 MED ORDER — DEXAMETHASONE SODIUM PHOSPHATE 10 MG/ML IJ SOLN
INTRAMUSCULAR | Status: AC
  Filled 2023-11-05: qty 1

## 2023-11-05 MED ORDER — BISACODYL 10 MG RE SUPP: 10.0000 mg | Freq: Every day | RECTAL | Status: DC | PRN

## 2023-11-05 MED ORDER — METOCLOPRAMIDE HCL 5 MG/ML IJ SOLN: 5.0000 mg | Freq: Three times a day (TID) | INTRAMUSCULAR | Status: DC | PRN

## 2023-11-05 MED ORDER — ONDANSETRON HCL 4 MG/2ML IJ SOLN
INTRAMUSCULAR | Status: AC
  Filled 2023-11-05: qty 2

## 2023-11-05 MED ORDER — BUPIVACAINE LIPOSOME 1.3 % IJ SUSP
INTRAMUSCULAR | Status: DC | PRN
  Administered 2023-11-05: 20 mL

## 2023-11-05 MED ORDER — METOPROLOL TARTRATE 25 MG PO TABS
25.0000 mg | ORAL_TABLET | Freq: Two times a day (BID) | ORAL | Status: DC
  Administered 2023-11-05 – 2023-11-06 (×2): 25 mg via ORAL
  Filled 2023-11-05 (×2): qty 1

## 2023-11-05 MED ORDER — APIXABAN 2.5 MG PO TABS
2.5000 mg | ORAL_TABLET | Freq: Two times a day (BID) | ORAL | Status: DC
  Administered 2023-11-06: 2.5 mg via ORAL
  Filled 2023-11-05: qty 1

## 2023-11-05 MED ORDER — CHLORHEXIDINE GLUCONATE 0.12 % MT SOLN
15.0000 mL | Freq: Once | OROMUCOSAL | Status: AC
  Administered 2023-11-05: 15 mL via OROMUCOSAL

## 2023-11-05 MED ORDER — ATROPINE SULFATE 1 MG/ML IV SOLN
INTRAVENOUS | Status: DC | PRN
  Administered 2023-11-05 (×2): .5 mg via INTRAVENOUS

## 2023-11-05 MED ORDER — HYDROMORPHONE HCL 2 MG PO TABS
2.0000 mg | ORAL_TABLET | ORAL | Status: DC | PRN
  Administered 2023-11-05: 2 mg via ORAL
  Administered 2023-11-05: 3 mg via ORAL
  Filled 2023-11-05: qty 2
  Filled 2023-11-05: qty 1

## 2023-11-05 MED ORDER — EPHEDRINE SULFATE (PRESSORS) 50 MG/ML IJ SOLN
INTRAMUSCULAR | Status: DC | PRN
  Administered 2023-11-05: 10 mg via INTRAVENOUS

## 2023-11-05 MED ORDER — TRANEXAMIC ACID-NACL 1000-0.7 MG/100ML-% IV SOLN
INTRAVENOUS | Status: DC | PRN
  Administered 2023-11-05: 1000 mg via INTRAVENOUS

## 2023-11-05 MED ORDER — MIDAZOLAM HCL 2 MG/2ML IJ SOLN: 1.0000 mg | INTRAMUSCULAR | Status: DC

## 2023-11-05 MED ORDER — AMISULPRIDE (ANTIEMETIC) 5 MG/2ML IV SOLN: 10.0000 mg | Freq: Once | INTRAVENOUS | Status: DC | PRN

## 2023-11-05 MED ORDER — SODIUM CHLORIDE (PF) 0.9 % IJ SOLN
INTRAMUSCULAR | Status: DC | PRN
  Administered 2023-11-05: 60 mL

## 2023-11-05 MED ORDER — PHENYLEPHRINE HCL (PRESSORS) 10 MG/ML IV SOLN
INTRAVENOUS | Status: DC | PRN
Start: 2023-11-05 — End: 2023-11-05
  Administered 2023-11-05 (×3): 160 ug via INTRAVENOUS
  Administered 2023-11-05: 80 ug via INTRAVENOUS

## 2023-11-05 MED ORDER — DULOXETINE HCL 60 MG PO CPEP
60.0000 mg | ORAL_CAPSULE | Freq: Every day | ORAL | Status: DC
  Administered 2023-11-05: 60 mg via ORAL
  Filled 2023-11-05: qty 1

## 2023-11-05 MED ORDER — ONDANSETRON HCL 4 MG/2ML IJ SOLN: 4.0000 mg | Freq: Four times a day (QID) | INTRAMUSCULAR | Status: DC | PRN

## 2023-11-05 MED ORDER — ACETAMINOPHEN 500 MG PO TABS
1000.0000 mg | ORAL_TABLET | Freq: Four times a day (QID) | ORAL | Status: AC
  Administered 2023-11-05 – 2023-11-06 (×3): 1000 mg via ORAL
  Filled 2023-11-05 (×4): qty 2

## 2023-11-05 MED ORDER — PHENOL 1.4 % MT LIQD: 1.0000 | OROMUCOSAL | Status: DC | PRN

## 2023-11-05 MED ORDER — LOSARTAN POTASSIUM 50 MG PO TABS
50.0000 mg | ORAL_TABLET | Freq: Every day | ORAL | Status: DC
  Administered 2023-11-06: 50 mg via ORAL
  Filled 2023-11-05: qty 1

## 2023-11-05 MED ORDER — LEVOTHYROXINE SODIUM 25 MCG PO TABS
25.0000 ug | ORAL_TABLET | Freq: Every day | ORAL | Status: DC
  Administered 2023-11-06: 25 ug via ORAL
  Filled 2023-11-05: qty 1

## 2023-11-05 MED ORDER — CEFAZOLIN SODIUM-DEXTROSE 2-4 GM/100ML-% IV SOLN
2.0000 g | Freq: Four times a day (QID) | INTRAVENOUS | Status: AC
  Administered 2023-11-05 (×2): 2 g via INTRAVENOUS
  Filled 2023-11-05 (×2): qty 100

## 2023-11-05 MED ORDER — TRANEXAMIC ACID 1000 MG/10ML IV SOLN
2000.0000 mg | Freq: Once | INTRAVENOUS | Status: DC
  Filled 2023-11-05: qty 20

## 2023-11-05 SURGICAL SUPPLY — 45 items
ATTUNE MED DOME PAT 41 KNEE (Knees) IMPLANT
ATTUNE PS FEM LT SZ 8 CEM KNEE (Femur) IMPLANT
ATTUNE PSRP INSR SZ8 6 KNEE (Insert) IMPLANT
BAG COUNTER SPONGE SURGICOUNT (BAG) IMPLANT
BAG ZIPLOCK 12X15 (MISCELLANEOUS) ×1 IMPLANT
BASE TIBIAL ROT PLAT SZ 8 KNEE (Knees) IMPLANT
BLADE SAG 18X100X1.27 (BLADE) ×1 IMPLANT
BLADE SAW SGTL 11.0X1.19X90.0M (BLADE) ×1 IMPLANT
BNDG ELASTIC 4X5.8 VLCR NS LF (GAUZE/BANDAGES/DRESSINGS) IMPLANT
BNDG ELASTIC 6INX 5YD STR LF (GAUZE/BANDAGES/DRESSINGS) ×1 IMPLANT
BOWL SMART MIX CTS (DISPOSABLE) ×1 IMPLANT
CEMENT HV SMART SET (Cement) ×2 IMPLANT
COVER SURGICAL LIGHT HANDLE (MISCELLANEOUS) ×1 IMPLANT
CUFF TRNQT CYL 34X4.125X (TOURNIQUET CUFF) ×1 IMPLANT
DERMABOND ADVANCED .7 DNX12 (GAUZE/BANDAGES/DRESSINGS) ×1 IMPLANT
DRAPE U-SHAPE 47X51 STRL (DRAPES) ×1 IMPLANT
DRSG AQUACEL AG ADV 3.5X10 (GAUZE/BANDAGES/DRESSINGS) ×1 IMPLANT
DURAPREP 26ML APPLICATOR (WOUND CARE) ×1 IMPLANT
ELECT REM PT RETURN 15FT ADLT (MISCELLANEOUS) ×1 IMPLANT
GLOVE BIO SURGEON STRL SZ 6.5 (GLOVE) IMPLANT
GLOVE BIO SURGEON STRL SZ7 (GLOVE) IMPLANT
GLOVE BIO SURGEON STRL SZ8 (GLOVE) ×1 IMPLANT
GLOVE BIOGEL PI IND STRL 7.0 (GLOVE) IMPLANT
GLOVE BIOGEL PI IND STRL 8 (GLOVE) ×1 IMPLANT
GOWN STRL REUS W/ TWL LRG LVL3 (GOWN DISPOSABLE) ×1 IMPLANT
HOLDER FOLEY CATH W/STRAP (MISCELLANEOUS) IMPLANT
IMMOBILIZER KNEE 20 (SOFTGOODS) ×1 IMPLANT
IMMOBILIZER KNEE 20 THIGH 36 (SOFTGOODS) ×1 IMPLANT
KIT TURNOVER KIT A (KITS) IMPLANT
MANIFOLD NEPTUNE II (INSTRUMENTS) ×1 IMPLANT
NS IRRIG 1000ML POUR BTL (IV SOLUTION) ×1 IMPLANT
PACK TOTAL KNEE CUSTOM (KITS) ×1 IMPLANT
PADDING CAST COTTON 6X4 STRL (CAST SUPPLIES) ×2 IMPLANT
PIN STEINMAN FIXATION KNEE (PIN) IMPLANT
PROTECTOR NERVE ULNAR (MISCELLANEOUS) ×1 IMPLANT
SET HNDPC FAN SPRY TIP SCT (DISPOSABLE) ×1 IMPLANT
SUT MNCRL AB 4-0 PS2 18 (SUTURE) ×1 IMPLANT
SUT STRATAFIX 0 PDS 27 VIOLET (SUTURE) ×1 IMPLANT
SUT VIC AB 2-0 CT1 TAPERPNT 27 (SUTURE) ×3 IMPLANT
SUTURE STRATFX 0 PDS 27 VIOLET (SUTURE) ×1 IMPLANT
TIBIAL BASE ROT PLAT SZ 8 KNEE (Knees) ×1 IMPLANT
TRAY FOLEY MTR SLVR 16FR STAT (SET/KITS/TRAYS/PACK) IMPLANT
TUBE SUCTION HIGH CAP CLEAR NV (SUCTIONS) ×1 IMPLANT
WATER STERILE IRR 1000ML POUR (IV SOLUTION) ×2 IMPLANT
WRAP KNEE MAXI GEL POST OP (GAUZE/BANDAGES/DRESSINGS) ×1 IMPLANT

## 2023-11-05 NOTE — Progress Notes (Signed)
 Orthopedic Tech Progress Note Patient Details:  Kirk [name] 01-Jul-1946 [id number]  CPM Left Knee CPM Left Knee: On Left Knee Flexion (Degrees): 40 Left Knee Extension (Degrees): 10  Post Interventions Patient Tolerated: Well  [name] 11/05/2023, 10:10 AM

## 2023-11-05 NOTE — Discharge Instructions (Signed)
 Ollen Gross, MD Total Joint Specialist [facility] [place] [address], [address] [place], [place] [address] [phone]  TOTAL KNEE REPLACEMENT POSTOPERATIVE DIRECTIONS    Knee Rehabilitation, Guidelines Following Surgery  Results after knee surgery are often greatly improved when you follow the exercise, range of motion and muscle strengthening exercises prescribed by your doctor. Safety measures are also important to protect the knee from further injury. If any of these exercises cause you to have increased pain or swelling in your knee joint, decrease the amount until you are comfortable again and slowly increase them. If you have problems or questions, call your caregiver or physical therapist for advice.   BLOOD CLOT PREVENTION Resume your regular dose of Eliquis and Aspirin upon discharge from the hospital. You may resume your vitamins/supplements upon discharge from the hospital. Do not take any NSAIDs (Advil, Aleve, Ibuprofen, Meloxicam, etc.) while taking Eliquis.     HOME CARE INSTRUCTIONS  Remove items at home which could result in a fall. This includes throw rugs or furniture in walking pathways.  ICE to the affected knee as much as tolerated. Icing helps control swelling. If the swelling is well controlled you will be more comfortable and rehab easier. Continue to use ice on the knee for pain and swelling from surgery. You may notice swelling that will progress down to the foot and ankle. This is normal after surgery. Elevate the leg when you are not up walking on it.    Continue to use the breathing machine which will help keep your temperature down. It is common for your temperature to cycle up and down following surgery, especially at night when you are not up moving around and exerting yourself. The breathing machine keeps your lungs expanded and your temperature down. Do not place pillow under the operative knee, focus on keeping the knee straight while  resting  DIET You may resume your previous home diet once you are discharged from the hospital.  DRESSING / WOUND CARE / SHOWERING Keep your bulky bandage on for 2 days. On the third post-operative day you may remove the Ace bandage and gauze. There is a waterproof adhesive bandage on your skin which will stay in place until your first follow-up appointment. Once you remove this you will not need to place another bandage You may begin showering 3 days following surgery, but do not submerge the incision under water.  ACTIVITY For the first 5 days, the key is rest and control of pain and swelling Do your home exercises twice a day starting on post-operative day 3. On the days you go to physical therapy, just do the home exercises once that day. You should rest, ice and elevate the leg for 50 minutes out of every hour. Get up and walk/stretch for 10 minutes per hour. After 5 days you can increase your activity slowly as tolerated. Walk with your walker as instructed. Use the walker until you are comfortable transitioning to a cane. Walk with the cane in the opposite hand of the operative leg. You may discontinue the cane once you are comfortable and walking steadily. Avoid periods of inactivity such as sitting longer than an hour when not asleep. This helps prevent blood clots.  You may discontinue the knee immobilizer once you are able to perform a straight leg raise while lying down. You may resume a sexual relationship in one month or when given the OK by your doctor.  You may return to work once you are cleared  by your doctor.  Do not drive a car for 6 weeks or until released by your surgeon.  Do not drive while taking narcotics.  TED HOSE STOCKINGS Wear the elastic stockings on both legs for three weeks following surgery during the day. You may remove them at night for sleeping.  WEIGHT BEARING Weight bearing as tolerated with assist device (walker, cane, etc) as directed, use it as long  as suggested by your surgeon or therapist, typically at least 4-6 weeks.  POSTOPERATIVE CONSTIPATION PROTOCOL Constipation - defined medically as fewer than three stools per week and severe constipation as less than one stool per week.  One of the most common issues patients have following surgery is constipation.  Even if you have a regular bowel pattern at home, your normal regimen is likely to be disrupted due to multiple reasons following surgery.  Combination of anesthesia, postoperative narcotics, change in appetite and fluid intake all can affect your bowels.  In order to avoid complications following surgery, here are some recommendations in order to help you during your recovery period.  Colace (docusate) - Pick up an over-the-counter form of Colace or another stool softener and take twice a day as long as you are requiring postoperative pain medications.  Take with a full glass of water daily.  If you experience loose stools or diarrhea, hold the colace until you stool forms back up. If your symptoms do not get better within 1 week or if they get worse, check with your doctor. Dulcolax (bisacodyl) - Pick up over-the-counter and take as directed by the product packaging as needed to assist with the movement of your bowels.  Take with a full glass of water.  Use this product as needed if not relieved by Colace only.  MiraLax (polyethylene glycol) - Pick up over-the-counter to have on hand. MiraLax is a solution that will increase the amount of water in your bowels to assist with bowel movements.  Take as directed and can mix with a glass of water, juice, soda, coffee, or tea. Take if you go more than two days without a movement. Do not use MiraLax more than once per day. Call your doctor if you are still constipated or irregular after using this medication for 7 days in a row.  If you continue to have problems with postoperative constipation, please contact the office for further assistance and  recommendations.  If you experience "the worst abdominal pain ever" or develop nausea or vomiting, please contact the office immediatly for further recommendations for treatment.  ITCHING If you experience itching with your medications, try taking only a single pain pill, or even half a pain pill at a time.  You can also use Benadryl over the counter for itching or also to help with sleep.   MEDICATIONS See your medication summary on the "After Visit Summary" that the nursing staff will review with you prior to discharge.  You may have some home medications which will be placed on hold until you complete the course of blood thinner medication.  It is important for you to complete the blood thinner medication as prescribed by your surgeon.  Continue your approved medications as instructed at time of discharge.  PRECAUTIONS If you experience chest pain or shortness of breath - call 911 immediately for transfer to the hospital emergency department.  If you develop a fever greater that 101 F, purulent drainage from wound, increased redness or drainage from wound, foul odor from the wound/dressing, or calf pain -  CONTACT YOUR SURGEON.                                                   FOLLOW-UP APPOINTMENTS Make sure you keep all of your appointments after your operation with your surgeon and caregivers. You should call the office at the above phone number and make an appointment for approximately two weeks after the date of your surgery or on the date instructed by your surgeon outlined in the "After Visit Summary".  RANGE OF MOTION AND STRENGTHENING EXERCISES  Rehabilitation of the knee is important following a knee injury or an operation. After just a few days of immobilization, the muscles of the thigh which control the knee become weakened and shrink (atrophy). Knee exercises are designed to build up the tone and strength of the thigh muscles and to improve knee motion. Often times heat used for  twenty to thirty minutes before working out will loosen up your tissues and help with improving the range of motion but do not use heat for the first two weeks following surgery. These exercises can be done on a training (exercise) mat, on the floor, on a table or on a bed. Use what ever works the best and is most comfortable for you Knee exercises include:  Leg Lifts - While your knee is still immobilized in a splint or cast, you can do straight leg raises. Lift the leg to 60 degrees, hold for 3 sec, and slowly lower the leg. Repeat 10-20 times 2-3 times daily. Perform this exercise against resistance later as your knee gets better.  Quad and Hamstring Sets - Tighten up the muscle on the front of the thigh (Quad) and hold for 5-10 sec. Repeat this 10-20 times hourly. Hamstring sets are done by pushing the foot backward against an object and holding for 5-10 sec. Repeat as with quad sets.  Leg Slides: Lying on your back, slowly slide your foot toward your buttocks, bending your knee up off the floor (only go as far as is comfortable). Then slowly slide your foot back down until your leg is flat on the floor again. Angel Wings: Lying on your back spread your legs to the side as far apart as you can without causing discomfort.  A rehabilitation program following serious knee injuries can speed recovery and prevent re-injury in the future due to weakened muscles. Contact your doctor or a physical therapist for more information on knee rehabilitation.   POST-OPERATIVE OPIOID TAPER INSTRUCTIONS: It is important to wean off of your opioid medication as soon as possible. If you do not need pain medication after your surgery it is ok to stop day one. Opioids include: Codeine, Hydrocodone(Norco, Vicodin), Oxycodone(Percocet, oxycontin) and hydromorphone amongst others.  Long term and even short term use of opiods can cause: Increased pain response Dependence Constipation Depression Respiratory depression And  more.  Withdrawal symptoms can include Flu like symptoms Nausea, vomiting And more Techniques to manage these symptoms Hydrate well Eat regular healthy meals Stay active Use relaxation techniques(deep breathing, meditating, yoga) Do Not substitute Alcohol to help with tapering If you have been on opioids for less than two weeks and do not have pain than it is ok to stop all together.  Plan to wean off of opioids This plan should start within one week post op of your joint replacement. Maintain  the same interval or time between taking each dose and first decrease the dose.  Cut the total daily intake of opioids by one tablet each day Next start to increase the time between doses. The last dose that should be eliminated is the evening dose.   IF YOU ARE TRANSFERRED TO A SKILLED REHAB FACILITY If the patient is transferred to a skilled rehab facility following release from the hospital, a list of the current medications will be sent to the facility for the patient to continue.  When discharged from the skilled rehab facility, please have the facility set up the patient's Home Health Physical Therapy prior to being released. Also, the skilled facility will be responsible for providing the patient with their medications at time of release from the facility to include their pain medication, the muscle relaxants, and their blood thinner medication. If the patient is still at the rehab facility at time of the two week follow up appointment, the skilled rehab facility will also need to assist the patient in arranging follow up appointment in our office and any transportation needs.  MAKE SURE YOU:  Understand these instructions.  Get help right away if you are not doing well or get worse.   DENTAL ANTIBIOTICS:  In most cases prophylactic antibiotics for Dental procdeures after total joint surgery are not necessary.  Exceptions are as follows:  1. History of prior total joint infection  2.  Severely immunocompromised (Organ Transplant, cancer chemotherapy, Rheumatoid biologic meds such as Humera)  3. Poorly controlled diabetes (A1C &gt; 8.0, blood glucose over 200)  If you have one of these conditions, contact your surgeon for an antibiotic prescription, prior to your dental procedure.    Pick up stool softner and laxative for home use following surgery while on pain medications. Do not submerge incision under water. Please use good hand washing techniques while changing dressing each day. May shower starting three days after surgery. Please use a clean towel to pat the incision dry following showers. Continue to use ice for pain and swelling after surgery. Do not use any lotions or creams on the incision until instructed by your surgeon.

## 2023-11-05 NOTE — Anesthesia Postprocedure Evaluation (Signed)
 Anesthesia Post Note  Patient: Kirk [name]  Procedure(s) Performed: TOTAL KNEE ARTHROPLASTY (Left: Knee)     Patient location during evaluation: PACU Anesthesia Type: Regional and Spinal Level of consciousness: oriented and awake and alert Pain management: pain level controlled Vital Signs Assessment: post-procedure vital signs reviewed and stable Respiratory status: spontaneous breathing, respiratory function stable and patient connected to nasal cannula oxygen Cardiovascular status: blood pressure returned to baseline and stable Postop Assessment: no headache, no backache and no apparent nausea or vomiting Anesthetic complications: no  No notable events documented.  Last Vitals:  Vitals:   11/05/23 1408 11/05/23 1614  BP: (!) 97/57 (!) 97/58  Pulse: 93 92  Resp: 17 16  Temp: 36.7 C (!) 36.4 C  SpO2: 94% 93%    Last Pain:  Vitals:   11/05/23 1614  TempSrc: Oral  PainSc:                  Kirk [name] [name]

## 2023-11-05 NOTE — Interval H&P Note (Signed)
 History and Physical Interval Note:  11/05/2023 6:33 AM  Kirk [name]  has presented today for surgery, with the diagnosis of left knee osteoarthritis.  The various methods of treatment have been discussed with the patient and family. After consideration of risks, benefits and other options for treatment, the patient has consented to  Procedure(s): TOTAL KNEE ARTHROPLASTY (Left) as a surgical intervention.  The patient's history has been reviewed, patient examined, no change in status, stable for surgery.  I have reviewed the patient's chart and labs.  Questions were answered to the patient's satisfaction.     Homero Fellers Dicy Smigel

## 2023-11-05 NOTE — Evaluation (Signed)
 Physical Therapy Evaluation Patient Details Name: Kirk [name] MRN: [id number] DOB: [birth year] Today's Date: 11/05/2023  History of Present Illness  R knee Pt is 78 yo male s/p L TKA on 11/05/23.  Pt with hx including but not limited to arthritis, R LE DVT, CKD, DM2, PE, anal CA, afib, CABG, MI, seizure, cervical fusion, R TKA 10/24  Clinical Impression  Pt is s/p TKA resulting in the deficits listed below (see PT Problem List). At baseline, pt independent.  Reports he did very well after prior RTKA.  He DME, good home set up, and support.  Today, pt limited by lightheadedness and dizziness (see comment).  He did sit EOB but unable to progress further. He had excellent pain control, ROM, and quad activation.  Pt expected to progress well as dizziness resolves. Pt will benefit from acute skilled PT to increase their independence and safety with mobility to allow discharge.          If plan is discharge home, recommend the following: A little help with walking and/or transfers;A little help with bathing/dressing/bathroom;Help with stairs or ramp for entrance;Assistance with cooking/housework   Can travel by private vehicle        Equipment Recommendations None recommended by PT  Recommendations for Other Services       Functional Status Assessment Patient has had a recent decline in their functional status and demonstrates the ability to make significant improvements in function in a reasonable and predictable amount of time.     Precautions / Restrictions Precautions Precautions: Fall Required Braces or Orthoses: Knee Immobilizer - Left Knee Immobilizer - Left: Discontinue once straight leg raise with < 10 degree lag Restrictions LLE Weight Bearing Per Provider Order: Weight bearing as tolerated      Mobility  Bed Mobility Overal bed mobility: Needs Assistance Bed Mobility: Supine to Sit, Sit to Supine     Supine to sit: Min assist Sit to supine: Min assist   General bed  mobility comments: ligth min A for L LE    Transfers                   General transfer comment: Not able to stand due to lightheadedness/dizziness (see comments).  Did lateral scoot toward [facility] with supervision    Ambulation/Gait                  Stairs            Wheelchair Mobility     Tilt Bed    Modified Rankin (Stroke Patients Only)       Balance Overall balance assessment: Needs assistance Sitting-balance support: No upper extremity supported Sitting balance-Leahy Scale: Fair Sitting balance - Comments: Reports lightheaded/dizzy with movement - occasionally provided CGA for safety       Standing balance comment: not attempted                             Pertinent Vitals/Pain Pain Assessment Pain Assessment: No/denies pain    Home Living Family/patient expects to be discharged to:: Private residence Living Arrangements: Spouse/significant other Available Help at Discharge: Family;Available 24 hours/day Type of Home: House Home Access: Stairs to enter Entrance Stairs-Rails: Doctor, general practice of Steps: 3   Home Layout: Two level;Able to live on main level with bedroom/bathroom Home Equipment: Crutches;Cane - single point;Grab bars - tub/shower;BSC/3in1;Shower seat;Toilet riser;Rolling Walker (2 wheels)      Prior Function Prior Level of Function :  Independent/Modified Independent;Driving             Mobility Comments: Ambulated without AD; could ambulate in commuity; reports did very well with recent R TKA ADLs Comments: Independent adls and iadls     Extremity/Trunk Assessment   Upper Extremity Assessment Upper Extremity Assessment: Overall WFL for tasks assessed    Lower Extremity Assessment Lower Extremity Assessment: LLE deficits/detail;RLE deficits/detail RLE Deficits / Details: ROM WFL; MMT 5/5 RLE Sensation: WNL LLE Deficits / Details: Expected post op changes; ROM: 5 to 80 degrees at  knee; MMT: ankle 5/5, knee and hip 3/5 not further tested LLE Sensation: WNL    Cervical / Trunk Assessment Cervical / Trunk Assessment: Normal  Communication        Cognition Arousal: Alert Behavior During Therapy: WFL for tasks assessed/performed   PT - Cognitive impairments: No apparent impairments                                 Cueing       General Comments  Pt had c/o lightheadedness, "I almost passed out," upon sitting EOB.  His BP was 91/62 initially and 105/63 at 3 mins.  This is consistent with prior supine values since surgery (last BP in supine was 97/57).  Pt symptoms somewhat subsided but every time he would lean forward or move head reports dizzy and lightheaded again.  Sat EOB 10 mins and symptoms remained consistent.  Pt not safe to stand and returned to supine.  Further questioned about his symptoms dizzy vs lightheaded.  Reports definitely lightheaded -particularly with initial transfer and when moving at EOB.  However, also reports spinning when he turns his head and looks up/down.  Did note that pt's eyes dilated.  They did constrict to light equally.  EOEM and gaze stabilization intact.  No nystagmus -resting nor gaze induced.  No other neuro symptoms. Wife asked about his blood sugar, ate about 4 hr ago, diabetic.   Pt alert , oriented, good spirits.  Left pt in bed, in supine, head elevated.  Notified RN of above of dizzy/lightheaded, dilated eyes, wife asked about glucose.     Exercises     Assessment/Plan    PT Assessment Patient needs continued PT services  PT Problem List Decreased strength;Decreased range of motion;Decreased activity tolerance;Decreased balance;Decreased mobility;Decreased knowledge of use of DME       PT Treatment Interventions DME instruction;Therapeutic exercise;Gait training;Balance training;Stair training;Functional mobility training;Therapeutic activities;Patient/family education;Modalities    PT Goals (Current goals  can be found in the Care Plan section)  Acute Rehab PT Goals Patient Stated Goal: return home PT Goal Formulation: With patient/family Time For Goal Achievement: 11/19/23 Potential to Achieve Goals: Good    Frequency 7X/week     Co-evaluation               AM-PAC PT "6 Clicks" Mobility  Outcome Measure Help needed turning from your back to your side while in a flat bed without using bedrails?: A Little Help needed moving from lying on your back to sitting on the side of a flat bed without using bedrails?: A Little Help needed moving to and from a bed to a chair (including a wheelchair)?: Total Help needed standing up from a chair using your arms (e.g., wheelchair or bedside chair)?: Total Help needed to walk in hospital room?: Total Help needed climbing 3-5 steps with a railing? : Total 6 Click Score: 10  End of Session Equipment Utilized During Treatment: Gait belt Activity Tolerance: Treatment limited secondary to medical complications (Comment) Patient left: in bed;with call bell/phone within reach;with bed alarm set Nurse Communication: Mobility status;Other (comment) (c/o lightheadedness with transfers, but also dizzy with head turns and eyes dilated.  Wife mentioned checking glucose. BP soft but stable) PT Visit Diagnosis: Muscle weakness (generalized) (M62.81);Other abnormalities of gait and mobility (R26.89)    Time: 1610-9604 PT Time Calculation (min) (ACUTE ONLY): 27 min   Charges:   PT Evaluation $PT Eval Low Complexity: 1 Low PT Treatments $Therapeutic Activity: 8-22 mins PT General Charges $$ ACUTE PT VISIT: 1 Visit         Anise Salvo, PT Acute Rehab [facility] [phone]   Rayetta Humphrey 11/05/2023, 4:19 PM

## 2023-11-05 NOTE — Plan of Care (Signed)
  Problem: Nutrition: Goal: Adequate nutrition will be maintained Outcome: Progressing   Problem: Coping: Goal: Level of anxiety will decrease Outcome: Progressing   Problem: Pain Managment: Goal: General experience of comfort will improve and/or be controlled Outcome: Progressing   Problem: Safety: Goal: Ability to remain free from injury will improve Outcome: Progressing   Problem: Skin Integrity: Goal: Risk for impaired skin integrity will decrease Outcome: Progressing   Problem: Pain Management: Goal: Pain level will decrease with appropriate interventions Outcome: Progressing

## 2023-11-05 NOTE — Op Note (Signed)
 OPERATIVE REPORT-TOTAL KNEE ARTHROPLASTY   Pre-operative diagnosis- Osteoarthritis  Left knee(s)  Post-operative diagnosis- Osteoarthritis Left knee(s)  Procedure-  Left  Total Knee Arthroplasty  Surgeon- Gus Rankin. Prezley Qadir, MD  Assistant- Weston Brass, PA-C   Anesthesia-   Adductor canal block and spinal  EBL- 25 ml   Drains None  Tourniquet time-  Total Tourniquet Time Documented: Thigh (Left) - 40 minutes Total: Thigh (Left) - 40 minutes     Complications- None  Condition-PACU - hemodynamically stable.   Brief Clinical Note   Kirk [name] is a 78 y.o. year old male with end stage OA of his left knee with progressively worsening pain and dysfunction. He has constant pain, with activity and at rest and significant functional deficits with difficulties even with ADLs. He has had extensive non-op management including analgesics, injections of cortisone and viscosupplements, and home exercise program, but remains in significant pain with significant dysfunction. Radiographs show bone on bone arthritis medial and patellofemoral. He presents now for left Total Knee Arthroplasty.     Procedure in detail---   The patient is brought into the operating room and positioned supine on the operating table. After successful administration of  Adductor canal block and spinal,   a tourniquet is placed high on the  Left thigh(s) and the lower extremity is prepped and draped in the usual sterile fashion. Time out is performed by the operating team and then the  Left lower extremity is wrapped in Esmarch, knee flexed and the tourniquet inflated to 300 mmHg.       A midline incision is made with a ten blade through the subcutaneous tissue to the level of the extensor mechanism. A fresh blade is used to make a medial parapatellar arthrotomy. Soft tissue over the proximal medial tibia is subperiosteally elevated to the joint line with a knife and into the semimembranosus bursa with a Cobb  elevator. Soft tissue over the proximal lateral tibia is elevated with attention being paid to avoiding the patellar tendon on the tibial tubercle. The patella is everted, knee flexed 90 degrees and the ACL and PCL are removed. Findings are bone on bone medial and patellofemoral with large global osteophytes        The drill is used to create a starting hole in the distal femur and the canal is thoroughly irrigated with sterile saline to remove the fatty contents. The 5 degree Left  valgus alignment guide is placed into the femoral canal and the distal femoral cutting block is pinned to remove 10 mm off the distal femur. Resection is made with an oscillating saw.      The tibia is subluxed forward and the menisci are removed. The extramedullary alignment guide is placed referencing proximally at the medial aspect of the tibial tubercle and distally along the second metatarsal axis and tibial crest. The block is pinned to remove 2mm off the more deficient medial  side. Resection is made with an oscillating saw. Size 8is the most appropriate size for the tibia and the proximal tibia is prepared with the modular drill and keel punch for that size.      The femoral sizing guide is placed and size 8 is most appropriate. Rotation is marked off the epicondylar axis and confirmed by creating a rectangular flexion gap at 90 degrees. The size 8 cutting block is pinned in this rotation and the anterior, posterior and chamfer cuts are made with the oscillating saw. The intercondylar block is then placed and that  cut is made.      Trial size 8 tibial component, trial size 8 posterior stabilized femur and a 6  mm posterior stabilized rotating platform insert trial is placed. Full extension is achieved with excellent varus/valgus and anterior/posterior balance throughout full range of motion. The patella is everted and thickness measured to be 27  mm. Free hand resection is taken to 15 mm, a 41 template is placed, lug holes  are drilled, trial patella is placed, and it tracks normally. Osteophytes are removed off the posterior femur with the trial in place. All trials are removed and the cut bone surfaces prepared with pulsatile lavage. Cement is mixed and once ready for implantation, the size 8 tibial implant, size  8 posterior stabilized femoral component, and the size 41 patella are cemented in place and the patella is held with the clamp. The trial insert is placed and the knee held in full extension. The Exparel (20 ml mixed with 60 ml saline) is injected into the extensor mechanism, posterior capsule, medial and lateral gutters and subcutaneous tissues.  All extruded cement is removed and once the cement is hard the permanent 6 mm posterior stabilized rotating platform insert is placed into the tibial tray.      The wound is copiously irrigated with saline solution and the extensor mechanism closed with # 0 Stratofix suture. The tourniquet is released for a total tourniquet time of 40  minutes. Flexion against gravity is 140 degrees and the patella tracks normally. Subcutaneous tissue is closed with 2.0 vicryl and subcuticular with running 4.0 Monocryl. The incision is cleaned and dried and steri-strips and a bulky sterile dressing are applied. The limb is placed into a knee immobilizer and the patient is awakened and transported to recovery in stable condition.      Please note that a surgical assistant was a medical necessity for this procedure in order to perform it in a safe and expeditious manner. Surgical assistant was necessary to retract the ligaments and vital neurovascular structures to prevent injury to them and also necessary for proper positioning of the limb to allow for anatomic placement of the prosthesis.   Gus Rankin Delores Edelstein, MD    11/05/2023, 9:22 AM

## 2023-11-05 NOTE — Anesthesia Procedure Notes (Signed)
 Spinal  Patient location during procedure: OR Start time: 11/05/2023 8:14 AM End time: 11/05/2023 8:16 AM Reason for block: surgical anesthesia Staffing Performed: anesthesiologist  Anesthesiologist: Elmer Picker, MD Performed by: Elmer Picker, MD Authorized by: Elmer Picker, MD   Preanesthetic Checklist Completed: patient identified, IV checked, risks and benefits discussed, surgical consent, monitors and equipment checked, pre-op evaluation and timeout performed Spinal Block Patient position: sitting Prep: DuraPrep and site prepped and draped Patient monitoring: cardiac monitor, continuous pulse ox and blood pressure Approach: midline Location: L3-4 Injection technique: single-shot Needle Needle type: Pencan  Needle gauge: 24 G Needle length: 9 cm Assessment Sensory level: T6 Events: CSF return Additional Notes Functioning IV was confirmed and monitors were applied. Sterile prep and drape, including hand hygiene and sterile gloves were used. The patient was positioned and the spine was prepped. The skin was anesthetized with lidocaine.  Free flow of clear CSF was obtained prior to injecting local anesthetic into the CSF.  The spinal needle aspirated freely following injection.  The needle was carefully withdrawn.  The patient tolerated the procedure well.

## 2023-11-05 NOTE — Anesthesia Procedure Notes (Signed)
 Anesthesia Regional Block: Adductor canal block   Pre-Anesthetic Checklist: , timeout performed,  Correct Patient, Correct Site, Correct Laterality,  Correct Procedure, Correct Position, site marked,  Risks and benefits discussed,  Pre-op evaluation,  At surgeon's request and post-op pain management  Laterality: Left  Prep: Maximum Sterile Barrier Precautions used, chloraprep       Needles:  Injection technique: Single-shot  Needle Type: Echogenic Stimulator Needle     Needle Length: 9cm  Needle Gauge: 21     Additional Needles:   Procedures:,,,, ultrasound used (permanent image in chart),,    Narrative:  Start time: 11/05/2023 7:55 AM End time: 11/05/2023 7:58 AM Injection made incrementally with aspirations every 5 mL. Anesthesiologist: Elmer Picker, MD

## 2023-11-05 NOTE — Transfer of Care (Signed)
 Immediate Anesthesia Transfer of Care Note  Patient: Kirk [name]  Procedure(s) Performed: TOTAL KNEE ARTHROPLASTY (Left: Knee)  Patient Location: PACU  Anesthesia Type:MAC and Spinal  Level of Consciousness: awake and alert   Airway & Oxygen Therapy: Patient Spontanous Breathing and Patient connected to face mask oxygen  Post-op Assessment: Report given to RN and Post -op Vital signs reviewed and stable  Post vital signs: Reviewed and stable  Last Vitals:  Vitals Value Taken Time  BP 114/97 11/05/23 0950  Temp    Pulse    Resp 18 11/05/23 0951  SpO2    Vitals shown include unfiled device data.  Last Pain:  Vitals:   11/05/23 0637  TempSrc: Oral  PainSc:          Complications: No notable events documented.

## 2023-11-06 ENCOUNTER — Encounter (HOSPITAL_COMMUNITY): Payer: Self-pay | Admitting: Orthopedic Surgery

## 2023-11-06 DIAGNOSIS — Z85048 Personal history of other malignant neoplasm of rectum, rectosigmoid junction, and anus: Secondary | ICD-10-CM | POA: Diagnosis not present

## 2023-11-06 DIAGNOSIS — Z951 Presence of aortocoronary bypass graft: Secondary | ICD-10-CM | POA: Diagnosis not present

## 2023-11-06 DIAGNOSIS — M1712 Unilateral primary osteoarthritis, left knee: Secondary | ICD-10-CM | POA: Diagnosis not present

## 2023-11-06 DIAGNOSIS — I2510 Atherosclerotic heart disease of native coronary artery without angina pectoris: Secondary | ICD-10-CM | POA: Diagnosis not present

## 2023-11-06 DIAGNOSIS — Z86718 Personal history of other venous thrombosis and embolism: Secondary | ICD-10-CM | POA: Diagnosis not present

## 2023-11-06 DIAGNOSIS — Z86711 Personal history of pulmonary embolism: Secondary | ICD-10-CM | POA: Diagnosis not present

## 2023-11-06 DIAGNOSIS — E039 Hypothyroidism, unspecified: Secondary | ICD-10-CM | POA: Diagnosis not present

## 2023-11-06 DIAGNOSIS — Z7901 Long term (current) use of anticoagulants: Secondary | ICD-10-CM | POA: Diagnosis not present

## 2023-11-06 DIAGNOSIS — E1122 Type 2 diabetes mellitus with diabetic chronic kidney disease: Secondary | ICD-10-CM | POA: Diagnosis not present

## 2023-11-06 DIAGNOSIS — I4891 Unspecified atrial fibrillation: Secondary | ICD-10-CM | POA: Diagnosis not present

## 2023-11-06 DIAGNOSIS — I129 Hypertensive chronic kidney disease with stage 1 through stage 4 chronic kidney disease, or unspecified chronic kidney disease: Secondary | ICD-10-CM | POA: Diagnosis not present

## 2023-11-06 DIAGNOSIS — Z96651 Presence of right artificial knee joint: Secondary | ICD-10-CM | POA: Diagnosis not present

## 2023-11-06 DIAGNOSIS — N1831 Chronic kidney disease, stage 3a: Secondary | ICD-10-CM | POA: Diagnosis not present

## 2023-11-06 DIAGNOSIS — Z79899 Other long term (current) drug therapy: Secondary | ICD-10-CM | POA: Diagnosis not present

## 2023-11-06 LAB — BASIC METABOLIC PANEL
Anion gap: 11 (ref 5–15)
BUN: 19 mg/dL (ref 8–23)
CO2: 24 mmol/L (ref 22–32)
Calcium: 8.5 mg/dL — ABNORMAL LOW (ref 8.9–10.3)
Chloride: 99 mmol/L (ref 98–111)
Creatinine, Ser: 1.08 mg/dL (ref 0.61–1.24)
GFR, Estimated: >60 mL/min (ref >60)
Glucose, Bld: 157 mg/dL — ABNORMAL HIGH (ref 70–99)
Potassium: 4.4 mmol/L (ref 3.5–5.1)
Sodium: 134 mmol/L — ABNORMAL LOW (ref 135–145)

## 2023-11-06 LAB — CBC
HCT: 38.3 % — ABNORMAL LOW (ref 39.0–52.0)
Hemoglobin: 12.3 g/dL — ABNORMAL LOW (ref 13.0–17.0)
MCH: 31.1 pg (ref 26.0–34.0)
MCHC: 32.1 g/dL (ref 30.0–36.0)
MCV: 96.7 fL (ref 80.0–100.0)
Platelets: 143 10*3/uL — ABNORMAL LOW (ref 150–400)
RBC: 3.96 MIL/uL — ABNORMAL LOW (ref 4.22–5.81)
RDW: 13.4 % (ref 11.5–15.5)
WBC: 14.8 10*3/uL — ABNORMAL HIGH (ref 4.0–10.5)
nRBC: 0 % (ref 0.0–0.2)

## 2023-11-06 LAB — GLUCOSE, CAPILLARY
Glucose-Capillary: 132 mg/dL — ABNORMAL HIGH (ref 70–99)
Glucose-Capillary: 124 mg/dL — ABNORMAL HIGH (ref 70–99)

## 2023-11-06 MED ORDER — TRAMADOL HCL 50 MG PO TABS: 50.0000 mg | ORAL_TABLET | Freq: Four times a day (QID) | ORAL | 0 refills | Status: DC | PRN

## 2023-11-06 MED ORDER — ONDANSETRON HCL 4 MG PO TABS: 4.0000 mg | ORAL_TABLET | Freq: Four times a day (QID) | ORAL | 0 refills | Status: DC | PRN

## 2023-11-06 MED ORDER — ALUM & MAG HYDROXIDE-SIMETH 200-200-20 MG/5ML PO SUSP
30.0000 mL | ORAL | Status: DC | PRN
  Administered 2023-11-06: 30 mL via ORAL
  Filled 2023-11-06: qty 30

## 2023-11-06 MED ORDER — HYDROMORPHONE HCL 2 MG PO TABS
1.0000 mg | ORAL_TABLET | Freq: Three times a day (TID) | ORAL | 0 refills | Status: DC | PRN
Start: 2023-11-06 — End: 2024-05-20

## 2023-11-06 MED ORDER — SODIUM CHLORIDE 0.9 % IV BOLUS
1000.0000 mL | Freq: Once | INTRAVENOUS | Status: AC
  Administered 2023-11-06: 1000 mL via INTRAVENOUS

## 2023-11-06 MED ORDER — METHOCARBAMOL 500 MG PO TABS: 500.0000 mg | ORAL_TABLET | Freq: Four times a day (QID) | ORAL | 0 refills | Status: DC | PRN

## 2023-11-06 NOTE — Progress Notes (Signed)
 Physical Therapy Treatment Patient Details Name: Kirk [name] MRN: [id number] DOB: [birth year] Today's Date: 11/06/2023   History of Present Illness R knee Pt is 78 yo male s/p L TKA on 11/05/23.  Pt with hx including but not limited to arthritis, R LE DVT, CKD, DM2, PE, anal CA, afib, CABG, MI, seizure, cervical fusion, R TKA 10/24    PT Comments  Pt making good progress today.  He had dizziness earlier this morning per RN but has resolved at time of PT session.  Pt was able to ambulate 11' with RW and tolerated exercises well.  Plan to f/u in afternoon for stair training and advancing mobility/HEP.      If plan is discharge home, recommend the following: A little help with walking and/or transfers;A little help with bathing/dressing/bathroom;Help with stairs or ramp for entrance;Assistance with cooking/housework   Can travel by private vehicle        Equipment Recommendations  None recommended by PT    Recommendations for Other Services       Precautions / Restrictions Precautions Precautions: Fall Required Braces or Orthoses: Knee Immobilizer - Left Knee Immobilizer - Left: Discontinue once straight leg raise with < 10 degree lag Restrictions LLE Weight Bearing Per Provider Order: Weight bearing as tolerated     Mobility  Bed Mobility Overal bed mobility: Needs Assistance Bed Mobility: Supine to Sit     Supine to sit: Supervision, HOB elevated          Transfers Overall transfer level: Needs assistance Equipment used: Rolling walker (2 wheels) Transfers: Sit to/from Stand Sit to Stand: Min assist           General transfer comment: Cues for hand placement and L LE management; light min A to steady    Ambulation/Gait Ambulation/Gait assistance: Contact guard assist Gait Distance (Feet): 80 Feet Assistive device: Rolling walker (2 wheels) Gait Pattern/deviations: Step-to pattern, Decreased stride length, Decreased weight shift to left Gait velocity:  decreased but functional     General Gait Details: Initial cues for sequencing and RW proximity with good carry over   Stairs             Wheelchair Mobility     Tilt Bed    Modified Rankin (Stroke Patients Only)       Balance Overall balance assessment: Needs assistance Sitting-balance support: No upper extremity supported Sitting balance-Leahy Scale: Good     Standing balance support: Bilateral upper extremity supported, Reliant on assistive device for balance Standing balance-Leahy Scale: Poor Standing balance comment: steady wtih RW                            Communication    Cognition Arousal: Alert Behavior During Therapy: WFL for tasks assessed/performed   PT - Cognitive impairments: No apparent impairments                                Cueing    Exercises Total Joint Exercises Ankle Circles/Pumps: AROM, Both, 10 reps, Supine Quad Sets: AROM, Both, 10 reps, Supine Long Arc Quad: AROM, Left, 10 reps, Seated Knee Flexion: AROM, Left, 10 reps, Seated Goniometric ROM: L knee 5 to 80 degrees    General Comments General comments (skin integrity, edema, etc.): RN reports pt had dizziness this morning with head movements in bed but BP were soft but stable, encouraged him to eat, and maintained  IV fluids.  Pt reports feeling much better at PT arrival.  No dizziness or lightheadedness during session.      Pertinent Vitals/Pain Pain Assessment Pain Assessment: No/denies pain    Home Living                          Prior Function            PT Goals (current goals can now be found in the care plan section) Progress towards PT goals: Progressing toward goals    Frequency    7X/week      PT Plan      Co-evaluation              AM-PAC PT "6 Clicks" Mobility   Outcome Measure  Help needed turning from your back to your side while in a flat bed without using bedrails?: A Little Help needed moving from  lying on your back to sitting on the side of a flat bed without using bedrails?: A Little Help needed moving to and from a bed to a chair (including a wheelchair)?: A Little Help needed standing up from a chair using your arms (e.g., wheelchair or bedside chair)?: A Little Help needed to walk in hospital room?: A Little Help needed climbing 3-5 steps with a railing? : A Little 6 Click Score: 18    End of Session Equipment Utilized During Treatment: Gait belt Activity Tolerance: Patient tolerated treatment well Patient left: with call bell/phone within reach;with chair alarm set;in chair Nurse Communication: Mobility status PT Visit Diagnosis: Muscle weakness (generalized) (M62.81);Other abnormalities of gait and mobility (R26.89)     Time: 5784-6962 PT Time Calculation (min) (ACUTE ONLY): 27 min  Charges:    $Gait Training: 8-22 mins $Therapeutic Exercise: 8-22 mins PT General Charges $$ ACUTE PT VISIT: 1 Visit                     Anise Salvo, PT Acute Rehab Services [facility] [phone]    Kirk [name] 11/06/2023, 1:21 PM

## 2023-11-06 NOTE — Plan of Care (Signed)

## 2023-11-06 NOTE — TOC Transition Note (Signed)
 Transition of Care [facility]) - Discharge Note   Patient Details  Name: Kirk [name] MRN: [id number] Date of Birth: Dec 11, 1945  [facility] CM/SW Contact:  Amada Jupiter, LCSW Phone Number: 11/06/2023, 10:06 AM   Clinical Narrative:     Met with pt who confirms he has needed DME in the home.  OPPT already arranged with [facility] Gunner).  No further TOC needs.  Final next level of care: OP Rehab Barriers to Discharge: No Barriers Identified   Patient Goals and CMS Choice Patient states their goals for this hospitalization and ongoing recovery are:: return home          Discharge Placement                       Discharge Plan and Services Additional resources added to the After Visit Summary for                  DME Arranged: N/A DME Agency: NA                  Social Drivers of Health (SDOH) Interventions SDOH Screenings   Food Insecurity: No Food Insecurity (11/05/2023)  Housing: Low Risk  (11/05/2023)  Transportation Needs: No Transportation Needs (11/05/2023)  Utilities: Not At Risk (11/05/2023)  Alcohol Screen: Low Risk  (02/06/2023)  Depression (PHQ2-9): Low Risk  (10/10/2023)  Financial Resource Strain: Low Risk  (02/06/2023)  Physical Activity: Sufficiently Active (02/06/2023)  Social Connections: Socially Integrated (11/05/2023)  Stress: No Stress Concern Present (02/06/2023)  Tobacco Use: Low Risk  (11/05/2023)  Health Literacy: Adequate Health Literacy (06/06/2023)     Readmission Risk Interventions     No data to display

## 2023-11-06 NOTE — Progress Notes (Signed)
 Physical Therapy Treatment Patient Details Name: Kirk [name] MRN: [id number] DOB: 03-03-1946 Today's Date: 11/06/2023   History of Present Illness R knee Pt is 78 yo male s/p L TKA on 11/05/23.  Pt with hx including but not limited to arthritis, R LE DVT, CKD, DM2, PE, anal CA, afib, CABG, MI, seizure, cervical fusion, R TKA 10/24    PT Comments  Pt is POD # 1 and is progressing well.  He was able to ambulate 100' and performed stairs similar to home set up.  Pt has excellent pain control, ROM, quad activation , and understanding of HEP.  Pt demonstrates safe gait & transfers in order to return home from PT perspective once discharged by MD.  While in hospital, will continue to benefit from PT for skilled therapy to advance mobility and exercises.       If plan is discharge home, recommend the following: A little help with walking and/or transfers;A little help with bathing/dressing/bathroom;Help with stairs or ramp for entrance;Assistance with cooking/housework   Can travel by private vehicle        Equipment Recommendations  None recommended by PT    Recommendations for Other Services       Precautions / Restrictions Precautions Precautions: Fall Required Braces or Orthoses: Knee Immobilizer - Left Knee Immobilizer - Left: Discontinue once straight leg raise with < 10 degree lag Restrictions LLE Weight Bearing Per Provider Order: Weight bearing as tolerated     Mobility  Bed Mobility Overal bed mobility: Needs Assistance Bed Mobility: Supine to Sit     Supine to sit: Supervision, HOB elevated     General bed mobility comments: in chair    Transfers Overall transfer level: Needs assistance Equipment used: Rolling walker (2 wheels) Transfers: Sit to/from Stand Sit to Stand: Supervision           General transfer comment: Performed without difficulty    Ambulation/Gait Ambulation/Gait assistance: Supervision Gait Distance (Feet): 100 Feet Assistive device:  Standard walker Gait Pattern/deviations: Step-to pattern, Decreased stride length, Decreased weight shift to left Gait velocity: decreased but functional     General Gait Details: Good carry over of RW proximity and sequencing from morning; steady with RW   Stairs Stairs: Yes Stairs assistance: Contact guard assist Stair Management: Two rails, Step to pattern, Forwards Number of Stairs: 5 General stair comments: educated on sequencing; performed without difficulty   Wheelchair Mobility     Tilt Bed    Modified Rankin (Stroke Patients Only)       Balance Overall balance assessment: Needs assistance Sitting-balance support: No upper extremity supported Sitting balance-Leahy Scale: Good     Standing balance support: Bilateral upper extremity supported, No upper extremity supported Standing balance-Leahy Scale: Fair Standing balance comment: RW to ambulate but could stand without UE support                            Communication    Cognition Arousal: Alert Behavior During Therapy: WFL for tasks assessed/performed   PT - Cognitive impairments: No apparent impairments                                Cueing    Exercises Total Joint Exercises Ankle Circles/Pumps: AROM, Both, 10 reps, Supine Quad Sets: AROM, Both, 10 reps, Supine Heel Slides: AAROM, Left, 10 reps, Supine Hip ABduction/ADduction: AAROM, Left, 10 reps, Supine Long Arc  Quad: AROM, Left, 10 reps, Seated Knee Flexion: AROM, Left, 10 reps, Seated Goniometric ROM: L knee 5 to 80 degrees Other Exercises Other Exercises: Eductaed on AAROM techniques with gait belt and ROM to tolerance only    General Comments General comments (skin integrity, edema, etc.): No dizziness/lightheadedness  Educated on safe ice use, no pivots, car transfers, resting with leg straight, and TED hose during day. Also, encouraged walking every 1-2 hours during day. Educated on HEP with focus on mobility the  first weeks. Discussed doing exercises within pain control and if pain increasing could decreased ROM, reps, and stop exercises as needed. Encouraged to perform quad sets and ankle pumps frequently for blood flow and to promote full knee extension.       Pertinent Vitals/Pain Pain Assessment Pain Assessment: 0-10 Pain Score: 4  Pain Location: L knee Pain Descriptors / Indicators: Sore Pain Intervention(s): Limited activity within patient's tolerance, Monitored during session, Premedicated before session, Repositioned, Ice applied    Home Living                          Prior Function            PT Goals (current goals can now be found in the care plan section) Progress towards PT goals: Progressing toward goals    Frequency    7X/week      PT Plan      Co-evaluation              AM-PAC PT "6 Clicks" Mobility   Outcome Measure  Help needed turning from your back to your side while in a flat bed without using bedrails?: A Little Help needed moving from lying on your back to sitting on the side of a flat bed without using bedrails?: A Little Help needed moving to and from a bed to a chair (including a wheelchair)?: A Little Help needed standing up from a chair using your arms (e.g., wheelchair or bedside chair)?: A Little Help needed to walk in hospital room?: A Little Help needed climbing 3-5 steps with a railing? : A Little 6 Click Score: 18    End of Session Equipment Utilized During Treatment: Gait belt Activity Tolerance: Patient tolerated treatment well Patient left: with call bell/phone within reach;with chair alarm set;in chair Nurse Communication: Mobility status PT Visit Diagnosis: Muscle weakness (generalized) (M62.81);Other abnormalities of gait and mobility (R26.89)     Time: 1610-9604 PT Time Calculation (min) (ACUTE ONLY): 40 min  Charges:    $Gait Training: 8-22 mins $Therapeutic Exercise: 8-22 mins $Therapeutic Activity: 8-22  mins PT General Charges $$ ACUTE PT VISIT: 1 Visit                     Anise Salvo, PT Acute Rehab Services [facility] [phone]    Rayetta Humphrey 11/06/2023, 3:08 PM

## 2023-11-06 NOTE — Progress Notes (Signed)
 Subjective: 1 Day Post-Op Procedure(s) (LRB): ARTHROPLASTY, KNEE, TOTAL (Left) Patient reports pain as mild.   Patient seen in rounds by Dr. Lequita Halt. Patient is well, and has had no acute complaints or problems No issues overnight. Denies chest pain, SOB, or calf pain. Foley catheter removed this AM.  We will continue therapy today  Objective: Vital signs in last 24 hours: Temp:  [97.5 F (36.4 C)-98.4 F (36.9 C)] 97.5 F (36.4 C) (03/04 0606) Pulse Rate:  [51-97] 58 (03/04 0748) Resp:  [16-20] 16 (03/04 0606) BP: (81-132)/(44-97) 98/62 (03/04 0748) SpO2:  [86 %-100 %] 94 % (03/04 0748) Weight:  [122.7 kg] 122.7 kg (03/03 1145)  Intake/Output from previous day:  Intake/Output Summary (Last 24 hours) at 11/06/2023 0847 Last data filed at 11/06/2023 0545 Gross per 24 hour  Intake 2809.87 ml  Output 3425 ml  Net -615.13 ml     Intake/Output this shift: No intake/output data recorded.  Labs: Recent Labs    11/06/23 0334  HGB 12.3*   Recent Labs    11/06/23 0334  WBC 14.8*  RBC 3.96*  HCT 38.3*  PLT 143*   Recent Labs    11/06/23 0334  NA 134*  K 4.4  CL 99  CO2 24  BUN 19  CREATININE 1.08  GLUCOSE 157*  CALCIUM 8.5*   No results for input(s): "LABPT", "INR" in the last 72 hours.  Exam: General - Patient is Alert and Oriented Extremity - Neurologically intact Neurovascular intact Sensation intact distally Dorsiflexion/Plantar flexion intact Dressing - dressing C/D/I Motor Function - intact, moving foot and toes well on exam.   Past Medical History:  Diagnosis Date   Allergy    Anal cancer (HCC) 04/2012   Aortic stenosis    valve replacement   Arthritis    1995- cerv. 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   CHF (congestive heart failure) (HCC)    Coronary artery disease    aortic  stenosis, CAD   Diabetes mellitus    Type 2   DVT (deep venous thrombosis) (HCC)    ED (erectile dysfunction)    Elevated cholesterol    Heart murmur    no problems   History of kidney stones    passed, lithrotrispy and surgery   History of radiation therapy 05/20/12-06/28/12   anal cancer=54gy total dose   Hypertension    Hypothyroidism    Kidney stones    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   Malignant neoplasm of other sites of rectum, rectosigmoid junction, and anus 04/12/2012   Myocardial infarction (HCC) 2012   Personal history of colonic polyps 03/20/2012   Pneumonia    Pulmonary embolism (HCC)    Rectal bleeding    Rectal mass 03/20/2012   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   #37mm [facility] Ease pericardial tissue valve    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   Weight as of this encounter: 122.7 kg. Advance diet Up with therapy D/C IV fluids   Patient's anticipated LOS is less than 2 midnights, meeting these requirements: - Lives within 1 hour of care - Has a competent adult at home to recover with post-op recover - NO history of  - Chronic pain requiring opiods  - Diabetes  - Coronary Artery Disease  - Heart failure  - Heart attack  - Stroke  - Respiratory Failure/COPD  - Renal failure  - Anemia  - Advanced Liver disease  DVT Prophylaxis -  Eliquis Weight bearing as tolerated. Continue therapy.  Plan is to go Home after hospital  stay. Plan for discharge later today if progresses with therapy and meeting goals. Scheduled for OPPT at [facility]). Follow-up in the office in 2 weeks.  The PDMP database was reviewed today prior to any opioid medications being prescribed to this patient.  Arther Abbott, PA-C Orthopedic Surgery [phone] 11/06/2023, 8:47 AM

## 2023-11-06 NOTE — Care Management Obs Status (Signed)
 MEDICARE OBSERVATION STATUS NOTIFICATION   Patient Details  Name: Kirk [name] MRN: [id number] Date of Birth: [birth year]   Medicare Observation Status Notification Given:  Yes    Amada Jupiter, LCSW 11/06/2023, 10:06 AM

## 2023-11-07 NOTE — Discharge Summary (Signed)
 Patient ID: Kirk [name] MRN: [id number] DOB/AGE: 1945/09/13 78 y.o.  Admit date: 11/05/2023 Discharge date: 11/06/2023  Admission Diagnoses:  Principal Problem:   Osteoarthritis of knee Active Problems:   Osteoarthritis of left knee   Discharge Diagnoses:  Same  Past Medical History:  Diagnosis Date   Allergy    Anal cancer (HCC) 04/2012   Aortic stenosis    valve replacement   Arthritis    1995- cerv. 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   CHF (congestive heart failure) (HCC)    Coronary artery disease    aortic stenosis, CAD   Diabetes mellitus    Type 2   DVT (deep venous thrombosis) (HCC)    ED (erectile dysfunction)    Elevated cholesterol    Heart murmur    no problems   History of kidney stones    passed, lithrotrispy and surgery   History of radiation therapy 05/20/12-06/28/12   anal cancer=54gy total dose   Hypertension    Hypothyroidism    Kidney stones    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   Malignant neoplasm of other sites of rectum, rectosigmoid junction, and anus 04/12/2012   Myocardial infarction (HCC) 2012   Personal history of colonic polyps 03/20/2012   Pneumonia    Pulmonary embolism (HCC)    Rectal bleeding    Rectal mass 03/20/2012   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   #88mm [facility] Ease pericardial tissue valve    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 male who was admitted 11/05/2023 for operative treatment  ofOsteoarthritis of knee. Patient has severe unremitting pain that affects sleep, daily activities, and work/hobbies. After pre-op clearance the patient was taken to the operating room on 11/05/2023 and underwent  Procedure(s): ARTHROPLASTY, KNEE, TOTAL.    Patient was given perioperative antibiotics:  Anti-infectives (From admission, onward)    Start     Dose/Rate Route Frequency Ordered Stop   11/05/23 1400  ceFAZolin (ANCEF) IVPB 2g/100 mL premix        2 g 200 mL/hr over 30 Minutes Intravenous Every 6 hours 11/05/23 1149 11/05/23 2030   11/05/23 0615  ceFAZolin (ANCEF) IVPB 2g/100 mL premix        2 g 200 mL/hr over 30 Minutes Intravenous On call to O.R. 11/05/23 4098 11/05/23 0805        Patient was given sequential compression devices, early ambulation, and chemoprophylaxis to prevent DVT.  Patient benefited maximally from hospital stay and there were no complications.    Recent vital signs: Patient Vitals for the past 24 hrs:  BP Temp Temp src Pulse Resp SpO2  11/06/23 1315 (!) 108/56 98 F (36.7 C) Oral 67 16 98 %     Recent laboratory studies:  Recent Labs    11/06/23 0334  WBC 14.8*  HGB 12.3*  HCT 38.3*  PLT 143*  NA 134*  K 4.4  CL 99  CO2 24  BUN 19  CREATININE 1.08  GLUCOSE 157*  CALCIUM 8.5*     Discharge Medications:   Allergies as of 11/06/2023       Reactions   Cat Dander Shortness Of Breath, Swelling   Hydrocodone Other (See Comments)   Seizures    Atorvastatin Other (See Comments)   Joint pain   Dust Mite Extract Other (See Comments), Swelling   Metformin Other (See Comments)   Diarrhea, muscle cramps   Oxycodone Other (See Comments)   Seizure PATIENT DOES NOT WANT ANY NARCOTICS   Semaglutide(0.25 Or 0.5mg -dos) Nausea Only   AT HIGHER DOSE        Medication List     STOP taking these medications    AMBULATORY NON FORMULARY MEDICATION   ciprofloxacin 500 MG tablet Commonly known as: CIPRO       TAKE these medications     aspirin EC 81 MG tablet Take 81 mg by mouth daily.   DULoxetine 60 MG capsule Commonly known as: CYMBALTA Take 1 capsule (60 mg total) by mouth at bedtime.   Eliquis 2.5 MG Tabs tablet Generic drug: apixaban Take 1 tablet by mouth twice daily   Farxiga 5 MG Tabs tablet Generic drug: dapagliflozin propanediol Take 1 tablet (5 mg total) by mouth daily.   HYDROmorphone 2 MG tablet Commonly known as: DILAUDID Take 0.5-1 tablets (1-2 mg total) by mouth every 8 (eight) hours as needed for severe pain (pain score 7-10).   levothyroxine 25 MCG tablet Commonly known as: SYNTHROID Take 1 tablet (25 mcg total) by mouth daily.   Lipo-Flavonoid Plus Tabs Take 1 tablet by mouth daily.   losartan 50 MG tablet Commonly known as: COZAAR Take 1 tablet by mouth once daily   methocarbamol 500 MG tablet Commonly known as: ROBAXIN Take 1 tablet (500 mg total) by mouth every 6 (six) hours as needed for muscle spasms.   metoprolol tartrate 25 MG tablet Commonly known as: LOPRESSOR TAKE 1 TABLET BY MOUTH TWICE A DAY   Myrbetriq 25 MG Tb24 tablet Generic drug: mirabegron ER Take 1 tablet (25 mg total) by mouth daily.   NEEDLE (DISP) 22 G 22G X 1-1/2" Misc 1 each by Does not apply route once a week. Use to administer medication.   ondansetron 4 MG tablet Commonly known as: ZOFRAN Take 1 tablet (4 mg total) by mouth every 6 (six) hours as needed for nausea.   Ozempic (0.25 or 0.5 MG/DOSE) 2 MG/3ML Sopn Generic drug: Semaglutide(0.25 or 0.5MG /DOS) INJECT 0.25 MG SUBCUTANEOUSLY ONCE A WEEK   rosuvastatin 40 MG tablet Commonly known as: CRESTOR TAKE 1 TABLET BY MOUTH AT BEDTIME   traMADol 50 MG tablet Commonly known as: Ultram Take 1-2 tablets (50-100 mg total) by mouth every 6 (six) hours as needed for moderate pain (pain score 4-6).               Discharge Care Instructions  (From admission, onward)           Start     Ordered   11/06/23 0000  Weight bearing as  tolerated        11/06/23 0851   11/06/23 0000  Change dressing       Comments: You may remove the bulky bandage (ACE wrap and gauze) two days after surgery. You will have an adhesive waterproof bandage underneath. Leave this in place until your first follow-up appointment.   11/06/23 7829  Diagnostic Studies: No results found.  Disposition: Discharge disposition: 01-Home or Self Care       Discharge Instructions     Call MD / Call 911   Complete by: As directed    If you experience chest pain or shortness of breath, CALL 911 and be transported to the hospital emergency room.  If you develope a fever above 101 F, pus (white drainage) or increased drainage or redness at the wound, or calf pain, call your surgeon's office.   Change dressing   Complete by: As directed    You may remove the bulky bandage (ACE wrap and gauze) two days after surgery. You will have an adhesive waterproof bandage underneath. Leave this in place until your first follow-up appointment.   Constipation Prevention   Complete by: As directed    Drink plenty of fluids.  Prune juice may be helpful.  You may use a stool softener, such as Colace (over the counter) 100 mg twice a day.  Use MiraLax (over the counter) for constipation as needed.   Diet - low sodium heart healthy   Complete by: As directed    Do not put a pillow under the knee. Place it under the heel.   Complete by: As directed    Driving restrictions   Complete by: As directed    No driving for two weeks   Post-operative opioid taper instructions:   Complete by: As directed    POST-OPERATIVE OPIOID TAPER INSTRUCTIONS: It is important to wean off of your opioid medication as soon as possible. If you do not need pain medication after your surgery it is ok to stop day one. Opioids include: Codeine, Hydrocodone(Norco, Vicodin), Oxycodone(Percocet, oxycontin) and hydromorphone amongst others.  Long term and even short term use of opiods  can cause: Increased pain response Dependence Constipation Depression Respiratory depression And more.  Withdrawal symptoms can include Flu like symptoms Nausea, vomiting And more Techniques to manage these symptoms Hydrate well Eat regular healthy meals Stay active Use relaxation techniques(deep breathing, meditating, yoga) Do Not substitute Alcohol to help with tapering If you have been on opioids for less than two weeks and do not have pain than it is ok to stop all together.  Plan to wean off of opioids This plan should start within one week post op of your joint replacement. Maintain the same interval or time between taking each dose and first decrease the dose.  Cut the total daily intake of opioids by one tablet each day Next start to increase the time between doses. The last dose that should be eliminated is the evening dose.      TED hose   Complete by: As directed    Use stockings (TED hose) for three weeks on both leg(s).  You may remove them at night for sleeping.   Weight bearing as tolerated   Complete by: As directed         Follow-up Information     Aluisio, Homero Fellers, MD Follow up in 2 week(s).   Specialty: Orthopedic Surgery Contact information: [address] [place] [address] [phone]                  Signed: Arther Abbott 11/07/2023, 10:12 AM

## 2023-11-13 DIAGNOSIS — M6281 Muscle weakness (generalized): Secondary | ICD-10-CM | POA: Diagnosis not present

## 2023-11-13 DIAGNOSIS — M25562 Pain in left knee: Secondary | ICD-10-CM | POA: Diagnosis not present

## 2023-11-13 DIAGNOSIS — M25662 Stiffness of left knee, not elsewhere classified: Secondary | ICD-10-CM | POA: Diagnosis not present

## 2023-11-16 DIAGNOSIS — M25562 Pain in left knee: Secondary | ICD-10-CM | POA: Diagnosis not present

## 2023-11-16 DIAGNOSIS — M6281 Muscle weakness (generalized): Secondary | ICD-10-CM | POA: Diagnosis not present

## 2023-11-16 DIAGNOSIS — M25662 Stiffness of left knee, not elsewhere classified: Secondary | ICD-10-CM | POA: Diagnosis not present

## 2023-11-19 DIAGNOSIS — M25562 Pain in left knee: Secondary | ICD-10-CM | POA: Diagnosis not present

## 2023-11-19 DIAGNOSIS — M6281 Muscle weakness (generalized): Secondary | ICD-10-CM | POA: Diagnosis not present

## 2023-11-19 DIAGNOSIS — M25662 Stiffness of left knee, not elsewhere classified: Secondary | ICD-10-CM | POA: Diagnosis not present

## 2023-11-21 DIAGNOSIS — M6281 Muscle weakness (generalized): Secondary | ICD-10-CM | POA: Diagnosis not present

## 2023-11-21 DIAGNOSIS — M25662 Stiffness of left knee, not elsewhere classified: Secondary | ICD-10-CM | POA: Diagnosis not present

## 2023-11-21 DIAGNOSIS — M25562 Pain in left knee: Secondary | ICD-10-CM | POA: Diagnosis not present

## 2023-11-23 DIAGNOSIS — M25662 Stiffness of left knee, not elsewhere classified: Secondary | ICD-10-CM | POA: Diagnosis not present

## 2023-11-23 DIAGNOSIS — M25562 Pain in left knee: Secondary | ICD-10-CM | POA: Diagnosis not present

## 2023-11-23 DIAGNOSIS — M6281 Muscle weakness (generalized): Secondary | ICD-10-CM | POA: Diagnosis not present

## 2023-11-26 DIAGNOSIS — M6281 Muscle weakness (generalized): Secondary | ICD-10-CM | POA: Diagnosis not present

## 2023-11-26 DIAGNOSIS — M25662 Stiffness of left knee, not elsewhere classified: Secondary | ICD-10-CM | POA: Diagnosis not present

## 2023-11-26 DIAGNOSIS — M25562 Pain in left knee: Secondary | ICD-10-CM | POA: Diagnosis not present

## 2023-11-27 ENCOUNTER — Other Ambulatory Visit (HOSPITAL_BASED_OUTPATIENT_CLINIC_OR_DEPARTMENT_OTHER): Payer: Self-pay | Admitting: Family Medicine

## 2023-11-27 DIAGNOSIS — E1142 Type 2 diabetes mellitus with diabetic polyneuropathy: Secondary | ICD-10-CM

## 2023-11-28 DIAGNOSIS — M25562 Pain in left knee: Secondary | ICD-10-CM | POA: Diagnosis not present

## 2023-11-28 DIAGNOSIS — M25662 Stiffness of left knee, not elsewhere classified: Secondary | ICD-10-CM | POA: Diagnosis not present

## 2023-11-28 DIAGNOSIS — M6281 Muscle weakness (generalized): Secondary | ICD-10-CM | POA: Diagnosis not present

## 2023-11-30 DIAGNOSIS — M25662 Stiffness of left knee, not elsewhere classified: Secondary | ICD-10-CM | POA: Diagnosis not present

## 2023-11-30 DIAGNOSIS — M25562 Pain in left knee: Secondary | ICD-10-CM | POA: Diagnosis not present

## 2023-11-30 DIAGNOSIS — M6281 Muscle weakness (generalized): Secondary | ICD-10-CM | POA: Diagnosis not present

## 2023-12-03 DIAGNOSIS — M25562 Pain in left knee: Secondary | ICD-10-CM | POA: Diagnosis not present

## 2023-12-03 DIAGNOSIS — M6281 Muscle weakness (generalized): Secondary | ICD-10-CM | POA: Diagnosis not present

## 2023-12-03 DIAGNOSIS — M25662 Stiffness of left knee, not elsewhere classified: Secondary | ICD-10-CM | POA: Diagnosis not present

## 2023-12-04 DIAGNOSIS — Z5189 Encounter for other specified aftercare: Secondary | ICD-10-CM | POA: Diagnosis not present

## 2023-12-06 DIAGNOSIS — M25662 Stiffness of left knee, not elsewhere classified: Secondary | ICD-10-CM | POA: Diagnosis not present

## 2023-12-06 DIAGNOSIS — M25562 Pain in left knee: Secondary | ICD-10-CM | POA: Diagnosis not present

## 2023-12-06 DIAGNOSIS — M6281 Muscle weakness (generalized): Secondary | ICD-10-CM | POA: Diagnosis not present

## 2023-12-10 DIAGNOSIS — M6281 Muscle weakness (generalized): Secondary | ICD-10-CM | POA: Diagnosis not present

## 2023-12-10 DIAGNOSIS — M25562 Pain in left knee: Secondary | ICD-10-CM | POA: Diagnosis not present

## 2023-12-10 DIAGNOSIS — M25662 Stiffness of left knee, not elsewhere classified: Secondary | ICD-10-CM | POA: Diagnosis not present

## 2023-12-13 DIAGNOSIS — M25662 Stiffness of left knee, not elsewhere classified: Secondary | ICD-10-CM | POA: Diagnosis not present

## 2023-12-13 DIAGNOSIS — M6281 Muscle weakness (generalized): Secondary | ICD-10-CM | POA: Diagnosis not present

## 2023-12-13 DIAGNOSIS — M25562 Pain in left knee: Secondary | ICD-10-CM | POA: Diagnosis not present

## 2023-12-25 ENCOUNTER — Inpatient Hospital Stay: Payer: Medicare Other | Attending: Oncology | Admitting: Oncology

## 2023-12-25 VITALS — BP 105/48 | HR 82 | Temp 98.2°F | Resp 18 | Ht 71.0 in | Wt 219.2 lb

## 2023-12-25 DIAGNOSIS — Z7901 Long term (current) use of anticoagulants: Secondary | ICD-10-CM | POA: Insufficient documentation

## 2023-12-25 DIAGNOSIS — C211 Malignant neoplasm of anal canal: Secondary | ICD-10-CM | POA: Diagnosis not present

## 2023-12-25 DIAGNOSIS — Z86711 Personal history of pulmonary embolism: Secondary | ICD-10-CM | POA: Insufficient documentation

## 2023-12-25 DIAGNOSIS — Z85048 Personal history of other malignant neoplasm of rectum, rectosigmoid junction, and anus: Secondary | ICD-10-CM | POA: Diagnosis not present

## 2023-12-25 DIAGNOSIS — Z86718 Personal history of other venous thrombosis and embolism: Secondary | ICD-10-CM | POA: Insufficient documentation

## 2023-12-25 NOTE — Progress Notes (Signed)
 [facility] OFFICE PROGRESS NOTE   Diagnosis: Anal cancer  INTERVAL HISTORY:   Kirk [name] returns as scheduled.  He feels well.  No difficulty with bowel function.  No bleeding.  He continues apixaban  anticoagulation.  No symptom of recurrent thrombosis.  He underwent knee replacement surgery in October 2024 and in March 2025.  He has recovered from surgery.  He has recently experienced episodes of positional vertigo.    Objective:  Vital signs in last 24 hours:  Blood pressure (!) 105/48, pulse 82, temperature 98.2 F (36.8 C), temperature source Temporal, resp. rate 18, height 5\' 11"  (1.803 m), weight 219 lb 3.2 oz (99.4 kg), SpO2 98%.    Lymphatics: No cervical, supraclavicular, axillary, or inguinal nodes Resp: Lungs clear bilaterally Cardio: Regular rate and rhythm GI: No hepatosplenomegaly, no mass Rectal: No anal or rectal mass, brown stool Vascular: No leg edema  Lab Results:  Lab Results  Component Value Date   WBC 14.8 (H) 11/06/2023   HGB 12.3 (L) 11/06/2023   HCT 38.3 (L) 11/06/2023   MCV 96.7 11/06/2023   PLT 143 (L) 11/06/2023   NEUTROABS 3.6 08/21/2023    CMP  Lab Results  Component Value Date   NA 134 (L) 11/06/2023   K 4.4 11/06/2023   CL 99 11/06/2023   CO2 24 11/06/2023   GLUCOSE 157 (H) 11/06/2023   BUN 19 11/06/2023   CREATININE 1.08 11/06/2023   CALCIUM  8.5 (L) 11/06/2023   PROT 6.9 08/21/2023   ALBUMIN  4.1 08/21/2023   AST 25 08/21/2023   ALT 27 08/21/2023   ALKPHOS 97 08/21/2023   BILITOT 0.3 08/21/2023   GFRNONAA >60 11/06/2023   GFRAA >60 01/08/2018    Medications: I have reviewed the patient's current medications.   Assessment/Plan: Squamous cell carcinoma of the anal canal, HPV positive.   - Staging CT/PET scan with tiny lung nodules, hypermetabolic retroperitoneal, right inguinal, and left supraclavicular nodes . FNA biopsy of a left supraclavicular lymph node on 05/03/2012 confirmed metastatic squamous cell  carcinoma.   -Cycle 1 of 5-fluorouracil /mitomycin -C and concurrent radiation on 05/20/2012. Cycle 2 of 5-fluorouracil /mitomycin -C on 06/24/2012 with completion of radiation on 06/28/2012.   -Restaging PET scan 08/02/2012 with resolution of the hypermetabolic anal primary and right inguinal lymph node. Persistent hypermetabolic supraclavicular and retroperitoneal nodes.   -cycle 1 of salvage Taxol /carboplatin  chemotherapy on 08/30/2012 . Restaging CTs after 4 cycles of Taxol /carboplatin  confirmed resolution of retroperitoneal lymphadenopathy and a decrease in the size of a previously noted left supraclavicular node. He completed cycle 6 of Taxol /carboplatin  on 01/02/2013.   -A negative flexible sigmoidoscopy 04/01/2013   -Staging CT scans 10/09/2013 with stable small left supraclavicular node, no evidence of progressive metastatic disease   -Negative colonoscopy 03/19/2015 -Colonoscopy 05/02/2018-no evidence of recurrent anal cancer, multiple tubular adenomas removed from the colon -Colonoscopy 06/30/2019-no evidence of recurrent anal cancer, multiple tubular adenomas removed -Colonoscopy 01/01/2023-polyps removed-no adenomatous 3. Hypermetabolic activity at the right supraglottic larynx on the PET scan 04/17/2012 with associated soft tissue fullness status post a negative ENT evaluation by Dr. Donalee Fruits.   4. History of aortic stenosis-status post aortic valve replacement and coronary artery bypass surgery in November 2012.   5. Mucositis following cycle 1 chemotherapy. Resolved.   6. Skin breakdown at the perineum and penis secondary to chemotherapy and radiation. Resolved.   7. 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 Bone pain after Neulasta    9. Generalized seizure 09/24/2012-negative brain CT,? Etiology. He has been evaluated  by neurology and was prescribed Vimpat .   10.history of Thrombocytopenia secondary chemotherapy  11. Peripheral neuropathy-predating chemotherapy   12. Erectile  dysfunction-followed by Dr. Bosie Bye 13.  Right leg DVT 03/28/2022  CT chest 04/25/2022-nonocclusive filling defect of the right common femoral vein, acute saddle pulmonary embolus of the main pulmonary artery, left and right pulmonary arteries, and bilateral lateral lobar arteries Admission 04/26/2022-heparin  anticoagulation, discharged on apixaban         Disposition: Kirk [name] in clinical remission from anal cancer.  He will return for an office visit in 9 months.  He continues apixaban  anticoagulation after being diagnosed with a right lower extremity DVT and pulmonary embolism in 2023.  He will continue colonoscopy surveillance with Dr. General Kenner.  Recommended he follow-up with Dr. [name] to evaluate the vertigo.  Coni Deep, MD  12/25/2023  12:38 PM

## 2023-12-26 ENCOUNTER — Telehealth: Payer: Self-pay | Admitting: Oncology

## 2023-12-26 NOTE — Telephone Encounter (Signed)
 Patient has been scheduled for follow-up visit per 12/25/23 LOS.  Pt aware of scheduled appt details.

## 2023-12-30 ENCOUNTER — Other Ambulatory Visit (HOSPITAL_BASED_OUTPATIENT_CLINIC_OR_DEPARTMENT_OTHER): Payer: Self-pay | Admitting: Family Medicine

## 2023-12-31 ENCOUNTER — Other Ambulatory Visit: Payer: Self-pay

## 2023-12-31 DIAGNOSIS — I824Z1 Acute embolism and thrombosis of unspecified deep veins of right distal lower extremity: Secondary | ICD-10-CM

## 2023-12-31 MED ORDER — APIXABAN 2.5 MG PO TABS: 2.5000 mg | ORAL_TABLET | Freq: Two times a day (BID) | ORAL | 0 refills | Status: DC

## 2024-01-17 ENCOUNTER — Other Ambulatory Visit (HOSPITAL_BASED_OUTPATIENT_CLINIC_OR_DEPARTMENT_OTHER): Payer: Self-pay | Admitting: Family Medicine

## 2024-01-17 DIAGNOSIS — E1142 Type 2 diabetes mellitus with diabetic polyneuropathy: Secondary | ICD-10-CM

## 2024-01-19 ENCOUNTER — Other Ambulatory Visit (HOSPITAL_BASED_OUTPATIENT_CLINIC_OR_DEPARTMENT_OTHER): Payer: Self-pay | Admitting: Family Medicine

## 2024-01-19 DIAGNOSIS — I10 Essential (primary) hypertension: Secondary | ICD-10-CM

## 2024-01-25 ENCOUNTER — Ambulatory Visit: Payer: Self-pay

## 2024-01-25 NOTE — Telephone Encounter (Signed)
 Called and spoke with pt's spouse seeing if they could possibly come in 6/2 at 3:50 and she said that would work. Routing to [place] to help me add patient to the schedule as an override needs to be done.

## 2024-01-25 NOTE — Telephone Encounter (Signed)
 Chief Complaint: dizziness Frequency: since March  Pertinent Negatives: Patient denies fever, CP, SOB, palpitations, one-sided weakness, H/A, vomiting, falls Disposition: [] ED /[] Urgent Care (no appt availability in office) / [x] Appointment(In office/virtual)/ []  [facility]/ [] Home Care/ [x] Refused Recommended Disposition /[] [place] [facility]/ []  Follow-up with PCP Additional Notes: RN spoke to pt's wife. Wife states pt has been experiencing dizziness since March. Episodes of dizziness are intermittent and exacerbated by the pt turning his head from side to side. Wife states a different doctor advised them this may be vertigo. Pt has not fallen. Wife states episodes of dizziness last 30 seconds. In that time pt stops what he is doing and waits until the dizziness passes. Wife denies H/A, CP, SOB, vomiting, one-sided weakness to face or body. However, wife does endorse that the pt complains about his L ear hurting him occasionally and that the pt also reports numbness around the ear/mandible. Wife denies that pt has other left-sided numbness/weakness to the face or the body. Pt has HTN, takes all his meds w/ no missed doses. Denies blurry vision. Pt has hx of Dvt and PE, pt denies CP, SOB, palpitations. Pt had a knee replacement last October and last March. RN advised wife pt needs to be seen within 24 hours. Wife stated the pt told her earlier today that he will not come into the office today or see any provider today. RN offered wife a same-day appt for today at the office with another provider, wife states pt only wants to see Dr. [name], and reiterated that the pt will not come in today anyways. Eddie Good Peru has no availability til March. RN advised pt RN would relay pt's symptoms to the office and the need for an appt. RN advised in the meantime if the pt becomes more dizzy, falls, has one-sided weakness/numbness, severe H/A, vomiting, visual changes, CP, SOB or any worsening 911 must be  called . Wife verbalized understanding.     Copied from CRM [id number]. Topic: Clinical - Red Word Triage >> Jan 25, 2024  9:42 AM Oddis Bench wrote: Red Word that prompted transfer to Nurse Triage: Patient is calling he is having dizziness since March, has not fallen. Reason for Disposition  [1] NO dizziness now AND [2] one or more stroke risk factors (i.e., hypertension, diabetes, prior stroke/TIA/heart attack)  Answer Assessment - Initial Assessment Questions 1. DESCRIPTION: "Describe your dizziness."     Dizziness with turning his head, wife states a previous doctor states it may be vertigo  2. VERTIGO: "Do you feel like either you or the room is spinning or tilting?"      yes 3. LIGHTHEADED: "Do you feel lightheaded?" (e.g., somewhat faint, woozy, weak upon standing)     No  4. SEVERITY: "How bad is it?"  "Can you walk?"   - MILD: Feels slightly dizzy and unsteady, but is walking normally.   - MODERATE: Feels unsteady when walking, but not falling; interferes with normal activities (e.g., school, work).   - SEVERE: Unable to walk without falling, or requires assistance to walk without falling.     Has to stop what he is doing and let it pass, he has not fallen; "just occasional"; lasts 30 seconds 5. ONSET:  "When did the dizziness begin?"     Since March  6. AGGRAVATING FACTORS: "Does anything make it worse?" (e.g., standing, change in head position)     Turning his head from side to side  7. CAUSE: "What do you think  is causing the dizziness?"     Not sure  8. RECURRENT SYMPTOM: "Have you had dizziness before?" If Yes, ask: "When was the last time?" "What happened that time?"     No  9. OTHER SYMPTOMS: "Do you have any other symptoms?" (e.g., headache, weakness, numbness, vomiting, earache)     R knee replacement in October, L knee replacement in March  "He has complained of pain in his left ear occasionally." Denies CP. Denies difficulty breathing "other than allergies." Endorses  numbness around his L ear/mandible. Denies other left-sided numbness/weakness to the rest of his face or body. Denies vomiting.   Hx HTN - taking all meds. Denies blurry vision  Protocols used: Dizziness - Vertigo-A-AH

## 2024-02-04 ENCOUNTER — Encounter (HOSPITAL_BASED_OUTPATIENT_CLINIC_OR_DEPARTMENT_OTHER): Payer: Self-pay | Admitting: Family Medicine

## 2024-02-04 ENCOUNTER — Ambulatory Visit (INDEPENDENT_AMBULATORY_CARE_PROVIDER_SITE_OTHER): Admitting: Family Medicine

## 2024-02-04 VITALS — BP 88/54 | HR 81 | Ht 71.0 in | Wt 218.6 lb

## 2024-02-04 DIAGNOSIS — H9313 Tinnitus, bilateral: Secondary | ICD-10-CM | POA: Insufficient documentation

## 2024-02-04 DIAGNOSIS — M25561 Pain in right knee: Secondary | ICD-10-CM | POA: Insufficient documentation

## 2024-02-04 DIAGNOSIS — I10 Essential (primary) hypertension: Secondary | ICD-10-CM | POA: Diagnosis not present

## 2024-02-04 DIAGNOSIS — Z951 Presence of aortocoronary bypass graft: Secondary | ICD-10-CM | POA: Insufficient documentation

## 2024-02-04 DIAGNOSIS — E291 Testicular hypofunction: Secondary | ICD-10-CM | POA: Insufficient documentation

## 2024-02-04 DIAGNOSIS — E039 Hypothyroidism, unspecified: Secondary | ICD-10-CM

## 2024-02-04 DIAGNOSIS — E1140 Type 2 diabetes mellitus with diabetic neuropathy, unspecified: Secondary | ICD-10-CM | POA: Insufficient documentation

## 2024-02-04 DIAGNOSIS — G629 Polyneuropathy, unspecified: Secondary | ICD-10-CM | POA: Insufficient documentation

## 2024-02-04 DIAGNOSIS — E1142 Type 2 diabetes mellitus with diabetic polyneuropathy: Secondary | ICD-10-CM | POA: Insufficient documentation

## 2024-02-04 DIAGNOSIS — D649 Anemia, unspecified: Secondary | ICD-10-CM | POA: Diagnosis not present

## 2024-02-04 DIAGNOSIS — R0989 Other specified symptoms and signs involving the circulatory and respiratory systems: Secondary | ICD-10-CM | POA: Insufficient documentation

## 2024-02-04 DIAGNOSIS — G4700 Insomnia, unspecified: Secondary | ICD-10-CM | POA: Insufficient documentation

## 2024-02-04 DIAGNOSIS — N3281 Overactive bladder: Secondary | ICD-10-CM | POA: Insufficient documentation

## 2024-02-04 DIAGNOSIS — I129 Hypertensive chronic kidney disease with stage 1 through stage 4 chronic kidney disease, or unspecified chronic kidney disease: Secondary | ICD-10-CM | POA: Insufficient documentation

## 2024-02-04 NOTE — Patient Instructions (Signed)
  Medication Instructions:  Your physician recommends that you continue on your current medications as directed. Please refer to the Current Medication list given to you today. --If you need a refill on any your medications before your next appointment, please call your pharmacy first. If no refills are authorized on file call the office.-- Lab Work: Your physician has recommended that you have lab work today: today If you have labs (blood work) drawn today and your tests are completely normal, you will receive your results via MyChart message OR a phone call from our staff.  Please ensure you check your voicemail in the event that you authorized detailed messages to be left on a delegated number. If you have any lab test that is abnormal or we need to change your treatment, we will call you to review the results.   Follow-Up: Your next appointment:   Your physician recommends that you schedule a follow-up appointment in: as needed with Dr. [name]  You will receive a text message or e-mail with a link to a survey about your care and experience with [place] today! We would greatly appreciate your feedback!   Thanks for letting us be apart of your health journey!!  Primary Care and Sports Medicine   Dr. Ceasar Mons Peru   We encourage you to activate your patient portal called "MyChart".  Sign up information is provided on this After Visit Summary.  MyChart is used to connect with patients for Virtual Visits (Telemedicine).  Patients are able to view lab/test results, encounter notes, upcoming appointments, etc.  Non-urgent messages can be sent to your provider as well. To learn more about what you can do with MyChart, please visit --  [email or web address].

## 2024-02-04 NOTE — Progress Notes (Addendum)
    Procedures performed today:    None.  Independent interpretation of notes and tests performed by another provider:   None.  Brief History, Exam, Impression, and Recommendations:    BP (!) 88/54 (BP Location: Left Arm, Patient Position: Sitting, Cuff Size: Normal)   Pulse 81   Ht 5\' 11"  (1.803 m)   Wt 218 lb 9.6 oz (99.2 kg)   SpO2 96%   BMI 30.49 kg/m   For a few months now, patient has been noticing issues with lightheadedness and dizziness.  He will primarily noticed that his with postural changes including going from a sitting or lying position to a standing position.  He will also noted at times if he bends over and then stands back up.  He has also felt that he has had lower energy.  His wife accompanies him to visit today and reports that patient has also been somewhat more irritable.  Blood pressure has been running lower for patient as well.  He has not had any fever, chills, sweats.  Has not noticed any weight loss.  No palpitations or chest pain. On exam, patient is in no acute distress, blood pressure is low, heart rate normal.  He does have systolic murmur present on auscultation.  Patient with known underlying pressure gradient across aortic valve replacement with most recent echocardiogram earlier this year.  Lungs clear to auscultation bilaterally. Discussed considerations related to ongoing symptoms.  I do feel that the lightheadedness and dizziness being experienced are related to observed low blood pressure.  Did discuss that there are a number of things which can be related to observed low blood pressure which would require further evaluation.  He is due for recheck of thyroid  labs after having started thyroid  medication previously.  Additionally, he did have mild anemia on labs completed related to recent surgery.  Would recommend following up on this.  Medication wise, not currently on any strong antihypertensives.  He is currently taking metoprolol  which could be  blunting compensatory heart rate, however discussed that this is not as impactful on the blood pressure.  May need to consider lowering dose of metoprolol  Plan for follow-up at previously scheduled appointment.  Essential hypertension -     CBC with Differential/Platelet -     Basic metabolic panel with GFR  Hypothyroidism, unspecified type -     TSH + free T4  Anemia, unspecified type -     CBC with Differential/Platelet -     Basic metabolic panel with GFR   ___________________________________________ Andria Head [name], MD, ABFM, CAQSM Primary Care and Sports Medicine [facility]

## 2024-02-05 ENCOUNTER — Ambulatory Visit (HOSPITAL_BASED_OUTPATIENT_CLINIC_OR_DEPARTMENT_OTHER): Payer: Self-pay | Admitting: Family Medicine

## 2024-02-05 DIAGNOSIS — E039 Hypothyroidism, unspecified: Secondary | ICD-10-CM

## 2024-02-05 DIAGNOSIS — I10 Essential (primary) hypertension: Secondary | ICD-10-CM

## 2024-02-05 LAB — CBC WITH DIFFERENTIAL/PLATELET
Basophils Absolute: 0.1 10*3/uL (ref 0.0–0.2)
Basos: 1 %
EOS (ABSOLUTE): 0.2 10*3/uL (ref 0.0–0.4)
Eos: 3 %
Hematocrit: 48.2 % (ref 37.5–51.0)
Hemoglobin: 15.6 g/dL (ref 13.0–17.7)
Immature Grans (Abs): 0.1 10*3/uL (ref 0.0–0.1)
Immature Granulocytes: 1 %
Lymphocytes Absolute: 1.1 10*3/uL (ref 0.7–3.1)
Lymphs: 13 %
MCH: 29.9 pg (ref 26.6–33.0)
MCHC: 32.4 g/dL (ref 31.5–35.7)
MCV: 92 fL (ref 79–97)
Monocytes Absolute: 0.8 10*3/uL (ref 0.1–0.9)
Monocytes: 9 %
Neutrophils Absolute: 6.4 10*3/uL (ref 1.4–7.0)
Neutrophils: 73 %
Platelets: 224 10*3/uL (ref 150–450)
RBC: 5.22 x10E6/uL (ref 4.14–5.80)
RDW: 13.6 % (ref 11.6–15.4)
WBC: 8.6 10*3/uL (ref 3.4–10.8)

## 2024-02-05 LAB — BASIC METABOLIC PANEL WITH GFR
BUN/Creatinine Ratio: 12 (ref 10–24)
BUN: 15 mg/dL (ref 8–27)
CO2: 22 mmol/L (ref 20–29)
Calcium: 10.2 mg/dL (ref 8.6–10.2)
Chloride: 98 mmol/L (ref 96–106)
Creatinine, Ser: 1.28 mg/dL — ABNORMAL HIGH (ref 0.76–1.27)
Glucose: 96 mg/dL (ref 70–99)
Potassium: 4.7 mmol/L (ref 3.5–5.2)
Sodium: 137 mmol/L (ref 134–144)
eGFR: 58 mL/min/{1.73_m2} — ABNORMAL LOW (ref >59)

## 2024-02-05 LAB — TSH+FREE T4
Free T4: 0.98 ng/dL (ref 0.82–1.77)
TSH: 8.35 u[IU]/mL — ABNORMAL HIGH (ref 0.450–4.500)

## 2024-02-05 MED ORDER — LEVOTHYROXINE SODIUM 50 MCG PO TABS: 50.0000 ug | ORAL_TABLET | Freq: Every day | ORAL | 1 refills | Status: DC

## 2024-02-07 ENCOUNTER — Ambulatory Visit (HOSPITAL_BASED_OUTPATIENT_CLINIC_OR_DEPARTMENT_OTHER): Payer: Medicare Other | Admitting: Family Medicine

## 2024-02-12 ENCOUNTER — Encounter (HOSPITAL_BASED_OUTPATIENT_CLINIC_OR_DEPARTMENT_OTHER): Payer: Medicare Other

## 2024-02-14 ENCOUNTER — Encounter (HOSPITAL_BASED_OUTPATIENT_CLINIC_OR_DEPARTMENT_OTHER)

## 2024-02-28 DIAGNOSIS — R350 Frequency of micturition: Secondary | ICD-10-CM | POA: Diagnosis not present

## 2024-03-08 ENCOUNTER — Other Ambulatory Visit (HOSPITAL_BASED_OUTPATIENT_CLINIC_OR_DEPARTMENT_OTHER): Payer: Self-pay | Admitting: Family Medicine

## 2024-03-08 DIAGNOSIS — E1142 Type 2 diabetes mellitus with diabetic polyneuropathy: Secondary | ICD-10-CM

## 2024-03-16 ENCOUNTER — Other Ambulatory Visit: Payer: Self-pay | Admitting: Oncology

## 2024-03-16 ENCOUNTER — Other Ambulatory Visit (HOSPITAL_BASED_OUTPATIENT_CLINIC_OR_DEPARTMENT_OTHER): Payer: Self-pay | Admitting: Family Medicine

## 2024-03-16 DIAGNOSIS — I824Z1 Acute embolism and thrombosis of unspecified deep veins of right distal lower extremity: Secondary | ICD-10-CM

## 2024-03-17 DIAGNOSIS — K080 Exfoliation of teeth due to systemic causes: Secondary | ICD-10-CM | POA: Diagnosis not present

## 2024-03-20 DIAGNOSIS — N3289 Other specified disorders of bladder: Secondary | ICD-10-CM | POA: Diagnosis not present

## 2024-03-20 DIAGNOSIS — N529 Male erectile dysfunction, unspecified: Secondary | ICD-10-CM | POA: Diagnosis not present

## 2024-03-20 DIAGNOSIS — N401 Enlarged prostate with lower urinary tract symptoms: Secondary | ICD-10-CM | POA: Diagnosis not present

## 2024-03-20 DIAGNOSIS — R350 Frequency of micturition: Secondary | ICD-10-CM | POA: Diagnosis not present

## 2024-03-25 ENCOUNTER — Other Ambulatory Visit (HOSPITAL_BASED_OUTPATIENT_CLINIC_OR_DEPARTMENT_OTHER): Payer: Self-pay | Admitting: Family Medicine

## 2024-04-01 DIAGNOSIS — R350 Frequency of micturition: Secondary | ICD-10-CM | POA: Diagnosis not present

## 2024-04-04 ENCOUNTER — Encounter (HOSPITAL_BASED_OUTPATIENT_CLINIC_OR_DEPARTMENT_OTHER)

## 2024-04-07 ENCOUNTER — Ambulatory Visit (INDEPENDENT_AMBULATORY_CARE_PROVIDER_SITE_OTHER): Admitting: Family Medicine

## 2024-04-07 ENCOUNTER — Encounter (HOSPITAL_BASED_OUTPATIENT_CLINIC_OR_DEPARTMENT_OTHER): Payer: Self-pay | Admitting: Family Medicine

## 2024-04-07 VITALS — BP 126/61 | HR 53 | Ht 71.0 in | Wt 218.8 lb

## 2024-04-07 DIAGNOSIS — Z7985 Long-term (current) use of injectable non-insulin antidiabetic drugs: Secondary | ICD-10-CM

## 2024-04-07 DIAGNOSIS — E1142 Type 2 diabetes mellitus with diabetic polyneuropathy: Secondary | ICD-10-CM | POA: Diagnosis not present

## 2024-04-07 DIAGNOSIS — Z7984 Long term (current) use of oral hypoglycemic drugs: Secondary | ICD-10-CM | POA: Diagnosis not present

## 2024-04-07 DIAGNOSIS — I10 Essential (primary) hypertension: Secondary | ICD-10-CM

## 2024-04-07 DIAGNOSIS — E039 Hypothyroidism, unspecified: Secondary | ICD-10-CM

## 2024-04-07 NOTE — Patient Instructions (Signed)
   Medication Instructions:  Your physician recommends that you continue on your current medications as directed. Please refer to the Current Medication list given to you today. --If you need a refill on any your medications before your next appointment, please call your pharmacy first. If no refills are authorized on file call the office.-- Lab Work: Your physician has recommended that you have lab work today: today If you have labs (blood work) drawn today and your tests are completely normal, you will receive your results via MyChart message OR a phone call from our staff.  Please ensure you check your voicemail in the event that you authorized detailed messages to be left on a delegated number. If you have any lab test that is abnormal or we need to change your treatment, we will call you to review the results.    Follow-Up: Your next appointment:   Your physician recommends that you schedule a follow-up appointment in: 6 month follow up with Dr. [name]  You will receive a text message or e-mail with a link to a survey about your care and experience with [place] today! We would greatly appreciate your feedback!   Thanks for letting us be apart of your health journey!!  Primary Care and Sports Medicine   Dr. Ceasar Mons Peru   We encourage you to activate your patient portal called "MyChart".  Sign up information is provided on this After Visit Summary.  MyChart is used to connect with patients for Virtual Visits (Telemedicine).  Patients are able to view lab/test results, encounter notes, upcoming appointments, etc.  Non-urgent messages can be sent to your provider as well. To learn more about what you can do with MyChart, please visit --  [email or web address].

## 2024-04-07 NOTE — Assessment & Plan Note (Signed)
 Blood pressure improved today as compared to prior office visit.  Generally, patient has been doing well, denies any issues today.  No changes today.  Recommend intermittent monitoring blood pressure at home, DASH diet

## 2024-04-07 NOTE — Assessment & Plan Note (Signed)
 Due for recheck of TSH today with recent change in dose of levothyroxine .  Further recommendations pending results of labs

## 2024-04-07 NOTE — Assessment & Plan Note (Signed)
 A1c has been well-controlled.  Can continue with current medications including Farxiga , Ozempic .  Denies any issues with medications at this time We will check urine microalbumin/creatinine ratio today.

## 2024-04-07 NOTE — Progress Notes (Signed)
    Procedures performed today:    None.  Independent interpretation of notes and tests performed by another provider:   None.  Brief History, Exam, Impression, and Recommendations:    BP 126/61 (BP Location: Left Arm, Patient Position: Sitting, Cuff Size: Large)   Pulse (!) 53   Ht 5' 11 (1.803 m)   Wt 218 lb 12.8 oz (99.2 kg)   SpO2 94%   BMI 30.52 kg/m   Type 2 diabetes mellitus with diabetic polyneuropathy, without long-term current use of insulin  (HCC) Assessment & Plan: A1c has been well-controlled.  Can continue with current medications including Farxiga , Ozempic .  Denies any issues with medications at this time We will check urine microalbumin/creatinine ratio today.  Orders: -     Microalbumin / creatinine urine ratio  Essential hypertension Assessment & Plan: Blood pressure improved today as compared to prior office visit.  Generally, patient has been doing well, denies any issues today.  No changes today.  Recommend intermittent monitoring blood pressure at home, DASH diet  Orders: -     Basic metabolic panel with GFR  Hypothyroidism, unspecified type Assessment & Plan: Due for recheck of TSH today with recent change in dose of levothyroxine .  Further recommendations pending results of labs  Orders: -     TSH  Return in about 6 months (around 10/08/2024) for hypertension, diabetes.   ___________________________________________ Kirk Dewan [name], MD, ABFM, CAQSM Primary Care and Sports Medicine [facility]

## 2024-04-08 LAB — BASIC METABOLIC PANEL WITH GFR
BUN/Creatinine Ratio: 13 (ref 10–24)
BUN: 14 mg/dL (ref 8–27)
CO2: 23 mmol/L (ref 20–29)
Calcium: 9.7 mg/dL (ref 8.6–10.2)
Chloride: 100 mmol/L (ref 96–106)
Creatinine, Ser: 1.1 mg/dL (ref 0.76–1.27)
Glucose: 117 mg/dL — ABNORMAL HIGH (ref 70–99)
Potassium: 5.1 mmol/L (ref 3.5–5.2)
Sodium: 139 mmol/L (ref 134–144)
eGFR: 70 mL/min/1.73 (ref >59)

## 2024-04-08 LAB — MICROALBUMIN / CREATININE URINE RATIO
Creatinine, Urine: 78.3 mg/dL
Microalb/Creat Ratio: 4 mg/g{creat} (ref 0–29)
Microalbumin, Urine: 3.1 ug/mL

## 2024-04-08 LAB — TSH: TSH: 4.74 u[IU]/mL — ABNORMAL HIGH (ref 0.450–4.500)

## 2024-04-21 ENCOUNTER — Ambulatory Visit (HOSPITAL_BASED_OUTPATIENT_CLINIC_OR_DEPARTMENT_OTHER): Payer: Self-pay | Admitting: Family Medicine

## 2024-04-23 ENCOUNTER — Other Ambulatory Visit (HOSPITAL_BASED_OUTPATIENT_CLINIC_OR_DEPARTMENT_OTHER): Payer: Self-pay | Admitting: Family Medicine

## 2024-04-23 DIAGNOSIS — I10 Essential (primary) hypertension: Secondary | ICD-10-CM

## 2024-04-23 DIAGNOSIS — E119 Type 2 diabetes mellitus without complications: Secondary | ICD-10-CM

## 2024-05-03 ENCOUNTER — Other Ambulatory Visit (HOSPITAL_BASED_OUTPATIENT_CLINIC_OR_DEPARTMENT_OTHER): Payer: Self-pay | Admitting: Family Medicine

## 2024-05-03 DIAGNOSIS — E1142 Type 2 diabetes mellitus with diabetic polyneuropathy: Secondary | ICD-10-CM

## 2024-05-07 DIAGNOSIS — R350 Frequency of micturition: Secondary | ICD-10-CM | POA: Diagnosis not present

## 2024-05-10 ENCOUNTER — Emergency Department (HOSPITAL_BASED_OUTPATIENT_CLINIC_OR_DEPARTMENT_OTHER)

## 2024-05-10 ENCOUNTER — Emergency Department (HOSPITAL_BASED_OUTPATIENT_CLINIC_OR_DEPARTMENT_OTHER)
Admission: EM | Admit: 2024-05-10 | Discharge: 2024-05-10 | Disposition: A | Discharge status: Home / Self Care | Attending: Emergency Medicine | Admitting: Emergency Medicine

## 2024-05-10 DIAGNOSIS — Z794 Long term (current) use of insulin: Secondary | ICD-10-CM | POA: Insufficient documentation

## 2024-05-10 DIAGNOSIS — Z7982 Long term (current) use of aspirin: Secondary | ICD-10-CM | POA: Insufficient documentation

## 2024-05-10 DIAGNOSIS — E1122 Type 2 diabetes mellitus with diabetic chronic kidney disease: Secondary | ICD-10-CM | POA: Diagnosis not present

## 2024-05-10 DIAGNOSIS — Z955 Presence of coronary angioplasty implant and graft: Secondary | ICD-10-CM | POA: Insufficient documentation

## 2024-05-10 DIAGNOSIS — E872 Acidosis, unspecified: Secondary | ICD-10-CM | POA: Diagnosis not present

## 2024-05-10 DIAGNOSIS — Z01810 Encounter for preprocedural cardiovascular examination: Secondary | ICD-10-CM | POA: Diagnosis not present

## 2024-05-10 DIAGNOSIS — Z7984 Long term (current) use of oral hypoglycemic drugs: Secondary | ICD-10-CM | POA: Diagnosis not present

## 2024-05-10 DIAGNOSIS — Y831 Surgical operation with implant of artificial internal device as the cause of abnormal reaction of the patient, or of later complication, without mention of misadventure at the time of the procedure: Secondary | ICD-10-CM | POA: Diagnosis present

## 2024-05-10 DIAGNOSIS — I2602 Saddle embolus of pulmonary artery with acute cor pulmonale: Secondary | ICD-10-CM | POA: Diagnosis not present

## 2024-05-10 DIAGNOSIS — K8020 Calculus of gallbladder without cholecystitis without obstruction: Secondary | ICD-10-CM | POA: Diagnosis not present

## 2024-05-10 DIAGNOSIS — I130 Hypertensive heart and chronic kidney disease with heart failure and stage 1 through stage 4 chronic kidney disease, or unspecified chronic kidney disease: Secondary | ICD-10-CM | POA: Diagnosis not present

## 2024-05-10 DIAGNOSIS — E7800 Pure hypercholesterolemia, unspecified: Secondary | ICD-10-CM | POA: Diagnosis not present

## 2024-05-10 DIAGNOSIS — R011 Cardiac murmur, unspecified: Secondary | ICD-10-CM | POA: Insufficient documentation

## 2024-05-10 DIAGNOSIS — R9389 Abnormal findings on diagnostic imaging of other specified body structures: Secondary | ICD-10-CM | POA: Diagnosis not present

## 2024-05-10 DIAGNOSIS — J189 Pneumonia, unspecified organism: Secondary | ICD-10-CM | POA: Diagnosis not present

## 2024-05-10 DIAGNOSIS — Z79890 Hormone replacement therapy (postmenopausal): Secondary | ICD-10-CM | POA: Diagnosis not present

## 2024-05-10 DIAGNOSIS — I472 Ventricular tachycardia, unspecified: Secondary | ICD-10-CM | POA: Diagnosis not present

## 2024-05-10 DIAGNOSIS — T82857A Stenosis of cardiac prosthetic devices, implants and grafts, initial encounter: Secondary | ICD-10-CM | POA: Diagnosis not present

## 2024-05-10 DIAGNOSIS — R062 Wheezing: Secondary | ICD-10-CM | POA: Diagnosis not present

## 2024-05-10 DIAGNOSIS — I2722 Pulmonary hypertension due to left heart disease: Secondary | ICD-10-CM | POA: Diagnosis not present

## 2024-05-10 DIAGNOSIS — R059 Cough, unspecified: Secondary | ICD-10-CM | POA: Insufficient documentation

## 2024-05-10 DIAGNOSIS — Z1152 Encounter for screening for COVID-19: Secondary | ICD-10-CM | POA: Diagnosis not present

## 2024-05-10 DIAGNOSIS — N1831 Chronic kidney disease, stage 3a: Secondary | ICD-10-CM | POA: Diagnosis not present

## 2024-05-10 DIAGNOSIS — Z7901 Long term (current) use of anticoagulants: Secondary | ICD-10-CM | POA: Insufficient documentation

## 2024-05-10 DIAGNOSIS — I2510 Atherosclerotic heart disease of native coronary artery without angina pectoris: Secondary | ICD-10-CM | POA: Diagnosis not present

## 2024-05-10 DIAGNOSIS — J9811 Atelectasis: Secondary | ICD-10-CM | POA: Diagnosis not present

## 2024-05-10 DIAGNOSIS — I340 Nonrheumatic mitral (valve) insufficiency: Secondary | ICD-10-CM | POA: Diagnosis not present

## 2024-05-10 DIAGNOSIS — I5031 Acute diastolic (congestive) heart failure: Secondary | ICD-10-CM | POA: Diagnosis not present

## 2024-05-10 DIAGNOSIS — J9601 Acute respiratory failure with hypoxia: Secondary | ICD-10-CM | POA: Diagnosis not present

## 2024-05-10 DIAGNOSIS — J40 Bronchitis, not specified as acute or chronic: Secondary | ICD-10-CM | POA: Diagnosis not present

## 2024-05-10 DIAGNOSIS — I2489 Other forms of acute ischemic heart disease: Secondary | ICD-10-CM | POA: Diagnosis not present

## 2024-05-10 DIAGNOSIS — I2782 Chronic pulmonary embolism: Secondary | ICD-10-CM | POA: Diagnosis not present

## 2024-05-10 DIAGNOSIS — I509 Heart failure, unspecified: Secondary | ICD-10-CM | POA: Diagnosis not present

## 2024-05-10 DIAGNOSIS — R570 Cardiogenic shock: Secondary | ICD-10-CM | POA: Diagnosis not present

## 2024-05-10 DIAGNOSIS — I272 Pulmonary hypertension, unspecified: Secondary | ICD-10-CM | POA: Diagnosis not present

## 2024-05-10 DIAGNOSIS — R0602 Shortness of breath: Secondary | ICD-10-CM | POA: Diagnosis not present

## 2024-05-10 DIAGNOSIS — E039 Hypothyroidism, unspecified: Secondary | ICD-10-CM | POA: Diagnosis not present

## 2024-05-10 DIAGNOSIS — E1142 Type 2 diabetes mellitus with diabetic polyneuropathy: Secondary | ICD-10-CM | POA: Diagnosis not present

## 2024-05-10 DIAGNOSIS — R918 Other nonspecific abnormal finding of lung field: Secondary | ICD-10-CM | POA: Diagnosis not present

## 2024-05-10 DIAGNOSIS — Z86718 Personal history of other venous thrombosis and embolism: Secondary | ICD-10-CM | POA: Insufficient documentation

## 2024-05-10 DIAGNOSIS — Z6831 Body mass index (BMI) 31.0-31.9, adult: Secondary | ICD-10-CM | POA: Diagnosis not present

## 2024-05-10 DIAGNOSIS — I5021 Acute systolic (congestive) heart failure: Secondary | ICD-10-CM | POA: Diagnosis not present

## 2024-05-10 DIAGNOSIS — N189 Chronic kidney disease, unspecified: Secondary | ICD-10-CM | POA: Diagnosis not present

## 2024-05-10 DIAGNOSIS — R0981 Nasal congestion: Secondary | ICD-10-CM | POA: Insufficient documentation

## 2024-05-10 DIAGNOSIS — Z951 Presence of aortocoronary bypass graft: Secondary | ICD-10-CM | POA: Diagnosis not present

## 2024-05-10 DIAGNOSIS — N179 Acute kidney failure, unspecified: Secondary | ICD-10-CM | POA: Diagnosis not present

## 2024-05-10 DIAGNOSIS — J810 Acute pulmonary edema: Secondary | ICD-10-CM | POA: Diagnosis not present

## 2024-05-10 DIAGNOSIS — I5033 Acute on chronic diastolic (congestive) heart failure: Secondary | ICD-10-CM | POA: Diagnosis not present

## 2024-05-10 DIAGNOSIS — J209 Acute bronchitis, unspecified: Secondary | ICD-10-CM | POA: Diagnosis not present

## 2024-05-10 DIAGNOSIS — Z7985 Long-term (current) use of injectable non-insulin antidiabetic drugs: Secondary | ICD-10-CM | POA: Diagnosis not present

## 2024-05-10 DIAGNOSIS — I4891 Unspecified atrial fibrillation: Secondary | ICD-10-CM | POA: Diagnosis not present

## 2024-05-10 DIAGNOSIS — I350 Nonrheumatic aortic (valve) stenosis: Secondary | ICD-10-CM | POA: Diagnosis not present

## 2024-05-10 DIAGNOSIS — I110 Hypertensive heart disease with heart failure: Secondary | ICD-10-CM | POA: Diagnosis not present

## 2024-05-10 DIAGNOSIS — Z953 Presence of xenogenic heart valve: Secondary | ICD-10-CM | POA: Diagnosis not present

## 2024-05-10 LAB — BASIC METABOLIC PANEL WITH GFR
Anion gap: 12 (ref 5–15)
BUN: 11 mg/dL (ref 8–23)
CO2: 23 mmol/L (ref 22–32)
Calcium: 9.1 mg/dL (ref 8.9–10.3)
Chloride: 105 mmol/L (ref 98–111)
Creatinine, Ser: 1.11 mg/dL (ref 0.61–1.24)
GFR, Estimated: >60 mL/min (ref >60)
Glucose, Bld: 126 mg/dL — ABNORMAL HIGH (ref 70–99)
Potassium: 4.5 mmol/L (ref 3.5–5.1)
Sodium: 140 mmol/L (ref 135–145)

## 2024-05-10 LAB — CBC WITH DIFFERENTIAL/PLATELET
Abs Immature Granulocytes: 0.09 K/uL — ABNORMAL HIGH (ref 0.00–0.07)
Basophils Absolute: 0.1 K/uL (ref 0.0–0.1)
Basophils Relative: 1 %
Eosinophils Absolute: 0.2 K/uL (ref 0.0–0.5)
Eosinophils Relative: 3 %
HCT: 47 % (ref 39.0–52.0)
Hemoglobin: 15 g/dL (ref 13.0–17.0)
Immature Granulocytes: 1 %
Lymphocytes Relative: 14 %
Lymphs Abs: 0.9 K/uL (ref 0.7–4.0)
MCH: 30.1 pg (ref 26.0–34.0)
MCHC: 31.9 g/dL (ref 30.0–36.0)
MCV: 94.4 fL (ref 80.0–100.0)
Monocytes Absolute: 0.6 K/uL (ref 0.1–1.0)
Monocytes Relative: 9 %
Neutro Abs: 4.7 K/uL (ref 1.7–7.7)
Neutrophils Relative %: 72 %
Platelets: 145 K/uL — ABNORMAL LOW (ref 150–400)
RBC: 4.98 MIL/uL (ref 4.22–5.81)
RDW: 15.4 % (ref 11.5–15.5)
WBC: 6.5 K/uL (ref 4.0–10.5)
nRBC: 0 % (ref 0.0–0.2)

## 2024-05-10 LAB — RESP PANEL BY RT-PCR (RSV, FLU A&B, COVID)  RVPGX2
Influenza A by PCR: NEGATIVE
Influenza B by PCR: NEGATIVE
Resp Syncytial Virus by PCR: NEGATIVE
SARS Coronavirus 2 by RT PCR: NEGATIVE

## 2024-05-10 LAB — PRO BRAIN NATRIURETIC PEPTIDE: Pro Brain Natriuretic Peptide: 1252 pg/mL — ABNORMAL HIGH (ref <300.0)

## 2024-05-10 MED ORDER — ALBUTEROL SULFATE (2.5 MG/3ML) 0.083% IN NEBU
INHALATION_SOLUTION | RESPIRATORY_TRACT | Status: AC
  Filled 2024-05-10: qty 3

## 2024-05-10 MED ORDER — IPRATROPIUM-ALBUTEROL 0.5-2.5 (3) MG/3ML IN SOLN
RESPIRATORY_TRACT | Status: AC
  Filled 2024-05-10: qty 3

## 2024-05-10 MED ORDER — PREDNISONE 20 MG PO TABS: ORAL_TABLET | ORAL | 0 refills | Status: DC

## 2024-05-10 MED ORDER — BENZONATATE 100 MG PO CAPS
100.0000 mg | ORAL_CAPSULE | Freq: Three times a day (TID) | ORAL | 0 refills | Status: DC
Start: 2024-05-10 — End: 2024-05-20

## 2024-05-10 MED ORDER — IPRATROPIUM-ALBUTEROL 0.5-2.5 (3) MG/3ML IN SOLN
3.0000 mL | Freq: Once | RESPIRATORY_TRACT | Status: AC
  Administered 2024-05-10: 3 mL via RESPIRATORY_TRACT

## 2024-05-10 MED ORDER — ALBUTEROL SULFATE (2.5 MG/3ML) 0.083% IN NEBU
2.5000 mg | INHALATION_SOLUTION | Freq: Once | RESPIRATORY_TRACT | Status: AC
  Administered 2024-05-10: 2.5 mg via RESPIRATORY_TRACT

## 2024-05-10 MED ORDER — ALBUTEROL SULFATE HFA 108 (90 BASE) MCG/ACT IN AERS
2.0000 | INHALATION_SPRAY | RESPIRATORY_TRACT | Status: DC | PRN
  Administered 2024-05-10: 2 via RESPIRATORY_TRACT
  Filled 2024-05-10: qty 6.7

## 2024-05-10 NOTE — ED Notes (Signed)
 SPO2 while ambulating 94% with average heart rate of 68. He tolerates this well.

## 2024-05-10 NOTE — ED Triage Notes (Signed)
 States became increasingly SHOB and cough starting this morning. Denies hx of pulmonary issues. Denies fevers. Hx of CHF.

## 2024-05-10 NOTE — Discharge Instructions (Signed)
 Your symptoms likely bronchitis, use albuterol  inhaler 2 puffs every 4 hours as needed for shortness of breath, take Tessalon  as needed for cough, and take prednisone  as prescribed.  Follow-up closely with your doctor for further care.  If you develop worsening symptoms including more trouble breathing having fever or having any chest pain please return

## 2024-05-10 NOTE — ED Provider Notes (Signed)
 [facility] EMERGENCY DEPARTMENT AT [facility] Provider Note   CSN: [id number] Arrival date & time: 05/10/24  9160     Patient presents with: Shortness of Breath   Kirk [name] is a 78 y.o. male.   The history is provided by the patient, the spouse and medical records. No language interpreter was used.  Shortness of Breath    78 year old male with history of PE/DVT currently on Eliquis , CHF, CAD status post CABG x 1, aortic stenosis status post AVR, atrial fibrillation presenting with complaints of shortness of breath.  Patient states since yesterday he has had increased shortness of breath and wheezing with cough productive with some small amount of white sputum.  He feels like he was having trouble catching his breath this morning prompting the ER visit.  He did receive a breathing treatment prior to my evaluation is that he feels little better.  He does not endorse any fever or chills no runny nose but does endorse some congestion.  Denies sore throat body aches fatigue chest pain abdominal pain nausea vomiting diarrhea increase fluid gain.  Denies any recent sick contact.  He is compliant with his Eliquis .  Prior to Admission medications   Medication Sig Start Date End Date Taking? Authorizing Provider  apixaban  (ELIQUIS ) 2.5 MG TABS tablet Take 1 tablet by mouth twice daily 03/17/24   Cloretta Arley NOVAK, MD  aspirin  EC 81 MG tablet Take 81 mg by mouth daily.    [provider]  DULoxetine  (CYMBALTA ) 60 MG capsule Take 1 capsule by mouth at bedtime 04/23/24   [name], [name], MD  FARXIGA  5 MG TABS tablet Take 1 tablet by mouth once daily 04/23/24   [name], [name], MD  HYDROmorphone  (DILAUDID ) 2 MG tablet Take 0.5-1 tablets (1-2 mg total) by mouth every 8 (eight) hours as needed for severe pain (pain score 7-10). 11/06/23   Edmisten, Roxie CROME, PA  levothyroxine  (SYNTHROID ) 50 MCG tablet Take 1 tablet (50 mcg total) by mouth daily before breakfast. 02/05/24   [name],  [name], MD  losartan  (COZAAR ) 50 MG tablet Take 1 tablet by mouth once daily 04/23/24   [name], [name], MD  methocarbamol  (ROBAXIN ) 500 MG tablet Take 1 tablet (500 mg total) by mouth every 6 (six) hours as needed for muscle spasms. 11/06/23   Edmisten, Kristie L, PA  metoprolol  tartrate (LOPRESSOR ) 25 MG tablet Take 1 tablet by mouth twice daily 03/17/24   [name], [name], MD  MYRBETRIQ  25 MG TB24 tablet Take 1 tablet (25 mg total) by mouth daily. 06/14/23   [name], Raymond J, MD  NEEDLE, DISP, 22 G 22G X 1-1/2 MISC 1 each by Does not apply route once a week. Use to administer medication. 02/28/23   [name], [name], MD  ondansetron  (ZOFRAN ) 4 MG tablet Take 1 tablet (4 mg total) by mouth every 6 (six) hours as needed for nausea. 11/06/23   Edmisten, Kristie L, PA  OZEMPIC , 0.25 OR 0.5 MG/DOSE, 2 MG/3ML SOPN INJECT 0.25MG   SUBCUTANEOUSLY ONCE A WEEK 05/06/24   [name], [name], MD  rosuvastatin  (CRESTOR ) 40 MG tablet TAKE 1 TABLET BY MOUTH AT BEDTIME 03/25/24   [name], [name], MD  traMADol  (ULTRAM ) 50 MG tablet Take 1-2 tablets (50-100 mg total) by mouth every 6 (six) hours as needed for moderate pain (pain score 4-6). 11/06/23 11/05/24  Edmisten, Kristie L, PA  triamcinolone  cream (KENALOG ) 0.1 % APPLY CREAM EXTERNALLY  TO AFFECTED AREA TWICE DAILY AS NEEDED FOR ITCHING    [provider]  Vitamins-Lipotropics (LIPO-FLAVONOID PLUS) TABS Take 1 tablet by mouth daily.    [provider]    Allergies: Cat dander, Hydrocodone , Atorvastatin , Dust mite extract, Metformin, Oxycodone , and Semaglutide (0.25 or 0.5mg -dos)    Review of Systems  Respiratory:  Positive for shortness of breath.   All other systems reviewed and are negative.   Updated Vital Signs BP 137/85 (BP Location: Right Arm)   Pulse 69   Temp 98.7 F (37.1 C) (Oral)   Resp (!) 24   SpO2 97%   Physical Exam Constitutional:      General: He is not in acute distress.    Appearance: He is well-developed.   HENT:     Head: Atraumatic.  Eyes:     Conjunctiva/sclera: Conjunctivae normal.  Neck:     Comments: No JVD Cardiovascular:     Rate and Rhythm: Normal rate and regular rhythm.     Heart sounds: Murmur heard.  Pulmonary:     Breath sounds: Wheezing present. No rhonchi or rales.     Comments: Mild tachypnea Abdominal:     Tenderness: There is no abdominal tenderness.  Musculoskeletal:     Cervical back: Normal range of motion and neck supple.     Right lower leg: No edema.     Left lower leg: No edema.  Skin:    Findings: No rash.  Neurological:     Mental Status: He is alert and oriented to person, place, and time.     (all labs ordered are listed, but only abnormal results are displayed) Labs Reviewed  BASIC METABOLIC PANEL WITH GFR - Abnormal; Notable for the following components:      Result Value   Glucose, Bld 126 (*)    All other components within normal limits  CBC WITH DIFFERENTIAL/PLATELET - Abnormal; Notable for the following components:   Platelets 145 (*)    Abs Immature Granulocytes 0.09 (*)    All other components within normal limits  PRO BRAIN NATRIURETIC PEPTIDE - Abnormal; Notable for the following components:   Pro Brain Natriuretic Peptide 1,252.0 (*)    All other components within normal limits  RESP PANEL BY RT-PCR (RSV, FLU A&B, COVID)  [id number]    EKG: EKG Interpretation Date/Time:  Saturday May 10 2024 09:13:39 EDT Ventricular Rate:  58 PR Interval:  217 QRS Duration:  112 QT Interval:  430 QTC Calculation: 423 R Axis:   27  Text Interpretation: Sinus bradycardia Atrial premature complex Borderline prolonged PR interval Anteroseptal infarct, old Abnormal T, consider ischemia, lateral leads Confirmed by Jerrol Agent ([id number]) on 05/10/2024 9:50:35 AM  Radiology: [facility] Chest Port 1 View Result Date: 05/10/2024 CLINICAL DATA:  Shortness of breath. EXAM: PORTABLE CHEST 1 VIEW COMPARISON:  March 17, 2022. FINDINGS: Stable cardiomediastinal  silhouette. Sternotomy wires are noted. No acute pulmonary disease. Bony thorax is unremarkable. IMPRESSION: No active disease. Electronically Signed   By: Agent Landy Raddle M.D.   On: 05/10/2024 09:32     Procedures   Medications Ordered in the ED  albuterol  (PROVENTIL ) (2.5 MG/3ML) 0.083% nebulizer solution (has no administration in time range)  albuterol  (PROVENTIL ) (2.5 MG/3ML) 0.083% nebulizer solution 2.5 mg (2.5 mg Nebulization Given 05/10/24 0849)                                    Medical  Decision Making Amount and/or Complexity of Data Reviewed Labs: ordered. Radiology: ordered.  Risk Prescription drug management.   BP 137/85 (BP Location: Right Arm)   Pulse 69   Temp 98.7 F (37.1 C) (Oral)   Resp (!) 24   SpO2 97%   108:20 AM  78 year old male with history of PE/DVT currently on Eliquis , CHF, CAD status post CABG x 1, aortic stenosis status post AVR, atrial fibrillation presenting with complaints of shortness of breath.  Patient states since yesterday he has had increased shortness of breath and wheezing with cough productive with some small amount of white sputum.  He feels like he was having trouble catching his breath this morning prompting the ER visit.  He did receive a breathing treatment prior to my evaluation is that he feels little better.  He does not endorse any fever or chills no runny nose but does endorse some congestion.  Denies sore throat body aches fatigue chest pain abdominal pain nausea vomiting diarrhea increase fluid gain.  Denies any recent sick contact.  He is compliant with his Eliquis .  Exam notable for an elderly male laying in bed in mild tachypnea.  He does have faint inspiratory and expiratory wheezes on exam.  He does not have any JVD no peripheral edema noted.  He is afebrile.  Will provide patient with additional breathing treatment, workup initiated.  -Labs ordered, independently viewed and interpreted by me.  Labs remarkable for proBNP of  1252, when age-adjusted it is within normal limit.  Negative COVID, flu, RSV.  Normal WBC, electrolyte panels are reassuring -The patient was maintained on a cardiac monitor.  I personally viewed and interpreted the cardiac monitored which showed an underlying rhythm of: Normal sinus rhythm -Imaging independently viewed and interpreted by me and I agree with radiologist's interpretation.  Result remarkable for chest x-ray without evidence of pneumonia or concerning feature -This patient presents to the ED for concern of shortness of breath, this involves an extensive number of treatment options, and is a complaint that carries with it a high risk of complications and morbidity.  The differential diagnosis includes bronchitis, viral illness, pneumonia, PE, CHF exacerbation, PTX -Co morbidities that complicate the patient evaluation includes CHF, PE DVT -Treatment includes DuoNebs -Reevaluation of the patient after these medicines showed that the patient improved.  Able to ambulate with O2 sat above 94% on room air -PCP office notes or outside notes reviewed -Discussion with attending Dr. Jerrol -Escalation to admission/observation considered: patients feels much better, is comfortable with discharge, and will follow up with PCP -Prescription medication considered, patient comfortable with prednisone , albuterol  inhaler, antitussive  -Social Determinant of Health considered   Symptoms suggestive of bronchitis.  Patient discharged home with albuterol  inhaler, Tessalon  [place], and a course of prednisone .     Final diagnoses:  Shortness of breath    ED Discharge Orders          Ordered    predniSONE  (DELTASONE ) 20 MG tablet        05/10/24 1055    benzonatate  (TESSALON ) 100 MG capsule  Every 8 hours        05/10/24 1055               Nivia Colon, PA-C 05/10/24 1057    Jerrol Agent, MD 05/10/24 1340

## 2024-05-13 ENCOUNTER — Emergency Department (HOSPITAL_BASED_OUTPATIENT_CLINIC_OR_DEPARTMENT_OTHER): Admitting: Radiology

## 2024-05-13 ENCOUNTER — Other Ambulatory Visit: Payer: Self-pay

## 2024-05-13 ENCOUNTER — Inpatient Hospital Stay (HOSPITAL_BASED_OUTPATIENT_CLINIC_OR_DEPARTMENT_OTHER)
Admission: EM | Admit: 2024-05-13 | Discharge: 2024-05-20 | DRG: 286 | Disposition: A | Discharge status: Home / Self Care | Attending: Internal Medicine | Admitting: Internal Medicine

## 2024-05-13 DIAGNOSIS — I4891 Unspecified atrial fibrillation: Secondary | ICD-10-CM | POA: Diagnosis present

## 2024-05-13 DIAGNOSIS — Y831 Surgical operation with implant of artificial internal device as the cause of abnormal reaction of the patient, or of later complication, without mention of misadventure at the time of the procedure: Secondary | ICD-10-CM | POA: Diagnosis present

## 2024-05-13 DIAGNOSIS — I130 Hypertensive heart and chronic kidney disease with heart failure and stage 1 through stage 4 chronic kidney disease, or unspecified chronic kidney disease: Secondary | ICD-10-CM | POA: Diagnosis present

## 2024-05-13 DIAGNOSIS — J9601 Acute respiratory failure with hypoxia: Secondary | ICD-10-CM | POA: Diagnosis not present

## 2024-05-13 DIAGNOSIS — E7800 Pure hypercholesterolemia, unspecified: Secondary | ICD-10-CM | POA: Diagnosis present

## 2024-05-13 DIAGNOSIS — Z86718 Personal history of other venous thrombosis and embolism: Secondary | ICD-10-CM

## 2024-05-13 DIAGNOSIS — I5031 Acute diastolic (congestive) heart failure: Secondary | ICD-10-CM | POA: Diagnosis not present

## 2024-05-13 DIAGNOSIS — Z8249 Family history of ischemic heart disease and other diseases of the circulatory system: Secondary | ICD-10-CM

## 2024-05-13 DIAGNOSIS — J810 Acute pulmonary edema: Secondary | ICD-10-CM | POA: Diagnosis not present

## 2024-05-13 DIAGNOSIS — Z8701 Personal history of pneumonia (recurrent): Secondary | ICD-10-CM

## 2024-05-13 DIAGNOSIS — E1122 Type 2 diabetes mellitus with diabetic chronic kidney disease: Secondary | ICD-10-CM | POA: Diagnosis present

## 2024-05-13 DIAGNOSIS — Z923 Personal history of irradiation: Secondary | ICD-10-CM

## 2024-05-13 DIAGNOSIS — I272 Pulmonary hypertension, unspecified: Secondary | ICD-10-CM | POA: Diagnosis not present

## 2024-05-13 DIAGNOSIS — Z952 Presence of prosthetic heart valve: Secondary | ICD-10-CM

## 2024-05-13 DIAGNOSIS — T82857A Stenosis of cardiac prosthetic devices, implants and grafts, initial encounter: Secondary | ICD-10-CM | POA: Diagnosis present

## 2024-05-13 DIAGNOSIS — Z803 Family history of malignant neoplasm of breast: Secondary | ICD-10-CM

## 2024-05-13 DIAGNOSIS — Z79899 Other long term (current) drug therapy: Secondary | ICD-10-CM

## 2024-05-13 DIAGNOSIS — Z800 Family history of malignant neoplasm of digestive organs: Secondary | ICD-10-CM

## 2024-05-13 DIAGNOSIS — Z79890 Hormone replacement therapy (postmenopausal): Secondary | ICD-10-CM

## 2024-05-13 DIAGNOSIS — Z7982 Long term (current) use of aspirin: Secondary | ICD-10-CM

## 2024-05-13 DIAGNOSIS — E1142 Type 2 diabetes mellitus with diabetic polyneuropathy: Secondary | ICD-10-CM

## 2024-05-13 DIAGNOSIS — J189 Pneumonia, unspecified organism: Secondary | ICD-10-CM | POA: Diagnosis present

## 2024-05-13 DIAGNOSIS — E872 Acidosis, unspecified: Secondary | ICD-10-CM | POA: Diagnosis not present

## 2024-05-13 DIAGNOSIS — Z981 Arthrodesis status: Secondary | ICD-10-CM

## 2024-05-13 DIAGNOSIS — I2489 Other forms of acute ischemic heart disease: Secondary | ICD-10-CM | POA: Diagnosis present

## 2024-05-13 DIAGNOSIS — Z6831 Body mass index (BMI) 31.0-31.9, adult: Secondary | ICD-10-CM

## 2024-05-13 DIAGNOSIS — E66811 Obesity, class 1: Secondary | ICD-10-CM | POA: Diagnosis present

## 2024-05-13 DIAGNOSIS — N1831 Chronic kidney disease, stage 3a: Secondary | ICD-10-CM | POA: Diagnosis present

## 2024-05-13 DIAGNOSIS — I252 Old myocardial infarction: Secondary | ICD-10-CM

## 2024-05-13 DIAGNOSIS — R570 Cardiogenic shock: Secondary | ICD-10-CM | POA: Diagnosis present

## 2024-05-13 DIAGNOSIS — Z7985 Long-term (current) use of injectable non-insulin antidiabetic drugs: Secondary | ICD-10-CM | POA: Diagnosis not present

## 2024-05-13 DIAGNOSIS — R062 Wheezing: Secondary | ICD-10-CM | POA: Diagnosis not present

## 2024-05-13 DIAGNOSIS — N179 Acute kidney failure, unspecified: Secondary | ICD-10-CM | POA: Diagnosis present

## 2024-05-13 DIAGNOSIS — N189 Chronic kidney disease, unspecified: Secondary | ICD-10-CM | POA: Diagnosis not present

## 2024-05-13 DIAGNOSIS — I5033 Acute on chronic diastolic (congestive) heart failure: Secondary | ICD-10-CM | POA: Diagnosis not present

## 2024-05-13 DIAGNOSIS — Z7901 Long term (current) use of anticoagulants: Secondary | ICD-10-CM

## 2024-05-13 DIAGNOSIS — I5021 Acute systolic (congestive) heart failure: Secondary | ICD-10-CM | POA: Diagnosis not present

## 2024-05-13 DIAGNOSIS — Z953 Presence of xenogenic heart valve: Secondary | ICD-10-CM | POA: Diagnosis not present

## 2024-05-13 DIAGNOSIS — R197 Diarrhea, unspecified: Secondary | ICD-10-CM | POA: Diagnosis not present

## 2024-05-13 DIAGNOSIS — I472 Ventricular tachycardia, unspecified: Secondary | ICD-10-CM | POA: Diagnosis not present

## 2024-05-13 DIAGNOSIS — I2782 Chronic pulmonary embolism: Secondary | ICD-10-CM | POA: Diagnosis not present

## 2024-05-13 DIAGNOSIS — Z87442 Personal history of urinary calculi: Secondary | ICD-10-CM

## 2024-05-13 DIAGNOSIS — E039 Hypothyroidism, unspecified: Secondary | ICD-10-CM | POA: Diagnosis present

## 2024-05-13 DIAGNOSIS — Z85048 Personal history of other malignant neoplasm of rectum, rectosigmoid junction, and anus: Secondary | ICD-10-CM | POA: Diagnosis present

## 2024-05-13 DIAGNOSIS — Z96653 Presence of artificial knee joint, bilateral: Secondary | ICD-10-CM | POA: Diagnosis present

## 2024-05-13 DIAGNOSIS — E119 Type 2 diabetes mellitus without complications: Secondary | ICD-10-CM

## 2024-05-13 DIAGNOSIS — R9431 Abnormal electrocardiogram [ECG] [EKG]: Secondary | ICD-10-CM | POA: Diagnosis present

## 2024-05-13 DIAGNOSIS — I82401 Acute embolism and thrombosis of unspecified deep veins of right lower extremity: Secondary | ICD-10-CM | POA: Diagnosis present

## 2024-05-13 DIAGNOSIS — I340 Nonrheumatic mitral (valve) insufficiency: Secondary | ICD-10-CM | POA: Diagnosis not present

## 2024-05-13 DIAGNOSIS — I2692 Saddle embolus of pulmonary artery without acute cor pulmonale: Secondary | ICD-10-CM | POA: Diagnosis present

## 2024-05-13 DIAGNOSIS — Z951 Presence of aortocoronary bypass graft: Secondary | ICD-10-CM | POA: Diagnosis not present

## 2024-05-13 DIAGNOSIS — I350 Nonrheumatic aortic (valve) stenosis: Secondary | ICD-10-CM | POA: Diagnosis present

## 2024-05-13 DIAGNOSIS — R0602 Shortness of breath: Secondary | ICD-10-CM | POA: Diagnosis not present

## 2024-05-13 DIAGNOSIS — N3281 Overactive bladder: Secondary | ICD-10-CM | POA: Diagnosis present

## 2024-05-13 DIAGNOSIS — J209 Acute bronchitis, unspecified: Secondary | ICD-10-CM | POA: Diagnosis present

## 2024-05-13 DIAGNOSIS — Z7984 Long term (current) use of oral hypoglycemic drugs: Secondary | ICD-10-CM | POA: Diagnosis not present

## 2024-05-13 DIAGNOSIS — Z1152 Encounter for screening for COVID-19: Secondary | ICD-10-CM

## 2024-05-13 DIAGNOSIS — R9389 Abnormal findings on diagnostic imaging of other specified body structures: Secondary | ICD-10-CM | POA: Diagnosis not present

## 2024-05-13 DIAGNOSIS — I2510 Atherosclerotic heart disease of native coronary artery without angina pectoris: Secondary | ICD-10-CM | POA: Diagnosis present

## 2024-05-13 DIAGNOSIS — Z860100 Personal history of colon polyps, unspecified: Secondary | ICD-10-CM

## 2024-05-13 DIAGNOSIS — I10 Essential (primary) hypertension: Secondary | ICD-10-CM | POA: Diagnosis present

## 2024-05-13 DIAGNOSIS — I509 Heart failure, unspecified: Secondary | ICD-10-CM | POA: Diagnosis not present

## 2024-05-13 DIAGNOSIS — I2722 Pulmonary hypertension due to left heart disease: Secondary | ICD-10-CM | POA: Diagnosis present

## 2024-05-13 DIAGNOSIS — I110 Hypertensive heart disease with heart failure: Secondary | ICD-10-CM | POA: Diagnosis not present

## 2024-05-13 DIAGNOSIS — Z86711 Personal history of pulmonary embolism: Secondary | ICD-10-CM

## 2024-05-13 DIAGNOSIS — I052 Rheumatic mitral stenosis with insufficiency: Secondary | ICD-10-CM | POA: Diagnosis present

## 2024-05-13 DIAGNOSIS — I2602 Saddle embolus of pulmonary artery with acute cor pulmonale: Secondary | ICD-10-CM | POA: Diagnosis not present

## 2024-05-13 LAB — RESPIRATORY PANEL BY PCR

## 2024-05-13 LAB — I-STAT VENOUS BLOOD GAS, ED
Acid-base deficit: 1 mmol/L (ref 0.0–2.0)
Bicarbonate: 27.6 mmol/L (ref 20.0–28.0)
Calcium, Ion: 1.22 mmol/L (ref 1.15–1.40)
HCT: 50 % (ref 39.0–52.0)
Hemoglobin: 17 g/dL (ref 13.0–17.0)
O2 Saturation: 69 %
Potassium: 4.5 mmol/L (ref 3.5–5.1)
Sodium: 138 mmol/L (ref 135–145)
TCO2: 29 mmol/L (ref 22–32)
pCO2, Ven: 58 mmHg (ref 44–60)
pH, Ven: 7.286 (ref 7.25–7.43)
pO2, Ven: 41 mmHg (ref 32–45)

## 2024-05-13 LAB — BASIC METABOLIC PANEL WITH GFR
Anion gap: 12 (ref 5–15)
BUN: 18 mg/dL (ref 8–23)
CO2: 25 mmol/L (ref 22–32)
Calcium: 9.2 mg/dL (ref 8.9–10.3)
Chloride: 101 mmol/L (ref 98–111)
Creatinine, Ser: 1.28 mg/dL — ABNORMAL HIGH (ref 0.61–1.24)
GFR, Estimated: 58 mL/min — ABNORMAL LOW (ref >60)
Glucose, Bld: 163 mg/dL — ABNORMAL HIGH (ref 70–99)
Potassium: 4.9 mmol/L (ref 3.5–5.1)
Sodium: 138 mmol/L (ref 135–145)

## 2024-05-13 LAB — CBC WITH DIFFERENTIAL/PLATELET
Abs Immature Granulocytes: 0.17 K/uL — ABNORMAL HIGH (ref 0.00–0.07)
Basophils Absolute: 0 K/uL (ref 0.0–0.1)
Basophils Relative: 0 %
Eosinophils Absolute: 0 K/uL (ref 0.0–0.5)
Eosinophils Relative: 0 %
HCT: 49.2 % (ref 39.0–52.0)
Hemoglobin: 15.3 g/dL (ref 13.0–17.0)
Immature Granulocytes: 1 %
Lymphocytes Relative: 4 %
Lymphs Abs: 0.7 K/uL (ref 0.7–4.0)
MCH: 29.6 pg (ref 26.0–34.0)
MCHC: 31.1 g/dL (ref 30.0–36.0)
MCV: 95.2 fL (ref 80.0–100.0)
Monocytes Absolute: 0.7 K/uL (ref 0.1–1.0)
Monocytes Relative: 4 %
Neutro Abs: 14.5 K/uL — ABNORMAL HIGH (ref 1.7–7.7)
Neutrophils Relative %: 91 %
Platelets: 178 K/uL (ref 150–400)
RBC: 5.17 MIL/uL (ref 4.22–5.81)
RDW: 15.9 % — ABNORMAL HIGH (ref 11.5–15.5)
WBC: 16.1 K/uL — ABNORMAL HIGH (ref 4.0–10.5)
nRBC: 0 % (ref 0.0–0.2)

## 2024-05-13 LAB — CBG MONITORING, ED: Glucose-Capillary: 230 mg/dL — ABNORMAL HIGH (ref 70–99)

## 2024-05-13 LAB — PRO BRAIN NATRIURETIC PEPTIDE: Pro Brain Natriuretic Peptide: 2462 pg/mL — ABNORMAL HIGH (ref <300.0)

## 2024-05-13 LAB — MAGNESIUM: Magnesium: 1.8 mg/dL (ref 1.7–2.4)

## 2024-05-13 LAB — TROPONIN T, HIGH SENSITIVITY
Troponin T High Sensitivity: 57 ng/L — ABNORMAL HIGH (ref 0–19)
Troponin T High Sensitivity: 52 ng/L — ABNORMAL HIGH (ref 0–19)

## 2024-05-13 MED ORDER — ALBUTEROL (5 MG/ML) CONTINUOUS INHALATION SOLN
10.0000 mg/h | INHALATION_SOLUTION | Freq: Once | RESPIRATORY_TRACT | Status: AC
  Administered 2024-05-13: 10 mg/h via RESPIRATORY_TRACT
  Filled 2024-05-13: qty 20

## 2024-05-13 MED ORDER — IPRATROPIUM-ALBUTEROL 0.5-2.5 (3) MG/3ML IN SOLN: 3.0000 mL | RESPIRATORY_TRACT | Status: DC

## 2024-05-13 MED ORDER — METHYLPREDNISOLONE SODIUM SUCC 125 MG IJ SOLR
125.0000 mg | Freq: Once | INTRAMUSCULAR | Status: AC
  Administered 2024-05-13: 125 mg via INTRAVENOUS
  Filled 2024-05-13: qty 2

## 2024-05-13 MED ORDER — FUROSEMIDE 10 MG/ML IJ SOLN
40.0000 mg | Freq: Once | INTRAMUSCULAR | Status: AC
  Administered 2024-05-13: 40 mg via INTRAVENOUS
  Filled 2024-05-13: qty 4

## 2024-05-13 MED ORDER — SODIUM CHLORIDE 0.9 % IV SOLN
1.0000 g | Freq: Once | INTRAVENOUS | Status: AC
  Administered 2024-05-13: 1 g via INTRAVENOUS
  Filled 2024-05-13: qty 10

## 2024-05-13 MED ORDER — IPRATROPIUM-ALBUTEROL 0.5-2.5 (3) MG/3ML IN SOLN
RESPIRATORY_TRACT | Status: AC
  Administered 2024-05-13: 3 mL
  Filled 2024-05-13: qty 6

## 2024-05-13 MED ORDER — ALBUTEROL SULFATE HFA 108 (90 BASE) MCG/ACT IN AERS
INHALATION_SPRAY | RESPIRATORY_TRACT | Status: AC
  Filled 2024-05-13: qty 6.7

## 2024-05-13 MED ORDER — ALBUTEROL SULFATE HFA 108 (90 BASE) MCG/ACT IN AERS
2.0000 | INHALATION_SPRAY | Freq: Once | RESPIRATORY_TRACT | Status: AC
  Administered 2024-05-13: 2 via RESPIRATORY_TRACT

## 2024-05-13 MED ORDER — SODIUM CHLORIDE 0.9 % IV BOLUS
500.0000 mL | Freq: Once | INTRAVENOUS | Status: AC
  Administered 2024-05-13: 500 mL via INTRAVENOUS

## 2024-05-13 MED ORDER — SODIUM CHLORIDE 0.9 % IV SOLN
500.0000 mg | Freq: Once | INTRAVENOUS | Status: AC
  Administered 2024-05-13: 500 mg via INTRAVENOUS
  Filled 2024-05-13: qty 5

## 2024-05-13 MED ORDER — IPRATROPIUM-ALBUTEROL 0.5-2.5 (3) MG/3ML IN SOLN
3.0000 mL | Freq: Once | RESPIRATORY_TRACT | Status: AC
Start: 2024-05-13 — End: 2024-05-13
  Administered 2024-05-13: 3 mL via RESPIRATORY_TRACT

## 2024-05-13 NOTE — H&P (Signed)
 History and Physical    RUSHTON EARLY FMW:[id number] DOB: Nov 10, 1945 DOA: 05/13/2024  Patient coming from: Home.  Chief Complaint: Shortness of breath.  HPI: Kirk [name] is a 78 y.o. 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  Patient had come to the ER about 3 days ago with shortness of breath with cough and wheezing.  At that time patient was diagnosed with possible pneumonia and bronchitis was discharged home on antibiotics and prednisone .  Due to worsening shortness of breath patient presents back to the ER.  Denies any chest pain abdominal pain.  ED Course: In the ER patient was acutely short of breath and had to be placed on BiPAP.  Chest x-ray shows features concerning for fluid overload was given Lasix  40 mg IV.  Troponin remained flat.  On exam patient is diffusely wheezing was placed on nebulizer therapy.  COVID flu test were negative respiratory panel was  negative 2 days ago.  On my exam patient is off BiPAP on 2 L oxygen.  Patient states he feels much better today received Lasix  and nebulizer.  proBNP was 2400 troponins were 57 and 52.  WBC count was 16.1.  Review of Systems: As per HPI, rest all negative.   Past Medical History:  Diagnosis Date   Allergy    Anal cancer (HCC) 04/2012   Aortic stenosis    valve replacement   Arthritis    1995- cerv. 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   CHF (congestive heart failure) (HCC)    Coronary artery disease    aortic stenosis, CAD   Diabetes mellitus    Type 2   DVT (deep venous thrombosis) (HCC)    ED (erectile dysfunction)    Elevated  cholesterol    Heart murmur    no problems   History of kidney stones    passed, lithrotrispy and surgery   History of radiation therapy 05/20/12-06/28/12   anal cancer=54gy total dose   Hypertension    Hypothyroidism    Kidney stones    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   Malignant neoplasm of other sites of rectum, rectosigmoid junction, and anus 04/12/2012   Myocardial infarction (HCC) 2012   Personal history of colonic polyps 03/20/2012   Pneumonia    Pulmonary embolism (HCC)    Rectal bleeding    Rectal mass 03/20/2012   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   #81mm [facility] Ease pericardial tissue valve    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   LIMA to diagonal branch    Seizure disorder, grand mal (HCC)    only 1   Seizures (HCC)    after taking Oxycodone ; not prescribed seizure med   Shortness of breath    Thrombosed hemorrhoids     Past Surgical History:  Procedure Laterality Date   ANAL FISSURE REPAIR     patient unsure of date   ANTERIOR CERVICAL DECOMP/DISCECTOMY FUSION N/A 01/22/2018   Procedure: ANTERIOR CERVICAL DECOMPRESSION/DISCECTOMY FUSION CERVICAL SIX- CERVICAL SEVEN;  Surgeon: Unice Pac, MD;  [facility] OR;  Service: Neurosurgery;  Laterality: N/A;  ANTERIOR  CERVICAL DECOMPRESSION/DISCECTOMY FUSION CERVICAL SIX- CERVICAL SEVEN   AORTIC VALVE REPLACEMENT  08/11/2011   Procedure: AORTIC VALVE REPLACEMENT (AVR);  Surgeon: Sudie VEAR Laine, MD;  Location: [facility] OR;  Service: Open Heart Surgery;  Laterality: N/A;   C4-6 FUSION  1995   DR STERN   COLONOSCOPY     hx polyps   CORONARY ARTERY BYPASS GRAFT  08/16/2011   Procedure: CORONARY ARTERY BYPASS GRAFTING (CABG);  Surgeon: Sudie VEAR Laine, MD;  Location: [facility] OR;  Service: Open Heart Surgery;  Laterality: N/A;  CABG times one using left internal mammary artery   LITHOTRIPSY  2001 AND 2002   DR PETERSON   POLYPECTOMY     colon   TOTAL KNEE ARTHROPLASTY Right 06/18/2023   Procedure: RIGHT  TOTAL KNEE ARTHROPLASTY;  Surgeon: Melodi Lerner, MD;  Location: [facility] ORS;  Service: Orthopedics;  Laterality: Right;   TOTAL KNEE ARTHROPLASTY Left 11/05/2023   Procedure: ARTHROPLASTY, KNEE, TOTAL;  Surgeon: Melodi Lerner, MD;  Location: [facility] ORS;  Service: Orthopedics;  Laterality: Left;     reports that he has never smoked. He has never been exposed to tobacco smoke. He has never used smokeless tobacco. He reports current alcohol  use. He reports that he does not use drugs.  Allergies  Allergen Reactions   Cat Dander Shortness Of Breath and Swelling   Hydrocodone  Other (See Comments)    Seizures    Atorvastatin  Other (See Comments)    Joint pain   Dust Mite Extract Other (See Comments) and Swelling   Metformin Other (See Comments)    Diarrhea, muscle cramps   Oxycodone  Other (See Comments)    Seizure PATIENT DOES NOT WANT ANY NARCOTICS   Semaglutide (0.25 Or 0.5mg -Dos) Nausea Only    AT HIGHER DOSE    Family History  Problem Relation Age of Onset   Breast cancer Mother    Heart attack Father    Esophageal cancer Maternal Grandfather    Anesthesia problems Neg Hx    Hypotension Neg Hx    Malignant hyperthermia Neg Hx    Pseudochol deficiency Neg Hx    Colon cancer Neg Hx    Colon polyps Neg Hx    Stomach cancer Neg Hx    Rectal cancer Neg Hx     Prior to Admission medications   Medication Sig Start Date End Date Taking? Authorizing Provider  apixaban  (ELIQUIS ) 2.5 MG TABS tablet Take 1 tablet by mouth twice daily 03/17/24  Yes Cloretta Arley NOVAK, MD  aspirin  EC 81 MG tablet Take 81 mg by mouth daily.   Yes [provider]  benzonatate  (TESSALON ) 100 MG capsule Take 1 capsule (100 mg total) by mouth every 8 (eight) hours. 05/10/24  Yes Nivia Colon, PA-C  DULoxetine  (CYMBALTA ) 60 MG capsule Take 1 capsule by mouth at bedtime 04/23/24  Yes [name], Raymond J, MD  FARXIGA  5 MG TABS tablet Take 1 tablet by mouth once daily 04/23/24  Yes [name], [name], MD  levothyroxine   (SYNTHROID ) 50 MCG tablet Take 1 tablet (50 mcg total) by mouth daily before breakfast. 02/05/24  Yes [name], Raymond J, MD  losartan  (COZAAR ) 50 MG tablet Take 1 tablet by mouth once daily 04/23/24  Yes [name], [name], MD  metoprolol  tartrate (LOPRESSOR ) 25 MG tablet Take 1 tablet by mouth twice daily 03/17/24  Yes [name], [name], MD  MYRBETRIQ  25 MG TB24 tablet Take 1 tablet (25 mg total) by mouth daily. 06/14/23  Yes [name], Raymond  J, MD  OZEMPIC , 0.25 OR 0.5 MG/DOSE, 2 MG/3ML SOPN INJECT 0.25MG   SUBCUTANEOUSLY ONCE A WEEK 05/06/24  Yes [name], Raymond J, MD  predniSONE  (DELTASONE ) 20 MG tablet 3 tabs po day one, then 2 tabs daily x 4 days 05/10/24  Yes Nivia Colon, PA-C  rosuvastatin  (CRESTOR ) 40 MG tablet TAKE 1 TABLET BY MOUTH AT BEDTIME 03/25/24  Yes [name], Raymond J, MD  tadalafil  (CIALIS ) 20 MG tablet Take 20 mg by mouth daily as needed for erectile dysfunction. 03/20/24 06/18/24 Yes [provider]  triamcinolone  cream (KENALOG ) 0.1 % APPLY CREAM EXTERNALLY TO AFFECTED AREA TWICE DAILY AS NEEDED FOR ITCHING   Yes [provider]  Vitamins-Lipotropics (LIPO-FLAVONOID PLUS) TABS Take 1 tablet by mouth daily.   Yes [provider]  zinc sulfate, 50mg  elemental zinc, 220 (50 Zn) MG capsule Take 220 mg by mouth daily.   Yes [provider]  HYDROmorphone  (DILAUDID ) 2 MG tablet Take 0.5-1 tablets (1-2 mg total) by mouth every 8 (eight) hours as needed for severe pain (pain score 7-10). Patient not taking: Reported on 05/13/2024 11/06/23   Edmisten, Roxie L, PA  methocarbamol  (ROBAXIN ) 500 MG tablet Take 1 tablet (500 mg total) by mouth every 6 (six) hours as needed for muscle spasms. Patient not taking: Reported on 05/13/2024 11/06/23   Edmisten, Roxie L, PA  NEEDLE, DISP, 22 G 22G X 1-1/2 MISC 1 each by Does not apply route once a week. Use to administer medication. 02/28/23   [name], [name], MD  ondansetron  (ZOFRAN ) 4 MG tablet Take 1 tablet (4 mg total)  by mouth every 6 (six) hours as needed for nausea. Patient not taking: Reported on 05/13/2024 11/06/23   Edmisten, Roxie L, PA  traMADol  (ULTRAM ) 50 MG tablet Take 1-2 tablets (50-100 mg total) by mouth every 6 (six) hours as needed for moderate pain (pain score 4-6). Patient not taking: Reported on 05/13/2024 11/06/23 11/05/24  Reena Roxie CROME, PA    Physical Exam: Constitutional: Moderately built and nourished. Vitals:   05/13/24 2115 05/13/24 2120 05/13/24 2122 05/13/24 2229  BP: (!) 97/56  (!) 97/56 (!) 112/93  Pulse: 84  85 (!) 109  Resp: 20  (!) 22 (!) 24  Temp:   97.6 F (36.4 C) 98.4 F (36.9 C)  TempSrc:   Axillary Oral  SpO2: 98% 96% 98% 95%  Weight:    101.8 kg  Height:    5' 11 (1.803 m)   Eyes: Anicteric no pallor. ENMT: No discharge from the ears eyes nose or mouth. Neck: No mass felt.  No neck rigidity. Respiratory: Bilateral expiratory wheezes heard. Cardiovascular: S1 S2 heard. Abdomen: Soft nontender bowel sound present. Musculoskeletal: No edema. Skin: No rash. Neurologic: Alert awake oriented to time place and person.  Moves all extremities. Psychiatric: Appears normal.  Normal affect.   Labs on Admission: I have personally reviewed following labs and imaging studies  CBC: Recent Labs  Lab 05/10/24 0916 05/13/24 1425 05/13/24 1435  WBC 6.5 16.1*  --   NEUTROABS 4.7 14.5*  --   HGB 15.0 15.3 17.0  HCT 47.0 49.2 50.0  MCV 94.4 95.2  --   PLT 145* 178  --    Basic Metabolic Panel: Recent Labs  Lab 05/10/24 0916 05/13/24 1425 05/13/24 1435 05/13/24 1708  NA 140 138 138  --   K 4.5 4.9 4.5  --   CL 105 101  --   --   CO2 23 25  --   --  GLUCOSE 126* 163*  --   --   BUN 11 18  --   --   CREATININE 1.11 1.28*  --   --   CALCIUM  9.1 9.2  --   --   MG  --   --   --  1.8   GFR: Estimated Creatinine Clearance: 59.7 mL/min (A) (by C-G formula based on SCr of 1.28 mg/dL (H)). Liver Function Tests: No results for input(s): AST, ALT,  ALKPHOS, BILITOT, PROT, ALBUMIN  in the last 168 hours. No results for input(s): LIPASE, AMYLASE in the last 168 hours. No results for input(s): AMMONIA in the last 168 hours. Coagulation Profile: No results for input(s): INR, PROTIME in the last 168 hours. Cardiac Enzymes: No results for input(s): CKTOTAL, CKMB, CKMBINDEX, TROPONINI in the last 168 hours. BNP (last 3 results) Recent Labs    05/10/24 0916 05/13/24 1425  PROBNP 1,252.0* 2,462.0*   HbA1C: No results for input(s): HGBA1C in the last 72 hours. CBG: Recent Labs  Lab 05/13/24 1606  GLUCAP 230*   Lipid Profile: No results for input(s): CHOL, HDL, LDLCALC, TRIG, CHOLHDL, LDLDIRECT in the last 72 hours. Thyroid  Function Tests: No results for input(s): TSH, T4TOTAL, FREET4, T3FREE, THYROIDAB in the last 72 hours. Anemia Panel: No results for input(s): VITAMINB12, FOLATE, FERRITIN, TIBC, IRON, RETICCTPCT in the last 72 hours. Urine analysis:    Component Value Date/Time   COLORURINE STRAW (A) 01/08/2018 1349   APPEARANCEUR CLEAR 01/08/2018 1349   LABSPEC 1.009 01/08/2018 1349   LABSPEC 1.010 10/09/2013 0811   PHURINE 5.0 01/08/2018 1349   GLUCOSEU NEGATIVE 01/08/2018 1349   GLUCOSEU Negative 10/09/2013 0811   HGBUR NEGATIVE 01/08/2018 1349   BILIRUBINUR NEGATIVE 01/08/2018 1349   BILIRUBINUR Negative 10/09/2013 0811   KETONESUR NEGATIVE 01/08/2018 1349   PROTEINUR NEGATIVE 01/08/2018 1349   UROBILINOGEN 0.2 10/09/2013 0811   NITRITE NEGATIVE 01/08/2018 1349   LEUKOCYTESUR NEGATIVE 01/08/2018 1349   LEUKOCYTESUR Negative 10/09/2013 0811   Sepsis Labs: @LABRCNTIP (procalcitonin:4,lacticidven:4) ) Recent Results (from the past 240 hours)  Resp panel by RT-PCR (RSV, Flu A&B, Covid) Anterior Nasal Swab     Status: None   Collection Time: 05/10/24  9:16 AM   Specimen: Anterior Nasal Swab  Result Value Ref Range Status   SARS Coronavirus 2 by RT PCR  NEGATIVE NEGATIVE Final    Comment: (NOTE) SARS-CoV-2 target nucleic acids are NOT DETECTED.  The SARS-CoV-2 RNA is generally detectable in upper respiratory specimens during the acute phase of infection. The lowest concentration of SARS-CoV-2 viral copies this assay can detect is 138 copies/mL. A negative result does not preclude SARS-Cov-2 infection and should not be used as the sole basis for treatment or other patient management decisions. A negative result may occur with  improper specimen collection/handling, submission of specimen other than nasopharyngeal swab, presence of viral mutation(s) within the areas targeted by this assay, and inadequate number of viral copies(<138 copies/mL). A negative result must be combined with clinical observations, patient history, and epidemiological information. The expected result is Negative.  Fact Sheet for Patients:  BloggerCourse.com  Fact Sheet for Healthcare Providers:  SeriousBroker.it  This test is no t yet approved or cleared by the [place]  FDA and  has been authorized for detection and/or diagnosis of SARS-CoV-2 by FDA under an Emergency Use Authorization (EUA). This EUA will remain  in effect (meaning this test can be used) for the duration of the COVID-19 declaration under Section 564(b)(1) of the Act, 21 U.S.C.section 360bbb-3(b)(1), unless the  authorization is terminated  or revoked sooner.       Influenza A by PCR NEGATIVE NEGATIVE Final   Influenza B by PCR NEGATIVE NEGATIVE Final    Comment: (NOTE) The Xpert Xpress SARS-CoV-2/FLU/RSV plus assay is intended as an aid in the diagnosis of influenza from Nasopharyngeal swab specimens and should not be used as a sole basis for treatment. Nasal washings and aspirates are unacceptable for Xpert Xpress SARS-CoV-2/FLU/RSV testing.  Fact Sheet for Patients: BloggerCourse.com  Fact Sheet for  Healthcare Providers: SeriousBroker.it  This test is not yet approved or cleared by the [place]  FDA and has been authorized for detection and/or diagnosis of SARS-CoV-2 by FDA under an Emergency Use Authorization (EUA). This EUA will remain in effect (meaning this test can be used) for the duration of the COVID-19 declaration under Section 564(b)(1) of the Act, 21 U.S.C. section 360bbb-3(b)(1), unless the authorization is terminated or revoked.     Resp Syncytial Virus by PCR NEGATIVE NEGATIVE Final    Comment: (NOTE) Fact Sheet for Patients: BloggerCourse.com  Fact Sheet for Healthcare Providers: SeriousBroker.it  This test is not yet approved or cleared by the [place]  FDA and has been authorized for detection and/or diagnosis of SARS-CoV-2 by FDA under an Emergency Use Authorization (EUA). This EUA will remain in effect (meaning this test can be used) for the duration of the COVID-19 declaration under Section 564(b)(1) of the Act, 21 U.S.C. section 360bbb-3(b)(1), unless the authorization is terminated or revoked.  Performed at [facility], [address] [place], [place] [address]   Respiratory (~20 pathogens) panel by PCR     Status: None   Collection Time: 05/13/24  2:25 PM   Specimen: Nasopharyngeal Swab; Respiratory  Result Value Ref Range Status   Adenovirus NOT DETECTED NOT DETECTED Final   Coronavirus 229E NOT DETECTED NOT DETECTED Final    Comment: (NOTE) The Coronavirus on the Respiratory Panel, DOES NOT test for the novel  Coronavirus (2019 nCoV)    Coronavirus HKU1 NOT DETECTED NOT DETECTED Final   Coronavirus NL63 NOT DETECTED NOT DETECTED Final   Coronavirus OC43 NOT DETECTED NOT DETECTED Final   Metapneumovirus NOT DETECTED NOT DETECTED Final   Rhinovirus / Enterovirus NOT DETECTED NOT DETECTED Final   Influenza A NOT DETECTED NOT DETECTED Final    Influenza B NOT DETECTED NOT DETECTED Final   Parainfluenza Virus 1 NOT DETECTED NOT DETECTED Final   Parainfluenza Virus 2 NOT DETECTED NOT DETECTED Final   Parainfluenza Virus 3 NOT DETECTED NOT DETECTED Final   Parainfluenza Virus 4 NOT DETECTED NOT DETECTED Final   Respiratory Syncytial Virus NOT DETECTED NOT DETECTED Final   Bordetella pertussis NOT DETECTED NOT DETECTED Final   Bordetella Parapertussis NOT DETECTED NOT DETECTED Final   Chlamydophila pneumoniae NOT DETECTED NOT DETECTED Final   Mycoplasma pneumoniae NOT DETECTED NOT DETECTED Final    Comment: Performed at [facility], [address] [place], [place] [address]     Radiological Exams on Admission: DG Chest 2 View Result Date: 05/13/2024 CLINICAL DATA:  sob, wheezing and crackles. EXAM: CHEST - 2 VIEW COMPARISON:  05/10/2024. FINDINGS: Low lung volume. Mild to moderate diffuse pulmonary vascular congestion. Bilateral lung fields are otherwise clear. No dense consolidation or lung collapse. There bilateral small pleural effusions, blunting the costophrenic angles. Note is made of elevated right hemidiaphragm. Stable cardio-mediastinal silhouette. There are surgical staples along the heart border and sternotomy wires, status post CABG (coronary artery bypass graft). No acute  osseous abnormalities. Lower cervical spinal fixation hardware noted. The soft tissues are within normal limits. IMPRESSION: Findings favor congestive heart failure/pulmonary edema. Electronically Signed   By: Ree Molt M.D.   On: 05/13/2024 16:16    EKG: Independently reviewed.  Normal sinus rhythm.  Assessment/Plan Principal Problem:   Acute hypoxemic respiratory failure (HCC)    Acute hypoxic respiratory failure was on BiPAP improved after Lasix  IV and nebulizer.  Differentials include acute CHF versus acute bronchitis with pneumonia.  Will continue nebulizer scheduled and as needed for now.  Continue antibiotics follow cultures  procalcitonin level 2D echo.  Patient did receive 1 dose of Lasix  further doses to be decided.  Will consult cardiology. CAD status post CABG denies any chest pain troponins are flat.  Will continue with Eliquis  statins beta-blockers. Bioprosthetic aortic valve replacement.  Check 2D echo. History of PE on Eliquis . History of hypertension will hold ARB due to low normal blood pressure.  Continue beta-blockers. Acute renal failure follow metabolic panel closely. Diabetes mellitus type 2 takes Farxiga  and Ozempic .  Check hemoglobin A1c.  On sliding scale coverage. Hypothyroidism on Synthroid .  Since patient has acute respiratory failure will need close monitoring further workup and more than 2 midnight stay.   DVT prophylaxis: Eliquis . Code Status: Full code. Family Communication: Wife at the bedside. Disposition Plan: Monitored bed. Consults called: Cardiology. Admission status: Inpatient.

## 2024-05-13 NOTE — ED Provider Notes (Addendum)
 [place] EMERGENCY DEPARTMENT AT [facility] Provider Note   CSN: [id number] Arrival date & time: 05/13/24  1238     Patient presents with: Shortness of Breath   Kirk [name] is a 78 y.o. male.    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  The patient was seen on Saturday 9/6 during which time he had wheezing and cough productive with small amount of sputum.  He had been compliant with his Eliquis .  Workup had been reassuring in the emergency department and he was administered nebulizer treatments and discharged on prednisone  and Tessalon  and took albuterol  inhaler as needed.  He initially started to feel symptomatically improved however over the last 2 days he has felt worsening symptoms of dyspnea.  He denies any chest pain, endorses worsening shortness of breath, continued cough productive of sputum.  No lower extremity swelling.  He has been compliant with his home medications.  Prior to Admission medications   Medication Sig Start Date End Date Taking? Authorizing Provider  apixaban  (ELIQUIS ) 2.5 MG TABS tablet Take 1 tablet by mouth twice daily 03/17/24   Cloretta Arley NOVAK, MD  aspirin  EC 81 MG tablet Take 81 mg by mouth daily.    [provider]  benzonatate  (TESSALON ) 100 MG capsule Take 1 capsule (100 mg total) by mouth every 8 (eight) hours. 05/10/24   Nivia Colon, PA-C  DULoxetine  (CYMBALTA ) 60 MG capsule Take 1 capsule by mouth at bedtime 04/23/24   [name], [name], MD  FARXIGA  5 MG TABS tablet Take 1 tablet by mouth once daily 04/23/24   [name], [name], MD  HYDROmorphone  (DILAUDID ) 2 MG tablet Take 0.5-1 tablets (1-2 mg total) by mouth every 8 (eight) hours as needed for severe pain (pain score 7-10). 11/06/23   Edmisten, Roxie CROME, PA   levothyroxine  (SYNTHROID ) 50 MCG tablet Take 1 tablet (50 mcg total) by mouth daily before breakfast. 02/05/24   [name], [name], MD  losartan  (COZAAR ) 50 MG tablet Take 1 tablet by mouth once daily 04/23/24   [name], [name], MD  methocarbamol  (ROBAXIN ) 500 MG tablet Take 1 tablet (500 mg total) by mouth every 6 (six) hours as needed for muscle spasms. 11/06/23   Edmisten, Kristie L, PA  metoprolol  tartrate (LOPRESSOR ) 25 MG tablet Take 1 tablet by mouth twice daily 03/17/24   [name], [name], MD  MYRBETRIQ  25 MG TB24 tablet Take 1 tablet (25 mg total) by mouth daily. 06/14/23   [name], Raymond J, MD  NEEDLE, DISP, 22 G 22G X 1-1/2 MISC 1 each by Does not apply route once a week. Use to administer medication. 02/28/23   [name], [name], MD  ondansetron  (ZOFRAN ) 4 MG tablet Take 1 tablet (4 mg total) by mouth every 6 (six) hours as needed for nausea. 11/06/23   Edmisten, Kristie L, PA  OZEMPIC , 0.25 OR 0.5 MG/DOSE, 2 MG/3ML SOPN INJECT 0.25MG   SUBCUTANEOUSLY ONCE A WEEK 05/06/24   [name], [name], MD  predniSONE  (DELTASONE ) 20 MG tablet 3 tabs po day one, then 2 tabs daily x 4 days 05/10/24   Nivia Colon, PA-C  rosuvastatin  (CRESTOR ) 40 MG tablet TAKE 1 TABLET BY MOUTH AT BEDTIME 03/25/24   [name], [name], MD  traMADol  (ULTRAM ) 50 MG tablet Take 1-2 tablets (50-100 mg total) by mouth every 6 (six) hours as needed for moderate pain (pain score 4-6). 11/06/23 11/05/24  Edmisten, Kristie L, PA  triamcinolone  cream (KENALOG ) 0.1 % APPLY CREAM EXTERNALLY TO AFFECTED AREA TWICE DAILY AS NEEDED FOR ITCHING    [provider]  Vitamins-Lipotropics (LIPO-FLAVONOID PLUS) TABS Take 1 tablet by mouth daily.    [provider]    Allergies: Cat dander, Hydrocodone , Atorvastatin , Dust mite extract, Metformin, Oxycodone , and Semaglutide (0.25 or 0.5mg -dos)    Review of Systems  Respiratory:  Positive for cough, shortness of breath and wheezing.   All other systems reviewed and are  negative.   Updated Vital Signs BP 112/81 (BP Location: Right Arm)   Pulse 100   Temp (!) 97.4 F (36.3 C) (Oral)   Resp 19   SpO2 97%   Physical Exam Vitals and nursing note reviewed.  Constitutional:      General: He is not in acute distress.    Appearance: He is well-developed.  HENT:     Head: Normocephalic and atraumatic.  Eyes:     Conjunctiva/sclera: Conjunctivae normal.  Neck:     Vascular: No JVD.  Cardiovascular:     Rate and Rhythm: Normal rate and regular rhythm.     Heart sounds: No murmur heard. Pulmonary:     Effort: Pulmonary effort is normal. Tachypnea present. No respiratory distress.     Breath sounds: Wheezing and rhonchi present.  Abdominal:     Palpations: Abdomen is soft.     Tenderness: There is no abdominal tenderness.  Musculoskeletal:        General: No swelling.     Cervical back: Neck supple.  Skin:    General: Skin is warm and dry.     Capillary Refill: Capillary refill takes less than 2 seconds.  Neurological:     Mental Status: He is alert.  Psychiatric:        Mood and Affect: Mood normal.     (all labs ordered are listed, but only abnormal results are displayed) Labs Reviewed  BASIC METABOLIC PANEL WITH GFR - Abnormal; Notable for the following components:      Result Value   Glucose, Bld 163 (*)    Creatinine, Ser 1.28 (*)    GFR, Estimated 58 (*)    All other components within normal limits  CBC WITH DIFFERENTIAL/PLATELET - Abnormal; Notable for the following components:   WBC 16.1 (*)    RDW 15.9 (*)    Neutro Abs 14.5 (*)    Abs Immature Granulocytes 0.17 (*)    All other components within normal limits  PRO BRAIN NATRIURETIC PEPTIDE - Abnormal; Notable for the following components:   Pro Brain Natriuretic Peptide 2,462.0 (*)    All other components within normal limits  CBG MONITORING, ED - Abnormal; Notable for the following components:   Glucose-Capillary 230 (*)    All other components within normal limits   TROPONIN T, HIGH SENSITIVITY - Abnormal; Notable for the following components:   Troponin T High Sensitivity 57 (*)    All other components within normal limits  RESPIRATORY PANEL BY PCR  I-STAT VENOUS BLOOD GAS, ED  TROPONIN T, HIGH SENSITIVITY    EKG: EKG Interpretation Date/Time:  Tuesday May 13 2024 12:46:38 EDT Ventricular Rate:  71 PR Interval:  61 QRS Duration:  104 QT Interval:  359 QTC Calculation: 391 R Axis:   105  Text Interpretation: Sinus or ectopic  atrial rhythm Short PR interval Anterior infarct, old Repol abnrm suggests ischemia, lateral leads No significant change since last tracing Confirmed by Jerrol Agent ([id number]) on 05/13/2024 1:53:36 PM  Radiology: No results found.   .Critical Care  Performed by: Jerrol Agent, MD Authorized by: Jerrol Agent, MD   Critical care provider statement:    Critical care time (minutes):  40   Critical care was necessary to treat or prevent imminent or life-threatening deterioration of the following conditions:  Respiratory failure   Critical care was time spent personally by me on the following activities:  Development of treatment plan with patient or surrogate, discussions with consultants, evaluation of patient's response to treatment, examination of patient, ordering and review of laboratory studies, ordering and review of radiographic studies, ordering and performing treatments and interventions, pulse oximetry, re-evaluation of patient's condition and review of old charts    Medications Ordered in the ED  albuterol  (VENTOLIN  HFA) 108 (90 Base) MCG/ACT inhaler (  Not Given 05/13/24 1258)  albuterol  (VENTOLIN  HFA) 108 (90 Base) MCG/ACT inhaler 2 puff (2 puffs Inhalation Given 05/13/24 1249)  ipratropium-albuterol  (DUONEB) 0.5-2.5 (3) MG/3ML nebulizer solution 3 mL (3 mLs Nebulization Given 05/13/24 1301)  ipratropium-albuterol  (DUONEB) 0.5-2.5 (3) MG/3ML nebulizer solution (3 mLs  Given 05/13/24 1306)  methylPREDNISolone   sodium succinate (SOLU-MEDROL ) 125 mg/2 mL injection 125 mg (125 mg Intravenous Given 05/13/24 1427)  albuterol  (PROVENTIL ,VENTOLIN ) solution continuous neb (10 mg/hr Nebulization Given 05/13/24 1409)  furosemide  (LASIX ) injection 40 mg (40 mg Intravenous Given 05/13/24 1531)                                    Medical Decision Making Amount and/or Complexity of Data Reviewed Labs: ordered. Radiology: ordered.  Risk Prescription drug management. Decision regarding hospitalization.     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  The patient was seen on Saturday 9/6 during which time he had wheezing and cough productive with small amount of sputum.  He had been compliant with his Eliquis .  Workup had been reassuring in the emergency department and he was administered nebulizer treatments and discharged on prednisone  and Tessalon  and took albuterol  inhaler as needed.  He initially started to feel symptomatically improved however over the last 2 days he has felt worsening symptoms of dyspnea.  He denies any chest pain, endorses worsening shortness of breath, continued cough productive of sputum.  No lower extremity swelling.  He has been compliant with his home medications.  On arrival, the patient was afebrile, not tachycardic heart rate 70, mildly tachypneic RR 23, BP 133/81, saturations dropped down into the 80s on room air in triage and the patient was subsequently placed on 5 L O2 via nasal cannula.  On exam the patient was tachypneic in the 20s, had mixed wheezing and rhonchi present.  No lower extremity edema and no JVD appreciated.  Considered worsening viral infection, pneumonia, undiagnosed COPD, CHF exacerbation, less likely ACS, PE, pneumothorax.  Patient due to persistent wheezing was administered nebulizers.  Initial EKG revealed sinus  rhythm, ventricular rate 71, no evidence of STEMI.  Chest x-ray was obtained and results were pending at signout.  Laboratory evaluation was concerning for worsening BNP with a BNP of 2462 which is now elevated for proBNP given the patient's age, cardiac troponin  57, repeat pending.  Respiratory panel was ordered and pending.  CBC revealed a leukocytosis to 16.1 (patient has been on prednisone  outpatient), BMP with blood glucose 163, no significant electrolyte abnormality, no significant acidosis.  Plan at time of signout to reassess the patient, plan pending chest x-ray results to admit the patient for further management.  Signout given to Dr. Pamella at [phone], ultimate disposition with plan for admission for acute hypoxic respiratory failure, 40mg  IV Lasix  administered.  Patient and family updated bedside.     Final diagnoses:  Acute hypoxic respiratory failure [facility])    ED Discharge Orders     None          Jerrol Agent, MD 05/13/24 1612    Jerrol Agent, MD 05/13/24 1615    Jerrol Agent, MD 05/13/24 1715

## 2024-05-13 NOTE — Progress Notes (Signed)
 Patient adm. Via [facility] alert and orin. Patient orint. To room and no pain. Arland R.N. into assess patient.

## 2024-05-13 NOTE — ED Triage Notes (Signed)
 Patient reports has been sick for several days. Was seen here Saturday and states he was diagnosed with bronchitis. Hx of CHF. Increasing shortness of breath today.

## 2024-05-13 NOTE — Progress Notes (Signed)
 Admit request: Call [facility] at [phone] re: Pt Kirk [name], [place]. #[id number] / Room: [facility].  Provider MD: Dr.Penna.  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 Coming to [facility] for acute respiratory failure with hypoxia. SpO2: 97 % O2 Flow Rate (L/min): 5 L/min FiO2 (%): 30 %  Initial VBG is within normal limits. CHF exacerbation.  Elevated BNP 20 2460. Mag ordered and pending.  Pt accepted to progressive unit for acute respiratory failure with hypoxia.

## 2024-05-13 NOTE — ED Provider Notes (Signed)
  Physical Exam  BP 112/81 (BP Location: Right Arm)   Pulse 100   Temp (!) 97.4 F (36.3 C) (Oral)   Resp 19   SpO2 97%   Physical Exam Vitals and nursing note reviewed.  HENT:     Head: Normocephalic and atraumatic.  Eyes:     Pupils: Pupils are equal, round, and reactive to light.  Cardiovascular:     Rate and Rhythm: Normal rate and regular rhythm.  Pulmonary:     Effort: Pulmonary effort is normal.     Breath sounds: Normal breath sounds.  Abdominal:     Palpations: Abdomen is soft.     Tenderness: There is no abdominal tenderness.  Skin:    General: Skin is warm and dry.  Neurological:     Mental Status: He is alert.  Psychiatric:        Mood and Affect: Mood normal.     Procedures  Procedures  ED Course / MDM   Clinical Course as of 05/13/24 1736  Tue May 13, 2024  1734 Most likely etiology would be heart failure exacerbation considering doubling of proBNP in the last couple days and increase pulmonary edema on chest x-ray.  Patient has remained tachypneic on 5 L nasal cannula.  Transitioned to BiPAP with improvement in respiratory effort and comfort.  Discussed with Dr. Tobie (hospitalist) who accepts patient for admission [MP]    Clinical Course User Index [MP] Pamella Ozell LABOR, DO   Medical Decision Making I, Ozell Pamella DO, have assumed care of this patient from the previous provider pending remainder of ED workup, reevaluation of respiratory status and admission  Amount and/or Complexity of Data Reviewed Labs: ordered. Radiology: ordered.  Risk Prescription drug management. Decision regarding hospitalization.          Pamella Ozell LABOR, DO 05/13/24 1736

## 2024-05-14 ENCOUNTER — Encounter (HOSPITAL_COMMUNITY): Payer: Self-pay | Admitting: Internal Medicine

## 2024-05-14 ENCOUNTER — Inpatient Hospital Stay (HOSPITAL_COMMUNITY)

## 2024-05-14 DIAGNOSIS — J9601 Acute respiratory failure with hypoxia: Secondary | ICD-10-CM | POA: Diagnosis not present

## 2024-05-14 DIAGNOSIS — I5031 Acute diastolic (congestive) heart failure: Secondary | ICD-10-CM | POA: Diagnosis not present

## 2024-05-14 DIAGNOSIS — I509 Heart failure, unspecified: Secondary | ICD-10-CM

## 2024-05-14 DIAGNOSIS — I5033 Acute on chronic diastolic (congestive) heart failure: Secondary | ICD-10-CM | POA: Diagnosis not present

## 2024-05-14 DIAGNOSIS — J209 Acute bronchitis, unspecified: Secondary | ICD-10-CM | POA: Insufficient documentation

## 2024-05-14 LAB — CBC WITH DIFFERENTIAL/PLATELET
Abs Immature Granulocytes: 0.22 K/uL — ABNORMAL HIGH (ref 0.00–0.07)
Basophils Absolute: 0 K/uL (ref 0.0–0.1)
Basophils Relative: 0 %
Eosinophils Absolute: 0.1 K/uL (ref 0.0–0.5)
Eosinophils Relative: 0 %
HCT: 46.3 % (ref 39.0–52.0)
Hemoglobin: 14.4 g/dL (ref 13.0–17.0)
Immature Granulocytes: 1 %
Lymphocytes Relative: 4 %
Lymphs Abs: 0.8 K/uL (ref 0.7–4.0)
MCH: 29.9 pg (ref 26.0–34.0)
MCHC: 31.1 g/dL (ref 30.0–36.0)
MCV: 96.1 fL (ref 80.0–100.0)
Monocytes Absolute: 1.3 K/uL — ABNORMAL HIGH (ref 0.1–1.0)
Monocytes Relative: 7 %
Neutro Abs: 17.1 K/uL — ABNORMAL HIGH (ref 1.7–7.7)
Neutrophils Relative %: 88 %
Platelets: 163 K/uL (ref 150–400)
RBC: 4.82 MIL/uL (ref 4.22–5.81)
RDW: 16 % — ABNORMAL HIGH (ref 11.5–15.5)
WBC: 19.5 K/uL — ABNORMAL HIGH (ref 4.0–10.5)
nRBC: 0 % (ref 0.0–0.2)

## 2024-05-14 LAB — BASIC METABOLIC PANEL WITH GFR
Anion gap: 15 (ref 5–15)
BUN: 25 mg/dL — ABNORMAL HIGH (ref 8–23)
CO2: 22 mmol/L (ref 22–32)
Calcium: 8.7 mg/dL — ABNORMAL LOW (ref 8.9–10.3)
Chloride: 103 mmol/L (ref 98–111)
Creatinine, Ser: 1.43 mg/dL — ABNORMAL HIGH (ref 0.61–1.24)
GFR, Estimated: 51 mL/min — ABNORMAL LOW (ref >60)
Glucose, Bld: 153 mg/dL — ABNORMAL HIGH (ref 70–99)
Potassium: 5.2 mmol/L — ABNORMAL HIGH (ref 3.5–5.1)
Sodium: 140 mmol/L (ref 135–145)

## 2024-05-14 LAB — ECHOCARDIOGRAM COMPLETE
AR max vel: 1.37 cm2
AV Area VTI: 1.36 cm2
AV Area mean vel: 1.43 cm2
AV Mean grad: 14 mmHg
AV Peak grad: 27.2 mmHg
Ao pk vel: 2.61 m/s
Area-P 1/2: 3.99 cm2
Calc EF: 71.3 %
Height: 71 in
MV VTI: 1.56 cm2
S' Lateral: 3.2 cm
Single Plane A2C EF: 74.7 %
Single Plane A4C EF: 67.3 %
Weight: 3592 [oz_av]

## 2024-05-14 LAB — HEPATIC FUNCTION PANEL
ALT: 25 U/L (ref 0–44)
AST: 35 U/L (ref 15–41)
Albumin: 3.7 g/dL (ref 3.5–5.0)
Alkaline Phosphatase: 61 U/L (ref 38–126)
Bilirubin, Direct: 0.3 mg/dL — ABNORMAL HIGH (ref 0.0–0.2)
Indirect Bilirubin: 0.9 mg/dL (ref 0.3–0.9)
Total Bilirubin: 1.2 mg/dL (ref 0.0–1.2)
Total Protein: 7 g/dL (ref 6.5–8.1)

## 2024-05-14 LAB — LACTIC ACID, PLASMA
Lactic Acid, Venous: 2.1 mmol/L (ref 0.5–1.9)
Lactic Acid, Venous: 2.2 mmol/L (ref 0.5–1.9)
Lactic Acid, Venous: 1.9 mmol/L (ref 0.5–1.9)

## 2024-05-14 LAB — GLUCOSE, CAPILLARY
Glucose-Capillary: 241 mg/dL — ABNORMAL HIGH (ref 70–99)
Glucose-Capillary: 102 mg/dL — ABNORMAL HIGH (ref 70–99)
Glucose-Capillary: 122 mg/dL — ABNORMAL HIGH (ref 70–99)
Glucose-Capillary: 118 mg/dL — ABNORMAL HIGH (ref 70–99)

## 2024-05-14 LAB — TSH: TSH: 1.887 u[IU]/mL (ref 0.350–4.500)

## 2024-05-14 LAB — BRAIN NATRIURETIC PEPTIDE: B Natriuretic Peptide: 907.6 pg/mL — ABNORMAL HIGH (ref 0.0–100.0)

## 2024-05-14 LAB — PROCALCITONIN: Procalcitonin: <0.1 ng/mL

## 2024-05-14 LAB — HEMOGLOBIN A1C
Hgb A1c MFr Bld: 5.9 % — ABNORMAL HIGH (ref 4.8–5.6)
Mean Plasma Glucose: 122.63 mg/dL

## 2024-05-14 LAB — MAGNESIUM: Magnesium: 1.9 mg/dL (ref 1.7–2.4)

## 2024-05-14 MED ORDER — MIRABEGRON ER 25 MG PO TB24
25.0000 mg | ORAL_TABLET | Freq: Every day | ORAL | Status: DC
Start: 2024-05-14 — End: 2024-05-20
  Administered 2024-05-14 – 2024-05-20 (×6): 25 mg via ORAL
  Filled 2024-05-14 (×7): qty 1

## 2024-05-14 MED ORDER — METOPROLOL TARTRATE 25 MG PO TABS
25.0000 mg | ORAL_TABLET | Freq: Two times a day (BID) | ORAL | Status: DC
Start: 2024-05-14 — End: 2024-05-17
  Administered 2024-05-14 – 2024-05-16 (×6): 25 mg via ORAL
  Filled 2024-05-14 (×6): qty 1

## 2024-05-14 MED ORDER — SODIUM CHLORIDE 0.9 % IV SOLN
2.0000 g | INTRAVENOUS | Status: AC
  Administered 2024-05-14 – 2024-05-18 (×5): 2 g via INTRAVENOUS
  Filled 2024-05-14 (×5): qty 20

## 2024-05-14 MED ORDER — INSULIN ASPART 100 UNIT/ML IJ SOLN
0.0000 [IU] | Freq: Three times a day (TID) | INTRAMUSCULAR | Status: DC
  Administered 2024-05-14: 3 [IU] via SUBCUTANEOUS
  Administered 2024-05-14 – 2024-05-16 (×2): 1 [IU] via SUBCUTANEOUS
  Administered 2024-05-17 – 2024-05-18 (×2): 2 [IU] via SUBCUTANEOUS
  Administered 2024-05-19 – 2024-05-20 (×2): 1 [IU] via SUBCUTANEOUS

## 2024-05-14 MED ORDER — ROSUVASTATIN CALCIUM 20 MG PO TABS
40.0000 mg | ORAL_TABLET | Freq: Every day | ORAL | Status: DC
  Administered 2024-05-14 – 2024-05-19 (×6): 40 mg via ORAL
  Filled 2024-05-14: qty 2
  Filled 2024-05-14: qty 8
  Filled 2024-05-14 (×2): qty 2
  Filled 2024-05-14: qty 8
  Filled 2024-05-14: qty 2

## 2024-05-14 MED ORDER — FUROSEMIDE 10 MG/ML IJ SOLN
40.0000 mg | Freq: Every day | INTRAMUSCULAR | Status: DC
  Administered 2024-05-14: 40 mg via INTRAVENOUS
  Filled 2024-05-14: qty 4

## 2024-05-14 MED ORDER — SODIUM CHLORIDE 0.9 % IV SOLN
100.0000 mg | Freq: Two times a day (BID) | INTRAVENOUS | Status: DC
  Administered 2024-05-14 – 2024-05-16 (×5): 100 mg via INTRAVENOUS
  Filled 2024-05-14 (×6): qty 100

## 2024-05-14 MED ORDER — ACETAMINOPHEN 650 MG RE SUPP: 650.0000 mg | Freq: Four times a day (QID) | RECTAL | Status: DC | PRN

## 2024-05-14 MED ORDER — APIXABAN 2.5 MG PO TABS
2.5000 mg | ORAL_TABLET | Freq: Two times a day (BID) | ORAL | Status: DC
Start: 2024-05-14 — End: 2024-05-14

## 2024-05-14 MED ORDER — SODIUM CHLORIDE 0.9 % IV SOLN: 250.0000 mL | INTRAVENOUS | Status: DC | PRN

## 2024-05-14 MED ORDER — SODIUM CHLORIDE 0.9% FLUSH
3.0000 mL | Freq: Two times a day (BID) | INTRAVENOUS | Status: DC
  Administered 2024-05-15: 3 mL via INTRAVENOUS

## 2024-05-14 MED ORDER — APIXABAN 2.5 MG PO TABS
2.5000 mg | ORAL_TABLET | Freq: Two times a day (BID) | ORAL | Status: DC
  Administered 2024-05-14 (×2): 2.5 mg via ORAL
  Filled 2024-05-14 (×2): qty 1

## 2024-05-14 MED ORDER — ALBUTEROL SULFATE (2.5 MG/3ML) 0.083% IN NEBU
2.5000 mg | INHALATION_SOLUTION | RESPIRATORY_TRACT | Status: DC
  Administered 2024-05-14 (×2): 2.5 mg via RESPIRATORY_TRACT
  Filled 2024-05-14 (×3): qty 3

## 2024-05-14 MED ORDER — LEVOTHYROXINE SODIUM 50 MCG PO TABS
50.0000 ug | ORAL_TABLET | Freq: Every day | ORAL | Status: DC
  Administered 2024-05-14 – 2024-05-20 (×7): 50 ug via ORAL
  Filled 2024-05-14 (×7): qty 1

## 2024-05-14 MED ORDER — SODIUM CHLORIDE 0.9% FLUSH: 3.0000 mL | INTRAVENOUS | Status: DC | PRN

## 2024-05-14 MED ORDER — ACETAMINOPHEN 325 MG PO TABS: 650.0000 mg | ORAL_TABLET | Freq: Four times a day (QID) | ORAL | Status: DC | PRN

## 2024-05-14 MED ORDER — ALBUTEROL SULFATE (2.5 MG/3ML) 0.083% IN NEBU: 2.5000 mg | INHALATION_SOLUTION | RESPIRATORY_TRACT | Status: DC | PRN

## 2024-05-14 MED ORDER — LOSARTAN POTASSIUM 50 MG PO TABS
50.0000 mg | ORAL_TABLET | Freq: Every day | ORAL | Status: DC
Start: 2024-05-14 — End: 2024-05-14

## 2024-05-14 MED ORDER — HEPARIN (PORCINE) 25000 UT/250ML-% IV SOLN
1550.0000 [IU]/h | INTRAVENOUS | Status: DC
  Administered 2024-05-14 – 2024-05-15 (×2): 1550 [IU]/h via INTRAVENOUS
  Filled 2024-05-14 (×2): qty 250

## 2024-05-14 MED ORDER — APIXABAN 2.5 MG PO TABS: 2.5000 mg | ORAL_TABLET | Freq: Two times a day (BID) | ORAL | Status: DC

## 2024-05-14 MED ORDER — ASPIRIN 81 MG PO TBEC
81.0000 mg | DELAYED_RELEASE_TABLET | Freq: Every day | ORAL | Status: DC
Start: 2024-05-14 — End: 2024-05-20
  Administered 2024-05-14 – 2024-05-20 (×6): 81 mg via ORAL
  Filled 2024-05-14 (×6): qty 1

## 2024-05-14 NOTE — Progress Notes (Signed)
 [facility]                                                                              Kirk [name], is a 78 y.o. 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  Patient had come to the ER about 3 days ago with shortness of breath with cough and wheezing.  At that time patient was diagnosed with possible pneumonia and bronchitis was discharged home on antibiotics and prednisone .  Due to worsening shortness of breath patient presents back to the ER.  Denied any chest pain or abdominal pain.   ED Course: In the ER patient was acutely short of breath and had to be placed on BiPAP.  Chest x-ray shows features concerning for fluid overload was given Lasix  40 mg IV.  Troponin remained flat.  On exam patient was diffusely wheezing, was placed on nebulizer therapy.  COVID flu test were negative respiratory panel was  negative 2 days ago.   proBNP was 2400 troponins were 57 and 52.  WBC count was 16.1.  Assessment & Plan    Principal Problem:   Acute hypoxemic respiratory failure (HCC) Acute on chronic diastolic CHF - Initially placed on BiPAP in ED, improved after Lasix  IV 40 mg x 1 and nebulizer treatments - Multifactorial likely due to acute CHF versus acute bronchitis with PNA, history of PE, BNP 2462 - Will continue Lasix  40 mg IV daily, follow-up cardiology recommendations - Cr trended up to 1.4 - Strict I's and O's and daily weights, 2D echo - Previous echo 09/2023 had shown EF of 60 to 65%, grade 2 diastolic  dysfunction, normal right ventricular systolic function, -bioprosthetic aortic valve  Acute bronchitis/pneumonia - Chest x-ray shows no dense consolidation, favors pulmonary edema - Continue IV Lasix , continue IV ceftriaxone , doxycycline , albuterol  - Received IV Solu-Medrol  125 mg x 1, currently no acute wheezing  CAD status post CABG, HTN -Currently no chest pain, troponins flat, continue eliquis , beta-blocker, statin - Follow 2D echo  History of bioprosthetic aortic valve replacement - Follow 2D echo  History of saddle PE, on anticoagulation - Continue Eliquis   Mild acute kidney injury - Follow metabolic panel, on diuresis  Diabetes mellitus type 2 - On Ozempic  and Farxiga  - For now continue sliding scale insulin  while inpatient  Hypothyroidism - Continue Synthroid   Obesity class I Estimated body mass index is 31.31 kg/m as calculated from the following:   Height as of this encounter: 5' 11 (1.803 m).   Weight as of this encounter: 101.8 kg.  Code Status: Full code DVT Prophylaxis:  apixaban  (ELIQUIS ) tablet 2.5 mg Start: 05/14/24 0300 apixaban  (  ELIQUIS ) tablet 2.5 mg   Level of Care: Level of care: Progressive Family Communication: Updated patient Disposition Plan:      Remains inpatient appropriate:      Procedures:    Consultants:   Cardiology  Antimicrobials:   Anti-infectives (From admission, onward)    Start     Dose/Rate Route Frequency Ordered Stop   05/14/24 1800  cefTRIAXone  (ROCEPHIN ) 2 g in sodium chloride  0.9 % 100 mL IVPB        2 g 200 mL/hr over 30 Minutes Intravenous Every 24 hours 05/14/24 0536     05/14/24 0630  doxycycline  (VIBRAMYCIN ) 100 mg in sodium chloride  0.9 % 250 mL IVPB        100 mg 125 mL/hr over 120 Minutes Intravenous Every 12 hours 05/14/24 0532     05/13/24 1815  cefTRIAXone  (ROCEPHIN ) 1 g in sodium chloride  0.9 % 100 mL IVPB        1 g 200 mL/hr over 30 Minutes Intravenous  Once 05/13/24 1808 05/13/24 1926   05/13/24  1815  azithromycin  (ZITHROMAX ) 500 mg in sodium chloride  0.9 % 250 mL IVPB        500 mg 250 mL/hr over 60 Minutes Intravenous  Once 05/13/24 1808 05/13/24 2049          Medications  albuterol   2.5 mg Nebulization Q4H   apixaban   2.5 mg Oral BID   aspirin  EC  81 mg Oral Daily   insulin  aspart  0-9 Units Subcutaneous TID WC   levothyroxine   50 mcg Oral QAC breakfast   metoprolol  tartrate  25 mg Oral BID   mirabegron  ER  25 mg Oral Daily   rosuvastatin   40 mg Oral QHS      Subjective:   Kirk [name] was seen and examined today.  Off the BiPAP now, feeling better.  On 2 L O2 via [place].  No acute dizziness, chest pain, shortness of breath, abdominal pain, N/V. Objective:   Vitals:   05/14/24 0050 05/14/24 0301 05/14/24 0408 05/14/24 0829  BP: (!) 103/54 (!) 102/52  118/77  Pulse: 95 92  (!) 101  Resp: (!) 24 (!) 24  20  Temp: 97.7 F (36.5 C) 97.8 F (36.6 C)  97.6 F (36.4 C)  TempSrc: Oral Oral  Oral  SpO2: 94% 93% 95% 96%  Weight:      Height:       No intake or output data in the 24 hours ending 05/14/24 1119   Wt Readings from Last 3 Encounters:  05/13/24 101.8 kg  04/07/24 99.2 kg  02/04/24 99.2 kg     Exam General: Alert and oriented x 3, NAD Cardiovascular: S1 S2 auscultated,  RRR Respiratory: Diminished breath sounds at the bases, no wheezing Gastrointestinal: Soft, nontender, nondistended, + bowel sounds Ext: no pedal edema bilaterally Neuro: No new deficits Psych: Normal affect     Data Reviewed:  I have personally reviewed following labs    CBC Lab Results  Component Value Date   WBC 19.5 (H) 05/14/2024   RBC 4.82 05/14/2024   HGB 14.4 05/14/2024   HCT 46.3 05/14/2024   MCV 96.1 05/14/2024   MCH 29.9 05/14/2024   PLT 163 05/14/2024   MCHC 31.1 05/14/2024   RDW 16.0 (H) 05/14/2024   LYMPHSABS 0.8 05/14/2024   MONOABS 1.3 (H) 05/14/2024   EOSABS 0.1 05/14/2024   BASOSABS 0.0 05/14/2024     Last metabolic panel Lab Results   Component Value Date   NA  140 05/14/2024   K 5.2 (H) 05/14/2024   CL 103 05/14/2024   CO2 22 05/14/2024   BUN 25 (H) 05/14/2024   CREATININE 1.43 (H) 05/14/2024   GLUCOSE 153 (H) 05/14/2024   GFRNONAA 51 (L) 05/14/2024   GFRAA >60 01/08/2018   CALCIUM  8.7 (L) 05/14/2024   PROT 7.0 05/14/2024   ALBUMIN  3.7 05/14/2024   LABGLOB 2.8 08/21/2023   BILITOT 1.2 05/14/2024   ALKPHOS 61 05/14/2024   AST 35 05/14/2024   ALT 25 05/14/2024   ANIONGAP 15 05/14/2024    CBG (last 3)  Recent Labs    05/13/24 1606 05/14/24 0649  GLUCAP 230* 241*      Coagulation Profile: No results for input(s): INR, PROTIME in the last 168 hours.   Radiology Studies: I have personally reviewed the imaging studies  DG Chest 2 View Result Date: 05/13/2024 CLINICAL DATA:  sob, wheezing and crackles. EXAM: CHEST - 2 VIEW COMPARISON:  05/10/2024. FINDINGS: Low lung volume. Mild to moderate diffuse pulmonary vascular congestion. Bilateral lung fields are otherwise clear. No dense consolidation or lung collapse. There bilateral small pleural effusions, blunting the costophrenic angles. Note is made of elevated right hemidiaphragm. Stable cardio-mediastinal silhouette. There are surgical staples along the heart border and sternotomy wires, status post CABG (coronary artery bypass graft). No acute osseous abnormalities. Lower cervical spinal fixation hardware noted. The soft tissues are within normal limits. IMPRESSION: Findings favor congestive heart failure/pulmonary edema. Electronically Signed   By: Ree Molt M.D.   On: 05/13/2024 16:16       Lachrista Heslin M.D. [facility] 05/14/2024, 11:19 AM  Available via Epic secure chat 7am-7pm After 7 pm, please refer to night coverage provider listed on amion.

## 2024-05-14 NOTE — Plan of Care (Signed)

## 2024-05-14 NOTE — Progress Notes (Addendum)
 PHARMACY - ANTICOAGULATION CONSULT NOTE  Pharmacy Consult for heparin   Indication: history of PE/DVT  Allergies  Allergen Reactions   Cat Dander Shortness Of Breath and Swelling   Hydrocodone  Other (See Comments)    Seizures    Atorvastatin  Other (See Comments)    Joint pain   Dust Mite Extract Other (See Comments) and Swelling   Metformin Other (See Comments)    Diarrhea, muscle cramps   Oxycodone  Other (See Comments)    Seizure PATIENT DOES NOT WANT ANY NARCOTICS   Semaglutide (0.25 Or 0.5mg -Dos) Nausea Only    AT HIGHER DOSE    Patient Measurements: Height: 5' 11 (180.3 cm) Weight: 101.8 kg (224 lb 8 oz) IBW/kg (Calculated) : 75.3 HEPARIN  DW (KG): 96.4  Vital Signs: Temp: 97.8 F (36.6 C) (09/10 1636) Temp Source: Oral (09/10 1636) BP: 123/66 (09/10 1636) Pulse Rate: 78 (09/10 1636)  Labs: Recent Labs    05/13/24 1425 05/13/24 1435 05/14/24 0322 05/14/24 0754  HGB 15.3 17.0  --  14.4  HCT 49.2 50.0  --  46.3  PLT 178  --   --  163  CREATININE 1.28*  --  1.43*  --     Estimated Creatinine Clearance: 53.4 mL/min (A) (by C-G formula based on SCr of 1.43 mg/dL (H)).   Medical History: Past Medical History:  Diagnosis Date   Allergy    Anal cancer (HCC) 04/2012   Aortic stenosis    valve replacement   Arthritis    1995- cerv. 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   CHF (congestive heart failure) (HCC)    Coronary artery disease    aortic stenosis, CAD   Diabetes mellitus    Type 2   DVT (deep venous thrombosis) (HCC)    ED (erectile dysfunction)    Elevated cholesterol    Heart murmur    no problems   History of kidney stones    passed, lithrotrispy and surgery   History of radiation therapy 05/20/12-06/28/12   anal cancer=54gy total dose   Hypertension    Hypothyroidism    Kidney stones     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   Malignant neoplasm of other sites of rectum, rectosigmoid junction, and anus 04/12/2012   Myocardial infarction (HCC) 2012   Personal history of colonic polyps 03/20/2012   Pneumonia    Pulmonary embolism (HCC)    Rectal bleeding    Rectal mass 03/20/2012   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   05/13/2024 Morning   benzonatate  (TESSALON ) 100 MG capsule Take 1 capsule (100 mg total) by mouth every 8 (eight) hours. 21 capsule 0 05/13/2024 Morning   DULoxetine  (  CYMBALTA ) 60 MG capsule Take 1 capsule by mouth at bedtime 90 capsule 0 05/12/2024 Bedtime   FARXIGA  5 MG TABS tablet Take 1 tablet by mouth once daily 90 tablet 0 05/13/2024 Morning   levothyroxine  (SYNTHROID ) 50 MCG tablet Take 1 tablet (50 mcg total) by mouth daily before breakfast. 90 tablet 1 05/13/2024 Morning   losartan  (COZAAR ) 50 MG tablet Take 1 tablet by mouth once daily 90 tablet 0 05/13/2024 Morning   metoprolol  tartrate (LOPRESSOR ) 25 MG tablet Take 1 tablet by mouth twice daily 180 tablet 0 05/13/2024 Morning   MYRBETRIQ  25 MG TB24 tablet Take 1 tablet (25 mg total) by mouth daily. 90 tablet 3 05/13/2024 Morning   OZEMPIC , 0.25 OR 0.5 MG/DOSE, 2 MG/3ML SOPN INJECT 0.25MG   SUBCUTANEOUSLY ONCE A WEEK 3 mL 0 Past Week   predniSONE  (DELTASONE ) 20 MG tablet 3 tabs po day one, then 2 tabs daily x 4 days 11 tablet 0 05/13/2024 Morning   rosuvastatin   (CRESTOR ) 40 MG tablet TAKE 1 TABLET BY MOUTH AT BEDTIME 90 tablet 0 05/12/2024 Bedtime   tadalafil  (CIALIS ) 20 MG tablet Take 20 mg by mouth daily as needed for erectile dysfunction.   Taking As Needed   triamcinolone  cream (KENALOG ) 0.1 % APPLY CREAM EXTERNALLY TO AFFECTED AREA TWICE DAILY AS NEEDED FOR ITCHING   Taking   Vitamins-Lipotropics (LIPO-FLAVONOID PLUS) TABS Take 1 tablet by mouth daily.   05/12/2024   zinc sulfate, 50mg  elemental zinc, 220 (50 Zn) MG capsule Take 220 mg by mouth daily.   05/12/2024 Evening   HYDROmorphone  (DILAUDID ) 2 MG tablet Take 0.5-1 tablets (1-2 mg total) by mouth every 8 (eight) hours as needed for severe pain (pain score 7-10). (Patient not taking: Reported on 05/13/2024) 21 tablet 0 Not Taking   methocarbamol  (ROBAXIN ) 500 MG tablet Take 1 tablet (500 mg total) by mouth every 6 (six) hours as needed for muscle spasms. (Patient not taking: Reported on 05/13/2024) 40 tablet 0 Not Taking   NEEDLE, DISP, 22 G 22G X 1-1/2 MISC 1 each by Does not apply route once a week. Use to administer medication. 50 each 1    ondansetron  (ZOFRAN ) 4 MG tablet Take 1 tablet (4 mg total) by mouth every 6 (six) hours as needed for nausea. (Patient not taking: Reported on 05/13/2024) 20 tablet 0 Not Taking   traMADol  (ULTRAM ) 50 MG tablet Take 1-2 tablets (50-100 mg total) by mouth every 6 (six) hours as needed for moderate pain (pain score 4-6). (Patient not taking: Reported on 05/13/2024) 40 tablet 0 Not Taking   Scheduled:   aspirin  EC  81 mg Oral Daily   furosemide   40 mg Intravenous Daily   insulin  aspart  0-9 Units Subcutaneous TID WC   levothyroxine   50 mcg Oral QAC breakfast   metoprolol  tartrate  25 mg Oral BID   mirabegron  ER  25 mg Oral Daily   rosuvastatin   40 mg Oral QHS   sodium chloride  flush  3 mL Intravenous Q12H    Assessment: 78 yo male here with HF. He is on apixaban  PTA for history of PE/DVT in 2023 (and history of squamous cell carcinoma). Plans are for possible  RHC  -last dose of apixaban  was 9/10 at 8:20am   Goal of Therapy:  Heparin  level 0.3-0.7 units/ml aPTT 66-102 seconds Monitor platelets by anticoagulation protocol: Yes   Plan:  -No heparin  bolus due to recent apixaban  -Start IV heparin  1550 units/hr at 8:30pm -heparin  level and aPTT in 8 hrs  Prentice Poisson, PharmD Clinical Pharmacist **Pharmacist phone directory can now be found on [email or web address] ([email or web address]).  Listed under [facility].

## 2024-05-14 NOTE — H&P (View-Only) (Signed)
 Cardiology Consultation   Patient ID: Kirk [name] MRN: [id number]; DOB: [birth year]  Admit date: 05/13/2024 Date of Consult: 05/14/2024  PCP:  [name], Kirk [name]   [facility] Providers Cardiologist:  Kirk Parchment, [name]        Patient Profile: Kirk [name] is a 78 y.o. male with a hx of CAD s/p single-vessel bypass [LIMA to LAD] with aortic valve replacement in 2012, history of squamous cell carcinoma of the rectum now in remission, and history of recurrent DVT/PE on Eliquis  who is being seen 05/14/2024 for the evaluation of heart failure exacerbation at the request of Kirk Rai [name].  History of Present Illness: Kirk [name] follows with Kirk. [name] with his first meeting in 2012 when patient presented to the ED for acute chest pain.  He ultimately underwent single-vessel CABG [LIMA to LAD] with aortic valve replacement. In 2023 patient had an acute DVT to the right femoral, popliteal, peroneum, neo-vein, and gastrinomas veins with an acute saddle PE resulting in right heart strain.  He was put on Eliquis  and will continue on Eliquis  lifelong given a prior history of PE and DVT. He was last seen by Kirk. Parchment 08/2023 for preoperative evaluation for knee replacement.  At that time patient was doing overall well. Patient had his knee replacement 11/2023.   His most recent echocardiogram was in 09/2023 and at that time LVEF was 60 to 65% with no RWMA.  Mild concentric LVH. G2 DD.  Normal RV function. Mildly increased gradient across the aortic valve replacement with no evidence of perivalvular leakage.  Presented to the ED on 9/6 for increasing shortness of breath and productive cough. At that time he was diagnosed with bronchitis and given a prescription of prednisone , antitussive , and an albuterol  inhaler.  He then represented to the ED on 9/9 for worsening shortness of breath and productive cough. Initially was tachypneic and hypoxic with SpO2 in the 80s, he was then placed on  5 L nasal cannula though he was then transitioned to BiPAP with improvement in his respiratory status He was given IV Lasix  40 mg x 2.  No I's and O's charted.  Later that evening he became hypotensive to 77/58.  He was given 500 mL of NS bolus.  He has been started on IV antibiotics.  Pertinent lab work K 5.2 Creatinine 1.28 ->1.48 [up from 4 days ago] proBNP 2462 [up from 4 days ago] Troponin 57 -> 52 WBC 16.1 -> 19.5 with elevated neutrophils  EKG shows sinus rhythm with RAD & TWI in inferior leads and V5-V6, VR 71 [RAD and TWI are new compared to prior] CXR shows mild to moderate pulmonary congestion and bilateral small pleural effusions. [New since 4 days ago] Echocardiogram pending  Most recent vitals HR 79 and BP: 98/62 On interview patient shares for the last several weeks he has noticed orthopnea and abdominal distention. Saturday he noticed increasing shortness of breath with associated productive cough and congestion. These symptoms initially improved then worsened prompting the return to the ED. Denied chest pain, was able to work in the yard on Friday without difficulty. Though he does note ever since his knee replacement in march he has not been able to do as much 2/2 DOE.  Denied sick contacts, fever, and chills.    Past Medical History:  Diagnosis Date   Allergy    Anal cancer (HCC) 04/2012   Aortic stenosis    valve replacement   Arthritis  1995- cerv. 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   CHF (congestive heart failure) (HCC)    Coronary artery disease    aortic stenosis, CAD   Diabetes mellitus    Type 2   DVT (deep venous thrombosis) (HCC)    ED (erectile dysfunction)    Elevated cholesterol    Heart murmur    no problems   History of kidney stones    passed, lithrotrispy and surgery   History of  radiation therapy 05/20/12-06/28/12   anal cancer=54gy total dose   Hypertension    Hypothyroidism    Kidney stones    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   Malignant neoplasm of other sites of rectum, rectosigmoid junction, and anus 04/12/2012   Myocardial infarction (HCC) 2012   Personal history of colonic polyps 03/20/2012   Pneumonia    Pulmonary embolism (HCC)    Rectal bleeding    Rectal mass 03/20/2012   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   #71mm [facility] Ease pericardial tissue valve    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   LIMA to diagonal branch    Seizure disorder, grand mal (HCC)    only 1   Seizures (HCC)    after taking Oxycodone ; not prescribed seizure med   Shortness of breath    Thrombosed hemorrhoids     Past Surgical History:  Procedure Laterality Date   ANAL FISSURE REPAIR     patient unsure of date   ANTERIOR CERVICAL DECOMP/DISCECTOMY FUSION N/A 01/22/2018   Procedure: ANTERIOR CERVICAL DECOMPRESSION/DISCECTOMY FUSION CERVICAL SIX- CERVICAL SEVEN;  Surgeon: Kirk Pac, [name];  Location: [facility] OR;  Service: Neurosurgery;  Laterality: N/A;  ANTERIOR CERVICAL DECOMPRESSION/DISCECTOMY FUSION CERVICAL SIX- CERVICAL SEVEN   AORTIC VALVE REPLACEMENT  08/11/2011   Procedure: AORTIC VALVE REPLACEMENT (AVR);  Surgeon: Kirk VEAR Laine, [name];  [facility] OR;  Service: Open Heart Surgery;  Laterality: N/A;   C4-6 FUSION  1995   Kirk [name]   COLONOSCOPY     hx polyps   CORONARY ARTERY BYPASS GRAFT  08/16/2011   Procedure: CORONARY ARTERY BYPASS GRAFTING (CABG);  Surgeon: Kirk VEAR Laine, [name];  Location: [facility] OR;  Service: Open Heart Surgery;  Laterality: N/A;  CABG times one using left internal mammary artery   LITHOTRIPSY  2001 AND 2002   Kirk [name]   POLYPECTOMY     colon   TOTAL KNEE ARTHROPLASTY Right 06/18/2023   Procedure: RIGHT TOTAL KNEE ARTHROPLASTY;  Surgeon: Kirk Lerner, [name];  Location: [facility] ORS;  Service: Orthopedics;  Laterality: Right;    TOTAL KNEE ARTHROPLASTY Left 11/05/2023   Procedure: ARTHROPLASTY, KNEE, TOTAL;  Surgeon: Kirk Lerner, [name];  Location: [facility] ORS;  Service: Orthopedics;  Laterality: Left;       Scheduled Meds:  apixaban   2.5 mg Oral BID   aspirin  EC  81 mg Oral Daily   furosemide   40 mg Intravenous Daily   insulin  aspart  0-9 Units Subcutaneous TID WC   levothyroxine   50 mcg Oral QAC breakfast   metoprolol  tartrate  25 mg Oral BID   mirabegron  ER  25 mg Oral Daily   rosuvastatin   40 mg Oral QHS   Continuous Infusions:  cefTRIAXone  (ROCEPHIN )  IV     doxycycline  (VIBRAMYCIN ) IV 100 mg (05/14/24 0827)   PRN Meds: acetaminophen  **  OR** acetaminophen , albuterol   Allergies:    Allergies  Allergen Reactions   Cat Dander Shortness Of Breath and Swelling   Hydrocodone  Other (See Comments)    Seizures    Atorvastatin  Other (See Comments)    Joint pain   Dust Mite Extract Other (See Comments) and Swelling   Metformin Other (See Comments)    Diarrhea, muscle cramps   Oxycodone  Other (See Comments)    Seizure PATIENT DOES NOT WANT ANY NARCOTICS   Semaglutide (0.25 Or 0.5mg -Dos) Nausea Only    AT HIGHER DOSE    Social History:   Social History   Socioeconomic History   Marital status: Married    Spouse name: Photographer   Number of children: 2   Years of education: Not on file   Highest education level: Not on file  Occupational History   Occupation: Surveyor, minerals semi-retired  Tobacco Use   Smoking status: Never    Passive exposure: Never   Smokeless tobacco: Never  Vaping Use   Vaping status: Never Used  Substance and Sexual Activity   Alcohol  use: Yes    Comment: rarely   Drug use: No   Sexual activity: Not Currently  Other Topics Concern   Not on file  Social History Narrative   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, Sharman   Self-employed painter   Social Drivers of Health   Financial Resource Strain: Low Risk  (02/06/2023)   Overall Financial Resource Strain (CARDIA)    Difficulty of Paying Living  Expenses: Not hard at all  Food Insecurity: No Food Insecurity (05/14/2024)   Hunger Vital Sign    Worried About Running Out of Food in the Last Year: Never true    Ran Out of Food in the Last Year: Never true  Transportation Needs: No Transportation Needs (05/14/2024)   PRAPARE - Administrator, Civil Service (Medical): No    Lack of Transportation (Non-Medical): No  Physical Activity: Sufficiently Active (02/06/2023)   Exercise Vital Sign    Days of Exercise per Week: 7 days    Minutes of Exercise per Session: 30 min  Stress: No Stress Concern Present (02/06/2023)   [employer] of Occupational Health - Occupational Stress Questionnaire    Feeling of Stress : Not at all  Social Connections: Socially Integrated (05/14/2024)   Social Connection and Isolation Panel    Frequency of Communication with Friends and Family: More than three times a week    Frequency of Social Gatherings with Friends and Family: More than three times a week    Attends Religious Services: More than 4 times per year    Active Member of [employer] or Organizations: Yes    Attends Banker Meetings: Never    Marital Status: Married  Catering manager Violence: Not At Risk (05/14/2024)   Humiliation, Afraid, Rape, and Kick questionnaire    Fear of Current or Ex-Partner: No    Emotionally Abused: No    Physically Abused: No    Sexually Abused: No    Family History:   Family History  Problem Relation Age of Onset   Breast cancer Mother    Heart attack Father    Esophageal cancer Maternal Grandfather    Anesthesia problems Neg Hx    Hypotension Neg Hx    Malignant hyperthermia Neg Hx    Pseudochol deficiency Neg Hx    Colon cancer Neg Hx    Colon polyps Neg Hx    Stomach cancer Neg Hx    Rectal cancer Neg Hx  ROS:  Please see the history of present illness.  All other ROS reviewed and negative.     Physical Exam/Data: Vitals:   05/14/24 0301 05/14/24 0408 05/14/24 0829 05/14/24  1157  BP: (!) 102/52  118/77 98/62  Pulse: 92  (!) 101 79  Resp: (!) 24  20 18   Temp: 97.8 F (36.6 C)  97.6 F (36.4 C)   TempSrc: Oral  Oral   SpO2: 93% 95% 96% 92%  Weight:      Height:       No intake or output data in the 24 hours ending 05/14/24 1220    05/13/2024   10:29 PM 04/07/2024    8:24 AM 02/04/2024    3:16 PM  Last 3 Weights  Weight (lbs) 224 lb 8 oz 218 lb 12.8 oz 218 lb 9.6 oz  Weight (kg) 101.833 kg 99.247 kg 99.156 kg     Body mass index is 31.31 kg/m.  General:  Well nourished, well developed, in no acute distress HEENT: normal Neck: unable to assess JVD during echo Cardiac:  normal S1, S2; RRR; systolic murmur 3/6 Lungs:wheezing and diminished breath sounds at bases Abd: non-tender, distended  Ext: no edema, cold lower extremities Musculoskeletal:  No deformities, BUE and BLE strength normal and equal Skin: warm and dry  Neuro:  CNs 2-12 intact, no focal abnormalities noted Psych:  Normal affect   EKG:  The EKG was personally reviewed and demonstrates:  see hpi Telemetry:  Telemetry was personally reviewed and demonstrates:  sinus rhythm HR avg 70-80  Relevant CV Studies: Echocardiogram 09/2023 IMPRESSIONS     1. Left ventricular ejection fraction, by estimation, is 60 to 65%. The  left ventricle has normal function. The left ventricle has no regional  wall motion abnormalities. There is mild concentric left ventricular  hypertrophy. Left ventricular diastolic  parameters are consistent with Grade II diastolic dysfunction  (pseudonormalization).   2. Right ventricular systolic function is normal. The right ventricular  size is normal. There is normal pulmonary artery systolic pressure. The  estimated right ventricular systolic pressure is 20.8 mmHg.   3. Left atrial size was mildly dilated.   4. The mitral valve is degenerative. Trivial mitral valve regurgitation.  No evidence of mitral stenosis. Moderate mitral annular calcification.   5.  Bioprosthetic aortic valve, leaflets not well-visualized. Mean  gradient 21 mmHg, dimensionless index 0.3. The gradient across the valve  is elevated, mildly higher than prior (16 mmHg). No peri-valvular leakage.   6. The inferior vena cava is normal in size with greater than 50%  respiratory variability, suggesting right atrial pressure of 3 mmHg.   Laboratory Data: Chemistry Recent Labs  Lab 05/10/24 0916 05/13/24 1425 05/13/24 1435 05/13/24 1708 05/14/24 0322  NA 140 138 138  --  140  K 4.5 4.9 4.5  --  5.2*  CL 105 101  --   --  103  CO2 23 25  --   --  22  GLUCOSE 126* 163*  --   --  153*  BUN 11 18  --   --  25*  CREATININE 1.11 1.28*  --   --  1.43*  CALCIUM  9.1 9.2  --   --  8.7*  MG  --   --   --  1.8 1.9  GFRNONAA >60 58*  --   --  51*  ANIONGAP 12 12  --   --  15    Recent Labs  Lab 05/14/24 0322  PROT  7.0  ALBUMIN  3.7  AST 35  ALT 25  ALKPHOS 61  BILITOT 1.2   Hematology Recent Labs  Lab 05/10/24 0916 05/13/24 1425 05/13/24 1435 05/14/24 0754  WBC 6.5 16.1*  --  19.5*  RBC 4.98 5.17  --  4.82  HGB 15.0 15.3 17.0 14.4  HCT 47.0 49.2 50.0 46.3  MCV 94.4 95.2  --  96.1  MCH 30.1 29.6  --  29.9  MCHC 31.9 31.1  --  31.1  RDW 15.4 15.9*  --  16.0*  PLT 145* 178  --  163   Thyroid   Recent Labs  Lab 05/14/24 0322  TSH 1.887    BNP Recent Labs  Lab 05/10/24 0916 05/13/24 1425  PROBNP 1,252.0* 2,462.0*     Radiology/Studies:  DG Chest 2 View Result Date: 05/13/2024 CLINICAL DATA:  sob, wheezing and crackles. EXAM: CHEST - 2 VIEW COMPARISON:  05/10/2024. FINDINGS: Low lung volume. Mild to moderate diffuse pulmonary vascular congestion. Bilateral lung fields are otherwise clear. No dense consolidation or lung collapse. There bilateral small pleural effusions, blunting the costophrenic angles. Note is made of elevated right hemidiaphragm. Stable cardio-mediastinal silhouette. There are surgical staples along the heart border and sternotomy wires,  status post CABG (coronary artery bypass graft). No acute osseous abnormalities. Lower cervical spinal fixation hardware noted. The soft tissues are within normal limits. IMPRESSION: Findings favor congestive heart failure/pulmonary edema. Electronically Signed   By: Ree Molt M.D.   On: 05/13/2024 16:16     Assessment and Plan: Acute on chronic diastolic heart failure Acute hypoxic respiratory failure Aortic stenosis s/p AVR in 2012 Hypotension AKI Patient reports he has been experiencing DOE for several months and then that several weeks ago started noticing orthopnea and abdominal distention. Now here with increased SOB and possible pneumonia. He has received IV lasix , Cr up 1.28 ->1.48  I/Os were not ordered, now ordered. Patient reports he has filled about 2.5 containers.   Echo pending, was being performed during interview  Will follow-up on echo results. I suspect a component of his hypotension is potentially from IV diuresis and his AKI most likely from hypotension.   PTA Farxiga  5 & Cozaar  50mg - continue to hold given patient's Cr. And hypotension  Would hold off on IV diuresis with hypotension, patient may benefit from co-ox Continue metoprolol  tartrate 25 mg twice daily Will speak with Kirk. Okey about potentially more diagnostic testing to assess etiology of volume overload.   CAD status post single-vessel CABG in 2012 Elevated Troponin Patient has remained stable since 2012 with no reoccurrence of chest pain.  His ECG today does show new TWI.  Troponin mildly elevated and more indicative of demand ischemia trend   Will folllow-up on echo results and assess for RWMA. Do not suspect ACS Continue ASA 81   History of recurrent DVT/PE Patient has not been tachycardic though he is hypoxic. He has not missed any eliquis  doses.Will assess echo for RV strain. Continue Eliquis  2.5 mg twice daily  Hyperlipidemia 08/2023  LDL 66   HDL 38 Crestor  40 mg  Per  primary T2DM History of squamous cell cancer of the rectum Bronchitis/pneumonia AKI Hypothyroidism Obesity  Risk Assessment/Risk Scores:       For questions or updates, please contact [facility] Please consult [email or web address] for contact info under    Signed, Leontine LOISE Salen, PA-C  05/14/2024 12:20 PM  Pt seen and examined   I agree with findings as noted by L Salen above  Pt is a 78 yo with hx  CAD and AV dz (s/p CABG and AVR in 2012), recurrent DVT/PE (on Eliquis . Followed by Kirk [name]    Came to ED on 05/10/24 for SOB and cough   Has had orthopnea   Rx for bronchitis with prednisone  and cough suppressant and inhaler Came back to ER on 9/9 with worsening SOB    Sats in 80s  Place on 5 L [place]   and BiPAP    Given lasix  x 2 with urine output   BP low so given some IV fluid back  Curently comfortable in bed  He has diuresed over 2 L  Neck:   JVP is normal Lungs CTA Cardiac III/VI systolic murmur LSB    Abd is supple   NOntenter  Ext No LE edema   Feet cool (chronically he says)  Triv flat elevation of troponin     Lactic acid 2.1 EKG with ST depression flat in Inferior and laterall leads   More pronounced than previous   CXR favors pulmonary edema     Echo today shows normal LV and RV function    Difficult windows   The mitral annulus is calcified   IN the initial few images it is suspicious that there is prolapse, posiblly flail posterior leaflet  (images 3 and 4)    MR is eccentric and although does not appear severe may be underestimate given that it is eccentric    Filling pressures are high, restrictive pattern  THis is new    New   LA is larger (new from Jan 2025) AV gradients relatively unchanged   Mean of 14  MR could explain the abrupt severe presentation.    He is breathing better now but has diuresed signficantly      I would recomm TEE to evaluate MV further     IF this is negative then would recomm R/L heart cath to define pressures and anatomy,   Ischemia may explain the flash edema that he appears to have had .    Risks and benefits both procedure described  Pt understands and agrees to proceed.  Will work with pharmacy to switch to heparin  (hx of DVT/PE)  Repeat PA/Lat CXR Get another lactic acid and BNP  Vina Gull [name]

## 2024-05-14 NOTE — Discharge Instructions (Signed)
Information on my medicine - ELIQUIS (apixaban)  This medication education was reviewed with me or my healthcare representative as part of my discharge preparation.    Why was Eliquis prescribed for you? Eliquis was prescribed to treat blood clots that may have been found in the veins of your legs (deep vein thrombosis) or in your lungs (pulmonary embolism) and to reduce the risk of them occurring again.  What do You need to know about Eliquis ? Continue Eliquis 2.5 mg tablet taken TWICE daily.  Eliquis may be taken with or without food.   Try to take the dose about the same time in the morning and in the evening. If you have difficulty swallowing the tablet whole please discuss with your pharmacist how to take the medication safely.  Take Eliquis exactly as prescribed and DO NOT stop taking Eliquis without talking to the doctor who prescribed the medication.  Stopping may increase your risk of developing a new blood clot.  Refill your prescription before you run out.  After discharge, you should have regular check-up appointments with your healthcare provider that is prescribing your Eliquis.    What do you do if you miss a dose? If a dose of ELIQUIS is not taken at the scheduled time, take it as soon as possible on the same day and twice-daily administration should be resumed. The dose should not be doubled to make up for a missed dose.  Important Safety Information A possible side effect of Eliquis is bleeding. You should call your healthcare provider right away if you experience any of the following: Bleeding from an injury or your nose that does not stop. Unusual colored urine (red or dark brown) or unusual colored stools (red or black). Unusual bruising for unknown reasons. A serious fall or if you hit your head (even if there is no bleeding).  Some medicines may interact with Eliquis and might increase your risk of bleeding or clotting while on Eliquis. To help avoid  this, consult your healthcare provider or pharmacist prior to using any new prescription or non-prescription medications, including herbals, vitamins, non-steroidal anti-inflammatory drugs (NSAIDs) and supplements.  This website has more information on Eliquis (apixaban): http://www.eliquis.com/eliquis/home

## 2024-05-14 NOTE — Consult Note (Addendum)
 Cardiology Consultation   Patient ID: NOBLE CICALESE MRN: [id number]; DOB: [birth year]  Admit date: 05/13/2024 Date of Consult: 05/14/2024  PCP:  [name], Raymond J, MD   [facility] Providers Cardiologist:  Oneil Parchment, MD        Patient Profile: Kirk [name] is a 78 y.o. male with a hx of CAD s/p single-vessel bypass [LIMA to LAD] with aortic valve replacement in 2012, history of squamous cell carcinoma of the rectum now in remission, and history of recurrent DVT/PE on Eliquis  who is being seen 05/14/2024 for the evaluation of heart failure exacerbation at the request of Ripudeep Rai MD.  History of Present Illness: Kirk [name] follows with Dr. [name] with his first meeting in 2012 when patient presented to the ED for acute chest pain.  He ultimately underwent single-vessel CABG [LIMA to LAD] with aortic valve replacement. In 2023 patient had an acute DVT to the right femoral, popliteal, peroneum, neo-vein, and gastrinomas veins with an acute saddle PE resulting in right heart strain.  He was put on Eliquis  and will continue on Eliquis  lifelong given a prior history of PE and DVT. He was last seen by Dr. Parchment 08/2023 for preoperative evaluation for knee replacement.  At that time patient was doing overall well. Patient had his knee replacement 11/2023.   His most recent echocardiogram was in 09/2023 and at that time LVEF was 60 to 65% with no RWMA.  Mild concentric LVH. G2 DD.  Normal RV function. Mildly increased gradient across the aortic valve replacement with no evidence of perivalvular leakage.  Presented to the ED on 9/6 for increasing shortness of breath and productive cough. At that time he was diagnosed with bronchitis and given a prescription of prednisone , antitussive , and an albuterol  inhaler.  He then represented to the ED on 9/9 for worsening shortness of breath and productive cough. Initially was tachypneic and hypoxic with SpO2 in the 80s, he was then placed on  5 L nasal cannula though he was then transitioned to BiPAP with improvement in his respiratory status He was given IV Lasix  40 mg x 2.  No I's and O's charted.  Later that evening he became hypotensive to 77/58.  He was given 500 mL of NS bolus.  He has been started on IV antibiotics.  Pertinent lab work K 5.2 Creatinine 1.28 ->1.48 [up from 4 days ago] proBNP 2462 [up from 4 days ago] Troponin 57 -> 52 WBC 16.1 -> 19.5 with elevated neutrophils  EKG shows sinus rhythm with RAD & TWI in inferior leads and V5-V6, VR 71 [RAD and TWI are new compared to prior] CXR shows mild to moderate pulmonary congestion and bilateral small pleural effusions. [New since 4 days ago] Echocardiogram pending  Most recent vitals HR 79 and BP: 98/62 On interview patient shares for the last several weeks he has noticed orthopnea and abdominal distention. Saturday he noticed increasing shortness of breath with associated productive cough and congestion. These symptoms initially improved then worsened prompting the return to the ED. Denied chest pain, was able to work in the yard on Friday without difficulty. Though he does note ever since his knee replacement in march he has not been able to do as much 2/2 DOE.  Denied sick contacts, fever, and chills.    Past Medical History:  Diagnosis Date   Allergy    Anal cancer (HCC) 04/2012   Aortic stenosis    valve replacement   Arthritis  1995- cerv. 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   CHF (congestive heart failure) (HCC)    Coronary artery disease    aortic stenosis, CAD   Diabetes mellitus    Type 2   DVT (deep venous thrombosis) (HCC)    ED (erectile dysfunction)    Elevated cholesterol    Heart murmur    no problems   History of kidney stones    passed, lithrotrispy and surgery   History of  radiation therapy 05/20/12-06/28/12   anal cancer=54gy total dose   Hypertension    Hypothyroidism    Kidney stones    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   Malignant neoplasm of other sites of rectum, rectosigmoid junction, and anus 04/12/2012   Myocardial infarction (HCC) 2012   Personal history of colonic polyps 03/20/2012   Pneumonia    Pulmonary embolism (HCC)    Rectal bleeding    Rectal mass 03/20/2012   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   #71mm [facility] Ease pericardial tissue valve    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   LIMA to diagonal branch    Seizure disorder, grand mal (HCC)    only 1   Seizures (HCC)    after taking Oxycodone ; not prescribed seizure med   Shortness of breath    Thrombosed hemorrhoids     Past Surgical History:  Procedure Laterality Date   ANAL FISSURE REPAIR     patient unsure of date   ANTERIOR CERVICAL DECOMP/DISCECTOMY FUSION N/A 01/22/2018   Procedure: ANTERIOR CERVICAL DECOMPRESSION/DISCECTOMY FUSION CERVICAL SIX- CERVICAL SEVEN;  Surgeon: Unice Pac, MD;  Location: [facility] OR;  Service: Neurosurgery;  Laterality: N/A;  ANTERIOR CERVICAL DECOMPRESSION/DISCECTOMY FUSION CERVICAL SIX- CERVICAL SEVEN   AORTIC VALVE REPLACEMENT  08/11/2011   Procedure: AORTIC VALVE REPLACEMENT (AVR);  Surgeon: Sudie VEAR Laine, MD;  Location[facility] OR;  Service: Open Heart Surgery;  Laterality: N/A;   C4-6 FUSION  1995   DR STERN   COLONOSCOPY     hx polyps   CORONARY ARTERY BYPASS GRAFT  08/16/2011   Procedure: CORONARY ARTERY BYPASS GRAFTING (CABG);  Surgeon: Sudie VEAR Laine, MD;  Location: [facility] OR;  Service: Open Heart Surgery;  Laterality: N/A;  CABG times one using left internal mammary artery   LITHOTRIPSY  2001 AND 2002   DR PETERSON   POLYPECTOMY     colon   TOTAL KNEE ARTHROPLASTY Right 06/18/2023   Procedure: RIGHT TOTAL KNEE ARTHROPLASTY;  Surgeon: Melodi Lerner, MD;  Location: [facility] ORS;  Service: Orthopedics;  Laterality: Right;    TOTAL KNEE ARTHROPLASTY Left 11/05/2023   Procedure: ARTHROPLASTY, KNEE, TOTAL;  Surgeon: Melodi Lerner, MD;  Location: [facility] ORS;  Service: Orthopedics;  Laterality: Left;       Scheduled Meds:  apixaban   2.5 mg Oral BID   aspirin  EC  81 mg Oral Daily   furosemide   40 mg Intravenous Daily   insulin  aspart  0-9 Units Subcutaneous TID WC   levothyroxine   50 mcg Oral QAC breakfast   metoprolol  tartrate  25 mg Oral BID   mirabegron  ER  25 mg Oral Daily   rosuvastatin   40 mg Oral QHS   Continuous Infusions:  cefTRIAXone  (ROCEPHIN )  IV     doxycycline  (VIBRAMYCIN ) IV 100 mg (05/14/24 0827)   PRN Meds: acetaminophen  **  OR** acetaminophen , albuterol   Allergies:    Allergies  Allergen Reactions   Cat Dander Shortness Of Breath and Swelling   Hydrocodone  Other (See Comments)    Seizures    Atorvastatin  Other (See Comments)    Joint pain   Dust Mite Extract Other (See Comments) and Swelling   Metformin Other (See Comments)    Diarrhea, muscle cramps   Oxycodone  Other (See Comments)    Seizure PATIENT DOES NOT WANT ANY NARCOTICS   Semaglutide (0.25 Or 0.5mg -Dos) Nausea Only    AT HIGHER DOSE    Social History:   Social History   Socioeconomic History   Marital status: Married    Spouse name: Photographer   Number of children: 2   Years of education: Not on file   Highest education level: Not on file  Occupational History   Occupation: Surveyor, minerals semi-retired  Tobacco Use   Smoking status: Never    Passive exposure: Never   Smokeless tobacco: Never  Vaping Use   Vaping status: Never Used  Substance and Sexual Activity   Alcohol  use: Yes    Comment: rarely   Drug use: No   Sexual activity: Not Currently  Other Topics Concern   Not on file  Social History Narrative   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, Kirk [name]   Self-employed painter   Social Drivers of Health   Financial Resource Strain: Low Risk  (02/06/2023)   Overall Financial Resource Strain (CARDIA)    Difficulty of Paying Living  Expenses: Not hard at all  Food Insecurity: No Food Insecurity (05/14/2024)   Hunger Vital Sign    Worried About Running Out of Food in the Last Year: Never true    Ran Out of Food in the Last Year: Never true  Transportation Needs: No Transportation Needs (05/14/2024)   PRAPARE - Administrator, Civil Service (Medical): No    Lack of Transportation (Non-Medical): No  Physical Activity: Sufficiently Active (02/06/2023)   Exercise Vital Sign    Days of Exercise per Week: 7 days    Minutes of Exercise per Session: 30 min  Stress: No Stress Concern Present (02/06/2023)   [employer] of Occupational Health - Occupational Stress Questionnaire    Feeling of Stress : Not at all  Social Connections: Socially Integrated (05/14/2024)   Social Connection and Isolation Panel    Frequency of Communication with Friends and Family: More than three times a week    Frequency of Social Gatherings with Friends and Family: More than three times a week    Attends Religious Services: More than 4 times per year    Active Member of [employer] or Organizations: Yes    Attends Banker Meetings: Never    Marital Status: Married  Catering manager Violence: Not At Risk (05/14/2024)   Humiliation, Afraid, Rape, and Kick questionnaire    Fear of Current or Ex-Partner: No    Emotionally Abused: No    Physically Abused: No    Sexually Abused: No    Family History:   Family History  Problem Relation Age of Onset   Breast cancer Mother    Heart attack Father    Esophageal cancer Maternal Grandfather    Anesthesia problems Neg Hx    Hypotension Neg Hx    Malignant hyperthermia Neg Hx    Pseudochol deficiency Neg Hx    Colon cancer Neg Hx    Colon polyps Neg Hx    Stomach cancer Neg Hx    Rectal cancer Neg Hx  ROS:  Please see the history of present illness.  All other ROS reviewed and negative.     Physical Exam/Data: Vitals:   05/14/24 0301 05/14/24 0408 05/14/24 0829 05/14/24  1157  BP: (!) 102/52  118/77 98/62  Pulse: 92  (!) 101 79  Resp: (!) 24  20 18   Temp: 97.8 F (36.6 C)  97.6 F (36.4 C)   TempSrc: Oral  Oral   SpO2: 93% 95% 96% 92%  Weight:      Height:       No intake or output data in the 24 hours ending 05/14/24 1220    05/13/2024   10:29 PM 04/07/2024    8:24 AM 02/04/2024    3:16 PM  Last 3 Weights  Weight (lbs) 224 lb 8 oz 218 lb 12.8 oz 218 lb 9.6 oz  Weight (kg) 101.833 kg 99.247 kg 99.156 kg     Body mass index is 31.31 kg/m.  General:  Well nourished, well developed, in no acute distress HEENT: normal Neck: unable to assess JVD during echo Cardiac:  normal S1, S2; RRR; systolic murmur 3/6 Lungs:wheezing and diminished breath sounds at bases Abd: non-tender, distended  Ext: no edema, cold lower extremities Musculoskeletal:  No deformities, BUE and BLE strength normal and equal Skin: warm and dry  Neuro:  CNs 2-12 intact, no focal abnormalities noted Psych:  Normal affect   EKG:  The EKG was personally reviewed and demonstrates:  see hpi Telemetry:  Telemetry was personally reviewed and demonstrates:  sinus rhythm HR avg 70-80  Relevant CV Studies: Echocardiogram 09/2023 IMPRESSIONS     1. Left ventricular ejection fraction, by estimation, is 60 to 65%. The  left ventricle has normal function. The left ventricle has no regional  wall motion abnormalities. There is mild concentric left ventricular  hypertrophy. Left ventricular diastolic  parameters are consistent with Grade II diastolic dysfunction  (pseudonormalization).   2. Right ventricular systolic function is normal. The right ventricular  size is normal. There is normal pulmonary artery systolic pressure. The  estimated right ventricular systolic pressure is 20.8 mmHg.   3. Left atrial size was mildly dilated.   4. The mitral valve is degenerative. Trivial mitral valve regurgitation.  No evidence of mitral stenosis. Moderate mitral annular calcification.   5.  Bioprosthetic aortic valve, leaflets not well-visualized. Mean  gradient 21 mmHg, dimensionless index 0.3. The gradient across the valve  is elevated, mildly higher than prior (16 mmHg). No peri-valvular leakage.   6. The inferior vena cava is normal in size with greater than 50%  respiratory variability, suggesting right atrial pressure of 3 mmHg.   Laboratory Data: Chemistry Recent Labs  Lab 05/10/24 0916 05/13/24 1425 05/13/24 1435 05/13/24 1708 05/14/24 0322  NA 140 138 138  --  140  K 4.5 4.9 4.5  --  5.2*  CL 105 101  --   --  103  CO2 23 25  --   --  22  GLUCOSE 126* 163*  --   --  153*  BUN 11 18  --   --  25*  CREATININE 1.11 1.28*  --   --  1.43*  CALCIUM  9.1 9.2  --   --  8.7*  MG  --   --   --  1.8 1.9  GFRNONAA >60 58*  --   --  51*  ANIONGAP 12 12  --   --  15    Recent Labs  Lab 05/14/24 0322  PROT  7.0  ALBUMIN  3.7  AST 35  ALT 25  ALKPHOS 61  BILITOT 1.2   Hematology Recent Labs  Lab 05/10/24 0916 05/13/24 1425 05/13/24 1435 05/14/24 0754  WBC 6.5 16.1*  --  19.5*  RBC 4.98 5.17  --  4.82  HGB 15.0 15.3 17.0 14.4  HCT 47.0 49.2 50.0 46.3  MCV 94.4 95.2  --  96.1  MCH 30.1 29.6  --  29.9  MCHC 31.9 31.1  --  31.1  RDW 15.4 15.9*  --  16.0*  PLT 145* 178  --  163   Thyroid   Recent Labs  Lab 05/14/24 0322  TSH 1.887    BNP Recent Labs  Lab 05/10/24 0916 05/13/24 1425  PROBNP 1,252.0* 2,462.0*     Radiology/Studies:  DG Chest 2 View Result Date: 05/13/2024 CLINICAL DATA:  sob, wheezing and crackles. EXAM: CHEST - 2 VIEW COMPARISON:  05/10/2024. FINDINGS: Low lung volume. Mild to moderate diffuse pulmonary vascular congestion. Bilateral lung fields are otherwise clear. No dense consolidation or lung collapse. There bilateral small pleural effusions, blunting the costophrenic angles. Note is made of elevated right hemidiaphragm. Stable cardio-mediastinal silhouette. There are surgical staples along the heart border and sternotomy wires,  status post CABG (coronary artery bypass graft). No acute osseous abnormalities. Lower cervical spinal fixation hardware noted. The soft tissues are within normal limits. IMPRESSION: Findings favor congestive heart failure/pulmonary edema. Electronically Signed   By: Ree Molt M.D.   On: 05/13/2024 16:16     Assessment and Plan: Acute on chronic diastolic heart failure Acute hypoxic respiratory failure Aortic stenosis s/p AVR in 2012 Hypotension AKI Patient reports he has been experiencing DOE for several months and then that several weeks ago started noticing orthopnea and abdominal distention. Now here with increased SOB and possible pneumonia. He has received IV lasix , Cr up 1.28 ->1.48  I/Os were not ordered, now ordered. Patient reports he has filled about 2.5 containers.   Echo pending, was being performed during interview  Will follow-up on echo results. I suspect a component of his hypotension is potentially from IV diuresis and his AKI most likely from hypotension.   PTA Farxiga  5 & Cozaar  50mg - continue to hold given patient's Cr. And hypotension  Would hold off on IV diuresis with hypotension, patient may benefit from co-ox Continue metoprolol  tartrate 25 mg twice daily Will speak with Dr. Okey about potentially more diagnostic testing to assess etiology of volume overload.   CAD status post single-vessel CABG in 2012 Elevated Troponin Patient has remained stable since 2012 with no reoccurrence of chest pain.  His ECG today does show new TWI.  Troponin mildly elevated and more indicative of demand ischemia trend   Will folllow-up on echo results and assess for RWMA. Do not suspect ACS Continue ASA 81   History of recurrent DVT/PE Patient has not been tachycardic though he is hypoxic. He has not missed any eliquis  doses.Will assess echo for RV strain. Continue Eliquis  2.5 mg twice daily  Hyperlipidemia 08/2023  LDL 66   HDL 38 Crestor  40 mg  Per  primary T2DM History of squamous cell cancer of the rectum Bronchitis/pneumonia AKI Hypothyroidism Obesity  Risk Assessment/Risk Scores:       For questions or updates, please contact [facility] Please consult [email or web address] for contact info under    Signed, Leontine LOISE Salen, PA-C  05/14/2024 12:20 PM  Pt seen and examined   I agree with findings as noted by L Salen above  Pt is a 78 yo with hx  CAD and AV dz (s/p CABG and AVR in 2012), recurrent DVT/PE (on Eliquis . Followed by Kirk [name]    Came to ED on 05/10/24 for SOB and cough   Has had orthopnea   Rx for bronchitis with prednisone  and cough suppressant and inhaler Came back to ER on 9/9 with worsening SOB    Sats in 80s  Place on 5 L [place]   and BiPAP    Given lasix  x 2 with urine output   BP low so given some IV fluid back  Curently comfortable in bed  He has diuresed over 2 L  Neck:   JVP is normal Lungs CTA Cardiac III/VI systolic murmur LSB    Abd is supple   NOntenter  Ext No LE edema   Feet cool (chronically he says)  Triv flat elevation of troponin     Lactic acid 2.1 EKG with ST depression flat in Inferior and laterall leads   More pronounced than previous   CXR favors pulmonary edema     Echo today shows normal LV and RV function    Difficult windows   The mitral annulus is calcified   IN the initial few images it is suspicious that there is prolapse, posiblly flail posterior leaflet  (images 3 and 4)    MR is eccentric and although does not appear severe may be underestimate given that it is eccentric    Filling pressures are high, restrictive pattern  THis is new    New   LA is larger (new from Jan 2025) AV gradients relatively unchanged   Mean of 14  MR could explain the abrupt severe presentation.    He is breathing better now but has diuresed signficantly      I would recomm TEE to evaluate MV further     IF this is negative then would recomm R/L heart cath to define pressures and anatomy,   Ischemia may explain the flash edema that he appears to have had .    Risks and benefits both procedure described  Pt understands and agrees to proceed.  Will work with pharmacy to switch to heparin  (hx of DVT/PE)  Repeat PA/Lat CXR Get another lactic acid and BNP  Vina Gull MD

## 2024-05-14 NOTE — Progress Notes (Signed)
  Echocardiogram 2D Echocardiogram has been performed.  Kirk [name] 05/14/2024, 2:02 PM

## 2024-05-15 ENCOUNTER — Encounter (HOSPITAL_COMMUNITY)
Admission: EM | Disposition: A | Discharge status: Home / Self Care | Payer: Self-pay | Source: Home / Self Care | Attending: Internal Medicine

## 2024-05-15 ENCOUNTER — Inpatient Hospital Stay (HOSPITAL_COMMUNITY): Admitting: Anesthesiology

## 2024-05-15 ENCOUNTER — Inpatient Hospital Stay (HOSPITAL_COMMUNITY)

## 2024-05-15 DIAGNOSIS — N189 Chronic kidney disease, unspecified: Secondary | ICD-10-CM

## 2024-05-15 DIAGNOSIS — J810 Acute pulmonary edema: Secondary | ICD-10-CM

## 2024-05-15 DIAGNOSIS — I340 Nonrheumatic mitral (valve) insufficiency: Secondary | ICD-10-CM

## 2024-05-15 DIAGNOSIS — I130 Hypertensive heart and chronic kidney disease with heart failure and stage 1 through stage 4 chronic kidney disease, or unspecified chronic kidney disease: Secondary | ICD-10-CM

## 2024-05-15 DIAGNOSIS — I509 Heart failure, unspecified: Secondary | ICD-10-CM | POA: Diagnosis not present

## 2024-05-15 DIAGNOSIS — I350 Nonrheumatic aortic (valve) stenosis: Secondary | ICD-10-CM

## 2024-05-15 DIAGNOSIS — J9601 Acute respiratory failure with hypoxia: Secondary | ICD-10-CM | POA: Diagnosis not present

## 2024-05-15 HISTORY — PX: TRANSESOPHAGEAL ECHOCARDIOGRAM (CATH LAB): EP1270

## 2024-05-15 HISTORY — PX: RIGHT HEART CATH AND CORONARY/GRAFT ANGIOGRAPHY: CATH118265

## 2024-05-15 LAB — POCT I-STAT EG7
Acid-Base Excess: 4 mmol/L — ABNORMAL HIGH (ref 0.0–2.0)
Acid-Base Excess: 4 mmol/L — ABNORMAL HIGH (ref 0.0–2.0)
Bicarbonate: 29.7 mmol/L — ABNORMAL HIGH (ref 20.0–28.0)
Bicarbonate: 29.8 mmol/L — ABNORMAL HIGH (ref 20.0–28.0)
Calcium, Ion: 1.17 mmol/L (ref 1.15–1.40)
Calcium, Ion: 1.17 mmol/L (ref 1.15–1.40)
HCT: 48 % (ref 39.0–52.0)
HCT: 48 % (ref 39.0–52.0)
Hemoglobin: 16.3 g/dL (ref 13.0–17.0)
Hemoglobin: 16.3 g/dL (ref 13.0–17.0)
O2 Saturation: 53 %
O2 Saturation: 54 %
Potassium: 4.5 mmol/L (ref 3.5–5.1)
Potassium: 4.5 mmol/L (ref 3.5–5.1)
Sodium: 137 mmol/L (ref 135–145)
Sodium: 137 mmol/L (ref 135–145)
TCO2: 31 mmol/L (ref 22–32)
TCO2: 31 mmol/L (ref 22–32)
pCO2, Ven: 47.8 mmHg (ref 44–60)
pCO2, Ven: 48.8 mmHg (ref 44–60)
pH, Ven: 7.392 (ref 7.25–7.43)
pH, Ven: 7.403 (ref 7.25–7.43)
pO2, Ven: 29 mmHg — CL (ref 32–45)
pO2, Ven: 29 mmHg — CL (ref 32–45)

## 2024-05-15 LAB — GLUCOSE, CAPILLARY
Glucose-Capillary: 107 mg/dL — ABNORMAL HIGH (ref 70–99)
Glucose-Capillary: 107 mg/dL — ABNORMAL HIGH (ref 70–99)
Glucose-Capillary: 93 mg/dL (ref 70–99)
Glucose-Capillary: 109 mg/dL — ABNORMAL HIGH (ref 70–99)

## 2024-05-15 LAB — ECHO TEE
AR max vel: 0.68 cm2
AV Area VTI: 0.69 cm2
AV Area mean vel: 0.75 cm2
AV Mean grad: 53.5 mmHg
AV Peak grad: 91.8 mmHg
Ao pk vel: 4.79 m/s
Area-P 1/2: 2.55 cm2
MV VTI: 2.16 cm2

## 2024-05-15 LAB — CBC
HCT: 50.1 % (ref 39.0–52.0)
Hemoglobin: 16 g/dL (ref 13.0–17.0)
MCH: 30 pg (ref 26.0–34.0)
MCHC: 31.9 g/dL (ref 30.0–36.0)
MCV: 93.8 fL (ref 80.0–100.0)
Platelets: 196 K/uL (ref 150–400)
RBC: 5.34 MIL/uL (ref 4.22–5.81)
RDW: 16.2 % — ABNORMAL HIGH (ref 11.5–15.5)
WBC: 17.2 K/uL — ABNORMAL HIGH (ref 4.0–10.5)
nRBC: 0 % (ref 0.0–0.2)

## 2024-05-15 LAB — POCT I-STAT 7, (LYTES, BLD GAS, ICA,H+H)
Acid-Base Excess: 2 mmol/L (ref 0.0–2.0)
Bicarbonate: 27.6 mmol/L (ref 20.0–28.0)
Calcium, Ion: 1.17 mmol/L (ref 1.15–1.40)
HCT: 48 % (ref 39.0–52.0)
Hemoglobin: 16.3 g/dL (ref 13.0–17.0)
O2 Saturation: 92 %
Potassium: 4.5 mmol/L (ref 3.5–5.1)
Sodium: 137 mmol/L (ref 135–145)
TCO2: 29 mmol/L (ref 22–32)
pCO2 arterial: 45.3 mmHg (ref 32–48)
pH, Arterial: 7.392 (ref 7.35–7.45)
pO2, Arterial: 66 mmHg — ABNORMAL LOW (ref 83–108)

## 2024-05-15 LAB — RENAL FUNCTION PANEL
Albumin: 3.7 g/dL (ref 3.5–5.0)
Anion gap: 12 (ref 5–15)
BUN: 31 mg/dL — ABNORMAL HIGH (ref 8–23)
CO2: 27 mmol/L (ref 22–32)
Calcium: 8.8 mg/dL — ABNORMAL LOW (ref 8.9–10.3)
Chloride: 99 mmol/L (ref 98–111)
Creatinine, Ser: 1.18 mg/dL (ref 0.61–1.24)
GFR, Estimated: >60 mL/min (ref >60)
Glucose, Bld: 96 mg/dL (ref 70–99)
Phosphorus: 3.4 mg/dL (ref 2.5–4.6)
Potassium: 4 mmol/L (ref 3.5–5.1)
Sodium: 138 mmol/L (ref 135–145)

## 2024-05-15 LAB — APTT
aPTT: 84 s — ABNORMAL HIGH (ref 24–36)
aPTT: 83 s — ABNORMAL HIGH (ref 24–36)

## 2024-05-15 LAB — C DIFFICILE QUICK SCREEN W PCR REFLEX
C Diff antigen: NEGATIVE
C Diff interpretation: NOT DETECTED
C Diff toxin: NEGATIVE

## 2024-05-15 LAB — HEPARIN LEVEL (UNFRACTIONATED): Heparin Unfractionated: 1.06 [IU]/mL — ABNORMAL HIGH (ref 0.30–0.70)

## 2024-05-15 SURGERY — RIGHT HEART CATH AND CORONARY/GRAFT ANGIOGRAPHY
Anesthesia: LOCAL

## 2024-05-15 SURGERY — TRANSESOPHAGEAL ECHOCARDIOGRAM (TEE) (CATHLAB)
Anesthesia: Monitor Anesthesia Care

## 2024-05-15 MED ORDER — PHENYLEPHRINE HCL (PRESSORS) 10 MG/ML IV SOLN
INTRAVENOUS | Status: DC | PRN
  Administered 2024-05-15: 80 ug via INTRAVENOUS

## 2024-05-15 MED ORDER — FENTANYL CITRATE (PF) 100 MCG/2ML IJ SOLN
INTRAMUSCULAR | Status: AC
  Filled 2024-05-15: qty 2

## 2024-05-15 MED ORDER — FENTANYL CITRATE (PF) 100 MCG/2ML IJ SOLN
INTRAMUSCULAR | Status: DC | PRN
  Administered 2024-05-15: 50 ug via INTRAVENOUS

## 2024-05-15 MED ORDER — EMPAGLIFLOZIN 10 MG PO TABS
10.0000 mg | ORAL_TABLET | Freq: Every day | ORAL | Status: DC
  Administered 2024-05-15 – 2024-05-20 (×6): 10 mg via ORAL
  Filled 2024-05-15 (×6): qty 1

## 2024-05-15 MED ORDER — PROPOFOL 10 MG/ML IV BOLUS
INTRAVENOUS | Status: DC | PRN
  Administered 2024-05-15: 20 mg via INTRAVENOUS
  Administered 2024-05-15: 50 mg via INTRAVENOUS

## 2024-05-15 MED ORDER — HEPARIN (PORCINE) IN NACL 1000-0.9 UT/500ML-% IV SOLN
INTRAVENOUS | Status: DC | PRN
  Administered 2024-05-15 (×2): 500 mL

## 2024-05-15 MED ORDER — SODIUM CHLORIDE 0.9% FLUSH: 3.0000 mL | INTRAVENOUS | Status: DC | PRN

## 2024-05-15 MED ORDER — MIDAZOLAM HCL 2 MG/2ML IJ SOLN
INTRAMUSCULAR | Status: AC
  Filled 2024-05-15: qty 2

## 2024-05-15 MED ORDER — HEPARIN SODIUM (PORCINE) 1000 UNIT/ML IJ SOLN
INTRAMUSCULAR | Status: DC | PRN
  Administered 2024-05-15: 4000 [IU] via INTRAVENOUS

## 2024-05-15 MED ORDER — VERAPAMIL HCL 2.5 MG/ML IV SOLN
INTRAVENOUS | Status: AC
  Filled 2024-05-15: qty 2

## 2024-05-15 MED ORDER — HEPARIN SODIUM (PORCINE) 1000 UNIT/ML IJ SOLN
INTRAMUSCULAR | Status: AC
  Filled 2024-05-15: qty 10

## 2024-05-15 MED ORDER — HEPARIN (PORCINE) 25000 UT/250ML-% IV SOLN
1550.0000 [IU]/h | INTRAVENOUS | Status: DC
  Administered 2024-05-16: 1550 [IU]/h via INTRAVENOUS

## 2024-05-15 MED ORDER — LIDOCAINE 2% (20 MG/ML) 5 ML SYRINGE
INTRAMUSCULAR | Status: DC | PRN
  Administered 2024-05-15: 100 mg via INTRAVENOUS

## 2024-05-15 MED ORDER — SODIUM CHLORIDE 0.9 % IV SOLN: 250.0000 mL | INTRAVENOUS | Status: AC | PRN

## 2024-05-15 MED ORDER — HEPARIN (PORCINE) 25000 UT/250ML-% IV SOLN: 1550.0000 [IU]/h | INTRAVENOUS | Status: DC

## 2024-05-15 MED ORDER — LIDOCAINE HCL (PF) 1 % IJ SOLN
INTRAMUSCULAR | Status: DC | PRN
  Administered 2024-05-15: 5 mL via INTRADERMAL

## 2024-05-15 MED ORDER — MIDAZOLAM HCL 2 MG/2ML IJ SOLN
INTRAMUSCULAR | Status: DC | PRN
  Administered 2024-05-15: 1 mg via INTRAVENOUS

## 2024-05-15 MED ORDER — VERAPAMIL HCL 2.5 MG/ML IV SOLN
INTRAVENOUS | Status: DC | PRN
  Administered 2024-05-15: 10 mL via INTRA_ARTERIAL

## 2024-05-15 MED ORDER — IOHEXOL 350 MG/ML SOLN
INTRAVENOUS | Status: DC | PRN
  Administered 2024-05-15: 40 mL

## 2024-05-15 MED ORDER — SODIUM CHLORIDE 0.9 % IV SOLN: 250.0000 mL | INTRAVENOUS | Status: DC | PRN

## 2024-05-15 MED ORDER — LOPERAMIDE HCL 2 MG PO CAPS
4.0000 mg | ORAL_CAPSULE | Freq: Once | ORAL | Status: AC
  Administered 2024-05-15: 4 mg via ORAL
  Filled 2024-05-15: qty 2

## 2024-05-15 MED ORDER — PROPOFOL 500 MG/50ML IV EMUL
INTRAVENOUS | Status: DC | PRN
  Administered 2024-05-15: 110 ug/kg/min via INTRAVENOUS

## 2024-05-15 MED ORDER — SODIUM CHLORIDE 0.9% FLUSH
3.0000 mL | Freq: Two times a day (BID) | INTRAVENOUS | Status: DC
  Administered 2024-05-15: 3 mL via INTRAVENOUS

## 2024-05-15 MED ORDER — FUROSEMIDE 10 MG/ML IJ SOLN
40.0000 mg | Freq: Two times a day (BID) | INTRAMUSCULAR | Status: DC
  Administered 2024-05-15 – 2024-05-16 (×2): 40 mg via INTRAVENOUS
  Filled 2024-05-15 (×2): qty 4

## 2024-05-15 MED ORDER — LIDOCAINE HCL (PF) 1 % IJ SOLN
INTRAMUSCULAR | Status: AC
  Filled 2024-05-15: qty 30

## 2024-05-15 SURGICAL SUPPLY — 13 items
CATH BALLN WEDGE 5F 110CM (CATHETERS) IMPLANT
CATH INFINITI 5 FR IM (CATHETERS) IMPLANT
CATH INFINITI 5FR AL1 (CATHETERS) IMPLANT
CATH INFINITI 5FR MULTPACK ANG (CATHETERS) IMPLANT
COVER PRB 48X5XTLSCP FOLD TPE (BAG) IMPLANT
DEVICE RAD COMP TR BAND LRG (VASCULAR PRODUCTS) IMPLANT
ELECT DEFIB PAD ADLT CADENCE (PAD) IMPLANT
GLIDESHEATH SLEND A-KIT 6F 22G (SHEATH) IMPLANT
GUIDEWIRE INQWIRE 1.5J.035X260 (WIRE) IMPLANT
PACK CARDIAC CATHETERIZATION (CUSTOM PROCEDURE TRAY) ×1 IMPLANT
SET ATX-X65L (MISCELLANEOUS) IMPLANT
SHEATH GLIDE SLENDER 4/5FR (SHEATH) IMPLANT
WIRE BENTSON .035X145CM (WIRE) IMPLANT

## 2024-05-15 NOTE — Interval H&P Note (Signed)
 History and Physical Interval Note:  05/15/2024 3:57 PM  [name]  has presented today for surgery, with the diagnosis of nstemi.  The various methods of treatment have been discussed with the patient and family. After consideration of risks, benefits and other options for treatment, the patient has consented to  Procedure(s): RIGHT/LEFT HEART CATH AND CORONARY ANGIOGRAPHY (N/A) for symptomatic severe aortic stenosis as a surgical intervention.  The patient's history has been reviewed, patient examined, no change in status, stable for surgery.  I have reviewed the patient's chart and labs.  Questions were answered to the patient's satisfaction.     Gordy Bergamo

## 2024-05-15 NOTE — Progress Notes (Signed)
 [facility]                                                                              Kirk [name], is a 78 y.o. male, DOB - Aug 01, 1946, FMW:[id number] Admit date - 05/13/2024    Outpatient Primary MD for the patient is [name], [name], MD  LOS - 2  days  Chief Complaint  Patient presents with   Shortness of Breath       Brief summary    Patient is a 78 year old male with bioprosthetic aortic valve replacement, CAD s/p CABG, hypertension, diabetes mellitus type 2, history of anal cancer, prior history of DVT and saddle embolism on Eliquis  was brought to the ER after patient was having worsening shortness of breath.  Patient had come to the ER about 3 days ago with shortness of breath with cough and wheezing.  At that time patient was diagnosed with possible pneumonia and bronchitis was discharged home on antibiotics and prednisone .  Due to worsening shortness of breath patient presents back to the ER.  Denied any chest pain or abdominal pain.   ED Course: In the ER patient was acutely short of breath and had to be placed on BiPAP.  Chest x-ray shows features concerning for fluid overload was given Lasix  40 mg IV.  Troponin remained flat.  On exam patient was diffusely wheezing, was placed on nebulizer therapy.  COVID flu test were negative respiratory panel was  negative 2 days ago.   proBNP was 2400 troponins were 57 and 52.  WBC count was 16.1.  Assessment & Plan    Principal Problem:   Acute hypoxemic respiratory failure (HCC) Acute on chronic diastolic CHF - Initially placed on BiPAP in ED, improved after Lasix  IV 40 mg x 1 and nebulizer treatments - Multifactorial likely due to acute CHF versus acute bronchitis with PNA, history of PE, BNP 2462 - On IV Lasix  40 mg daily, creatinine stable and improving,  - continue strict I's and O's and daily weights.   - 2D echo showed EF 70 to 75%, severe asymmetric LVH of the basal septal segment, left ventricular  diastolic function could not be evaluated, degenerative mitral valve, trivial MR, moderate mitral annular calcification, repaired aortic valve, trivial AR, moderate AS. - TEE today mild mitral valve stenosis, moderate mitral stenosis with 3 mild jets, plan for cardiac cath today  Acute bronchitis/pneumonia - Chest x-ray shows no dense consolidation, favors pulmonary edema - Continue IV Lasix , IV Rocephin , doxycycline   - No acute wheezing, continue albuterol  as needed   CAD status post CABG, HTN -Currently no chest pain, troponins flat, continue eliquis , beta-blocker, statin - 2D echo showed EF 70 to 75%, severe asymmetric LVH of the basal septal segment, left ventricular diastolic function could not be evaluated, degenerative mitral valve, trivial MR, moderate mitral annular calcification, repaired aortic valve, trivial AR, moderate AS.  History of bioprosthetic aortic valve replacement - 2D echo showed EF 70 to 75%,  repaired aortic valve, trivial AR, moderate AS.  History of saddle PE, on anticoagulation - Continue Eliquis   Mild acute kidney injury - Improving  Diabetes mellitus type 2 -  On Ozempic  and Farxiga  - For now continue sliding scale insulin  while inpatient  Hypothyroidism - Continue Synthroid   Diarrhea - Follow-up C. difficile, GIP  Obesity class I Estimated body mass index is 30.42 kg/m as calculated from the following:   Height as of this encounter: 5' 11 (1.803 m).   Weight as of this encounter: 98.9 kg.  Code Status: Full code DVT Prophylaxis:     Level of Care: Level of care: Progressive Family Communication: Updated patient Disposition Plan:      Remains inpatient appropriate:      Procedures:    Consultants:   Cardiology  Antimicrobials:   Anti-infectives (From admission, onward)    Start     Dose/Rate Route Frequency Ordered Stop   05/14/24 1800  cefTRIAXone  (ROCEPHIN ) 2 g in sodium chloride  0.9 % 100 mL IVPB        2 g 200 mL/hr over  30 Minutes Intravenous Every 24 hours 05/14/24 0536     05/14/24 0630  doxycycline  (VIBRAMYCIN ) 100 mg in sodium chloride  0.9 % 250 mL IVPB        100 mg 125 mL/hr over 120 Minutes Intravenous Every 12 hours 05/14/24 0532     05/13/24 1815  cefTRIAXone  (ROCEPHIN ) 1 g in sodium chloride  0.9 % 100 mL IVPB        1 g 200 mL/hr over 30 Minutes Intravenous  Once 05/13/24 1808 05/13/24 1926   05/13/24 1815  azithromycin  (ZITHROMAX ) 500 mg in sodium chloride  0.9 % 250 mL IVPB        500 mg 250 mL/hr over 60 Minutes Intravenous  Once 05/13/24 1808 05/13/24 2049          Medications  aspirin  EC  81 mg Oral Daily   furosemide   40 mg Intravenous Daily   insulin  aspart  0-9 Units Subcutaneous TID WC   levothyroxine   50 mcg Oral QAC breakfast   loperamide   4 mg Oral Once   metoprolol  tartrate  25 mg Oral BID   mirabegron  ER  25 mg Oral Daily   rosuvastatin   40 mg Oral QHS      Subjective:   Kirk [name] was seen and examined today.  Having diarrhea, fever chills, abdominal pain.  Seen this morning, plan for TEE.    Objective:   Vitals:   05/15/24 1215 05/15/24 1220 05/15/24 1225 05/15/24 1230  BP: 132/83  133/72 122/70  Pulse: 65 65 64 66  Resp: 19 12 19 16   Temp:      TempSrc:      SpO2: 94% 96% 93% 93%  Weight:      Height:        Intake/Output Summary (Last 24 hours) at 05/15/2024 1356 Last data filed at 05/15/2024 1147 Gross per 24 hour  Intake 887.24 ml  Output 2800 ml  Net -1912.76 ml     Wt Readings from Last 3 Encounters:  05/15/24 98.9 kg  04/07/24 99.2 kg  02/04/24 99.2 kg   Physical Exam General: Alert and oriented x 3, NAD Cardiovascular: S1 S2 clear, RRR.  Respiratory: Diminished BS at the bases Gastrointestinal: Soft, nontender, nondistended, NBS Ext: no pedal edema bilaterally Neuro: no new deficits Psych: Normal affect     Data Reviewed:  I have personally reviewed following labs    CBC Lab Results  Component Value Date   WBC 17.2 (H)  05/15/2024   RBC 5.34 05/15/2024   HGB 16.0 05/15/2024   HCT 50.1 05/15/2024   MCV 93.8 05/15/2024  MCH 30.0 05/15/2024   PLT 196 05/15/2024   MCHC 31.9 05/15/2024   RDW 16.2 (H) 05/15/2024   LYMPHSABS 0.8 05/14/2024   MONOABS 1.3 (H) 05/14/2024   EOSABS 0.1 05/14/2024   BASOSABS 0.0 05/14/2024     Last metabolic panel Lab Results  Component Value Date   NA 138 05/15/2024   K 4.0 05/15/2024   CL 99 05/15/2024   CO2 27 05/15/2024   BUN 31 (H) 05/15/2024   CREATININE 1.18 05/15/2024   GLUCOSE 96 05/15/2024   GFRNONAA >60 05/15/2024   GFRAA >60 01/08/2018   CALCIUM  8.8 (L) 05/15/2024   PHOS 3.4 05/15/2024   PROT 7.0 05/14/2024   ALBUMIN  3.7 05/15/2024   LABGLOB 2.8 08/21/2023   BILITOT 1.2 05/14/2024   ALKPHOS 61 05/14/2024   AST 35 05/14/2024   ALT 25 05/14/2024   ANIONGAP 12 05/15/2024    CBG (last 3)  Recent Labs    05/14/24 2143 05/15/24 0616 05/15/24 1351  GLUCAP 118* 107* 107*      Coagulation Profile: No results for input(s): INR, PROTIME in the last 168 hours.   Radiology Studies: I have personally reviewed the imaging studies  EP STUDY Result Date: 05/15/2024 See surgical note for result.  DG Chest 2 View Result Date: 05/14/2024 CLINICAL DATA:  CHF EXAM: CHEST - 2 VIEW COMPARISON:  05/13/2024 FINDINGS: Prior median sternotomy and valve replacement. Chronic increased markings throughout the lungs. No acute confluent opacities or effusions. Heart and mediastinal contours are within normal limits. No acute bony abnormality. IMPRESSION: Stable chronic changes.  No active disease. Electronically Signed   By: Franky Crease M.D.   On: 05/14/2024 18:25   ECHOCARDIOGRAM COMPLETE Result Date: 05/14/2024    ECHOCARDIOGRAM REPORT   Patient Name:   JAMESON TORMEY Date of Exam: 05/14/2024 Medical Rec #:  [id number]      Height:       71.0 in Accession #:    [id number]     Weight:       224.5 lb Date of Birth:  03-Dec-1945      BSA:          2.215 m Patient Age:     78 years       BP:           120/52 mmHg Patient Gender: M              HR:           81 bpm. Exam Location:  Inpatient Procedure: 2D Echo, Cardiac Doppler and Color Doppler (Both Spectral and Color            Flow Doppler were utilized during procedure). Indications:    I50.40* Unspecified combined systolic (congestive) and diastolic                 (congestive) heart failure  History:        Patient has prior history of Echocardiogram examinations, most                 recent 10/01/2023. CAD, Prior CABG and Abnormal ECG, Aortic Valve                 Disease, Arrythmias:Atrial Fibrillation, Signs/Symptoms:Dyspnea                 and Shortness of Breath; Risk Factors:Hypertension and Diabetes.                 Pulmonary embolus.  Aortic Valve: 25 mm Magna valve is present in the aortic                 position. Procedure Date: 2012.  Sonographer:    Ellouise Mose RDCS Referring Phys: [id number] REDIA SAILOR [facility]  Sonographer Comments: Technically difficult study due to poor echo windows. Image acquisition challenging due to patient body habitus. Very difficult to obtain AV gradient. Study interrrupted for PA consult for at least 17 minutes. IMPRESSIONS  1. Left ventricular ejection fraction, by estimation, is 70 to 75%. Left ventricular ejection fraction by 2D MOD biplane is 71.3 %. The left ventricle has hyperdynamic function. The left ventricle has no regional wall motion abnormalities. There is severe asymmetric left ventricular hypertrophy of the basal-septal segment. Left ventricular diastolic function could not be evaluated.  2. Right ventricular systolic function is low normal. The right ventricular size is normal. There is normal pulmonary artery systolic pressure. The estimated right ventricular systolic pressure is 24.9 mmHg.  3. Left atrial size was moderately dilated.  4. The mitral valve is degenerative. Trivial mitral valve regurgitation. Moderate mitral annular calcification.  5. The aortic  valve has been repaired/replaced. Aortic valve regurgitation is trivial. Moderate aortic valve stenosis. There is a 25 mm Magna valve present in the aortic position. Procedure Date: 2012. Echo findings are consistent with normal structure and function of the aortic valve prosthesis. Aortic valve area, by VTI measures 1.36 cm. Aortic valve mean gradient measures 14.0 mmHg. Aortic valve Vmax measures 2.61 m/s.  6. Aortic dilatation noted. There is borderline dilatation of the ascending aorta, measuring 38 mm. Comparison(s): Changes from prior study are noted. 10/01/2023: LVEF 60-65%, bioprosthetic AVR - MG 21 mmHg. FINDINGS  Left Ventricle: Left ventricular ejection fraction, by estimation, is 70 to 75%. Left ventricular ejection fraction by 2D MOD biplane is 71.3 %. The left ventricle has hyperdynamic function. The left ventricle has no regional wall motion abnormalities. The left ventricular internal cavity size was normal in size. There is severe asymmetric left ventricular hypertrophy of the basal-septal segment. Left ventricular diastolic function could not be evaluated due to atrial fibrillation. Left ventricular diastolic function could not be evaluated. Right Ventricle: The right ventricular size is normal. No increase in right ventricular wall thickness. Right ventricular systolic function is low normal. There is normal pulmonary artery systolic pressure. The tricuspid regurgitant velocity is 2.34 m/s,  and with an assumed right atrial pressure of 3 mmHg, the estimated right ventricular systolic pressure is 24.9 mmHg. Left Atrium: Left atrial size was moderately dilated. Right Atrium: Right atrial size was normal in size. Pericardium: There is no evidence of pericardial effusion. Mitral Valve: The mitral valve is degenerative in appearance. Moderate mitral annular calcification. Trivial mitral valve regurgitation. MV peak gradient, 15.3 mmHg. The mean mitral valve gradient is 7.0 mmHg. Tricuspid Valve: The  tricuspid valve is grossly normal. Tricuspid valve regurgitation is trivial. Aortic Valve: The aortic valve has been repaired/replaced. Aortic valve regurgitation is trivial. Moderate aortic stenosis is present. Aortic valve mean gradient measures 14.0 mmHg. Aortic valve peak gradient measures 27.2 mmHg. Aortic valve area, by VTI  measures 1.36 cm. There is a 25 mm Magna valve present in the aortic position. Procedure Date: 2012. Echo findings are consistent with normal structure and function of the aortic valve prosthesis. Pulmonic Valve: The pulmonic valve was normal in structure. Pulmonic valve regurgitation is not visualized. Aorta: Aortic dilatation noted. There is borderline dilatation of the ascending aorta, measuring 38 mm. IAS/Shunts: No  atrial level shunt detected by color flow Doppler.  LEFT VENTRICLE PLAX 2D                        Biplane EF (MOD) LVIDd:         4.49 cm         LV Biplane EF:   Left LVIDs:         3.20 cm                          ventricular LV PW:         1.34 cm                          ejection LV IVS:        1.70 cm                          fraction by LVOT diam:     2.38 cm                          2D MOD LV SV:         73                               biplane is LV SV Index:   33                               71.3 %. LVOT Area:     4.45 cm                                Diastology                                LV e' medial:    5.66 cm/s LV Volumes (MOD)               LV E/e' medial:  33.4 LV vol d, MOD    75.2 ml       LV e' lateral:   8.27 cm/s A2C:                           LV E/e' lateral: 22.9 LV vol d, MOD    83.1 ml A4C: LV vol s, MOD    19.0 ml A2C: LV vol s, MOD    27.2 ml A4C: LV SV MOD A2C:   56.2 ml LV SV MOD A4C:   83.1 ml LV SV MOD BP:    57.2 ml RIGHT VENTRICLE            IVC RV S prime:     6.85 cm/s  IVC diam: 1.97 cm TAPSE (M-mode): 1.7 cm LEFT ATRIUM             Index        RIGHT ATRIUM           Index LA diam:        4.42 cm 2.00 cm/m   RA Area:     16.50  cm LA Vol (A2C):   62.1 ml  28.04 ml/m  RA Volume:   42.00 ml  18.96 ml/m LA Vol (A4C):   89.7 ml 40.50 ml/m LA Biplane Vol: 78.3 ml 35.35 ml/m  AORTIC VALVE AV Area (Vmax):    1.37 cm AV Area (Vmean):   1.43 cm AV Area (VTI):     1.36 cm AV Vmax:           260.67 cm/s AV Vmean:          172.000 cm/s AV VTI:            0.541 m AV Peak Grad:      27.2 mmHg AV Mean Grad:      14.0 mmHg LVOT Vmax:         80.50 cm/s LVOT Vmean:        55.300 cm/s LVOT VTI:          0.165 m LVOT/AV VTI ratio: 0.30  AORTA Ao Root diam: 3.13 cm Ao Asc diam:  3.72 cm MITRAL VALVE                TRICUSPID VALVE MV Area (PHT): 3.99 cm     TR Peak grad:   21.9 mmHg MV Area VTI:   1.56 cm     TR Vmax:        234.00 cm/s MV Peak grad:  15.3 mmHg MV Mean grad:  7.0 mmHg     SHUNTS MV Vmax:       1.96 m/s     Systemic VTI:  0.16 m MV Vmean:      109.9 cm/s   Systemic Diam: 2.38 cm MV Decel Time: 190 msec MV E velocity: 189.00 cm/s MV A velocity: 63.50 cm/s MV E/A ratio:  2.98 Vinie Maxcy MD Electronically signed by Vinie Maxcy MD Signature Date/Time: 05/14/2024/4:38:49 PM    Final    DG Chest 2 View Result Date: 05/13/2024 CLINICAL DATA:  sob, wheezing and crackles. EXAM: CHEST - 2 VIEW COMPARISON:  05/10/2024. FINDINGS: Low lung volume. Mild to moderate diffuse pulmonary vascular congestion. Bilateral lung fields are otherwise clear. No dense consolidation or lung collapse. There bilateral small pleural effusions, blunting the costophrenic angles. Note is made of elevated right hemidiaphragm. Stable cardio-mediastinal silhouette. There are surgical staples along the heart border and sternotomy wires, status post CABG (coronary artery bypass graft). No acute osseous abnormalities. Lower cervical spinal fixation hardware noted. The soft tissues are within normal limits. IMPRESSION: Findings favor congestive heart failure/pulmonary edema. Electronically Signed   By: Ree Molt M.D.   On: 05/13/2024 16:16       Denece Shearer  M.D. [facility] 05/15/2024, 1:56 PM  Available via Epic secure chat 7am-7pm After 7 pm, please refer to night coverage provider listed on amion.

## 2024-05-15 NOTE — CV Procedure (Signed)
    TRANSESOPHAGEAL ECHOCARDIOGRAM   NAME:  Kirk [name]    MRN: [id number] DOB:  Aug 21, 1946    ADMIT DATE: 05/13/2024  INDICATIONS: Mitral Valve regurgitation and stenosis  PROCEDURE:   Informed consent was obtained prior to the procedure. The risks, benefits and alternatives for the procedure were discussed and the patient comprehended these risks.  Risks include, but are not limited to, cough, sore throat, vomiting, nausea, somnolence, esophageal and stomach trauma or perforation, bleeding, low blood pressure, aspiration, pneumonia, infection, trauma to the teeth and death.    Procedural time out performed.   Anesthesia was administered by Dr. Boone and team.  The patient's heart rate, blood pressure, and oxygen saturation are monitored continuously during the procedure.   The transesophageal probe was inserted in the esophagus and stomach without difficulty and multiple views were obtained.   COMPLICATIONS:    There were no immediate complications.  KEY FINDINGS:  Visually, 25 mm Edwards Valve with relatively normal excursion.  RUSB Mean gradient 54 mm.   Borderline LV Stroke Volume index.     Mild Mitral valve stenosis, moderate mitral stenosis with three mild jets. Full report to follow. Further management per primary team. Discussed with Dr. Okey and wife, [name].  May benefit from intraprocedural prosthetic aortic gradient assessment.  Stanly Leavens, MD [place]  [facility]  11:33 AM

## 2024-05-15 NOTE — Progress Notes (Signed)
 PHARMACY - ANTICOAGULATION CONSULT NOTE  Pharmacy Consult for heparin   Indication: history of PE/DVT  Allergies  Allergen Reactions   Cat Dander Shortness Of Breath and Swelling   Hydrocodone  Other (See Comments)    Seizures    Atorvastatin  Other (See Comments)    Joint pain   Dust Mite Extract Other (See Comments) and Swelling   Metformin Other (See Comments)    Diarrhea, muscle cramps   Oxycodone  Other (See Comments)    Seizure PATIENT DOES NOT WANT ANY NARCOTICS   Semaglutide (0.25 Or 0.5mg -Dos) Nausea Only    AT HIGHER DOSE    Patient Measurements: Height: 5' 11 (180.3 cm) Weight: 98.9 kg (218 lb 1.6 oz) IBW/kg (Calculated) : 75.3 HEPARIN  DW (KG): 96.4  Vital Signs: Temp: 97.3 F (36.3 C) (09/11 1710) Temp Source: Oral (09/11 1710) BP: 122/68 (09/11 1710) Pulse Rate: 76 (09/11 1710)  Labs: Recent Labs    05/13/24 1425 05/13/24 1435 05/14/24 0322 05/14/24 0754 05/15/24 0640 05/15/24 1358 05/15/24 1621 05/15/24 1626  HGB 15.3   < >  --  14.4 16.0  --  16.3  16.3 16.3  HCT 49.2   < >  --  46.3 50.1  --  48.0  48.0 48.0  PLT 178  --   --  163 196  --   --   --   APTT  --   --   --   --  84* 83*  --   --   HEPARINUNFRC  --   --   --   --  1.06*  --   --   --   CREATININE 1.28*  --  1.43*  --  1.18  --   --   --    < > = values in this interval not displayed.    Estimated Creatinine Clearance: 63.8 mL/min (by C-G formula based on SCr of 1.18 mg/dL).   Medical History: Past Medical History:  Diagnosis Date   Allergy    Anal cancer (HCC) 04/2012   Aortic stenosis    valve replacement   Arthritis    1995- cerv. 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   CHF (congestive heart failure) (HCC)    Coronary artery disease    aortic stenosis, CAD   Diabetes mellitus    Type 2   DVT (deep venous  thrombosis) (HCC)    ED (erectile dysfunction)    Elevated cholesterol    Heart murmur    no problems   History of kidney stones    passed, lithrotrispy and surgery   History of radiation therapy 05/20/12-06/28/12   anal cancer=54gy total dose   Hypertension    Hypothyroidism    Kidney stones    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   Malignant neoplasm of other sites of rectum, rectosigmoid junction, and anus 04/12/2012   Myocardial infarction (HCC) 2012   Personal history of colonic polyps 03/20/2012   Pneumonia    Pulmonary embolism (HCC)    Rectal bleeding    Rectal mass 03/20/2012   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   #56mm [facility] Ease pericardial tissue valve    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   LIMA to diagonal branch    Seizure disorder, grand mal (HCC)    only 1   Seizures (  HCC)    after taking Oxycodone ; not prescribed seizure med   Shortness of breath    Thrombosed hemorrhoids     Medications:  Medications Prior to Admission  Medication Sig Dispense Refill Last Dose/Taking   apixaban  (ELIQUIS ) 2.5 MG TABS tablet Take 1 tablet by mouth twice daily 180 tablet 1 05/13/2024 at  8:30 AM   aspirin  EC 81 MG tablet Take 81 mg by mouth daily.   05/13/2024 Morning   benzonatate  (TESSALON ) 100 MG capsule Take 1 capsule (100 mg total) by mouth every 8 (eight) hours. 21 capsule 0 05/13/2024 Morning   DULoxetine  (CYMBALTA ) 60 MG capsule Take 1 capsule by mouth at bedtime 90 capsule 0 05/12/2024 Bedtime   FARXIGA  5 MG TABS tablet Take 1 tablet by mouth once daily 90 tablet 0 05/13/2024 Morning   levothyroxine  (SYNTHROID ) 50 MCG tablet Take 1 tablet (50 mcg total) by mouth daily before breakfast. 90 tablet 1 05/13/2024 Morning   losartan  (COZAAR ) 50 MG tablet Take 1 tablet by mouth once daily 90 tablet 0 05/13/2024 Morning   metoprolol  tartrate (LOPRESSOR ) 25 MG tablet Take 1 tablet by mouth twice daily 180 tablet 0 05/13/2024 Morning   MYRBETRIQ  25 MG TB24 tablet Take 1  tablet (25 mg total) by mouth daily. 90 tablet 3 05/13/2024 Morning   OZEMPIC , 0.25 OR 0.5 MG/DOSE, 2 MG/3ML SOPN INJECT 0.25MG   SUBCUTANEOUSLY ONCE A WEEK 3 mL 0 Past Week   predniSONE  (DELTASONE ) 20 MG tablet 3 tabs po day one, then 2 tabs daily x 4 days 11 tablet 0 05/13/2024 Morning   rosuvastatin  (CRESTOR ) 40 MG tablet TAKE 1 TABLET BY MOUTH AT BEDTIME 90 tablet 0 05/12/2024 Bedtime   tadalafil  (CIALIS ) 20 MG tablet Take 20 mg by mouth daily as needed for erectile dysfunction.   Taking As Needed   triamcinolone  cream (KENALOG ) 0.1 % APPLY CREAM EXTERNALLY TO AFFECTED AREA TWICE DAILY AS NEEDED FOR ITCHING   Taking   Vitamins-Lipotropics (LIPO-FLAVONOID PLUS) TABS Take 1 tablet by mouth daily.   05/12/2024   zinc sulfate, 50mg  elemental zinc, 220 (50 Zn) MG capsule Take 220 mg by mouth daily.   05/12/2024 Evening   HYDROmorphone  (DILAUDID ) 2 MG tablet Take 0.5-1 tablets (1-2 mg total) by mouth every 8 (eight) hours as needed for severe pain (pain score 7-10). (Patient not taking: Reported on 05/13/2024) 21 tablet 0 Not Taking   methocarbamol  (ROBAXIN ) 500 MG tablet Take 1 tablet (500 mg total) by mouth every 6 (six) hours as needed for muscle spasms. (Patient not taking: Reported on 05/13/2024) 40 tablet 0 Not Taking   NEEDLE, DISP, 22 G 22G X 1-1/2 MISC 1 each by Does not apply route once a week. Use to administer medication. 50 each 1    ondansetron  (ZOFRAN ) 4 MG tablet Take 1 tablet (4 mg total) by mouth every 6 (six) hours as needed for nausea. (Patient not taking: Reported on 05/13/2024) 20 tablet 0 Not Taking   traMADol  (ULTRAM ) 50 MG tablet Take 1-2 tablets (50-100 mg total) by mouth every 6 (six) hours as needed for moderate pain (pain score 4-6). (Patient not taking: Reported on 05/13/2024) 40 tablet 0 Not Taking   Scheduled:   aspirin  EC  81 mg Oral Daily   empagliflozin   10 mg Oral Daily   furosemide   40 mg Intravenous BID   insulin  aspart  0-9 Units Subcutaneous TID WC   levothyroxine   50 mcg Oral  QAC breakfast   metoprolol  tartrate  25 mg Oral  BID   mirabegron  ER  25 mg Oral Daily   rosuvastatin   40 mg Oral QHS    Assessment: 78 yo male here with HF. He is on apixaban  PTA for history of PE/DVT in 2023 (and history of squamous cell carcinoma).   He is now s/p cath with no CAD and RHC reveals significant volume -heparin  to resume 2 hours post TR band removal -aPTT was at goal on heparin  1550 units/hr   Goal of Therapy:  Heparin  level 0.3-0.7 units/ml aPTT 66-102 seconds Monitor platelets by anticoagulation protocol: Yes   Plan:  -Resume IV heparin  1550 units/hr 2 hours after TR band removal -aPTT and heparin  level in 8 hrs  Prentice Poisson, PharmD Clinical Pharmacist **Pharmacist phone directory can now be found on [email or web address] ([email or web address]).  Listed under [facility].

## 2024-05-15 NOTE — Anesthesia Preprocedure Evaluation (Signed)
 Anesthesia Evaluation  Patient identified by MRN, date of birth, ID band Patient awake    Reviewed: Allergy & Precautions, NPO status , Patient's Chart, lab work & pertinent test results, reviewed documented beta blocker date and time   History of Anesthesia Complications Negative for: history of anesthetic complications  Airway Mallampati: II  TM Distance: >3 FB Neck ROM: Full    Dental no notable dental hx. (+) Teeth Intact, Dental Advisory Given   Pulmonary neg sleep apnea, neg COPD, Patient abstained from smoking.Not current smoker, PE   Pulmonary exam normal breath sounds clear to auscultation       Cardiovascular Exercise Tolerance: Good METShypertension, Pt. on home beta blockers and Pt. on medications + CAD, + Past MI, + CABG, +CHF and + DVT  Normal cardiovascular exam+ dysrhythmias + Valvular Problems/Murmurs (s/p AVR) AS  Rhythm:Regular Rate:Normal - Systolic murmurs Echo 10/01/2023:   IMPRESSIONS      1. Left ventricular ejection fraction, by estimation, is 60 to 65%. The left ventricle has normal function. The left ventricle has no regional wall motion abnormalities. There is mild concentric left ventricular hypertrophy. Left ventricular diastolic parameters are consistent with Grade II diastolic dysfunction (pseudonormalization).  2. Right ventricular systolic function is normal. The right ventricular size is normal. There is normal pulmonary artery systolic pressure. The estimated right ventricular systolic pressure is 20.8 mmHg.  3. Left atrial size was mildly dilated.  4. The mitral valve is degenerative. Trivial mitral valve regurgitation. No evidence of mitral stenosis. Moderate mitral annular calcification.  5. Bioprosthetic aortic valve, leaflets not well-visualized. Mean gradient 21 mmHg, dimensionless index 0.3. The gradient across the valve is elevated, mildly higher than prior (16 mmHg). No  peri-valvular leakage.  6. The inferior vena cava is normal in size with greater than 50% respiratory variability, suggesting right atrial pressure of 3 mmHg.    Neuro/Psych neg Seizures  negative psych ROS   GI/Hepatic negative GI ROS, Neg liver ROS,neg GERD  ,,  Endo/Other  diabetes, Type 2Hypothyroidism    Renal/GU CRFRenal disease  negative genitourinary   Musculoskeletal  (+) Arthritis ,    Abdominal   Peds  Hematology negative hematology ROS (+)   Anesthesia Other Findings Past Medical History: No date: Allergy 04/2012: Anal cancer (HCC) No date: Aortic stenosis     Comment:  valve replacement No date: Arthritis     Comment:  1995- cerv. 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 No date: CHF (congestive heart failure) (HCC) No date: Coronary artery disease     Comment:  aortic stenosis, CAD No date: Diabetes mellitus     Comment:  Type 2 No date: DVT (deep venous thrombosis) (HCC) No date: ED (erectile dysfunction) No date: Elevated cholesterol No date: Heart murmur     Comment:  no problems No date: History of kidney stones     Comment:  passed, lithrotrispy and surgery 05/20/12-06/28/12: History of radiation therapy     Comment:  anal cancer=54gy total dose No date: Hypertension No date: Hypothyroidism No date: Kidney stones 05/12/2012: Malignant neoplasm of anal canal (HCC) 04/12/2012: Malignant neoplasm of other sites of rectum, rectosigmoid  junction, and anus 2012: Myocardial infarction (HCC) 03/20/2012: Personal history of colonic polyps No date: Pneumonia No date: Pulmonary embolism (HCC) No date: Rectal bleeding 03/20/2012: Rectal mass  08/16/2011: S/P AVR (aortic valve replacement)     Comment:  #78mm [employer] Ease pericardial tissue valve  08/16/2011: S/P  CABG x 1     Comment:  LIMA to diagonal branch  No date: Seizure disorder, grand mal (HCC)     Comment:  only 1 No date: Seizures (HCC)     Comment:  after taking Oxycodone ; not prescribed seizure med No date: Shortness of breath No date: Thrombosed hemorrhoids  Reproductive/Obstetrics                              Anesthesia Physical Anesthesia Plan  ASA: 3  Anesthesia Plan: MAC   Post-op Pain Management: Minimal or no pain anticipated   Induction: Intravenous  PONV Risk Score and Plan: 1 and Propofol  infusion, TIVA, Ondansetron  and Treatment may vary due to age or medical condition  Airway Management Planned: Nasal Cannula  Additional Equipment: None  Intra-op Plan:   Post-operative Plan:   Informed Consent: I have reviewed the patients History and Physical, chart, labs and discussed the procedure including the risks, benefits and alternatives for the proposed anesthesia with the patient or authorized representative who has indicated his/her understanding and acceptance.     Dental advisory given  Plan Discussed with: CRNA and Surgeon  Anesthesia Plan Comments: (Discussed risks of anesthesia with patient, including possibility of difficulty with spontaneous ventilation under anesthesia necessitating airway intervention, PONV, and rare risks such as cardiac or respiratory or neurological events, and allergic reactions. Discussed the role of CRNA in patient's perioperative care. Patient understands.)         Anesthesia Quick Evaluation

## 2024-05-15 NOTE — Transfer of Care (Signed)
 Immediate Anesthesia Transfer of Care Note  Patient: Ludger Bones Lokken  Procedure(s) Performed: TRANSESOPHAGEAL ECHOCARDIOGRAM  Patient Location: PACU  Anesthesia Type:MAC  Level of Consciousness: awake, alert , and oriented  Airway & Oxygen Therapy: Patient Spontanous Breathing and Patient connected to nasal cannula oxygen  Post-op Assessment: Report given to RN and Post -op Vital signs reviewed and stable  Post vital signs: Reviewed and stable  Last Vitals:  Vitals Value Taken Time  BP    Temp    Pulse    Resp    SpO2      Last Pain:  Vitals:   05/15/24 0938  TempSrc:   PainSc: 0-No pain         Complications: No notable events documented.

## 2024-05-15 NOTE — Interval H&P Note (Signed)
 History and Physical Interval Note:  05/15/2024 9:53 AM  Kirk [name] Gilkeson  has presented today for surgery, with the diagnosis of MR.  The various methods of treatment have been discussed with the patient and family. After consideration of risks, benefits and other options for treatment, the patient has consented to  Procedure(s): TRANSESOPHAGEAL ECHOCARDIOGRAM (N/A) as a surgical intervention.  The patient's history has been reviewed, patient examined, no change in status, stable for surgery.  I have reviewed the patient's chart and labs.  Questions were answered to the patient's satisfaction.    Patient has been consented for Left and right heart catheterization.  This would be a separate procedure based on results of this procedure.   Kirk [name]

## 2024-05-15 NOTE — Progress Notes (Signed)
 PHARMACY - ANTICOAGULATION CONSULT NOTE  Pharmacy Consult for heparin   Indication: history of PE/DVT  Allergies  Allergen Reactions   Cat Dander Shortness Of Breath and Swelling   Hydrocodone  Other (See Comments)    Seizures    Atorvastatin  Other (See Comments)    Joint pain   Dust Mite Extract Other (See Comments) and Swelling   Metformin Other (See Comments)    Diarrhea, muscle cramps   Oxycodone  Other (See Comments)    Seizure PATIENT DOES NOT WANT ANY NARCOTICS   Semaglutide (0.25 Or 0.5mg -Dos) Nausea Only    AT HIGHER DOSE    Patient Measurements: Height: 5' 11 (180.3 cm) Weight: 98.9 kg (218 lb 1.6 oz) IBW/kg (Calculated) : 75.3 HEPARIN  DW (KG): 96.4  Vital Signs: Temp: 97.9 F (36.6 C) (09/11 0343) Temp Source: Oral (09/11 0343) BP: 125/91 (09/11 0343) Pulse Rate: 70 (09/11 0343)  Labs: Recent Labs    05/13/24 1425 05/13/24 1435 05/14/24 0322 05/14/24 0754 05/15/24 0640  HGB 15.3 17.0  --  14.4 16.0  HCT 49.2 50.0  --  46.3 50.1  PLT 178  --   --  163 196  APTT  --   --   --   --  84*  HEPARINUNFRC  --   --   --   --  1.06*  CREATININE 1.28*  --  1.43*  --  1.18    Estimated Creatinine Clearance: 63.8 mL/min (by C-G formula based on SCr of 1.18 mg/dL).   Medical History: Past Medical History:  Diagnosis Date   Allergy    Anal cancer (HCC) 04/2012   Aortic stenosis    valve replacement   Arthritis    1995- cerv. 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   CHF (congestive heart failure) (HCC)    Coronary artery disease    aortic stenosis, CAD   Diabetes mellitus    Type 2   DVT (deep venous thrombosis) (HCC)    ED (erectile dysfunction)    Elevated cholesterol    Heart murmur    no problems   History of kidney stones    passed, lithrotrispy and surgery   History of radiation therapy  05/20/12-06/28/12   anal cancer=54gy total dose   Hypertension    Hypothyroidism    Kidney stones    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   Malignant neoplasm of other sites of rectum, rectosigmoid junction, and anus 04/12/2012   Myocardial infarction (HCC) 2012   Personal history of colonic polyps 03/20/2012   Pneumonia    Pulmonary embolism (HCC)    Rectal bleeding    Rectal mass 03/20/2012   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   #84mm [facility] Ease pericardial tissue valve    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   LIMA to diagonal branch    Seizure disorder, grand mal (HCC)    only 1   Seizures (HCC)    after taking Oxycodone ; not prescribed seizure med   Shortness of breath    Thrombosed hemorrhoids     Medications:  Medications Prior to Admission  Medication Sig Dispense Refill Last Dose/Taking   apixaban  (ELIQUIS ) 2.5 MG TABS tablet Take 1 tablet by mouth twice daily 180 tablet 1 05/13/2024 at  8:30 AM   aspirin  EC 81 MG tablet Take 81 mg  by mouth daily.   05/13/2024 Morning   benzonatate  (TESSALON ) 100 MG capsule Take 1 capsule (100 mg total) by mouth every 8 (eight) hours. 21 capsule 0 05/13/2024 Morning   DULoxetine  (CYMBALTA ) 60 MG capsule Take 1 capsule by mouth at bedtime 90 capsule 0 05/12/2024 Bedtime   FARXIGA  5 MG TABS tablet Take 1 tablet by mouth once daily 90 tablet 0 05/13/2024 Morning   levothyroxine  (SYNTHROID ) 50 MCG tablet Take 1 tablet (50 mcg total) by mouth daily before breakfast. 90 tablet 1 05/13/2024 Morning   losartan  (COZAAR ) 50 MG tablet Take 1 tablet by mouth once daily 90 tablet 0 05/13/2024 Morning   metoprolol  tartrate (LOPRESSOR ) 25 MG tablet Take 1 tablet by mouth twice daily 180 tablet 0 05/13/2024 Morning   MYRBETRIQ  25 MG TB24 tablet Take 1 tablet (25 mg total) by mouth daily. 90 tablet 3 05/13/2024 Morning   OZEMPIC , 0.25 OR 0.5 MG/DOSE, 2 MG/3ML SOPN INJECT 0.25MG   SUBCUTANEOUSLY ONCE A WEEK 3 mL 0 Past Week   predniSONE  (DELTASONE ) 20 MG  tablet 3 tabs po day one, then 2 tabs daily x 4 days 11 tablet 0 05/13/2024 Morning   rosuvastatin  (CRESTOR ) 40 MG tablet TAKE 1 TABLET BY MOUTH AT BEDTIME 90 tablet 0 05/12/2024 Bedtime   tadalafil  (CIALIS ) 20 MG tablet Take 20 mg by mouth daily as needed for erectile dysfunction.   Taking As Needed   triamcinolone  cream (KENALOG ) 0.1 % APPLY CREAM EXTERNALLY TO AFFECTED AREA TWICE DAILY AS NEEDED FOR ITCHING   Taking   Vitamins-Lipotropics (LIPO-FLAVONOID PLUS) TABS Take 1 tablet by mouth daily.   05/12/2024   zinc sulfate, 50mg  elemental zinc, 220 (50 Zn) MG capsule Take 220 mg by mouth daily.   05/12/2024 Evening   HYDROmorphone  (DILAUDID ) 2 MG tablet Take 0.5-1 tablets (1-2 mg total) by mouth every 8 (eight) hours as needed for severe pain (pain score 7-10). (Patient not taking: Reported on 05/13/2024) 21 tablet 0 Not Taking   methocarbamol  (ROBAXIN ) 500 MG tablet Take 1 tablet (500 mg total) by mouth every 6 (six) hours as needed for muscle spasms. (Patient not taking: Reported on 05/13/2024) 40 tablet 0 Not Taking   NEEDLE, DISP, 22 G 22G X 1-1/2 MISC 1 each by Does not apply route once a week. Use to administer medication. 50 each 1    ondansetron  (ZOFRAN ) 4 MG tablet Take 1 tablet (4 mg total) by mouth every 6 (six) hours as needed for nausea. (Patient not taking: Reported on 05/13/2024) 20 tablet 0 Not Taking   traMADol  (ULTRAM ) 50 MG tablet Take 1-2 tablets (50-100 mg total) by mouth every 6 (six) hours as needed for moderate pain (pain score 4-6). (Patient not taking: Reported on 05/13/2024) 40 tablet 0 Not Taking   Scheduled:   aspirin  EC  81 mg Oral Daily   furosemide   40 mg Intravenous Daily   insulin  aspart  0-9 Units Subcutaneous TID WC   levothyroxine   50 mcg Oral QAC breakfast   metoprolol  tartrate  25 mg Oral BID   mirabegron  ER  25 mg Oral Daily   rosuvastatin   40 mg Oral QHS   sodium chloride  flush  3 mL Intravenous Q12H    Assessment: 78 yo male here with HF. He is on apixaban  PTA for  history of PE/DVT in 2023 (and history of squamous cell carcinoma). Plans are for possible RHC  Bridging for apixaban . Plan for TEE the possible cath. Cbc remains stable. PTT came back therapeutic this AM.  Not correlating with heparin  level yet.   Goal of Therapy:  Heparin  level 0.3-0.7 units/ml aPTT 66-102 seconds Monitor platelets by anticoagulation protocol: Yes   Plan:  -Cont IV heparin  1550 units/hr  -Confirm heparin  aPTT in 8 hrs  Sergio Batch, PharmD, [place], AAHIVP, CPP Infectious Disease Pharmacist 05/15/2024 8:05 AM

## 2024-05-15 NOTE — Anesthesia Postprocedure Evaluation (Signed)
 Anesthesia Post Note  Patient: Kirk [name]  Procedure(s) Performed: TRANSESOPHAGEAL ECHOCARDIOGRAM     Patient location during evaluation: Cath Lab Anesthesia Type: MAC Level of consciousness: awake and alert Pain management: pain level controlled Vital Signs Assessment: post-procedure vital signs reviewed and stable Respiratory status: spontaneous breathing, nonlabored ventilation, respiratory function stable and patient connected to nasal cannula oxygen Cardiovascular status: stable and blood pressure returned to baseline Postop Assessment: no apparent nausea or vomiting Anesthetic complications: no   No notable events documented.  Last Vitals:  Vitals:   05/15/24 1225 05/15/24 1230  BP: 133/72 122/70  Pulse: 64 66  Resp: 19 16  Temp:    SpO2: 93% 93%    Last Pain:  Vitals:   05/15/24 1150  TempSrc:   PainSc: 0-No pain                 Rome Ade

## 2024-05-16 ENCOUNTER — Telehealth: Payer: Self-pay | Admitting: Cardiology

## 2024-05-16 ENCOUNTER — Inpatient Hospital Stay (HOSPITAL_COMMUNITY)

## 2024-05-16 ENCOUNTER — Other Ambulatory Visit (HOSPITAL_COMMUNITY): Payer: Self-pay

## 2024-05-16 ENCOUNTER — Encounter (HOSPITAL_COMMUNITY): Payer: Self-pay | Admitting: Internal Medicine

## 2024-05-16 ENCOUNTER — Telehealth (HOSPITAL_COMMUNITY): Payer: Self-pay | Admitting: Pharmacy Technician

## 2024-05-16 DIAGNOSIS — J9601 Acute respiratory failure with hypoxia: Secondary | ICD-10-CM | POA: Diagnosis not present

## 2024-05-16 DIAGNOSIS — T82857A Stenosis of cardiac prosthetic devices, implants and grafts, initial encounter: Secondary | ICD-10-CM | POA: Diagnosis not present

## 2024-05-16 DIAGNOSIS — I350 Nonrheumatic aortic (valve) stenosis: Secondary | ICD-10-CM

## 2024-05-16 DIAGNOSIS — I5031 Acute diastolic (congestive) heart failure: Secondary | ICD-10-CM | POA: Diagnosis not present

## 2024-05-16 DIAGNOSIS — I509 Heart failure, unspecified: Secondary | ICD-10-CM | POA: Diagnosis not present

## 2024-05-16 LAB — RENAL FUNCTION PANEL
Albumin: 3.6 g/dL (ref 3.5–5.0)
Anion gap: 14 (ref 5–15)
BUN: 31 mg/dL — ABNORMAL HIGH (ref 8–23)
CO2: 24 mmol/L (ref 22–32)
Calcium: 8.9 mg/dL (ref 8.9–10.3)
Chloride: 98 mmol/L (ref 98–111)
Creatinine, Ser: 1.2 mg/dL (ref 0.61–1.24)
GFR, Estimated: >60 mL/min (ref >60)
Glucose, Bld: 95 mg/dL (ref 70–99)
Phosphorus: 3.8 mg/dL (ref 2.5–4.6)
Potassium: 4.2 mmol/L (ref 3.5–5.1)
Sodium: 136 mmol/L (ref 135–145)

## 2024-05-16 LAB — CBC
HCT: 50.6 % (ref 39.0–52.0)
Hemoglobin: 16.1 g/dL (ref 13.0–17.0)
MCH: 30.1 pg (ref 26.0–34.0)
MCHC: 31.8 g/dL (ref 30.0–36.0)
MCV: 94.8 fL (ref 80.0–100.0)
Platelets: 155 K/uL (ref 150–400)
RBC: 5.34 MIL/uL (ref 4.22–5.81)
RDW: 15.7 % — ABNORMAL HIGH (ref 11.5–15.5)
WBC: 12.1 K/uL — ABNORMAL HIGH (ref 4.0–10.5)
nRBC: 0 % (ref 0.0–0.2)

## 2024-05-16 LAB — GLUCOSE, CAPILLARY
Glucose-Capillary: 100 mg/dL — ABNORMAL HIGH (ref 70–99)
Glucose-Capillary: 133 mg/dL — ABNORMAL HIGH (ref 70–99)
Glucose-Capillary: 117 mg/dL — ABNORMAL HIGH (ref 70–99)
Glucose-Capillary: 120 mg/dL — ABNORMAL HIGH (ref 70–99)

## 2024-05-16 MED ORDER — APIXABAN 2.5 MG PO TABS
2.5000 mg | ORAL_TABLET | Freq: Two times a day (BID) | ORAL | Status: DC
  Administered 2024-05-16: 2.5 mg via ORAL
  Filled 2024-05-16: qty 1

## 2024-05-16 MED ORDER — FUROSEMIDE 10 MG/ML IJ SOLN
80.0000 mg | Freq: Two times a day (BID) | INTRAMUSCULAR | Status: DC
  Administered 2024-05-16: 80 mg via INTRAVENOUS
  Filled 2024-05-16: qty 8

## 2024-05-16 MED ORDER — HEPARIN (PORCINE) 25000 UT/250ML-% IV SOLN
1550.0000 [IU]/h | INTRAVENOUS | Status: DC
  Administered 2024-05-16 – 2024-05-18 (×4): 1550 [IU]/h via INTRAVENOUS
  Filled 2024-05-16 (×4): qty 250

## 2024-05-16 MED ORDER — MELATONIN 5 MG PO TABS
5.0000 mg | ORAL_TABLET | Freq: Every evening | ORAL | Status: DC | PRN
  Administered 2024-05-16 – 2024-05-18 (×3): 5 mg via ORAL
  Filled 2024-05-16 (×3): qty 1

## 2024-05-16 MED ORDER — IOHEXOL 350 MG/ML SOLN
100.0000 mL | Freq: Once | INTRAVENOUS | Status: AC | PRN
  Administered 2024-05-16: 100 mL via INTRAVENOUS

## 2024-05-16 MED ORDER — SODIUM CHLORIDE 0.9 % IV SOLN
100.0000 mg | Freq: Two times a day (BID) | INTRAVENOUS | Status: AC
  Administered 2024-05-16 – 2024-05-18 (×5): 100 mg via INTRAVENOUS
  Filled 2024-05-16 (×5): qty 100

## 2024-05-16 NOTE — Consult Note (Cosign Needed Addendum)
 [facility]ART [facility] VASCULAR CENTER   MULTIDISCIPLINARY HEART VALVE TEAM  Cardiology Consultation:   Patient ID: Kirk [name] MRN: [id number]; DOB: [birth year]  Admit date: 05/13/2024 Date of Consult: 05/16/2024  Primary Care Provider: [name], [name], MD [facility] Cardiologist: Oneil Parchment, MD  [facility] Electrophysiologist:  None    Patient Profile:   Kirk [name] is a 78 y.o. 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 Okey.  History of Present Illness:   Mr. Stetzer underwent AVR with a 25 mm [facility] Ease and CAB x1 with LIMA--> D1 with Dr. Dusty in 2012. He has been followed by Dr. Parchment since. In 2023 patient had an acute DVT to the right femoral, popliteal, peroneum, neo-vein, and gastrinomas veins with an acute saddle PE resulting in right heart strain.  He was put on Eliquis  and will continue on Eliquis  lifelong given a prior history of PE and DVT.  Echo in 09/2023 showed EF 60-65% with mild concentric LVH, GD22, s/p AVR with mean gradient of 21 mm hg and AVA 0.92 cm2, SVI 33. He was last seen by Dr. Parchment 08/2023 for preoperative evaluation for knee replacement.  At that time patient was doing overall well. Patient had his knee replacement 11/2023. 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 He lives with his wife, Kirk [name], in [place] and has two daughters. Of note, he has all of his natural teeth with excellent dental health (wife worked for a Education officer, community).   He was in his usual state of health until 05/10/24 when he presented to the ED with increasing shortness of breath and productive cough. At that time he was diagnosed with bronchitis and  given a prescription of prednisone , antitussive , and an albuterol  inhaler.   He then represented to the ED on 05/13/24 for worsening shortness of breath and productive cough. He was tachypneic and hypoxic with SpO2 in the 80s requiring BIPAP. He was given IV Lasix  40 mg x 2. Later that evening he became hypotensive to 77/58.  He was given 500 mL of NS bolus and started on IV antibiotics with IV Rocephin  and doxycycline .    Pertinent lab work K 5.2 Creatinine 1.28 ->1.48  proBNP 2462 Troponin 57 -> 52 WBC 16.1 -> 19.5 with elevated neutrophils.   EKG shows sinus rhythm with RAD & TWI in inferior leads and V5-V6, VR 71 [RAD and TWI are new compared to prior]. CXR shows mild to moderate pulmonary congestion and bilateral small.  TTE 05/14/24 showed EF 70-75%, severe asymmetric left ventricular hypertrophy of the basal-septal segment, low normal RV function, RSVP 24.9 mmhg, mod MAC, mod bioprosthetic AS with mean gradient 14 mm hg, AVA 1.36 cm2.   TEE 05/15/24 showed EF 60-65%, severe MAC with mod- severe MR (3 jets at A1P1, A2P2, and A3,P3. Summation of 3D VCA 0.562 (severe) but with only systolic blunting of the pulmonary vein flow. and mild MS (not good mTEER candidate due to protruding calcium  at a2 and small AVA), and severe bioprosthetic AS with mean gradient of 53.5 mm hg, Vmax 4.79 m/s, and AVA 0.69 cm2.   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  PAPi preserved suggesting preserved RV function. He is being diuresed with IV lasix  with a symptomatic improvement.    Past Medical History:  Diagnosis Date   Allergy    Anal cancer (HCC) 04/2012   Aortic stenosis    valve replacement   Arthritis    1995- cerv. fusion- [facility]   Atrial fibrillation (HCC) 08/20/2011   Post op heart surgery, no problems since, no meds   Blood in stool    Cancer  (HCC) 04/09/2012   Rectum bx=invasive squamous cell carcinoma   CHF (congestive heart failure) (HCC)    Coronary artery disease    aortic stenosis, CAD   Diabetes mellitus    Type 2   DVT (deep venous thrombosis) (HCC)    ED (erectile dysfunction)    Elevated cholesterol    Heart murmur    no problems   History of kidney stones    passed, lithrotrispy and surgery   History of radiation therapy 05/20/12-06/28/12   anal cancer=54gy total dose   Hypertension    Hypothyroidism    Kidney stones    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   Malignant neoplasm of other sites of rectum, rectosigmoid junction, and anus 04/12/2012   Myocardial infarction (HCC) 2012   Personal history of colonic polyps 03/20/2012   Pneumonia    Pulmonary embolism (HCC)    Rectal bleeding    Rectal mass 03/20/2012   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   #59mm [facility] Ease pericardial tissue valve    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   LIMA to diagonal branch    Seizure disorder, grand mal (HCC)    only 1   Seizures (HCC)    after taking Oxycodone ; not prescribed seizure med   Shortness of breath    Thrombosed hemorrhoids     Past Surgical History:  Procedure Laterality Date   ANAL FISSURE REPAIR     patient unsure of date   ANTERIOR CERVICAL DECOMP/DISCECTOMY FUSION N/A 01/22/2018   Procedure: ANTERIOR CERVICAL DECOMPRESSION/DISCECTOMY FUSION CERVICAL SIX- CERVICAL SEVEN;  Surgeon: Unice Pac, MD;  Location: [facility] OR;  Service: Neurosurgery;  Laterality: N/A;  ANTERIOR CERVICAL DECOMPRESSION/DISCECTOMY FUSION CERVICAL SIX- CERVICAL SEVEN   AORTIC VALVE REPLACEMENT  08/11/2011   Procedure: AORTIC VALVE REPLACEMENT (AVR);  Surgeon: Sudie VEAR Laine, MD;  Location: [facility] OR;  Service: Open Heart Surgery;  Laterality: N/A;   C4-6 FUSION  1995   DR STERN   COLONOSCOPY     hx polyps   CORONARY ARTERY BYPASS GRAFT  08/16/2011   Procedure: CORONARY ARTERY BYPASS GRAFTING (CABG);  Surgeon:  Sudie VEAR Laine, MD;  Location: [facility] OR;  Service: Open Heart Surgery;  Laterality: N/A;  CABG times one using left internal mammary artery   LITHOTRIPSY  2001 AND 2002   DR PETERSON   POLYPECTOMY     colon   RIGHT HEART CATH AND CORONARY/GRAFT ANGIOGRAPHY N/A 05/15/2024   Procedure: RIGHT HEART CATH AND CORONARY/GRAFT ANGIOGRAPHY;  Surgeon: Ladona Heinz, MD;  Location: [facility] INVASIVE CV LAB;  Service: Cardiovascular;  Laterality: N/A;   TOTAL KNEE ARTHROPLASTY Right 06/18/2023   Procedure: RIGHT TOTAL KNEE ARTHROPLASTY;  Surgeon: Melodi Lerner, MD;  Location: [facility] ORS;  Service: Orthopedics;  Laterality: Right;   TOTAL KNEE ARTHROPLASTY Left 11/05/2023   Procedure: ARTHROPLASTY, KNEE, TOTAL;  Surgeon: Melodi,  Dempsey, MD;  Location: [facility] ORS;  Service: Orthopedics;  Laterality: Left;   TRANSESOPHAGEAL ECHOCARDIOGRAM (CATH LAB) N/A 05/15/2024   Procedure: TRANSESOPHAGEAL ECHOCARDIOGRAM;  Surgeon: Santo Stanly LABOR, MD;  Location: [facility] INVASIVE CV LAB;  Service: Cardiovascular;  Laterality: N/A;     Home Medications:  Prior to Admission medications   Medication Sig Start Date End Date Taking? Authorizing Provider  apixaban  (ELIQUIS ) 2.5 MG TABS tablet Take 1 tablet by mouth twice daily 03/17/24  Yes Cloretta Arley NOVAK, MD  aspirin  EC 81 MG tablet Take 81 mg by mouth daily.   Yes [provider]  benzonatate  (TESSALON ) 100 MG capsule Take 1 capsule (100 mg total) by mouth every 8 (eight) hours. 05/10/24  Yes Nivia Colon, PA-C  DULoxetine  (CYMBALTA ) 60 MG capsule Take 1 capsule by mouth at bedtime 04/23/24  Yes [name], Raymond J, MD  FARXIGA  5 MG TABS tablet Take 1 tablet by mouth once daily 04/23/24  Yes [name], [name], MD  levothyroxine  (SYNTHROID ) 50 MCG tablet Take 1 tablet (50 mcg total) by mouth daily before breakfast. 02/05/24  Yes [name], Raymond J, MD  losartan  (COZAAR ) 50 MG tablet Take 1 tablet by mouth once daily 04/23/24  Yes [name], [name], MD  metoprolol  tartrate (LOPRESSOR ) 25 MG  tablet Take 1 tablet by mouth twice daily 03/17/24  Yes [name], Raymond J, MD  MYRBETRIQ  25 MG TB24 tablet Take 1 tablet (25 mg total) by mouth daily. 06/14/23  Yes [name], Raymond J, MD  OZEMPIC , 0.25 OR 0.5 MG/DOSE, 2 MG/3ML SOPN INJECT 0.25MG   SUBCUTANEOUSLY ONCE A WEEK 05/06/24  Yes [name], Raymond J, MD  predniSONE  (DELTASONE ) 20 MG tablet 3 tabs po day one, then 2 tabs daily x 4 days 05/10/24  Yes Nivia Colon, PA-C  rosuvastatin  (CRESTOR ) 40 MG tablet TAKE 1 TABLET BY MOUTH AT BEDTIME 03/25/24  Yes [name], Raymond J, MD  tadalafil  (CIALIS ) 20 MG tablet Take 20 mg by mouth daily as needed for erectile dysfunction. 03/20/24 06/18/24 Yes [provider]  triamcinolone  cream (KENALOG ) 0.1 % APPLY CREAM EXTERNALLY TO AFFECTED AREA TWICE DAILY AS NEEDED FOR ITCHING   Yes [provider]  Vitamins-Lipotropics (LIPO-FLAVONOID PLUS) TABS Take 1 tablet by mouth daily.   Yes [provider]  zinc sulfate, 50mg  elemental zinc, 220 (50 Zn) MG capsule Take 220 mg by mouth daily.   Yes [provider]  HYDROmorphone  (DILAUDID ) 2 MG tablet Take 0.5-1 tablets (1-2 mg total) by mouth every 8 (eight) hours as needed for severe pain (pain score 7-10). Patient not taking: Reported on 05/13/2024 11/06/23   Edmisten, Roxie L, PA  methocarbamol  (ROBAXIN ) 500 MG tablet Take 1 tablet (500 mg total) by mouth every 6 (six) hours as needed for muscle spasms. Patient not taking: Reported on 05/13/2024 11/06/23   Edmisten, Roxie L, PA  NEEDLE, DISP, 22 G 22G X 1-1/2 MISC 1 each by Does not apply route once a week. Use to administer medication. 02/28/23   [name], [name], MD  ondansetron  (ZOFRAN ) 4 MG tablet Take 1 tablet (4 mg total) by mouth every 6 (six) hours as needed for nausea. Patient not taking: Reported on 05/13/2024 11/06/23   Edmisten, Roxie CROME, PA  traMADol  (ULTRAM ) 50 MG tablet Take 1-2 tablets (50-100 mg total) by mouth every 6 (six) hours as needed for moderate pain (pain score  4-6). Patient not taking: Reported on 05/13/2024 11/06/23 11/05/24  Reena Roxie CROME, PA  Inpatient Medications: Scheduled Meds:  aspirin  EC  81 mg Oral Daily   empagliflozin   10 mg Oral Daily   furosemide   80 mg Intravenous BID   insulin  aspart  0-9 Units Subcutaneous TID WC   levothyroxine   50 mcg Oral QAC breakfast   metoprolol  tartrate  25 mg Oral BID   mirabegron  ER  25 mg Oral Daily   rosuvastatin   40 mg Oral QHS   Continuous Infusions:  sodium chloride      cefTRIAXone  (ROCEPHIN )  IV Stopped (05/15/24 2106)   doxycycline  (VIBRAMYCIN ) IV 100 mg (05/16/24 1050)   heparin      PRN Meds: sodium chloride , acetaminophen  **OR** acetaminophen , albuterol , sodium chloride  flush  Allergies:    Allergies  Allergen Reactions   Cat Dander Shortness Of Breath and Swelling   Hydrocodone  Other (See Comments)    Seizures    Atorvastatin  Other (See Comments)    Joint pain   Dust Mite Extract Other (See Comments) and Swelling   Metformin Other (See Comments)    Diarrhea, muscle cramps   Oxycodone  Other (See Comments)    Seizure PATIENT DOES NOT WANT ANY NARCOTICS   Semaglutide (0.25 Or 0.5mg -Dos) Nausea Only    AT HIGHER DOSE    Social History:   Social History   Socioeconomic History   Marital status: Married    Spouse name: Sharman   Number of children: 2   Years of education: Not on file   Highest education level: High school graduate  Occupational History   Occupation: Surveyor, minerals semi-retired  Tobacco Use   Smoking status: Never    Passive exposure: Never   Smokeless tobacco: Never  Vaping Use   Vaping status: Never Used  Substance and Sexual Activity   Alcohol  use: Yes    Comment: rarely   Drug use: No   Sexual activity: Not Currently  Other Topics Concern   Not on file  Social History Narrative   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, Sharman   Self-employed painter   Social Drivers of Health   Financial Resource Strain: Low Risk  (05/16/2024)   Overall Financial Resource Strain  (CARDIA)    Difficulty of Paying Living Expenses: Not hard at all  Food Insecurity: No Food Insecurity (05/14/2024)   Hunger Vital Sign    Worried About Running Out of Food in the Last Year: Never true    Ran Out of Food in the Last Year: Never true  Transportation Needs: No Transportation Needs (05/14/2024)   PRAPARE - Administrator, Civil Service (Medical): No    Lack of Transportation (Non-Medical): No  Physical Activity: Sufficiently Active (02/06/2023)   Exercise Vital Sign    Days of Exercise per Week: 7 days    Minutes of Exercise per Session: 30 min  Stress: No Stress Concern Present (02/06/2023)   [employer] of Occupational Health - Occupational Stress Questionnaire    Feeling of Stress : Not at all  Social Connections: Socially Integrated (05/14/2024)   Social Connection and Isolation Panel    Frequency of Communication with Friends and Family: More than three times a week    Frequency of Social Gatherings with Friends and Family: More than three times a week    Attends Religious Services: More than 4 times per year    Active Member of [employer] or Organizations: Yes    Attends Banker Meetings: Never    Marital Status: Married  Catering manager Violence: Not At Risk (05/14/2024)   Humiliation, Afraid, Rape, and Kick questionnaire  Fear of Current or Ex-Partner: No    Emotionally Abused: No    Physically Abused: No    Sexually Abused: No    Family History:   Family History  Problem Relation Age of Onset   Breast cancer Mother    Heart attack Father    Esophageal cancer Maternal Grandfather    Anesthesia problems Neg Hx    Hypotension Neg Hx    Malignant hyperthermia Neg Hx    Pseudochol deficiency Neg Hx    Colon cancer Neg Hx    Colon polyps Neg Hx    Stomach cancer Neg Hx    Rectal cancer Neg Hx      ROS:  Please see the history of present illness.  All other ROS reviewed and negative.     Physical Exam/Data:   Vitals:    05/16/24 0600 05/16/24 0854 05/16/24 0859 05/16/24 1223  BP:  123/72 123/72 122/78  Pulse:   76 79  Resp:    16  Temp:    98.1 F (36.7 C)  TempSrc:    Oral  SpO2:  97%  90%  Weight: 95.6 kg     Height:        Intake/Output Summary (Last 24 hours) at 05/16/2024 1235 Last data filed at 05/16/2024 1226 Gross per 24 hour  Intake 1247.36 ml  Output 5600 ml  Net -4352.64 ml      05/16/2024    6:00 AM 05/15/2024    6:21 AM 05/13/2024   10:29 PM  Last 3 Weights  Weight (lbs) 210 lb 12.2 oz 218 lb 1.6 oz 224 lb 8 oz  Weight (kg) 95.6 kg 98.93 kg 101.833 kg     Body mass index is 29.4 kg/m.  General:  Well nourished, well developed, in no acute distress HEENT: normal Lymph: no adenopathy Neck: no JVD Cardiac:  normal S1, S2; RRR;  3/6 SEM at RUSB. 2/6 holosytolic murmur at apex.  Lungs:  clear to auscultation bilaterally, no wheezing, rhonchi or rales  Abd: soft, nontender, no hepatomegaly  Ext: no edema Musculoskeletal:  No deformities, BUE and BLE strength normal and equal Skin: warm and dry  Neuro:  CNs 2-12 intact, no focal abnormalities noted Psych:  Normal affect   EKG:  The EKG was personally reviewed and demonstrates:  sinus, anterior q waves, HR 71 Telemetry:  Telemetry was personally reviewed and demonstrates:  sinus with short run of SVT  Cardiac Studies & Procedures   ______________________________________________________________________________________________ CARDIAC CATHETERIZATION  CARDIAC CATHETERIZATION 05/15/2024  Conclusion Images from the original result were not included. Cardiac Catheterization 05/15/24: Hemodynamic data: RA 23/22, mean 18 mmHg. RV 63/3, EDP 23 mmHg. PA 68/30, mean 43 mmHg. PW 33/36, mean 31 mmHg. PA saturation 53%, AO saturation 92%. QP/QS 1.00.  CO 3.43, CI 1.57, markedly reduced.  PAPi 2.1.  Angiographic data: Left main nonexistent and has separate ostia for the LAD and CX immediately bifurcate. LAD gives origin to large D3.   The LAD is smooth and normal.  Ends at the apex. LCx: Large-caliber vessel, smooth and normal.  Gives origin to large OM 2-3.  Smooth and normal. RCA: Very large vessel, large PDA and large PL branch, smooth and normal. LIMA to D3: Widely patent.    Impression and recommendations: No coronary artery disease by cardiac catheterization.  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  PAPi preserved suggesting preserved RV function.  Significant volume excess  still persist. Increased furosemide  to 40 mg twice daily, added Jardiance  10 mg daily.  Findings Coronary Findings Diagnostic  Dominance: Right  Left Main Vessel was injected. Vessel is normal in caliber. Vessel is angiographically normal.  Left Anterior Descending Vessel was injected. Vessel is normal in caliber. Vessel is angiographically normal.  Left Circumflex Vessel was injected. Vessel is normal in caliber. Vessel is angiographically normal.  Right Coronary Artery Vessel was injected. Vessel is normal in caliber. Vessel is angiographically normal.  LIMA Graft To 3rd Diag  Intervention  No interventions have been documented.     ECHOCARDIOGRAM  ECHOCARDIOGRAM COMPLETE 05/14/2024  Narrative ECHOCARDIOGRAM REPORT    Patient Name:   Kirk [name] Date of Exam: 05/14/2024 Medical Rec #:  [id number]      Height:       71.0 in Accession #:    [id number]     Weight:       224.5 lb Date of Birth:  Apr 16, 1946      BSA:          2.215 m Patient Age:    78 years       BP:           120/52 mmHg Patient Gender: M              HR:           81 bpm. Exam Location:  Inpatient  Procedure: 2D Echo, Cardiac Doppler and Color Doppler (Both Spectral and Color Flow Doppler were utilized during procedure).  Indications:    I50.40* Unspecified combined systolic (congestive) and diastolic (congestive) heart failure  History:        Patient has  prior history of Echocardiogram examinations, most recent 10/01/2023. CAD, Prior CABG and Abnormal ECG, Aortic Valve Disease, Arrythmias:Atrial Fibrillation, Signs/Symptoms:Dyspnea and Shortness of Breath; Risk Factors:Hypertension and Diabetes. Pulmonary embolus. Aortic Valve: 25 mm Magna valve is present in the aortic position. Procedure Date: 2012.  Sonographer:    Ellouise Mose RDCS Referring Phys: [phone] REDIA SAILOR [facility]   Sonographer Comments: Technically difficult study due to poor echo windows. Image acquisition challenging due to patient body habitus. Very difficult to obtain AV gradient. Study interrrupted for PA consult for at least 17 minutes. IMPRESSIONS   1. Left ventricular ejection fraction, by estimation, is 70 to 75%. Left ventricular ejection fraction by 2D MOD biplane is 71.3 %. The left ventricle has hyperdynamic function. The left ventricle has no regional wall motion abnormalities. There is severe asymmetric left ventricular hypertrophy of the basal-septal segment. Left ventricular diastolic function could not be evaluated. 2. Right ventricular systolic function is low normal. The right ventricular size is normal. There is normal pulmonary artery systolic pressure. The estimated right ventricular systolic pressure is 24.9 mmHg. 3. Left atrial size was moderately dilated. 4. The mitral valve is degenerative. Trivial mitral valve regurgitation. Moderate mitral annular calcification. 5. The aortic valve has been repaired/replaced. Aortic valve regurgitation is trivial. Moderate aortic valve stenosis. There is a 25 mm Magna valve present in the aortic position. Procedure Date: 2012. Echo findings are consistent with normal structure and function of the aortic valve prosthesis. Aortic valve area, by VTI measures 1.36 cm. Aortic valve mean gradient measures 14.0 mmHg. Aortic valve Vmax measures 2.61 m/s. 6. Aortic dilatation noted. There is borderline dilatation of the ascending  aorta, measuring 38 mm.  Comparison(s): Changes from prior study are noted. 10/01/2023: LVEF 60-65%, bioprosthetic AVR - MG 21 mmHg.  FINDINGS Left Ventricle: Left ventricular  ejection fraction, by estimation, is 70 to 75%. Left ventricular ejection fraction by 2D MOD biplane is 71.3 %. The left ventricle has hyperdynamic function. The left ventricle has no regional wall motion abnormalities. The left ventricular internal cavity size was normal in size. There is severe asymmetric left ventricular hypertrophy of the basal-septal segment. Left ventricular diastolic function could not be evaluated due to atrial fibrillation. Left ventricular diastolic function could not be evaluated.  Right Ventricle: The right ventricular size is normal. No increase in right ventricular wall thickness. Right ventricular systolic function is low normal. There is normal pulmonary artery systolic pressure. The tricuspid regurgitant velocity is 2.34 m/s, and with an assumed right atrial pressure of 3 mmHg, the estimated right ventricular systolic pressure is 24.9 mmHg.  Left Atrium: Left atrial size was moderately dilated.  Right Atrium: Right atrial size was normal in size.  Pericardium: There is no evidence of pericardial effusion.  Mitral Valve: The mitral valve is degenerative in appearance. Moderate mitral annular calcification. Trivial mitral valve regurgitation. MV peak gradient, 15.3 mmHg. The mean mitral valve gradient is 7.0 mmHg.  Tricuspid Valve: The tricuspid valve is grossly normal. Tricuspid valve regurgitation is trivial.  Aortic Valve: The aortic valve has been repaired/replaced. Aortic valve regurgitation is trivial. Moderate aortic stenosis is present. Aortic valve mean gradient measures 14.0 mmHg. Aortic valve peak gradient measures 27.2 mmHg. Aortic valve area, by VTI measures 1.36 cm. There is a 25 mm Magna valve present in the aortic position. Procedure Date: 2012. Echo findings are consistent  with normal structure and function of the aortic valve prosthesis.  Pulmonic Valve: The pulmonic valve was normal in structure. Pulmonic valve regurgitation is not visualized.  Aorta: Aortic dilatation noted. There is borderline dilatation of the ascending aorta, measuring 38 mm.  IAS/Shunts: No atrial level shunt detected by color flow Doppler.   LEFT VENTRICLE PLAX 2D                        Biplane EF (MOD) LVIDd:         4.49 cm         LV Biplane EF:   Left LVIDs:         3.20 cm                          ventricular LV PW:         1.34 cm                          ejection LV IVS:        1.70 cm                          fraction by LVOT diam:     2.38 cm                          2D MOD LV SV:         73                               biplane is LV SV Index:   33  71.3 %. LVOT Area:     4.45 cm Diastology LV e' medial:    5.66 cm/s LV Volumes (MOD)               LV E/e' medial:  33.4 LV vol d, MOD    75.2 ml       LV e' lateral:   8.27 cm/s A2C:                           LV E/e' lateral: 22.9 LV vol d, MOD    83.1 ml A4C: LV vol s, MOD    19.0 ml A2C: LV vol s, MOD    27.2 ml A4C: LV SV MOD A2C:   56.2 ml LV SV MOD A4C:   83.1 ml LV SV MOD BP:    57.2 ml  RIGHT VENTRICLE            IVC RV S prime:     6.85 cm/s  IVC diam: 1.97 cm TAPSE (M-mode): 1.7 cm  LEFT ATRIUM             Index        RIGHT ATRIUM           Index LA diam:        4.42 cm 2.00 cm/m   RA Area:     16.50 cm LA Vol (A2C):   62.1 ml 28.04 ml/m  RA Volume:   42.00 ml  18.96 ml/m LA Vol (A4C):   89.7 ml 40.50 ml/m LA Biplane Vol: 78.3 ml 35.35 ml/m AORTIC VALVE AV Area (Vmax):    1.37 cm AV Area (Vmean):   1.43 cm AV Area (VTI):     1.36 cm AV Vmax:           260.67 cm/s AV Vmean:          172.000 cm/s AV VTI:            0.541 m AV Peak Grad:      27.2 mmHg AV Mean Grad:      14.0 mmHg LVOT Vmax:         80.50 cm/s LVOT Vmean:        55.300 cm/s LVOT VTI:           0.165 m LVOT/AV VTI ratio: 0.30  AORTA Ao Root diam: 3.13 cm Ao Asc diam:  3.72 cm  MITRAL VALVE                TRICUSPID VALVE MV Area (PHT): 3.99 cm     TR Peak grad:   21.9 mmHg MV Area VTI:   1.56 cm     TR Vmax:        234.00 cm/s MV Peak grad:  15.3 mmHg MV Mean grad:  7.0 mmHg     SHUNTS MV Vmax:       1.96 m/s     Systemic VTI:  0.16 m MV Vmean:      109.9 cm/s   Systemic Diam: 2.38 cm MV Decel Time: 190 msec MV E velocity: 189.00 cm/s MV A velocity: 63.50 cm/s MV E/A ratio:  2.98  Vinie Maxcy MD Electronically signed by Vinie Maxcy MD Signature Date/Time: 05/14/2024/4:38:49 PM    Final   TEE  ECHO TEE 05/15/2024  Narrative TRANSESOPHOGEAL ECHO REPORT    Patient Name:   [name] Date of Exam: 05/15/2024 Medical Rec #:  [id number]  Height:       71.0 in Accession #:    [id number]     Weight:       218.1 lb Date of Birth:  19-Oct-1945      BSA:          2.188 m Patient Age:    78 years       BP:           132/76 mmHg Patient Gender: M              HR:           69 bpm. Exam Location:  Inpatient  Procedure: Transesophageal Echo, 3D Echo, Cardiac Doppler and Color Doppler (Both Spectral and Color Flow Doppler were utilized during procedure).  Indications:     Mitral Stenosis i34.1  History:         Patient has prior history of Echocardiogram examinations, most recent 05/14/2024. CHF, Prior CABG; Risk Factors:Hypertension and Diabetes. Aortic Valve: Magna bioprosthetic valve is present in the aortic position. Procedure Date: 08/16/2011.  Sonographer:     Damien Senior RDCS Referring Phys:  [id number] LEONTINE SAILOR LOCKWOOD Diagnosing Phys: Stanly Leavens MD  PROCEDURE: After discussion of the risks and benefits of a TEE, an informed consent was obtained from the patient. TEE procedure time was 35 minutes. The transesophogeal probe was passed without difficulty through the esophogus of the patient. Imaged were obtained with the patient in a  left lateral decubitus position. Sedation performed by different physician. The patient was monitored while under deep sedation. Anesthestetic sedation was provided intravenously by Anesthesiology: 396mg  of Propofol , 100mg  of Lidocaine . Supplementary images were obtained from transthoracic windows as indicated to answer the clinical question. The patient developed no complications during the procedure.  IMPRESSIONS   1. Left ventricular ejection fraction, by estimation, is 60 to 65%. The left ventricle has normal function. 2. Right ventricular systolic function is normal. The right ventricular size is normal. 3. No left atrial/left atrial appendage thrombus was detected. 4. The mitral valve is degenerative. Moderate to severe mitral valve regurgitation. Mild mitral stenosis. The mean mitral valve gradient is 3.0 mmHg with average heart rate of 60 bpm. Severe mitral annular calcification. 5. The aortic valve has been replaced by a 25 mm Magna Ease. Aortic valve regurgitation is not visualized. Severe aortic valve stenosis. 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 Procedure Date: 08/16/2011. 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 Aortic valve area, by VTI measures 0.69 cm. Aortic valve mean gradient measures 53.5 mmHg. Aortic valve Vmax measures 4.79 m/s. Acceleration time 119 msec. Consider evaluation for prosthetic valve stenosis. 6. 3D performed of the mitral valve and demonstrates 3D assessment of Mitral Valve: There are three jets of mitral regurgiation at A1P1, A2P2, and A3,P3. Summation of 3D VCA 0.562 (severe) but with only systolic blunting of the pulmonary vein flow. Color flow more prominent at 123/78 mm Hg. 3D MV Area 3.58 cm2. The anterior annular calcification about A2 is protruding and would make mTEER challening, along with baseline MVA. 7. The inferior vena cava is dilated in size with >50% respiratory variability, suggesting  right atrial pressure of 8 mmHg.  FINDINGS Left Ventricle: Left ventricular ejection fraction, by estimation, is 60 to 65%. The left ventricle has normal function. The left ventricular internal cavity size was normal in size.  Right Ventricle: The right ventricular size is normal. No increase in right ventricular wall thickness. Right ventricular systolic function is  normal.  Left Atrium: Left atrial size was normal in size. No left atrial/left atrial appendage thrombus was detected.  Right Atrium: Right atrial size was normal in size.  Pericardium: There is no evidence of pericardial effusion.  Mitral Valve: The mitral valve is degenerative in appearance. Severe mitral annular calcification. Moderate to severe mitral valve regurgitation. Mild mitral valve stenosis. MV peak gradient, 7.1 mmHg. The mean mitral valve gradient is 3.0 mmHg with average heart rate of 60 bpm.  Tricuspid Valve: The tricuspid valve is normal in structure. Tricuspid valve regurgitation is trivial.  Aortic Valve: The aortic valve has been repaired/replaced. Aortic valve regurgitation is not visualized. Severe aortic stenosis is present. Aortic valve mean gradient measures 53.5 mmHg. Aortic valve peak gradient measures 91.8 mmHg. Aortic valve area, by VTI measures 0.69 cm. 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 Procedure Date: 08/16/2011.  Pulmonic Valve: The pulmonic valve was grossly normal. Pulmonic valve regurgitation is not visualized.  Aorta: The aortic root and ascending aorta are structurally normal, with no evidence of dilitation.  Venous: A systolic blunting flow pattern is recorded from the left upper pulmonary vein. The inferior vena cava is dilated in size with greater than 50% respiratory variability, suggesting right atrial pressure of 8 mmHg.  IAS/Shunts: The interatrial septum appears to be lipomatous. No atrial level shunt detected by color flow Doppler.  Additional  Comments: 3D was performed not requiring image post processing on an independent workstation and was normal. Spectral Doppler performed.  LEFT VENTRICLE PLAX 2D LVOT diam:     2.30 cm   Diastology LV SV:         79        LV e' medial:    6.28 cm/s LV SV Index:   36        LV E/e' medial:  22.8 LVOT Area:     4.15 cm  LV e' lateral:   6.07 cm/s LV E/e' lateral: 23.6   AORTIC VALVE AV Area (Vmax):    0.68 cm AV Area (Vmean):   0.75 cm AV Area (VTI):     0.69 cm AV Vmax:           479.00 cm/s AV Vmean:          342.500 cm/s AV VTI:            1.135 m AV Peak Grad:      91.8 mmHg AV Mean Grad:      53.5 mmHg LVOT Vmax:         78.20 cm/s LVOT Vmean:        61.500 cm/s LVOT VTI:          0.189 m LVOT/AV VTI ratio: 0.17  MITRAL VALVE MV Area (PHT): 2.55 cm     SHUNTS MV Area VTI:   2.16 cm     Systemic VTI:  0.19 m MV Peak grad:  7.1 mmHg     Systemic Diam: 2.30 cm MV Mean grad:  3.0 mmHg MV Vmax:       1.33 m/s MV Vmean:      74.2 cm/s MV Decel Time: 297 msec MV E velocity: 143.00 cm/s MV A velocity: 61.10 cm/s MV E/A ratio:  2.34  Stanly Leavens MD Electronically signed by Stanly Leavens MD Signature Date/Time: 05/15/2024/6:03:38 PM    Final        ______________________________________________________________________________________________      Laboratory Data:  High Sensitivity Troponin:  No results for input(s):  TROPONINIHS in the last 720 hours.   Chemistry Recent Labs  Lab 05/14/24 0322 05/15/24 0640 05/15/24 1621 05/15/24 1626 05/16/24 0313  NA 140 138 137  137 137 136  K 5.2* 4.0 4.5  4.5 4.5 4.2  CL 103 99  --   --  98  CO2 22 27  --   --  24  GLUCOSE 153* 96  --   --  95  BUN 25* 31*  --   --  31*  CREATININE 1.43* 1.18  --   --  1.20  CALCIUM  8.7* 8.8*  --   --  8.9  GFRNONAA 51* >60  --   --  >60  ANIONGAP 15 12  --   --  14    Recent Labs  Lab 05/14/24 0322 05/15/24 0640 05/16/24 0313  PROT 7.0  --   --    ALBUMIN  3.7 3.7 3.6  AST 35  --   --   ALT 25  --   --   ALKPHOS 61  --   --   BILITOT 1.2  --   --    Hematology Recent Labs  Lab 05/14/24 0754 05/15/24 0640 05/15/24 1621 05/15/24 1626 05/16/24 0313  WBC 19.5* 17.2*  --   --  12.1*  RBC 4.82 5.34  --   --  5.34  HGB 14.4 16.0 16.3  16.3 16.3 16.1  HCT 46.3 50.1 48.0  48.0 48.0 50.6  MCV 96.1 93.8  --   --  94.8  MCH 29.9 30.0  --   --  30.1  MCHC 31.1 31.9  --   --  31.8  RDW 16.0* 16.2*  --   --  15.7*  PLT 163 196  --   --  155   BNP Recent Labs  Lab 05/10/24 0916 05/13/24 1425 05/14/24 1810  BNP  --   --  907.6*  PROBNP 1,252.0* 2,462.0*  --     DDimer No results for input(s): DDIMER in the last 168 hours.   Radiology/Studies:     STS Risk Calculator:  Procedure Type: Isolated AVR Perioperative Outcome Estimate % Operative Mortality 7.57% Morbidity & Mortality 34.7% Stroke 7.85% Renal Failure 9.14% Reoperation 10.8% Prolonged Ventilation 30.1% Deep Sternal Wound Infection 0.164% Long Hospital Stay (>14 days) 21.8% Short Hospital Stay (<6 days)* 11.5%   __________________________  [place] Cardiomyopathy Questionnaire     05/16/2024    2:39 PM  KCCQ-12  1 a. Ability to shower/bathe Extremely limited  1 b. Ability to walk 1 block Other, Did not do  1 c. Ability to hurry/jog Other, Did not do  2. Edema feet/ankles/legs Less than once a week  3. Limited by fatigue All of the time  4. Limited by dyspnea All of the time  5. Sitting up / on 3+ pillows Every night  6. Limited enjoyment of life Extremely limited  7. Rest of life w/ symptoms Not at all satisfied  8 a. Participation in hobbies Severely limited  8 b. Participation in chores Severely limited  8 c. Visiting family/friends Severely limited      Assessment and Plan:   Kirk [name] is a 78 y.o. male with symptoms of severe, stage D1 aortic stenosis with NYHA Class IV symptoms currently admitted for acute CHF.  TTE 05/14/24  showed EF 70-75%, severe asymmetric left ventricular hypertrophy of the basal-septal segment, low normal RV function, RSVP 24.9 mmhg, mod MAC, mod bioprosthetic AS with mean gradient 14 mm hg,  AVA 1.36 cm2.   TEE 05/15/24 showed EF 60-65%, severe MAC with mod- severe MR (3 jets at A1P1, A2P2, and A3,P3. Summation of 3D VCA 0.562 (severe) but with only systolic blunting of the pulmonary vein flow. and mild MS (not good mTEER candidate due to protruding calcium  at a2 and small AVA), and severe bioprosthetic AS with mean gradient of 53.5 mm hg, Vmax 4.79 m/s, and AVA 0.69 cm2.   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  PAPi preserved suggesting preserved RV function.     I have reviewed the natural history of aortic stenosis with the patient. We have discussed the limitations of medical therapy and the poor prognosis associated with symptomatic aortic stenosis. We have reviewed potential treatment options, including palliative medical therapy, conventional surgical aortic valve replacement, and transcatheter aortic valve replacement. We discussed treatment options in the context of this patient's specific comorbid medical conditions.    The patient's predicted risk of mortality with conventional aortic valve replacement is 7.57% primarily based on age, previous sternotomy with AVR/CABG, acute CHF, obesity, HTN, DMT2. Other significant comorbid conditions include severe pulmonary HTN. TAVR seems like a reasonable treatment option for this patient pending formal cardiac surgical consultation. We discussed typical evaluation which will require a gated cardiac CTA and a CTA of the chest/abdomen/pelvis to evaluate both his cardiac anatomy and peripheral vasculature.   CT scans are being completed today. We will review and if he has anatomy amendable for TAVR, we  can potentially do his case next Tuesday and he will need to remain inpatient.   Weekend team has been alerted to plan.    Signed, Lamarr Hummer, PA-C  05/16/2024 12:36 PM    ATTENDING ATTESTATION:  After conducting a review of all available clinical information with the care team, interviewing the patient, and performing a physical exam, I agree with the findings and plan described in this note.   GEN: No acute distress, AO x 3 HEENT:  MMM, no JVD, no scleral icterus Cardiac: RRR, 3 out of 6 crescendo decrescendo murmur Respiratory: Clear to auscultation bilaterally. GI: Soft, nontender, non-distended  MS: No edema; No deformity. 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  He was medically optimized.  He now feels much better.  A TEE demonstrated severe bioprosthetic aortic valve degeneration with a mean gradient of greater than 50 mmHg.  She also has moderate to severe mitral degenerative regurgitation.  I reviewed the patient's TEE.  He has multiple separate jets that in summation likely represent severe mitral regurgitation.  His bioprosthetic aortic valve demonstrates severe structural degeneration.  He had a TAVR protocol CTA which demonstrates a concerning VTC and a very effaced left coronary sinus.  Given this anatomy he will need leaflet modification prior to TAVR since his left circumflex system is not grafted.  The VTC of the right coronary artery looks more reasonable.  For now we will defer TAVR coming this Tuesday.  I think the patient can be discharged home and follow-up with me as an outpatient.  I have  already reached out to my contact at [facility] clinic about potential leaflet  modification.  Discussed with patient and wife by phone at length.  Patient can be discharged home and we will arrange structural heart disease follow-up with me.  Lurena Red, MD Pager [phone]

## 2024-05-16 NOTE — Progress Notes (Signed)
 PHARMACY - ANTICOAGULATION CONSULT NOTE  Pharmacy Consult for heparin   Indication: history of PE/DVT  Allergies  Allergen Reactions   Cat Dander Shortness Of Breath and Swelling   Hydrocodone  Other (See Comments)    Seizures    Atorvastatin  Other (See Comments)    Joint pain   Dust Mite Extract Other (See Comments) and Swelling   Metformin Other (See Comments)    Diarrhea, muscle cramps   Oxycodone  Other (See Comments)    Seizure PATIENT DOES NOT WANT ANY NARCOTICS   Semaglutide (0.25 Or 0.5mg -Dos) Nausea Only    AT HIGHER DOSE    Patient Measurements: Height: 5' 11 (180.3 cm) Weight: 95.6 kg (210 lb 12.2 oz) IBW/kg (Calculated) : 75.3 HEPARIN  DW (KG): 96.4  Vital Signs: Temp: 97.7 F (36.5 C) (09/11 2324) Temp Source: Oral (09/11 2324) BP: 123/72 (09/12 0859) Pulse Rate: 76 (09/12 0859)  Labs: Recent Labs    05/14/24 0322 05/14/24 0754 05/15/24 0640 05/15/24 1358 05/15/24 1621 05/15/24 1626 05/16/24 0313  HGB  --  14.4 16.0  --  16.3  16.3 16.3 16.1  HCT  --  46.3 50.1  --  48.0  48.0 48.0 50.6  PLT  --  163 196  --   --   --  155  APTT  --   --  84* 83*  --   --   --   HEPARINUNFRC  --   --  1.06*  --   --   --   --   CREATININE 1.43*  --  1.18  --   --   --  1.20    Estimated Creatinine Clearance: 61.8 mL/min (by C-G formula based on SCr of 1.2 mg/dL).   Medical History: Past Medical History:  Diagnosis Date   Allergy    Anal cancer (HCC) 04/2012   Aortic stenosis    valve replacement   Arthritis    1995- cerv. 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   CHF (congestive heart failure) (HCC)    Coronary artery disease    aortic stenosis, CAD   Diabetes mellitus    Type 2   DVT (deep venous thrombosis) (HCC)    ED (erectile dysfunction)    Elevated cholesterol    Heart murmur    no  problems   History of kidney stones    passed, lithrotrispy and surgery   History of radiation therapy 05/20/12-06/28/12   anal cancer=54gy total dose   Hypertension    Hypothyroidism    Kidney stones    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   Malignant neoplasm of other sites of rectum, rectosigmoid junction, and anus 04/12/2012   Myocardial infarction (HCC) 2012   Personal history of colonic polyps 03/20/2012   Pneumonia    Pulmonary embolism (HCC)    Rectal bleeding    Rectal mass 03/20/2012   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   #70mm [facility] Ease pericardial tissue valve    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   05/13/2024 Morning   benzonatate  (TESSALON ) 100 MG capsule Take 1 capsule (100 mg total) by mouth every 8 (eight) hours. 21 capsule 0 05/13/2024 Morning   DULoxetine  (CYMBALTA ) 60 MG capsule Take 1 capsule by mouth at bedtime 90 capsule 0 05/12/2024 Bedtime   FARXIGA  5 MG TABS tablet Take 1 tablet by mouth once daily 90 tablet 0 05/13/2024 Morning   levothyroxine  (SYNTHROID ) 50 MCG tablet Take 1 tablet (50 mcg total) by mouth daily before breakfast. 90 tablet 1 05/13/2024 Morning   losartan  (COZAAR ) 50 MG tablet Take 1 tablet by mouth once daily 90 tablet 0 05/13/2024 Morning   metoprolol  tartrate (LOPRESSOR ) 25 MG tablet Take 1 tablet by mouth twice daily 180 tablet 0 05/13/2024 Morning   MYRBETRIQ  25 MG TB24 tablet Take 1 tablet (25 mg total) by mouth daily. 90 tablet 3 05/13/2024 Morning   OZEMPIC , 0.25 OR 0.5 MG/DOSE,  2 MG/3ML SOPN INJECT 0.25MG   SUBCUTANEOUSLY ONCE A WEEK 3 mL 0 Past Week   predniSONE  (DELTASONE ) 20 MG tablet 3 tabs po day one, then 2 tabs daily x 4 days 11 tablet 0 05/13/2024 Morning   rosuvastatin  (CRESTOR ) 40 MG tablet TAKE 1 TABLET BY MOUTH AT BEDTIME 90 tablet 0 05/12/2024 Bedtime   tadalafil  (CIALIS ) 20 MG tablet Take 20 mg by mouth daily as needed for erectile dysfunction.   Taking As Needed   triamcinolone  cream (KENALOG ) 0.1 % APPLY CREAM EXTERNALLY TO AFFECTED AREA TWICE DAILY AS NEEDED FOR ITCHING   Taking   Vitamins-Lipotropics (LIPO-FLAVONOID PLUS) TABS Take 1 tablet by mouth daily.   05/12/2024   zinc sulfate, 50mg  elemental zinc, 220 (50 Zn) MG capsule Take 220 mg by mouth daily.   05/12/2024 Evening   HYDROmorphone  (DILAUDID ) 2 MG tablet Take 0.5-1 tablets (1-2 mg total) by mouth every 8 (eight) hours as needed for severe pain (pain score 7-10). (Patient not taking: Reported on 05/13/2024) 21 tablet 0 Not Taking   methocarbamol  (ROBAXIN ) 500 MG tablet Take 1 tablet (500 mg total) by mouth every 6 (six) hours as needed for muscle spasms. (Patient not taking: Reported on 05/13/2024) 40 tablet 0 Not Taking   NEEDLE, DISP, 22 G 22G X 1-1/2 MISC 1 each by Does not apply route once a week. Use to administer medication. 50 each 1    ondansetron  (ZOFRAN ) 4 MG tablet Take 1 tablet (4 mg total) by mouth every 6 (six) hours as needed for nausea. (Patient not taking: Reported on 05/13/2024) 20 tablet 0 Not Taking   traMADol  (ULTRAM ) 50 MG tablet Take 1-2 tablets (50-100 mg total) by mouth every 6 (six) hours as needed for moderate pain (pain score 4-6). (Patient not taking: Reported on 05/13/2024) 40 tablet 0 Not Taking   Scheduled:   aspirin  EC  81 mg Oral Daily   empagliflozin   10 mg Oral Daily   furosemide   40 mg Intravenous BID   insulin  aspart  0-9 Units Subcutaneous TID WC   levothyroxine   50 mcg Oral QAC breakfast   metoprolol  tartrate  25 mg Oral BID   mirabegron  ER  25 mg Oral Daily    rosuvastatin   40 mg Oral QHS    Assessment: 78 yo male here with HF. He is on apixaban  PTA  for history of PE/DVT in 2023 (and history of squamous cell carcinoma).   He is now s/p cath with no CAD and RHC reveals significant volume  Plan for CVTS to eval valve before transitioning back to apixaban . Pt got one dose this AM so we will resume heparin  tonight.  Goal of Therapy:  Heparin  level 0.3-0.7 units/ml aPTT 66-102 seconds Monitor platelets by anticoagulation protocol: Yes   Plan:  -Resume IV heparin  1550 units/hr at 2100 -aPTT and heparin  level in 8 hrs  Sergio Batch, PharmD, [place], AAHIVP, CPP Infectious Disease Pharmacist 05/16/2024 11:09 AM

## 2024-05-16 NOTE — Telephone Encounter (Signed)
 Pt's wife calling on behalf of pt. Pt is in hospital and care team in hospital is discussing doing another aortic valve replacement, but pt would like Dr. Jeffrie advice as he has been his provider for many years. Please advise.

## 2024-05-16 NOTE — Progress Notes (Incomplete)
 Heart Failure Nurse Navigator Progress Note  PCP: [name], [name], MD PCP-Cardiologist: *** Admission Diagnosis: *** Admitted from: ***  Presentation:   Kirk [name] presented with ***  ECHO/ LVEF: ***  Clinical Course:  Past Medical History:  Diagnosis Date   Allergy    Anal cancer (HCC) 04/2012   Aortic stenosis    valve replacement   Arthritis    1995- cerv. 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   CHF (congestive heart failure) (HCC)    Coronary artery disease    aortic stenosis, CAD   Diabetes mellitus    Type 2   DVT (deep venous thrombosis) (HCC)    ED (erectile dysfunction)    Elevated cholesterol    Heart murmur    no problems   History of kidney stones    passed, lithrotrispy and surgery   History of radiation therapy 05/20/12-06/28/12   anal cancer=54gy total dose   Hypertension    Hypothyroidism    Kidney stones    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   Malignant neoplasm of other sites of rectum, rectosigmoid junction, and anus 04/12/2012   Myocardial infarction [facility]) 2012   Personal history of colonic polyps 03/20/2012   Pneumonia    Pulmonary embolism (HCC)    Rectal bleeding    Rectal mass 03/20/2012   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   #87mm [facility] Ease pericardial tissue valve    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   LIMA to diagonal branch    Seizure disorder, grand mal (HCC)    only 1   Seizures (HCC)    after taking Oxycodone ; not prescribed seizure med   Shortness of breath    Thrombosed hemorrhoids      Social History   Socioeconomic History   Marital status: Married    Spouse name: Sharman   Number of children: 2   Years of education: Not on file   Highest education level: Not on file  Occupational History   Occupation: Surveyor, minerals semi-retired   Tobacco Use   Smoking status: Never    Passive exposure: Never   Smokeless tobacco: Never  Vaping Use   Vaping status: Never Used  Substance and Sexual Activity   Alcohol  use: Yes    Comment: rarely   Drug use: No   Sexual activity: Not Currently  Other Topics Concern   Not on file  Social History Narrative   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, Sharman   Self-employed painter   Social Drivers of Corporate investment banker Strain: Low Risk  (02/06/2023)   Overall Financial Resource Strain (CARDIA)    Difficulty of Paying Living Expenses: Not hard at all  Food Insecurity: No Food Insecurity (05/14/2024)   Hunger Vital Sign    Worried About Running Out of Food in the Last Year: Never true    Ran Out of Food in the Last Year: Never true  Transportation Needs: No Transportation Needs (05/14/2024)   PRAPARE - Administrator, Civil Service (Medical): No    Lack of Transportation (Non-Medical): No  Physical Activity: Sufficiently Active (02/06/2023)   Exercise Vital Sign    Days of Exercise per Week: 7 days    Minutes of Exercise per Session: 30 min  Stress: No Stress Concern Present (02/06/2023)  [employer] of Occupational Health - Occupational Stress Questionnaire    Feeling of Stress : Not at all  Social Connections: Socially Integrated (05/14/2024)   Social Connection and Isolation Panel    Frequency of Communication with Friends and Family: More than three times a week    Frequency of Social Gatherings with Friends and Family: More than three times a week    Attends Religious Services: More than 4 times per year    Active Member of [employer] or Organizations: Yes    Attends Banker Meetings: Never    Marital Status: Married   Water engineer and Provision:  Detailed education and instructions provided on heart failure disease management including the following:  Signs and symptoms of Heart Failure When to call the physician Importance of daily weights Low sodium  diet Fluid restriction Medication management Anticipated future follow-up appointments  Patient education given on each of the above topics.  Patient acknowledges understanding via teach back method and acceptance of all instructions.  Education Materials:  Living Better With Heart Failure Booklet, HF zone tool, & Daily Weight Tracker Tool.  Patient has scale at home: *** Patient has pill box at home: ***    High Risk Criteria for Readmission and/or Poor Patient Outcomes: Heart failure hospital admissions (last 6 months): ***  No Show rate: *** Difficult social situation: *** Demonstrates medication adherence: *** Primary Language: *** Literacy level: ***  Barriers of Care:   ***  Considerations/Referrals:   Referral made to Heart Failure Pharmacist Stewardship: *** Referral made to Heart Failure CSW/NCM TOC: *** Referral made to Heart & Vascular TOC clinic: ***  Items for Follow-up on DC/TOC: ***   ***

## 2024-05-16 NOTE — Progress Notes (Signed)
 [facility]                                                                              Kirk [name], is a 78 y.o. 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  Patient had come to the ER about 3 days ago with shortness of breath with cough and wheezing.  At that time patient was diagnosed with possible pneumonia and bronchitis was discharged home on antibiotics and prednisone .  Due to worsening shortness of breath patient presents back to the ER.  Denied any chest pain or abdominal pain.   ED Course: In the ER patient was acutely short of breath and had to be placed on BiPAP.  Chest x-ray shows features concerning for fluid overload was given Lasix  40 mg IV.  Troponin remained flat.  On exam patient was diffusely wheezing, was placed on nebulizer therapy.  COVID flu test were negative respiratory panel was  negative 2 days ago.   proBNP was 2400 troponins were 57 and 52.  WBC count was 16.1.  Assessment & Plan    Principal Problem:   Acute hypoxemic respiratory failure (HCC) Acute on chronic diastolic CHF - Initially placed on BiPAP in ED, improved after Lasix  IV 40 mg x 1 and nebulizer treatments - Multifactorial likely due to acute CHF versus acute bronchitis with PNA, history of PE, BNP 2462 - 2D echo showed EF 70 to 75%, severe asymmetric LVH of the basal septal segment, left ventricular diastolic function could not be evaluated, degenerative mitral valve, trivial MR, moderate mitral annular  calcification, repaired aortic valve, trivial AR, moderate AS. 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, preserved RV function, significant volume excess, increased Lasix  to 40 mg twice daily, - Negative balance of 6.8 L, weight down from 224.5 LB's on 9/9 -> 210.7 today  Acute bronchitis/pneumonia - Chest x-ray shows no dense consolidation, favors pulmonary edema - Continue IV Lasix , IV Rocephin , doxycycline   - No acute wheezing, continue albuterol  as needed   CAD status post CABG, HTN -Currently no chest pain, troponins flat, continue eliquis , beta-blocker, statin - 2D echo showed EF 70 to 75%, severe asymmetric LVH of the basal septal segment, left ventricular diastolic function could not be evaluated, degenerative mitral valve, trivial MR, moderate mitral annular calcification, repaired aortic valve, trivial AR, moderate AS. - Cardiac catheter with no coronary artery disease  History of bioprosthetic aortic valve  replacement - 2D echo showed EF 70 to 75%,  repaired aortic valve, trivial AR, moderate AS.  History of saddle PE, on anticoagulation - Will resume Eliquis    Mild acute kidney injury - Improving  Diabetes mellitus type 2 - On Ozempic  and Farxiga  - For now continue sliding scale insulin  while inpatient  Hypothyroidism - Continue Synthroid   Diarrhea - C. difficile negative, GI pathogen panel in process   Obesity class I Estimated body mass index is 29.4 kg/m as calculated from the following:   Height as of this encounter: 5' 11 (1.803 m).   Weight as of this encounter: 95.6 kg.  Code Status: Full code DVT Prophylaxis:     Level of Care: Level of care: Progressive Family Communication: Updated patient Disposition Plan:       Remains inpatient appropriate:      Procedures:  2D echo TEE  Right and left heart cath   Consultants:   Cardiology  Antimicrobials:   Anti-infectives (From admission, onward)    Start     Dose/Rate Route Frequency Ordered Stop   05/14/24 1800  cefTRIAXone  (ROCEPHIN ) 2 g in sodium chloride  0.9 % 100 mL IVPB        2 g 200 mL/hr over 30 Minutes Intravenous Every 24 hours 05/14/24 0536     05/14/24 0630  doxycycline  (VIBRAMYCIN ) 100 mg in sodium chloride  0.9 % 250 mL IVPB        100 mg 125 mL/hr over 120 Minutes Intravenous Every 12 hours 05/14/24 0532     05/13/24 1815  cefTRIAXone  (ROCEPHIN ) 1 g in sodium chloride  0.9 % 100 mL IVPB        1 g 200 mL/hr over 30 Minutes Intravenous  Once 05/13/24 1808 05/13/24 1926   05/13/24 1815  azithromycin  (ZITHROMAX ) 500 mg in sodium chloride  0.9 % 250 mL IVPB        500 mg 250 mL/hr over 60 Minutes Intravenous  Once 05/13/24 1808 05/13/24 2049          Medications  aspirin  EC  81 mg Oral Daily   empagliflozin   10 mg Oral Daily   furosemide   40 mg Intravenous BID   insulin  aspart  0-9 Units Subcutaneous TID WC   levothyroxine   50 mcg Oral QAC breakfast   metoprolol  tartrate  25 mg Oral BID   mirabegron  ER  25 mg Oral Daily   rosuvastatin   40 mg Oral QHS      Subjective:   Kirk [name] was seen and examined today.  Diarrhea improving, no acute chest pain, shortness of breath, good output after IV Lasix .  Sitting up in the chair.  Sleepy  Objective:   Vitals:   05/15/24 2324 05/16/24 0600 05/16/24 0854 05/16/24 0859  BP: (!) 110/56  123/72 123/72  Pulse: 76   76  Resp: 16     Temp: 97.7 F (36.5 C)     TempSrc: Oral     SpO2: 90%  97%   Weight:  95.6 kg    Height:        Intake/Output Summary (Last 24 hours) at 05/16/2024 1126 Last data filed at 05/16/2024 1050 Gross per 24 hour  Intake 1297.36 ml  Output 5150 ml  Net -3852.64 ml     Wt Readings from Last 3 Encounters:  05/16/24 95.6 kg  04/07/24 99.2 kg   02/04/24 99.2 kg    Physical Exam General: Alert and oriented x 3, NAD Cardiovascular: S1 S2 clear, RRR.  Respiratory: Diminished breath  sounds at the bases, no wheezing Gastrointestinal: Soft, nontender, nondistended, NBS Ext: no pedal edema bilaterally Neuro: no new deficits Psych: Normal affect   Data Reviewed:  I have personally reviewed following labs    CBC Lab Results  Component Value Date   WBC 12.1 (H) 05/16/2024   RBC 5.34 05/16/2024   HGB 16.1 05/16/2024   HCT 50.6 05/16/2024   MCV 94.8 05/16/2024   MCH 30.1 05/16/2024   PLT 155 05/16/2024   MCHC 31.8 05/16/2024   RDW 15.7 (H) 05/16/2024   LYMPHSABS 0.8 05/14/2024   MONOABS 1.3 (H) 05/14/2024   EOSABS 0.1 05/14/2024   BASOSABS 0.0 05/14/2024     Last metabolic panel Lab Results  Component Value Date   NA 136 05/16/2024   K 4.2 05/16/2024   CL 98 05/16/2024   CO2 24 05/16/2024   BUN 31 (H) 05/16/2024   CREATININE 1.20 05/16/2024   GLUCOSE 95 05/16/2024   GFRNONAA >60 05/16/2024   GFRAA >60 01/08/2018   CALCIUM  8.9 05/16/2024   PHOS 3.8 05/16/2024   PROT 7.0 05/14/2024   ALBUMIN  3.6 05/16/2024   LABGLOB 2.8 08/21/2023   BILITOT 1.2 05/14/2024   ALKPHOS 61 05/14/2024   AST 35 05/14/2024   ALT 25 05/14/2024   ANIONGAP 14 05/16/2024    CBG (last 3)  Recent Labs    05/15/24 1720 05/15/24 2105 05/16/24 0605  GLUCAP 93 109* 100*      Coagulation Profile: No results for input(s): INR, PROTIME in the last 168 hours.   Radiology Studies: I have personally reviewed the imaging studies  ECHO TEE Result Date: 05/15/2024    TRANSESOPHOGEAL ECHO REPORT   Patient Name:   Kirk [name] Date of Exam: 05/15/2024 Medical Rec #:  [id number]      Height:       71.0 in Accession #:    [id number]     Weight:       218.1 lb Date of Birth:  1946-07-28      BSA:          2.188 m Patient Age:    78 years       BP:           132/76 mmHg Patient Gender: M              HR:           69 bpm. Exam Location:   Inpatient Procedure: Transesophageal Echo, 3D Echo, Cardiac Doppler and Color Doppler            (Both Spectral and Color Flow Doppler were utilized during            procedure). Indications:     Mitral Stenosis i34.1  History:         Patient has prior history of Echocardiogram examinations, most                  recent 05/14/2024. CHF, Prior CABG; Risk Factors:Hypertension                  and Diabetes.                  Aortic Valve: Magna bioprosthetic valve is present in the                  aortic position. Procedure Date: 08/16/2011.  Sonographer:     Damien Senior RDCS Referring Phys:  [id number] LEONTINE SAILOR LOCKWOOD Diagnosing Phys: Stanly Leavens MD PROCEDURE: After  discussion of the risks and benefits of a TEE, an informed consent was obtained from the patient. TEE procedure time was 35 minutes. The transesophogeal probe was passed without difficulty through the esophogus of the patient. Imaged were obtained with the patient in a left lateral decubitus position. Sedation performed by different physician. The patient was monitored while under deep sedation. Anesthestetic sedation was provided intravenously by Anesthesiology: 396mg  of Propofol , 100mg  of Lidocaine . Supplementary images were obtained from transthoracic windows as indicated to answer the clinical question. The patient developed no complications during the procedure.  IMPRESSIONS  1. Left ventricular ejection fraction, by estimation, is 60 to 65%. The left ventricle has normal function.  2. Right ventricular systolic function is normal. The right ventricular size is normal.  3. No left atrial/left atrial appendage thrombus was detected.  4. The mitral valve is degenerative. Moderate to severe mitral valve regurgitation. Mild mitral stenosis. The mean mitral valve gradient is 3.0 mmHg with average heart rate of 60 bpm. Severe mitral annular calcification.  5. The aortic valve has been replaced by a 25 mm Magna Ease. Aortic valve regurgitation is not  visualized. Severe aortic valve stenosis. 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 Procedure Date: 08/16/2011. 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 Aortic valve area, by VTI measures 0.69 cm. Aortic valve mean gradient measures 53.5 mmHg. Aortic valve Vmax measures 4.79 m/s. Acceleration time 119 msec. Consider evaluation for prosthetic valve stenosis.  6. 3D performed of the mitral valve and demonstrates 3D assessment of Mitral Valve: There are three jets of mitral regurgiation at A1P1, A2P2, and A3,P3. Summation of 3D VCA 0.562 (severe) but with only systolic blunting of the pulmonary vein flow. Color flow more prominent at 123/78 mm Hg. 3D MV Area 3.58 cm2. The anterior annular calcification about A2 is protruding and would make mTEER challening, along with baseline MVA.  7. The inferior vena cava is dilated in size with >50% respiratory variability, suggesting right atrial pressure of 8 mmHg. FINDINGS  Left Ventricle: Left ventricular ejection fraction, by estimation, is 60 to 65%. The left ventricle has normal function. The left ventricular internal cavity size was normal in size. Right Ventricle: The right ventricular size is normal. No increase in right ventricular wall thickness. Right ventricular systolic function is normal. Left Atrium: Left atrial size was normal in size. No left atrial/left atrial appendage thrombus was detected. Right Atrium: Right atrial size was normal in size. Pericardium: There is no evidence of pericardial effusion. Mitral Valve: The mitral valve is degenerative in appearance. Severe mitral annular calcification. Moderate to severe mitral valve regurgitation. Mild mitral valve stenosis. MV peak gradient, 7.1 mmHg. The mean mitral valve gradient is 3.0 mmHg with average heart rate of 60 bpm. Tricuspid Valve: The tricuspid valve is normal in structure. Tricuspid valve regurgitation is trivial. Aortic Valve: The aortic valve  has been repaired/replaced. Aortic valve regurgitation is not visualized. Severe aortic stenosis is present. Aortic valve mean gradient measures 53.5 mmHg. Aortic valve peak gradient measures 91.8 mmHg. Aortic valve area, by VTI measures 0.69 cm. 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 Procedure Date: 08/16/2011. Pulmonic Valve: The pulmonic valve was grossly normal. Pulmonic valve regurgitation is not visualized. Aorta: The aortic root and ascending aorta are structurally normal, with no evidence of dilitation. Venous: A systolic blunting flow pattern is recorded from the left upper pulmonary vein. The inferior vena cava is dilated in size with greater  than 50% respiratory variability, suggesting right atrial pressure of 8 mmHg. IAS/Shunts: The interatrial septum appears to be lipomatous. No atrial level shunt detected by color flow Doppler. Additional Comments: 3D was performed not requiring image post processing on an independent workstation and was normal. Spectral Doppler performed. LEFT VENTRICLE PLAX 2D LVOT diam:     2.30 cm   Diastology LV SV:         79        LV e' medial:    6.28 cm/s LV SV Index:   36        LV E/e' medial:  22.8 LVOT Area:     4.15 cm  LV e' lateral:   6.07 cm/s                          LV E/e' lateral: 23.6  AORTIC VALVE AV Area (Vmax):    0.68 cm AV Area (Vmean):   0.75 cm AV Area (VTI):     0.69 cm AV Vmax:           479.00 cm/s AV Vmean:          342.500 cm/s AV VTI:            1.135 m AV Peak Grad:      91.8 mmHg AV Mean Grad:      53.5 mmHg LVOT Vmax:         78.20 cm/s LVOT Vmean:        61.500 cm/s LVOT VTI:          0.189 m LVOT/AV VTI ratio: 0.17 MITRAL VALVE MV Area (PHT): 2.55 cm     SHUNTS MV Area VTI:   2.16 cm     Systemic VTI:  0.19 m MV Peak grad:  7.1 mmHg     Systemic Diam: 2.30 cm MV Mean grad:  3.0 mmHg MV Vmax:       1.33 m/s MV Vmean:      74.2 cm/s MV Decel Time: 297 msec MV E velocity: 143.00 cm/s MV A velocity: 61.10 cm/s MV  E/A ratio:  2.34 Stanly Leavens MD Electronically signed by Stanly Leavens MD Signature Date/Time: 05/15/2024/6:03:38 PM    Final    CARDIAC CATHETERIZATION Result Date: 05/15/2024 Images from the original result were not included. Cardiac Catheterization 05/15/24: Hemodynamic data: RA 23/22, mean 18 mmHg. RV 63/3, EDP 23 mmHg. PA 68/30, mean 43 mmHg. PW 33/36, mean 31 mmHg. PA saturation 53%, AO saturation 92%. QP/QS 1.00.  CO 3.43, CI 1.57, markedly reduced.  PAPi 2.1. Angiographic data: Left main nonexistent and has separate ostia for the LAD and CX immediately bifurcate. LAD gives origin to large D3.  The LAD is smooth and normal.  Ends at the apex. LCx: Large-caliber vessel, smooth and normal.  Gives origin to large OM 2-3.  Smooth and normal. RCA: Very large vessel, large PDA and large PL branch, smooth and normal. LIMA to D3: Widely patent. Impression and recommendations: No coronary artery disease by cardiac catheterization.  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  PAPi preserved suggesting preserved RV function.  Significant volume excess still persist. Increased furosemide  to 40 mg twice daily, added Jardiance  10 mg daily.   EP STUDY Result Date: 05/15/2024 See surgical note for result.  DG Chest 2 View Result Date: 05/14/2024 CLINICAL DATA:  CHF EXAM: CHEST - 2 VIEW COMPARISON:  05/13/2024 FINDINGS: Prior median sternotomy and valve replacement. Chronic increased markings throughout the lungs. No acute confluent opacities or effusions. Heart and mediastinal contours are within normal limits. No acute bony abnormality. IMPRESSION: Stable chronic changes.  No active disease. Electronically Signed   By: Franky Crease M.D.   On: 05/14/2024 18:25   ECHOCARDIOGRAM COMPLETE Result Date: 05/14/2024    ECHOCARDIOGRAM REPORT   Patient Name:   Kirk [name] Date of Exam: 05/14/2024 Medical Rec #:  [id number]       Height:       71.0 in Accession #:    [id number]     Weight:       224.5 lb Date of Birth:  1946-03-21      BSA:          2.215 m Patient Age:    78 years       BP:           120/52 mmHg Patient Gender: M              HR:           81 bpm. Exam Location:  Inpatient Procedure: 2D Echo, Cardiac Doppler and Color Doppler (Both Spectral and Color            Flow Doppler were utilized during procedure). Indications:    I50.40* Unspecified combined systolic (congestive) and diastolic                 (congestive) heart failure  History:        Patient has prior history of Echocardiogram examinations, most                 recent 10/01/2023. CAD, Prior CABG and Abnormal ECG, Aortic Valve                 Disease, Arrythmias:Atrial Fibrillation, Signs/Symptoms:Dyspnea                 and Shortness of Breath; Risk Factors:Hypertension and Diabetes.                 Pulmonary embolus.                 Aortic Valve: 25 mm Magna valve is present in the aortic                 position. Procedure Date: 2012.  Sonographer:    Ellouise Mose RDCS Referring Phys: [phone] REDIA SAILOR [facility]  Sonographer Comments: Technically difficult study due to poor echo windows. Image acquisition challenging due to patient body habitus. Very difficult to obtain AV gradient. Study interrrupted for PA consult for at least 17 minutes. IMPRESSIONS  1. Left ventricular ejection fraction, by estimation, is 70 to 75%. Left ventricular ejection fraction by 2D MOD biplane is 71.3 %. The left ventricle has hyperdynamic function. The left ventricle has no regional wall motion abnormalities. There is severe asymmetric left ventricular hypertrophy of the basal-septal segment. Left ventricular diastolic function could not be evaluated.  2. Right ventricular systolic function is low normal. The right ventricular size is normal. There is normal pulmonary artery systolic pressure. The estimated right ventricular systolic pressure is 24.9 mmHg.  3. Left atrial size was  moderately dilated.  4. The mitral valve is degenerative. Trivial mitral valve regurgitation. Moderate mitral annular calcification.  5. The aortic valve has been repaired/replaced. Aortic valve regurgitation is trivial. Moderate aortic valve stenosis. There is a 25 mm Magna valve present in the aortic  position. Procedure Date: 2012. Echo findings are consistent with normal structure and function of the aortic valve prosthesis. Aortic valve area, by VTI measures 1.36 cm. Aortic valve mean gradient measures 14.0 mmHg. Aortic valve Vmax measures 2.61 m/s.  6. Aortic dilatation noted. There is borderline dilatation of the ascending aorta, measuring 38 mm. Comparison(s): Changes from prior study are noted. 10/01/2023: LVEF 60-65%, bioprosthetic AVR - MG 21 mmHg. FINDINGS  Left Ventricle: Left ventricular ejection fraction, by estimation, is 70 to 75%. Left ventricular ejection fraction by 2D MOD biplane is 71.3 %. The left ventricle has hyperdynamic function. The left ventricle has no regional wall motion abnormalities. The left ventricular internal cavity size was normal in size. There is severe asymmetric left ventricular hypertrophy of the basal-septal segment. Left ventricular diastolic function could not be evaluated due to atrial fibrillation. Left ventricular diastolic function could not be evaluated. Right Ventricle: The right ventricular size is normal. No increase in right ventricular wall thickness. Right ventricular systolic function is low normal. There is normal pulmonary artery systolic pressure. The tricuspid regurgitant velocity is 2.34 m/s,  and with an assumed right atrial pressure of 3 mmHg, the estimated right ventricular systolic pressure is 24.9 mmHg. Left Atrium: Left atrial size was moderately dilated. Right Atrium: Right atrial size was normal in size. Pericardium: There is no evidence of pericardial effusion. Mitral Valve: The mitral valve is degenerative in appearance. Moderate mitral annular  calcification. Trivial mitral valve regurgitation. MV peak gradient, 15.3 mmHg. The mean mitral valve gradient is 7.0 mmHg. Tricuspid Valve: The tricuspid valve is grossly normal. Tricuspid valve regurgitation is trivial. Aortic Valve: The aortic valve has been repaired/replaced. Aortic valve regurgitation is trivial. Moderate aortic stenosis is present. Aortic valve mean gradient measures 14.0 mmHg. Aortic valve peak gradient measures 27.2 mmHg. Aortic valve area, by VTI  measures 1.36 cm. There is a 25 mm Magna valve present in the aortic position. Procedure Date: 2012. Echo findings are consistent with normal structure and function of the aortic valve prosthesis. Pulmonic Valve: The pulmonic valve was normal in structure. Pulmonic valve regurgitation is not visualized. Aorta: Aortic dilatation noted. There is borderline dilatation of the ascending aorta, measuring 38 mm. IAS/Shunts: No atrial level shunt detected by color flow Doppler.  LEFT VENTRICLE PLAX 2D                        Biplane EF (MOD) LVIDd:         4.49 cm         LV Biplane EF:   Left LVIDs:         3.20 cm                          ventricular LV PW:         1.34 cm                          ejection LV IVS:        1.70 cm                          fraction by LVOT diam:     2.38 cm                          2D MOD LV SV:  73                               biplane is LV SV Index:   33                               71.3 %. LVOT Area:     4.45 cm                                Diastology                                LV e' medial:    5.66 cm/s LV Volumes (MOD)               LV E/e' medial:  33.4 LV vol d, MOD    75.2 ml       LV e' lateral:   8.27 cm/s A2C:                           LV E/e' lateral: 22.9 LV vol d, MOD    83.1 ml A4C: LV vol s, MOD    19.0 ml A2C: LV vol s, MOD    27.2 ml A4C: LV SV MOD A2C:   56.2 ml LV SV MOD A4C:   83.1 ml LV SV MOD BP:    57.2 ml RIGHT VENTRICLE            IVC RV S prime:     6.85 cm/s  IVC diam: 1.97 cm  TAPSE (M-mode): 1.7 cm LEFT ATRIUM             Index        RIGHT ATRIUM           Index LA diam:        4.42 cm 2.00 cm/m   RA Area:     16.50 cm LA Vol (A2C):   62.1 ml 28.04 ml/m  RA Volume:   42.00 ml  18.96 ml/m LA Vol (A4C):   89.7 ml 40.50 ml/m LA Biplane Vol: 78.3 ml 35.35 ml/m  AORTIC VALVE AV Area (Vmax):    1.37 cm AV Area (Vmean):   1.43 cm AV Area (VTI):     1.36 cm AV Vmax:           260.67 cm/s AV Vmean:          172.000 cm/s AV VTI:            0.541 m AV Peak Grad:      27.2 mmHg AV Mean Grad:      14.0 mmHg LVOT Vmax:         80.50 cm/s LVOT Vmean:        55.300 cm/s LVOT VTI:          0.165 m LVOT/AV VTI ratio: 0.30  AORTA Ao Root diam: 3.13 cm Ao Asc diam:  3.72 cm MITRAL VALVE                TRICUSPID VALVE MV Area (PHT): 3.99 cm     TR Peak grad:   21.9 mmHg MV Area VTI:   1.56 cm     TR Vmax:  234.00 cm/s MV Peak grad:  15.3 mmHg MV Mean grad:  7.0 mmHg     SHUNTS MV Vmax:       1.96 m/s     Systemic VTI:  0.16 m MV Vmean:      109.9 cm/s   Systemic Diam: 2.38 cm MV Decel Time: 190 msec MV E velocity: 189.00 cm/s MV A velocity: 63.50 cm/s MV E/A ratio:  2.98 Vinie Maxcy MD Electronically signed by Vinie Maxcy MD Signature Date/Time: 05/14/2024/4:38:49 PM    Final        Nydia Distance M.D. [facility] 05/16/2024, 11:26 AM  Available via Epic secure chat 7am-7pm After 7 pm, please refer to night coverage provider listed on amion.

## 2024-05-16 NOTE — Progress Notes (Signed)
 Ok to transition heparin  back to apixaban  per Thom Sluder, PA.  Sergio Batch, PharmD, BCIDP, AAHIVP, CPP Infectious Disease Pharmacist 05/16/2024 9:16 AM

## 2024-05-16 NOTE — Telephone Encounter (Signed)
 Spoke to patient's wife she stated she would like to speak to Dr.Skains about husband who is presently in hospital.Stated she would like Dr.Skains to review his chart and give his advice.Stated he has been her husband's Dr.for a long time and she would feel better with his advice.Advised Dr.Skains is in clinic today.I will send message to him.

## 2024-05-16 NOTE — Telephone Encounter (Signed)
 Patient Product/process development scientist completed.    The patient is insured through [facility]. Patient has Medicare and is not eligible for a copay card, but may be able to apply for patient assistance or Medicare RX Payment Plan (Patient Must reach out to their plan, if eligible for payment plan), if available.    Ran test claim for Jardiance  10 mg and the current 30 day co-pay is $0.00.   This test claim was processed through [facility]- copay amounts may vary at other pharmacies due to pharmacy/plan contracts, or as the patient moves through the different stages of their insurance plan.     Reyes Sharps, CPHT Pharmacy Technician III Certified Patient Advocate [facility] Patient Advocate Team Direct Number: [phone]  Fax: [phone]

## 2024-05-16 NOTE — Progress Notes (Signed)
   05/16/24 1513  TOC Brief Assessment  Insurance and Status Reviewed  Patient has primary care physician Yes  Home environment has been reviewed home w/ spouse  Prior level of function: independent  Prior/Current Home Services No current home services  Social Drivers of Health Review SDOH reviewed no interventions necessary  Readmission risk has been reviewed Yes  Transition of care needs no transition of care needs at this time    Inpatient Care Management (ICM) will continue to monitor patient advancement through interdisciplinary progression rounds. If new patient transition needs arise, please place a ICM (CM/CSW) consult.

## 2024-05-16 NOTE — Plan of Care (Signed)
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
## 2024-05-17 ENCOUNTER — Other Ambulatory Visit: Payer: Self-pay

## 2024-05-17 DIAGNOSIS — I5033 Acute on chronic diastolic (congestive) heart failure: Secondary | ICD-10-CM

## 2024-05-17 DIAGNOSIS — I509 Heart failure, unspecified: Secondary | ICD-10-CM | POA: Diagnosis not present

## 2024-05-17 DIAGNOSIS — J9601 Acute respiratory failure with hypoxia: Secondary | ICD-10-CM | POA: Diagnosis not present

## 2024-05-17 DIAGNOSIS — N1831 Chronic kidney disease, stage 3a: Secondary | ICD-10-CM | POA: Diagnosis not present

## 2024-05-17 DIAGNOSIS — T82857A Stenosis of cardiac prosthetic devices, implants and grafts, initial encounter: Secondary | ICD-10-CM

## 2024-05-17 LAB — CBC
HCT: 54.5 % — ABNORMAL HIGH (ref 39.0–52.0)
Hemoglobin: 17.2 g/dL — ABNORMAL HIGH (ref 13.0–17.0)
MCH: 30 pg (ref 26.0–34.0)
MCHC: 31.6 g/dL (ref 30.0–36.0)
MCV: 95.1 fL (ref 80.0–100.0)
Platelets: 176 K/uL (ref 150–400)
RBC: 5.73 MIL/uL (ref 4.22–5.81)
RDW: 15.5 % (ref 11.5–15.5)
WBC: 8.4 K/uL (ref 4.0–10.5)
nRBC: 0 % (ref 0.0–0.2)

## 2024-05-17 LAB — COOXEMETRY PANEL
Carboxyhemoglobin: 2.3 % — ABNORMAL HIGH (ref 0.5–1.5)
Methemoglobin: 0.8 % (ref 0.0–1.5)
O2 Saturation: 73.1 %
Total hemoglobin: 16.6 g/dL — ABNORMAL HIGH (ref 12.0–16.0)

## 2024-05-17 LAB — RENAL FUNCTION PANEL
Albumin: 3.7 g/dL (ref 3.5–5.0)
Anion gap: 17 — ABNORMAL HIGH (ref 5–15)
BUN: 32 mg/dL — ABNORMAL HIGH (ref 8–23)
CO2: 23 mmol/L (ref 22–32)
Calcium: 9.5 mg/dL (ref 8.9–10.3)
Chloride: 96 mmol/L — ABNORMAL LOW (ref 98–111)
Creatinine, Ser: 1.16 mg/dL (ref 0.61–1.24)
GFR, Estimated: >60 mL/min (ref >60)
Glucose, Bld: 112 mg/dL — ABNORMAL HIGH (ref 70–99)
Phosphorus: 4.6 mg/dL (ref 2.5–4.6)
Potassium: 5.5 mmol/L — ABNORMAL HIGH (ref 3.5–5.1)
Sodium: 136 mmol/L (ref 135–145)

## 2024-05-17 LAB — HEPARIN LEVEL (UNFRACTIONATED): Heparin Unfractionated: 0.77 [IU]/mL — ABNORMAL HIGH (ref 0.30–0.70)

## 2024-05-17 LAB — GLUCOSE, CAPILLARY
Glucose-Capillary: 115 mg/dL — ABNORMAL HIGH (ref 70–99)
Glucose-Capillary: 164 mg/dL — ABNORMAL HIGH (ref 70–99)
Glucose-Capillary: 93 mg/dL (ref 70–99)
Glucose-Capillary: 142 mg/dL — ABNORMAL HIGH (ref 70–99)

## 2024-05-17 LAB — APTT: aPTT: 90 s — ABNORMAL HIGH (ref 24–36)

## 2024-05-17 LAB — LACTIC ACID, PLASMA: Lactic Acid, Venous: 2.6 mmol/L (ref 0.5–1.9)

## 2024-05-17 LAB — LIPOPROTEIN A (LPA): Lipoprotein (a): <8.4 nmol/L (ref <75.0)

## 2024-05-17 MED ORDER — FUROSEMIDE 10 MG/ML IJ SOLN
40.0000 mg | Freq: Every day | INTRAMUSCULAR | Status: DC
  Administered 2024-05-17 – 2024-05-18 (×2): 40 mg via INTRAVENOUS
  Filled 2024-05-17 (×2): qty 4

## 2024-05-17 MED ORDER — SODIUM CHLORIDE 0.9% FLUSH
10.0000 mL | Freq: Two times a day (BID) | INTRAVENOUS | Status: DC
  Administered 2024-05-17: 10 mL
  Administered 2024-05-17: 20 mL
  Administered 2024-05-18: 10 mL
  Administered 2024-05-18: 20 mL
  Administered 2024-05-19 – 2024-05-20 (×3): 10 mL

## 2024-05-17 MED ORDER — ORAL CARE MOUTH RINSE: 15.0000 mL | OROMUCOSAL | Status: DC | PRN

## 2024-05-17 MED ORDER — SODIUM CHLORIDE 0.9% FLUSH: 10.0000 mL | INTRAVENOUS | Status: DC | PRN

## 2024-05-17 MED ORDER — CHLORHEXIDINE GLUCONATE CLOTH 2 % EX PADS
6.0000 | MEDICATED_PAD | Freq: Every day | CUTANEOUS | Status: DC
  Administered 2024-05-17 – 2024-05-19 (×3): 6 via TOPICAL

## 2024-05-17 NOTE — Progress Notes (Signed)
 Peripherally Inserted Central Catheter Placement  The IV Nurse has discussed with the patient and/or persons authorized to consent for the patient, the purpose of this procedure and the potential benefits and risks involved with this procedure.  The benefits include less needle sticks, lab draws from the catheter, and the patient may be discharged home with the catheter. Risks include, but not limited to, infection, bleeding, blood clot (thrombus formation), and puncture of an artery; nerve damage and irregular heartbeat and possibility to perform a PICC exchange if needed/ordered by physician.  Alternatives to this procedure were also discussed.  Bard Power PICC patient education guide, fact sheet on infection prevention and patient information card has been provided to patient /or left at bedside.    PICC Placement Documentation  PICC Double Lumen 05/17/24 Right Brachial 37 cm 0 cm (Active)  Indication for Insertion or Continuance of Line Chronic illness with exacerbations (CF, Sickle Cell, etc.) 05/17/24 0946  Exposed Catheter (cm) 0 cm 05/17/24 0946  Site Assessment Clean, Dry, Intact 05/17/24 0946  Lumen #1 Status Flushed;Saline locked;Blood return noted 05/17/24 0946  Lumen #2 Status Flushed;Saline locked;Blood return noted 05/17/24 0946  Dressing Type Transparent;Securing device 05/17/24 0946  Dressing Status Antimicrobial disc/dressing in place;Clean, Dry, Intact 05/17/24 0946  Line Care Connections checked and tightened 05/17/24 0946  Line Adjustment (NICU/IV Team Only) No 05/17/24 0946  Dressing Intervention New dressing;Adhesive placed at insertion site (IV team only) 05/17/24 0946  Dressing Change Due 05/24/24 05/17/24 0946       Nai Dasch Sheral Ruth 05/17/2024, 9:48 AM

## 2024-05-17 NOTE — Plan of Care (Signed)
  Problem: Education: Goal: Knowledge of General Education information will improve Description: Including pain rating scale, medication(s)/side effects and non-pharmacologic comfort measures Outcome: Progressing   Problem: Health Behavior/Discharge Planning: Goal: Ability to manage health-related needs will improve Outcome: Progressing   Problem: Clinical Measurements: Goal: Ability to maintain clinical measurements within normal limits will improve Outcome: Progressing Goal: Will remain free from infection Outcome: Progressing Goal: Diagnostic test results will improve Outcome: Progressing Goal: Respiratory complications will improve Outcome: Progressing Goal: Cardiovascular complication will be avoided Outcome: Progressing   Problem: Activity: Goal: Risk for activity intolerance will decrease Outcome: Progressing   Problem: Nutrition: Goal: Adequate nutrition will be maintained Outcome: Progressing   Problem: Coping: Goal: Level of anxiety will decrease Outcome: Progressing   Problem: Elimination: Goal: Will not experience complications related to bowel motility Outcome: Progressing Goal: Will not experience complications related to urinary retention Outcome: Progressing   Problem: Pain Managment: Goal: General experience of comfort will improve and/or be controlled Outcome: Progressing   Problem: Safety: Goal: Ability to remain free from injury will improve Outcome: Progressing   Problem: Skin Integrity: Goal: Risk for impaired skin integrity will decrease Outcome: Progressing   Problem: Education: Goal: Ability to describe self-care measures that may prevent or decrease complications (Diabetes Survival Skills Education) will improve Outcome: Progressing   Problem: Coping: Goal: Ability to adjust to condition or change in health will improve Outcome: Progressing   Problem: Fluid Volume: Goal: Ability to maintain a balanced intake and output will  improve Outcome: Progressing   Problem: Health Behavior/Discharge Planning: Goal: Ability to identify and utilize available resources and services will improve Outcome: Progressing Goal: Ability to manage health-related needs will improve Outcome: Progressing   Problem: Metabolic: Goal: Ability to maintain appropriate glucose levels will improve Outcome: Progressing   Problem: Nutritional: Goal: Maintenance of adequate nutrition will improve Outcome: Progressing Goal: Progress toward achieving an optimal weight will improve Outcome: Progressing   Problem: Skin Integrity: Goal: Risk for impaired skin integrity will decrease Outcome: Progressing   Problem: Tissue Perfusion: Goal: Adequacy of tissue perfusion will improve Outcome: Progressing   Problem: Education: Goal: Understanding of CV disease, CV risk reduction, and recovery process will improve Outcome: Progressing Goal: Individualized Educational Video(s) Outcome: Progressing   Problem: Activity: Goal: Ability to return to baseline activity level will improve Outcome: Progressing   Problem: Cardiovascular: Goal: Ability to achieve and maintain adequate cardiovascular perfusion will improve Outcome: Progressing Goal: Vascular access site(s) Level 0-1 will be maintained Outcome: Progressing   Problem: Health Behavior/Discharge Planning: Goal: Ability to safely manage health-related needs after discharge will improve Outcome: Progressing

## 2024-05-17 NOTE — Progress Notes (Addendum)
 Rounding Note   Patient Name: Kirk [name] Date of Encounter: 05/17/2024  [facility] Cardiologist: Oneil Parchment, MD   Subjective BP 135/67.  Net -4.5 L yesterday.  Denies any chest pain or dyspnea  Scheduled Meds:  aspirin  EC  81 mg Oral Daily   empagliflozin   10 mg Oral Daily   furosemide   80 mg Intravenous BID   insulin  aspart  0-9 Units Subcutaneous TID WC   levothyroxine   50 mcg Oral QAC breakfast   metoprolol  tartrate  25 mg Oral BID   mirabegron  ER  25 mg Oral Daily   rosuvastatin   40 mg Oral QHS   Continuous Infusions:  cefTRIAXone  (ROCEPHIN )  IV Stopped (05/16/24 1735)   doxycycline  (VIBRAMYCIN ) IV Stopped (05/16/24 2334)   heparin  1,550 Units/hr (05/17/24 0408)   PRN Meds: acetaminophen  **OR** acetaminophen , albuterol , melatonin, sodium chloride  flush   Vital Signs  Vitals:   05/16/24 2123 05/17/24 0005 05/17/24 0533 05/17/24 0550  BP: 120/67 123/64  135/67  Pulse: 89 65 (!) 256 65  Resp:  20  20  Temp:  97.8 F (36.6 C)    TempSrc:  Oral    SpO2:  91% 98% 93%  Weight:   94.4 kg   Height:        Intake/Output Summary (Last 24 hours) at 05/17/2024 0558 Last data filed at 05/17/2024 0408 Gross per 24 hour  Intake 605.84 ml  Output 5150 ml  Net -4544.16 ml      05/17/2024    5:33 AM 05/16/2024    6:00 AM 05/15/2024    6:21 AM  Last 3 Weights  Weight (lbs) 208 lb 1.6 oz 210 lb 12.2 oz 218 lb 1.6 oz  Weight (kg) 94.394 kg 95.6 kg 98.93 kg      Telemetry NSR - Personally Reviewed  ECG  NO new ECG - Personally Reviewed  Physical Exam  GEN: No acute distress.   Neck: + JVD Cardiac: RRR, 3/6 systolic murmur Respiratory: Diminished breath sounds GI: Soft, nontender, non-distended  MS: No edema; cool extremities Neuro:  Nonfocal  Psych: Normal affect   Labs High Sensitivity Troponin:  No results for input(s): TROPONINIHS in the last 720 hours.   Chemistry Recent Labs  Lab 05/13/24 1708 05/14/24 0322 05/15/24 0640  05/15/24 1621 05/15/24 1626 05/16/24 0313  NA  --  140 138 137  137 137 136  K  --  5.2* 4.0 4.5  4.5 4.5 4.2  CL  --  103 99  --   --  98  CO2  --  22 27  --   --  24  GLUCOSE  --  153* 96  --   --  95  BUN  --  25* 31*  --   --  31*  CREATININE  --  1.43* 1.18  --   --  1.20  CALCIUM   --  8.7* 8.8*  --   --  8.9  MG 1.8 1.9  --   --   --   --   PROT  --  7.0  --   --   --   --   ALBUMIN   --  3.7 3.7  --   --  3.6  AST  --  35  --   --   --   --   ALT  --  25  --   --   --   --   ALKPHOS  --  61  --   --   --   --  BILITOT  --  1.2  --   --   --   --   GFRNONAA  --  51* >60  --   --  >60  ANIONGAP  --  15 12  --   --  14    Lipids No results for input(s): CHOL, TRIG, HDL, LABVLDL, LDLCALC, CHOLHDL in the last 168 hours.  Hematology Recent Labs  Lab 05/14/24 0754 05/15/24 0640 05/15/24 1621 05/15/24 1626 05/16/24 0313  WBC 19.5* 17.2*  --   --  12.1*  RBC 4.82 5.34  --   --  5.34  HGB 14.4 16.0 16.3  16.3 16.3 16.1  HCT 46.3 50.1 48.0  48.0 48.0 50.6  MCV 96.1 93.8  --   --  94.8  MCH 29.9 30.0  --   --  30.1  MCHC 31.1 31.9  --   --  31.8  RDW 16.0* 16.2*  --   --  15.7*  PLT 163 196  --   --  155   Thyroid   Recent Labs  Lab 05/14/24 0322  TSH 1.887    BNP Recent Labs  Lab 05/10/24 0916 05/13/24 1425 05/14/24 1810  BNP  --   --  907.6*  PROBNP 1,252.0* 2,462.0*  --     DDimer No results for input(s): DDIMER in the last 168 hours.   Radiology  CT CORONARY MORPH W/CTA COR W/SCORE W/CA W/CM &/OR WO/CM Addendum Date: 05/16/2024 ADDENDUM REPORT: 05/16/2024 16:50 EXAM: OVER-READ INTERPRETATION  CT CHEST The following report is an over-read performed by radiologist Dr. Lynwood Seip [facility] on 05/16/2024. This over-read does not include interpretation of cardiac or coronary anatomy or pathology. The coronary CTA interpretation by the cardiologist is attached. COMPARISON:  None. FINDINGS: The visualized portions of the  extracardiac vascular structures of the chest are unremarkable. Visualized mediastinum is unremarkable. Visualized portion of upper abdomen is unremarkable. Small right pleural effusion is noted with minimal adjacent subsegmental atelectasis. Patchy airspace opacities are seen in both upper lobes concerning for multifocal pneumonia. Visualized skeleton is unremarkable. IMPRESSION: Small right pleural effusion is noted with minimal adjacent subsegmental atelectasis. Patchy airspace opacities are seen in both upper lobes concerning for multifocal pneumonia. Electronically Signed   By: Lynwood Seip Raddle M.D.   On: 05/16/2024 16:50   Result Date: 05/16/2024 CLINICAL DATA:  Aortic Valve pathology with assessment for TAVR (prior SAVR) EXAM: Cardiac TAVR CT TECHNIQUE: The patient was scanned on a Siemens Force 192 slice scanner. A 120 kV retrospective scan was triggered in the descending thoracic aorta at 111 HU's. Gantry rotation speed was 270 msecs and collimation was .9 mm. No beta blockade or nitro were given. The 3D data set was reconstructed in 5% intervals of the R-R cycle. Systolic and diastolic phases were analyzed on a dedicated work station using MPR, MIP and VRT modes. The patient received 100 cc of contrast. FINDINGS: Prosthetic Valve: 25 mm Edwards Magna Ease Pericardial Tissue Valve (implanted 08/16/2011). Hypoattenuation and degree: All three prosthetic leaflets are prominently calcified. In cine evaluation, the non and the left leaflet are significantly hypokinetic and restricted. Normal motion of the right leaflet. Mild annular hypoattenuation. Annular calcification: Severe- large protruding LVOT/subannular calcification. The intervalvular fibrosa is heavily calcified. Mitral Valve: The is anterior leaflet restriction that arises from large anterior annular calcification that extends to restriction the A2 leaflet. Mild posterior annular calcification. Presence of basal septal hypertrophy: Severe, 16 mm  Perimembranous septal diameter: 4 mm Aortic Measurements- 65%  phase due to artifact Sinotubular Junction: 29 mm. Adventitial thickening noted. This may be related to post-operative changes. Ascending Thoracic Aorta: 36 mm Descending Thoracic Aorta: 26 mm Optimum Fluoroscopic Angle for Delivery: LAO 5, CAU 4 Left Main Coronary angle: RAO 0, CAU 11 Valves for structural team consideration (with Valve in Valve App Support): 26 mm Sapien is favored. Left main virtual Valve to coronary distance: 4 mm Right coronary virtual Valve to coronary distance: 5 mm Evolut Left main virtual Valve to coronary distance: 3 mm Right coronary virtual Valve to coronary distance:4 mm In the setting of severe subannular calcification, study assumes a non-fractured valve strategy. Left main coronary height 7 mm. Left SOV height 14 mm. Estimated left prosthetic leaflet length 14 mm. Recommend heart team meeting for left coronary system deployment planning. Right coronary height 8 mm. Right SOV height 15 mm. Non TAVR Valve Findings: Coronary Arteries: Normal coronary origin. Study not completed with nitroglycerin . Suboptimal coronary assessment due to motion. Suggests clinical correlation to 05/15/24 heart catheterization. Systemic veins: Normal caliber Main Pulmonary artery: Normal caliber Pulmonary veins: Normal anatomy. Left atrial appendage: Patent. Interatrial septum: Lipomatous interatrial septal hypertrophy noted without a left to right communication. Chamber dimensions: Left atrial dilation. Pericardium: No calcification. Extra Cardiac Findings as per separate reporting. Notable artifacts: Slab and motion artifact; this affect the diastolic assessment of the annulus. Image quality: Poor IMPRESSION: 1. Prosthetic aortic stenosis. Findings pertinent to TAVR procedure are detailed above. RECOMMENDATIONS: The proposed cut-off value of 1,651 AU yielded a 93 % sensitivity and 75 % specificity in grading AS severity in patients with classical  low-flow, low-gradient AS. Proposed different cut-off values to define severe AS for men and women as 2,065 AU and 1,274 AU, respectively. The joint European and American recommendations for the assessment of AS consider the aortic valve calcium  score as a continuum - a very high calcium  score suggests severe AS and a low calcium  score suggests severe AS is unlikely. Donney VEAR Jarome LULLA Stephen RENETTE, et al. 2017 ESC/EACTS Guidelines for the management of valvular heart disease. Eur Heart J [phone] Coronary artery calcium  (CAC) score is a strong predictor of incident coronary heart disease (CHD) and provides predictive information beyond traditional risk factors. CAC scoring is reasonable to use in the decision to withhold, postpone, or initiate statin therapy in intermediate-risk or selected borderline-risk asymptomatic adults (age 15-75 years and LDL-C >=70 to <190 mg/dL) who do not have diabetes or established atherosclerotic cardiovascular disease (ASCVD).* In intermediate-risk (10-year ASCVD risk >=7.5% to <20%) adults or selected borderline-risk (10-year ASCVD risk >=5% to <7.5%) adults in whom a CAC score is measured for the purpose of making a treatment decision the following recommendations have been made: If CAC = 0, it is reasonable to withhold statin therapy and reassess in 5 to 10 years, as long as higher risk conditions are absent (diabetes mellitus, family history of premature CHD in first degree relatives (males <55 years; females <65 years), cigarette smoking, LDL >=190 mg/dL or other independent risk factors). If CAC is 1 to 99, it is reasonable to initiate statin therapy for patients >=62 years of age. If CAC is >=100 or >=75th percentile, it is reasonable to initiate statin therapy at any age. Cardiology referral should be considered for patients with CAC scores >=400 or >=75th percentile. *2018 AHA/ACC/AACVPR/AAPA/ABC/ACPM/ADA/AGS/APhA/ASPC/NLA/PCNA Guideline on the Management of Blood  Cholesterol: A Report of the American College of Cardiology/American Heart Association Task Force on Clinical Practice Guidelines. J Am Coll Cardiol. 2019;73(24):3168-3209. Mahesh  Chandrasekhar Electronically Signed: By: Stanly Leavens M.D. On: 05/16/2024 16:42   CT Angio Abd/Pel w/ and/or w/o Result Date: 05/16/2024 CLINICAL DATA:  Preop for transcatheter aortic valve repair. EXAM: CT ANGIOGRAPHY CHEST, ABDOMEN AND PELVIS TECHNIQUE: Non-contrast CT of the chest was initially obtained. Multidetector CT imaging through the chest, abdomen and pelvis was performed using the standard protocol during bolus administration of intravenous contrast. Multiplanar reconstructed images and MIPs were obtained and reviewed to evaluate the vascular anatomy. RADIATION DOSE REDUCTION: This exam was performed according to the departmental dose-optimization program which includes automated exposure control, adjustment of the mA and/or kV according to patient size and/or use of iterative reconstruction technique. CONTRAST:  OMNIPAQUE  IOHEXOL  350 MG/ML SOLN COMPARISON:  April 25, 2022. FINDINGS: CTA CHEST FINDINGS Cardiovascular: Status post aortic valve repair. There is no evidence of thoracic aortic dissection or aneurysm. Status post coronary bypass graft. Great vessels are widely patent without stenosis. Mediastinum/Nodes: Esophagus is unremarkable. Thyroid  gland is unremarkable. Calcified mediastinal lymph nodes are again noted suggesting prior granulomatous disease. Lungs/Pleura: Small right pleural effusion is noted with minimal adjacent subsegmental atelectasis. Patchy airspace opacities are noted throughout both lungs most consistent with multifocal pneumonia. Musculoskeletal: No chest wall abnormality. No acute or significant osseous findings. Review of the MIP images confirms the above findings. CTA ABDOMEN AND PELVIS FINDINGS VASCULAR Aorta: Atherosclerosis of abdominal aorta is noted without aneurysm or  dissection. Celiac: Patent without evidence of aneurysm, dissection, vasculitis or significant stenosis. SMA: Moderate narrowing is noted at origin secondary to calcified plaque. Renals: Both renal arteries are patent without evidence of aneurysm, dissection, vasculitis, fibromuscular dysplasia or significant stenosis. IMA: Severe narrowing is noted at origin secondary to calcified plaque. Inflow: Patent without evidence of aneurysm, dissection, vasculitis or significant stenosis. Veins: No obvious venous abnormality within the limitations of this arterial phase study. Review of the MIP images confirms the above findings. NON-VASCULAR Hepatobiliary: Minimal cholelithiasis. No biliary dilatation. Liver is unremarkable. Pancreas: Unremarkable. No pancreatic ductal dilatation or surrounding inflammatory changes. Spleen: Normal in size without focal abnormality. Adrenals/Urinary Tract: Adrenal glands are unremarkable. Kidneys are normal, without renal calculi, focal lesion, or hydronephrosis. Bladder is unremarkable. Stomach/Bowel: Stomach is within normal limits. Appendix appears normal. No evidence of bowel wall thickening, distention, or inflammatory changes. Lymphatic: No adenopathy is noted. Reproductive: Prostate is unremarkable. Other: No abdominal wall hernia or abnormality. No abdominopelvic ascites. Musculoskeletal: No acute or significant osseous findings. Review of the MIP images confirms the above findings. IMPRESSION: 1. Status post aortic valve repair. No evidence of thoracic or abdominal aortic dissection or aneurysm. 2. Small right pleural effusion is noted with minimal adjacent subsegmental atelectasis. 3. Patchy airspace opacities are noted throughout both lungs most consistent with multifocal pneumonia. 4. Minimal cholelithiasis. 5. Aortic atherosclerosis. Aortic Atherosclerosis (ICD10-I70.0). Electronically Signed   By: Lynwood Landy Raddle M.D.   On: 05/16/2024 15:54   CT ANGIO CHEST AORTA W/CM & OR  WO/CM Result Date: 05/16/2024 CLINICAL DATA:  Preop for transcatheter aortic valve repair. EXAM: CT ANGIOGRAPHY CHEST, ABDOMEN AND PELVIS TECHNIQUE: Non-contrast CT of the chest was initially obtained. Multidetector CT imaging through the chest, abdomen and pelvis was performed using the standard protocol during bolus administration of intravenous contrast. Multiplanar reconstructed images and MIPs were obtained and reviewed to evaluate the vascular anatomy. RADIATION DOSE REDUCTION: This exam was performed according to the departmental dose-optimization program which includes automated exposure control, adjustment of the mA and/or kV according to patient size and/or use of iterative  reconstruction technique. CONTRAST:  OMNIPAQUE  IOHEXOL  350 MG/ML SOLN COMPARISON:  April 25, 2022. FINDINGS: CTA CHEST FINDINGS Cardiovascular: Status post aortic valve repair. There is no evidence of thoracic aortic dissection or aneurysm. Status post coronary bypass graft. Great vessels are widely patent without stenosis. Mediastinum/Nodes: Esophagus is unremarkable. Thyroid  gland is unremarkable. Calcified mediastinal lymph nodes are again noted suggesting prior granulomatous disease. Lungs/Pleura: Small right pleural effusion is noted with minimal adjacent subsegmental atelectasis. Patchy airspace opacities are noted throughout both lungs most consistent with multifocal pneumonia. Musculoskeletal: No chest wall abnormality. No acute or significant osseous findings. Review of the MIP images confirms the above findings. CTA ABDOMEN AND PELVIS FINDINGS VASCULAR Aorta: Atherosclerosis of abdominal aorta is noted without aneurysm or dissection. Celiac: Patent without evidence of aneurysm, dissection, vasculitis or significant stenosis. SMA: Moderate narrowing is noted at origin secondary to calcified plaque. Renals: Both renal arteries are patent without evidence of aneurysm, dissection, vasculitis, fibromuscular dysplasia or  significant stenosis. IMA: Severe narrowing is noted at origin secondary to calcified plaque. Inflow: Patent without evidence of aneurysm, dissection, vasculitis or significant stenosis. Veins: No obvious venous abnormality within the limitations of this arterial phase study. Review of the MIP images confirms the above findings. NON-VASCULAR Hepatobiliary: Minimal cholelithiasis. No biliary dilatation. Liver is unremarkable. Pancreas: Unremarkable. No pancreatic ductal dilatation or surrounding inflammatory changes. Spleen: Normal in size without focal abnormality. Adrenals/Urinary Tract: Adrenal glands are unremarkable. Kidneys are normal, without renal calculi, focal lesion, or hydronephrosis. Bladder is unremarkable. Stomach/Bowel: Stomach is within normal limits. Appendix appears normal. No evidence of bowel wall thickening, distention, or inflammatory changes. Lymphatic: No adenopathy is noted. Reproductive: Prostate is unremarkable. Other: No abdominal wall hernia or abnormality. No abdominopelvic ascites. Musculoskeletal: No acute or significant osseous findings. Review of the MIP images confirms the above findings. IMPRESSION: 1. Status post aortic valve repair. No evidence of thoracic or abdominal aortic dissection or aneurysm. 2. Small right pleural effusion is noted with minimal adjacent subsegmental atelectasis. 3. Patchy airspace opacities are noted throughout both lungs most consistent with multifocal pneumonia. 4. Minimal cholelithiasis. 5. Aortic atherosclerosis. Aortic Atherosclerosis (ICD10-I70.0). Electronically Signed   By: Lynwood Landy Raddle M.D.   On: 05/16/2024 15:54   ECHO TEE Result Date: 05/15/2024    TRANSESOPHOGEAL ECHO REPORT   Patient Name:   Kirk [name] Date of Exam: 05/15/2024 Medical Rec #:  [id number]      Height:       71.0 in Accession #:    [id number]     Weight:       218.1 lb Date of Birth:  [birth year]      BSA:          2.188 m Patient Age:    78 years       BP:            132/76 mmHg Patient Gender: M              HR:           69 bpm. Exam Location:  Inpatient Procedure: Transesophageal Echo, 3D Echo, Cardiac Doppler and Color Doppler            (Both Spectral and Color Flow Doppler were utilized during            procedure). Indications:     Mitral Stenosis i34.1  History:         Patient has prior history of Echocardiogram examinations, most  recent 05/14/2024. CHF, Prior CABG; Risk Factors:Hypertension                  and Diabetes.                  Aortic Valve: Magna bioprosthetic valve is present in the                  aortic position. Procedure Date: 08/16/2011.  Sonographer:     Damien Senior RDCS Referring Phys:  [id number] LEONTINE SAILOR LOCKWOOD Diagnosing Phys: Stanly Leavens MD PROCEDURE: After discussion of the risks and benefits of a TEE, an informed consent was obtained from the patient. TEE procedure time was 35 minutes. The transesophogeal probe was passed without difficulty through the esophogus of the patient. Imaged were obtained with the patient in a left lateral decubitus position. Sedation performed by different physician. The patient was monitored while under deep sedation. Anesthestetic sedation was provided intravenously by Anesthesiology: 396mg  of Propofol , 100mg  of Lidocaine . Supplementary images were obtained from transthoracic windows as indicated to answer the clinical question. The patient developed no complications during the procedure.  IMPRESSIONS  1. Left ventricular ejection fraction, by estimation, is 60 to 65%. The left ventricle has normal function.  2. Right ventricular systolic function is normal. The right ventricular size is normal.  3. No left atrial/left atrial appendage thrombus was detected.  4. The mitral valve is degenerative. Moderate to severe mitral valve regurgitation. Mild mitral stenosis. The mean mitral valve gradient is 3.0 mmHg with average heart rate of 60 bpm. Severe mitral annular calcification.  5. The aortic  valve has been replaced by a 25 mm Magna Ease. Aortic valve regurgitation is not visualized. Severe aortic valve stenosis. 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 Procedure Date: 08/16/2011. 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 Aortic valve area, by VTI measures 0.69 cm. Aortic valve mean gradient measures 53.5 mmHg. Aortic valve Vmax measures 4.79 m/s. Acceleration time 119 msec. Consider evaluation for prosthetic valve stenosis.  6. 3D performed of the mitral valve and demonstrates 3D assessment of Mitral Valve: There are three jets of mitral regurgiation at A1P1, A2P2, and A3,P3. Summation of 3D VCA 0.562 (severe) but with only systolic blunting of the pulmonary vein flow. Color flow more prominent at 123/78 mm Hg. 3D MV Area 3.58 cm2. The anterior annular calcification about A2 is protruding and would make mTEER challening, along with baseline MVA.  7. The inferior vena cava is dilated in size with >50% respiratory variability, suggesting right atrial pressure of 8 mmHg. FINDINGS  Left Ventricle: Left ventricular ejection fraction, by estimation, is 60 to 65%. The left ventricle has normal function. The left ventricular internal cavity size was normal in size. Right Ventricle: The right ventricular size is normal. No increase in right ventricular wall thickness. Right ventricular systolic function is normal. Left Atrium: Left atrial size was normal in size. No left atrial/left atrial appendage thrombus was detected. Right Atrium: Right atrial size was normal in size. Pericardium: There is no evidence of pericardial effusion. Mitral Valve: The mitral valve is degenerative in appearance. Severe mitral annular calcification. Moderate to severe mitral valve regurgitation. Mild mitral valve stenosis. MV peak gradient, 7.1 mmHg. The mean mitral valve gradient is 3.0 mmHg with average heart rate of 60 bpm. Tricuspid Valve: The tricuspid valve is normal in  structure. Tricuspid valve regurgitation is trivial. Aortic Valve: The aortic valve has been repaired/replaced. Aortic valve regurgitation  is not visualized. Severe aortic stenosis is present. Aortic valve mean gradient measures 53.5 mmHg. Aortic valve peak gradient measures 91.8 mmHg. Aortic valve area, by VTI measures 0.69 cm. 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 Procedure Date: 08/16/2011. Pulmonic Valve: The pulmonic valve was grossly normal. Pulmonic valve regurgitation is not visualized. Aorta: The aortic root and ascending aorta are structurally normal, with no evidence of dilitation. Venous: A systolic blunting flow pattern is recorded from the left upper pulmonary vein. The inferior vena cava is dilated in size with greater than 50% respiratory variability, suggesting right atrial pressure of 8 mmHg. IAS/Shunts: The interatrial septum appears to be lipomatous. No atrial level shunt detected by color flow Doppler. Additional Comments: 3D was performed not requiring image post processing on an independent workstation and was normal. Spectral Doppler performed. LEFT VENTRICLE PLAX 2D LVOT diam:     2.30 cm   Diastology LV SV:         79        LV e' medial:    6.28 cm/s LV SV Index:   36        LV E/e' medial:  22.8 LVOT Area:     4.15 cm  LV e' lateral:   6.07 cm/s                          LV E/e' lateral: 23.6  AORTIC VALVE AV Area (Vmax):    0.68 cm AV Area (Vmean):   0.75 cm AV Area (VTI):     0.69 cm AV Vmax:           479.00 cm/s AV Vmean:          342.500 cm/s AV VTI:            1.135 m AV Peak Grad:      91.8 mmHg AV Mean Grad:      53.5 mmHg LVOT Vmax:         78.20 cm/s LVOT Vmean:        61.500 cm/s LVOT VTI:          0.189 m LVOT/AV VTI ratio: 0.17 MITRAL VALVE MV Area (PHT): 2.55 cm     SHUNTS MV Area VTI:   2.16 cm     Systemic VTI:  0.19 m MV Peak grad:  7.1 mmHg     Systemic Diam: 2.30 cm MV Mean grad:  3.0 mmHg MV Vmax:       1.33 m/s MV Vmean:      74.2  cm/s MV Decel Time: 297 msec MV E velocity: 143.00 cm/s MV A velocity: 61.10 cm/s MV E/A ratio:  2.34 Stanly Leavens MD Electronically signed by Stanly Leavens MD Signature Date/Time: 05/15/2024/6:03:38 PM    Final    CARDIAC CATHETERIZATION Result Date: 05/15/2024 Images from the original result were not included. Cardiac Catheterization 05/15/24: Hemodynamic data: RA 23/22, mean 18 mmHg. RV 63/3, EDP 23 mmHg. PA 68/30, mean 43 mmHg. PW 33/36, mean 31 mmHg. PA saturation 53%, AO saturation 92%. QP/QS 1.00.  CO 3.43, CI 1.57, markedly reduced.  PAPi 2.1. Angiographic data: Left main nonexistent and has separate ostia for the LAD and CX immediately bifurcate. LAD gives origin to large D3.  The LAD is smooth and normal.  Ends at the apex. LCx: Large-caliber vessel, smooth and normal.  Gives origin to large OM 2-3.  Smooth and normal. RCA: Very large vessel, large PDA and  large PL branch, smooth and normal. LIMA to D3: Widely patent. Impression and recommendations: No coronary artery disease by cardiac catheterization.  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  PAPi preserved suggesting preserved RV function.  Significant volume excess still persist. Increased furosemide  to 40 mg twice daily, added Jardiance  10 mg daily.   EP STUDY Result Date: 05/15/2024 See surgical note for result.   Cardiac Studies   Patient Profile   78 y.o. male with bioprosthetic AVR, CAD status post CABG, T2DM, hypertension, DVT/PE presents with acute on chronic heart failure  Assessment & Plan   Acute on chronic diastolic heart failure: Echocardiogram 9/10 showed EF 70 to 75%, severe asymmetric LVH, low normal RV function, moderate left atrial enlargement, mean gradient 14 mmHg through bioprosthetic aortic valve.  TEE 9/11 showed severe bioprosthetic stenosis, mean gradient 54 mmHg, V-max 4.8 m/s, EOA 0.7 cm, EF 60 to 65%, normal  RV function, severe mitral regurgitation.  RHC/LHC on 9/11 showed no significant CAD, patent LIMA-D1,, RA 18, RV 63/3, PA 68/30/43, PCWP 31, PA sat 53%, CI 1.6. -Continue IV Lasix .  Excellent diuresis yesterday but will decrease lasix  dose as need to be cautious to avoid overdiuresis in setting of severe AS.  Recommend PICC for monitoring CVP/Co-ox -Strict I's/O's and daily weights -Hold beta-blocker with low output state -Tenuous situation with low output and severely elevated filling pressures on RHC in setting of severe bioprosthetic AS.  No current evidence of hypoperfusion, remains normotensive, normal renal function, mentating appropriately, diuresing well.  Does however feel cool on exam.  Will check lactate. Plan PICC placement as above to monitor CVP/Co-ox. Low threshold for inotropic support and Advanced Heart Failure consult if any evidence of hypoperfusion.    ADDENDUM: Lactate elevated at 2.6.  PICC placed this morning and shows Coox is significantly improved at 73% and CVP is down to 6.  Suspect lactate may be lagging behind as no other signs of hypoperfusion.  Will trend lactate.  He received IV Lasix  this morning, will hold off on additional IV Lasix  given need to avoid overdiuresis in setting of severe bioprosthetic aortic stenosis and CVP is normalized.  Appears to be improving but remains tenuous.  Discussed with advanced heart failure and Dr. Zenaida will round on patient tomorrow  Severe bioprosthetic aortic valve stenosis: Echocardiogram findings as above.  Was planned for valve in valve TAVR but unfortunately has CT shows high risk anatomy, with low valve to coronary distance he would be at risk of coronary obstruction.  Will discuss further with the structural heart team about options (?BASILICA)  CAD: Patent LIMA-D1 on LHC on 9/11.  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  Switched to heparin  drip while awaiting possible intervention   For questions or updates, please  contact [facility] Please consult [email or web address] for contact info under       Signed, Lonni LITTIE Nanas, MD  05/17/2024, 5:58 AM

## 2024-05-17 NOTE — Progress Notes (Signed)
 [facility]                                                                              Powell Halbert, is a 78 y.o. 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  Patient had come to the ER about 3 days ago with shortness of breath with cough and wheezing.  At that time patient was diagnosed with possible pneumonia and bronchitis was discharged home on antibiotics and prednisone .  Due to worsening shortness of breath patient presents back to the ER.  Denied any chest pain or abdominal pain.   ED Course: In the ER patient was acutely short of breath and had to be placed on BiPAP.  Chest x-ray shows features concerning for fluid overload was given Lasix  40 mg IV.  Troponin remained flat.  On exam patient was diffusely wheezing, was placed on nebulizer therapy.  COVID flu test were negative respiratory panel was  negative 2 days ago.   proBNP was 2400 troponins were 57 and 52.  WBC count was 16.1.  Assessment & Plan    Principal Problem:   Acute hypoxemic respiratory failure (HCC) Acute on chronic diastolic CHF - Initially placed on BiPAP in ED, improved after Lasix  IV 40 mg x 1 and nebulizer treatments - Multifactorial likely due to acute CHF versus acute bronchitis with PNA, history of PE, BNP 2462 - 2D echo showed EF 70 to 75%, severe asymmetric LVH of the basal septal segment, left ventricular diastolic function could not be evaluated, degenerative mitral valve, trivial MR, moderate mitral annular  calcification, repaired aortic valve, trivial AR, moderate AS. 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  Dr. Wendel evaluating today.  Acute bronchitis/pneumonia - Chest x-ray shows no dense consolidation, favors pulmonary edema - Continue IV Lasix , IV Rocephin , doxycycline   - No acute wheezing, continue albuterol  as needed   CAD status post CABG, HTN -Currently no chest pain, troponins flat, continue eliquis , beta-blocker, statin - 2D echo showed EF 70 to 75%, severe asymmetric LVH of the basal septal segment, left  ventricular diastolic function could not be evaluated, degenerative mitral valve, trivial MR, moderate mitral annular calcification, repaired aortic valve, trivial AR, moderate AS. - Cardiac cath with no coronary artery disease   History of saddle PE, on anticoagulation - On IV heparin  drip due to interventions  - Will resume Eliquis  once cleared by cardiology  Mild acute kidney injury - Improving  Diabetes mellitus type 2 - On Ozempic  and Farxiga .  Hemoglobin A1c 5.9 - For now continue sliding scale insulin  while inpatient CBG (last 3)  Recent Labs    05/16/24 2128 05/17/24 0529 05/17/24 1110  GLUCAP 120* 115* 164*     Hypothyroidism - Continue Synthroid   Diarrhea - C. difficile negative, GI  pathogen panel in process   Obesity class I Estimated body mass index is 29.02 kg/m as calculated from the following:   Height as of this encounter: 5' 11 (1.803 m).   Weight as of this encounter: 94.4 kg.  Code Status: Full code DVT Prophylaxis:     Level of Care: Level of care: Progressive Family Communication: Updated patient, briefly discussed with patient's wife on the phone, Dr Wendel, cardiology updated patient's wife regarding cardiac issues. Disposition Plan:      Remains inpatient appropriate:      Procedures:  2D echo TEE  Right and left heart cath   Consultants:   Cardiology  Antimicrobials:   Anti-infectives (From admission, onward)    Start     Dose/Rate Route Frequency Ordered Stop   05/16/24 2200  doxycycline  (VIBRAMYCIN ) 100 mg in sodium chloride  0.9 % 250 mL IVPB        100 mg 125 mL/hr over 120 Minutes Intravenous Every 12 hours 05/16/24 1616     05/14/24 1800  cefTRIAXone  (ROCEPHIN ) 2 g in sodium chloride  0.9 % 100 mL IVPB        2 g 200 mL/hr over 30 Minutes Intravenous Every 24 hours 05/14/24 0536     05/14/24 0630  doxycycline  (VIBRAMYCIN ) 100 mg in sodium chloride  0.9 % 250 mL IVPB  Status:  Discontinued        100 mg 125 mL/hr over 120 Minutes Intravenous Every 12 hours 05/14/24 0532 05/16/24 1616   05/13/24 1815  cefTRIAXone  (ROCEPHIN ) 1 g in sodium chloride  0.9 % 100 mL IVPB        1 g 200 mL/hr over 30 Minutes Intravenous  Once 05/13/24 1808 05/13/24 1926   05/13/24 1815  azithromycin  (ZITHROMAX ) 500 mg in sodium chloride  0.9 % 250 mL IVPB        500 mg 250 mL/hr over 60 Minutes Intravenous  Once 05/13/24 1808 05/13/24 2049          Medications  aspirin  EC  81 mg Oral Daily   Chlorhexidine  Gluconate Cloth  6 each Topical Daily   empagliflozin   10 mg Oral Daily   furosemide   40 mg Intravenous Daily   insulin  aspart  0-9 Units Subcutaneous TID WC   levothyroxine   50 mcg Oral QAC breakfast   mirabegron  ER  25 mg Oral Daily    rosuvastatin   40 mg Oral QHS   sodium chloride  flush  10-40 mL Intracatheter Q12H      Subjective:   Melven Stockard was seen and examined today.  Diarrhea resolved, sitting up in the chair, somewhat frustrated.  Objective:   Vitals:   05/17/24 0550 05/17/24 0759 05/17/24 1107 05/17/24 1109  BP: 135/67 130/66 (!) 83/70 128/70  Pulse: 65 62    Resp: 20 15  15   Temp:  97.6 F (36.4 C)    TempSrc:  Oral    SpO2: 93% 94%    Weight:      Height:        Intake/Output Summary (Last 24 hours) at 05/17/2024 1201 Last data filed at 05/17/2024 0408 Gross per 24 hour  Intake 505.84 ml  Output 3800 ml  Net -3294.16 ml     Wt Readings from Last 3 Encounters:  05/17/24 94.4 kg  04/07/24 99.2 kg  02/04/24 99.2 kg   Physical Exam General: Alert and oriented x 3, NAD Cardiovascular: S1 S2 clear, RRR. +JVD, 3/6 systolic murmur Respiratory: Diminished breath sounds Gastrointestinal: Soft, nontender, nondistended, NBS Ext: no pedal edema bilaterally Neuro: no new deficits Psych: Normal affect   Data Reviewed:  I have personally reviewed following labs    CBC Lab Results  Component Value Date   WBC 8.4 05/17/2024   RBC 5.73 05/17/2024   HGB 17.2 (H) 05/17/2024   HCT 54.5 (H) 05/17/2024   MCV 95.1 05/17/2024   MCH 30.0 05/17/2024   PLT 176 05/17/2024   MCHC 31.6 05/17/2024   RDW 15.5 05/17/2024   LYMPHSABS 0.8 05/14/2024   MONOABS 1.3 (H) 05/14/2024   EOSABS 0.1 05/14/2024   BASOSABS 0.0 05/14/2024     Last metabolic panel Lab Results  Component Value Date   NA 136 05/17/2024   K 5.5 (H) 05/17/2024   CL 96 (L) 05/17/2024   CO2 23 05/17/2024   BUN 32 (H) 05/17/2024   CREATININE 1.16 05/17/2024   GLUCOSE 112 (H) 05/17/2024   GFRNONAA >60 05/17/2024   GFRAA >60 01/08/2018   CALCIUM  9.5 05/17/2024   PHOS 4.6 05/17/2024   PROT 7.0 05/14/2024   ALBUMIN  3.7 05/17/2024   LABGLOB 2.8 08/21/2023   BILITOT 1.2 05/14/2024   ALKPHOS 61 05/14/2024   AST 35 05/14/2024    ALT 25 05/14/2024   ANIONGAP 17 (H) 05/17/2024    CBG (last 3)  Recent Labs    05/16/24 2128 05/17/24 0529 05/17/24 1110  GLUCAP 120* 115* 164*      Coagulation Profile: No results for input(s): INR, PROTIME in the last 168 hours.   Radiology Studies: I have personally reviewed the imaging studies  US  EKG SITE RITE Result Date: 05/17/2024 If Site Rite image not attached, placement could not be confirmed due to current cardiac rhythm.  CT CORONARY MORPH W/CTA COR W/SCORE W/CA W/CM &/OR WO/CM Addendum Date: 05/16/2024 ADDENDUM REPORT: 05/16/2024 16:50 EXAM: OVER-READ INTERPRETATION  CT CHEST The following report is an over-read performed by radiologist Dr. Lynwood [facility] on 05/16/2024. This over-read does not include interpretation of cardiac or coronary anatomy or pathology. The coronary CTA interpretation by the cardiologist is attached. COMPARISON:  None. FINDINGS: The visualized portions of the extracardiac vascular structures of the chest are unremarkable. Visualized mediastinum is unremarkable. Visualized portion of upper abdomen is unremarkable. Small right pleural effusion is noted with minimal adjacent subsegmental atelectasis. Patchy airspace opacities are seen in both upper lobes concerning for multifocal pneumonia. Visualized skeleton is unremarkable. IMPRESSION: Small right pleural effusion is noted with minimal adjacent subsegmental atelectasis. Patchy airspace opacities are seen in both upper lobes concerning for multifocal pneumonia. Electronically Signed   By: Lynwood Seip Raddle M.D.   On: 05/16/2024 16:50   Result Date: 05/16/2024 CLINICAL DATA:  Aortic Valve pathology with assessment for TAVR (prior SAVR) EXAM: Cardiac TAVR CT TECHNIQUE: The patient was scanned on a [employer]  192 slice scanner. A 120 kV retrospective scan was triggered in the descending thoracic aorta at 111 HU's. Gantry rotation speed was 270 msecs and collimation was .9 mm.  No beta blockade or nitro were given. The 3D data set was reconstructed in 5% intervals of the R-R cycle. Systolic and diastolic phases were analyzed on a dedicated work station using MPR, MIP and VRT modes. The patient received 100 cc of contrast. FINDINGS: Prosthetic Valve: 25 mm Edwards Magna Ease Pericardial Tissue Valve (implanted 08/16/2011). Hypoattenuation and degree: All three prosthetic leaflets are prominently calcified. In cine evaluation, the non and the left leaflet are significantly hypokinetic and restricted. Normal motion of the right leaflet. Mild annular hypoattenuation. Annular calcification: Severe- large protruding LVOT/subannular calcification. The intervalvular fibrosa is heavily calcified. Mitral Valve: The is anterior leaflet restriction that arises from large anterior annular calcification that extends to restriction the A2 leaflet. Mild posterior annular calcification. Presence of basal septal hypertrophy: Severe, 16 mm Perimembranous septal diameter: 4 mm Aortic Measurements- 65% phase due to artifact Sinotubular Junction: 29 mm. Adventitial thickening noted. This may be related to post-operative changes. Ascending Thoracic Aorta: 36 mm Descending Thoracic Aorta: 26 mm Optimum Fluoroscopic Angle for Delivery: LAO 5, CAU 4 Left Main Coronary angle: RAO 0, CAU 11 Valves for structural team consideration (with Valve in Valve App Support): 26 mm Sapien is favored. Left main virtual Valve to coronary distance: 4 mm Right coronary virtual Valve to coronary distance: 5 mm Evolut Left main virtual Valve to coronary distance: 3 mm Right coronary virtual Valve to coronary distance:4 mm In the setting of severe subannular calcification, study assumes a non-fractured valve strategy. Left main coronary height 7 mm. Left SOV height 14 mm. Estimated left prosthetic leaflet length 14 mm. Recommend heart team meeting for left coronary system deployment planning. Right coronary height 8 mm. Right SOV  height 15 mm. Non TAVR Valve Findings: Coronary Arteries: Normal coronary origin. Study not completed with nitroglycerin . Suboptimal coronary assessment due to motion. Suggests clinical correlation to 05/15/24 heart catheterization. Systemic veins: Normal caliber Main Pulmonary artery: Normal caliber Pulmonary veins: Normal anatomy. Left atrial appendage: Patent. Interatrial septum: Lipomatous interatrial septal hypertrophy noted without a left to right communication. Chamber dimensions: Left atrial dilation. Pericardium: No calcification. Extra Cardiac Findings as per separate reporting. Notable artifacts: Slab and motion artifact; this affect the diastolic assessment of the annulus. Image quality: Poor IMPRESSION: 1. Prosthetic aortic stenosis. Findings pertinent to TAVR procedure are detailed above. RECOMMENDATIONS: The proposed cut-off value of 1,651 AU yielded a 93 % sensitivity and 75 % specificity in grading AS severity in patients with classical low-flow, low-gradient AS. Proposed different cut-off values to define severe AS for men and women as 2,065 AU and 1,274 AU, respectively. The joint European and American recommendations for the assessment of AS consider the aortic valve calcium  score as a continuum - a very high calcium  score suggests severe AS and a low calcium  score suggests severe AS is unlikely. Donney VEAR Jarome LULLA Stephen RENETTE, et al. 2017 ESC/EACTS Guidelines for the management of valvular heart disease. Eur Heart J [phone] Coronary artery calcium  (CAC) score is a strong predictor of incident coronary heart disease (CHD) and provides predictive information beyond traditional risk factors. CAC scoring is reasonable to use in the decision to withhold, postpone, or initiate statin therapy in intermediate-risk or selected borderline-risk asymptomatic adults (age 76-75 years and LDL-C >=70 to <190 mg/dL) who do not have diabetes or established atherosclerotic cardiovascular disease (ASCVD).* In  intermediate-risk (10-year ASCVD risk >=7.5% to <20%) adults or selected borderline-risk (10-year ASCVD risk >=5% to <7.5%) adults in whom a CAC score is measured for the purpose of making a treatment decision the following recommendations have been made: If CAC = 0, it is reasonable to withhold statin therapy and reassess in 5 to 10 years, as long as higher risk conditions are absent (diabetes mellitus, family history of premature CHD in first degree relatives (males <55 years; females <65 years), cigarette smoking, LDL >=190 mg/dL or other independent risk factors). If CAC is 1 to 99, it is reasonable to initiate statin therapy for patients >=59 years of age. If CAC is >=100 or >=75th percentile, it is reasonable to initiate statin therapy at any age. Cardiology referral should be considered for patients with CAC scores >=400 or >=75th percentile. *2018 AHA/ACC/AACVPR/AAPA/ABC/ACPM/ADA/AGS/APhA/ASPC/NLA/PCNA Guideline on the Management of Blood Cholesterol: A Report of the American College of Cardiology/American Heart Association Task Force on Clinical Practice Guidelines. J Am Coll Cardiol. 2019;73(24):3168-3209. Mahesh  Chandrasekhar Electronically Signed: By: Stanly Leavens M.D. On: 05/16/2024 16:42   CT Angio Abd/Pel w/ and/or w/o Result Date: 05/16/2024 CLINICAL DATA:  Preop for transcatheter aortic valve repair. EXAM: CT ANGIOGRAPHY CHEST, ABDOMEN AND PELVIS TECHNIQUE: Non-contrast CT of the chest was initially obtained. Multidetector CT imaging through the chest, abdomen and pelvis was performed using the standard protocol during bolus administration of intravenous contrast. Multiplanar reconstructed images and MIPs were obtained and reviewed to evaluate the vascular anatomy. RADIATION DOSE REDUCTION: This exam was performed according to the departmental dose-optimization program which includes automated exposure control, adjustment of the mA and/or kV according to patient size and/or use of  iterative reconstruction technique. CONTRAST:  OMNIPAQUE  IOHEXOL  350 MG/ML SOLN COMPARISON:  April 25, 2022. FINDINGS: CTA CHEST FINDINGS Cardiovascular: Status post aortic valve repair. There is no evidence of thoracic aortic dissection or aneurysm. Status post coronary bypass graft. Great vessels are widely patent without stenosis. Mediastinum/Nodes: Esophagus is unremarkable. Thyroid  gland is unremarkable. Calcified mediastinal lymph nodes are again noted suggesting prior granulomatous disease. Lungs/Pleura: Small right pleural effusion is noted with minimal adjacent subsegmental atelectasis. Patchy airspace opacities are noted throughout both lungs most consistent with multifocal pneumonia. Musculoskeletal: No chest wall abnormality. No acute or significant osseous findings. Review of the MIP images confirms the above findings. CTA ABDOMEN AND PELVIS FINDINGS VASCULAR Aorta: Atherosclerosis of abdominal aorta is noted without aneurysm or dissection. Celiac: Patent without evidence of aneurysm, dissection, vasculitis or significant stenosis. SMA: Moderate narrowing is noted at origin secondary to calcified plaque. Renals: Both renal arteries are patent without evidence of aneurysm, dissection, vasculitis, fibromuscular dysplasia or significant stenosis. IMA: Severe narrowing is noted at origin secondary to calcified plaque. Inflow: Patent without evidence of aneurysm, dissection, vasculitis or significant stenosis. Veins: No obvious venous abnormality within the limitations of this arterial phase study. Review of the MIP images confirms the above findings. NON-VASCULAR Hepatobiliary: Minimal cholelithiasis. No biliary dilatation. Liver is unremarkable. Pancreas: Unremarkable. No pancreatic ductal dilatation or surrounding inflammatory changes. Spleen: Normal in size without focal abnormality. Adrenals/Urinary Tract: Adrenal glands are unremarkable. Kidneys are normal, without renal calculi, focal lesion,  or hydronephrosis. Bladder is unremarkable. Stomach/Bowel: Stomach is within normal limits. Appendix appears normal. No evidence of bowel wall thickening, distention, or inflammatory changes. Lymphatic: No adenopathy is noted. Reproductive: Prostate is unremarkable. Other: No abdominal wall hernia or abnormality. No abdominopelvic ascites. Musculoskeletal: No acute or significant osseous findings. Review of the MIP images confirms the  above findings. IMPRESSION: 1. Status post aortic valve repair. No evidence of thoracic or abdominal aortic dissection or aneurysm. 2. Small right pleural effusion is noted with minimal adjacent subsegmental atelectasis. 3. Patchy airspace opacities are noted throughout both lungs most consistent with multifocal pneumonia. 4. Minimal cholelithiasis. 5. Aortic atherosclerosis. Aortic Atherosclerosis (ICD10-I70.0). Electronically Signed   By: Lynwood Landy Raddle M.D.   On: 05/16/2024 15:54   CT ANGIO CHEST AORTA W/CM & OR WO/CM Result Date: 05/16/2024 CLINICAL DATA:  Preop for transcatheter aortic valve repair. EXAM: CT ANGIOGRAPHY CHEST, ABDOMEN AND PELVIS TECHNIQUE: Non-contrast CT of the chest was initially obtained. Multidetector CT imaging through the chest, abdomen and pelvis was performed using the standard protocol during bolus administration of intravenous contrast. Multiplanar reconstructed images and MIPs were obtained and reviewed to evaluate the vascular anatomy. RADIATION DOSE REDUCTION: This exam was performed according to the departmental dose-optimization program which includes automated exposure control, adjustment of the mA and/or kV according to patient size and/or use of iterative reconstruction technique. CONTRAST:  OMNIPAQUE  IOHEXOL  350 MG/ML SOLN COMPARISON:  April 25, 2022. FINDINGS: CTA CHEST FINDINGS Cardiovascular: Status post aortic valve repair. There is no evidence of thoracic aortic dissection or aneurysm. Status post coronary bypass graft. Great  vessels are widely patent without stenosis. Mediastinum/Nodes: Esophagus is unremarkable. Thyroid  gland is unremarkable. Calcified mediastinal lymph nodes are again noted suggesting prior granulomatous disease. Lungs/Pleura: Small right pleural effusion is noted with minimal adjacent subsegmental atelectasis. Patchy airspace opacities are noted throughout both lungs most consistent with multifocal pneumonia. Musculoskeletal: No chest wall abnormality. No acute or significant osseous findings. Review of the MIP images confirms the above findings. CTA ABDOMEN AND PELVIS FINDINGS VASCULAR Aorta: Atherosclerosis of abdominal aorta is noted without aneurysm or dissection. Celiac: Patent without evidence of aneurysm, dissection, vasculitis or significant stenosis. SMA: Moderate narrowing is noted at origin secondary to calcified plaque. Renals: Both renal arteries are patent without evidence of aneurysm, dissection, vasculitis, fibromuscular dysplasia or significant stenosis. IMA: Severe narrowing is noted at origin secondary to calcified plaque. Inflow: Patent without evidence of aneurysm, dissection, vasculitis or significant stenosis. Veins: No obvious venous abnormality within the limitations of this arterial phase study. Review of the MIP images confirms the above findings. NON-VASCULAR Hepatobiliary: Minimal cholelithiasis. No biliary dilatation. Liver is unremarkable. Pancreas: Unremarkable. No pancreatic ductal dilatation or surrounding inflammatory changes. Spleen: Normal in size without focal abnormality. Adrenals/Urinary Tract: Adrenal glands are unremarkable. Kidneys are normal, without renal calculi, focal lesion, or hydronephrosis. Bladder is unremarkable. Stomach/Bowel: Stomach is within normal limits. Appendix appears normal. No evidence of bowel wall thickening, distention, or inflammatory changes. Lymphatic: No adenopathy is noted. Reproductive: Prostate is unremarkable. Other: No abdominal wall hernia  or abnormality. No abdominopelvic ascites. Musculoskeletal: No acute or significant osseous findings. Review of the MIP images confirms the above findings. IMPRESSION: 1. Status post aortic valve repair. No evidence of thoracic or abdominal aortic dissection or aneurysm. 2. Small right pleural effusion is noted with minimal adjacent subsegmental atelectasis. 3. Patchy airspace opacities are noted throughout both lungs most consistent with multifocal pneumonia. 4. Minimal cholelithiasis. 5. Aortic atherosclerosis. Aortic Atherosclerosis (ICD10-I70.0). Electronically Signed   By: Lynwood Landy Raddle M.D.   On: 05/16/2024 15:54   CARDIAC CATHETERIZATION Result Date: 05/15/2024 Images from the original result were not included. Cardiac Catheterization 05/15/24: Hemodynamic data: RA 23/22, mean 18 mmHg. RV 63/3, EDP 23 mmHg. PA 68/30, mean 43 mmHg. PW 33/36, mean 31 mmHg. PA saturation 53%, AO saturation 92%.  QP/QS 1.00.  CO 3.43, CI 1.57, markedly reduced.  PAPi 2.1. Angiographic data: Left main nonexistent and has separate ostia for the LAD and CX immediately bifurcate. LAD gives origin to large D3.  The LAD is smooth and normal.  Ends at the apex. LCx: Large-caliber vessel, smooth and normal.  Gives origin to large OM 2-3.  Smooth and normal. RCA: Very large vessel, large PDA and large PL branch, smooth and normal. LIMA to D3: Widely patent. Impression and recommendations: No coronary artery disease by cardiac catheterization.  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  PAPi preserved suggesting preserved RV function.  Significant volume excess still persist. Increased furosemide  to 40 mg twice daily, added Jardiance  10 mg daily.       Nydia Distance M.D. [facility] 05/17/2024, 12:01 PM  Available via Epic secure chat 7am-7pm After 7 pm, please refer to night coverage provider listed on amion.

## 2024-05-17 NOTE — Progress Notes (Signed)
 PHARMACY - ANTICOAGULATION CONSULT NOTE  Pharmacy Consult for heparin   Indication: history of PE/DVT  Allergies  Allergen Reactions   Cat Dander Shortness Of Breath and Swelling   Hydrocodone  Other (See Comments)    Seizures    Atorvastatin  Other (See Comments)    Joint pain   Dust Mite Extract Other (See Comments) and Swelling   Metformin Other (See Comments)    Diarrhea, muscle cramps   Oxycodone  Other (See Comments)    Seizure PATIENT DOES NOT WANT ANY NARCOTICS   Semaglutide (0.25 Or 0.5mg -Dos) Nausea Only    AT HIGHER DOSE    Patient Measurements: Height: 5' 11 (180.3 cm) Weight: 94.4 kg (208 lb 1.6 oz) IBW/kg (Calculated) : 75.3 HEPARIN  DW (KG): 96.4  Vital Signs: Temp: 97.8 F (36.6 C) (09/13 0005) Temp Source: Oral (09/13 0005) BP: 135/67 (09/13 0550) Pulse Rate: 65 (09/13 0550)  Labs: Recent Labs    05/15/24 0640 05/15/24 1358 05/15/24 1621 05/15/24 1626 05/16/24 0313  HGB 16.0  --  16.3  16.3 16.3 16.1  HCT 50.1  --  48.0  48.0 48.0 50.6  PLT 196  --   --   --  155  APTT 84* 83*  --   --   --   HEPARINUNFRC 1.06*  --   --   --   --   CREATININE 1.18  --   --   --  1.20    Estimated Creatinine Clearance: 61.4 mL/min (by C-G formula based on SCr of 1.2 mg/dL).   Medical History: Past Medical History:  Diagnosis Date   Allergy    Anal cancer (HCC) 04/2012   Aortic stenosis    valve replacement   Arthritis    1995- cerv. 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   CHF (congestive heart failure) (HCC)    Coronary artery disease    aortic stenosis, CAD   Diabetes mellitus    Type 2   DVT (deep venous thrombosis) (HCC)    ED (erectile dysfunction)    Elevated cholesterol    Heart murmur    no problems   History of kidney stones    passed, lithrotrispy and surgery   History of radiation  therapy 05/20/12-06/28/12   anal cancer=54gy total dose   Hypertension    Hypothyroidism    Kidney stones    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   Malignant neoplasm of other sites of rectum, rectosigmoid junction, and anus 04/12/2012   Myocardial infarction (HCC) 2012   Personal history of colonic polyps 03/20/2012   Pneumonia    Pulmonary embolism (HCC)    Rectal bleeding    Rectal mass 03/20/2012   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   05/13/2024 Morning   benzonatate  (TESSALON ) 100 MG capsule Take 1 capsule (100 mg total) by mouth every 8 (eight) hours. 21 capsule 0 05/13/2024 Morning   DULoxetine  (CYMBALTA ) 60 MG capsule Take 1 capsule by mouth at bedtime 90 capsule 0 05/12/2024 Bedtime   FARXIGA  5 MG TABS tablet Take 1 tablet by mouth once daily 90 tablet 0 05/13/2024 Morning   levothyroxine  (SYNTHROID ) 50 MCG tablet Take 1 tablet (50 mcg total) by mouth daily before breakfast. 90 tablet 1 05/13/2024 Morning   losartan  (COZAAR ) 50 MG tablet Take 1 tablet by mouth once daily 90 tablet 0 05/13/2024 Morning   metoprolol  tartrate (LOPRESSOR ) 25 MG tablet Take 1 tablet by mouth twice daily 180 tablet 0 05/13/2024 Morning   MYRBETRIQ  25 MG TB24 tablet Take 1 tablet (25 mg total) by mouth daily. 90 tablet 3 05/13/2024 Morning   OZEMPIC , 0.25 OR 0.5 MG/DOSE, 2 MG/3ML SOPN INJECT 0.25MG   SUBCUTANEOUSLY ONCE A WEEK 3 mL 0 Past Week   predniSONE   (DELTASONE ) 20 MG tablet 3 tabs po day one, then 2 tabs daily x 4 days 11 tablet 0 05/13/2024 Morning   rosuvastatin  (CRESTOR ) 40 MG tablet TAKE 1 TABLET BY MOUTH AT BEDTIME 90 tablet 0 05/12/2024 Bedtime   tadalafil  (CIALIS ) 20 MG tablet Take 20 mg by mouth daily as needed for erectile dysfunction.   Taking As Needed   triamcinolone  cream (KENALOG ) 0.1 % APPLY CREAM EXTERNALLY TO AFFECTED AREA TWICE DAILY AS NEEDED FOR ITCHING   Taking   Vitamins-Lipotropics (LIPO-FLAVONOID PLUS) TABS Take 1 tablet by mouth daily.   05/12/2024   zinc sulfate, 50mg  elemental zinc, 220 (50 Zn) MG capsule Take 220 mg by mouth daily.   05/12/2024 Evening   HYDROmorphone  (DILAUDID ) 2 MG tablet Take 0.5-1 tablets (1-2 mg total) by mouth every 8 (eight) hours as needed for severe pain (pain score 7-10). (Patient not taking: Reported on 05/13/2024) 21 tablet 0 Not Taking   methocarbamol  (ROBAXIN ) 500 MG tablet Take 1 tablet (500 mg total) by mouth every 6 (six) hours as needed for muscle spasms. (Patient not taking: Reported on 05/13/2024) 40 tablet 0 Not Taking   NEEDLE, DISP, 22 G 22G X 1-1/2 MISC 1 each by Does not apply route once a week. Use to administer medication. 50 each 1    ondansetron  (ZOFRAN ) 4 MG tablet Take 1 tablet (4 mg total) by mouth every 6 (six) hours as needed for nausea. (Patient not taking: Reported on 05/13/2024) 20 tablet 0 Not Taking   traMADol  (ULTRAM ) 50 MG tablet Take 1-2 tablets (50-100 mg total) by mouth every 6 (six) hours as needed for moderate pain (pain score 4-6). (Patient not taking: Reported on 05/13/2024) 40 tablet 0 Not Taking   Scheduled:   aspirin  EC  81 mg Oral Daily   empagliflozin   10 mg Oral Daily   furosemide   40 mg Intravenous Daily   insulin  aspart  0-9 Units Subcutaneous TID WC   levothyroxine   50 mcg Oral QAC breakfast   mirabegron  ER  25 mg Oral Daily   rosuvastatin   40 mg Oral QHS    Assessment: 78 yo male here with HF. He is on apixaban  PTA for history of PE/DVT in 2023 (and  history of squamous cell carcinoma).   He is now s/p cath with no CAD and RHC reveals significant volume. Possible surgical intervention with CVTS on Tuesday, 9/16. Started back on heparin  9/12 in  anticipation of surgery. Last apixaban  dose 9/12 @ 0915.  9/13 AM update: -Heparin  level supratherapeutic at 0.77 on 1550 units/hr -aPTT therapeutic at 90 on 1550 units/hr (will base recommendations off aPTT as the apixaban  is still affecting the heparin  level) -No signs of bleeding or pauses in infusion per RN -Hgb 17.2 (increased from 16.1), PLT WNL  Goal of Therapy:  Heparin  level 0.3-0.7 units/ml aPTT 66-102 seconds Monitor platelets by anticoagulation protocol: Yes   Plan:  -Continue heparin  1550 units/hr -Check aPTT and heparin  level in 8 hours and daily until correlated -Monitor Hgb, PLT and signs/sx bleeding  Izetta Carl, PharmD PGY1 Pharmacy Resident [facility]  05/17/2024 8:03 AM

## 2024-05-18 DIAGNOSIS — I272 Pulmonary hypertension, unspecified: Secondary | ICD-10-CM

## 2024-05-18 DIAGNOSIS — I5021 Acute systolic (congestive) heart failure: Secondary | ICD-10-CM | POA: Diagnosis not present

## 2024-05-18 DIAGNOSIS — I509 Heart failure, unspecified: Secondary | ICD-10-CM | POA: Diagnosis not present

## 2024-05-18 DIAGNOSIS — J9601 Acute respiratory failure with hypoxia: Secondary | ICD-10-CM | POA: Diagnosis not present

## 2024-05-18 DIAGNOSIS — I2602 Saddle embolus of pulmonary artery with acute cor pulmonale: Secondary | ICD-10-CM

## 2024-05-18 LAB — CBC
HCT: 49.3 % (ref 39.0–52.0)
Hemoglobin: 16 g/dL (ref 13.0–17.0)
MCH: 30 pg (ref 26.0–34.0)
MCHC: 32.5 g/dL (ref 30.0–36.0)
MCV: 92.5 fL (ref 80.0–100.0)
Platelets: 166 K/uL (ref 150–400)
RBC: 5.33 MIL/uL (ref 4.22–5.81)
RDW: 15.3 % (ref 11.5–15.5)
WBC: 8.2 K/uL (ref 4.0–10.5)
nRBC: 0 % (ref 0.0–0.2)

## 2024-05-18 LAB — GLUCOSE, CAPILLARY
Glucose-Capillary: 108 mg/dL — ABNORMAL HIGH (ref 70–99)
Glucose-Capillary: 178 mg/dL — ABNORMAL HIGH (ref 70–99)
Glucose-Capillary: 118 mg/dL — ABNORMAL HIGH (ref 70–99)
Glucose-Capillary: 141 mg/dL — ABNORMAL HIGH (ref 70–99)

## 2024-05-18 LAB — COOXEMETRY PANEL
Carboxyhemoglobin: 1.6 % — ABNORMAL HIGH (ref 0.5–1.5)
Methemoglobin: <0.7 % (ref 0.0–1.5)
O2 Saturation: 65.6 %
Total hemoglobin: 16.4 g/dL — ABNORMAL HIGH (ref 12.0–16.0)

## 2024-05-18 LAB — RENAL FUNCTION PANEL
Albumin: 3.4 g/dL — ABNORMAL LOW (ref 3.5–5.0)
Anion gap: 14 (ref 5–15)
BUN: 25 mg/dL — ABNORMAL HIGH (ref 8–23)
CO2: 25 mmol/L (ref 22–32)
Calcium: 9.1 mg/dL (ref 8.9–10.3)
Chloride: 98 mmol/L (ref 98–111)
Creatinine, Ser: 1.14 mg/dL (ref 0.61–1.24)
GFR, Estimated: >60 mL/min (ref >60)
Glucose, Bld: 119 mg/dL — ABNORMAL HIGH (ref 70–99)
Phosphorus: 3.9 mg/dL (ref 2.5–4.6)
Potassium: 3.9 mmol/L (ref 3.5–5.1)
Sodium: 137 mmol/L (ref 135–145)

## 2024-05-18 LAB — APTT: aPTT: 96 s — ABNORMAL HIGH (ref 24–36)

## 2024-05-18 LAB — HEPARIN LEVEL (UNFRACTIONATED): Heparin Unfractionated: 0.67 [IU]/mL (ref 0.30–0.70)

## 2024-05-18 LAB — LACTIC ACID, PLASMA: Lactic Acid, Venous: 1 mmol/L (ref 0.5–1.9)

## 2024-05-18 NOTE — Consult Note (Signed)
 Advanced Heart Failure Team Consult Note   Primary Physician: [name], Kirk PARAS, MD Cardiologist:  Kirk Parchment, MD  Reason for Consultation: aortic stenosis  HPI:    Kirk [name] is seen today for evaluation of acute on chronic diastolic heart failure in the setting of severe aortic stenosis of a bioprosthetic aortic valve at the request of Kirk [name].   Patient underwent surgical AVR in 2012 with LIMA-D1 and 25mm [facility] ease valve replacement. Did fairly well, increase in gradient on his last echo in 09/2023 but overall fairly well preserved. Presented in 05/2024 with acute hypoxic respiratory failure, became hypotensive with IV diuresis and then underwent TEE showing severe bioprosthetic AS with mean gradient 53.5, Vmax 4.79 and AVA 0.69. Subsequent right heart catheterization with severely elevated filling pressures and reduced cardiac index by assumed Fick. He has been diuresed to good effect. Has not been up and moving around, but otherwise is feeling well.     Objective:    Vital Signs:   Temp:  [97.5 F (36.4 C)-98.2 F (36.8 C)] 98.2 F (36.8 C) (09/14 0811) Pulse Rate:  [72-100] 88 (09/14 1115) Resp:  [15-20] 16 (09/14 1115) BP: (119-149)/(63-97) 130/97 (09/14 1115) SpO2:  [94 %-97 %] 95 % (09/14 0811) Weight:  [94.3 kg] 94.3 kg (09/14 0307) Last BM Date : 05/17/24  Weight change: Filed Weights   05/16/24 0600 05/17/24 0533 05/18/24 0307  Weight: 95.6 kg 94.4 kg 94.3 kg    Intake/Output:   Intake/Output Summary (Last 24 hours) at 05/18/2024 1319 Last data filed at 05/18/2024 0929 Gross per 24 hour  Intake 1630.15 ml  Output 1950 ml  Net -319.85 ml      Physical Exam    GENERAL: NAD, well appearing PULM:  Normal work of breathing, CTAB CARDIAC:  JVP: flat, CVP 3-4         Normal rate with regular rhythm. Systolic murmur, loss of S2, holosystolic murmur at the apex,   no LE edema. Warm and well perfused extremities. ABDOMEN: Soft, non-tender,  non-distended. NEUROLOGIC: Patient is oriented x3 with no focal or lateralizing neurologic deficits.    Telemetry   Sinus rhythm    Medications:     Current Medications:  aspirin  EC  81 mg Oral Daily   Chlorhexidine  Gluconate Cloth  6 each Topical Daily   empagliflozin   10 mg Oral Daily   furosemide   40 mg Intravenous Daily   insulin  aspart  0-9 Units Subcutaneous TID WC   levothyroxine   50 mcg Oral QAC breakfast   mirabegron  ER  25 mg Oral Daily   rosuvastatin   40 mg Oral QHS   sodium chloride  flush  10-40 mL Intracatheter Q12H    Infusions:  cefTRIAXone  (ROCEPHIN )  IV Stopped (05/17/24 1946)   doxycycline  (VIBRAMYCIN ) IV 100 mg (05/18/24 0929)   heparin  1,550 Units/hr (05/18/24 0300)      Patient Profile   Kirk [name] is seen today for evaluation of acute on chronic diastolic heart failure in the setting of severe aortic stenosis of a bioprosthetic aortic valve at the request of Kirk [name].  Assessment/Plan   Severe aortic valve stenosis: Previous surgical valve in 2012. Based on CTA will need coronary leaflet modification prior to procedure. Appears well compensated after excellent care, suspect he can be discharged with close follow up. - Likely follow up in [facility] clinic for leaflet modification and TAVR - Low threshold for repeat RHC and intervention during this admission if worsening  Acute  on chronic diastolic HF Pulmonary hypertension, WHO Group II In the setting of severe valvular disease, appears improved from a HF standpoint. If ambulating well without significant symptoms can hold off on repeat RHC. - Transition to oral diuretics tomorrow - Continue jardiance  10mg  daily - IV lasix  40mg  x1 today - Kcl fine, can hold off on MRA at this time  Mitral regurgitation:  - Moderate to severe, given MAC is a poor clip candidate - Suspect will improve after management of aortic valve disease  CAD - Mild disease, nonobstructive, prior LIMA-D1  PE:  -  Prior history, on heparin  gtt - If looks good tomorrow, can restart eliquis    Medication concerns reviewed with patient and pharmacy team. Barriers identified: Anticoagulation  Length of Stay: 5  Kirk JINNY Brownie, MD  05/18/2024, 1:19 PM    Advanced Heart Failure Team Pager [phone] (M-F; 7a - 5p)  Please contact [facility] for night-coverage after hours (4p -7a ) and weekends on [email or web address]

## 2024-05-18 NOTE — Progress Notes (Signed)
 PHARMACY - ANTICOAGULATION CONSULT NOTE  Pharmacy Consult for heparin   Indication: history of PE/DVT  Allergies  Allergen Reactions   Cat Dander Shortness Of Breath and Swelling   Hydrocodone  Other (See Comments)    Seizures    Atorvastatin  Other (See Comments)    Joint pain   Dust Mite Extract Other (See Comments) and Swelling   Metformin Other (See Comments)    Diarrhea, muscle cramps   Oxycodone  Other (See Comments)    Seizure PATIENT DOES NOT WANT ANY NARCOTICS   Semaglutide (0.25 Or 0.5mg -Dos) Nausea Only    AT HIGHER DOSE    Patient Measurements: Height: 5' 11 (180.3 cm) Weight: 94.3 kg (207 lb 14.4 oz) IBW/kg (Calculated) : 75.3 HEPARIN  DW (KG): 96.4  Vital Signs: Temp: 98.2 F (36.8 C) (09/14 0811) Temp Source: Oral (09/14 0811) BP: 130/97 (09/14 1115) Pulse Rate: 88 (09/14 1115)  Labs: Recent Labs    05/15/24 1358 05/15/24 1621 05/16/24 0313 05/17/24 0903 05/17/24 0943 05/18/24 0541  HGB  --    < > 16.1 17.2*  --  16.0  HCT  --    < > 50.6 54.5*  --  49.3  PLT  --   --  155 176  --  166  APTT 83*  --   --  90*  --  96*  HEPARINUNFRC  --   --   --   --  0.77* 0.67  CREATININE  --   --  1.20 1.16  --  1.14   < > = values in this interval not displayed.    Estimated Creatinine Clearance: 64.6 mL/min (by C-G formula based on SCr of 1.14 mg/dL).   Medical History: Past Medical History:  Diagnosis Date   Allergy    Anal cancer (HCC) 04/2012   Aortic stenosis    valve replacement   Arthritis    1995- cerv. 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   CHF (congestive heart failure) (HCC)    Coronary artery disease    aortic stenosis, CAD   Diabetes mellitus    Type 2   DVT (deep venous thrombosis) (HCC)    ED (erectile dysfunction)    Elevated cholesterol    Heart murmur    no problems    History of kidney stones    passed, lithrotrispy and surgery   History of radiation therapy 05/20/12-06/28/12   anal cancer=54gy total dose   Hypertension    Hypothyroidism    Kidney stones    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   Malignant neoplasm of other sites of rectum, rectosigmoid junction, and anus 04/12/2012   Myocardial infarction (HCC) 2012   Personal history of colonic polyps 03/20/2012   Pneumonia    Pulmonary embolism (HCC)    Rectal bleeding    Rectal mass 03/20/2012   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   05/13/2024 Morning   benzonatate  (TESSALON ) 100 MG capsule Take 1 capsule (100 mg total) by mouth every 8 (eight) hours. 21 capsule 0 05/13/2024 Morning   DULoxetine  (CYMBALTA ) 60 MG capsule Take 1 capsule by mouth at bedtime 90 capsule 0 05/12/2024 Bedtime   FARXIGA  5 MG TABS tablet Take 1 tablet by mouth once daily 90 tablet 0 05/13/2024 Morning   levothyroxine  (SYNTHROID ) 50 MCG tablet Take 1 tablet (50 mcg total) by mouth daily before breakfast. 90 tablet 1 05/13/2024 Morning   losartan  (COZAAR ) 50 MG tablet Take 1 tablet by mouth once daily 90 tablet 0 05/13/2024 Morning   metoprolol  tartrate (LOPRESSOR ) 25 MG tablet Take 1 tablet by mouth twice daily 180 tablet 0 05/13/2024 Morning   MYRBETRIQ  25 MG TB24 tablet Take 1 tablet (25 mg total) by mouth daily. 90 tablet 3 05/13/2024 Morning   OZEMPIC , 0.25 OR 0.5 MG/DOSE, 2 MG/3ML  SOPN INJECT 0.25MG   SUBCUTANEOUSLY ONCE A WEEK 3 mL 0 Past Week   predniSONE  (DELTASONE ) 20 MG tablet 3 tabs po day one, then 2 tabs daily x 4 days 11 tablet 0 05/13/2024 Morning   rosuvastatin  (CRESTOR ) 40 MG tablet TAKE 1 TABLET BY MOUTH AT BEDTIME 90 tablet 0 05/12/2024 Bedtime   tadalafil  (CIALIS ) 20 MG tablet Take 20 mg by mouth daily as needed for erectile dysfunction.   Taking As Needed   triamcinolone  cream (KENALOG ) 0.1 % APPLY CREAM EXTERNALLY TO AFFECTED AREA TWICE DAILY AS NEEDED FOR ITCHING   Taking   Vitamins-Lipotropics (LIPO-FLAVONOID PLUS) TABS Take 1 tablet by mouth daily.   05/12/2024   zinc sulfate, 50mg  elemental zinc, 220 (50 Zn) MG capsule Take 220 mg by mouth daily.   05/12/2024 Evening   HYDROmorphone  (DILAUDID ) 2 MG tablet Take 0.5-1 tablets (1-2 mg total) by mouth every 8 (eight) hours as needed for severe pain (pain score 7-10). (Patient not taking: Reported on 05/13/2024) 21 tablet 0 Not Taking   methocarbamol  (ROBAXIN ) 500 MG tablet Take 1 tablet (500 mg total) by mouth every 6 (six) hours as needed for muscle spasms. (Patient not taking: Reported on 05/13/2024) 40 tablet 0 Not Taking   NEEDLE, DISP, 22 G 22G X 1-1/2 MISC 1 each by Does not apply route once a week. Use to administer medication. 50 each 1    ondansetron  (ZOFRAN ) 4 MG tablet Take 1 tablet (4 mg total) by mouth every 6 (six) hours as needed for nausea. (Patient not taking: Reported on 05/13/2024) 20 tablet 0 Not Taking   traMADol  (ULTRAM ) 50 MG tablet Take 1-2 tablets (50-100 mg total) by mouth every 6 (six) hours as needed for moderate pain (pain score 4-6). (Patient not taking: Reported on 05/13/2024) 40 tablet 0 Not Taking   Scheduled:   aspirin  EC  81 mg Oral Daily   Chlorhexidine  Gluconate Cloth  6 each Topical Daily   empagliflozin   10 mg Oral Daily   furosemide   40 mg Intravenous Daily   insulin  aspart  0-9 Units Subcutaneous TID WC   levothyroxine   50 mcg Oral QAC breakfast   mirabegron  ER  25 mg Oral Daily    rosuvastatin   40 mg Oral QHS   sodium chloride  flush  10-40 mL Intracatheter Q12H    Assessment: 78 yo male here with HF. He  is on apixaban  PTA for history of PE/DVT in 2023 (and history of squamous cell carcinoma).   He is now s/p cath with no CAD and RHC reveals significant volume. Possible surgical intervention with CVTS on Tuesday, 9/16. Started back on heparin  9/12 in anticipation of surgery. Last apixaban  dose 9/12 @ 0915.  9/14 AM update: -Heparin  level therapeutic at 0.67 on 1550 units/hr -aPTT therapeutic at 96 on 1550 units/hr (will stop following aPTT as it has correlated with heparin  level) -No signs of bleeding or pauses in infusion per RN -Hgb, PLT WNL  Goal of Therapy:  Heparin  level 0.3-0.7 units/ml aPTT 66-102 seconds Monitor platelets by anticoagulation protocol: Yes   Plan:  -Continue heparin  1550 units/hr -Monitor daily heparin  level -Monitor Hgb, PLT and signs/sx bleeding  Izetta Carl, PharmD PGY1 Pharmacy Resident [facility]  05/18/2024 12:05 PM

## 2024-05-18 NOTE — Plan of Care (Signed)
  Problem: Education: Goal: Knowledge of General Education information will improve Description: Including pain rating scale, medication(s)/side effects and non-pharmacologic comfort measures Outcome: Progressing   Problem: Health Behavior/Discharge Planning: Goal: Ability to manage health-related needs will improve Outcome: Progressing   Problem: Clinical Measurements: Goal: Ability to maintain clinical measurements within normal limits will improve Outcome: Progressing Goal: Will remain free from infection Outcome: Progressing Goal: Diagnostic test results will improve Outcome: Progressing Goal: Respiratory complications will improve Outcome: Progressing Goal: Cardiovascular complication will be avoided Outcome: Progressing   Problem: Activity: Goal: Risk for activity intolerance will decrease Outcome: Progressing   Problem: Nutrition: Goal: Adequate nutrition will be maintained Outcome: Progressing   Problem: Coping: Goal: Level of anxiety will decrease Outcome: Progressing   Problem: Elimination: Goal: Will not experience complications related to bowel motility Outcome: Progressing Goal: Will not experience complications related to urinary retention Outcome: Progressing   Problem: Pain Managment: Goal: General experience of comfort will improve and/or be controlled Outcome: Progressing   Problem: Safety: Goal: Ability to remain free from injury will improve Outcome: Progressing   Problem: Skin Integrity: Goal: Risk for impaired skin integrity will decrease Outcome: Progressing   Problem: Education: Goal: Ability to describe self-care measures that may prevent or decrease complications (Diabetes Survival Skills Education) will improve Outcome: Progressing   Problem: Coping: Goal: Ability to adjust to condition or change in health will improve Outcome: Progressing   Problem: Fluid Volume: Goal: Ability to maintain a balanced intake and output will  improve Outcome: Progressing   Problem: Health Behavior/Discharge Planning: Goal: Ability to identify and utilize available resources and services will improve Outcome: Progressing Goal: Ability to manage health-related needs will improve Outcome: Progressing   Problem: Metabolic: Goal: Ability to maintain appropriate glucose levels will improve Outcome: Progressing   Problem: Nutritional: Goal: Maintenance of adequate nutrition will improve Outcome: Progressing Goal: Progress toward achieving an optimal weight will improve Outcome: Progressing   Problem: Skin Integrity: Goal: Risk for impaired skin integrity will decrease Outcome: Progressing   Problem: Tissue Perfusion: Goal: Adequacy of tissue perfusion will improve Outcome: Progressing   Problem: Education: Goal: Understanding of CV disease, CV risk reduction, and recovery process will improve Outcome: Progressing Goal: Individualized Educational Video(s) Outcome: Progressing   Problem: Activity: Goal: Ability to return to baseline activity level will improve Outcome: Progressing   Problem: Cardiovascular: Goal: Ability to achieve and maintain adequate cardiovascular perfusion will improve Outcome: Progressing Goal: Vascular access site(s) Level 0-1 will be maintained Outcome: Progressing   Problem: Health Behavior/Discharge Planning: Goal: Ability to safely manage health-related needs after discharge will improve Outcome: Progressing

## 2024-05-18 NOTE — Progress Notes (Signed)
 [facility]                                                                              Jocob Dambach, is a 78 y.o. 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  Patient had come to the ER about 3 days ago with shortness of breath with cough and wheezing.  At that time patient was diagnosed with possible pneumonia and bronchitis was discharged home on antibiotics and prednisone .  Due to worsening shortness of breath patient presents back to the ER.  Denied any chest pain or abdominal pain.   ED Course: In the ER patient was acutely short of breath and had to be placed on BiPAP.  Chest x-ray shows features concerning for fluid overload was given Lasix  40 mg IV.  Troponin remained flat.  On exam patient was diffusely wheezing, was placed on nebulizer therapy.  COVID flu test were negative respiratory panel was  negative 2 days ago.   proBNP was 2400 troponins were 57 and 52.  WBC count was 16.1.  Assessment & Plan    Principal Problem:   Acute hypoxemic respiratory failure (HCC) Acute on chronic diastolic CHF - Initially placed on BiPAP in ED, improved after Lasix  IV 40 mg x 1 and nebulizer treatments - Multifactorial likely due to acute CHF versus acute bronchitis with PNA, history of PE, BNP 2462 - 2D echo showed EF 70 to 75%, severe asymmetric LVH of the basal septal segment, left ventricular diastolic function could not be evaluated, degenerative mitral valve, trivial MR, moderate mitral annular  calcification, repaired aortic valve, trivial AR, moderate AS. 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 Kate today - Improving, negative balance of 11.2 L  Severe bioprosthetic aortic valve stenosis - Cardiology recommendations reviewed and noted severe bioprosthetic aortic valve stenosis, CT showed high risk anatomy, not amenable for TAVR.  Evaluated by Dr. Wendel on 9/13, plan outpatient workup.  Acute bronchitis/pneumonia - Chest x-ray shows no dense consolidation, favors pulmonary edema - Continue IV Lasix , - Continue IV doxycycline , Rocephin , no wheezing -  continue albuterol  as needed   CAD status post CABG, HTN -Currently no chest pain, troponins flat, continue eliquis , beta-blocker, statin - 2D echo showed EF 70 to 75%, severe asymmetric LVH of the basal septal segment,  left ventricular diastolic function could not be evaluated, degenerative mitral valve, trivial MR, moderate mitral annular calcification, repaired aortic valve, trivial AR, moderate AS. - Cardiac cath with no coronary artery disease   History of saddle PE, on anticoagulation - On IV heparin  drip due to interventions  - Will resume Eliquis  once cleared by cardiology  Mild acute kidney injury - Improving, creatinine stable  Diabetes mellitus type 2 - On Ozempic  and Farxiga .  Hemoglobin A1c 5.9 - For now continue sliding scale insulin  while inpatient CBG (last 3)  Recent Labs    05/17/24 2108 05/18/24 0618 05/18/24 1122  GLUCAP 142* 108* 178*     Hypothyroidism - Continue Synthroid   Diarrhea - C. difficile negative, GI  pathogen panel   Obesity class I Estimated body mass index is 29 kg/m as calculated from the following:   Height as of this encounter: 5' 11 (1.803 m).   Weight as of this encounter: 94.3 kg.  Code Status: Full code DVT Prophylaxis:     Level of Care: Level of care: Progressive Family Communication: Updated patient's wife at the bedside Disposition Plan:      Remains inpatient appropriate:      Procedures:  2D echo TEE  Right and left heart cath   Consultants:   Cardiology  Antimicrobials:   Anti-infectives (From admission, onward)    Start     Dose/Rate Route Frequency Ordered Stop   05/16/24 2200  doxycycline  (VIBRAMYCIN ) 100 mg in sodium chloride  0.9 % 250 mL IVPB        100 mg 125 mL/hr over 120 Minutes Intravenous Every 12 hours 05/16/24 1616     05/14/24 1800  cefTRIAXone  (ROCEPHIN ) 2 g in sodium chloride  0.9 % 100 mL IVPB        2 g 200 mL/hr over 30 Minutes Intravenous Every 24 hours 05/14/24 0536     05/14/24 0630  doxycycline  (VIBRAMYCIN ) 100 mg in sodium chloride  0.9 % 250 mL IVPB  Status:  Discontinued        100 mg 125 mL/hr over 120 Minutes Intravenous Every 12 hours 05/14/24 0532 05/16/24 1616   05/13/24 1815  cefTRIAXone  (ROCEPHIN ) 1 g in sodium chloride  0.9 % 100 mL IVPB        1 g 200 mL/hr over 30 Minutes Intravenous  Once 05/13/24 1808 05/13/24 1926   05/13/24 1815  azithromycin  (ZITHROMAX ) 500 mg in sodium chloride  0.9 % 250 mL IVPB        500 mg 250 mL/hr over 60 Minutes Intravenous  Once 05/13/24 1808 05/13/24 2049          Medications  aspirin  EC  81 mg Oral Daily   Chlorhexidine  Gluconate Cloth  6 each Topical Daily   empagliflozin   10 mg Oral Daily   furosemide   40 mg Intravenous Daily   insulin  aspart  0-9 Units Subcutaneous TID WC   levothyroxine   50 mcg Oral QAC breakfast   mirabegron  ER  25 mg Oral Daily   rosuvastatin   40 mg Oral QHS   sodium chloride  flush  10-40 mL Intracatheter Q12H      Subjective:   Matias Thurman was seen and examined today.  Diarrhea resolved, sitting up in the chair.  Feeling somewhat better today, good diuresis, 11.2 L negative.,  Objective:   Vitals:   05/18/24 0307 05/18/24 0353 05/18/24 0811 05/18/24 1115  BP:  132/72 (!) 149/73 (!) 130/97  Pulse: 75 79 75 88  Resp:  18 19 16  Temp:  97.8 F (36.6 C) 98.2 F (36.8 C)   TempSrc:  Oral Oral   SpO2: 97% 96% 95%   Weight: 94.3 kg     Height:        Intake/Output Summary (Last 24 hours) at 05/18/2024 1154 Last data filed at 05/18/2024 0929 Gross per 24 hour  Intake 1847.15 ml  Output 2250 ml  Net -402.85 ml     Wt Readings from Last 3 Encounters:  05/18/24 94.3 kg  04/07/24 99.2 kg  02/04/24 99.2 kg   Physical Exam General: Alert and oriented x 3, NAD Cardiovascular: S1 S2 clear, RRR.  3/6 systolic murmur Respiratory: CTAB, no wheezing Gastrointestinal: Soft, nontender, nondistended, NBS Ext: no pedal edema bilaterally Psych: Normal affect    Data Reviewed:  I have personally reviewed following labs    CBC Lab Results  Component Value Date   WBC 8.2 05/18/2024   RBC 5.33 05/18/2024   HGB 16.0 05/18/2024   HCT 49.3 05/18/2024   MCV 92.5 05/18/2024   MCH 30.0 05/18/2024   PLT 166 05/18/2024   MCHC 32.5 05/18/2024   RDW 15.3 05/18/2024   LYMPHSABS 0.8 05/14/2024   MONOABS 1.3 (H) 05/14/2024   EOSABS 0.1 05/14/2024   BASOSABS 0.0 05/14/2024     Last metabolic panel Lab Results  Component Value Date   NA 137 05/18/2024   K 3.9 05/18/2024   CL 98 05/18/2024   CO2 25 05/18/2024   BUN 25 (H) 05/18/2024   CREATININE 1.14 05/18/2024   GLUCOSE 119 (H) 05/18/2024   GFRNONAA >60 05/18/2024   GFRAA >60 01/08/2018   CALCIUM  9.1 05/18/2024   PHOS 3.9 05/18/2024   PROT 7.0 05/14/2024   ALBUMIN  3.4 (L) 05/18/2024   LABGLOB 2.8 08/21/2023   BILITOT 1.2 05/14/2024   ALKPHOS 61 05/14/2024   AST 35 05/14/2024   ALT 25 05/14/2024   ANIONGAP 14 05/18/2024    CBG (last 3)  Recent Labs     05/17/24 2108 05/18/24 0618 05/18/24 1122  GLUCAP 142* 108* 178*      Coagulation Profile: No results for input(s): INR, PROTIME in the last 168 hours.   Radiology Studies: I have personally reviewed the imaging studies  US  EKG SITE RITE Result Date: 05/17/2024 If Site Rite image not attached, placement could not be confirmed due to current cardiac rhythm.  CT CORONARY MORPH W/CTA COR W/SCORE W/CA W/CM &/OR WO/CM Addendum Date: 05/16/2024 ADDENDUM REPORT: 05/16/2024 16:50 EXAM: OVER-READ INTERPRETATION  CT CHEST The following report is an over-read performed by radiologist Dr. Lynwood Seip [facility] on 05/16/2024. This over-read does not include interpretation of cardiac or coronary anatomy or pathology. The coronary CTA interpretation by the cardiologist is attached. COMPARISON:  None. FINDINGS: The visualized portions of the extracardiac vascular structures of the chest are unremarkable. Visualized mediastinum is unremarkable. Visualized portion of upper abdomen is unremarkable. Small right pleural effusion is noted with minimal adjacent subsegmental atelectasis. Patchy airspace opacities are seen in both upper lobes concerning for multifocal pneumonia. Visualized skeleton is unremarkable. IMPRESSION: Small right pleural effusion is noted with minimal adjacent subsegmental atelectasis. Patchy airspace opacities are seen in both upper lobes concerning for multifocal pneumonia. Electronically Signed   By: Lynwood Seip Raddle M.D.   On: 05/16/2024 16:50   Result Date: 05/16/2024 CLINICAL DATA:  Aortic Valve pathology with assessment for TAVR (prior SAVR) EXAM: Cardiac TAVR CT TECHNIQUE: The patient was scanned on a Siemens Force 192 slice scanner. A 120 kV  retrospective scan was triggered in the descending thoracic aorta at 111 HU's. Gantry rotation speed was 270 msecs and collimation was .9 mm. No beta blockade or nitro were given. The 3D data set was reconstructed in 5%  intervals of the R-R cycle. Systolic and diastolic phases were analyzed on a dedicated work station using MPR, MIP and VRT modes. The patient received 100 cc of contrast. FINDINGS: Prosthetic Valve: 25 mm Edwards Magna Ease Pericardial Tissue Valve (implanted 08/16/2011). Hypoattenuation and degree: All three prosthetic leaflets are prominently calcified. In cine evaluation, the non and the left leaflet are significantly hypokinetic and restricted. Normal motion of the right leaflet. Mild annular hypoattenuation. Annular calcification: Severe- large protruding LVOT/subannular calcification. The intervalvular fibrosa is heavily calcified. Mitral Valve: The is anterior leaflet restriction that arises from large anterior annular calcification that extends to restriction the A2 leaflet. Mild posterior annular calcification. Presence of basal septal hypertrophy: Severe, 16 mm Perimembranous septal diameter: 4 mm Aortic Measurements- 65% phase due to artifact Sinotubular Junction: 29 mm. Adventitial thickening noted. This may be related to post-operative changes. Ascending Thoracic Aorta: 36 mm Descending Thoracic Aorta: 26 mm Optimum Fluoroscopic Angle for Delivery: LAO 5, CAU 4 Left Main Coronary angle: RAO 0, CAU 11 Valves for structural team consideration (with Valve in Valve App Support): 26 mm Sapien is favored. Left main virtual Valve to coronary distance: 4 mm Right coronary virtual Valve to coronary distance: 5 mm Evolut Left main virtual Valve to coronary distance: 3 mm Right coronary virtual Valve to coronary distance:4 mm In the setting of severe subannular calcification, study assumes a non-fractured valve strategy. Left main coronary height 7 mm. Left SOV height 14 mm. Estimated left prosthetic leaflet length 14 mm. Recommend heart team meeting for left coronary system deployment planning. Right coronary height 8 mm. Right SOV height 15 mm. Non TAVR Valve Findings: Coronary Arteries: Normal coronary origin.  Study not completed with nitroglycerin . Suboptimal coronary assessment due to motion. Suggests clinical correlation to 05/15/24 heart catheterization. Systemic veins: Normal caliber Main Pulmonary artery: Normal caliber Pulmonary veins: Normal anatomy. Left atrial appendage: Patent. Interatrial septum: Lipomatous interatrial septal hypertrophy noted without a left to right communication. Chamber dimensions: Left atrial dilation. Pericardium: No calcification. Extra Cardiac Findings as per separate reporting. Notable artifacts: Slab and motion artifact; this affect the diastolic assessment of the annulus. Image quality: Poor IMPRESSION: 1. Prosthetic aortic stenosis. Findings pertinent to TAVR procedure are detailed above. RECOMMENDATIONS: The proposed cut-off value of 1,651 AU yielded a 93 % sensitivity and 75 % specificity in grading AS severity in patients with classical low-flow, low-gradient AS. Proposed different cut-off values to define severe AS for men and women as 2,065 AU and 1,274 AU, respectively. The joint European and American recommendations for the assessment of AS consider the aortic valve calcium  score as a continuum - a very high calcium  score suggests severe AS and a low calcium  score suggests severe AS is unlikely. Donney VEAR Jarome LULLA Stephen RENETTE, et al. 2017 ESC/EACTS Guidelines for the management of valvular heart disease. Eur Heart J [phone] Coronary artery calcium  (CAC) score is a strong predictor of incident coronary heart disease (CHD) and provides predictive information beyond traditional risk factors. CAC scoring is reasonable to use in the decision to withhold, postpone, or initiate statin therapy in intermediate-risk or selected borderline-risk asymptomatic adults (age 24-75 years and LDL-C >=70 to <190 mg/dL) who do not have diabetes or established atherosclerotic cardiovascular disease (ASCVD).* In intermediate-risk (10-year ASCVD risk >=7.5% to <  20%) adults or selected  borderline-risk (10-year ASCVD risk >=5% to <7.5%) adults in whom a CAC score is measured for the purpose of making a treatment decision the following recommendations have been made: If CAC = 0, it is reasonable to withhold statin therapy and reassess in 5 to 10 years, as long as higher risk conditions are absent (diabetes mellitus, family history of premature CHD in first degree relatives (males <55 years; females <65 years), cigarette smoking, LDL >=190 mg/dL or other independent risk factors). If CAC is 1 to 99, it is reasonable to initiate statin therapy for patients >=28 years of age. If CAC is >=100 or >=75th percentile, it is reasonable to initiate statin therapy at any age. Cardiology referral should be considered for patients with CAC scores >=400 or >=75th percentile. *2018 AHA/ACC/AACVPR/AAPA/ABC/ACPM/ADA/AGS/APhA/ASPC/NLA/PCNA Guideline on the Management of Blood Cholesterol: A Report of the American College of Cardiology/American Heart Association Task Force on Clinical Practice Guidelines. J Am Coll Cardiol. 2019;73(24):3168-3209. Mahesh  Chandrasekhar Electronically Signed: By: Stanly Leavens M.D. On: 05/16/2024 16:42   CT Angio Abd/Pel w/ and/or w/o Result Date: 05/16/2024 CLINICAL DATA:  Preop for transcatheter aortic valve repair. EXAM: CT ANGIOGRAPHY CHEST, ABDOMEN AND PELVIS TECHNIQUE: Non-contrast CT of the chest was initially obtained. Multidetector CT imaging through the chest, abdomen and pelvis was performed using the standard protocol during bolus administration of intravenous contrast. Multiplanar reconstructed images and MIPs were obtained and reviewed to evaluate the vascular anatomy. RADIATION DOSE REDUCTION: This exam was performed according to the departmental dose-optimization program which includes automated exposure control, adjustment of the mA and/or kV according to patient size and/or use of iterative reconstruction technique. CONTRAST:  OMNIPAQUE  IOHEXOL  350  MG/ML SOLN COMPARISON:  April 25, 2022. FINDINGS: CTA CHEST FINDINGS Cardiovascular: Status post aortic valve repair. There is no evidence of thoracic aortic dissection or aneurysm. Status post coronary bypass graft. Great vessels are widely patent without stenosis. Mediastinum/Nodes: Esophagus is unremarkable. Thyroid  gland is unremarkable. Calcified mediastinal lymph nodes are again noted suggesting prior granulomatous disease. Lungs/Pleura: Small right pleural effusion is noted with minimal adjacent subsegmental atelectasis. Patchy airspace opacities are noted throughout both lungs most consistent with multifocal pneumonia. Musculoskeletal: No chest wall abnormality. No acute or significant osseous findings. Review of the MIP images confirms the above findings. CTA ABDOMEN AND PELVIS FINDINGS VASCULAR Aorta: Atherosclerosis of abdominal aorta is noted without aneurysm or dissection. Celiac: Patent without evidence of aneurysm, dissection, vasculitis or significant stenosis. SMA: Moderate narrowing is noted at origin secondary to calcified plaque. Renals: Both renal arteries are patent without evidence of aneurysm, dissection, vasculitis, fibromuscular dysplasia or significant stenosis. IMA: Severe narrowing is noted at origin secondary to calcified plaque. Inflow: Patent without evidence of aneurysm, dissection, vasculitis or significant stenosis. Veins: No obvious venous abnormality within the limitations of this arterial phase study. Review of the MIP images confirms the above findings. NON-VASCULAR Hepatobiliary: Minimal cholelithiasis. No biliary dilatation. Liver is unremarkable. Pancreas: Unremarkable. No pancreatic ductal dilatation or surrounding inflammatory changes. Spleen: Normal in size without focal abnormality. Adrenals/Urinary Tract: Adrenal glands are unremarkable. Kidneys are normal, without renal calculi, focal lesion, or hydronephrosis. Bladder is unremarkable. Stomach/Bowel: Stomach is within  normal limits. Appendix appears normal. No evidence of bowel wall thickening, distention, or inflammatory changes. Lymphatic: No adenopathy is noted. Reproductive: Prostate is unremarkable. Other: No abdominal wall hernia or abnormality. No abdominopelvic ascites. Musculoskeletal: No acute or significant osseous findings. Review of the MIP images confirms the above findings. IMPRESSION: 1. Status  post aortic valve repair. No evidence of thoracic or abdominal aortic dissection or aneurysm. 2. Small right pleural effusion is noted with minimal adjacent subsegmental atelectasis. 3. Patchy airspace opacities are noted throughout both lungs most consistent with multifocal pneumonia. 4. Minimal cholelithiasis. 5. Aortic atherosclerosis. Aortic Atherosclerosis (ICD10-I70.0). Electronically Signed   By: Lynwood Landy Raddle M.D.   On: 05/16/2024 15:54   CT ANGIO CHEST AORTA W/CM & OR WO/CM Result Date: 05/16/2024 CLINICAL DATA:  Preop for transcatheter aortic valve repair. EXAM: CT ANGIOGRAPHY CHEST, ABDOMEN AND PELVIS TECHNIQUE: Non-contrast CT of the chest was initially obtained. Multidetector CT imaging through the chest, abdomen and pelvis was performed using the standard protocol during bolus administration of intravenous contrast. Multiplanar reconstructed images and MIPs were obtained and reviewed to evaluate the vascular anatomy. RADIATION DOSE REDUCTION: This exam was performed according to the departmental dose-optimization program which includes automated exposure control, adjustment of the mA and/or kV according to patient size and/or use of iterative reconstruction technique. CONTRAST:  OMNIPAQUE  IOHEXOL  350 MG/ML SOLN COMPARISON:  April 25, 2022. FINDINGS: CTA CHEST FINDINGS Cardiovascular: Status post aortic valve repair. There is no evidence of thoracic aortic dissection or aneurysm. Status post coronary bypass graft. Great vessels are widely patent without stenosis. Mediastinum/Nodes: Esophagus is  unremarkable. Thyroid  gland is unremarkable. Calcified mediastinal lymph nodes are again noted suggesting prior granulomatous disease. Lungs/Pleura: Small right pleural effusion is noted with minimal adjacent subsegmental atelectasis. Patchy airspace opacities are noted throughout both lungs most consistent with multifocal pneumonia. Musculoskeletal: No chest wall abnormality. No acute or significant osseous findings. Review of the MIP images confirms the above findings. CTA ABDOMEN AND PELVIS FINDINGS VASCULAR Aorta: Atherosclerosis of abdominal aorta is noted without aneurysm or dissection. Celiac: Patent without evidence of aneurysm, dissection, vasculitis or significant stenosis. SMA: Moderate narrowing is noted at origin secondary to calcified plaque. Renals: Both renal arteries are patent without evidence of aneurysm, dissection, vasculitis, fibromuscular dysplasia or significant stenosis. IMA: Severe narrowing is noted at origin secondary to calcified plaque. Inflow: Patent without evidence of aneurysm, dissection, vasculitis or significant stenosis. Veins: No obvious venous abnormality within the limitations of this arterial phase study. Review of the MIP images confirms the above findings. NON-VASCULAR Hepatobiliary: Minimal cholelithiasis. No biliary dilatation. Liver is unremarkable. Pancreas: Unremarkable. No pancreatic ductal dilatation or surrounding inflammatory changes. Spleen: Normal in size without focal abnormality. Adrenals/Urinary Tract: Adrenal glands are unremarkable. Kidneys are normal, without renal calculi, focal lesion, or hydronephrosis. Bladder is unremarkable. Stomach/Bowel: Stomach is within normal limits. Appendix appears normal. No evidence of bowel wall thickening, distention, or inflammatory changes. Lymphatic: No adenopathy is noted. Reproductive: Prostate is unremarkable. Other: No abdominal wall hernia or abnormality. No abdominopelvic ascites. Musculoskeletal: No acute or  significant osseous findings. Review of the MIP images confirms the above findings. IMPRESSION: 1. Status post aortic valve repair. No evidence of thoracic or abdominal aortic dissection or aneurysm. 2. Small right pleural effusion is noted with minimal adjacent subsegmental atelectasis. 3. Patchy airspace opacities are noted throughout both lungs most consistent with multifocal pneumonia. 4. Minimal cholelithiasis. 5. Aortic atherosclerosis. Aortic Atherosclerosis (ICD10-I70.0). Electronically Signed   By: Lynwood Landy Raddle M.D.   On: 05/16/2024 15:54       Asna Muldrow M.D. [facility] 05/18/2024, 11:54 AM  Available via Epic secure chat 7am-7pm After 7 pm, please refer to night coverage provider listed on amion.

## 2024-05-18 NOTE — Progress Notes (Signed)
 Pt ambulated in hall 470', minimal assist, without use of walker or O2. Tolerated very well.

## 2024-05-19 DIAGNOSIS — I2602 Saddle embolus of pulmonary artery with acute cor pulmonale: Secondary | ICD-10-CM | POA: Diagnosis not present

## 2024-05-19 DIAGNOSIS — I509 Heart failure, unspecified: Secondary | ICD-10-CM | POA: Diagnosis not present

## 2024-05-19 DIAGNOSIS — J9601 Acute respiratory failure with hypoxia: Secondary | ICD-10-CM | POA: Diagnosis not present

## 2024-05-19 DIAGNOSIS — I5033 Acute on chronic diastolic (congestive) heart failure: Secondary | ICD-10-CM | POA: Diagnosis not present

## 2024-05-19 LAB — GLUCOSE, CAPILLARY
Glucose-Capillary: 104 mg/dL — ABNORMAL HIGH (ref 70–99)
Glucose-Capillary: 128 mg/dL — ABNORMAL HIGH (ref 70–99)
Glucose-Capillary: 93 mg/dL (ref 70–99)
Glucose-Capillary: 127 mg/dL — ABNORMAL HIGH (ref 70–99)

## 2024-05-19 LAB — RENAL FUNCTION PANEL
Albumin: 3.2 g/dL — ABNORMAL LOW (ref 3.5–5.0)
Anion gap: 11 (ref 5–15)
BUN: 22 mg/dL (ref 8–23)
CO2: 25 mmol/L (ref 22–32)
Calcium: 8.9 mg/dL (ref 8.9–10.3)
Chloride: 102 mmol/L (ref 98–111)
Creatinine, Ser: 1.04 mg/dL (ref 0.61–1.24)
GFR, Estimated: >60 mL/min (ref >60)
Glucose, Bld: 124 mg/dL — ABNORMAL HIGH (ref 70–99)
Phosphorus: 3.6 mg/dL (ref 2.5–4.6)
Potassium: 4 mmol/L (ref 3.5–5.1)
Sodium: 138 mmol/L (ref 135–145)

## 2024-05-19 LAB — CBC
HCT: 47.4 % (ref 39.0–52.0)
Hemoglobin: 15.4 g/dL (ref 13.0–17.0)
MCH: 30.3 pg (ref 26.0–34.0)
MCHC: 32.5 g/dL (ref 30.0–36.0)
MCV: 93.3 fL (ref 80.0–100.0)
Platelets: 169 K/uL (ref 150–400)
RBC: 5.08 MIL/uL (ref 4.22–5.81)
RDW: 15.1 % (ref 11.5–15.5)
WBC: 8.6 K/uL (ref 4.0–10.5)
nRBC: 0 % (ref 0.0–0.2)

## 2024-05-19 LAB — APTT: aPTT: 100 s — ABNORMAL HIGH (ref 24–36)

## 2024-05-19 LAB — COOXEMETRY PANEL
Carboxyhemoglobin: 1.1 % (ref 0.5–1.5)
Carboxyhemoglobin: 2 % — ABNORMAL HIGH (ref 0.5–1.5)
Methemoglobin: <0.7 % (ref 0.0–1.5)
Methemoglobin: <0.7 % (ref 0.0–1.5)
O2 Saturation: 54.7 %
O2 Saturation: 73.7 %
Total hemoglobin: 15.7 g/dL (ref 12.0–16.0)
Total hemoglobin: 15.9 g/dL (ref 12.0–16.0)

## 2024-05-19 LAB — HEPARIN LEVEL (UNFRACTIONATED): Heparin Unfractionated: 0.62 [IU]/mL (ref 0.30–0.70)

## 2024-05-19 MED ORDER — METOPROLOL TARTRATE 25 MG PO TABS
25.0000 mg | ORAL_TABLET | Freq: Two times a day (BID) | ORAL | Status: DC
Start: 2024-05-19 — End: 2024-05-19
  Administered 2024-05-19: 25 mg via ORAL
  Filled 2024-05-19 (×2): qty 1

## 2024-05-19 MED ORDER — APIXABAN 2.5 MG PO TABS
2.5000 mg | ORAL_TABLET | Freq: Two times a day (BID) | ORAL | Status: DC
  Administered 2024-05-19 – 2024-05-20 (×3): 2.5 mg via ORAL
  Filled 2024-05-19 (×3): qty 1

## 2024-05-19 MED ORDER — FUROSEMIDE 40 MG PO TABS
40.0000 mg | ORAL_TABLET | Freq: Every day | ORAL | Status: DC
  Administered 2024-05-19: 40 mg via ORAL
  Filled 2024-05-19: qty 1

## 2024-05-19 NOTE — Plan of Care (Signed)
  Problem: Education: Goal: Knowledge of General Education information will improve Description: Including pain rating scale, medication(s)/side effects and non-pharmacologic comfort measures Outcome: Progressing   Problem: Health Behavior/Discharge Planning: Goal: Ability to manage health-related needs will improve Outcome: Progressing   Problem: Clinical Measurements: Goal: Ability to maintain clinical measurements within normal limits will improve Outcome: Progressing Goal: Will remain free from infection Outcome: Progressing Goal: Diagnostic test results will improve Outcome: Progressing Goal: Respiratory complications will improve Outcome: Progressing Goal: Cardiovascular complication will be avoided Outcome: Progressing   Problem: Activity: Goal: Risk for activity intolerance will decrease Outcome: Progressing   Problem: Nutrition: Goal: Adequate nutrition will be maintained Outcome: Progressing   Problem: Coping: Goal: Level of anxiety will decrease Outcome: Progressing   Problem: Elimination: Goal: Will not experience complications related to bowel motility Outcome: Progressing Goal: Will not experience complications related to urinary retention Outcome: Progressing   Problem: Pain Managment: Goal: General experience of comfort will improve and/or be controlled Outcome: Progressing   Problem: Safety: Goal: Ability to remain free from injury will improve Outcome: Progressing   Problem: Skin Integrity: Goal: Risk for impaired skin integrity will decrease Outcome: Progressing   Problem: Education: Goal: Ability to describe self-care measures that may prevent or decrease complications (Diabetes Survival Skills Education) will improve Outcome: Progressing   Problem: Coping: Goal: Ability to adjust to condition or change in health will improve Outcome: Progressing   Problem: Fluid Volume: Goal: Ability to maintain a balanced intake and output will  improve Outcome: Progressing   Problem: Health Behavior/Discharge Planning: Goal: Ability to identify and utilize available resources and services will improve Outcome: Progressing Goal: Ability to manage health-related needs will improve Outcome: Progressing   Problem: Metabolic: Goal: Ability to maintain appropriate glucose levels will improve Outcome: Progressing   Problem: Nutritional: Goal: Maintenance of adequate nutrition will improve Outcome: Progressing Goal: Progress toward achieving an optimal weight will improve Outcome: Progressing   Problem: Skin Integrity: Goal: Risk for impaired skin integrity will decrease Outcome: Progressing   Problem: Tissue Perfusion: Goal: Adequacy of tissue perfusion will improve Outcome: Progressing   Problem: Education: Goal: Understanding of CV disease, CV risk reduction, and recovery process will improve Outcome: Progressing Goal: Individualized Educational Video(s) Outcome: Progressing   Problem: Activity: Goal: Ability to return to baseline activity level will improve Outcome: Progressing   Problem: Cardiovascular: Goal: Ability to achieve and maintain adequate cardiovascular perfusion will improve Outcome: Progressing Goal: Vascular access site(s) Level 0-1 will be maintained Outcome: Progressing   Problem: Health Behavior/Discharge Planning: Goal: Ability to safely manage health-related needs after discharge will improve Outcome: Progressing

## 2024-05-19 NOTE — Progress Notes (Addendum)
 Advanced Heart Failure Rounding Note  Cardiologist: Oneil Parchment, MD  Chief Complaint: Heart Failure  Subjective:   Yesterday diuresed with IV lasix .   No complaints.    Objective:   Weight Range: 94.6 kg Body mass index is 29.08 kg/m.   Vital Signs:   Temp:  [97.7 F (36.5 C)-98.5 F (36.9 C)] 97.7 F (36.5 C) (09/15 0452) Pulse Rate:  [52-111] 52 (09/15 0452) Resp:  [16-20] 20 (09/15 0452) BP: (113-149)/(49-97) 115/53 (09/15 0452) SpO2:  [95 %-98 %] 96 % (09/15 0452) Weight:  [94.6 kg] 94.6 kg (09/15 0346) Last BM Date : 05/17/24  Weight change: Filed Weights   05/17/24 0533 05/18/24 0307 05/19/24 0346  Weight: 94.4 kg 94.3 kg 94.6 kg    Intake/Output:   Intake/Output Summary (Last 24 hours) at 05/19/2024 0653 Last data filed at 05/19/2024 0604 Gross per 24 hour  Intake 120 ml  Output 1950 ml  Net -1830 ml     CVP 3-4 personally checked.  Physical Exam   General:   No resp difficulty Neck: no JVD.  Cor: Regular rate & rhythm. RUSB Lungs: clear Abdomen: soft, nontender, nondistended.  Extremities: no  edema Neuro: alert & oriented x3  Telemetry   SR   EKG    N/A   Labs    CBC Recent Labs    05/18/24 0541 05/19/24 0504  WBC 8.2 8.6  HGB 16.0 15.4  HCT 49.3 47.4  MCV 92.5 93.3  PLT 166 169   Basic Metabolic Panel Recent Labs    90/85/74 0541 05/19/24 0504  NA 137 138  K 3.9 4.0  CL 98 102  CO2 25 25  GLUCOSE 119* 124*  BUN 25* 22  CREATININE 1.14 1.04  CALCIUM  9.1 8.9  PHOS 3.9 3.6   Liver Function Tests Recent Labs    05/18/24 0541 05/19/24 0504  ALBUMIN  3.4* 3.2*   No results for input(s): LIPASE, AMYLASE in the last 72 hours. Cardiac Enzymes No results for input(s): CKTOTAL, CKMB, CKMBINDEX, TROPONINI in the last 72 hours.  BNP: BNP (last 3 results) Recent Labs    05/14/24 1810  BNP 907.6*    ProBNP (last 3 results) Recent Labs    05/10/24 0916 05/13/24 1425  PROBNP 1,252.0* 2,462.0*      D-Dimer No results for input(s): DDIMER in the last 72 hours. Hemoglobin A1C No results for input(s): HGBA1C in the last 72 hours. Fasting Lipid Panel No results for input(s): CHOL, HDL, LDLCALC, TRIG, CHOLHDL, LDLDIRECT in the last 72 hours. Thyroid  Function Tests No results for input(s): TSH, T4TOTAL, T3FREE, THYROIDAB in the last 72 hours.  Invalid input(s): FREET3  Other results:   Imaging    No results found.   Medications:     Scheduled Medications:  aspirin  EC  81 mg Oral Daily   Chlorhexidine  Gluconate Cloth  6 each Topical Daily   empagliflozin   10 mg Oral Daily   insulin  aspart  0-9 Units Subcutaneous TID WC   levothyroxine   50 mcg Oral QAC breakfast   metoprolol  tartrate  25 mg Oral BID   mirabegron  ER  25 mg Oral Daily   rosuvastatin   40 mg Oral QHS   sodium chloride  flush  10-40 mL Intracatheter Q12H    Infusions:  heparin  1,550 Units/hr (05/18/24 1857)    PRN Medications: acetaminophen  **OR** acetaminophen , albuterol , melatonin, mouth rinse, sodium chloride  flush, sodium chloride  flush    Patient Profile   Kirk [name] is a 78 year old  with acute on chronic diastolic heart failure in the setting of 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  Assessment/Plan  Severe aortic valve stenosis: Previous surgical valve in 2012. Based on CTA will need coronary leaflet modification prior to procedure.  - Likely follow up in [facility] clinic for leaflet modification and TAVR. Dr Wendel arranging.  -Appears stable. Hold off on RHC.  Acute on chronic diastolic HF Pulmonary hypertension, WHO Group II In the setting of severe valvular disease, appears improved from a HF standpoint. If ambulating well without significant symptoms can hold off on repeat RHC. - CVP 3-4. Appears euvolemic.  CO-OX 55%.  NO BB for now with low output.  - Continue jardiance  10mg  daily - Hold off on spiro with hyperkalemia a few days  ago.  - Renal function stable.    Mitral regurgitation:  - Moderate to severe, given MAC is a poor clip candidate - Suspect will improve after management of aortic valve disease   CAD - Mild disease, nonobstructive, prior LIMA-D1   PE:  - Prior history, on heparin  gtt -Can stop heparin  drip. Start eliquis  2.5 mg twice a day.   We will arrange HF follow up .  Eliquis  2.5 mg twice a day  Farxiga  10 mg daily (on PTA)  Lasix  40 mg daily    Repeat CO-OX now. If stable can go home later today.   [name], NP  05/19/2024, 6:53 AM  Advanced Heart Failure Team Pager [phone] (M-F; 7a - 5p)  Please contact [facility] for night-coverage after hours (5p -7a ) and weekends on [email or web address]

## 2024-05-19 NOTE — Progress Notes (Signed)
 PHARMACY - ANTICOAGULATION CONSULT NOTE  Pharmacy Consult for heparin  >> apixaban  Indication: history of PE/DVT  Allergies  Allergen Reactions   Cat Dander Shortness Of Breath and Swelling   Hydrocodone  Other (See Comments)    Seizures    Atorvastatin  Other (See Comments)    Joint pain   Dust Mite Extract Other (See Comments) and Swelling   Metformin Other (See Comments)    Diarrhea, muscle cramps   Oxycodone  Other (See Comments)    Seizure PATIENT DOES NOT WANT ANY NARCOTICS   Semaglutide (0.25 Or 0.5mg -Dos) Nausea Only    AT HIGHER DOSE    Patient Measurements: Height: 5' 11 (180.3 cm) Weight: 94.6 kg (208 lb 8 oz) IBW/kg (Calculated) : 75.3 HEPARIN  DW (KG): 96.4  Vital Signs: Temp: 97.7 F (36.5 C) (09/15 0452) Temp Source: Oral (09/15 0452) BP: 115/53 (09/15 0452) Pulse Rate: 52 (09/15 0452)  Labs: Recent Labs    05/17/24 0903 05/17/24 0943 05/18/24 0541 05/19/24 0504  HGB 17.2*  --  16.0 15.4  HCT 54.5*  --  49.3 47.4  PLT 176  --  166 169  APTT 90*  --  96* 100*  HEPARINUNFRC  --  0.77* 0.67 0.62  CREATININE 1.16  --  1.14 1.04    Estimated Creatinine Clearance: 70.9 mL/min (by C-G formula based on SCr of 1.04 mg/dL).   Medical History: Past Medical History:  Diagnosis Date   Allergy    Anal cancer (HCC) 04/2012   Aortic stenosis    valve replacement   Arthritis    1995- cerv. 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   CHF (congestive heart failure) (HCC)    Coronary artery disease    aortic stenosis, CAD   Diabetes mellitus    Type 2   DVT (deep venous thrombosis) (HCC)    ED (erectile dysfunction)    Elevated cholesterol    Heart murmur    no problems   History of kidney stones    passed, lithrotrispy and surgery   History of radiation therapy 05/20/12-06/28/12   anal cancer=54gy  total dose   Hypertension    Hypothyroidism    Kidney stones    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   Malignant neoplasm of other sites of rectum, rectosigmoid junction, and anus 04/12/2012   Myocardial infarction (HCC) 2012   Personal history of colonic polyps 03/20/2012   Pneumonia    Pulmonary embolism (HCC)    Rectal bleeding    Rectal mass 03/20/2012   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   05/13/2024 Morning   benzonatate  (TESSALON ) 100 MG capsule Take 1 capsule (100 mg total) by mouth every  8 (eight) hours. 21 capsule 0 05/13/2024 Morning   DULoxetine  (CYMBALTA ) 60 MG capsule Take 1 capsule by mouth at bedtime 90 capsule 0 05/12/2024 Bedtime   FARXIGA  5 MG TABS tablet Take 1 tablet by mouth once daily 90 tablet 0 05/13/2024 Morning   levothyroxine  (SYNTHROID ) 50 MCG tablet Take 1 tablet (50 mcg total) by mouth daily before breakfast. 90 tablet 1 05/13/2024 Morning   losartan  (COZAAR ) 50 MG tablet Take 1 tablet by mouth once daily 90 tablet 0 05/13/2024 Morning   metoprolol  tartrate (LOPRESSOR ) 25 MG tablet Take 1 tablet by mouth twice daily 180 tablet 0 05/13/2024 Morning   MYRBETRIQ  25 MG TB24 tablet Take 1 tablet (25 mg total) by mouth daily. 90 tablet 3 05/13/2024 Morning   OZEMPIC , 0.25 OR 0.5 MG/DOSE, 2 MG/3ML SOPN INJECT 0.25MG   SUBCUTANEOUSLY ONCE A WEEK 3 mL 0 Past Week   predniSONE  (DELTASONE ) 20 MG tablet 3 tabs po day one, then 2 tabs  daily x 4 days 11 tablet 0 05/13/2024 Morning   rosuvastatin  (CRESTOR ) 40 MG tablet TAKE 1 TABLET BY MOUTH AT BEDTIME 90 tablet 0 05/12/2024 Bedtime   tadalafil  (CIALIS ) 20 MG tablet Take 20 mg by mouth daily as needed for erectile dysfunction.   Taking As Needed   triamcinolone  cream (KENALOG ) 0.1 % APPLY CREAM EXTERNALLY TO AFFECTED AREA TWICE DAILY AS NEEDED FOR ITCHING   Taking   Vitamins-Lipotropics (LIPO-FLAVONOID PLUS) TABS Take 1 tablet by mouth daily.   05/12/2024   zinc sulfate, 50mg  elemental zinc, 220 (50 Zn) MG capsule Take 220 mg by mouth daily.   05/12/2024 Evening   HYDROmorphone  (DILAUDID ) 2 MG tablet Take 0.5-1 tablets (1-2 mg total) by mouth every 8 (eight) hours as needed for severe pain (pain score 7-10). (Patient not taking: Reported on 05/13/2024) 21 tablet 0 Not Taking   methocarbamol  (ROBAXIN ) 500 MG tablet Take 1 tablet (500 mg total) by mouth every 6 (six) hours as needed for muscle spasms. (Patient not taking: Reported on 05/13/2024) 40 tablet 0 Not Taking   NEEDLE, DISP, 22 G 22G X 1-1/2 MISC 1 each by Does not apply route once a week. Use to administer medication. 50 each 1    ondansetron  (ZOFRAN ) 4 MG tablet Take 1 tablet (4 mg total) by mouth every 6 (six) hours as needed for nausea. (Patient not taking: Reported on 05/13/2024) 20 tablet 0 Not Taking   traMADol  (ULTRAM ) 50 MG tablet Take 1-2 tablets (50-100 mg total) by mouth every 6 (six) hours as needed for moderate pain (pain score 4-6). (Patient not taking: Reported on 05/13/2024) 40 tablet 0 Not Taking   Scheduled:   aspirin  EC  81 mg Oral Daily   Chlorhexidine  Gluconate Cloth  6 each Topical Daily   empagliflozin   10 mg Oral Daily   insulin  aspart  0-9 Units Subcutaneous TID WC   levothyroxine   50 mcg Oral QAC breakfast   metoprolol  tartrate  25 mg Oral BID   mirabegron  ER  25 mg Oral Daily   rosuvastatin   40 mg Oral QHS   sodium chloride  flush  10-40 mL Intracatheter Q12H    Assessment: 78 yo male here with HF. He is  on apixaban  PTA for history of PE/DVT in 2023 (and history of squamous cell carcinoma).   He is now s/p cath with no CAD and RHC reveals significant volume. Possible surgical intervention with CVTS on Tuesday, 9/16. Started back on heparin  9/12 in anticipation of surgery. Last apixaban  dose 9/12 @  0915.  Heparin  level 0.62 is therapeutic with heparin  running at 1550 units/hr. Hgb (15.4) and PLTs (169) are stable.  No plan for procedures, per AHF team will transition to PTA apixaban .  Goal of Therapy:  Monitor platelets by anticoagulation protocol: Yes   Plan:  - stop heparin  infusion - start PTA apixaban  2.5 mg twice daily -Monitor daily heparin  level -Monitor Hgb, PLT and signs/sx bleeding  Thank you for allowing pharmacy to be a part of this patient's care.   Nidia Schaffer, PharmD PGY2 Cardiology Pharmacy Resident  Please check AMION for all [facility] Pharmacy phone numbers After 10:00 PM, call Main Pharmacy [phone] 05/19/2024 7:12 AM

## 2024-05-19 NOTE — Progress Notes (Signed)
 Heart Failure Navigator Progress Note  Assessed for Heart & Vascular TOC clinic readiness.  Patient does not meet criteria due to Advanced Heart Failure Team was consulted. Original HF TOC appointment on 05/26/2024 was cancelled. .   Navigator will sign off at this time.   Stephane Haddock, BSN, Scientist, clinical (histocompatibility and immunogenetics) Only

## 2024-05-19 NOTE — Progress Notes (Signed)
 [facility]                                                                              Kirk [name], is a 78 y.o. male, DOB - [birth year], FMW:[id number] Admit date - 05/13/2024    Outpatient Primary MD for the patient is [name], [name], MD  LOS - 6  days  Chief Complaint  Patient presents with   Shortness of Breath       Brief summary    Patient is a 78 year old male with bioprosthetic aortic valve replacement, CAD s/p CABG, hypertension, diabetes mellitus type 2, history of anal cancer, prior history of DVT and saddle embolism on Eliquis  was brought to the ER after patient was having worsening shortness of breath.  Patient had come to the ER about 3 days ago with shortness of breath with cough and wheezing.  At that time patient was diagnosed with possible pneumonia and bronchitis was discharged home on antibiotics and prednisone .  Due to worsening shortness of breath patient presents back to the ER.  Denied any chest pain or abdominal pain.   ED Course: In the ER patient was acutely short of breath and had to be placed on BiPAP.  Chest x-ray shows features concerning for fluid overload was given Lasix  40 mg IV.  Troponin remained flat.  On exam patient was diffusely wheezing, was placed on nebulizer therapy.  COVID flu test were negative respiratory panel was  negative 2 days ago.   proBNP was 2400 troponins were 57 and 52.  WBC count was 16.1.  Assessment & Plan    Principal Problem:   Acute hypoxemic respiratory failure (HCC) Acute on chronic diastolic CHF - Initially placed on BiPAP in ED, improved after Lasix  IV 40 mg x 1 and nebulizer treatments - Multifactorial likely due to acute CHF versus acute bronchitis with PNA, history of PE, BNP 2462 - 2D echo showed EF 70 to 75%, severe asymmetric LVH of the basal septal segment, left ventricular diastolic function could not be evaluated, degenerative mitral valve, trivial MR, moderate mitral annular  calcification, repaired aortic valve, trivial AR, moderate AS. 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, preserved RV function, significant volume excess, increased Lasix  to 40 mg twice daily, - Cardiology following, transition to oral Lasix  today, negative balance of 13.1 L - Plan to discharge home tomorrow, reviewed cardiology, Dr. Starla recommendations  Severe bioprosthetic aortic valve stenosis - Cardiology recommendations reviewed and noted severe bioprosthetic aortic valve stenosis, CT showed high risk anatomy, not amenable for TAVR.  Evaluated by Dr. Wendel on 9/13, plan outpatient workup.  Acute bronchitis/pneumonia - Chest x-ray shows no dense consolidation, favors pulmonary edema - Completed IV antibiotics -  continue albuterol  as needed   CAD status post CABG, HTN -Currently no chest pain, troponins flat, continue eliquis , beta-blocker, statin - 2D echo showed EF 70 to 75%, severe asymmetric LVH of the basal septal segment,  left ventricular diastolic function could not be evaluated, degenerative mitral valve, trivial MR, moderate mitral annular calcification, repaired aortic valve, trivial AR, moderate AS. - Cardiac cath with no coronary artery disease   History of saddle PE, on anticoagulation - Eliquis  resumed  Mild acute kidney injury - Improving, creatinine stable  Diabetes mellitus type 2 - On Ozempic  and Farxiga .  Hemoglobin A1c 5.9 - For now continue sliding scale insulin  while inpatient CBG (last 3)  Recent Labs    05/18/24 2055 05/19/24 0605 05/19/24 1126  GLUCAP 141* 104* 128*     Hypothyroidism - Continue Synthroid   Diarrhea - C. difficile negative, GI pathogen panel   Obesity class I Estimated body mass index  is 29.08 kg/m as calculated from the following:   Height as of this encounter: 5' 11 (1.803 m).   Weight as of this encounter: 94.6 kg.  Code Status: Full code DVT Prophylaxis:  apixaban  (ELIQUIS ) tablet 2.5 mg Start: 05/19/24 1015 apixaban  (ELIQUIS ) tablet 2.5 mg   Level of Care: Level of care: Progressive Family Communication: Updated patient's wife at the bedside on 9/14 Disposition Plan:      Remains inpatient appropriate:   Discharge home tomorrow   Procedures:  2D echo TEE  Right and left heart cath   Consultants:   Cardiology  Antimicrobials:   Anti-infectives (From admission, onward)    Start     Dose/Rate Route Frequency Ordered Stop   05/16/24 2200  doxycycline  (VIBRAMYCIN ) 100 mg in sodium chloride  0.9 % 250 mL IVPB        100 mg 125 mL/hr over 120 Minutes Intravenous Every 12 hours 05/16/24 1616 05/18/24 2359   05/14/24 1800  cefTRIAXone  (ROCEPHIN ) 2 g in sodium chloride  0.9 % 100 mL IVPB        2 g 200 mL/hr over 30 Minutes Intravenous Every 24 hours 05/14/24 0536 05/18/24 1808   05/14/24 0630  doxycycline  (VIBRAMYCIN ) 100 mg in sodium chloride  0.9 % 250 mL IVPB  Status:  Discontinued        100 mg 125 mL/hr over 120 Minutes Intravenous Every 12 hours 05/14/24 0532 05/16/24 1616   05/13/24 1815  cefTRIAXone  (ROCEPHIN ) 1 g in sodium chloride  0.9 % 100 mL IVPB        1 g 200 mL/hr over 30 Minutes Intravenous  Once 05/13/24 1808 05/13/24 1926   05/13/24 1815  azithromycin  (ZITHROMAX ) 500 mg in sodium chloride  0.9 % 250 mL IVPB        500 mg 250 mL/hr over 60 Minutes Intravenous  Once 05/13/24 1808 05/13/24 2049          Medications  apixaban   2.5 mg Oral BID   aspirin  EC  81 mg Oral Daily   Chlorhexidine  Gluconate Cloth  6 each Topical Daily   empagliflozin   10 mg Oral Daily   furosemide   40 mg Oral Daily   insulin  aspart  0-9 Units Subcutaneous TID WC   levothyroxine   50 mcg Oral QAC breakfast   mirabegron  ER  25 mg Oral Daily   rosuvastatin   40  mg Oral QHS   sodium chloride  flush  10-40 mL Intracatheter Q12H      Subjective:   Kirk [name] was seen and examined today.  Diarrhea resolved, feeling better, no acute chest pain, nausea vomiting, fevers or chills.  Overall improving.   Objective:   Vitals:   05/19/24 0055 05/19/24 0346 05/19/24 0452 05/19/24 0837  BP: 113/72  (!) 115/53 125/61  Pulse:   ROLLEN)  52 60  Resp: 19  20 20   Temp: 98.5 F (36.9 C)  97.7 F (36.5 C) (!) 97.5 F (36.4 C)  TempSrc: Oral  Oral Axillary  SpO2: 96%  96% 100%  Weight:  94.6 kg    Height:        Intake/Output Summary (Last 24 hours) at 05/19/2024 1155 Last data filed at 05/19/2024 1136 Gross per 24 hour  Intake 120 ml  Output 2100 ml  Net -1980 ml     Wt Readings from Last 3 Encounters:  05/19/24 94.6 kg  04/07/24 99.2 kg  02/04/24 99.2 kg    Physical Exam General: Alert and oriented x 3, NAD Cardiovascular: S1 S2 clear, RRR.  Apical systolic murmur Respiratory: CTAB, no wheezing Gastrointestinal: Soft, nontender, nondistended, NBS Ext: no pedal edema bilaterally Neuro: no new deficits Psych: Normal affect    Data Reviewed:  I have personally reviewed following labs    CBC Lab Results  Component Value Date   WBC 8.6 05/19/2024   RBC 5.08 05/19/2024   HGB 15.4 05/19/2024   HCT 47.4 05/19/2024   MCV 93.3 05/19/2024   MCH 30.3 05/19/2024   PLT 169 05/19/2024   MCHC 32.5 05/19/2024   RDW 15.1 05/19/2024   LYMPHSABS 0.8 05/14/2024   MONOABS 1.3 (H) 05/14/2024   EOSABS 0.1 05/14/2024   BASOSABS 0.0 05/14/2024     Last metabolic panel Lab Results  Component Value Date   NA 138 05/19/2024   K 4.0 05/19/2024   CL 102 05/19/2024   CO2 25 05/19/2024   BUN 22 05/19/2024   CREATININE 1.04 05/19/2024   GLUCOSE 124 (H) 05/19/2024   GFRNONAA >60 05/19/2024   GFRAA >60 01/08/2018   CALCIUM  8.9 05/19/2024   PHOS 3.6 05/19/2024   PROT 7.0 05/14/2024   ALBUMIN  3.2 (L) 05/19/2024   LABGLOB 2.8 08/21/2023   BILITOT  1.2 05/14/2024   ALKPHOS 61 05/14/2024   AST 35 05/14/2024   ALT 25 05/14/2024   ANIONGAP 11 05/19/2024    CBG (last 3)  Recent Labs    05/18/24 2055 05/19/24 0605 05/19/24 1126  GLUCAP 141* 104* 128*      Coagulation Profile: No results for input(s): INR, PROTIME in the last 168 hours.   Radiology Studies: I have personally reviewed the imaging studies  No results found.      Nydia Distance M.D. [facility] 05/19/2024, 11:55 AM  Available via Epic secure chat 7am-7pm After 7 pm, please refer to night coverage provider listed on amion.

## 2024-05-19 NOTE — Progress Notes (Signed)
 Mobility Specialist Progress Note:    05/19/24 1341  Mobility  Activity Ambulated independently  Level of Assistance Independent  Assistive Device None  Distance Ambulated (ft) 400 ft  Activity Response Tolerated well  Mobility Referral Yes  Mobility visit 1 Mobility  Mobility Specialist Start Time (ACUTE ONLY) 1330  Mobility Specialist Stop Time (ACUTE ONLY) 1340  Mobility Specialist Time Calculation (min) (ACUTE ONLY) 10 min   Pt received ambulating from bathroom, agreeable to mobility session in hallway. Tolerated well, asx throughout. Returned pt to room, all needs met.  Laretta Pyatt Mobility Specialist Please contact via Special educational needs teacher or  Rehab office at [phone]

## 2024-05-20 ENCOUNTER — Other Ambulatory Visit (HOSPITAL_COMMUNITY): Payer: Self-pay

## 2024-05-20 DIAGNOSIS — I2782 Chronic pulmonary embolism: Secondary | ICD-10-CM

## 2024-05-20 DIAGNOSIS — I2602 Saddle embolus of pulmonary artery with acute cor pulmonale: Secondary | ICD-10-CM | POA: Diagnosis not present

## 2024-05-20 DIAGNOSIS — J9601 Acute respiratory failure with hypoxia: Secondary | ICD-10-CM | POA: Diagnosis not present

## 2024-05-20 DIAGNOSIS — I509 Heart failure, unspecified: Secondary | ICD-10-CM | POA: Diagnosis not present

## 2024-05-20 DIAGNOSIS — I5033 Acute on chronic diastolic (congestive) heart failure: Secondary | ICD-10-CM | POA: Diagnosis not present

## 2024-05-20 LAB — RENAL FUNCTION PANEL
Albumin: 3.4 g/dL — ABNORMAL LOW (ref 3.5–5.0)
Anion gap: 10 (ref 5–15)
BUN: 19 mg/dL (ref 8–23)
CO2: 27 mmol/L (ref 22–32)
Calcium: 9.1 mg/dL (ref 8.9–10.3)
Chloride: 98 mmol/L (ref 98–111)
Creatinine, Ser: 1.07 mg/dL (ref 0.61–1.24)
GFR, Estimated: >60 mL/min (ref >60)
Glucose, Bld: 117 mg/dL — ABNORMAL HIGH (ref 70–99)
Phosphorus: 3.1 mg/dL (ref 2.5–4.6)
Potassium: 3.9 mmol/L (ref 3.5–5.1)
Sodium: 135 mmol/L (ref 135–145)

## 2024-05-20 LAB — COOXEMETRY PANEL
Carboxyhemoglobin: 1.3 % (ref 0.5–1.5)
Methemoglobin: <0.7 % (ref 0.0–1.5)
O2 Saturation: 65.2 %
Total hemoglobin: 16.4 g/dL — ABNORMAL HIGH (ref 12.0–16.0)

## 2024-05-20 LAB — CBC
HCT: 48.5 % (ref 39.0–52.0)
Hemoglobin: 15.8 g/dL (ref 13.0–17.0)
MCH: 30.1 pg (ref 26.0–34.0)
MCHC: 32.6 g/dL (ref 30.0–36.0)
MCV: 92.4 fL (ref 80.0–100.0)
Platelets: 153 K/uL (ref 150–400)
RBC: 5.25 MIL/uL (ref 4.22–5.81)
RDW: 15 % (ref 11.5–15.5)
WBC: 8 K/uL (ref 4.0–10.5)
nRBC: 0 % (ref 0.0–0.2)

## 2024-05-20 LAB — GLUCOSE, CAPILLARY
Glucose-Capillary: 107 mg/dL — ABNORMAL HIGH (ref 70–99)
Glucose-Capillary: 127 mg/dL — ABNORMAL HIGH (ref 70–99)
Glucose-Capillary: 89 mg/dL (ref 70–99)

## 2024-05-20 LAB — MAGNESIUM: Magnesium: 2.1 mg/dL (ref 1.7–2.4)

## 2024-05-20 MED ORDER — MEXILETINE HCL 200 MG PO CAPS
200.0000 mg | ORAL_CAPSULE | Freq: Two times a day (BID) | ORAL | Status: DC
  Administered 2024-05-20: 200 mg via ORAL
  Filled 2024-05-20 (×2): qty 1

## 2024-05-20 MED ORDER — POTASSIUM CHLORIDE CRYS ER 20 MEQ PO TBCR
20.0000 meq | EXTENDED_RELEASE_TABLET | Freq: Once | ORAL | Status: AC
  Administered 2024-05-20: 20 meq via ORAL
  Filled 2024-05-20: qty 1

## 2024-05-20 MED ORDER — FUROSEMIDE 20 MG PO TABS
20.0000 mg | ORAL_TABLET | Freq: Every day | ORAL | 3 refills | Status: DC
  Filled 2024-05-20 – 2024-06-12 (×3): qty 30, 30d supply, fill #0
  Filled 2024-07-07 – 2024-07-12 (×2): qty 30, 30d supply, fill #1

## 2024-05-20 MED ORDER — FUROSEMIDE 20 MG PO TABS
20.0000 mg | ORAL_TABLET | Freq: Every day | ORAL | Status: DC
  Administered 2024-05-20: 20 mg via ORAL
  Filled 2024-05-20: qty 1

## 2024-05-20 MED ORDER — MEXILETINE HCL 200 MG PO CAPS
200.0000 mg | ORAL_CAPSULE | Freq: Two times a day (BID) | ORAL | 3 refills | Status: DC
  Filled 2024-05-20 – 2024-06-12 (×3): qty 60, 30d supply, fill #0
  Filled 2024-07-07 – 2024-07-12 (×2): qty 60, 30d supply, fill #1

## 2024-05-20 NOTE — Discharge Summary (Signed)
 Physician Discharge Summary   Patient: Kirk [name] MRN: [id number] DOB: 04-16-1946  Admit date:     05/13/2024  Discharge date: 05/20/24  Discharge Physician: Nydia Distance, MD    PCP: [name], [name], MD   Recommendations at discharge:   Continue Eliquis  2.5 mg twice daily, Lasix  20 mg daily Mexiletine 200 mg p.o. twice daily Per cardiology, Likely follow up in [facility] clinic for leaflet modification and TAVR. Dr Wendel arranging.   Discharge Diagnoses:    Acute hypoxemic respiratory failure (HCC) Acute on chronic diastolic CHF Pulmonary hypertension, WHO group II Moderate to severe mitral regurgitation History of saddle pulmonary embolus (HCC) Acute bronchitis, ?  Community-acquired pneumonia   S/P AVR (aortic valve replacement)   S/P CABG x 1   History of anal cancer   Right leg DVT (HCC)   Chronic kidney disease, stage 3a (HCC)   Essential hypertension   Hypothyroidism   Type 2 diabetes mellitus (HCC)   Overactive bladder    Stenosis of prosthetic aortic valve   Hospital Course:  Patient is a 78 year old male with bioprosthetic aortic valve replacement, CAD s/p CABG, hypertension, diabetes mellitus type 2, history of anal cancer, prior history of DVT and saddle embolism on Eliquis  was brought to the ER after patient was having worsening shortness of breath.  Patient had come to the ER about 3 days ago with shortness of breath with cough and wheezing.  At that time patient was diagnosed with possible pneumonia and bronchitis was discharged home on antibiotics and prednisone .  Due to worsening shortness of breath patient presents back to the ER.  Denied any chest pain or abdominal pain.   ED Course: In the ER patient was acutely short of breath and had to be placed on BiPAP.  Chest x-ray shows features concerning for fluid overload was given Lasix  40 mg IV.  Troponin remained flat.  On exam patient was diffusely wheezing, was placed on nebulizer therapy.  COVID flu test  were negative respiratory panel was  negative 2 days ago. proBNP was 2400 troponins were 57 and 52.  WBC count was 16.1   Assessment and Plan:     Acute hypoxemic respiratory failure (HCC) Acute on chronic diastolic CHF - Initially placed on BiPAP in ED, improved after Lasix  IV 40 mg x 1 and nebulizer treatments - Multifactorial likely due to acute CHF versus acute bronchitis with PNA, history of PE, BNP 2462 - 2D echo showed EF 70 to 75%, severe asymmetric LVH of the basal septal segment, left ventricular diastolic function could not be evaluated, degenerative mitral valve, trivial MR, moderate mitral annular calcification, repaired aortic valve, trivial AR, moderate AS. 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, preserved RV function, significant volume excess, - Patient was placed on IV Lasix .  Follow closely cardiology, on discharge recommending Lasix  20 mg daily.  No beta-blocker for now with low output.  Continue Eliquis , Farxiga . CO-OX remains stable.    Severe bioprosthetic aortic valve stenosis - Cardiology recommendations reviewed and noted severe bioprosthetic aortic valve stenosis, CT showed high risk anatomy, not amenable for TAVR.  Evaluated by Dr. Wendel on 9/13, plan outpatient workup, likely follow up in [facility] clinic for leaflet modification and TAVR, Dr. Wendel is arranging.   Acute bronchitis/pneumonia - Chest  x-ray shows no dense consolidation, favors pulmonary edema - Completed IV antibiotics     CAD status post CABG, HTN -Currently no chest pain, troponins flat, continue eliquis , beta-blocker, statin - 2D echo showed EF 70 to 75%, severe asymmetric LVH of the basal septal segment, left ventricular diastolic function could not be  evaluated, degenerative mitral valve, trivial MR, moderate mitral annular calcification, repaired aortic valve, trivial AR, moderate AS. - Cardiac cath with no coronary artery disease     History of saddle PE, on anticoagulation - Eliquis  resumed   Mild acute kidney injury - Improving, creatinine stable   Diabetes mellitus type 2 - On Ozempic  and Farxiga .  Hemoglobin A1c 5.9    Hypothyroidism - Continue Synthroid    Diarrhea - C. difficile negative, resolved     Obesity class I Estimated body mass index is 29.08 kg/m as calculated from the following:   Height as of this encounter: 5' 11 (1.803 m).   Weight as of this encounter: 94.6 kg.       Pain control - [place]  Controlled Substance Reporting System database was reviewed. and patient was instructed, not to drive, operate heavy machinery, perform activities at heights, swimming or participation in water  activities or provide baby-sitting services while on Pain, Sleep and Anxiety Medications; until their outpatient Physician has advised to do so again. Also recommended to not to take more than prescribed Pain, Sleep and Anxiety Medications.  Consultants: Cardiology, Procedures performed:   Disposition: Home Diet recommendation:  Discharge Diet Orders (From admission, onward)     Start     Ordered   05/20/24 0000  Diet - low sodium heart healthy        05/20/24 1257            DISCHARGE MEDICATION: Allergies as of 05/20/2024       Reactions   Cat Dander Shortness Of Breath, Swelling   Hydrocodone  Other (See Comments)   Seizures    Atorvastatin  Other (See Comments)   Joint pain   Dust Mite Extract Other (See Comments), Swelling   Metformin Other (See Comments)   Diarrhea, muscle cramps   Oxycodone  Other (See Comments)   Seizure PATIENT DOES NOT WANT ANY NARCOTICS   Semaglutide (0.25 Or 0.5mg -dos) Nausea Only   AT HIGHER DOSE        Medication List     STOP taking these medications     benzonatate  100 MG capsule Commonly known as: TESSALON    losartan  50 MG tablet Commonly known as: COZAAR    metoprolol  tartrate 25 MG tablet Commonly known as: LOPRESSOR    ondansetron  4 MG tablet Commonly known as: ZOFRAN    predniSONE  20 MG tablet Commonly known as: DELTASONE        TAKE these medications    aspirin  EC 81 MG tablet Take 81 mg by mouth daily.   DULoxetine  60 MG capsule Commonly known as: CYMBALTA  Take 1 capsule by mouth at bedtime   Eliquis  2.5 MG Tabs tablet Generic drug: apixaban  Take 1 tablet by mouth twice daily   Farxiga  5 MG Tabs tablet Generic drug: dapagliflozin  propanediol Take 1 tablet by mouth once daily   furosemide  20 MG tablet Commonly known as: LASIX  Take 1 tablet (20 mg total) by mouth daily. Start taking on: May 21, 2024   levothyroxine  50 MCG tablet Commonly known as: SYNTHROID  Take 1 tablet (50 mcg total) by mouth daily before breakfast.   Lipo-Flavonoid Plus Tabs Take 1 tablet by mouth daily.   mexiletine 200 MG capsule  Commonly known as: MEXITIL  Take 1 capsule (200 mg total) by mouth every 12 (twelve) hours.   Myrbetriq  25 MG Tb24 tablet Generic drug: mirabegron  ER Take 1 tablet (25 mg total) by mouth daily.   NEEDLE (DISP) 22 G 22G X 1-1/2 Misc 1 each by Does not apply route once a week. Use to administer medication.   Ozempic  (0.25 or 0.5 MG/DOSE) 2 MG/3ML Sopn Generic drug: Semaglutide (0.25 or 0.5MG /DOS) INJECT 0.25MG   SUBCUTANEOUSLY ONCE A WEEK   rosuvastatin  40 MG tablet Commonly known as: CRESTOR  TAKE 1 TABLET BY MOUTH AT BEDTIME   tadalafil  20 MG tablet Commonly known as: CIALIS  Take 20 mg by mouth daily as needed for erectile dysfunction.   triamcinolone  cream 0.1 % Commonly known as: KENALOG  APPLY CREAM EXTERNALLY TO AFFECTED AREA TWICE DAILY AS NEEDED FOR ITCHING   zinc sulfate (50mg  elemental zinc) 220 (50 Zn) MG capsule Take 220 mg by mouth daily.        Follow-up Information       [facility]. Go on 05/26/2024.   Specialty: Cardiology Why: Hospital Follow up 05/26/24 @ 3:40  am PLEASE bring a current medication list to appointment FREE valet parking, Entrance C, off [employer] [address] and [facility] entrance Contact information: [address] [place]  [address] [phone]        [name], [name], MD Follow up.   Specialty: Family Medicine Why: please arrange hospital follow up in 7-14 days Contact information: [address] [place] [address] [phone]                Discharge Exam: Filed Weights   05/18/24 0307 05/19/24 0346 05/20/24 0552  Weight: 94.3 kg 94.6 kg 93.5 kg   S: No acute complaints, feeling better, cleared by cardiology to discharge home  BP 121/77 (BP Location: Left Arm)   Pulse 71   Temp 98.7 F (37.1 C) (Oral)   Resp 16   Ht 5' 11 (1.803 m)   Wt 93.5 kg   SpO2 98%   BMI 28.75 kg/m   Physical Exam General: Alert and oriented x 3, NAD Cardiovascular: S1 S2 clear, RRR.  Respiratory: CTAB, no wheezing Gastrointestinal: Soft, nontender, nondistended, NBS Ext: no pedal edema bilaterally Neuro: no new deficits Psych: Normal affect    Condition at discharge: fair  The results of significant diagnostics from this hospitalization (including imaging, microbiology, ancillary and laboratory) are listed below for reference.   Imaging Studies: US  EKG SITE RITE Result Date: 05/17/2024 If Site Rite image not attached, placement could not be confirmed due to current cardiac rhythm.  CT CORONARY MORPH W/CTA COR W/SCORE W/CA W/CM &/OR WO/CM Addendum Date: 05/16/2024 ADDENDUM REPORT: 05/16/2024 16:50 EXAM: OVER-READ INTERPRETATION  CT CHEST The following report is an over-read performed by radiologist Dr. Lynwood Seip [facility] on 05/16/2024. This over-read does not include interpretation of cardiac or  coronary anatomy or pathology. The coronary CTA interpretation by the cardiologist is attached. COMPARISON:  None. FINDINGS: The visualized portions of the extracardiac vascular structures of the chest are unremarkable. Visualized mediastinum is unremarkable. Visualized portion of upper abdomen is unremarkable. Small right pleural effusion is noted with minimal adjacent subsegmental atelectasis. Patchy airspace opacities are seen in both upper lobes concerning for multifocal pneumonia. Visualized skeleton is unremarkable. IMPRESSION: Small right pleural effusion is noted with minimal adjacent subsegmental atelectasis. Patchy airspace opacities are seen in both upper lobes concerning for multifocal pneumonia.  Electronically Signed   By: Lynwood Landy Raddle M.D.   On: 05/16/2024 16:50   Result Date: 05/16/2024 CLINICAL DATA:  Aortic Valve pathology with assessment for TAVR (prior SAVR) EXAM: Cardiac TAVR CT TECHNIQUE: The patient was scanned on a Siemens Force 192 slice scanner. A 120 kV retrospective scan was triggered in the descending thoracic aorta at 111 HU's. Gantry rotation speed was 270 msecs and collimation was .9 mm. No beta blockade or nitro were given. The 3D data set was reconstructed in 5% intervals of the R-R cycle. Systolic and diastolic phases were analyzed on a dedicated work station using MPR, MIP and VRT modes. The patient received 100 cc of contrast. FINDINGS: Prosthetic Valve: 25 mm Edwards Magna Ease Pericardial Tissue Valve (implanted 08/16/2011). Hypoattenuation and degree: All three prosthetic leaflets are prominently calcified. In cine evaluation, the non and the left leaflet are significantly hypokinetic and restricted. Normal motion of the right leaflet. Mild annular hypoattenuation. Annular calcification: Severe- large protruding LVOT/subannular calcification. The intervalvular fibrosa is heavily calcified. Mitral Valve: The is anterior leaflet restriction that arises from large anterior  annular calcification that extends to restriction the A2 leaflet. Mild posterior annular calcification. Presence of basal septal hypertrophy: Severe, 16 mm Perimembranous septal diameter: 4 mm Aortic Measurements- 65% phase due to artifact Sinotubular Junction: 29 mm. Adventitial thickening noted. This may be related to post-operative changes. Ascending Thoracic Aorta: 36 mm Descending Thoracic Aorta: 26 mm Optimum Fluoroscopic Angle for Delivery: LAO 5, CAU 4 Left Main Coronary angle: RAO 0, CAU 11 Valves for structural team consideration (with Valve in Valve App Support): 26 mm Sapien is favored. Left main virtual Valve to coronary distance: 4 mm Right coronary virtual Valve to coronary distance: 5 mm Evolut Left main virtual Valve to coronary distance: 3 mm Right coronary virtual Valve to coronary distance:4 mm In the setting of severe subannular calcification, study assumes a non-fractured valve strategy. Left main coronary height 7 mm. Left SOV height 14 mm. Estimated left prosthetic leaflet length 14 mm. Recommend heart team meeting for left coronary system deployment planning. Right coronary height 8 mm. Right SOV height 15 mm. Non TAVR Valve Findings: Coronary Arteries: Normal coronary origin. Study not completed with nitroglycerin . Suboptimal coronary assessment due to motion. Suggests clinical correlation to 05/15/24 heart catheterization. Systemic veins: Normal caliber Main Pulmonary artery: Normal caliber Pulmonary veins: Normal anatomy. Left atrial appendage: Patent. Interatrial septum: Lipomatous interatrial septal hypertrophy noted without a left to right communication. Chamber dimensions: Left atrial dilation. Pericardium: No calcification. Extra Cardiac Findings as per separate reporting. Notable artifacts: Slab and motion artifact; this affect the diastolic assessment of the annulus. Image quality: Poor IMPRESSION: 1. Prosthetic aortic stenosis. Findings pertinent to TAVR procedure are detailed  above. RECOMMENDATIONS: The proposed cut-off value of 1,651 AU yielded a 93 % sensitivity and 75 % specificity in grading AS severity in patients with classical low-flow, low-gradient AS. Proposed different cut-off values to define severe AS for men and women as 2,065 AU and 1,274 AU, respectively. The joint European and American recommendations for the assessment of AS consider the aortic valve calcium  score as a continuum - a very high calcium  score suggests severe AS and a low calcium  score suggests severe AS is unlikely. Donney VEAR Jarome LULLA Stephen RENETTE, et al. 2017 ESC/EACTS Guidelines for the management of valvular heart disease. Eur Heart J [phone] Coronary artery calcium  (CAC) score is a strong predictor of incident coronary heart disease (CHD) and provides predictive information beyond traditional risk  factors. CAC scoring is reasonable to use in the decision to withhold, postpone, or initiate statin therapy in intermediate-risk or selected borderline-risk asymptomatic adults (age 68-75 years and LDL-C >=70 to <190 mg/dL) who do not have diabetes or established atherosclerotic cardiovascular disease (ASCVD).* In intermediate-risk (10-year ASCVD risk >=7.5% to <20%) adults or selected borderline-risk (10-year ASCVD risk >=5% to <7.5%) adults in whom a CAC score is measured for the purpose of making a treatment decision the following recommendations have been made: If CAC = 0, it is reasonable to withhold statin therapy and reassess in 5 to 10 years, as long as higher risk conditions are absent (diabetes mellitus, family history of premature CHD in first degree relatives (males <55 years; females <65 years), cigarette smoking, LDL >=190 mg/dL or other independent risk factors). If CAC is 1 to 99, it is reasonable to initiate statin therapy for patients >=53 years of age. If CAC is >=100 or >=75th percentile, it is reasonable to initiate statin therapy at any age. Cardiology referral should be considered  for patients with CAC scores >=400 or >=75th percentile. *2018 AHA/ACC/AACVPR/AAPA/ABC/ACPM/ADA/AGS/APhA/ASPC/NLA/PCNA Guideline on the Management of Blood Cholesterol: A Report of the American College of Cardiology/American Heart Association Task Force on Clinical Practice Guidelines. J Am Coll Cardiol. 2019;73(24):3168-3209. Mahesh  Chandrasekhar Electronically Signed: By: Stanly Leavens M.D. On: 05/16/2024 16:42   CT Angio Abd/Pel w/ and/or w/o Result Date: 05/16/2024 CLINICAL DATA:  Preop for transcatheter aortic valve repair. EXAM: CT ANGIOGRAPHY CHEST, ABDOMEN AND PELVIS TECHNIQUE: Non-contrast CT of the chest was initially obtained. Multidetector CT imaging through the chest, abdomen and pelvis was performed using the standard protocol during bolus administration of intravenous contrast. Multiplanar reconstructed images and MIPs were obtained and reviewed to evaluate the vascular anatomy. RADIATION DOSE REDUCTION: This exam was performed according to the departmental dose-optimization program which includes automated exposure control, adjustment of the mA and/or kV according to patient size and/or use of iterative reconstruction technique. CONTRAST:  OMNIPAQUE  IOHEXOL  350 MG/ML SOLN COMPARISON:  April 25, 2022. FINDINGS: CTA CHEST FINDINGS Cardiovascular: Status post aortic valve repair. There is no evidence of thoracic aortic dissection or aneurysm. Status post coronary bypass graft. Great vessels are widely patent without stenosis. Mediastinum/Nodes: Esophagus is unremarkable. Thyroid  gland is unremarkable. Calcified mediastinal lymph nodes are again noted suggesting prior granulomatous disease. Lungs/Pleura: Small right pleural effusion is noted with minimal adjacent subsegmental atelectasis. Patchy airspace opacities are noted throughout both lungs most consistent with multifocal pneumonia. Musculoskeletal: No chest wall abnormality. No acute or significant osseous findings. Review of  the MIP images confirms the above findings. CTA ABDOMEN AND PELVIS FINDINGS VASCULAR Aorta: Atherosclerosis of abdominal aorta is noted without aneurysm or dissection. Celiac: Patent without evidence of aneurysm, dissection, vasculitis or significant stenosis. SMA: Moderate narrowing is noted at origin secondary to calcified plaque. Renals: Both renal arteries are patent without evidence of aneurysm, dissection, vasculitis, fibromuscular dysplasia or significant stenosis. IMA: Severe narrowing is noted at origin secondary to calcified plaque. Inflow: Patent without evidence of aneurysm, dissection, vasculitis or significant stenosis. Veins: No obvious venous abnormality within the limitations of this arterial phase study. Review of the MIP images confirms the above findings. NON-VASCULAR Hepatobiliary: Minimal cholelithiasis. No biliary dilatation. Liver is unremarkable. Pancreas: Unremarkable. No pancreatic ductal dilatation or surrounding inflammatory changes. Spleen: Normal in size without focal abnormality. Adrenals/Urinary Tract: Adrenal glands are unremarkable. Kidneys are normal, without renal calculi, focal lesion, or hydronephrosis. Bladder is unremarkable. Stomach/Bowel: Stomach is within normal limits. Appendix  appears normal. No evidence of bowel wall thickening, distention, or inflammatory changes. Lymphatic: No adenopathy is noted. Reproductive: Prostate is unremarkable. Other: No abdominal wall hernia or abnormality. No abdominopelvic ascites. Musculoskeletal: No acute or significant osseous findings. Review of the MIP images confirms the above findings. IMPRESSION: 1. Status post aortic valve repair. No evidence of thoracic or abdominal aortic dissection or aneurysm. 2. Small right pleural effusion is noted with minimal adjacent subsegmental atelectasis. 3. Patchy airspace opacities are noted throughout both lungs most consistent with multifocal pneumonia. 4. Minimal cholelithiasis. 5. Aortic  atherosclerosis. Aortic Atherosclerosis (ICD10-I70.0). Electronically Signed   By: Lynwood Landy Raddle M.D.   On: 05/16/2024 15:54   CT ANGIO CHEST AORTA W/CM & OR WO/CM Result Date: 05/16/2024 CLINICAL DATA:  Preop for transcatheter aortic valve repair. EXAM: CT ANGIOGRAPHY CHEST, ABDOMEN AND PELVIS TECHNIQUE: Non-contrast CT of the chest was initially obtained. Multidetector CT imaging through the chest, abdomen and pelvis was performed using the standard protocol during bolus administration of intravenous contrast. Multiplanar reconstructed images and MIPs were obtained and reviewed to evaluate the vascular anatomy. RADIATION DOSE REDUCTION: This exam was performed according to the departmental dose-optimization program which includes automated exposure control, adjustment of the mA and/or kV according to patient size and/or use of iterative reconstruction technique. CONTRAST:  OMNIPAQUE  IOHEXOL  350 MG/ML SOLN COMPARISON:  April 25, 2022. FINDINGS: CTA CHEST FINDINGS Cardiovascular: Status post aortic valve repair. There is no evidence of thoracic aortic dissection or aneurysm. Status post coronary bypass graft. Great vessels are widely patent without stenosis. Mediastinum/Nodes: Esophagus is unremarkable. Thyroid  gland is unremarkable. Calcified mediastinal lymph nodes are again noted suggesting prior granulomatous disease. Lungs/Pleura: Small right pleural effusion is noted with minimal adjacent subsegmental atelectasis. Patchy airspace opacities are noted throughout both lungs most consistent with multifocal pneumonia. Musculoskeletal: No chest wall abnormality. No acute or significant osseous findings. Review of the MIP images confirms the above findings. CTA ABDOMEN AND PELVIS FINDINGS VASCULAR Aorta: Atherosclerosis of abdominal aorta is noted without aneurysm or dissection. Celiac: Patent without evidence of aneurysm, dissection, vasculitis or significant stenosis. SMA: Moderate narrowing is noted at  origin secondary to calcified plaque. Renals: Both renal arteries are patent without evidence of aneurysm, dissection, vasculitis, fibromuscular dysplasia or significant stenosis. IMA: Severe narrowing is noted at origin secondary to calcified plaque. Inflow: Patent without evidence of aneurysm, dissection, vasculitis or significant stenosis. Veins: No obvious venous abnormality within the limitations of this arterial phase study. Review of the MIP images confirms the above findings. NON-VASCULAR Hepatobiliary: Minimal cholelithiasis. No biliary dilatation. Liver is unremarkable. Pancreas: Unremarkable. No pancreatic ductal dilatation or surrounding inflammatory changes. Spleen: Normal in size without focal abnormality. Adrenals/Urinary Tract: Adrenal glands are unremarkable. Kidneys are normal, without renal calculi, focal lesion, or hydronephrosis. Bladder is unremarkable. Stomach/Bowel: Stomach is within normal limits. Appendix appears normal. No evidence of bowel wall thickening, distention, or inflammatory changes. Lymphatic: No adenopathy is noted. Reproductive: Prostate is unremarkable. Other: No abdominal wall hernia or abnormality. No abdominopelvic ascites. Musculoskeletal: No acute or significant osseous findings. Review of the MIP images confirms the above findings. IMPRESSION: 1. Status post aortic valve repair. No evidence of thoracic or abdominal aortic dissection or aneurysm. 2. Small right pleural effusion is noted with minimal adjacent subsegmental atelectasis. 3. Patchy airspace opacities are noted throughout both lungs most consistent with multifocal pneumonia. 4. Minimal cholelithiasis. 5. Aortic atherosclerosis. Aortic Atherosclerosis (ICD10-I70.0). Electronically Signed   By: Lynwood Landy Raddle M.D.   On: 05/16/2024 15:54  ECHO TEE Result Date: 05/15/2024    TRANSESOPHOGEAL ECHO REPORT   Patient Name:   Kirk [name] Date of Exam: 05/15/2024 Medical Rec #:  [id number]      Height:       71.0  in Accession #:    [id number]     Weight:       218.1 lb Date of Birth:  1946/01/07      BSA:          2.188 m Patient Age:    78 years       BP:           132/76 mmHg Patient Gender: M              HR:           69 bpm. Exam Location:  Inpatient Procedure: Transesophageal Echo, 3D Echo, Cardiac Doppler and Color Doppler            (Both Spectral and Color Flow Doppler were utilized during            procedure). Indications:     Mitral Stenosis i34.1  History:         Patient has prior history of Echocardiogram examinations, most                  recent 05/14/2024. CHF, Prior CABG; Risk Factors:Hypertension                  and Diabetes.                  Aortic Valve: Magna bioprosthetic valve is present in the                  aortic position. Procedure Date: 08/16/2011.  Sonographer:     Damien Senior RDCS Referring Phys:  [id number] LEONTINE SAILOR LOCKWOOD Diagnosing Phys: Stanly Leavens MD PROCEDURE: After discussion of the risks and benefits of a TEE, an informed consent was obtained from the patient. TEE procedure time was 35 minutes. The transesophogeal probe was passed without difficulty through the esophogus of the patient. Imaged were obtained with the patient in a left lateral decubitus position. Sedation performed by different physician. The patient was monitored while under deep sedation. Anesthestetic sedation was provided intravenously by Anesthesiology: 396mg  of Propofol , 100mg  of Lidocaine . Supplementary images were obtained from transthoracic windows as indicated to answer the clinical question. The patient developed no complications during the procedure.  IMPRESSIONS  1. Left ventricular ejection fraction, by estimation, is 60 to 65%. The left ventricle has normal function.  2. Right ventricular systolic function is normal. The right ventricular size is normal.  3. No left atrial/left atrial appendage thrombus was detected.  4. The mitral valve is degenerative. Moderate to severe mitral valve  regurgitation. Mild mitral stenosis. The mean mitral valve gradient is 3.0 mmHg with average heart rate of 60 bpm. Severe mitral annular calcification.  5. The aortic valve has been replaced by a 25 mm Magna Ease. Aortic valve regurgitation is not visualized. Severe aortic valve stenosis. 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 Procedure Date: 08/16/2011. 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 Aortic valve area, by VTI measures 0.69 cm. Aortic valve mean gradient measures 53.5 mmHg. Aortic valve Vmax measures 4.79 m/s. Acceleration time 119 msec. Consider evaluation for prosthetic valve stenosis.  6. 3D performed of the mitral valve and demonstrates 3D assessment of Mitral Valve: There are three jets  of mitral regurgiation at A1P1, A2P2, and A3,P3. Summation of 3D VCA 0.562 (severe) but with only systolic blunting of the pulmonary vein flow. Color flow more prominent at 123/78 mm Hg. 3D MV Area 3.58 cm2. The anterior annular calcification about A2 is protruding and would make mTEER challening, along with baseline MVA.  7. The inferior vena cava is dilated in size with >50% respiratory variability, suggesting right atrial pressure of 8 mmHg. FINDINGS  Left Ventricle: Left ventricular ejection fraction, by estimation, is 60 to 65%. The left ventricle has normal function. The left ventricular internal cavity size was normal in size. Right Ventricle: The right ventricular size is normal. No increase in right ventricular wall thickness. Right ventricular systolic function is normal. Left Atrium: Left atrial size was normal in size. No left atrial/left atrial appendage thrombus was detected. Right Atrium: Right atrial size was normal in size. Pericardium: There is no evidence of pericardial effusion. Mitral Valve: The mitral valve is degenerative in appearance. Severe mitral annular calcification. Moderate to severe mitral valve regurgitation. Mild mitral valve  stenosis. MV peak gradient, 7.1 mmHg. The mean mitral valve gradient is 3.0 mmHg with average heart rate of 60 bpm. Tricuspid Valve: The tricuspid valve is normal in structure. Tricuspid valve regurgitation is trivial. Aortic Valve: The aortic valve has been repaired/replaced. Aortic valve regurgitation is not visualized. Severe aortic stenosis is present. Aortic valve mean gradient measures 53.5 mmHg. Aortic valve peak gradient measures 91.8 mmHg. Aortic valve area, by VTI measures 0.69 cm. 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 Procedure Date: 08/16/2011. Pulmonic Valve: The pulmonic valve was grossly normal. Pulmonic valve regurgitation is not visualized. Aorta: The aortic root and ascending aorta are structurally normal, with no evidence of dilitation. Venous: A systolic blunting flow pattern is recorded from the left upper pulmonary vein. The inferior vena cava is dilated in size with greater than 50% respiratory variability, suggesting right atrial pressure of 8 mmHg. IAS/Shunts: The interatrial septum appears to be lipomatous. No atrial level shunt detected by color flow Doppler. Additional Comments: 3D was performed not requiring image post processing on an independent workstation and was normal. Spectral Doppler performed. LEFT VENTRICLE PLAX 2D LVOT diam:     2.30 cm   Diastology LV SV:         79        LV e' medial:    6.28 cm/s LV SV Index:   36        LV E/e' medial:  22.8 LVOT Area:     4.15 cm  LV e' lateral:   6.07 cm/s                          LV E/e' lateral: 23.6  AORTIC VALVE AV Area (Vmax):    0.68 cm AV Area (Vmean):   0.75 cm AV Area (VTI):     0.69 cm AV Vmax:           479.00 cm/s AV Vmean:          342.500 cm/s AV VTI:            1.135 m AV Peak Grad:      91.8 mmHg AV Mean Grad:      53.5 mmHg LVOT Vmax:         78.20 cm/s LVOT Vmean:        61.500 cm/s LVOT VTI:  0.189 m LVOT/AV VTI ratio: 0.17 MITRAL VALVE MV Area (PHT): 2.55 cm     SHUNTS MV  Area VTI:   2.16 cm     Systemic VTI:  0.19 m MV Peak grad:  7.1 mmHg     Systemic Diam: 2.30 cm MV Mean grad:  3.0 mmHg MV Vmax:       1.33 m/s MV Vmean:      74.2 cm/s MV Decel Time: 297 msec MV E velocity: 143.00 cm/s MV A velocity: 61.10 cm/s MV E/A ratio:  2.34 Stanly Leavens MD Electronically signed by Stanly Leavens MD Signature Date/Time: 05/15/2024/6:03:38 PM    Final    CARDIAC CATHETERIZATION Result Date: 05/15/2024 Images from the original result were not included. Cardiac Catheterization 05/15/24: Hemodynamic data: RA 23/22, mean 18 mmHg. RV 63/3, EDP 23 mmHg. PA 68/30, mean 43 mmHg. PW 33/36, mean 31 mmHg. PA saturation 53%, AO saturation 92%. QP/QS 1.00.  CO 3.43, CI 1.57, markedly reduced.  PAPi 2.1. Angiographic data: Left main nonexistent and has separate ostia for the LAD and CX immediately bifurcate. LAD gives origin to large D3.  The LAD is smooth and normal.  Ends at the apex. LCx: Large-caliber vessel, smooth and normal.  Gives origin to large OM 2-3.  Smooth and normal. RCA: Very large vessel, large PDA and large PL branch, smooth and normal. LIMA to D3: Widely patent. Impression and recommendations: No coronary artery disease by cardiac catheterization.  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  PAPi preserved suggesting preserved RV function.  Significant volume excess still persist. Increased furosemide  to 40 mg twice daily, added Jardiance  10 mg daily.   EP STUDY Result Date: 05/15/2024 See surgical note for result.  DG Chest 2 View Result Date: 05/14/2024 CLINICAL DATA:  CHF EXAM: CHEST - 2 VIEW COMPARISON:  05/13/2024 FINDINGS: Prior median sternotomy and valve replacement. Chronic increased markings throughout the lungs. No acute confluent opacities or effusions. Heart and mediastinal contours are within normal limits. No acute bony abnormality. IMPRESSION: Stable chronic  changes.  No active disease. Electronically Signed   By: Franky Crease M.D.   On: 05/14/2024 18:25   ECHOCARDIOGRAM COMPLETE Result Date: 05/14/2024    ECHOCARDIOGRAM REPORT   Patient Name:   Kirk [name] Date of Exam: 05/14/2024 Medical Rec #:  [id number]      Height:       71.0 in Accession #:    [id number]     Weight:       224.5 lb Date of Birth:  1946/05/14      BSA:          2.215 m Patient Age:    78 years       BP:           120/52 mmHg Patient Gender: M              HR:           81 bpm. Exam Location:  Inpatient Procedure: 2D Echo, Cardiac Doppler and Color Doppler (Both Spectral and Color            Flow Doppler were utilized during procedure). Indications:    I50.40* Unspecified combined systolic (congestive) and diastolic                 (congestive) heart failure  History:        Patient has prior history of Echocardiogram examinations,  most                 recent 10/01/2023. CAD, Prior CABG and Abnormal ECG, Aortic Valve                 Disease, Arrythmias:Atrial Fibrillation, Signs/Symptoms:Dyspnea                 and Shortness of Breath; Risk Factors:Hypertension and Diabetes.                 Pulmonary embolus.                 Aortic Valve: 25 mm Magna valve is present in the aortic                 position. Procedure Date: 2012.  Sonographer:    Ellouise Mose RDCS Referring Phys: [phone] REDIA SAILOR [facility]  Sonographer Comments: Technically difficult study due to poor echo windows. Image acquisition challenging due to patient body habitus. Very difficult to obtain AV gradient. Study interrrupted for PA consult for at least 17 minutes. IMPRESSIONS  1. Left ventricular ejection fraction, by estimation, is 70 to 75%. Left ventricular ejection fraction by 2D MOD biplane is 71.3 %. The left ventricle has hyperdynamic function. The left ventricle has no regional wall motion abnormalities. There is severe asymmetric left ventricular hypertrophy of the basal-septal segment. Left ventricular diastolic function  could not be evaluated.  2. Right ventricular systolic function is low normal. The right ventricular size is normal. There is normal pulmonary artery systolic pressure. The estimated right ventricular systolic pressure is 24.9 mmHg.  3. Left atrial size was moderately dilated.  4. The mitral valve is degenerative. Trivial mitral valve regurgitation. Moderate mitral annular calcification.  5. The aortic valve has been repaired/replaced. Aortic valve regurgitation is trivial. Moderate aortic valve stenosis. There is a 25 mm Magna valve present in the aortic position. Procedure Date: 2012. Echo findings are consistent with normal structure and function of the aortic valve prosthesis. Aortic valve area, by VTI measures 1.36 cm. Aortic valve mean gradient measures 14.0 mmHg. Aortic valve Vmax measures 2.61 m/s.  6. Aortic dilatation noted. There is borderline dilatation of the ascending aorta, measuring 38 mm. Comparison(s): Changes from prior study are noted. 10/01/2023: LVEF 60-65%, bioprosthetic AVR - MG 21 mmHg. FINDINGS  Left Ventricle: Left ventricular ejection fraction, by estimation, is 70 to 75%. Left ventricular ejection fraction by 2D MOD biplane is 71.3 %. The left ventricle has hyperdynamic function. The left ventricle has no regional wall motion abnormalities. The left ventricular internal cavity size was normal in size. There is severe asymmetric left ventricular hypertrophy of the basal-septal segment. Left ventricular diastolic function could not be evaluated due to atrial fibrillation. Left ventricular diastolic function could not be evaluated. Right Ventricle: The right ventricular size is normal. No increase in right ventricular wall thickness. Right ventricular systolic function is low normal. There is normal pulmonary artery systolic pressure. The tricuspid regurgitant velocity is 2.34 m/s,  and with an assumed right atrial pressure of 3 mmHg, the estimated right ventricular systolic pressure is  24.9 mmHg. Left Atrium: Left atrial size was moderately dilated. Right Atrium: Right atrial size was normal in size. Pericardium: There is no evidence of pericardial effusion. Mitral Valve: The mitral valve is degenerative in appearance. Moderate mitral annular calcification. Trivial mitral valve regurgitation. MV peak gradient, 15.3 mmHg. The mean mitral valve gradient is 7.0 mmHg. Tricuspid Valve: The tricuspid valve is grossly normal. Tricuspid valve  regurgitation is trivial. Aortic Valve: The aortic valve has been repaired/replaced. Aortic valve regurgitation is trivial. Moderate aortic stenosis is present. Aortic valve mean gradient measures 14.0 mmHg. Aortic valve peak gradient measures 27.2 mmHg. Aortic valve area, by VTI  measures 1.36 cm. There is a 25 mm Magna valve present in the aortic position. Procedure Date: 2012. Echo findings are consistent with normal structure and function of the aortic valve prosthesis. Pulmonic Valve: The pulmonic valve was normal in structure. Pulmonic valve regurgitation is not visualized. Aorta: Aortic dilatation noted. There is borderline dilatation of the ascending aorta, measuring 38 mm. IAS/Shunts: No atrial level shunt detected by color flow Doppler.  LEFT VENTRICLE PLAX 2D                        Biplane EF (MOD) LVIDd:         4.49 cm         LV Biplane EF:   Left LVIDs:         3.20 cm                          ventricular LV PW:         1.34 cm                          ejection LV IVS:        1.70 cm                          fraction by LVOT diam:     2.38 cm                          2D MOD LV SV:         73                               biplane is LV SV Index:   33                               71.3 %. LVOT Area:     4.45 cm                                Diastology                                LV e' medial:    5.66 cm/s LV Volumes (MOD)               LV E/e' medial:  33.4 LV vol d, MOD    75.2 ml       LV e' lateral:   8.27 cm/s A2C:                           LV  E/e' lateral: 22.9 LV vol d, MOD    83.1 ml A4C: LV vol s, MOD    19.0 ml A2C: LV vol s, MOD    27.2 ml A4C: LV SV MOD A2C:   56.2 ml LV SV MOD A4C:   83.1 ml LV  SV MOD BP:    57.2 ml RIGHT VENTRICLE            IVC RV S prime:     6.85 cm/s  IVC diam: 1.97 cm TAPSE (M-mode): 1.7 cm LEFT ATRIUM             Index        RIGHT ATRIUM           Index LA diam:        4.42 cm 2.00 cm/m   RA Area:     16.50 cm LA Vol (A2C):   62.1 ml 28.04 ml/m  RA Volume:   42.00 ml  18.96 ml/m LA Vol (A4C):   89.7 ml 40.50 ml/m LA Biplane Vol: 78.3 ml 35.35 ml/m  AORTIC VALVE AV Area (Vmax):    1.37 cm AV Area (Vmean):   1.43 cm AV Area (VTI):     1.36 cm AV Vmax:           260.67 cm/s AV Vmean:          172.000 cm/s AV VTI:            0.541 m AV Peak Grad:      27.2 mmHg AV Mean Grad:      14.0 mmHg LVOT Vmax:         80.50 cm/s LVOT Vmean:        55.300 cm/s LVOT VTI:          0.165 m LVOT/AV VTI ratio: 0.30  AORTA Ao Root diam: 3.13 cm Ao Asc diam:  3.72 cm MITRAL VALVE                TRICUSPID VALVE MV Area (PHT): 3.99 cm     TR Peak grad:   21.9 mmHg MV Area VTI:   1.56 cm     TR Vmax:        234.00 cm/s MV Peak grad:  15.3 mmHg MV Mean grad:  7.0 mmHg     SHUNTS MV Vmax:       1.96 m/s     Systemic VTI:  0.16 m MV Vmean:      109.9 cm/s   Systemic Diam: 2.38 cm MV Decel Time: 190 msec MV E velocity: 189.00 cm/s MV A velocity: 63.50 cm/s MV E/A ratio:  2.98 Vinie Maxcy MD Electronically signed by Vinie Maxcy MD Signature Date/Time: 05/14/2024/4:38:49 PM    Final    DG Chest 2 View Result Date: 05/13/2024 CLINICAL DATA:  sob, wheezing and crackles. EXAM: CHEST - 2 VIEW COMPARISON:  05/10/2024. FINDINGS: Low lung volume. Mild to moderate diffuse pulmonary vascular congestion. Bilateral lung fields are otherwise clear. No dense consolidation or lung collapse. There bilateral small pleural effusions, blunting the costophrenic angles. Note is made of elevated right hemidiaphragm. Stable cardio-mediastinal silhouette.  There are surgical staples along the heart border and sternotomy wires, status post CABG (coronary artery bypass graft). No acute osseous abnormalities. Lower cervical spinal fixation hardware noted. The soft tissues are within normal limits. IMPRESSION: Findings favor congestive heart failure/pulmonary edema. Electronically Signed   By: Ree Molt M.D.   On: 05/13/2024 16:16   DG Chest Port 1 View Result Date: 05/10/2024 CLINICAL DATA:  Shortness of breath. EXAM: PORTABLE CHEST 1 VIEW COMPARISON:  March 17, 2022. FINDINGS: Stable cardiomediastinal silhouette. Sternotomy wires are noted. No acute pulmonary disease. Bony thorax is unremarkable. IMPRESSION: No active disease. Electronically Signed   By: Lynwood Landy Mickey CHRISTELLA.D.  On: 05/10/2024 09:32    Microbiology: Results for orders placed or performed during the hospital encounter of 05/13/24  Respiratory (~20 pathogens) panel by PCR     Status: None   Collection Time: 05/13/24  2:25 PM   Specimen: Nasopharyngeal Swab; Respiratory  Result Value Ref Range Status   Adenovirus NOT DETECTED NOT DETECTED Final   Coronavirus 229E NOT DETECTED NOT DETECTED Final    Comment: (NOTE) The Coronavirus on the Respiratory Panel, DOES NOT test for the novel  Coronavirus (2019 nCoV)    Coronavirus HKU1 NOT DETECTED NOT DETECTED Final   Coronavirus NL63 NOT DETECTED NOT DETECTED Final   Coronavirus OC43 NOT DETECTED NOT DETECTED Final   Metapneumovirus NOT DETECTED NOT DETECTED Final   Rhinovirus / Enterovirus NOT DETECTED NOT DETECTED Final   Influenza A NOT DETECTED NOT DETECTED Final   Influenza B NOT DETECTED NOT DETECTED Final   Parainfluenza Virus 1 NOT DETECTED NOT DETECTED Final   Parainfluenza Virus 2 NOT DETECTED NOT DETECTED Final   Parainfluenza Virus 3 NOT DETECTED NOT DETECTED Final   Parainfluenza Virus 4 NOT DETECTED NOT DETECTED Final   Respiratory Syncytial Virus NOT DETECTED NOT DETECTED Final   Bordetella pertussis NOT DETECTED NOT  DETECTED Final   Bordetella Parapertussis NOT DETECTED NOT DETECTED Final   Chlamydophila pneumoniae NOT DETECTED NOT DETECTED Final   Mycoplasma pneumoniae NOT DETECTED NOT DETECTED Final    Comment: Performed at [facility], [address], [place], [place] [address]  C Difficile Quick Screen w PCR reflex     Status: None   Collection Time: 05/15/24  7:44 PM   Specimen: STOOL  Result Value Ref Range Status   C Diff antigen NEGATIVE NEGATIVE Final   C Diff toxin NEGATIVE NEGATIVE Final   C Diff interpretation No C. difficile detected.  Final    Comment: Performed at [facility], [address], [place], [place] [address]    Labs: CBC: Recent Labs  Lab 05/13/24 1425 05/13/24 1435 05/14/24 0754 05/15/24 0640 05/16/24 0313 05/17/24 0903 05/18/24 0541 05/19/24 0504 05/20/24 0548  WBC 16.1*  --  19.5*   < > 12.1* 8.4 8.2 8.6 8.0  NEUTROABS 14.5*  --  17.1*  --   --   --   --   --   --   HGB 15.3   < > 14.4   < > 16.1 17.2* 16.0 15.4 15.8  HCT 49.2   < > 46.3   < > 50.6 54.5* 49.3 47.4 48.5  MCV 95.2  --  96.1   < > 94.8 95.1 92.5 93.3 92.4  PLT 178  --  163   < > 155 176 166 169 153   < > = values in this interval not displayed.   Basic Metabolic Panel: Recent Labs  Lab 05/13/24 1708 05/14/24 0322 05/15/24 0640 05/16/24 0313 05/17/24 0903 05/18/24 0541 05/19/24 0504 05/20/24 0548  NA  --  140   < > 136 136 137 138 135  K  --  5.2*   < > 4.2 5.5* 3.9 4.0 3.9  CL  --  103   < > 98 96* 98 102 98  CO2  --  22   < > 24 23 25 25 27   GLUCOSE  --  153*   < > 95 112* 119* 124* 117*  BUN  --  25*   < > 31* 32* 25* 22 19  CREATININE  --  1.43*   < >  1.20 1.16 1.14 1.04 1.07  CALCIUM   --  8.7*   < > 8.9 9.5 9.1 8.9 9.1  MG 1.8 1.9  --   --   --   --   --  2.1  PHOS  --   --    < > 3.8 4.6 3.9 3.6 3.1   < > = values in this interval not displayed.   Liver Function Tests: Recent Labs  Lab 05/14/24 0322 05/15/24 0640 05/16/24 0313 05/17/24 0903  05/18/24 0541 05/19/24 0504 05/20/24 0548  AST 35  --   --   --   --   --   --   ALT 25  --   --   --   --   --   --   ALKPHOS 61  --   --   --   --   --   --   BILITOT 1.2  --   --   --   --   --   --   PROT 7.0  --   --   --   --   --   --   ALBUMIN  3.7   < > 3.6 3.7 3.4* 3.2* 3.4*   < > = values in this interval not displayed.   CBG: Recent Labs  Lab 05/19/24 1126 05/19/24 1639 05/19/24 2047 05/20/24 0550 05/20/24 1108  GLUCAP 128* 93 127* 107* 127*    Discharge time spent: greater than 30 minutes.  Signed: Nydia Distance, MD [facility] 05/20/2024

## 2024-05-20 NOTE — Telephone Encounter (Signed)
 Pts requesting a c/b in regards to previous phone note.

## 2024-05-20 NOTE — Telephone Encounter (Signed)
 Spoke with wife who is reporting she called [employer] who instructed pt this office would need to call for PA for referral to Dr Selinda Sol, [facility] in [place], [place].  Advised referral will be placed however, typically that office will obtain PA from insurance.  Wife states understanding.  Gave wife information r/t Dr Sol, address and phone number found online.  She was appreciative of the call and information.

## 2024-05-20 NOTE — Progress Notes (Signed)
 [facility] consult for PICC line removal. HOB less than 45*. Held breath upon line removal. Pressure held 5+, no s/sx of bleeding, pressure drsg applied and instructed pt to remain in bed for and to keep drsg CDI for 24hrs. PT and wife VU. Powell Bowler, RN [facility]

## 2024-05-20 NOTE — Telephone Encounter (Signed)
 Wife Cassandra) wants a call back regarding referral from Dr. Wendel to [place]  provider.

## 2024-05-20 NOTE — Progress Notes (Addendum)
 Advanced Heart Failure Rounding Note  Cardiologist: Oneil Parchment, MD  Chief Complaint: Heart Failure  Subjective:   Yesterday diuresed with IV lasix .   Looks great. Denies SOB.    Objective:   Weight Range: 93.5 kg Body mass index is 28.75 kg/m.   Vital Signs:   Temp:  [97.5 F (36.4 C)-98.7 F (37.1 C)] 98.7 F (37.1 C) (09/16 0509) Pulse Rate:  [60-86] 71 (09/16 0552) Resp:  [16-20] 16 (09/16 0509) BP: (103-127)/(61-91) 121/77 (09/16 0509) SpO2:  [95 %-100 %] 98 % (09/16 0552) Weight:  [93.5 kg] 93.5 kg (09/16 0552) Last BM Date : 05/19/24  Weight change: Filed Weights   05/18/24 0307 05/19/24 0346 05/20/24 0552  Weight: 94.3 kg 94.6 kg 93.5 kg    Intake/Output:   Intake/Output Summary (Last 24 hours) at 05/20/2024 0703 Last data filed at 05/19/2024 1637 Gross per 24 hour  Intake --  Output 1750 ml  Net -1750 ml    CVP 1  Physical Exam   General:   No resp difficulty Neck: no JVD.  Cor: Regular rate & rhythm. RUSB murmur Lungs: clear Abdomen: soft, nontender, nondistended.  Extremities: no  edema Neuro: alert & oriented x3   Telemetry   SR   EKG    N/A   Labs    CBC Recent Labs    05/19/24 0504 05/20/24 0548  WBC 8.6 8.0  HGB 15.4 15.8  HCT 47.4 48.5  MCV 93.3 92.4  PLT 169 153   Basic Metabolic Panel Recent Labs    90/84/74 0504 05/20/24 0548  NA 138 135  K 4.0 3.9  CL 102 98  CO2 25 27  GLUCOSE 124* 117*  BUN 22 19  CREATININE 1.04 1.07  CALCIUM  8.9 9.1  PHOS 3.6 3.1   Liver Function Tests Recent Labs    05/19/24 0504 05/20/24 0548  ALBUMIN  3.2* 3.4*   No results for input(s): LIPASE, AMYLASE in the last 72 hours. Cardiac Enzymes No results for input(s): CKTOTAL, CKMB, CKMBINDEX, TROPONINI in the last 72 hours.  BNP: BNP (last 3 results) Recent Labs    05/14/24 1810  BNP 907.6*    ProBNP (last 3 results) Recent Labs    05/10/24 0916 05/13/24 1425  PROBNP 1,252.0* 2,462.0*      D-Dimer No results for input(s): DDIMER in the last 72 hours. Hemoglobin A1C No results for input(s): HGBA1C in the last 72 hours. Fasting Lipid Panel No results for input(s): CHOL, HDL, LDLCALC, TRIG, CHOLHDL, LDLDIRECT in the last 72 hours. Thyroid  Function Tests No results for input(s): TSH, T4TOTAL, T3FREE, THYROIDAB in the last 72 hours.  Invalid input(s): FREET3  Other results:   Imaging    No results found.   Medications:     Scheduled Medications:  apixaban   2.5 mg Oral BID   aspirin  EC  81 mg Oral Daily   Chlorhexidine  Gluconate Cloth  6 each Topical Daily   empagliflozin   10 mg Oral Daily   furosemide   40 mg Oral Daily   insulin  aspart  0-9 Units Subcutaneous TID WC   levothyroxine   50 mcg Oral QAC breakfast   mirabegron  ER  25 mg Oral Daily   potassium chloride   20 mEq Oral Once   rosuvastatin   40 mg Oral QHS   sodium chloride  flush  10-40 mL Intracatheter Q12H    Infusions:    PRN Medications: acetaminophen  **OR** acetaminophen , albuterol , melatonin, mouth rinse, sodium chloride  flush, sodium chloride  flush    Patient Profile  Kirk [name] is a 78 year old with acute on chronic diastolic heart failure in the setting of 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  Assessment/Plan  Severe aortic valve stenosis: Previous surgical valve in 2012. Based on CTA will need coronary leaflet modification prior to procedure.  - Likely follow up in [facility] clinic for leaflet modification and TAVR. Dr Wendel arranging.  -Appears stable. Hold off on RHC.  Acute on chronic diastolic HF Pulmonary hypertension, WHO Group II In the setting of severe valvular disease, appears improved from a HF standpoint. If ambulating well without significant symptoms can hold off on repeat RHC. - CO-OX stable 65%. Euvolemic. CVP 1.  - Will send home on lasix  20 mg daily  NO BB for now with low output.  - Continue jardiance   10mg  daily - Hold off on spiro with hyperkalemia a few days ago.  - Renal function stable.    Mitral regurgitation:  - Moderate to severe, given MAC is a poor clip candidate - Suspect will improve after management of aortic valve disease   CAD - Mild disease, nonobstructive, prior LIMA-D1   PE:  - Prior history -Continue eliquis  2.5 mg twice a day.   We will arrange HF follow up  05/26/24 Eliquis  2.5 mg twice a day  Farxiga  10 mg daily (on PTA)  Lasix  20 mg daily   CO-OX remains stable.   [name], NP  05/20/2024, 7:03 AM  Advanced Heart Failure Team Pager [phone] (M-F; 7a - 5p)  Please contact [facility] for night-coverage after hours (5p -7a ) and weekends on [email or web address]

## 2024-05-20 NOTE — Plan of Care (Signed)
 Problem: Education: Goal: Knowledge of General Education information will improve Description: Including pain rating scale, medication(s)/side effects and non-pharmacologic comfort measures 05/20/2024 0424 by Kirk Odella KIDD, RN Outcome: Progressing 05/20/2024 0424 by Kirk Odella KIDD, RN Outcome: Progressing   Problem: Health Behavior/Discharge Planning: Goal: Ability to manage health-related needs will improve 05/20/2024 0424 by Kirk Odella KIDD, RN Outcome: Progressing 05/20/2024 0424 by Kirk Odella KIDD, RN Outcome: Progressing   Problem: Clinical Measurements: Goal: Ability to maintain clinical measurements within normal limits will improve 05/20/2024 0424 by Kirk Odella KIDD, RN Outcome: Progressing 05/20/2024 0424 by Kirk Odella KIDD, RN Outcome: Progressing Goal: Will remain free from infection 05/20/2024 0424 by Kirk Odella KIDD, RN Outcome: Progressing 05/20/2024 0424 by Kirk Odella KIDD, RN Outcome: Progressing Goal: Diagnostic test results will improve 05/20/2024 0424 by Kirk Odella KIDD, RN Outcome: Progressing 05/20/2024 0424 by Kirk Odella KIDD, RN Outcome: Progressing Goal: Respiratory complications will improve 05/20/2024 0424 by Kirk Odella KIDD, RN Outcome: Progressing 05/20/2024 0424 by Kirk Odella KIDD, RN Outcome: Progressing Goal: Cardiovascular complication will be avoided 05/20/2024 0424 by Kirk Odella KIDD, RN Outcome: Progressing 05/20/2024 0424 by Kirk Odella KIDD, RN Outcome: Progressing   Problem: Activity: Goal: Risk for activity intolerance will decrease 05/20/2024 0424 by Kirk Odella KIDD, RN Outcome: Progressing 05/20/2024 0424 by Kirk Odella KIDD, RN Outcome: Progressing   Problem: Nutrition: Goal: Adequate nutrition will be maintained 05/20/2024 0424 by Kirk Odella KIDD, RN Outcome: Progressing 05/20/2024 0424 by Kirk Odella KIDD, RN Outcome: Progressing   Problem: Coping: Goal: Level of anxiety will decrease 05/20/2024  0424 by Kirk Odella KIDD, RN Outcome: Progressing 05/20/2024 0424 by Kirk Odella KIDD, RN Outcome: Progressing   Problem: Elimination: Goal: Will not experience complications related to bowel motility 05/20/2024 0424 by Kirk Odella KIDD, RN Outcome: Progressing 05/20/2024 0424 by Kirk Odella KIDD, RN Outcome: Progressing Goal: Will not experience complications related to urinary retention 05/20/2024 0424 by Kirk Odella KIDD, RN Outcome: Progressing 05/20/2024 0424 by Kirk Odella KIDD, RN Outcome: Progressing   Problem: Pain Managment: Goal: General experience of comfort will improve and/or be controlled 05/20/2024 0424 by Kirk Odella KIDD, RN Outcome: Progressing 05/20/2024 0424 by Kirk Odella KIDD, RN Outcome: Progressing   Problem: Safety: Goal: Ability to remain free from injury will improve 05/20/2024 0424 by Kirk Odella KIDD, RN Outcome: Progressing 05/20/2024 0424 by Kirk Odella KIDD, RN Outcome: Progressing   Problem: Skin Integrity: Goal: Risk for impaired skin integrity will decrease 05/20/2024 0424 by Kirk Odella KIDD, RN Outcome: Progressing 05/20/2024 0424 by Kirk Odella KIDD, RN Outcome: Progressing   Problem: Education: Goal: Ability to describe self-care measures that may prevent or decrease complications (Diabetes Survival Skills Education) will improve 05/20/2024 0424 by Kirk Odella KIDD, RN Outcome: Progressing 05/20/2024 0424 by Kirk Odella KIDD, RN Outcome: Progressing   Problem: Coping: Goal: Ability to adjust to condition or change in health will improve 05/20/2024 0424 by Kirk Odella KIDD, RN Outcome: Progressing 05/20/2024 0424 by Kirk Odella KIDD, RN Outcome: Progressing   Problem: Fluid Volume: Goal: Ability to maintain a balanced intake and output will improve 05/20/2024 0424 by Kirk Odella KIDD, RN Outcome: Progressing 05/20/2024 0424 by Kirk Odella KIDD, RN Outcome: Progressing   Problem: Health Behavior/Discharge  Planning: Goal: Ability to identify and utilize available resources and services will improve 05/20/2024 0424 by Kirk Odella KIDD, RN Outcome: Progressing 05/20/2024 0424 by Kirk Odella KIDD, RN Outcome: Progressing Goal: Ability to manage health-related needs will improve 05/20/2024 0424 by Kirk Odella KIDD, RN Outcome:  Progressing 05/20/2024 0424 by Kirk Odella KIDD, RN Outcome: Progressing   Problem: Metabolic: Goal: Ability to maintain appropriate glucose levels will improve 05/20/2024 0424 by Kirk Odella KIDD, RN Outcome: Progressing 05/20/2024 0424 by Kirk Odella KIDD, RN Outcome: Progressing   Problem: Nutritional: Goal: Maintenance of adequate nutrition will improve 05/20/2024 0424 by Kirk Odella KIDD, RN Outcome: Progressing 05/20/2024 0424 by Kirk Odella KIDD, RN Outcome: Progressing Goal: Progress toward achieving an optimal weight will improve 05/20/2024 0424 by Kirk Odella KIDD, RN Outcome: Progressing 05/20/2024 0424 by Kirk Odella KIDD, RN Outcome: Progressing   Problem: Skin Integrity: Goal: Risk for impaired skin integrity will decrease 05/20/2024 0424 by Kirk Odella KIDD, RN Outcome: Progressing 05/20/2024 0424 by Kirk Odella KIDD, RN Outcome: Progressing   Problem: Tissue Perfusion: Goal: Adequacy of tissue perfusion will improve 05/20/2024 0424 by Kirk Odella KIDD, RN Outcome: Progressing 05/20/2024 0424 by Kirk Odella KIDD, RN Outcome: Progressing   Problem: Education: Goal: Understanding of CV disease, CV risk reduction, and recovery process will improve 05/20/2024 0424 by Kirk Odella KIDD, RN Outcome: Progressing 05/20/2024 0424 by Kirk Odella KIDD, RN Outcome: Progressing Goal: Individualized Educational Video(s) 05/20/2024 0424 by Kirk Odella KIDD, RN Outcome: Progressing 05/20/2024 0424 by Kirk Odella KIDD, RN Outcome: Progressing   Problem: Activity: Goal: Ability to return to baseline activity level will improve 05/20/2024 0424 by  Kirk Odella KIDD, RN Outcome: Progressing 05/20/2024 0424 by Kirk Odella KIDD, RN Outcome: Progressing   Problem: Cardiovascular: Goal: Ability to achieve and maintain adequate cardiovascular perfusion will improve 05/20/2024 0424 by Kirk Odella KIDD, RN Outcome: Progressing 05/20/2024 0424 by Kirk Odella KIDD, RN Outcome: Progressing Goal: Vascular access site(s) Level 0-1 will be maintained 05/20/2024 0424 by Kirk Odella KIDD, RN Outcome: Progressing 05/20/2024 0424 by Kirk Odella KIDD, RN Outcome: Progressing   Problem: Health Behavior/Discharge Planning: Goal: Ability to safely manage health-related needs after discharge will improve 05/20/2024 0424 by Kirk Odella KIDD, RN Outcome: Progressing 05/20/2024 0424 by Kirk Odella KIDD, RN Outcome: Progressing

## 2024-05-20 NOTE — Telephone Encounter (Signed)
 Spoke with pt's wife per DPR.regarding a referral to [facility] in [place]. Pt's wife is hoping to check with their insurance to see if the physician they are being referred to is covered. Pt's wife is requesting the name of the physician. Pt was told her question would be sent to Dr. Wendel and Dr. Jeffrie. Pt's wife verbalized understanding. All questions if any were answered.

## 2024-05-20 NOTE — TOC Initial Note (Signed)
 Transition of Care [facility]) - Initial/Assessment Note    Patient Details  Name: Kirk [name] MRN: [id number] Date of Birth: Apr 02, 1946  Transition of [facility]) CM/SW Contact:    Justina Delcia Czar, RN Phone Number: [phone] 05/20/2024, 1:40 PM  Clinical Narrative:                 Spoke to pt and wife at bedside. Pt independent pta. Wife at home to assist with care. Pt due to all his upcoming appts wants to schedule PCP follow up appt.  Pt has scale at home for daily weights. Reviewed Living Better with HF booklet. Pt states he tries to adhere to a healthy diet.    Wife will provide transportation home.  Expected Discharge Plan: Home/Self Care Barriers to Discharge: No Barriers Identified   Patient Goals and CMS Choice Patient states their goals for this hospitalization and ongoing recovery are:: wants to remain independent          Expected Discharge Plan and Services   Discharge Planning Services: CM Consult   Living arrangements for the past 2 months: Single Family Home Expected Discharge Date: 05/20/24                                    Prior Living Arrangements/Services Living arrangements for the past 2 months: Single Family Home Lives with:: Spouse Patient language and need for interpreter reviewed:: Yes Do you feel safe going back to the place where you live?: Yes      Need for Family Participation in Patient Care: No (Comment) Care giver support system in place?: Yes (comment)   Criminal Activity/Legal Involvement Pertinent to Current Situation/Hospitalization: No - Comment as needed  Activities of Daily Living   ADL Screening (condition at time of admission) Independently performs ADLs?: Yes (appropriate for developmental age) Is the patient deaf or have difficulty hearing?: No Does the patient have difficulty seeing, even when wearing glasses/contacts?: No Does the patient have difficulty concentrating, remembering, or making decisions?:  No  Permission Sought/Granted Permission sought to share information with : Case Manager, PCP, Family Supports Permission granted to share information with : Yes, Verbal Permission Granted  Share Information with NAME: Kirk [name]  Permission granted to share info w AGENCY: PCP, DME  Permission granted to share info w Relationship: wife  Permission granted to share info w Contact Information: [phone]  Emotional Assessment Appearance:: Appears stated age Attitude/Demeanor/Rapport: Engaged Affect (typically observed): Accepting Orientation: : Oriented to Self, Oriented to Place, Oriented to  Time, Oriented to Situation   Psych Involvement: No (comment)  Admission diagnosis:  Acute hypoxemic respiratory failure (HCC) [J96.01] Acute on chronic congestive heart failure, unspecified heart failure type (HCC) [I50.9] Acute hypoxic respiratory failure (HCC) [J96.01] Acute respiratory failure with hypoxia (HCC) [J96.01] 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 2 diabetes mellitus (HCC) 02/04/2024   Overactive bladder 02/04/2024   Peripheral neuropathy 02/04/2024   Polyneuropathy due  to type 2 diabetes mellitus (HCC) 02/04/2024   Presence of aortocoronary bypass graft 02/04/2024   Right knee pain 02/04/2024   Bilateral tinnitus 02/04/2024   Anemia 02/04/2024   Osteoarthritis of left knee 11/05/2023   Wellness examination 10/10/2023   Urinary frequency 10/10/2023   Cough 10/04/2023   Morton's  toe of right foot 09/27/2023   Ankle pain 09/27/2023   Stiffness of right knee 06/29/2023   Muscle weakness 06/25/2023   Primary osteoarthritis of right knee 06/18/2023   Preoperative clearance 06/06/2023   Foot pain 03/21/2023   Neck pain 02/08/2023   Low testosterone  02/08/2023   Osteoarthritis of knee 11/29/2022   Right leg DVT (HCC) 04/26/2022   Chronic kidney disease, stage 3a (HCC) 04/26/2022   Essential hypertension 04/26/2022   Hypothyroidism 04/26/2022   AKI (acute kidney injury) (HCC) 04/26/2022   Type 2 diabetes mellitus (HCC) 04/26/2022   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   Cervical myelopathy (HCC) 01/22/2018   Seizures (HCC) 10/13/2013   Obesity 08/11/2013   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   History of anal cancer 04/12/2012   Cancer (HCC) 04/09/2012   Rectal mass 03/20/2012   History of colonic polyps 03/20/2012   Atrial fibrillation (HCC) 08/20/2011   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   Coronary artery disease involving native coronary artery of native heart without angina pectoris 08/07/2011   Aortic stenosis 07/31/2011   Elevated cholesterol    ED (erectile dysfunction)    Kidney stones    PCP:  [name], Raymond J, MD Pharmacy:   [facility] [address] [place] ([place]), [place] - [address] [place] ([place]) [place] [address] Phone: [phone] Fax: [phone]     Social Drivers of Health (SDOH) Social History: SDOH Screenings   Food Insecurity: No Food Insecurity (05/14/2024)  Housing: Low Risk  (05/14/2024)  Transportation Needs: No Transportation Needs (05/14/2024)  Utilities: Not At Risk (05/14/2024)  Alcohol  Screen: Low Risk  (05/16/2024)  Depression (PHQ2-9): Low Risk  (04/07/2024)  Financial Resource Strain: Low Risk  (05/16/2024)  Physical Activity: Sufficiently Active (02/06/2023)  Social Connections:  Socially Integrated (05/14/2024)  Stress: No Stress Concern Present (02/06/2023)  Tobacco Use: Low Risk  (05/14/2024)  Health Literacy: Adequate Health Literacy (06/06/2023)   SDOH Interventions: Alcohol  Usage Interventions: Intervention Not Indicated (Score <7) Financial Strain Interventions: Intervention Not Indicated   Readmission Risk Interventions     No data to display

## 2024-05-20 NOTE — Progress Notes (Signed)
 Patient given discharge instructions, medication list and follow up appointments. PIV removed, IV team aware to remove picc line. All questions answered will discharge home as ordered once picc line removed. Timber Marshman Jessup RN

## 2024-05-20 NOTE — Addendum Note (Signed)
 Addended by: THEOTIS SHARLET PARAS on: 05/20/2024 02:44 PM   Modules accepted: Orders

## 2024-05-21 ENCOUNTER — Telehealth: Payer: Self-pay

## 2024-05-21 NOTE — Patient Instructions (Signed)
 Visit Information  Thank you for taking time to visit with me today. Please don't hesitate to contact me if I can be of assistance to you   Patient instructions: Take lasix  as prescribed Monitor weight daily and record.  Notify provider for weight gain of 2-3 lbs overnight or 5 lbs in a week, increase shortness of breath or swelling in legs/ ankles/ feet Contact provider for any new/ ongoing symptoms Seek emergency medical services for serious symptoms: increase SOB, chest pain Take all medications as prescribed. Keep follow up visits with provider   Patient verbalizes understanding of instructions and care plan provided today and agrees to view in MyChart. Active MyChart status and patient understanding of how to access instructions and care plan via MyChart confirmed with patient.     The patient has been provided with contact information for the care management team and has been advised to call with any health related questions or concerns.   Please call the care guide team at [phone] if you need to cancel or reschedule your appointment.   Please call the Suicide and Crisis Lifeline: 988 call the [place]  National Suicide Prevention Lifeline: 505-653-5364 or TTY: 304-041-1006 TTY 567-880-0057) to talk to a trained counselor call 1-800-273-TALK (toll free, 24 hour hotline) go to [facility] [address] [place] [phone]) if you are experiencing a Mental Health or Behavioral Health Crisis or need someone to talk to.  Arvin Seip RN, BSN, CCM [employer], Population Health Case Manager Phone: [phone]

## 2024-05-21 NOTE — Transitions of Care (Post Inpatient/ED Visit) (Signed)
 05/21/2024  Name: Kirk [name] MRN: [id number] DOB: 28-Aug-1946  Today's TOC FU Call Status: Today's TOC FU Call Status:: Successful TOC FU Call Completed TOC FU Call Complete Date: 05/21/24 Patient's Name and Date of Birth confirmed.  Transition Care Management Follow-up Telephone Call Date of Discharge: 05/20/24 Discharge Facility: [facility]) Type of Discharge: Inpatient Admission Primary Inpatient Discharge Diagnosis:: Acute hypoxemic respiratory failure (HCC)  Acute on chronic diastolic CHF How have you been since you were released from the hospital?: Same Any questions or concerns?: Yes Patient Questions/Concerns:: Wife states she would like to know what the patients weight was on yesterday when he was in the hospital. Wife states patient has to monitor his weight daily and report a weight gain of 2-3 lbs overnight. Patient Questions/Concerns Addressed: Other: (Wife's question answered.  Per chart review patient's weight was 206.1 on 05/20/24)  Items Reviewed: Did you receive and understand the discharge instructions provided?: Yes Medications obtained,verified, and reconciled?: Yes (Medications Reviewed) Any new allergies since your discharge?: No Dietary orders reviewed?: Yes Type of Diet Ordered:: low salt heart healthy Do you have support at home?: Yes People in Home [RPT]: spouse Name of Support/Comfort Primary Source: Jahrell Hamor  Medications Reviewed Today: Medications Reviewed Today     Reviewed by Sukari Grist E, RN (Registered Nurse) on 05/21/24 at 1014  Med List Status: <None>   Medication Order Taking? Sig Documenting Provider Last Dose Status Informant  apixaban  (ELIQUIS ) 2.5 MG TABS tablet [id number] Yes Take 1 tablet by mouth twice daily Cloretta Arley NOVAK, MD  Active Spouse/Significant Other  aspirin  EC 81 MG tablet [id number] Yes Take 81 mg by mouth daily. [provider]  Active Spouse/Significant Other  DULoxetine  (CYMBALTA ) 60 MG capsule  [id number] Yes Take 1 capsule by mouth at bedtime  Patient taking differently: Take 1 capsule by mouth at bedtime   [name], Raymond J, MD  Active Spouse/Significant Other  FARXIGA  5 MG TABS tablet [id number] Yes Take 1 tablet by mouth once daily [name], [name], MD  Active Spouse/Significant Other  furosemide  (LASIX ) 20 MG tablet [id number] Yes Take 1 tablet (20 mg total) by mouth daily. Rai, Nydia POUR, MD  Active   levothyroxine  (SYNTHROID ) 50 MCG tablet [id number] Yes Take 1 tablet (50 mcg total) by mouth daily before breakfast. [name], [name], MD  Active Spouse/Significant Other  mexiletine (MEXITIL ) 200 MG capsule [id number] Yes Take 1 capsule (200 mg total) by mouth every 12 (twelve) hours. Davia Nydia POUR, MD  Active   MYRBETRIQ  25 MG TB24 tablet [id number] Yes Take 1 tablet (25 mg total) by mouth daily. [name], Raymond J, MD  Active Spouse/Significant Other  NEEDLE, DISP, 22 G 22G X 1-1/2 MISC [id number] Yes 1 each by Does not apply route once a week. Use to administer medication. [name], Raymond J, MD  Active Spouse/Significant Other  OZEMPIC , 0.25 OR 0.5 MG/DOSE, 2 MG/3ML SOPN [id number]  INJECT 0.25MG   SUBCUTANEOUSLY ONCE A WEEK  Patient not taking: Reported on 05/21/2024   [name], [name], MD  Active Spouse/Significant Other  rosuvastatin  (CRESTOR ) 40 MG tablet [id number] Yes TAKE 1 TABLET BY MOUTH AT BEDTIME [name], Raymond J, MD  Active Spouse/Significant Other  tadalafil  (CIALIS ) 20 MG tablet [id number] Yes Take 20 mg by mouth daily as needed for erectile dysfunction. [provider]  Active Spouse/Significant Other           Med Note LEONARDO, [facility]   Tue May 13, 2024  4:40 PM) prn  triamcinolone  cream (KENALOG ) 0.1 % [id number] Yes APPLY CREAM EXTERNALLY TO AFFECTED AREA TWICE DAILY AS NEEDED FOR ITCHING [provider]  Active Spouse/Significant Other           Med Note LEONARDO, KIMBERLY L   Tue May 13, 2024  4:38 PM) prn  Vitamins-Lipotropics  (LIPO-FLAVONOID PLUS) TABS [id number] Yes Take 1 tablet by mouth daily. [provider]  Active Spouse/Significant Other  zinc sulfate, 50mg  elemental zinc, 220 (50 Zn) MG capsule [id number] Yes Take 220 mg by mouth daily. [provider]  Active Spouse/Significant Other            Home Care and Equipment/Supplies: Were Home Health Services Ordered?: No Any new equipment or medical supplies ordered?: No  Functional Questionnaire: Do you need assistance with bathing/showering or dressing?: No Do you need assistance with meal preparation?: No Do you need assistance with eating?: No Do you have difficulty maintaining continence: No Do you need assistance with getting out of bed/getting out of a chair/moving?: No Do you have difficulty managing or taking your medications?: No  Follow up appointments reviewed: PCP Follow-up appointment confirmed?: No (wife states patient is not going to schedule a follow up with his primary provider as of yet because he is waiting for a call to get scheduled for a aortic valve replacement.  wife states patient has hospital follow up appointments with his cardiologist.) Specialist Hospital Follow-up appointment confirmed?: Yes Date of Specialist follow-up appointment?: 05/26/24 Follow-Up Specialty Provider:: Dr. Morene Brownie Do you need transportation to your follow-up appointment?: No Do you understand care options if your condition(s) worsen?: Yes-patient verbalized understanding  SDOH Interventions Today    Flowsheet Row Most Recent Value  SDOH Interventions   Food Insecurity Interventions Intervention Not Indicated  Housing Interventions Intervention Not Indicated  Transportation Interventions Intervention Not Indicated  Utilities Interventions Intervention Not Indicated   Discussed and offered 30 day TOC program.  Patient's wife declined services.   The patient has been provided with contact information for the care management  team and has been advised to call with any health -related questions or concerns.  The patient verbalized understanding with current plan of care.  The patient is directed to their insurance card regarding availability of benefits coverage.    Arvin Seip RN, BSN, CCM [employer], Population Health Case Manager Phone: [phone]

## 2024-05-23 ENCOUNTER — Telehealth (HOSPITAL_COMMUNITY): Payer: Self-pay | Admitting: Cardiology

## 2024-05-23 NOTE — Telephone Encounter (Signed)
 Called to confirm/remind patient of their appointment at the [facility] on 05/23/2024.   Appointment:   [x] Confirmed  [] Left mess   [] No answer/No voice mail  [] VM Full/unable to leave message  [] Phone not in service  Patient reminded to bring all medications and/or complete list.  Confirmed patient has transportation. Gave directions, instructed to utilize valet parking.

## 2024-05-24 NOTE — Progress Notes (Unsigned)
 Patient ID: Kirk [name] MRN: [id number] DOB/AGE: June 03, 1946 78 y.o.  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 The patient is a pleasant 78 year old gentleman who was admitted to [facility] recently with acute on chronic diastolic heart failure.  He underwent an evaluation including a TEE which demonstrated severe bioprosthetic aortic valve degeneration of his 25 mm Magna Ease valve.  This TEE demonstrated also moderate mitral regurgitation.  He was ultimately optimized from medical standpoint and discharged home.  TAVR protocol CTA was performed prior to discharge.  This demonstrated a VTC that was relatively worrisome and a valve in valve TAVR would likely require leaflet modification.  He is here to discuss further.  Currently, he feels fine with no dyspnea, leg swelling, or orthopnea. No significant bleeding while on Eliquis , but he experiences occasional dizziness. He has been limiting his activities, such as mowing the lawn and washing the car, to prevent overexertion.  He has a history of diabetes, managed with Ozempic  0.25 mg, and was given insulin  during his recent hospital stay. He has not resumed Ozempic  post-hospitalization. He is also on Lasix  20 mg daily for fluid management and monitors his weight to adjust the dose if necessary.  He is on lifelong Eliquis   due to a history of blood clots.         Past Medical History:  Diagnosis Date   Allergy    Anal cancer (HCC) 04/2012   Aortic stenosis    valve replacement   Arthritis    1995- cerv. 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   CHF (congestive heart failure) (HCC)    Coronary artery disease    aortic stenosis, CAD   Diabetes mellitus    Type 2   DVT (deep venous thrombosis) (HCC)    ED (erectile dysfunction)    Elevated cholesterol    Heart murmur    no problems   History of kidney stones    passed, lithrotrispy and surgery   History of radiation therapy 05/20/12-06/28/12   anal cancer=54gy total dose   Hypertension    Hypothyroidism    Kidney stones    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   Malignant neoplasm of other sites of rectum, rectosigmoid junction, and anus 04/12/2012   Myocardial infarction (HCC) 2012   Personal history of colonic polyps 03/20/2012   Pneumonia    Pulmonary embolism (HCC)    Rectal bleeding    Rectal mass 03/20/2012   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   #41mm [facility] Ease pericardial tissue valve    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   LIMA to diagonal branch    Seizure disorder, grand mal (HCC)    only 1  Seizures (HCC)    after taking Oxycodone ; not prescribed seizure med   Shortness of breath    Thrombosed hemorrhoids     Past Surgical History:  Procedure Laterality Date   ANAL FISSURE REPAIR     patient unsure of date   ANTERIOR CERVICAL DECOMP/DISCECTOMY FUSION N/A 01/22/2018   Procedure: ANTERIOR CERVICAL DECOMPRESSION/DISCECTOMY FUSION CERVICAL SIX- CERVICAL SEVEN;  Surgeon: Unice Pac, MD;  Location: [facility] OR;  Service: Neurosurgery;  Laterality: N/A;  ANTERIOR CERVICAL DECOMPRESSION/DISCECTOMY FUSION CERVICAL SIX- CERVICAL SEVEN   AORTIC VALVE  REPLACEMENT  08/11/2011   Procedure: AORTIC VALVE REPLACEMENT (AVR);  Surgeon: Sudie VEAR Laine, MD;  Location: [facility] OR;  Service: Open Heart Surgery;  Laterality: N/A;   C4-6 FUSION  1995   DR STERN   COLONOSCOPY     hx polyps   CORONARY ARTERY BYPASS GRAFT  08/16/2011   Procedure: CORONARY ARTERY BYPASS GRAFTING (CABG);  Surgeon: Sudie VEAR Laine, MD;  Location: [facility] OR;  Service: Open Heart Surgery;  Laterality: N/A;  CABG times one using left internal mammary artery   LITHOTRIPSY  2001 AND 2002   DR PETERSON   POLYPECTOMY     colon   RIGHT HEART CATH AND CORONARY/GRAFT ANGIOGRAPHY N/A 05/15/2024   Procedure: RIGHT HEART CATH AND CORONARY/GRAFT ANGIOGRAPHY;  Surgeon: Ladona Heinz, MD;  Location: [facility] INVASIVE CV LAB;  Service: Cardiovascular;  Laterality: N/A;   TOTAL KNEE ARTHROPLASTY Right 06/18/2023   Procedure: RIGHT TOTAL KNEE ARTHROPLASTY;  Surgeon: Melodi Lerner, MD;  Location: [facility] ORS;  Service: Orthopedics;  Laterality: Right;   TOTAL KNEE ARTHROPLASTY Left 11/05/2023   Procedure: ARTHROPLASTY, KNEE, TOTAL;  Surgeon: Melodi Lerner, MD;  Location: [facility] ORS;  Service: Orthopedics;  Laterality: Left;   TRANSESOPHAGEAL ECHOCARDIOGRAM (CATH LAB) N/A 05/15/2024   Procedure: TRANSESOPHAGEAL ECHOCARDIOGRAM;  Surgeon: Santo Stanly LABOR, MD;  Location: [facility] INVASIVE CV LAB;  Service: Cardiovascular;  Laterality: N/A;    Family History  Problem Relation Age of Onset   Breast cancer Mother    Heart attack Father    Esophageal cancer Maternal Grandfather    Anesthesia problems Neg Hx    Hypotension Neg Hx    Malignant hyperthermia Neg Hx    Pseudochol deficiency Neg Hx    Colon cancer Neg Hx    Colon polyps Neg Hx    Stomach cancer Neg Hx    Rectal cancer Neg Hx     Social History   Socioeconomic History   Marital status: Married    Spouse name: Sharman   Number of children: 2   Years of education: Not on file   Highest education level: High school graduate  Occupational History    Occupation: Surveyor, minerals semi-retired  Tobacco Use   Smoking status: Former    Types: Cigarettes    Passive exposure: Never   Smokeless tobacco: Never  Vaping Use   Vaping status: Never Used  Substance and Sexual Activity   Alcohol  use: Yes    Comment: rarely   Drug use: No   Sexual activity: Not Currently  Other Topics Concern   Not on file  Social History Narrative   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, Sharman   Self-employed painter   Social Drivers of Health   Financial Resource Strain: Low Risk  (05/16/2024)   Overall Financial Resource Strain (CARDIA)    Difficulty of Paying Living Expenses: Not hard at all  Food Insecurity: No Food Insecurity (05/21/2024)   Hunger Vital Sign    Worried About Programme researcher, broadcasting/film/video in  the Last Year: Never true    Ran Out of Food in the Last Year: Never true  Transportation Needs: No Transportation Needs (05/21/2024)   PRAPARE - Administrator, Civil Service (Medical): No    Lack of Transportation (Non-Medical): No  Physical Activity: Sufficiently Active (02/06/2023)   Exercise Vital Sign    Days of Exercise per Week: 7 days    Minutes of Exercise per Session: 30 min  Stress: No Stress Concern Present (02/06/2023)   [employer] of Occupational Health - Occupational Stress Questionnaire    Feeling of Stress : Not at all  Social Connections: Socially Integrated (05/14/2024)   Social Connection and Isolation Panel    Frequency of Communication with Friends and Family: More than three times a week    Frequency of Social Gatherings with Friends and Family: More than three times a week    Attends Religious Services: More than 4 times per year    Active Member of [employer] or Organizations: Yes    Attends Banker Meetings: Never    Marital Status: Married  Catering manager Violence: Not At Risk (05/21/2024)   Humiliation, Afraid, Rape, and Kick questionnaire    Fear of Current or Ex-Partner: No    Emotionally Abused: No    Physically Abused:  No    Sexually Abused: No     Prior to Admission medications   Medication Sig Start Date End Date Taking? Authorizing Provider  apixaban  (ELIQUIS ) 2.5 MG TABS tablet Take 1 tablet by mouth twice daily 03/17/24   Cloretta Arley NOVAK, MD  aspirin  EC 81 MG tablet Take 81 mg by mouth daily.    [provider]  DULoxetine  (CYMBALTA ) 60 MG capsule Take 1 capsule by mouth at bedtime Patient taking differently: Take 1 capsule by mouth at bedtime 04/23/24   [name], [name], MD  FARXIGA  5 MG TABS tablet Take 1 tablet by mouth once daily 04/23/24   [name], [name], MD  furosemide  (LASIX ) 20 MG tablet Take 1 tablet (20 mg total) by mouth daily. 05/21/24   Rai, Nydia POUR, MD  levothyroxine  (SYNTHROID ) 50 MCG tablet Take 1 tablet (50 mcg total) by mouth daily before breakfast. 02/05/24   [name], [name], MD  mexiletine (MEXITIL ) 200 MG capsule Take 1 capsule (200 mg total) by mouth every 12 (twelve) hours. 05/20/24   Rai, Nydia POUR, MD  MYRBETRIQ  25 MG TB24 tablet Take 1 tablet (25 mg total) by mouth daily. 06/14/23   [name], Raymond J, MD  NEEDLE, DISP, 22 G 22G X 1-1/2 MISC 1 each by Does not apply route once a week. Use to administer medication. 02/28/23   [name], Raymond J, MD  OZEMPIC , 0.25 OR 0.5 MG/DOSE, 2 MG/3ML SOPN INJECT 0.25MG   SUBCUTANEOUSLY ONCE A WEEK Patient not taking: Reported on 05/21/2024 05/06/24   [name], [name], MD  rosuvastatin  (CRESTOR ) 40 MG tablet TAKE 1 TABLET BY MOUTH AT BEDTIME 03/25/24   [name], [name], MD  tadalafil  (CIALIS ) 20 MG tablet Take 20 mg by mouth daily as needed for erectile dysfunction. 03/20/24 06/18/24  [provider]  triamcinolone  cream (KENALOG ) 0.1 % APPLY CREAM EXTERNALLY TO AFFECTED AREA TWICE DAILY AS NEEDED FOR ITCHING    [provider]  Vitamins-Lipotropics (LIPO-FLAVONOID PLUS) TABS Take 1 tablet by mouth daily.    [provider]  zinc sulfate, 50mg  elemental zinc, 220 (50 Zn) MG capsule Take 220 mg by mouth  daily.    [provider]    Allergies  Allergen Reactions   Cat Dander Shortness Of Breath and Swelling   Hydrocodone  Other (See Comments)    Seizures    Atorvastatin  Other (See Comments)    Joint pain   Dust Mite Extract Other (See Comments) and Swelling   Metformin Other (See Comments)    Diarrhea, muscle cramps   Oxycodone  Other (See Comments)    Seizure PATIENT DOES NOT WANT ANY NARCOTICS   Semaglutide (0.25 Or 0.5mg -Dos) Nausea Only    AT HIGHER DOSE    REVIEW OF SYSTEMS:  General: no fevers/chills/night sweats Eyes: no blurry vision, diplopia, or amaurosis ENT: no sore throat or hearing loss Resp: no cough, wheezing, or hemoptysis CV: no edema or palpitations GI: no abdominal pain, nausea, vomiting, diarrhea, or constipation GU: no dysuria, frequency, or hematuria Skin: no rash Neuro: no headache, numbness, tingling, or weakness of extremities Musculoskeletal: no joint pain or swelling Heme: no bleeding, DVT, or easy bruising Endo: no polydipsia or polyuria  BP 128/82 (BP Location: Right Arm, Patient Position: Sitting, Cuff Size: Normal)   Pulse 81   Resp 16   Ht 5' 11 (1.803 m)   Wt 207 lb (93.9 kg)   SpO2 90%   BMI 28.87 kg/m   PHYSICAL EXAM: GEN:  AO x 3 in no acute distress HEENT: normal Dentition: Normal Neck: JVP normal. +2 carotid upstrokes without bruits. No thyromegaly. Lungs: equal expansion, clear bilaterally CV: Apex is discrete and nondisplaced, RRR with 3 out of 6 systolic ejection murmur Abd: soft, non-tender, non-distended; no bruit; positive bowel sounds Ext: no edema, ecchymoses, or cyanosis Vascular: 2+ femoral pulses, 2+ radial pulses       Skin: warm and dry without rash Neuro: CN II-XII grossly intact; motor and sensory grossly intact    DATA AND STUDIES:  EKG: 2025 sinus rhythm  EKG Interpretation Date/Time:    Ventricular Rate:    PR Interval:    QRS Duration:    QT Interval:    QTC Calculation:   R Axis:       Text Interpretation:          CARDIAC STUDIES: Refer to CV Procedures and Imaging Tabs  05/13/2024: Pro Brain Natriuretic Peptide 2,462.0 05/14/2024: ALT 25; B Natriuretic Peptide 907.6; TSH 1.887 05/20/2024: BUN 19; Creatinine, Ser 1.07; Hemoglobin 15.8; Magnesium  2.1; Platelets 153; Potassium 3.9; Sodium 135   STS RISK CALCULATOR: Pending  NHYA CLASS: 2    ASSESSMENT AND PLAN:   1. Stenosis of prosthetic aortic valve, initial encounter   2. Nonrheumatic mitral valve regurgitation   3. S/P CABG x 1   4. Type 2 diabetes mellitus without complication, without long-term current use of insulin  (HCC)   5. Hyperlipidemia associated with type 2 diabetes mellitus (HCC)   6. Aortic atherosclerosis   7. Recurrent deep vein thrombosis (DVT) (HCC)   8. Secondary hypercoagulable state     Bioprosthetic aortic valve stenosis: Reviewed the patient's TAVR protocol CTA which demonstrates a jeopardized left main.  Unfortunately patient's LIMA to diagonal does not backfill the left main or the circumflex leaving the circumflex and proximal LAD at jeopardy with the valve in valve TAVR procedure.  I have reached out to my contact Dr. Selinda Sol in [place] .  The patient will need leaflet modification of his left leaflet prior to valve in valve TAVR.  We have transmitted all pertinent imaging data and the patient will be contacted by the team in  [place]  for further scheduling.  He is euvolemic on exam. Mitral regurgitation: We deferred therapy for now and concentrate on bioprosthetic aortic valve degeneration. Status post CABG: Discontinue aspirin  given chronic Eliquis .  Continue rosuvastatin  40 mg Hyperlipidemia: Continue rosuvastatin  40 mg Aortic atherosclerosis: Discontinue aspirin , continue Eliquis  2.5 mg twice daily, continue rosuvastatin  40 mg Recurrent DVT: Continue Eliquis  2.5 mg twice daily Secondary hypercoagulable state: Continue Eliquis  2.5 mg twice daily   I have personally  reviewed the patients imaging data as summarized above.  I have reviewed the natural history of aortic stenosis with the patient and family members who are present today. We have discussed the limitations of medical therapy and the poor prognosis associated with symptomatic aortic stenosis. We have also reviewed potential treatment options, including palliative medical therapy, conventional surgical aortic valve replacement, and transcatheter aortic valve replacement. We discussed treatment options in the context of this patient's specific comorbid medical conditions.   All of the patient's questions were answered today. Will make further recommendations based on the results of studies outlined above.   I spent 45 minutes reviewing all clinical data during and prior to this visit including all relevant imaging studies, laboratories, clinical information from other health systems and prior notes from both Cardiology and other specialties, interviewing the patient, conducting a complete physical examination, and coordinating care in order to formulate a comprehensive and personalized evaluation and treatment plan.   Long Brimage K Neiman Roots, MD  05/27/2024 1:12 PM    [facility] [address] [place], [place], [place]  [address] Phone: [phone]; Fax: [phone]

## 2024-05-26 ENCOUNTER — Encounter (HOSPITAL_COMMUNITY)

## 2024-05-26 ENCOUNTER — Ambulatory Visit (HOSPITAL_COMMUNITY)
Admission: RE | Admit: 2024-05-26 | Discharge: 2024-05-26 | Disposition: A | Discharge status: Home / Self Care | Attending: Cardiology | Admitting: Cardiology

## 2024-05-26 VITALS — BP 120/70 | HR 79 | Wt 206.0 lb

## 2024-05-26 DIAGNOSIS — I480 Paroxysmal atrial fibrillation: Secondary | ICD-10-CM | POA: Diagnosis not present

## 2024-05-26 DIAGNOSIS — T82857A Stenosis of cardiac prosthetic devices, implants and grafts, initial encounter: Secondary | ICD-10-CM

## 2024-05-26 DIAGNOSIS — I2722 Pulmonary hypertension due to left heart disease: Secondary | ICD-10-CM | POA: Diagnosis not present

## 2024-05-26 DIAGNOSIS — I2510 Atherosclerotic heart disease of native coronary artery without angina pectoris: Secondary | ICD-10-CM | POA: Insufficient documentation

## 2024-05-26 DIAGNOSIS — Z79899 Other long term (current) drug therapy: Secondary | ICD-10-CM | POA: Insufficient documentation

## 2024-05-26 DIAGNOSIS — I350 Nonrheumatic aortic (valve) stenosis: Secondary | ICD-10-CM | POA: Insufficient documentation

## 2024-05-26 DIAGNOSIS — I5032 Chronic diastolic (congestive) heart failure: Secondary | ICD-10-CM | POA: Diagnosis not present

## 2024-05-26 DIAGNOSIS — Z952 Presence of prosthetic heart valve: Secondary | ICD-10-CM | POA: Diagnosis not present

## 2024-05-26 DIAGNOSIS — I080 Rheumatic disorders of both mitral and aortic valves: Secondary | ICD-10-CM | POA: Insufficient documentation

## 2024-05-26 DIAGNOSIS — Z951 Presence of aortocoronary bypass graft: Secondary | ICD-10-CM

## 2024-05-26 DIAGNOSIS — I472 Ventricular tachycardia, unspecified: Secondary | ICD-10-CM | POA: Diagnosis not present

## 2024-05-26 NOTE — Patient Instructions (Signed)
 There has been no changes to your medications.  Your physician recommends that you schedule a follow-up appointment in: 3 months.  If you have any questions or concerns before your next appointment please send us  a message through [place] or call our office at [phone].    TO LEAVE A MESSAGE FOR THE NURSE SELECT OPTION 2, PLEASE LEAVE A MESSAGE INCLUDING: YOUR NAME DATE OF BIRTH CALL BACK NUMBER REASON FOR CALL**this is important as we prioritize the call backs  YOU WILL RECEIVE A CALL BACK THE SAME DAY AS LONG AS YOU CALL BEFORE 4:00 PM  At the [facility], you and your health needs are our priority. As part of our continuing mission to provide you with exceptional heart care, we have created designated Provider Care Teams. These Care Teams include your primary Cardiologist (physician) and Advanced Practice Providers (APPs- Physician Assistants and Nurse Practitioners) who all work together to provide you with the care you need, when you need it.   You may see any of the following providers on your designated Care Team at your next follow up: Dr Jules Oar Dr Peder Bourdon Dr. Alwin Baars Dr. Arta Lark Amy Marijane Shoulders, NP Ruddy Corral, [place] [facility], [place] Dennise Fitz, NP [place] Lee, NP Shawnee Dellen, NP Luster Salters, PharmD Bevely Brush, PharmD   Please be sure to bring in all your medications bottles to every appointment.    Thank you for choosing [facility]

## 2024-05-27 ENCOUNTER — Ambulatory Visit: Attending: Internal Medicine | Admitting: Internal Medicine

## 2024-05-27 ENCOUNTER — Encounter: Payer: Self-pay | Admitting: Internal Medicine

## 2024-05-27 VITALS — BP 128/82 | HR 81 | Resp 16 | Ht 71.0 in | Wt 207.0 lb

## 2024-05-27 DIAGNOSIS — I340 Nonrheumatic mitral (valve) insufficiency: Secondary | ICD-10-CM | POA: Diagnosis not present

## 2024-05-27 DIAGNOSIS — I82409 Acute embolism and thrombosis of unspecified deep veins of unspecified lower extremity: Secondary | ICD-10-CM

## 2024-05-27 DIAGNOSIS — Z951 Presence of aortocoronary bypass graft: Secondary | ICD-10-CM | POA: Diagnosis not present

## 2024-05-27 DIAGNOSIS — E785 Hyperlipidemia, unspecified: Secondary | ICD-10-CM

## 2024-05-27 DIAGNOSIS — D6869 Other thrombophilia: Secondary | ICD-10-CM

## 2024-05-27 DIAGNOSIS — T82857D Stenosis of cardiac prosthetic devices, implants and grafts, subsequent encounter: Secondary | ICD-10-CM | POA: Diagnosis not present

## 2024-05-27 DIAGNOSIS — T82857A Stenosis of cardiac prosthetic devices, implants and grafts, initial encounter: Secondary | ICD-10-CM

## 2024-05-27 DIAGNOSIS — I700 Atherosclerosis of aorta: Secondary | ICD-10-CM

## 2024-05-27 DIAGNOSIS — E119 Type 2 diabetes mellitus without complications: Secondary | ICD-10-CM | POA: Diagnosis not present

## 2024-05-27 DIAGNOSIS — E1169 Type 2 diabetes mellitus with other specified complication: Secondary | ICD-10-CM

## 2024-05-27 LAB — GLUCOSE, CAPILLARY: Glucose-Capillary: 226 mg/dL — ABNORMAL HIGH (ref 70–99)

## 2024-05-27 NOTE — Patient Instructions (Signed)
 Medication Instructions:  No medication changes were made at this visit. Continue current regimen.   *If you need a refill on your cardiac medications before your next appointment, please call your pharmacy*  Lab Work: None ordered today. If you have labs (blood work) drawn today and your tests are completely normal, you will receive your results only by: MyChart Message (if you have MyChart) OR A paper copy in the mail If you have any lab test that is abnormal or we need to change your treatment, we will call you to review the results.  Testing/Procedures: None ordered today.  Follow-Up: At [facility], you and your health needs are our priority.  As part of our continuing mission to provide you with exceptional heart care, our providers are all part of one team.  This team includes your primary Cardiologist (physician) and Advanced Practice Providers or APPs (Physician Assistants and Nurse Practitioners) who all work together to provide you with the care you need, when you need it.  Your next appointment:   Per structural heart team

## 2024-05-27 NOTE — Progress Notes (Addendum)
 Pre Surgical Assessment: 5 M Walk Test  78M=16.28ft  5 Meter Walk Test- trial 1: 4.98 seconds 5 Meter Walk Test- trial 2: 5.10 seconds 5 Meter Walk Test- trial 3: 4.60 seconds 5 Meter Walk Test Average: 4.89 seconds

## 2024-05-29 ENCOUNTER — Telehealth: Payer: Self-pay

## 2024-05-29 NOTE — Telephone Encounter (Signed)
 The patient's appointment has been rescheduled to September 26, 2023, to accommodate the provider's updated office hours. A letter confirming the new appointment date has been mailed to the patient.

## 2024-05-31 NOTE — Progress Notes (Signed)
   ADVANCED HEART FAILURE FOLLOW UP CLINIC NOTE  Referring Physician: de [name], Kirk J, MD  Primary Care: de [name], Kirk PARAS, MD Primary Cardiologist:  HPI: Kirk [name] is a 78 y.o. male who presents for follow up of symptomatic aortic stenosis.      Patient underwent surgical AVR in 2012 with LIMA-D1 and 25mm [facility] ease valve replacement. Did fairly well, increase in gradient on his last echo in 09/2023 but overall fairly well preserved. Presented in 05/2024 with acute hypoxic respiratory failure, became hypotensive with IV diuresis and then underwent TEE showing severe bioprosthetic AS with mean gradient 53.5, Vmax 4.79 and AVA 0.69. Subsequent right heart catheterization with severely elevated filling pressures and reduced cardiac index by assumed Fick.   Diuresed successfully. Discharged in stable condition.      SUBJECTIVE:  Patient overall reports that he is doing well.  He is accompanied by his wife today and is anxious to get his valve replaced.  He has been doing well with his current medical therapy, denies any significant palpitations or lightheadedness.  Has been ambulating around the house but no significant exertion.  Denies any recent volume overload symptoms.  PMH, current medications, allergies, social history, and family history reviewed in epic.  PHYSICAL EXAM: Vitals:   05/26/24 1537  BP: 120/70  Pulse: 79  SpO2: 96%   GENERAL: NAD, well appearing PULM:  Normal work of breathing, CTAB CARDIAC:  JVP: flat         Normal rate with regular rhythm. Systolic murmur at the RUSB.  No edema. Warm and well perfused extremities. ABDOMEN: Soft, non-tender, non-distended. NEUROLOGIC: Patient is oriented x3 with no focal or lateralizing neurologic deficits.     DATA REVIEW  ECHO: 05/15/2024: Severe MAC, replaced aortic valve with severe aortic stenosis, normal LVEF  CATH: 05/15/2024: RA 18, PA 68/30 (43), PCWP 31, Fick CO/CI 3.43/1.57.   ASSESSMENT &  PLAN:  Severe aortic valve stenosis: Previous surgical valve in 2012. Based on CTA will need coronary leaflet modification prior to procedure. Well compensated currently.  - Follow up in [facility] clinic for leaflet modification and TAVR, appointment 9/29   Chronic diastolic HF Pulmonary hypertension, WHO Group II In the setting of severe valvular disease, significant improvement with diuresis.  - Continue lasix  20mg  daily - Continue jardiance  10mg  daily - Difficulty with MRA during hospitalization, can reassess need after valve procedure   Mitral regurgitation:  - Moderate to severe, given MAC is a poor clip candidate - Suspect will improve after management of aortic valve disease   NSVT:  - Noted during previous hospital stay, multiple runs - Started on mexiletine, suspect he can stop after valve procedure  CAD - Mild disease, nonobstructive, prior LIMA-D1   PE:  - Prior history, continue eliqius 5mg  BID  Follow up in 2-3 months  Kirk Brownie, MD Advanced Heart Failure Mechanical Circulatory Support 05/31/24

## 2024-06-02 DIAGNOSIS — Z951 Presence of aortocoronary bypass graft: Secondary | ICD-10-CM | POA: Diagnosis not present

## 2024-06-02 DIAGNOSIS — R9431 Abnormal electrocardiogram [ECG] [EKG]: Secondary | ICD-10-CM | POA: Diagnosis not present

## 2024-06-02 DIAGNOSIS — I350 Nonrheumatic aortic (valve) stenosis: Secondary | ICD-10-CM | POA: Diagnosis not present

## 2024-06-02 DIAGNOSIS — I82409 Acute embolism and thrombosis of unspecified deep veins of unspecified lower extremity: Secondary | ICD-10-CM | POA: Diagnosis not present

## 2024-06-02 DIAGNOSIS — I2699 Other pulmonary embolism without acute cor pulmonale: Secondary | ICD-10-CM | POA: Diagnosis not present

## 2024-06-05 ENCOUNTER — Ambulatory Visit

## 2024-06-05 VITALS — BP 94/58 | HR 92 | Resp 18 | Ht 71.0 in | Wt 203.0 lb

## 2024-06-05 DIAGNOSIS — I2510 Atherosclerotic heart disease of native coronary artery without angina pectoris: Secondary | ICD-10-CM

## 2024-06-05 DIAGNOSIS — T82857A Stenosis of cardiac prosthetic devices, implants and grafts, initial encounter: Secondary | ICD-10-CM

## 2024-06-05 NOTE — Progress Notes (Signed)
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 Electronically signed by Stanly Leavens MD Signature Date/Time: 05/15/2024/6:03:38 PM    Final    CT SCANS  CT CORONARY MORPH W/CTA COR W/SCORE 05/16/2024  Addendum 05/16/2024  4:53 PM ADDENDUM REPORT: 05/16/2024 16:50  EXAM: OVER-READ INTERPRETATION  CT CHEST  The following report is an over-read performed by radiologist Dr. Lynwood Seip [facility] on 05/16/2024. This over-read does not include interpretation of cardiac or coronary anatomy or pathology. The coronary CTA interpretation by the cardiologist is attached.  COMPARISON:  None.  FINDINGS: The visualized portions of the extracardiac vascular structures of the chest are unremarkable. Visualized mediastinum is unremarkable. Visualized portion of upper abdomen is unremarkable. Small right pleural effusion is noted with minimal adjacent  subsegmental atelectasis. Patchy airspace opacities are seen in both upper lobes concerning for multifocal pneumonia. Visualized skeleton is unremarkable.  IMPRESSION: Small right pleural effusion is noted with minimal adjacent subsegmental atelectasis. Patchy airspace opacities are seen in both upper lobes concerning for multifocal pneumonia.   Electronically Signed By: Kirk Seip Raddle M.D. On: 05/16/2024 16:50  Narrative CLINICAL DATA:  Aortic Valve pathology with assessment for TAVR (prior SAVR)  EXAM: Cardiac TAVR CT  TECHNIQUE: The patient was scanned on a Siemens Force 192 slice scanner. A 120 kV retrospective scan was triggered in the descending thoracic aorta at 111 HU's. Gantry rotation speed was 270 msecs and collimation was .9 mm. No beta blockade or nitro were given. The 3D data set was reconstructed in 5% intervals of the R-R cycle. Systolic and diastolic phases were analyzed on a dedicated work station using MPR, MIP and VRT modes. The patient received 100 cc of contrast.  FINDINGS: Prosthetic Valve: 25 mm Edwards Magna Ease Pericardial Tissue Valve (implanted 08/16/2011).  Hypoattenuation and degree: All three prosthetic leaflets are prominently calcified. In cine evaluation, the non and the left leaflet are significantly hypokinetic and restricted. Normal motion of the right leaflet. Mild annular hypoattenuation.  Annular calcification: Severe- large protruding LVOT/subannular calcification. The intervalvular fibrosa is heavily calcified.  Mitral Valve: The is anterior leaflet restriction that arises from large anterior annular calcification that extends to restriction the A2 leaflet. Mild posterior annular calcification.  Presence of basal septal hypertrophy: Severe, 16 mm  Perimembranous septal diameter: 4 mm  Aortic Measurements- 65% phase due to artifact  Sinotubular Junction: 29 mm. Adventitial thickening noted. This may be related to  post-operative changes.  Ascending Thoracic Aorta: 36 mm  Descending Thoracic Aorta: 26 mm  Optimum Fluoroscopic Angle for Delivery: LAO 5, CAU 4  Left Main Coronary angle: RAO 0, CAU 11  Valves for structural team consideration (with Valve in Valve App Support):  26 mm Sapien is favored.  Left main virtual Valve to coronary distance: 4 mm  Right coronary virtual Valve to coronary distance: 5 mm  Evolut  Left main virtual Valve to coronary distance: 3 mm  Right coronary virtual Valve to coronary distance:4 mm  In the setting of severe subannular calcification, study assumes a non-fractured valve strategy.  Left main coronary height  7 mm.  Left SOV height 14 mm.  Estimated left prosthetic leaflet length 14 mm. Recommend heart team meeting for left coronary system deployment planning.  Right coronary height 8 mm.  Right SOV height 15 mm.  Non TAVR Valve Findings:  Coronary Arteries: Normal coronary origin. Study not completed with nitroglycerin . Suboptimal coronary assessment due to motion.  Suggests clinical correlation to 05/15/24 heart catheterization.  Systemic veins: Normal caliber  Main Pulmonary artery: Normal caliber  Pulmonary veins: Normal anatomy.  Left atrial appendage: Patent.  Interatrial septum: Lipomatous interatrial septal hypertrophy noted without a left to right communication.  Chamber dimensions: Left atrial dilation.  Pericardium: No calcification.  Extra Cardiac Findings as per separate reporting.  Notable artifacts: Slab and motion artifact; this affect the diastolic assessment of the annulus.  Image quality: Poor  IMPRESSION: 1. Prosthetic aortic stenosis. Findings pertinent to TAVR procedure are detailed above.  RECOMMENDATIONS:  The proposed cut-off value of 1,651 AU yielded a 93 % sensitivity and 75 % specificity in grading AS severity in patients with classical low-flow, low-gradient AS. Proposed different  cut-off values to define severe AS for men and women as 2,065 AU and 1,274 AU, respectively. The joint European and American recommendations for the assessment of AS consider the aortic valve calcium  score as a continuum - a very high calcium  score suggests severe AS and a low calcium  score suggests severe AS is unlikely.  Donney VEAR Jarome LULLA Stephen RENETTE, et al. 2017 ESC/EACTS Guidelines for the management of valvular heart disease. Eur Heart J [phone]  Coronary artery calcium  (CAC) score is a strong predictor of incident coronary heart disease (CHD) and provides predictive information beyond traditional risk factors. CAC scoring is reasonable to use in the decision to withhold, postpone, or initiate statin therapy in intermediate-risk or selected borderline-risk asymptomatic adults (age 14-75 years and LDL-C >=70 to <190 mg/dL) who do not have diabetes or established atherosclerotic cardiovascular disease (ASCVD).* In intermediate-risk (10-year ASCVD risk >=7.5% to <20%) adults or selected borderline-risk (10-year ASCVD risk >=5% to <7.5%) adults in whom a CAC score is measured for the purpose of making a treatment decision the following recommendations have been made:  If CAC = 0, it is reasonable to withhold statin therapy and reassess in 5 to 10 years, as long as higher risk conditions are absent (diabetes mellitus, family history of premature CHD in first degree relatives (males <55 years; females <65 years), cigarette smoking, LDL >=190 mg/dL or other independent risk factors).  If CAC is 1 to 99, it is reasonable to initiate statin therapy for patients >=59 years of age.  If CAC is >=100 or >=75th percentile, it is reasonable to initiate statin therapy at any age.  Cardiology referral should be considered for patients with CAC scores >=400 or >=75th percentile.  *2018 AHA/ACC/AACVPR/AAPA/ABC/ACPM/ADA/AGS/APhA/ASPC/NLA/PCNA Guideline on the Management of Blood  Cholesterol: A Report of the American College of Cardiology/American Heart Association Task Force on Clinical Practice Guidelines. J Am Coll Cardiol. 2019;73(24):3168-3209.  Mahesh  Chandrasekhar  Electronically Signed: By: Stanly Leavens M.D. On: 05/16/2024 16:42     ______________________________________________________________________________________________     EKG: Sinus rhythm.  I have independently reviewed the above radiologic studies and discussed with the patient   Recent Lab Findings: Lab Results  Component Value Date   WBC 8.0 05/20/2024   HGB 15.8 05/20/2024   HCT 48.5 05/20/2024   PLT 153 05/20/2024   GLUCOSE 117 (H) 05/20/2024   CHOL 130 08/21/2023   TRIG 148  08/21/2023   HDL 38 (L) 08/21/2023   LDLCALC 66 08/21/2023   ALT 25 05/14/2024   AST 35 05/14/2024   NA 135 05/20/2024   K 3.9 05/20/2024   CL 98 05/20/2024   CREATININE 1.07 05/20/2024   BUN 19 05/20/2024   CO2 27 05/20/2024   TSH 1.887 05/14/2024   INR 0.91 05/03/2012   HGBA1C 5.9 (H) 05/14/2024      Assessment / Plan:   Mr. 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  He also has a history of squamous cell carcinoma of the rectum, hypertension, diabetes, DVT/PE on Eliquis  and heart failure who was admitted to the hospital in early September with shortness of breath and productive cough.  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  He is not a surgical candidate at this time given history of previous heart surgery and older age.  His TAVR work up has revealed that leaflet modification would be necessary to do valve-in-valve TAVR.  He will proceed with leaflet modification and TAVR with Dr. Una in [place] .  I  spent 30 minutes counseling the patient face to  face.   Con RAMAN Griselle Rufer 06/05/2024 9:55 AM

## 2024-06-09 ENCOUNTER — Other Ambulatory Visit (HOSPITAL_BASED_OUTPATIENT_CLINIC_OR_DEPARTMENT_OTHER): Payer: Self-pay

## 2024-06-13 ENCOUNTER — Telehealth: Payer: Self-pay | Admitting: Cardiology

## 2024-06-13 NOTE — Telephone Encounter (Signed)
 S/w pt's wife. Informed that the patient may take one extra dose of Furosemide . Instructed her to call on Monday if this did not help. She verbalized understanding.

## 2024-06-13 NOTE — Telephone Encounter (Signed)
Pt's wife returning nurses call. Please advise.  

## 2024-06-13 NOTE — Telephone Encounter (Signed)
 Attempted phone call to pt and left voicemail message to contact office [phone].

## 2024-06-13 NOTE — Telephone Encounter (Signed)
 Pt c/o swelling/edema: STAT if pt has developed SOB within 24 hours  If swelling, where is the swelling located?   Wife stated she did not notice any swelling  How much weight have you gained and in what time span? 2 pounds overnight  Have you gained 2 pounds in a day or 5 pounds in a week?   2 pounds overnight  Do you have a log of your daily weights (if so, list)?     Wife stated patient usually weigh 202-203 pounds but today patient weighed 206 pounds  Are you currently taking a fluid pill?   Yes  Are you currently SOB?   No  Have you traveled recently in a car or plane for an extended period of time?   No  Wife Kirk [name]) called to report as requested that patient has gained 2 pounds overnight and wants advice on next steps.

## 2024-06-26 DIAGNOSIS — I351 Nonrheumatic aortic (valve) insufficiency: Secondary | ICD-10-CM | POA: Diagnosis not present

## 2024-06-26 DIAGNOSIS — I2510 Atherosclerotic heart disease of native coronary artery without angina pectoris: Secondary | ICD-10-CM | POA: Diagnosis not present

## 2024-06-26 NOTE — Progress Notes (Signed)
 Pt arrived to unit alert and oriented x4. Pt without any complaints. Denies chest pain, dyspnea, and/or general discomfort. Pt oriented to room/facility/policy/procedures. Pt states understanding to call for any assistance needed. Call bell in reach. Will continue to monitor.

## 2024-06-27 DIAGNOSIS — R9431 Abnormal electrocardiogram [ECG] [EKG]: Secondary | ICD-10-CM | POA: Diagnosis not present

## 2024-06-27 DIAGNOSIS — I058 Other rheumatic mitral valve diseases: Secondary | ICD-10-CM | POA: Diagnosis not present

## 2024-06-28 DIAGNOSIS — I350 Nonrheumatic aortic (valve) stenosis: Secondary | ICD-10-CM | POA: Diagnosis not present

## 2024-06-29 DIAGNOSIS — I447 Left bundle-branch block, unspecified: Secondary | ICD-10-CM | POA: Diagnosis not present

## 2024-06-30 ENCOUNTER — Other Ambulatory Visit: Payer: Self-pay | Admitting: Emergency Medicine

## 2024-06-30 ENCOUNTER — Telehealth (HOSPITAL_COMMUNITY): Payer: Self-pay

## 2024-06-30 ENCOUNTER — Encounter: Payer: Self-pay | Admitting: Internal Medicine

## 2024-06-30 ENCOUNTER — Encounter: Payer: Self-pay | Admitting: Cardiology

## 2024-06-30 ENCOUNTER — Telehealth: Payer: Self-pay | Admitting: *Deleted

## 2024-06-30 ENCOUNTER — Telehealth: Payer: Self-pay | Admitting: Cardiology

## 2024-06-30 ENCOUNTER — Other Ambulatory Visit: Payer: Self-pay

## 2024-06-30 DIAGNOSIS — D696 Thrombocytopenia, unspecified: Secondary | ICD-10-CM

## 2024-06-30 NOTE — Telephone Encounter (Signed)
 Patient's wife was calling to schedule 2wk f/u appt with Dr. Jeffrie, she was offered appt with APP as Dr. Jeffrie didn't have availability, she refused and requested called from Dr. Jeffrie' nurse.

## 2024-06-30 NOTE — Telephone Encounter (Signed)
 Outside/paper referral received by Dr. Una from [facility]. Will fax over request for MD order & EKG if patient confirms interest. Insurance benefits and eligibility to be determined.   Attempted to call patient regarding interest in cardiac rehab- no answer, left message. Sent MyChart message.

## 2024-06-30 NOTE — Transitions of Care (Post Inpatient/ED Visit) (Signed)
 06/30/2024  Name: Kirk [name] MRN: [id number] DOB: [birth year]  Today's TOC FU Call Status: Today's TOC FU Call Status:: Successful TOC FU Call Completed TOC FU Call Complete Date: 06/30/24 Patient's Name and Date of Birth confirmed.  Transition Care Management Follow-up Telephone Call Date of Discharge: 06/28/24 Discharge Facility: Other ([facility]) Name of Other ([facility]) [facility] Facility: [facility] Type of Discharge: Inpatient Admission Primary Inpatient Discharge Diagnosis:: Severe aortic stenosis LEW How have you been since you were released from the hospital?: Better Any questions or concerns?: Yes (Wife concerned about difficulty getting appointment with Dr jeffrie) Patient Questions/Concerns Addressed: Other: (Wife called Dr jeffrie of[facility] and left message)  Items Reviewed: Did you receive and understand the discharge instructions provided?: Yes Medications obtained,verified, and reconciled?: Yes (Medications Reviewed) Any new allergies since your discharge?: No Dietary orders reviewed?: No Do you have support at home?: Yes People in Home [RPT]: spouse Name of Support/Comfort Primary Source: [place]  Medications Reviewed Today: Medications Reviewed Today     Reviewed by Kennieth Cathlean DEL, RN (Case Manager) on 06/30/24 at 1547  Med List Status: <None>   Medication Order Taking? Sig Documenting Provider Last Dose Status Informant  apixaban  (ELIQUIS ) 2.5 MG TABS tablet [id number]  Take 1 tablet by mouth twice daily Cloretta Arley NOVAK, MD  Active Spouse/Significant Other           Med Note LORICE CATHLEAN DEL Pablo Jun 30, 2024  3:45 PM) PAUSE  aspirin  EC 81 MG tablet [id number] Yes Take 81 mg by mouth daily. [provider]  Active Spouse/Significant Other  cholecalciferol  (VITAMIN D3) 25 MCG (1000 UNIT) tablet [id number] Yes Take 1,000 Units by mouth daily. [provider]  Active   clopidogrel (PLAVIX) 75 MG tablet [id number] Yes  Take 75 mg by mouth daily. [provider]  Active   DULoxetine  (CYMBALTA ) 60 MG capsule [id number] Yes Take 1 capsule by mouth at bedtime de [place], Raymond J, MD  Active Spouse/Significant Other  FARXIGA  5 MG TABS tablet [id number] Yes Take 1 tablet by mouth once daily [place], [name], MD  Active Spouse/Significant Other  furosemide  (LASIX ) 20 MG tablet [id number] Yes Take 1 tablet (20 mg total) by mouth daily. Rai, Nydia POUR, MD  Active   levothyroxine  (SYNTHROID ) 50 MCG tablet [id number] Yes Take 1 tablet (50 mcg total) by mouth daily before breakfast. de [place], [name], MD  Active Spouse/Significant Other  mexiletine (MEXITIL ) 200 MG capsule [id number] Yes Take 1 capsule (200 mg total) by mouth every 12 (twelve) hours. Davia Nydia POUR, MD  Active   MYRBETRIQ  25 MG TB24 tablet [id number] Yes Take 1 tablet (25 mg total) by mouth daily. de [place], Raymond J, MD  Active Spouse/Significant Other  NEEDLE, DISP, 22 G 22G X 1-1/2 MISC [id number] Yes 1 each by Does not apply route once a week. Use to administer medication. de [place], Raymond J, MD  Active Spouse/Significant Other  OZEMPIC , 0.25 OR 0.5 MG/DOSE, 2 MG/3ML NELMA [id number] Yes INJECT 0.25MG   SUBCUTANEOUSLY ONCE A WEEK de [place], Raymond J, MD  Active Spouse/Significant Other  rosuvastatin  (CRESTOR ) 40 MG tablet [id number] Yes TAKE 1 TABLET BY MOUTH AT BEDTIME de [place], Raymond J, MD  Active Spouse/Significant Other  triamcinolone  cream (KENALOG ) 0.1 % [id number] Yes APPLY CREAM EXTERNALLY TO AFFECTED AREA TWICE DAILY AS NEEDED FOR ITCHING [provider]  Active Spouse/Significant Other           Med Note (CHILDERS, KIMBERLY L  Tue May 13, 2024  4:38 PM) prn  Vitamins-Lipotropics (LIPO-FLAVONOID PLUS) TABS [id number] Yes Take 1 tablet by mouth daily. [provider]  Active Spouse/Significant Other  zinc sulfate, 50mg  elemental zinc, 220 (50 Zn) MG capsule [id number] Yes Take 220 mg by mouth daily. [provider]  Active  Spouse/Significant Other            Home Care and Equipment/Supplies: Were Home Health Services Ordered?: Yes Name of Home Health Agency:: [facility] unknown Has Agency set up a time to come to your home?: No EMR reviewed for Home Health Orders: Orders present/patient has not received call (refer to CM for follow-up) (order preent in Dr notes. No company listed. RN called Dr office and left message to find out name of company.) Any new equipment or medical supplies ordered?: NA  Functional Questionnaire: Do you need assistance with bathing/showering or dressing?: Yes Do you need assistance with meal preparation?: Yes Do you need assistance with eating?: No Do you have difficulty maintaining continence: No Do you need assistance with getting out of bed/getting out of a chair/moving?: No  Follow up appointments reviewed: PCP Follow-up appointment confirmed?: No MD Provider Line Number:[phone] Given: Yes (RN explained that Dr would like patient to see PCP within 2 weeks) Specialist Hospital Follow-up appointment confirmed?: Yes Date of Specialist follow-up appointment?: 08/05/24 Follow-Up Specialty Provider:: [id number] Tinnie Patee NP/ Patient has a call in to see DR skains. Refuse to see any other Dr except  him. Do you need transportation to your follow-up appointment?: No Do you understand care options if your condition(s) worsen?: Yes-patient verbalized understanding  SDOH Interventions Today    Flowsheet Row Most Recent Value  SDOH Interventions   Food Insecurity Interventions Intervention Not Indicated  Housing Interventions Intervention Not Indicated  Transportation Interventions Intervention Not Indicated, Patient Resources (Friends/Family)  Utilities Interventions Intervention Not Indicated   Discussed and offered 30 day TOC program.  Patient declined. The patient has been provided with contact information for the care management team and has been advised to call with  any health -related questions or concerns.  The patient verbalized understanding with current plan of care.  The patient is directed to their insurance card regarding availability of benefits coverage   Per notes it stated that PT/OT/ST referral sent. Po provider listed. RN called the Dr office that referred he patient for referral company name.  Cathlean Headland BSN RN [facility] Health Care Management Coordinator [email or web address] Direct Dial: [phone]  Fax: [phone] Website: [email or web address]

## 2024-07-01 ENCOUNTER — Ambulatory Visit: Payer: Self-pay | Admitting: Physician Assistant

## 2024-07-01 LAB — CBC WITH DIFFERENTIAL/PLATELET
Basophils Absolute: 0 x10E3/uL (ref 0.0–0.2)
Basos: 1 %
EOS (ABSOLUTE): 0.6 x10E3/uL — ABNORMAL HIGH (ref 0.0–0.4)
Eos: 10 %
Hematocrit: 41.3 % (ref 37.5–51.0)
Hemoglobin: 13.4 g/dL (ref 13.0–17.7)
Immature Grans (Abs): 0.1 x10E3/uL (ref 0.0–0.1)
Immature Granulocytes: 1 %
Lymphocytes Absolute: 0.6 x10E3/uL — ABNORMAL LOW (ref 0.7–3.1)
Lymphs: 10 %
MCH: 30.7 pg (ref 26.6–33.0)
MCHC: 32.4 g/dL (ref 31.5–35.7)
MCV: 95 fL (ref 79–97)
Monocytes Absolute: 0.6 x10E3/uL (ref 0.1–0.9)
Monocytes: 10 %
Neutrophils Absolute: 4.3 x10E3/uL (ref 1.4–7.0)
Neutrophils: 68 %
Platelets: 146 x10E3/uL — ABNORMAL LOW (ref 150–450)
RBC: 4.37 x10E6/uL (ref 4.14–5.80)
RDW: 14 % (ref 11.6–15.4)
WBC: 6.3 x10E3/uL (ref 3.4–10.8)

## 2024-07-01 NOTE — Telephone Encounter (Signed)
 Appt scheduled 11/6 @ 10 AM with Dr Wendel.  Wife aware and was appreciative of the assistance.

## 2024-07-03 NOTE — Progress Notes (Signed)
 Patient ID: Kirk [name] MRN: [id number] DOB/AGE: 76-Jun-1947 76 y.o.  Primary Care Physician:de [place], [name], MD Primary Cardiologist: Jeffrie  CC:  Aortic valvular disease management     FOCUSED PROBLEM LIST:   Bioprosthetic aortic valve degeneration with stenosis (25 Magna Ease 2012) AVA 0.69, MG 53, V-max 4.8, EF 60 to 65% TTE September 2025 VTC left main ~ 4 mm, VTC RCA ~5 mm Status post BASILICA of right coronary cusp and UNICORN left coronary cusp with 23 mm SAPIEN 3 valve complicated by acute ostial RCA occlusion likely due to leaflet avulsion >> PCI 4.0 x 15 mm Xience October 2025 Mitral regurgitation Multiple separate jets in totality likely moderate TEE September 2025 Coronary artery disease status post CABG LIMA to diagonal 2012 T2DM Not on insulin  Hyperlipidemia Aortic atherosclerosis CT abdomen pelvis 2025 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 The patient is a pleasant 78 year old gentleman who was admitted to [facility] recently with acute on chronic diastolic heart failure.  He underwent an evaluation including a TEE which demonstrated severe bioprosthetic aortic valve degeneration of his 25 mm Magna Ease valve.  This TEE demonstrated also moderate mitral regurgitation.  He was ultimately optimized from medical standpoint and discharged home.  TAVR protocol CTA was performed prior to discharge.  This demonstrated a VTC that was relatively worrisome and a valve in valve TAVR would likely require leaflet modification.  He is here to discuss further.  Currently, he feels fine with no dyspnea, leg swelling, or orthopnea. No significant bleeding while on Eliquis , but he experiences occasional dizziness. He has been limiting his activities, such as mowing the lawn and washing the car, to prevent overexertion.  He has a history of diabetes, managed with Ozempic  0.25 mg, and was given insulin  during  his recent hospital stay. He has not resumed Ozempic  post-hospitalization. He is also on Lasix  20 mg daily for fluid management and monitors his weight to adjust the dose if necessary.  He is on lifelong Eliquis  due to a history of blood clots.  Plan: Referred to [facility] clinic.  November 2025: Patient consents to use of AI scribe. In the interim the patient underwent successful valve in valve TAVR as detailed above.  It was complicated by RCA occlusion treated with stenting.  He had some issues with bilateral groin bleeding.  The provisional plan of discharge on triple therapy was therefore altered and the patient is on aspirin  and Plavix only with a plan to restart Eliquis  at his 1 month follow-up in [place] .  He is doing very well.  He experiences dizziness every morning upon waking, which has been persistent since his recent cardiovascular procedure where a stent was placed. The dizziness improved on the morning of the visit.  He has increased urinary frequency, urinating sometimes every thirty minutes, which has worsened recently. He has a history of bladder spasms and a small bladder due to prior radiation in the anal area. A Botox injection in the bladder was scheduled but postponed due to a scheduling conflict.       Past Medical History:  Diagnosis Date   Allergy    Anal cancer (HCC) 04/2012   Aortic stenosis    valve replacement   Arthritis    1995- cerv. 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   CHF (congestive heart failure) (HCC)    Coronary artery disease    aortic stenosis, CAD   Diabetes mellitus    Type 2   DVT (deep venous thrombosis) (HCC)    ED (erectile dysfunction)    Elevated cholesterol    Heart murmur    no problems   History of kidney stones    passed, lithrotrispy and surgery   History of radiation  therapy 05/20/12-06/28/12   anal cancer=54gy total dose   Hypertension    Hypothyroidism    Kidney stones    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   Malignant neoplasm of other sites of rectum, rectosigmoid junction, and anus 04/12/2012   Myocardial infarction [facility]) 2012   Personal history of colonic polyps 03/20/2012   Pneumonia    Pulmonary embolism (HCC)    Rectal bleeding    Rectal mass 03/20/2012   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   #57mm [facility] Ease pericardial tissue valve    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   LIMA to diagonal branch    Seizure disorder, grand mal (HCC)    only 1   Seizures (HCC)    after taking Oxycodone ; not prescribed seizure med   Shortness of breath    Thrombosed hemorrhoids     Past Surgical History:  Procedure Laterality Date   ANAL FISSURE REPAIR     patient unsure of date   ANTERIOR CERVICAL DECOMP/DISCECTOMY FUSION N/A 01/22/2018   Procedure: ANTERIOR CERVICAL DECOMPRESSION/DISCECTOMY FUSION CERVICAL SIX- CERVICAL SEVEN;  Surgeon: Unice Pac, MD;  Location: [facility] OR;  Service: Neurosurgery;  Laterality: N/A;  ANTERIOR CERVICAL DECOMPRESSION/DISCECTOMY FUSION CERVICAL SIX- CERVICAL SEVEN   AORTIC VALVE REPLACEMENT  08/11/2011   Procedure: AORTIC VALVE REPLACEMENT (AVR);  Surgeon: Sudie VEAR Laine, MD;  Location: [facility] OR;  Service: Open Heart Surgery;  Laterality: N/A;   C4-6 FUSION  1995   DR STERN   COLONOSCOPY     hx polyps   CORONARY ARTERY BYPASS GRAFT  08/16/2011   Procedure: CORONARY ARTERY BYPASS GRAFTING (CABG);  Surgeon: Sudie VEAR Laine, MD;  Location: [facility] OR;  Service: Open Heart Surgery;  Laterality: N/A;  CABG times one using left internal mammary artery   LITHOTRIPSY  2001 AND 2002   DR PETERSON   POLYPECTOMY     colon   RIGHT HEART CATH AND CORONARY/GRAFT ANGIOGRAPHY N/A 05/15/2024   Procedure: RIGHT HEART CATH AND CORONARY/GRAFT ANGIOGRAPHY;  Surgeon: Ladona Heinz, MD;  Location: [facility] INVASIVE CV LAB;  Service:  Cardiovascular;  Laterality: N/A;   TOTAL KNEE ARTHROPLASTY Right 06/18/2023   Procedure: RIGHT TOTAL KNEE ARTHROPLASTY;  Surgeon: Melodi Lerner, MD;  Location: [facility] ORS;  Service: Orthopedics;  Laterality: Right;   TOTAL KNEE ARTHROPLASTY Left 11/05/2023   Procedure: ARTHROPLASTY, KNEE, TOTAL;  Surgeon: Melodi Lerner, MD;  Location: [facility] ORS;  Service: Orthopedics;  Laterality: Left;   TRANSESOPHAGEAL ECHOCARDIOGRAM (CATH LAB) N/A 05/15/2024   Procedure: TRANSESOPHAGEAL ECHOCARDIOGRAM;  Surgeon: Santo Stanly LABOR, MD;  Location: [facility] INVASIVE CV LAB;  Service: Cardiovascular;  Laterality: N/A;    Family History  Problem Relation Age of Onset   Breast cancer Mother    Heart attack Father    Esophageal cancer Maternal Grandfather    Anesthesia problems Neg Hx    Hypotension Neg Hx    Malignant hyperthermia Neg Hx    Pseudochol deficiency Neg Hx    Colon cancer Neg Hx  Colon polyps Neg Hx    Stomach cancer Neg Hx    Rectal cancer Neg Hx     Social History   Socioeconomic History   Marital status: Married    Spouse name: Sharman   Number of children: 2   Years of education: Not on file   Highest education level: High school graduate  Occupational History   Occupation: Surveyor, minerals semi-retired  Tobacco Use   Smoking status: Former    Types: Cigarettes    Passive exposure: Never   Smokeless tobacco: Never  Vaping Use   Vaping status: Never Used  Substance and Sexual Activity   Alcohol  use: Yes    Comment: rarely   Drug use: No   Sexual activity: Not Currently  Other Topics Concern   Not on file  Social History Narrative   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, Sharman   Self-employed painter   Social Drivers of Health   Financial Resource Strain: Low Risk  (05/16/2024)   Overall Financial Resource Strain (CARDIA)    Difficulty of Paying Living Expenses: Not hard at all  Food Insecurity: No Food Insecurity (06/30/2024)   Hunger Vital Sign    Worried About Running Out of Food in the Last Year:  Never true    Ran Out of Food in the Last Year: Never true  Transportation Needs: No Transportation Needs (06/30/2024)   PRAPARE - Administrator, Civil Service (Medical): No    Lack of Transportation (Non-Medical): No  Physical Activity: Sufficiently Active (02/06/2023)   Exercise Vital Sign    Days of Exercise per Week: 7 days    Minutes of Exercise per Session: 30 min  Stress: No Stress Concern Present (02/06/2023)   [employer] of Occupational Health - Occupational Stress Questionnaire    Feeling of Stress : Not at all  Social Connections: Socially Integrated (05/14/2024)   Social Connection and Isolation Panel    Frequency of Communication with Friends and Family: More than three times a week    Frequency of Social Gatherings with Friends and Family: More than three times a week    Attends Religious Services: More than 4 times per year    Active Member of [employer] or Organizations: Yes    Attends Banker Meetings: Never    Marital Status: Married  Catering Manager Violence: Not At Risk (06/30/2024)   Humiliation, Afraid, Rape, and Kick questionnaire    Fear of Current or Ex-Partner: No    Emotionally Abused: No    Physically Abused: No    Sexually Abused: No     Prior to Admission medications   Medication Sig Start Date End Date Taking? Authorizing Provider  apixaban  (ELIQUIS ) 2.5 MG TABS tablet Take 1 tablet by mouth twice daily 03/17/24   Cloretta Arley NOVAK, MD  aspirin  EC 81 MG tablet Take 81 mg by mouth daily.    [provider]  DULoxetine  (CYMBALTA ) 60 MG capsule Take 1 capsule by mouth at bedtime Patient taking differently: Take 1 capsule by mouth at bedtime 04/23/24   de [place], [name], MD  FARXIGA  5 MG TABS tablet Take 1 tablet by mouth once daily 04/23/24   de [place], [name], MD  furosemide  (LASIX ) 20 MG tablet Take 1 tablet (20 mg total) by mouth daily. 05/21/24   Rai, Nydia POUR, MD  levothyroxine  (SYNTHROID ) 50 MCG tablet Take 1 tablet  (50 mcg total) by mouth daily before breakfast. 02/05/24   de [place], [name], MD  mexiletine (MEXITIL ) 200  MG capsule Take 1 capsule (200 mg total) by mouth every 12 (twelve) hours. 05/20/24   Rai, Nydia POUR, MD  MYRBETRIQ  25 MG TB24 tablet Take 1 tablet (25 mg total) by mouth daily. 06/14/23   de [place], Raymond J, MD  NEEDLE, DISP, 22 G 22G X 1-1/2 MISC 1 each by Does not apply route once a week. Use to administer medication. 02/28/23   de [place], Raymond J, MD  OZEMPIC , 0.25 OR 0.5 MG/DOSE, 2 MG/3ML SOPN INJECT 0.25MG   SUBCUTANEOUSLY ONCE A WEEK Patient not taking: Reported on 05/21/2024 05/06/24   de [place], [name], MD  rosuvastatin  (CRESTOR ) 40 MG tablet TAKE 1 TABLET BY MOUTH AT BEDTIME 03/25/24   de [place], [name], MD  tadalafil  (CIALIS ) 20 MG tablet Take 20 mg by mouth daily as needed for erectile dysfunction. 03/20/24 06/18/24  [provider]  triamcinolone  cream (KENALOG ) 0.1 % APPLY CREAM EXTERNALLY TO AFFECTED AREA TWICE DAILY AS NEEDED FOR ITCHING    [provider]  Vitamins-Lipotropics (LIPO-FLAVONOID PLUS) TABS Take 1 tablet by mouth daily.    [provider]  zinc sulfate, 50mg  elemental zinc, 220 (50 Zn) MG capsule Take 220 mg by mouth daily.    [provider]    Allergies  Allergen Reactions   Cat Dander Shortness Of Breath and Swelling   Hydrocodone  Other (See Comments)    Seizures    Atorvastatin  Other (See Comments)    Joint pain   Dust Mite Extract Other (See Comments) and Swelling   Metformin Other (See Comments)    Diarrhea, muscle cramps   Oxycodone  Other (See Comments)    Seizure PATIENT DOES NOT WANT ANY NARCOTICS   Semaglutide (0.25 Or 0.5mg -Dos) Nausea Only    AT HIGHER DOSE    REVIEW OF SYSTEMS:  General: no fevers/chills/night sweats Eyes: no blurry vision, diplopia, or amaurosis ENT: no sore throat or hearing loss Resp: no cough, wheezing, or hemoptysis CV: no edema or palpitations GI: no abdominal pain, nausea,  vomiting, diarrhea, or constipation GU: no dysuria, frequency, or hematuria Skin: no rash Neuro: no headache, numbness, tingling, or weakness of extremities Musculoskeletal: no joint pain or swelling Heme: no bleeding, DVT, or easy bruising Endo: no polydipsia or polyuria  BP 110/70 (BP Location: Right Arm, Patient Position: Sitting)   Pulse 88   Ht 5' 11 (1.803 m)   Wt 199 lb 12.8 oz (90.6 kg)   SpO2 96%   BMI 27.87 kg/m   PHYSICAL EXAM: GEN:  AO x 3 in no acute distress HEENT: normal Dentition: Normal Neck: JVP normal. +2 carotid upstrokes without bruits. No thyromegaly. Lungs: equal expansion, clear bilaterally CV: Apex is discrete and nondisplaced, RRR with 3 out of 6 systolic ejection murmur Abd: soft, non-tender, non-distended; no bruit; positive bowel sounds Ext: Mild ecchymoses involving scrotum and bilateral groins; no hematoma Vascular: 2+ femoral pulses, 2+ radial pulses       Skin: warm and dry without rash Neuro: CN II-XII grossly intact; motor and sensory grossly intact    DATA AND STUDIES:  EKG: 2025 sinus rhythm  EKG Interpretation Date/Time:    Ventricular Rate:    PR Interval:    QRS Duration:    QT Interval:    QTC Calculation:   R Axis:      Text Interpretation:          CARDIAC STUDIES: Refer to CV Procedures and Imaging Tabs  05/13/2024: Pro Brain Natriuretic Peptide 2,462.0 05/14/2024: ALT 25; B Natriuretic Peptide  907.6; TSH 1.887 05/20/2024: BUN 19; Creatinine, Ser 1.07; Magnesium  2.1; Potassium 3.9; Sodium 135 06/30/2024: Hemoglobin 13.4; Platelets 146   STS RISK CALCULATOR: Pending  NHYA CLASS: 2    ASSESSMENT AND PLAN:   1. Stenosis of prosthetic aortic valve, initial encounter   2. Nonrheumatic mitral valve regurgitation   3. S/P CABG x 1   4. Type 2 diabetes mellitus without complication, without long-term current use of insulin  (HCC)   5. Hyperlipidemia associated with type 2 diabetes mellitus (HCC)   6. Aortic  atherosclerosis   7. Recurrent deep vein thrombosis (DVT) (HCC)   8. Secondary hypercoagulable state      Bioprosthetic aortic valve stenosis: Status post TAVR with BASILICA and UNICORN as detailed above.  Continue Plavix 75 mg for now.  Eliquis  currently on hold and will be restarted in a few weeks after his follow-up in [place] .  He is euvolemic on exam.  Given dizziness will have patient stop daily Lasix  and take it on a as needed basis for 3 pound weight gain.  I have asked him to postpone his urologic procedure until the spring.  Patient will follow-up with Dr. Jeffrie his primary cardiologist in January. Mitral regurgitation: Trace mitral regurgitation seen on most recent echocardiogram at outside hospital.   Status post CABG: Continue Plavix 75 mg and aspirin  81 mg given recent stenting of ostial right coronary artery..  Continue rosuvastatin  40 mg T2DM: Continue Plavix 75 mg, rosuvastatin  40 mg, Farxiga  5 mg, consider ARB.   Hyperlipidemia: Continue rosuvastatin  40 mg Aortic atherosclerosis: Continue Plavix 75 mg, aspirin  81 mg, continue rosuvastatin  40 mg Recurrent DVT: Currently on hold; planning on restarting Eliquis  2.5 mg twice daily at 1 month visit in [place] . Secondary hypercoagulable state: Eliquis  currently on hold   I have personally reviewed the patients imaging data as summarized above.  I have reviewed the natural history of aortic stenosis with the patient and family members who are present today. We have discussed the limitations of medical therapy and the poor prognosis associated with symptomatic aortic stenosis. We have also reviewed potential treatment options, including palliative medical therapy, conventional surgical aortic valve replacement, and transcatheter aortic valve replacement. We discussed treatment options in the context of this patient's specific comorbid medical conditions.   All of the patient's questions were answered today. Will make further  recommendations based on the results of studies outlined above.   I spent 35 minutes reviewing all clinical data during and prior to this visit including all relevant imaging studies, laboratories, clinical information from other health systems and prior notes from both Cardiology and other specialties, interviewing the patient, conducting a complete physical examination, and coordinating care in order to formulate a comprehensive and personalized evaluation and treatment plan.   Makalia Bare K Lauryl Seyer, MD  07/10/2024 10:14 AM    [facility] [address] [place], [place], [place]  [address] Phone: [phone]; Fax: [phone]

## 2024-07-07 ENCOUNTER — Other Ambulatory Visit (HOSPITAL_BASED_OUTPATIENT_CLINIC_OR_DEPARTMENT_OTHER): Payer: Self-pay

## 2024-07-07 ENCOUNTER — Other Ambulatory Visit (HOSPITAL_BASED_OUTPATIENT_CLINIC_OR_DEPARTMENT_OTHER): Payer: Self-pay | Admitting: Family Medicine

## 2024-07-08 DIAGNOSIS — K080 Exfoliation of teeth due to systemic causes: Secondary | ICD-10-CM | POA: Diagnosis not present

## 2024-07-09 ENCOUNTER — Encounter: Payer: Self-pay | Admitting: *Deleted

## 2024-07-10 ENCOUNTER — Ambulatory Visit: Attending: Internal Medicine | Admitting: Internal Medicine

## 2024-07-10 ENCOUNTER — Other Ambulatory Visit (HOSPITAL_BASED_OUTPATIENT_CLINIC_OR_DEPARTMENT_OTHER): Payer: Self-pay

## 2024-07-10 ENCOUNTER — Encounter: Payer: Self-pay | Admitting: Internal Medicine

## 2024-07-10 VITALS — BP 110/70 | HR 88 | Ht 71.0 in | Wt 199.8 lb

## 2024-07-10 DIAGNOSIS — Z951 Presence of aortocoronary bypass graft: Secondary | ICD-10-CM | POA: Diagnosis not present

## 2024-07-10 DIAGNOSIS — E1169 Type 2 diabetes mellitus with other specified complication: Secondary | ICD-10-CM

## 2024-07-10 DIAGNOSIS — I340 Nonrheumatic mitral (valve) insufficiency: Secondary | ICD-10-CM | POA: Diagnosis not present

## 2024-07-10 DIAGNOSIS — E119 Type 2 diabetes mellitus without complications: Secondary | ICD-10-CM

## 2024-07-10 DIAGNOSIS — I700 Atherosclerosis of aorta: Secondary | ICD-10-CM

## 2024-07-10 DIAGNOSIS — D6869 Other thrombophilia: Secondary | ICD-10-CM

## 2024-07-10 DIAGNOSIS — T82857D Stenosis of cardiac prosthetic devices, implants and grafts, subsequent encounter: Secondary | ICD-10-CM | POA: Diagnosis not present

## 2024-07-10 DIAGNOSIS — I82409 Acute embolism and thrombosis of unspecified deep veins of unspecified lower extremity: Secondary | ICD-10-CM

## 2024-07-10 DIAGNOSIS — E785 Hyperlipidemia, unspecified: Secondary | ICD-10-CM

## 2024-07-10 DIAGNOSIS — T82857A Stenosis of cardiac prosthetic devices, implants and grafts, initial encounter: Secondary | ICD-10-CM

## 2024-07-10 MED ORDER — CLOPIDOGREL BISULFATE 75 MG PO TABS
75.0000 mg | ORAL_TABLET | Freq: Every day | ORAL | 3 refills | Status: AC
  Filled 2024-07-10 – 2024-09-05 (×3): qty 90, 90d supply, fill #0

## 2024-07-10 NOTE — Addendum Note (Signed)
 Addended by: GORDON RONAL SQUIBB on: 07/10/2024 10:53 AM   Modules accepted: Orders

## 2024-07-10 NOTE — Patient Instructions (Signed)
 Medication Instructions:  CHANGE Lasix  to only as needed  *If you need a refill on your cardiac medications before your next appointment, please call your pharmacy*  Lab Work: None ordered today. If you have labs (blood work) drawn today and your tests are completely normal, you will receive your results only by: MyChart Message (if you have MyChart) OR A paper copy in the mail If you have any lab test that is abnormal or we need to change your treatment, we will call you to review the results.  Testing/Procedures: None ordered today.  Follow-Up: At [facility], you and your health needs are our priority.  As part of our continuing mission to provide you with exceptional heart care, our providers are all part of one team.  This team includes your primary Cardiologist (physician) and Advanced Practice Providers or APPs (Physician Assistants and Nurse Practitioners) who all work together to provide you with the care you need, when you need it.  Your next appointment:   09/18/24   Provider:   Oneil Parchment, MD

## 2024-07-11 DIAGNOSIS — R8290 Unspecified abnormal findings in urine: Secondary | ICD-10-CM | POA: Diagnosis not present

## 2024-07-11 DIAGNOSIS — R300 Dysuria: Secondary | ICD-10-CM | POA: Diagnosis not present

## 2024-07-11 DIAGNOSIS — R350 Frequency of micturition: Secondary | ICD-10-CM | POA: Diagnosis not present

## 2024-07-15 ENCOUNTER — Ambulatory Visit: Admitting: Physician Assistant

## 2024-07-16 DIAGNOSIS — I491 Atrial premature depolarization: Secondary | ICD-10-CM | POA: Diagnosis not present

## 2024-07-16 DIAGNOSIS — I454 Nonspecific intraventricular block: Secondary | ICD-10-CM | POA: Diagnosis not present

## 2024-07-16 DIAGNOSIS — I471 Supraventricular tachycardia, unspecified: Secondary | ICD-10-CM | POA: Diagnosis not present

## 2024-07-16 DIAGNOSIS — I472 Ventricular tachycardia, unspecified: Secondary | ICD-10-CM | POA: Diagnosis not present

## 2024-07-16 NOTE — Progress Notes (Signed)
 Chief complaint: This patient is seen for evaluation of  LUTS   Patient ID: Kirk [name]. is a 78 y.o. male    Thank you for allowing us  to see Kirk [name]..  I have personally reviewed the medical record, including office notes, laboratory results, imaging studies, and I have personally interviewed and examined the patient with the pertinent findings detailed below for our records.  History of Present Illness: Kirk [name]. is a 78 y.o. male  with a PMH that includes history of anal cancer, kidney stones, history of radiation, CAD, left bundle branch block, stenosis of prosthetic aortic valve, diabetes type 2, hyperlipidemia, heart failure, chronic anticoagulation  - GU / Urogyn History:  1.  BPH with LUTS.  At previous clinical visits:  UDS 03/28/24:   Pre-Study Uroflow Summary   Patient voided 50cc.   Residual urine volume: 70cc   Storage Study Method of prolapse reduction was: not applicable   The first urge to void occurred at 29cc, the normal urge to void occurred at 64cc, a strong urge to void occurred at 95cc, and the cystometric capacity was 144cc.  Uninhibited contractions observed There were multiple uninhibited detrusor contractions with initial leakage at a volume of 65 cc. Stress test was not done in this male patient.   Pressure Flow Study Pressure flow study was attempted.   Pt voided 93cc with procedural catheters in place. Some voided volume lost to pads on chair and floor.   Maximum detrusor pressure was 52.5cm.  Detrusor pressure at maximum flow was 42.2cm.  The maximum flow rate was 2.8cc/sec.       EMG Activity EMG Activity was captured   Post Procedure Uroflow  Patient voided 44cc.   Peak urinary flow rate was: 3.7cc/sec.  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  There may be a mild degree of obstruction but he does completely empty.  07 May 2024 Kirk [name] is a 78 year old gentleman who has multiple medical problems as noted below.  He does have a history of anal cancer and has had radiation to that area.  He also has a history of kidney stones.  Please see past medical history for complete list of his past medical illnesses.  He notes severe urgency, frequency and urge incontinence.  He notes has progressed over the last year.  He has an IPSS score of 29. PVR was 0 cc.  Voided cytology was negative for cancer cells.  Office cystoscopy was basically okay from 03/20/2024 and the prostate does not appear to be particularly enlarged on renal ultrasound.  He is currently taking Cymbalta , Farxiga , Eliquis , Ozempic  and his own testosterone  replacement therapy.   11 July 2024: Urgent Care This is a 78 year old male with a history of urinary tract infections presenting with urinary frequency and dysuria for about a week. He is accompanied by his wife.   The patient reports frequent urination approximately every 15 to 20 minutes, burning sensation during urination, and abdominal discomfort localized over the bladder. He also notes cloudy urine but no visible blood in the urine. He reports no fever or chills but mentions experiencing dizziness, malaise, and nausea. His sleep has been significantly disrupted due to these  symptoms, with only 1.5 hours of sleep achieved the previous night. The patient describes his pain level as minimal, but the frequent urination is causing significant distress. He has not tried any over-the-counter medications. He underwent a TAVR procedure two weeks ago and was using a condom catheter postoperatively. He is uncertain if an indwelling  catheter was used during the procedure.  17 July 2024 Today in clinic patient had noted significant improvement in symptoms after starting antibiotic therapy.  Feels that he has been voiding well with minimal urgency and frequency symptoms.  Continues on Myrbetriq  and tolerates it without side effect.  Continues to follow with cardiology and recovering from recent cardiac procedure.  Overall feels that he has been able to hold his urine more and is content waiting to pursue Botox down the road if needed.  Patient denies dysuria, gross hematuria, fever, flank pain. They have not had any abdominal pain, nausea, vomiting, diarrhea/constipation, night sweats, or unintentional weight loss.  Oncology History   No problem history exists.    Medications:  Current Medications[1]  Allergies:  Allergies[2]  Medical History[3]  Surgical History[4]  Social History   Socioeconomic History  . Marital status: Married    Spouse name: Not on file  . Number of children: Not on file  . Years of education: Not on file  . Highest education level: Not on file  Occupational History  . Not on file  Tobacco Use  . Smoking status: Never    Passive exposure: Never  . Smokeless tobacco: Never  Vaping Use  . Vaping status: Never Used  Substance and Sexual Activity  . Alcohol  use: Never  . Drug use: Never  . Sexual activity: Not on file  Other Topics Concern  . Not on file  Social History Narrative  . Not on file   Social Drivers of Health   Food Insecurity: No Food Insecurity (06/30/2024)   Received from [facility]   Food vital sign   . Within the past 12 months, you worried that your food would run out before you got money to buy more: Never true   . Within the past 12 months, the food you bought just didn't last and you didn't have money to get more: Never true  Transportation Needs: No Transportation Needs (06/30/2024)   Received from [facility] - Transportation   . In  the past 12 months, has lack of transportation kept you from medical appointments or from getting medications?: No   . In the past 12 months, has lack of transportation kept you from meetings, work, or from getting things needed for daily living?: No  Safety: Not At Risk (06/30/2024)   Received from [facility]   Safety   . Within the last year, have you been afraid of your partner or ex-partner?: No   . Within the last year, have you been humiliated or emotionally abused in other ways by your partner or ex-partner?: No   . Within the last year, have you been kicked, hit, slapped, or otherwise physically hurt by your partner or ex-partner?: No   . Within the last year, have you been raped or forced to have any kind of sexual activity by your partner or ex-partner?: No  Living Situation: Unknown (06/30/2024)   Received from [facility] Situation   . In the last 12 months, was there a time when you were not able to pay the mortgage or rent on time?: No   .  In the past 12 months, how many times have you moved where you were living?: 1   . Homeless in the Last Year: Not on file     SUBJECTIVE  Review of Systems   A comprehensive ROS was performed with pertinent positives/negatives noted in the HPI. The remainder of the ROS are negative.  Family History[5]  OBJECTIVE Vitals:   07/17/24 1435  BP: 115/70  Pulse:   Resp:   Temp:   SpO2:     Body mass index is 27.48 kg/m.  Physical Examination  Physical Exam Constitutional:      Appearance: Normal appearance.  HENT:     Head: Normocephalic and atraumatic.  Pulmonary:     Effort: Pulmonary effort is normal.     Breath sounds: Normal breath sounds.  Skin:    General: Skin is warm and dry.  Neurological:     General: No focal deficit present.     Mental Status: He is alert and oriented to person, place, and time.  Psychiatric:        Mood and Affect: Mood normal.        Behavior: Behavior normal.        Thought  Content: Thought content normal.        Judgment: Judgment normal.      Urine Culture No components found for: URINEUROCID  Urinalysis   PVR: 94 mL  Data Reviewed  CT ANGIOGRAPHY CHEST, ABDOMEN AND PELVIS 16 May 2024  TECHNIQUE:  Non-contrast CT of the chest was initially obtained.   Multidetector CT imaging through the chest, abdomen and pelvis was  performed using the standard protocol during bolus administration of  intravenous contrast. Multiplanar reconstructed images and MIPs were  obtained and reviewed to evaluate the vascular anatomy.   RADIATION DOSE REDUCTION: This exam was performed according to the  departmental dose-optimization program which includes automated  exposure control, adjustment of the mA and/or kV according to  patient size and/or use of iterative reconstruction technique.   CONTRAST:  OMNIPAQUE  IOHEXOL  350 MG/ML SOLN   COMPARISON:  April 25, 2022.   FINDINGS:  CTA CHEST FINDINGS   Cardiovascular: Status post aortic valve repair. There is no  evidence of thoracic aortic dissection or aneurysm. Status post  coronary bypass graft. Great vessels are widely patent without  stenosis.   Mediastinum/Nodes: Esophagus is unremarkable. Thyroid  gland is  unremarkable. Calcified mediastinal lymph nodes are again noted  suggesting prior granulomatous disease.   Lungs/Pleura: Small right pleural effusion is noted with minimal  adjacent subsegmental atelectasis. Patchy airspace opacities are  noted throughout both lungs most consistent with multifocal  pneumonia.   Musculoskeletal: No chest wall abnormality. No acute or significant  osseous findings.   Review of the MIP images confirms the above findings.   CTA ABDOMEN AND PELVIS FINDINGS   VASCULAR   Aorta: Atherosclerosis of abdominal aorta is noted without aneurysm  or dissection.   Celiac: Patent without evidence of aneurysm, dissection, vasculitis  or significant  stenosis.   SMA: Moderate narrowing is noted at origin secondary to calcified  plaque.   Renals: Both renal arteries are patent without evidence of aneurysm,  dissection, vasculitis, fibromuscular dysplasia or significant  stenosis.   IMA: Severe narrowing is noted at origin secondary to calcified  plaque.   Inflow: Patent without evidence of aneurysm, dissection, vasculitis  or significant stenosis.   Veins: No obvious venous abnormality within the limitations of this  arterial phase study.   Review of  the MIP images confirms the above findings.   NON-VASCULAR   Hepatobiliary: Minimal cholelithiasis. No biliary dilatation. Liver  is unremarkable.   Pancreas: Unremarkable. No pancreatic ductal dilatation or  surrounding inflammatory changes.   Spleen: Normal in size without focal abnormality.   Adrenals/Urinary Tract: Adrenal glands are unremarkable. Kidneys are  normal, without renal calculi, focal lesion, or hydronephrosis.  Bladder is unremarkable.   Stomach/Bowel: Stomach is within normal limits. Appendix appears  normal. No evidence of bowel wall thickening, distention, or  inflammatory changes.   Lymphatic: No adenopathy is noted.   Reproductive: Prostate is unremarkable.   Other: No abdominal wall hernia or abnormality. No abdominopelvic  ascites.   Musculoskeletal: No acute or significant osseous findings.   Review of the MIP images confirms the above findings.   IMPRESSION:  1. Status post aortic valve repair. No evidence of thoracic or  abdominal aortic dissection or aneurysm.  2. Small right pleural effusion is noted with minimal adjacent  subsegmental atelectasis.  3. Patchy airspace opacities are noted throughout both lungs most  consistent with multifocal pneumonia.  4. Minimal cholelithiasis.  5. Aortic atherosclerosis.   CBC No results found for: WBC, RBC, HGB, HCT, MCV, MCH, MCHC, RDW, PLT, MPV  CMP No results found  for: NA, K, CL, CO2, BUN, GLU, CREATININE, CALCIUM , PROT, ALBUMIN , BILITOT, AST, ALT, ANIONGAP, GFR  Creatinine No results found for: CREATININE  A1c  No results found for: HGBA1C, POCA1C  PSA  No results found for: PSA     ASSESSMENT and PLAN 1. Urinary frequency  Urinalysis without Microscopic   Surgical Urine Culture, Sterile Collection   Urinalysis without Microscopic   Surgical Urine Culture, Sterile Collection      78 y.o. male   PVR of 94 mL in clinic today.  Patient continues on Myrbetriq .  Feels that he is doing quite well with minimally bothersome LUTS.  Patient has CT from September 2025 showed no signs of kidney or bladder stones.  Patient was symptomatic UTI that responded to antibiotics.  Patient was not hospitalized.  Will follow-up in 6 months and encouraged him to follow-up with cardiology as scheduled.  Patient is agreeable to plan and all questions answered.   Advised the following: 1.  Follow-up  6 months    Return in about 6 months (around 01/14/2025) for with dr nicholaus.   Shared decision-making: Shared decision-making was conducted with the patient (and family as appropriate) regarding diagnostic and treatment options, incorporating patient values, goals, and understanding of risk.   It has been explained that the patient is to follow regularly with their PCP in addition to all other providers involved in their care and to follow instructions provided by these respective offices. However, patient will contact the clinic if any urologic-pertaining questions, concerns, new symptoms or problems arise in the interim period.   Pt verbalized understanding and agreement.   Total time spent on day of visit: 30-39 minutes  Time calculated for patients includes face-to-face and non face-to-face time that may involve preparing to see patient by reviewing chart, obtaining history and examining patient, placing orders and  documenting in the electronic medical record, and coordination of care.  Orders Placed This Encounter  Procedures  . Surgical Urine Culture, Sterile Collection    Standing Status:   Future    Number of Occurrences:   1    Expected Date:   07/17/2024    Expiration Date:   07/17/2025    Release to patient::  Immediate  . Urinalysis without Microscopic    Standing Status:   Future    Number of Occurrences:   1    Expected Date:   07/17/2024    Expiration Date:   07/17/2025    There are no Patient Instructions on file for this visit.       [1] Current Outpatient Medications  Medication Sig Dispense Refill  . aspirin  81 mg EC tablet Take 81 mg by mouth.    . benzonatate  (TESSALON ) 100 mg capsule Take 1 capsule by mouth in the morning and 1 capsule at noon and 1 capsule in the evening. Take with meals.    . cefPODOXime (VANTIN) 200 mg tablet Take 1 tablet (200 mg total) by mouth 2 (two) times a day for 7 days. 14 tablet 0  . cholecalciferol  (VITAMIN D3) 1,000 unit (25 mcg) tablet 2,000 Units.    . clopidogreL (PLAVIX) 75 mg tablet Take 75 mg by mouth daily.    . DULoxetine  (CYMBALTA ) 60 mg capsule Take 60 mg by mouth daily. with food    . Farxiga  5 mg tab tablet Take 1 tablet by mouth daily.    . furosemide  (LASIX ) 20 mg tablet Take 20 mg by mouth daily.    . levothyroxine  (SYNTHROID ) 25 mcg tablet Take 25 mcg by mouth daily.    SABRA mexiletine (MEXITIL ) 200 mg capsule Take 200 mg by mouth.    . Myrbetriq  25 mg Tb24     . Ozempic  0.25 mg or 0.5 mg (2 mg/3 mL) pen injector Inject 0.25 mg under the skin.    . rosuvastatin  (CRESTOR ) 40 mg tablet Take 40 mg by mouth nightly.    . sulfamethoxazole-trimethoprim (BACTRIM DS) 800-160 mg per tablet Take 1 tablet by mouth 2 (two) times a day for 7 days. 14 tablet 0  . vitamins-lipotropics (Lipo-Flavonoid Plus) 200-100 mg tab Take by mouth daily.    SABRA zinc sulfate (ZINCATE) 220 mg capsule Take 220 mg by mouth daily.    . Eliquis  2.5 mg tab  Take 2.5 mg by mouth 2 (two) times a day. (Patient not taking: Reported on 07/17/2024)    . metoprolol  tartrate (LOPRESSOR ) 25 mg tablet TAKE 1 TABLET BY MOUTH TWICE DAILY (Patient not taking: Reported on 07/17/2024)    . testosterone  cypionate (DEPO-TESTOTERONE) 100 mg/mL injection 50 mg. (Patient not taking: Reported on 07/17/2024)    . triamcinolone  acetonide (KENALOG ) 0.1 % cream APPLY CREAM EXTERNALLY TO AFFECTED AREA TWICE DAILY AS NEEDED FOR ITCHING (Patient not taking: Reported on 07/17/2024)     No current facility-administered medications for this visit.  [2] Allergies Allergen Reactions  . Cat Hair Standardized Allergenic Extract Shortness Of Breath and Swelling  . Hydrocodone  Other (See Comments)  . Allergenic Extracts Other (See Comments) and Swelling  . Atorvastatin  Other (See Comments)    Pt has muscle ache on Atorvastatin  but IS ABLE TO TAKE LIPITOR  without any issues  . Metformin Other (See Comments)    Diarrhea, muscle cramps, Diarrhea, muscle cramps  Diarrhea, muscle cramps  Diarrhea, muscle cramps  Diarrhea, muscle cramps  . Mite Extract Other (See Comments) and Swelling  . Oxycodone  Other (See Comments)    Seizure, PATIENT DOES NOT WANT ANY NARCOTICS  [3] Past Medical History: Diagnosis Date  . Diabetes mellitus   . Disease of thyroid  gland   . Hypertension   [4] Past Surgical History: Procedure Laterality Date  . AORTA SURGERY    . KNEE REPLACEMENT UNICOMPARTMENTAL    [5]  No family history on file.

## 2024-07-17 ENCOUNTER — Telehealth (HOSPITAL_COMMUNITY): Payer: Self-pay

## 2024-07-17 ENCOUNTER — Other Ambulatory Visit (HOSPITAL_BASED_OUTPATIENT_CLINIC_OR_DEPARTMENT_OTHER): Payer: Self-pay

## 2024-07-17 DIAGNOSIS — R350 Frequency of micturition: Secondary | ICD-10-CM | POA: Diagnosis not present

## 2024-07-17 NOTE — Telephone Encounter (Signed)
No response from pt in regards to Cardiac Rehab. Closed referral               

## 2024-07-18 ENCOUNTER — Telehealth: Payer: Self-pay | Admitting: Internal Medicine

## 2024-07-18 DIAGNOSIS — R42 Dizziness and giddiness: Secondary | ICD-10-CM

## 2024-07-18 NOTE — Telephone Encounter (Signed)
 Spoke to  patient 's wife . She stated  she and patient had conversation with Dr Wendel about seeing ENT for patient's dizziness at the last office visit.   Per Mrs Marcussen, she called to get an appointment  with Dr Vaughan Ricker. Dr Ricker' office required a referral.  No documentation of conversation. Will  defer to Dr Wendel   if referral can be placed.

## 2024-07-18 NOTE — Telephone Encounter (Signed)
 Patient's wife says Dr. Wendel recommended following up with an ENT for dizziness. She is requesting to have a referral sent over to  [facility].

## 2024-07-21 ENCOUNTER — Encounter (HOSPITAL_BASED_OUTPATIENT_CLINIC_OR_DEPARTMENT_OTHER): Payer: Self-pay | Admitting: Family Medicine

## 2024-07-21 ENCOUNTER — Other Ambulatory Visit (HOSPITAL_BASED_OUTPATIENT_CLINIC_OR_DEPARTMENT_OTHER): Payer: Self-pay

## 2024-07-21 ENCOUNTER — Ambulatory Visit (INDEPENDENT_AMBULATORY_CARE_PROVIDER_SITE_OTHER): Admitting: Family Medicine

## 2024-07-21 VITALS — BP 140/88 | HR 89 | Ht 71.0 in | Wt 199.8 lb

## 2024-07-21 DIAGNOSIS — Z7985 Long-term (current) use of injectable non-insulin antidiabetic drugs: Secondary | ICD-10-CM

## 2024-07-21 DIAGNOSIS — H8111 Benign paroxysmal vertigo, right ear: Secondary | ICD-10-CM

## 2024-07-21 DIAGNOSIS — R42 Dizziness and giddiness: Secondary | ICD-10-CM | POA: Diagnosis not present

## 2024-07-21 DIAGNOSIS — E1142 Type 2 diabetes mellitus with diabetic polyneuropathy: Secondary | ICD-10-CM

## 2024-07-21 DIAGNOSIS — Z7984 Long term (current) use of oral hypoglycemic drugs: Secondary | ICD-10-CM | POA: Diagnosis not present

## 2024-07-21 MED ORDER — FREESTYLE LIBRE 3 PLUS SENSOR MISC
3 refills | Status: AC
  Filled 2024-07-21: qty 2, 30d supply, fill #0

## 2024-07-21 MED ORDER — ONDANSETRON 4 MG PO TBDP: 4.0000 mg | ORAL_TABLET | Freq: Three times a day (TID) | ORAL | 0 refills | Status: AC | PRN

## 2024-07-21 MED ORDER — FREESTYLE LIBRE 14 DAY SENSOR MISC: 1.0000 | Status: AC

## 2024-07-21 MED ORDER — MECLIZINE HCL 25 MG PO TABS: 25.0000 mg | ORAL_TABLET | Freq: Three times a day (TID) | ORAL | 2 refills | Status: AC | PRN

## 2024-07-21 MED ORDER — FREESTYLE LIBRE 3 PLUS SENSOR MISC: 3 refills | Status: DC

## 2024-07-21 MED ORDER — FLUTICASONE PROPIONATE 50 MCG/ACT NA SUSP: 2.0000 | Freq: Every day | NASAL | 6 refills | Status: AC

## 2024-07-21 NOTE — Telephone Encounter (Signed)
 Referral has been placed and patient's wife (on [place]) has been advised and agrees with plan of care.   Pt's wife reports blood pressure has been elevated at recent office visits. They will monitor blood pressures and will advise if blood pressure continue to trend elevated.

## 2024-07-21 NOTE — Patient Instructions (Signed)
 Flonase- twice daily unless irritation occurs then decrease to once daily.   Meclizine- use up to 3x per day  Zofran - as needed for nausea  Epley Manuevers- twice daily

## 2024-07-21 NOTE — Progress Notes (Deleted)
 Acute Care Office Visit  Subjective:   Kirk [name] 1946-06-02 07/21/2024  Chief Complaint  Patient presents with   Dizziness    Pt has been having problems with feeling dizzy and also will become nauseated. Wants to know if it could be vertigo related or if it could be due to a medication that is currently being taken. Pt has also lost about 30 pounds.    HPI: 78yo male presents with dizziness onset  Duration:  Characteristics:  Aggravating factors:  Relieving factors:  Timing:  Severity:   Treatments tried:   Pt's wife reached out to [facility] in [place] to make an appt with Dr. Vaughan Ricker, however his office requires a referral before pt can make an appt.   Denies chest pain or pressure,    The following portions of the patient's history were reviewed and updated as appropriate: past medical history, past surgical history, family history, social history, allergies, medications, and problem list.   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 2 diabetes mellitus (HCC) 02/04/2024   Overactive bladder 02/04/2024   Peripheral neuropathy 02/04/2024   Polyneuropathy due to type 2 diabetes mellitus (HCC) 02/04/2024   Presence of aortocoronary bypass graft 02/04/2024   Right knee pain 02/04/2024   Bilateral tinnitus 02/04/2024   Anemia 02/04/2024   Osteoarthritis of left knee 11/05/2023   Wellness examination 10/10/2023   Urinary frequency 10/10/2023   Cough  10/04/2023   Morton's toe of right foot 09/27/2023   Ankle pain 09/27/2023   Stiffness of right knee 06/29/2023   Muscle weakness 06/25/2023   Primary osteoarthritis of right knee 06/18/2023   Preoperative clearance 06/06/2023   Foot pain 03/21/2023   Neck pain 02/08/2023   Low testosterone  02/08/2023   Osteoarthritis of knee 11/29/2022   Right leg DVT (HCC) 04/26/2022   Chronic kidney disease, stage 3a (HCC) 04/26/2022   Essential hypertension 04/26/2022   Hypothyroidism 04/26/2022   AKI (acute kidney injury) 04/26/2022   Type 2 diabetes mellitus (HCC) 04/26/2022   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   Cervical myelopathy (HCC) 01/22/2018   Seizures (HCC) 10/13/2013   Obesity 08/11/2013   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   History of anal cancer 04/12/2012   Cancer (HCC) 04/09/2012   Rectal mass 03/20/2012   History of colonic polyps 03/20/2012   Atrial fibrillation (HCC) 08/20/2011   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   Coronary artery disease involving native coronary artery of native heart without angina pectoris 08/07/2011   Aortic stenosis 07/31/2011   Elevated cholesterol    ED (erectile dysfunction)    Kidney stones    Past Medical History:  Diagnosis Date   Allergy    Anal cancer (HCC) 04/2012   Aortic stenosis    valve replacement   Arthritis    1995- cerv. 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   CHF (congestive heart failure) (HCC)    Coronary artery disease    aortic stenosis, CAD   Diabetes mellitus    Type 2   DVT (deep venous thrombosis) (HCC)    ED (erectile dysfunction)    Elevated cholesterol    Heart murmur    no problems   History of kidney stones    passed, lithrotrispy and  surgery   History of radiation therapy 05/20/12-06/28/12   anal cancer=54gy total dose   Hypertension    Hypothyroidism    Kidney stones    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   Malignant neoplasm of other sites of rectum, rectosigmoid junction, and anus 04/12/2012   Myocardial infarction (HCC) 2012   Personal history of colonic polyps 03/20/2012   Pneumonia    Pulmonary embolism (HCC)    Rectal bleeding    Rectal mass 03/20/2012   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   #75mm [facility] Ease pericardial tissue valve    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   LIMA to diagonal branch    Seizure disorder, grand mal (HCC)    only 1   Seizures (HCC)    after taking Oxycodone ; not prescribed seizure med   Shortness of breath    Thrombosed hemorrhoids    Past Surgical History:  Procedure Laterality Date   ANAL FISSURE REPAIR     patient unsure of date   ANTERIOR CERVICAL DECOMP/DISCECTOMY FUSION N/A 01/22/2018   Procedure: ANTERIOR CERVICAL DECOMPRESSION/DISCECTOMY FUSION CERVICAL SIX- CERVICAL SEVEN;  Surgeon: Unice Pac, MD;  Location: [facility] OR;  Service: Neurosurgery;  Laterality: N/A;  ANTERIOR CERVICAL DECOMPRESSION/DISCECTOMY FUSION CERVICAL SIX- CERVICAL SEVEN   AORTIC VALVE REPLACEMENT  08/11/2011   Procedure: AORTIC VALVE REPLACEMENT (AVR);  Surgeon: Sudie VEAR Laine, MD;  Location: [facility] OR;  Service: Open Heart Surgery;  Laterality: N/A;   C4-6 FUSION  1995   DR STERN   COLONOSCOPY     hx polyps   CORONARY ARTERY BYPASS GRAFT  08/16/2011   Procedure: CORONARY ARTERY BYPASS GRAFTING (CABG);  Surgeon: Sudie VEAR Laine, MD;  Location: [facility] OR;  Service: Open Heart Surgery;  Laterality: N/A;  CABG times one using left internal mammary artery   LITHOTRIPSY  2001 AND 2002   DR PETERSON   POLYPECTOMY     colon   RIGHT HEART CATH AND CORONARY/GRAFT ANGIOGRAPHY N/A 05/15/2024   Procedure: RIGHT HEART CATH AND CORONARY/GRAFT ANGIOGRAPHY;  Surgeon: Ladona Heinz, MD;  Location: [facility]  INVASIVE CV LAB;  Service: Cardiovascular;  Laterality: N/A;   TOTAL KNEE ARTHROPLASTY Right 06/18/2023   Procedure: RIGHT TOTAL KNEE ARTHROPLASTY;  Surgeon: Melodi Lerner, MD;  Location: [facility] ORS;  Service: Orthopedics;  Laterality: Right;   TOTAL KNEE ARTHROPLASTY Left 11/05/2023   Procedure: ARTHROPLASTY, KNEE, TOTAL;  Surgeon: Melodi Lerner, MD;  Location: [facility] ORS;  Service: Orthopedics;  Laterality: Left;   TRANSESOPHAGEAL ECHOCARDIOGRAM (CATH LAB) N/A 05/15/2024   Procedure: TRANSESOPHAGEAL ECHOCARDIOGRAM;  Surgeon: Santo Stanly LABOR, MD;  Location: [facility] INVASIVE CV LAB;  Service: Cardiovascular;  Laterality: N/A;   Family History  Problem Relation Age of Onset   Breast cancer Mother    Heart attack Father    Esophageal cancer Maternal Grandfather    Anesthesia problems Neg Hx    Hypotension Neg Hx    Malignant hyperthermia Neg Hx  Pseudochol deficiency Neg Hx    Colon cancer Neg Hx    Colon polyps Neg Hx    Stomach cancer Neg Hx    Rectal cancer Neg Hx    Outpatient Medications Prior to Visit  Medication Sig Dispense Refill   aspirin  EC 81 MG tablet Take 81 mg by mouth daily.     cholecalciferol  (VITAMIN D3) 25 MCG (1000 UNIT) tablet Take 1,000 Units by mouth daily.     clopidogrel (PLAVIX) 75 MG tablet Take 1 tablet (75 mg total) by mouth daily. 90 tablet 3   DULoxetine  (CYMBALTA ) 60 MG capsule Take 1 capsule by mouth at bedtime 90 capsule 0   FARXIGA  5 MG TABS tablet Take 1 tablet by mouth once daily 90 tablet 0   furosemide  (LASIX ) 20 MG tablet Take 1 tablet (20 mg total) by mouth daily. (Patient taking differently: Take 20 mg by mouth as needed for fluid or edema.) 30 tablet 3   levothyroxine  (SYNTHROID ) 50 MCG tablet Take 1 tablet (50 mcg total) by mouth daily before breakfast. 90 tablet 1   mexiletine (MEXITIL ) 200 MG capsule Take 1 capsule (200 mg total) by mouth every 12 (twelve) hours. 60 capsule 3   MYRBETRIQ  25 MG TB24 tablet Take 1 tablet by mouth once daily 90  tablet 0   NEEDLE, DISP, 22 G 22G X 1-1/2 MISC 1 each by Does not apply route once a week. Use to administer medication. 50 each 1   OZEMPIC , 0.25 OR 0.5 MG/DOSE, 2 MG/3ML SOPN INJECT 0.25MG   SUBCUTANEOUSLY ONCE A WEEK 3 mL 0   rosuvastatin  (CRESTOR ) 40 MG tablet TAKE 1 TABLET BY MOUTH AT BEDTIME 90 tablet 0   Vitamins-Lipotropics (LIPO-FLAVONOID PLUS) TABS Take 1 tablet by mouth daily.     zinc sulfate, 50mg  elemental zinc, 220 (50 Zn) MG capsule Take 220 mg by mouth daily.     No facility-administered medications prior to visit.   Allergies  Allergen Reactions   Cat Dander Shortness Of Breath and Swelling   Hydrocodone  Other (See Comments)    Seizures    Atorvastatin  Other (See Comments)    Joint pain   Dust Mite Extract Other (See Comments) and Swelling   Metformin Other (See Comments)    Diarrhea, muscle cramps   Oxycodone  Other (See Comments)    Seizure PATIENT DOES NOT WANT ANY NARCOTICS   Semaglutide (0.25 Or 0.5mg -Dos) Nausea Only    AT HIGHER DOSE     ROS: A complete ROS was performed with pertinent positives/negatives noted in the HPI. The remainder of the ROS are negative.    Objective:   Today's Vitals   07/21/24 1525  Weight: 90.6 kg  Height: 5' 11 (1.803 m)    GENERAL: Well-appearing, in NAD. Well nourished.  SKIN: Pink, warm and dry. No rash, lesion, ulceration, or ecchymoses.  Head: Normocephalic. NECK: Trachea midline. Full ROM w/o pain or tenderness. No lymphadenopathy.  EARS: Tympanic membranes are intact, translucent without bulging and without drainage. Appropriate landmarks visualized.  EYES: Conjunctiva clear without exudates. EOMI, PERRL, no drainage present.  NOSE: Septum midline w/o deformity. Nares patent, mucosa pink and non-inflamed w/o drainage. No sinus tenderness.  THROAT: Uvula midline. Oropharynx clear. Tonsils non-inflamed without exudate. Mucous membranes pink and moist.  RESPIRATORY: Chest wall symmetrical. Respirations even and  non-labored. Breath sounds clear to auscultation bilaterally.  CARDIAC: S1, S2 present, regular rate and rhythm without murmur or gallops. Peripheral pulses 2+ bilaterally.  MSK: Muscle tone and strength appropriate for age.  Joints w/o tenderness, redness, or swelling.  EXTREMITIES: Without clubbing, cyanosis, or edema.  NEUROLOGIC: No motor or sensory deficits. Steady, even gait. C2-C12 intact.  PSYCH/MENTAL STATUS: Alert, oriented x 3. Cooperative, appropriate mood and affect.    No results found for any visits on 07/21/24.    Assessment & Plan:  ***  No orders of the defined types were placed in this encounter.  Lab Orders  No laboratory test(s) ordered today   No images are attached to the encounter or orders placed in the encounter.  No follow-ups on file.    Patient to reach out to office if new, worrisome, or unresolved symptoms arise or if no improvement in patient's condition. Patient verbalized understanding and is agreeable to treatment plan. All questions answered to patient's satisfaction.   Treatment plan and recommendation(s) reviewed by supervising preceptor, Thersia CLEMENTEEN Stark, FNP-C, prior to clinic discharge.    Rosina Ada, BSN, RN DNP Student

## 2024-07-21 NOTE — Progress Notes (Signed)
 Acute Care Office Visit  Subjective:   Kirk [name] [birth year] 07/21/2024  Chief Complaint  Patient presents with   Dizziness    Pt has been having problems with feeling dizzy and also will become nauseated. Wants to know if it could be vertigo related or if it could be due to a medication that is currently being taken. Pt has also lost about 30 pounds.    HPI: Discussed the use of AI scribe software for clinical note transcription with the patient, who gave verbal consent to proceed.  History of Present Illness Kirk [name] is a 78 year old male who presents with dizziness and nausea. He is accompanied by his wife, [name].  He has been experiencing dizziness for several months, which worsens with movement and is relieved when lying flat. The dizziness is described as a 'nuisance' and is associated with nausea and occasional vomiting. No home remedies or medications have alleviated the symptoms.  He underwent heart surgery three weeks ago, but the dizziness predates the surgery. He suspects a possible link to his thyroid  medication, which was increased from 25 mg to 50 mg due to low levels, coinciding with the onset of dizziness.  He has lost approximately 30 pounds, attributed to fluid loss during a hospital stay in September and subsequent dietary changes. He was hospitalized in September for a respiratory infection, which revealed a failing heart valve, leading to [facility] surgery.  He experienced a urinary tract infection last week, treated with antibiotics, and reports no ongoing urinary symptoms. His blood pressure was high during the UTI and remains slightly elevated. He is no longer taking losartan .  He has not been monitoring his blood sugar but recalls an A1c of 5.9 two months ago, down from 6.0.  Wt Readings from Last 3 Encounters:  07/21/24 199 lb 12.8 oz (90.6 kg)  07/10/24 199 lb 12.8 oz (90.6 kg)  06/05/24 203 lb (92.1 kg)      The following  portions of the patient's history were reviewed and updated as appropriate: past medical history, past surgical history, family history, social history, allergies, medications, and problem list.   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 2 diabetes mellitus (HCC) 02/04/2024   Overactive bladder 02/04/2024   Peripheral neuropathy 02/04/2024   Polyneuropathy due to type 2 diabetes mellitus (HCC) 02/04/2024   Presence of aortocoronary bypass graft 02/04/2024   Right knee pain 02/04/2024   Bilateral tinnitus 02/04/2024   Anemia 02/04/2024   Osteoarthritis of left knee 11/05/2023   Wellness examination 10/10/2023   Urinary frequency 10/10/2023   Cough 10/04/2023   Morton's toe of right foot 09/27/2023   Ankle pain 09/27/2023   Stiffness of right knee 06/29/2023   Muscle weakness 06/25/2023   Primary osteoarthritis of right knee 06/18/2023   Preoperative clearance 06/06/2023   Foot pain 03/21/2023   Neck pain 02/08/2023   Low testosterone  02/08/2023   Osteoarthritis of knee 11/29/2022   Right leg DVT (HCC) 04/26/2022   Chronic  kidney disease, stage 3a (HCC) 04/26/2022   Essential hypertension 04/26/2022   Hypothyroidism 04/26/2022   AKI (acute kidney injury) 04/26/2022   Type 2 diabetes mellitus (HCC) 04/26/2022   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   Cervical myelopathy (HCC) 01/22/2018   Seizures (HCC) 10/13/2013   Obesity 08/11/2013   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   History of anal cancer 04/12/2012   Cancer (HCC) 04/09/2012   Rectal mass 03/20/2012   History of colonic polyps 03/20/2012   Atrial fibrillation (HCC) 08/20/2011   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   Coronary artery disease involving native coronary artery of native heart without angina pectoris 08/07/2011   Aortic stenosis 07/31/2011   Elevated cholesterol    ED (erectile dysfunction)    Kidney stones    Past Medical History:  Diagnosis Date   Allergy    Anal cancer (HCC) 04/2012   Aortic stenosis    valve replacement   Arthritis    1995- cerv. 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   CHF (congestive heart failure) (HCC)    Coronary artery disease    aortic stenosis, CAD   Diabetes mellitus    Type 2   DVT (deep venous thrombosis) (HCC)    ED (erectile dysfunction)    Elevated cholesterol    Heart murmur    no problems   History of kidney stones    passed, lithrotrispy and surgery   History of radiation therapy 05/20/12-06/28/12   anal cancer=54gy total dose   Hypertension    Hypothyroidism    Kidney stones    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   Malignant neoplasm of other sites of rectum, rectosigmoid junction, and anus 04/12/2012   Myocardial infarction (HCC) 2012   Personal history of colonic polyps 03/20/2012   Pneumonia    Pulmonary embolism (HCC)    Rectal bleeding    Rectal mass 03/20/2012   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   #19mm [facility] Ease pericardial tissue valve    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   LIMA to diagonal branch    Seizure disorder, grand mal (HCC)    only 1   Seizures (HCC)    after taking Oxycodone ; not prescribed seizure med   Shortness of breath    Thrombosed  hemorrhoids    Past Surgical History:  Procedure Laterality Date   ANAL FISSURE REPAIR     patient unsure of date   ANTERIOR CERVICAL DECOMP/DISCECTOMY FUSION N/A 01/22/2018   Procedure: ANTERIOR CERVICAL DECOMPRESSION/DISCECTOMY FUSION CERVICAL SIX- CERVICAL SEVEN;  Surgeon: Unice Pac, MD;  Location: [facility] OR;  Service: Neurosurgery;  Laterality: N/A;  ANTERIOR CERVICAL DECOMPRESSION/DISCECTOMY FUSION CERVICAL SIX- CERVICAL SEVEN   AORTIC VALVE REPLACEMENT  08/11/2011   Procedure: AORTIC VALVE REPLACEMENT (AVR);  Surgeon: Sudie VEAR Laine, MD;  Location: [facility] OR;  Service: Open Heart Surgery;  Laterality: N/A;   C4-6 FUSION  1995   DR STERN   COLONOSCOPY     hx polyps   CORONARY ARTERY BYPASS GRAFT  08/16/2011   Procedure: CORONARY ARTERY BYPASS GRAFTING (CABG);  Surgeon: Sudie VEAR Laine, MD;  Location: [facility] OR;  Service: Open Heart Surgery;  Laterality: N/A;  CABG times one  using left internal mammary artery   LITHOTRIPSY  2001 AND 2002   DR PETERSON   POLYPECTOMY     colon   RIGHT HEART CATH AND CORONARY/GRAFT ANGIOGRAPHY N/A 05/15/2024   Procedure: RIGHT HEART CATH AND CORONARY/GRAFT ANGIOGRAPHY;  Surgeon: Ladona Heinz, MD;  Location: [facility] INVASIVE CV LAB;  Service: Cardiovascular;  Laterality: N/A;   TOTAL KNEE ARTHROPLASTY Right 06/18/2023   Procedure: RIGHT TOTAL KNEE ARTHROPLASTY;  Surgeon: Melodi Lerner, MD;  Location: [facility] ORS;  Service: Orthopedics;  Laterality: Right;   TOTAL KNEE ARTHROPLASTY Left 11/05/2023   Procedure: ARTHROPLASTY, KNEE, TOTAL;  Surgeon: Melodi Lerner, MD;  Location: [facility] ORS;  Service: Orthopedics;  Laterality: Left;   TRANSESOPHAGEAL ECHOCARDIOGRAM (CATH LAB) N/A 05/15/2024   Procedure: TRANSESOPHAGEAL ECHOCARDIOGRAM;  Surgeon: Santo Stanly LABOR, MD;  Location: [facility] INVASIVE CV LAB;  Service: Cardiovascular;  Laterality: N/A;   Family History  Problem Relation Age of Onset   Breast cancer Mother    Heart attack Father    Esophageal cancer Maternal Grandfather     Anesthesia problems Neg Hx    Hypotension Neg Hx    Malignant hyperthermia Neg Hx    Pseudochol deficiency Neg Hx    Colon cancer Neg Hx    Colon polyps Neg Hx    Stomach cancer Neg Hx    Rectal cancer Neg Hx    Outpatient Medications Prior to Visit  Medication Sig Dispense Refill   aspirin  EC 81 MG tablet Take 81 mg by mouth daily.     cholecalciferol  (VITAMIN D3) 25 MCG (1000 UNIT) tablet Take 1,000 Units by mouth daily.     clopidogrel (PLAVIX) 75 MG tablet Take 1 tablet (75 mg total) by mouth daily. 90 tablet 3   DULoxetine  (CYMBALTA ) 60 MG capsule Take 1 capsule by mouth at bedtime 90 capsule 0   FARXIGA  5 MG TABS tablet Take 1 tablet by mouth once daily 90 tablet 0   furosemide  (LASIX ) 20 MG tablet Take 1 tablet (20 mg total) by mouth daily. (Patient taking differently: Take 20 mg by mouth as needed for fluid or edema.) 30 tablet 3   levothyroxine  (SYNTHROID ) 50 MCG tablet Take 1 tablet (50 mcg total) by mouth daily before breakfast. 90 tablet 1   mexiletine (MEXITIL ) 200 MG capsule Take 1 capsule (200 mg total) by mouth every 12 (twelve) hours. 60 capsule 3   MYRBETRIQ  25 MG TB24 tablet Take 1 tablet by mouth once daily 90 tablet 0   NEEDLE, DISP, 22 G 22G X 1-1/2 MISC 1 each by Does not apply route once a week. Use to administer medication. 50 each 1   OZEMPIC , 0.25 OR 0.5 MG/DOSE, 2 MG/3ML SOPN INJECT 0.25MG   SUBCUTANEOUSLY ONCE A WEEK 3 mL 0   rosuvastatin  (CRESTOR ) 40 MG tablet TAKE 1 TABLET BY MOUTH AT BEDTIME 90 tablet 0   sulfamethoxazole-trimethoprim (BACTRIM DS) 800-160 MG tablet Take 1 tablet by mouth.     Vitamins-Lipotropics (LIPO-FLAVONOID PLUS) TABS Take 1 tablet by mouth daily.     zinc sulfate, 50mg  elemental zinc, 220 (50 Zn) MG capsule Take 220 mg by mouth daily.     No facility-administered medications prior to visit.   Allergies  Allergen Reactions   Cat Dander Shortness Of Breath and Swelling   Hydrocodone  Other (See Comments)    Seizures     Atorvastatin  Other (See Comments)    Joint pain   Dust Mite Extract Other (See Comments) and Swelling   Metformin Other (See Comments)  Diarrhea, muscle cramps   Oxycodone  Other (See Comments)    Seizure PATIENT DOES NOT WANT ANY NARCOTICS   Semaglutide (0.25 Or 0.5mg -Dos) Nausea Only    AT HIGHER DOSE     ROS: A complete ROS was performed with pertinent positives/negatives noted in the HPI. The remainder of the ROS are negative.    Objective:   Today's Vitals   07/21/24 1525 07/21/24 1634  BP: (!) 150/80 (!) 140/88  Pulse: 89   SpO2: 100%   Weight: 199 lb 12.8 oz (90.6 kg)   Height: 5' 11 (1.803 m)     GENERAL: Well-appearing, in NAD. Well nourished.  SKIN: Pink, warm and dry. Head: Normocephalic. NECK: Trachea midline. Full ROM w/o pain or tenderness. No lymphadenopathy.  EARS: Tympanic membranes are intact, translucent without bulging and without drainage. Appropriate landmarks visualized.  EYES: Conjunctiva clear without exudates. EOMI, PERRL, no drainage present.  NOSE: Septum midline w/o deformity. Nares patent, mucosa pink and non-inflamed w/o drainage. No sinus tenderness.  THROAT: Uvula midline. Oropharynx clear.  Mucous membranes pink and moist.  RESPIRATORY: Chest wall symmetrical. Respirations even and non-labored. Breath sounds clear to auscultation bilaterally.  CARDIAC: S1, S2 present, regular rate and rhythm without murmur or gallops. Peripheral pulses 2+ bilaterally.  MSK: Muscle tone and strength appropriate for age. Joints w/o tenderness, redness, or swelling.  EXTREMITIES: Without clubbing, cyanosis, or edema.  NEUROLOGIC: No motor or sensory deficits. Steady, even gait. C2-C12 intact. + Nystagmus right side with Dix Hallpike maneuver.  PSYCH/MENTAL STATUS: Alert, oriented x 3. Cooperative, appropriate mood and affect.    No results found for any visits on 07/21/24.    Assessment & Plan:  1. Benign paroxysmal positional vertigo of right ear  (Primary) Will treat BPPV with Flonase BID, Meclizine TID PRN, and Zofran  PRN. Safe use of medications reviewed. He will perform Epley maneuvers twice daily. Referral placed to ENT and Vestibular therapy if no improvement in 2-3 weeks.  - fluticasone (FLONASE) 50 MCG/ACT nasal spray; Place 2 sprays into both nostrils daily.  Dispense: 16 g; Refill: 6 - meclizine (ANTIVERT) 25 MG tablet; Take 1 tablet (25 mg total) by mouth 3 (three) times daily as needed for dizziness.  Dispense: 30 tablet; Refill: 2 - ondansetron  (ZOFRAN -ODT) 4 MG disintegrating tablet; Take 1 tablet (4 mg total) by mouth every 8 (eight) hours as needed for nausea or vomiting.  Dispense: 20 tablet; Refill: 0 - Ambulatory referral to ENT - Ambulatory referral to Physical Therapy  2. Dizziness Will check TSH, CBC and BMP with labs today given ongoing concern of dizziness.  - TSH Rfx on Abnormal to Free T4 - CBC with Differential/Platelet - Basic metabolic panel with GFR  3. Type 2 diabetes mellitus with diabetic polyneuropathy, without long-term current use of insulin  [facility]) Patient requesting CGM sensor to monitor blood glucose. CGM Sensor sample Freestyle Libre applied in office with education regarding use.  - Continuous Glucose Sensor (FREESTYLE LIBRE 3 PLUS SENSOR) MISC; Change sensor every 15 days.  Dispense: 2 each; Refill: 3   Meds ordered this encounter  Medications   fluticasone (FLONASE) 50 MCG/ACT nasal spray    Sig: Place 2 sprays into both nostrils daily.    Dispense:  16 g    Refill:  6    Supervising Provider:   DE CUBA, RAYMOND J [[id number]]   meclizine (ANTIVERT) 25 MG tablet    Sig: Take 1 tablet (25 mg total) by mouth 3 (three) times daily as needed for dizziness.  Dispense:  30 tablet    Refill:  2    Supervising Provider:   DE CUBA, RAYMOND J [[id number]]   ondansetron  (ZOFRAN -ODT) 4 MG disintegrating tablet    Sig: Take 1 tablet (4 mg total) by mouth every 8 (eight) hours as needed for nausea or  vomiting.    Dispense:  20 tablet    Refill:  0    Supervising Provider:   DE CUBA, RAYMOND J [[id number]]   DISCONTD: Continuous Glucose Sensor (FREESTYLE LIBRE 3 PLUS SENSOR) MISC    Sig: Change sensor every 15 days.    Dispense:  2 each    Refill:  3    Supervising Provider:   DE CUBA, RAYMOND J [[id number]]   Continuous Glucose Sensor (FREESTYLE LIBRE 3 PLUS SENSOR) MISC    Sig: Change sensor every 15 days.    Dispense:  2 each    Refill:  3    Supervising Provider:   DE CUBA, RAYMOND J [[id number]]   Continuous Glucose Sensor (FREESTYLE LIBRE 14 DAY SENSOR) MISC    Sig: 1 Application by Does not apply route every 14 (fourteen) days. Apply upper deltoid every 14 days, use reader to determine blood sugars    Supervising Provider:   DE CUBA, RAYMOND J [[id number]]    Lot Number?:   [id number]    Expiration Date?:   09/03/2024    Manufacturer?:   [employer] [1]    Quantity:   1   Lab Orders         TSH Rfx on Abnormal to Free T4         CBC with Differential/Platelet         Basic metabolic panel with GFR     Return if symptoms worsen or fail to improve.    Patient to reach out to office if new, worrisome, or unresolved symptoms arise or if no improvement in patient's condition. Patient verbalized understanding and is agreeable to treatment plan. All questions answered to patient's satisfaction.    Thersia Schuyler Stark, [place]

## 2024-07-22 ENCOUNTER — Ambulatory Visit (HOSPITAL_BASED_OUTPATIENT_CLINIC_OR_DEPARTMENT_OTHER): Payer: Self-pay | Admitting: Family Medicine

## 2024-07-22 DIAGNOSIS — N289 Disorder of kidney and ureter, unspecified: Secondary | ICD-10-CM

## 2024-07-22 LAB — CBC WITH DIFFERENTIAL/PLATELET
Basophils Absolute: 0.1 x10E3/uL (ref 0.0–0.2)
Basos: 1 %
EOS (ABSOLUTE): 0.2 x10E3/uL (ref 0.0–0.4)
Eos: 2 %
Hematocrit: 46.9 % (ref 37.5–51.0)
Hemoglobin: 15.5 g/dL (ref 13.0–17.7)
Immature Grans (Abs): 0.1 x10E3/uL (ref 0.0–0.1)
Immature Granulocytes: 1 %
Lymphocytes Absolute: 0.7 x10E3/uL (ref 0.7–3.1)
Lymphs: 8 %
MCH: 30.9 pg (ref 26.6–33.0)
MCHC: 33 g/dL (ref 31.5–35.7)
MCV: 93 fL (ref 79–97)
Monocytes Absolute: 0.6 x10E3/uL (ref 0.1–0.9)
Monocytes: 7 %
Neutrophils Absolute: 7.4 x10E3/uL — ABNORMAL HIGH (ref 1.4–7.0)
Neutrophils: 81 %
Platelets: 212 x10E3/uL (ref 150–450)
RBC: 5.02 x10E6/uL (ref 4.14–5.80)
RDW: 13.6 % (ref 11.6–15.4)
WBC: 8.9 x10E3/uL (ref 3.4–10.8)

## 2024-07-22 LAB — BASIC METABOLIC PANEL WITH GFR
BUN/Creatinine Ratio: 12 (ref 10–24)
BUN: 16 mg/dL (ref 8–27)
CO2: 23 mmol/L (ref 20–29)
Calcium: 10.2 mg/dL (ref 8.6–10.2)
Chloride: 99 mmol/L (ref 96–106)
Creatinine, Ser: 1.29 mg/dL — ABNORMAL HIGH (ref 0.76–1.27)
Glucose: 114 mg/dL — ABNORMAL HIGH (ref 70–99)
Potassium: 5.4 mmol/L — ABNORMAL HIGH (ref 3.5–5.2)
Sodium: 138 mmol/L (ref 134–144)
eGFR: 57 mL/min/1.73 — ABNORMAL LOW (ref >59)

## 2024-07-22 LAB — TSH RFX ON ABNORMAL TO FREE T4: TSH: 4.03 u[IU]/mL (ref 0.450–4.500)

## 2024-07-22 NOTE — Progress Notes (Signed)
 Hi Kirk [name],  Your blood counts are stable without signs of anemia or acute infection. Your kidney function was slightly decrease from prior and your potassium was slightly elevated. I would recommend increasing your clear fluid intake, decrease caffeine consumption and recheck in 1-2 weeks.  Thyroid  function is stable. Please call the office to schedule a repeat lab appt in 1-2 weeks.

## 2024-07-28 ENCOUNTER — Other Ambulatory Visit (HOSPITAL_BASED_OUTPATIENT_CLINIC_OR_DEPARTMENT_OTHER): Payer: Self-pay | Admitting: Family Medicine

## 2024-07-30 ENCOUNTER — Telehealth: Payer: Self-pay | Admitting: Internal Medicine

## 2024-07-30 NOTE — Telephone Encounter (Signed)
  Patient's wife said she called Dr. Carlie office and she was told they haven't receive the referral from Dr. Wendel

## 2024-08-01 ENCOUNTER — Encounter (INDEPENDENT_AMBULATORY_CARE_PROVIDER_SITE_OTHER): Payer: Self-pay

## 2024-08-01 ENCOUNTER — Other Ambulatory Visit (HOSPITAL_BASED_OUTPATIENT_CLINIC_OR_DEPARTMENT_OTHER): Payer: Self-pay | Admitting: Family Medicine

## 2024-08-01 DIAGNOSIS — E039 Hypothyroidism, unspecified: Secondary | ICD-10-CM

## 2024-08-04 NOTE — Telephone Encounter (Signed)
 Mychart sent by pt's spouse checking on status of ENT referral that was placed at pt's last visit. Routing to [place] for assistance with this.

## 2024-08-05 DIAGNOSIS — E039 Hypothyroidism, unspecified: Secondary | ICD-10-CM | POA: Diagnosis not present

## 2024-08-05 DIAGNOSIS — I050 Rheumatic mitral stenosis: Secondary | ICD-10-CM | POA: Diagnosis not present

## 2024-08-05 DIAGNOSIS — R112 Nausea with vomiting, unspecified: Secondary | ICD-10-CM | POA: Diagnosis not present

## 2024-08-05 DIAGNOSIS — E119 Type 2 diabetes mellitus without complications: Secondary | ICD-10-CM | POA: Diagnosis not present

## 2024-08-05 DIAGNOSIS — I350 Nonrheumatic aortic (valve) stenosis: Secondary | ICD-10-CM | POA: Diagnosis not present

## 2024-08-05 DIAGNOSIS — R9431 Abnormal electrocardiogram [ECG] [EKG]: Secondary | ICD-10-CM | POA: Diagnosis not present

## 2024-08-05 DIAGNOSIS — R42 Dizziness and giddiness: Secondary | ICD-10-CM | POA: Diagnosis not present

## 2024-08-05 DIAGNOSIS — I491 Atrial premature depolarization: Secondary | ICD-10-CM | POA: Diagnosis not present

## 2024-08-05 DIAGNOSIS — I447 Left bundle-branch block, unspecified: Secondary | ICD-10-CM | POA: Diagnosis not present

## 2024-08-05 DIAGNOSIS — Z953 Presence of xenogenic heart valve: Secondary | ICD-10-CM | POA: Diagnosis not present

## 2024-08-05 NOTE — ED Notes (Addendum)
 Pt was given and explained discharge instructions and medications if prescribed. Pt was given opportunity to ask or state any questions or concerns. Pt voices understanding of discharge instructions and provides teach back of information given and states no concerns or further questions. Pt has all personal items. VSS for pt baseline. NAD. No concerning changes in pt's condition. Pt voices no further concerns or needs at this time. Pt was taken by wheelchair out of department with family

## 2024-08-05 NOTE — ED Notes (Signed)
 Patient able to eat sandwich and ginger ale without complications.

## 2024-08-05 NOTE — ED Triage Notes (Signed)
 Pt arrives with EMS from cardiology appointment c/o dizziness, nausea, and a fall today in the bathroom- denies LOC. + thinners. Symptoms for 2 wks.

## 2024-08-05 NOTE — ED Notes (Signed)
 Bed: E63 Expected date: 08/05/24 Expected time: 5:17 PM Means of arrival: [facility] - EMS DEPARTMENT [phone]) Comments: M101 69 yom Fall, dizziness from vertigo NV 136/78 108 [id number]

## 2024-08-06 ENCOUNTER — Ambulatory Visit

## 2024-08-06 DIAGNOSIS — R9431 Abnormal electrocardiogram [ECG] [EKG]: Secondary | ICD-10-CM | POA: Diagnosis not present

## 2024-08-06 DIAGNOSIS — I454 Nonspecific intraventricular block: Secondary | ICD-10-CM | POA: Diagnosis not present

## 2024-08-06 DIAGNOSIS — I491 Atrial premature depolarization: Secondary | ICD-10-CM | POA: Diagnosis not present

## 2024-08-07 ENCOUNTER — Other Ambulatory Visit (HOSPITAL_BASED_OUTPATIENT_CLINIC_OR_DEPARTMENT_OTHER): Payer: Self-pay | Admitting: Family Medicine

## 2024-08-07 DIAGNOSIS — E1142 Type 2 diabetes mellitus with diabetic polyneuropathy: Secondary | ICD-10-CM

## 2024-08-08 ENCOUNTER — Ambulatory Visit: Attending: Family Medicine

## 2024-08-08 VITALS — BP 102/58 | HR 102

## 2024-08-08 DIAGNOSIS — H8111 Benign paroxysmal vertigo, right ear: Secondary | ICD-10-CM | POA: Diagnosis not present

## 2024-08-08 DIAGNOSIS — R2681 Unsteadiness on feet: Secondary | ICD-10-CM | POA: Diagnosis present

## 2024-08-08 DIAGNOSIS — R42 Dizziness and giddiness: Secondary | ICD-10-CM | POA: Diagnosis present

## 2024-08-08 NOTE — Therapy (Signed)
 OUTPATIENT PHYSICAL THERAPY VESTIBULAR EVALUATION     Patient Name: Kirk [name] MRN: [id number] DOB:08-11-1946, 78 y.o., male Today's Date: 08/08/2024  END OF SESSION:  PT End of Session - 08/08/24 0845     Visit Number 1    Number of Visits 9    Date for Recertification  09/05/24    Authorization Type BCBS Medicare    PT Start Time [phone]    PT Stop Time 0950    PT Time Calculation (min) 64 min    Activity Tolerance Patient tolerated treatment well    Behavior During Therapy [facility] for tasks assessed/performed          Past Medical History:  Diagnosis Date   Allergy    Anal cancer (HCC) 04/2012   Aortic stenosis    valve replacement   Arthritis    1995- cerv. 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   CHF (congestive heart failure) (HCC)    Coronary artery disease    aortic stenosis, CAD   Diabetes mellitus    Type 2   DVT (deep venous thrombosis) (HCC)    ED (erectile dysfunction)    Elevated cholesterol    Heart murmur    no problems   History of kidney stones    passed, lithrotrispy and surgery   History of radiation therapy 05/20/12-06/28/12   anal cancer=54gy total dose   Hypertension    Hypothyroidism    Kidney stones    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   Malignant neoplasm of other sites of rectum, rectosigmoid junction, and anus 04/12/2012   Myocardial infarction (HCC) 2012   Personal history of colonic polyps 03/20/2012   Pneumonia    Pulmonary embolism (HCC)    Rectal bleeding    Rectal mass 03/20/2012   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   #24mm [facility] Ease pericardial tissue valve    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   LIMA to diagonal branch    Seizure disorder, grand mal (HCC)    only 1   Seizures (HCC)    after taking Oxycodone ; not prescribed seizure med    Shortness of breath    Thrombosed hemorrhoids    Past Surgical History:  Procedure Laterality Date   ANAL FISSURE REPAIR     patient unsure of date   ANTERIOR CERVICAL DECOMP/DISCECTOMY FUSION N/A 01/22/2018   Procedure: ANTERIOR CERVICAL DECOMPRESSION/DISCECTOMY FUSION CERVICAL SIX- CERVICAL SEVEN;  Surgeon: Unice Pac, MD;  Location: [facility] OR;  Service: Neurosurgery;  Laterality: N/A;  ANTERIOR CERVICAL DECOMPRESSION/DISCECTOMY FUSION CERVICAL SIX- CERVICAL SEVEN   AORTIC VALVE REPLACEMENT  08/11/2011   Procedure: AORTIC VALVE REPLACEMENT (AVR);  Surgeon: Sudie VEAR Laine, MD;  Location: [facility] OR;  Service: Open Heart Surgery;  Laterality: N/A;   C4-6 FUSION  1995   DR STERN   COLONOSCOPY     hx polyps   CORONARY ARTERY BYPASS GRAFT  08/16/2011   Procedure: CORONARY ARTERY BYPASS GRAFTING (CABG);  Surgeon: Sudie VEAR Laine, MD;  Location: [facility] OR;  Service: Open Heart Surgery;  Laterality: N/A;  CABG times one using left internal mammary artery   LITHOTRIPSY  2001 AND 2002   DR PETERSON   POLYPECTOMY     colon   RIGHT HEART CATH AND CORONARY/GRAFT ANGIOGRAPHY N/A 05/15/2024   Procedure: RIGHT HEART  CATH AND CORONARY/GRAFT ANGIOGRAPHY;  Surgeon: Ladona Heinz, MD;  Location: [facility] INVASIVE CV LAB;  Service: Cardiovascular;  Laterality: N/A;   TOTAL KNEE ARTHROPLASTY Right 06/18/2023   Procedure: RIGHT TOTAL KNEE ARTHROPLASTY;  Surgeon: Melodi Lerner, MD;  Location: [facility] ORS;  Service: Orthopedics;  Laterality: Right;   TOTAL KNEE ARTHROPLASTY Left 11/05/2023   Procedure: ARTHROPLASTY, KNEE, TOTAL;  Surgeon: Melodi Lerner, MD;  Location: [facility] ORS;  Service: Orthopedics;  Laterality: Left;   TRANSESOPHAGEAL ECHOCARDIOGRAM (CATH LAB) N/A 05/15/2024   Procedure: TRANSESOPHAGEAL ECHOCARDIOGRAM;  Surgeon: Santo Stanly LABOR, MD;  Location: [facility] INVASIVE CV LAB;  Service: Cardiovascular;  Laterality: N/A;   Patient Active Problem List   Diagnosis Date Noted   Acute on chronic diastolic heart failure (HCC)  90/86/7974   Stenosis of prosthetic aortic valve 05/17/2024   Acute bronchitis 05/14/2024   Acute CHF (congestive heart failure) (HCC) 05/14/2024   Acute respiratory failure with hypoxia (HCC) 05/14/2024   Acute hypoxemic respiratory failure (HCC) 05/13/2024   Chronic kidney disease due to hypertension 02/04/2024   Decreased pedal pulses 02/04/2024   Hypogonadism in male 02/04/2024   Insomnia 02/04/2024   Neuropathy due to type 2 diabetes mellitus (HCC) 02/04/2024   Overactive bladder 02/04/2024   Peripheral neuropathy 02/04/2024   Polyneuropathy due to type 2 diabetes mellitus (HCC) 02/04/2024   Presence of aortocoronary bypass graft 02/04/2024   Right knee pain 02/04/2024   Bilateral tinnitus 02/04/2024   Anemia 02/04/2024   Osteoarthritis of left knee 11/05/2023   Wellness examination 10/10/2023   Urinary frequency 10/10/2023   Cough 10/04/2023   Morton's toe of right foot 09/27/2023   Ankle pain 09/27/2023   Stiffness of right knee 06/29/2023   Muscle weakness 06/25/2023   Primary osteoarthritis of right knee 06/18/2023   Preoperative clearance 06/06/2023   Foot pain 03/21/2023   Neck pain 02/08/2023   Low testosterone  02/08/2023   Osteoarthritis of knee 11/29/2022   Right leg DVT (HCC) 04/26/2022   Chronic kidney disease, stage 3a (HCC) 04/26/2022   Essential hypertension 04/26/2022   Hypothyroidism 04/26/2022   AKI (acute kidney injury) 04/26/2022   Type 2 diabetes mellitus (HCC) 04/26/2022   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   Cervical myelopathy (HCC) 01/22/2018   Seizures (HCC) 10/13/2013   Obesity 08/11/2013   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11 (ICD-10-CM) - Benign paroxysmal positional vertigo of right ear   THERAPY DIAG:  Dizziness and giddiness - Plan: PT plan of care cert/re-cert  Unsteadiness on feet - Plan: PT plan of care cert/re-cert  ONSET DATE: 07/21/24 referral   Rationale for Evaluation and Treatment: Rehabilitation  SUBJECTIVE:   SUBJECTIVE STATEMENT: Patient arrives to clinic with SO, [name]. He reports feeling woozy. He denies room-spinning dizziness. 6-8 dizziness he had very mild dizziness, but it has gotten progressively worse. This Tuesday he was driving to [facility]  for a heart dr appt and got soi car sick he had ot go to the ER for fluids d/t N/V. Per patient, doctors think he has vertigo. Does endorse intermittent ear aches, R>L. Last took meclizine  this AM, but reports that this does not help.  Pt accompanied by: significant other  PERTINENT HISTORY: anal cancer, aortic stenosis s/p TAVR, CHF, CAD, DM, DVT, HLD, HTN, MI, PE, CABG x1, seizure  PAIN:  Are you having pain? No Dizziness: 4/5  PRECAUTIONS: Fall   WEIGHT BEARING RESTRICTIONS: No  FALLS: Has patient fallen in last 6 months? Yes. Number of falls 1; rolled off the toilet; multiple near falls   LIVING ENVIRONMENT: Lives with: lives with their family Lives in: House/apartment Stairs: Yes: External: 3 steps; on right going up Has following equipment at home: Single point cane, Walker - 2 wheeled, and shower chair  PLOF: Independent; driving when feel safe  PATIENT GOALS: help me get this out of my head  OBJECTIVE:  Note: Objective measures were completed at Evaluation unless otherwise noted.  DIAGNOSTIC FINDINGS: none  relevant   COGNITION: Overall cognitive status: Within functional limits for tasks assessed   SENSATION: Neuropathy in B feet   POSTURE:  rounded shoulders, forward head, increased thoracic kyphosis, and posterior pelvic tilt  Cervical ROM:   C4-6 fused    STRENGTH: WFL   BED MOBILITY:  Independent, but denies room spinning dizziness   GAIT: Gait pattern: step through pattern and wide BOS   VESTIBULAR ASSESSMENT:  GENERAL OBSERVATION: NAD, intermittently using SPC   SYMPTOM BEHAVIOR:  Subjective history: see above  Non-Vestibular symptoms: changes in hearing, changes in vision, headaches, and nausea/vomiting birds chirping in R ear, bells ringing in L ear, blurred vision at a distance   Type of dizziness: Imbalance (Disequilibrium) and wooziness; like I'm on a playground swing   Frequency: daily  Duration: seconds to at most 1.5hours   Aggravating factors: Spontaneous, Induced by position change: supine to sit and sit to stand, and Induced by motion: occur when walking, turning body quickly, turning head quickly, sitting in a moving car, and activity in general  Relieving factors: rest  Progression of symptoms: worse  OCULOMOTOR EXAM:  Ocular Alignment: normal  Ocular ROM: No Limitations  Spontaneous Nystagmus: absent  Gaze-Induced Nystagmus: absent  Smooth Pursuits: saccades  Saccades: hypometric/undershoots  Convergence/Divergence: 8 cm   Cover-cross-cover test: Deviations: R eye slightly exophoric    VESTIBULAR - OCULAR REFLEX:   Slow VOR: Normal and Comment: limited by cervical guarding   VOR Cancellation: Comment: normal, but limited by cervical guarding  Head-Impulse Test: HIT Right: positive HIT Left: negative B limited by cervical guarding   Dynamic Visual Acuity: TBA PRN   POSITIONAL TESTING: Right Dix-Hallpike: no nystagmus Left Dix-Hallpike: no nystagmus and patient reporting seeing ceiling move Right Roll Test: no nystagmus Left Roll Test: no  nystagmus  MOTION SENSITIVITY:  Motion Sensitivity Quotient Intensity: 0 = none, 1 = Lightheaded, 2 = Mild, 3 = Moderate, 4 = Severe, 5 = Vomiting  Intensity  1. Sitting to supine 0  2. Supine to L side   3. Supine to R side   4. Supine to sitting 0  5. L Hallpike-Dix 1  6. Up from L  1  7. R Hallpike-Dix 0  8. Up from R  0  9. Sitting, head tipped to L knee 0  10. Head up from L knee 0  11. Sitting, head tipped to R knee 0  12. Head  up from R knee 0  13. Sitting head turns x5   14.Sitting head nods x5   15. In stance, 180 turn to L    16. In stance, 180 turn to R     Vitals:   08/08/24 0921 08/08/24 0922  BP: 113/68 (!) 102/58  Pulse: (!) 101 (!) 102                                                                                                                             TREATMENT  Self care/ home management: -extensive ed on ddx, pathophys of vertigo  -initial HEP   NMR:  Habituation:  Brandt-Daroff: number of reps: 1   PATIENT EDUCATION: Education details: PT POC, HEP, exam findings, see above Person educated: Patient and Spouse Education method: Explanation, Demonstration, and Handouts Education comprehension: verbalized understanding and needs further education  HOME EXERCISE PROGRAM: -brandt daroff   GOALS: Goals reviewed with patient? Yes  SHORT TERM GOALS:= LTG based on PT POC length   LONG TERM GOALS: Target date: 09/05/24  Pt will be independent with final HEP for improved symptom report  Baseline: to be updated  Goal status: INITIAL  2.  Patient will demonstrate (-) positional testing to indicate resolution of BPPV  Baseline: to be retested when patient has not taken meclizine  Goal status: INITIAL  3.  MCTSIB goal  Baseline: to be assessed Goal status: INITIAL  4.  DHI goal  Baseline: to be assessed Goal status: INITIAL   ASSESSMENT:  CLINICAL IMPRESSION: Patient is a 78 y.o. male who was seen today for physical therapy  evaluation and treatment for dizziness. In clinic, positional testing was normal, but given that patient had taken meclizine  just prior to appt, unable to effectively state whether or not patient truly has BPPV as underlying cause. Given that the patient experiences near constant dizziness, it is likely that patient has additional reason for his dizziness, if he does in fact have BPPV as well. His slightly saccadic smooth pursuits and extended convergence can be explained by age and those findings alone would not explain the patients symptoms. He does report a rather unique pattern to his dizziness after he takes his medications, which may suggest a level of influence on his dizziness from his medications. The patient would benefit from skilled PT services to address the above mentioned deficits.   OBJECTIVE IMPAIRMENTS: Abnormal gait, decreased knowledge of condition, dizziness, and impaired vision/preception.   ACTIVITY LIMITATIONS: sleeping, bed mobility, reach over head, hygiene/grooming, locomotion level, and caring for others  PARTICIPATION LIMITATIONS: meal prep, cleaning, interpersonal relationship, driving, shopping, community activity, and yard work  PERSONAL FACTORS: Age, Fitness, Past/current experiences, Time since onset of injury/illness/exacerbation, Transportation, and 3+ comorbidities: see above are also affecting patient's functional outcome.   REHAB POTENTIAL: Fair unknown etiology   CLINICAL DECISION MAKING: Unstable/unpredictable  EVALUATION COMPLEXITY: High   PLAN:  PT FREQUENCY: 2x/week  PT DURATION: 4 weeks  PLANNED INTERVENTIONS: 02835-  PT Re-evaluation, 97750- Physical Performance Testing, 97110-Therapeutic exercises, 97530- Therapeutic activity, 97112- Neuromuscular re-education, [phone]- Self Care, 02859- Manual therapy, [phone]- Gait training, [phone]- Canalith repositioning, [phone]- Aquatic Therapy, [phone] (1-2 muscles), [phone] (3+ muscles)- Dry Needling, Patient/Family  education, Balance training, Stair training, Vestibular training, Visual/preceptual remediation/compensation, Cognitive remediation, and DME instructions  PLAN FOR NEXT SESSION: DHI, MCTSIB, re-check positional + goals, trial binasal occlusion given motion sensitivity reports   Delon DELENA Pop, PT Delon DELENA Pop, PT, DPT, CBIS, CFVRS  08/08/2024, 12:57 PM

## 2024-08-11 ENCOUNTER — Ambulatory Visit

## 2024-08-12 ENCOUNTER — Telehealth: Payer: Self-pay | Admitting: Cardiology

## 2024-08-12 DIAGNOSIS — M26643 Arthritis of bilateral temporomandibular joint: Secondary | ICD-10-CM | POA: Diagnosis not present

## 2024-08-12 DIAGNOSIS — R42 Dizziness and giddiness: Secondary | ICD-10-CM | POA: Diagnosis not present

## 2024-08-12 DIAGNOSIS — H9203 Otalgia, bilateral: Secondary | ICD-10-CM | POA: Diagnosis not present

## 2024-08-12 DIAGNOSIS — H9311 Tinnitus, right ear: Secondary | ICD-10-CM | POA: Diagnosis not present

## 2024-08-12 NOTE — Telephone Encounter (Signed)
 Called and spoke to wife. She is VERY concerned that the Mexitil  that was prescribed after hospitalization in 05/2024 is causing severe Vertigo like symptoms. Pt is very dizzy and nauseated, and also vomiting. She states that she read he should not be taking this medication along with Duloxetine  (Cymbalta ). He always has the symptoms but many times, about 45 min to 1.5 hours after taking the Mexitil , then the symptoms WORSEN.   She would like pt to STOP this medication (Mexitil ) as soon as possible.   Will see Dr. Jeffrie on 09/18/24.

## 2024-08-12 NOTE — Telephone Encounter (Signed)
 Pt c/o medication issue:  1. Name of Medication: mexiletine (MEXITIL ) 200 MG capsule   2. How are you currently taking this medication (dosage and times per day)?    3. Are you having a reaction (difficulty breathing--STAT)? no  4. What is your medication issue? Wife is calling in about this medication. Wants the patient off of it. Advise it wasn't prescribe by our office but wife states it is a heart medication. Please advise

## 2024-08-12 NOTE — Telephone Encounter (Signed)
 Spoke with patient's wife Sharman (OK per [facility]) and shared message from Dr. Jeffrie with her.  OK to stop mexiletine.  I reviewed prior notes and Dr. Zenaida thought this would be reasonable to stop after his valve procedure.   Oneil Jeffrie, MD  Sharman verbalized understanding and expressed appreciation for call.

## 2024-08-13 ENCOUNTER — Ambulatory Visit

## 2024-08-13 NOTE — Telephone Encounter (Signed)
 Was pt able to be scheduled?

## 2024-08-16 ENCOUNTER — Other Ambulatory Visit (HOSPITAL_BASED_OUTPATIENT_CLINIC_OR_DEPARTMENT_OTHER): Payer: Self-pay | Admitting: Family Medicine

## 2024-08-16 DIAGNOSIS — E119 Type 2 diabetes mellitus without complications: Secondary | ICD-10-CM

## 2024-08-18 ENCOUNTER — Telehealth (HOSPITAL_COMMUNITY): Payer: Self-pay | Admitting: Cardiology

## 2024-08-18 ENCOUNTER — Encounter: Payer: Self-pay | Admitting: Physical Therapy

## 2024-08-18 ENCOUNTER — Ambulatory Visit

## 2024-08-18 VITALS — BP 130/89 | HR 96

## 2024-08-18 DIAGNOSIS — R42 Dizziness and giddiness: Secondary | ICD-10-CM

## 2024-08-18 DIAGNOSIS — R2681 Unsteadiness on feet: Secondary | ICD-10-CM

## 2024-08-18 NOTE — Therapy (Signed)
 OUTPATIENT PHYSICAL THERAPY VESTIBULAR TREATMENT/DISCHARGE SUMMARY     Patient Name: Kirk [name] MRN: [id number] DOB:01/07/1946, 78 y.o., male Today's Date: 08/18/2024  PHYSICAL THERAPY DISCHARGE SUMMARY  Visits from Start of Care: 2  Current functional level related to goals / functional outcomes: See Clinical Assessment Statement   Remaining deficits: N/A, no more dizziness    Education / Equipment: HEP   Patient agrees to discharge. Patient stopped taking mexiletine and now no longer having dizziness and is back to his functional baseline.    END OF SESSION:  PT End of Session - 08/18/24 1234     Visit Number 2    Number of Visits 9    Date for Recertification  09/05/24    Authorization Type BCBS Medicare    PT Start Time 1233    PT Stop Time 1256   full time not used due to D/C visit   PT Time Calculation (min) 23 min    Activity Tolerance Patient tolerated treatment well    Behavior During Therapy [facility] for tasks assessed/performed          Past Medical History:  Diagnosis Date   Allergy    Anal cancer (HCC) 04/2012   Aortic stenosis    valve replacement   Arthritis    1995- cerv. 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   CHF (congestive heart failure) (HCC)    Coronary artery disease    aortic stenosis, CAD   Diabetes mellitus    Type 2   DVT (deep venous thrombosis) (HCC)    ED (erectile dysfunction)    Elevated cholesterol    Heart murmur    no problems   History of kidney stones    passed, lithrotrispy and surgery   History of radiation therapy 05/20/12-06/28/12   anal cancer=54gy total dose   Hypertension    Hypothyroidism    Kidney stones    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   Malignant neoplasm of other sites of rectum, rectosigmoid junction, and anus 04/12/2012    Myocardial infarction (HCC) 2012   Personal history of colonic polyps 03/20/2012   Pneumonia    Pulmonary embolism (HCC)    Rectal bleeding    Rectal mass 03/20/2012   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   #60mm [facility] Ease pericardial tissue valve    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   LIMA to diagonal branch    Seizure disorder, grand mal (HCC)    only 1   Seizures (HCC)    after taking Oxycodone ; not prescribed seizure med   Shortness of breath    Thrombosed hemorrhoids    Past Surgical History:  Procedure Laterality Date   ANAL FISSURE REPAIR     patient unsure of date   ANTERIOR CERVICAL DECOMP/DISCECTOMY FUSION N/A 01/22/2018   Procedure: ANTERIOR CERVICAL DECOMPRESSION/DISCECTOMY FUSION CERVICAL SIX- CERVICAL SEVEN;  Surgeon: Unice Pac, MD;  Location: [facility] OR;  Service: Neurosurgery;  Laterality: N/A;  ANTERIOR CERVICAL DECOMPRESSION/DISCECTOMY FUSION CERVICAL SIX- CERVICAL SEVEN   AORTIC VALVE REPLACEMENT  08/11/2011   Procedure: AORTIC VALVE REPLACEMENT (AVR);  Surgeon: Sudie VEAR Laine, MD;  Location: [facility] OR;  Service: Open Heart Surgery;  Laterality: N/A;   C4-6 FUSION  1995   DR STERN   COLONOSCOPY     hx polyps  CORONARY ARTERY BYPASS GRAFT  08/16/2011   Procedure: CORONARY ARTERY BYPASS GRAFTING (CABG);  Surgeon: Sudie VEAR Laine, MD;  Location: [facility] OR;  Service: Open Heart Surgery;  Laterality: N/A;  CABG times one using left internal mammary artery   LITHOTRIPSY  2001 AND 2002   DR PETERSON   POLYPECTOMY     colon   RIGHT HEART CATH AND CORONARY/GRAFT ANGIOGRAPHY N/A 05/15/2024   Procedure: RIGHT HEART CATH AND CORONARY/GRAFT ANGIOGRAPHY;  Surgeon: Ladona Heinz, MD;  Location: [facility] INVASIVE CV LAB;  Service: Cardiovascular;  Laterality: N/A;   TOTAL KNEE ARTHROPLASTY Right 06/18/2023   Procedure: RIGHT TOTAL KNEE ARTHROPLASTY;  Surgeon: Melodi Lerner, MD;  Location: [facility] ORS;  Service: Orthopedics;  Laterality: Right;   TOTAL KNEE ARTHROPLASTY Left 11/05/2023    Procedure: ARTHROPLASTY, KNEE, TOTAL;  Surgeon: Melodi Lerner, MD;  Location: [facility] ORS;  Service: Orthopedics;  Laterality: Left;   TRANSESOPHAGEAL ECHOCARDIOGRAM (CATH LAB) N/A 05/15/2024   Procedure: TRANSESOPHAGEAL ECHOCARDIOGRAM;  Surgeon: Santo Stanly LABOR, MD;  Location: [facility] INVASIVE CV LAB;  Service: Cardiovascular;  Laterality: N/A;   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 2 diabetes mellitus (HCC) 02/04/2024   Overactive bladder 02/04/2024   Peripheral neuropathy 02/04/2024   Polyneuropathy due to type 2 diabetes mellitus (HCC) 02/04/2024   Presence of aortocoronary bypass graft 02/04/2024   Right knee pain 02/04/2024   Bilateral tinnitus 02/04/2024   Anemia 02/04/2024   Osteoarthritis of left knee 11/05/2023   Wellness examination 10/10/2023   Urinary frequency 10/10/2023   Cough 10/04/2023   Morton's toe of right foot 09/27/2023   Ankle pain 09/27/2023   Stiffness of right knee 06/29/2023   Muscle weakness 06/25/2023   Primary osteoarthritis of right knee 06/18/2023   Preoperative clearance 06/06/2023   Foot pain 03/21/2023   Neck pain 02/08/2023   Low testosterone  02/08/2023   Osteoarthritis of knee 11/29/2022   Right leg DVT (HCC) 04/26/2022   Chronic kidney disease, stage 3a (HCC) 04/26/2022   Essential hypertension 04/26/2022   Hypothyroidism 04/26/2022   AKI (acute kidney injury) 04/26/2022   Type 2 diabetes mellitus (HCC) 04/26/2022   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   Cervical myelopathy (HCC) 01/22/2018   Seizures (HCC) 10/13/2013   Obesity 08/11/2013   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   History of anal cancer 04/12/2012   Cancer (HCC) 04/09/2012   Rectal mass 03/20/2012   History of colonic polyps 03/20/2012   Atrial fibrillation (HCC) 08/20/2011   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 Stopped taking the mexiletine  (got the ok from his cardiologist) and now no longer needs the RW and has been exercising. Not having any dizziness and the wooziness is gone. Feels like new money.  Feels back to his normal self. Has not had any vertigo. Has neuropathy in his feet that can affect his balance. No longer Meclizine .   Pt accompanied by: significant other  PERTINENT HISTORY: anal cancer, aortic stenosis s/p TAVR, CHF, CAD, DM, DVT, HLD, HTN, MI, PE, CABG x1, seizure  PAIN:  Are you having pain? No Dizziness: 4/5  PRECAUTIONS: Fall   WEIGHT BEARING RESTRICTIONS: No  FALLS: Has patient fallen in last 6 months? Yes. Number of falls 1; rolled off the toilet; multiple near falls   LIVING ENVIRONMENT: Lives with: lives with their family Lives in: House/apartment Stairs: Yes: External: 3 steps; on right going up Has following equipment at  home: Single point cane, Walker - 2 wheeled, and shower chair  PLOF: Independent; driving when feel safe  PATIENT GOALS: help me get this out of my head  OBJECTIVE:  Note: Objective measures were completed at Evaluation unless otherwise noted.  DIAGNOSTIC FINDINGS: none relevant   COGNITION: Overall cognitive status: Within functional limits for tasks assessed   SENSATION: Neuropathy in B feet   POSTURE:  rounded shoulders, forward head, increased thoracic kyphosis, and posterior pelvic tilt  Cervical ROM:   C4-6 fused    STRENGTH: WFL   BED MOBILITY:  Independent, but denies room spinning dizziness   GAIT: Gait pattern: step through pattern and wide BOS   VESTIBULAR ASSESSMENT:  GENERAL OBSERVATION: NAD, intermittently using SPC   SYMPTOM BEHAVIOR:  Subjective history: see above  Non-Vestibular symptoms: changes in hearing, changes in vision, headaches, and nausea/vomiting birds chirping in R ear, bells ringing in L ear, blurred vision at a distance   Type of dizziness: Imbalance (Disequilibrium) and wooziness; like I'm on a playground swing   Frequency: daily  Duration: seconds to at most 1.5hours   Aggravating factors: Spontaneous, Induced by position change: supine to sit and sit to stand, and Induced by motion: occur when walking, turning body quickly, turning head quickly, sitting in a moving car, and activity in general  Relieving factors: rest  Progression of symptoms: worse  OCULOMOTOR EXAM:  Ocular Alignment: normal  Ocular ROM: No Limitations  Spontaneous Nystagmus: absent  Gaze-Induced Nystagmus: absent  Smooth Pursuits: saccades  Saccades: hypometric/undershoots  Convergence/Divergence: 8 cm   Cover-cross-cover test: Deviations: R eye slightly exophoric    VESTIBULAR - OCULAR REFLEX:   Slow VOR: Normal and Comment: limited by cervical guarding   VOR Cancellation: Comment: normal, but limited by cervical guarding  Head-Impulse Test: HIT  Right: positive HIT Left: negative B limited by cervical guarding   Dynamic Visual Acuity: TBA PRN   POSITIONAL TESTING: Right Dix-Hallpike: no nystagmus Left Dix-Hallpike: no nystagmus and patient reporting seeing ceiling move Right Roll Test: no nystagmus Left Roll Test: no nystagmus  MOTION SENSITIVITY:  Motion Sensitivity Quotient Intensity: 0 = none, 1 = Lightheaded, 2 = Mild, 3 = Moderate, 4 = Severe, 5 = Vomiting  Intensity  1. Sitting to supine 0  2. Supine to L side   3. Supine to R side   4. Supine to sitting 0  5. L Hallpike-Dix 1  6. Up from L  1  7. R Hallpike-Dix 0  8. Up from R  0  9. Sitting, head tipped to L knee 0  10. Head up from L knee 0  11. Sitting, head tipped to R knee 0  12. Head up from R knee 0  13. Sitting head turns x5   14.Sitting head nods x5   15. In stance, 180 turn to L    16. In stance, 180 turn to R     Vitals:   08/18/24 1246  BP: 130/89  Pulse: 96                                                                                                                              TREATMENT:  Therapeutic Activity:   Vitals:   08/18/24 1246  BP: 130/89  Pulse: 96    DHI: 0 (absolutely no handicap)  POSITIONAL TESTING: Right Dix-Hallpike: no nystagmus Left Dix-Hallpike: no nystagmus Right Roll Test: no nystagmus Left Roll Test: no nystagmus  Pt with no dizziness  Pt was going to cancel his appt today, but wanted to come in just to be sure. Pt no longer having dizziness and is back to his functional baseline and now does not need any AD (at eval came in with a RW). Discussed pt can be discharged from PT as pt no longer having dizziness, pt and pt's spouse in agreement with plan.   PATIENT EDUCATION: Education details: See above, D/C from PT  Person educated: Patient and Spouse Education method: Explanation Education comprehension: verbalized understanding  HOME EXERCISE PROGRAM: -brandt daroff   GOALS: Goals reviewed with  patient? Yes  SHORT TERM GOALS:= LTG based on PT POC length   LONG TERM GOALS: Target date: 09/05/24  Pt will be independent with final HEP for improved symptom report  Baseline: goal not needed Goal status: N/A  2.  Patient will demonstrate (-) positional testing to indicate resolution of BPPV  Baseline: to be retested when patient has not taken meclizine  Goal status: MET  3.  MCTSIB goal  Baseline: goal not needed Goal status: N/A  4.  DHI goal  Baseline: pt scored a 0, no handicap  Goal status: N/A   ASSESSMENT:  CLINICAL IMPRESSION: Since pt was last here for therapy he has stopped taking mexiletine (per recommendation from his cardiologist) and pt no longer having any dizziness and is back to his functional baseline. Pt no longer has to use a RW and ambulates in today with no AD. Pt negative for positional testing and scored a 0 on the DHI indicating no handicap in regards to dizziness. Will cancel remainder of appts as pt discharged from PT. Pt and pt's spouse in agreement with plan as PT is not needed at this time.    OBJECTIVE IMPAIRMENTS: Abnormal gait, decreased knowledge of condition, dizziness, and impaired vision/preception.   ACTIVITY LIMITATIONS: sleeping, bed mobility, reach over head, hygiene/grooming, locomotion level, and  caring for others  PARTICIPATION LIMITATIONS: meal prep, cleaning, interpersonal relationship, driving, shopping, community activity, and yard work  PERSONAL FACTORS: Age, Fitness, Past/current experiences, Time since onset of injury/illness/exacerbation, Transportation, and 3+ comorbidities: see above are also affecting patient's functional outcome.   REHAB POTENTIAL: Fair unknown etiology   CLINICAL DECISION MAKING: Unstable/unpredictable  EVALUATION COMPLEXITY: High   PLAN:  PT FREQUENCY: 2x/week  PT DURATION: 4 weeks  PLANNED INTERVENTIONS: 02835- PT Re-evaluation, 97750- Physical Performance Testing, 97110-Therapeutic  exercises, 97530- Therapeutic activity, [id number]- Neuromuscular re-education, 97535- Self Care, 02859- Manual therapy, [id number]- Gait training, [phone]- Canalith repositioning, [id number]- Aquatic Therapy, [phone] (1-2 muscles), [phone] (3+ muscles)- Dry Needling, Patient/Family education, Balance training, Stair training, Vestibular training, Visual/preceptual remediation/compensation, Cognitive remediation, and DME instructions  PLAN FOR NEXT SESSION: D/C   Sheffield Senate, PT, DPT 08/18/2024 1:05 PM

## 2024-08-18 NOTE — Telephone Encounter (Signed)
 Called to confirm/remind patient of their appointment at the [facility] on 08/18/2024.   Appointment:   [] Confirmed  [x] Left mess   [] No answer/No voice mail  [] VM Full/unable to leave message  [] Phone not in service  Patient reminded to bring all medications and/or complete list.  Confirmed patient has transportation. Gave directions, instructed to utilize valet parking.

## 2024-08-19 ENCOUNTER — Ambulatory Visit (HOSPITAL_COMMUNITY)
Admission: RE | Admit: 2024-08-19 | Discharge: 2024-08-19 | Disposition: A | Source: Ambulatory Visit | Attending: Cardiology | Admitting: Cardiology

## 2024-08-19 VITALS — BP 120/85 | HR 103 | Wt 196.0 lb

## 2024-08-19 DIAGNOSIS — Z952 Presence of prosthetic heart valve: Secondary | ICD-10-CM | POA: Insufficient documentation

## 2024-08-19 DIAGNOSIS — Z955 Presence of coronary angioplasty implant and graft: Secondary | ICD-10-CM | POA: Insufficient documentation

## 2024-08-19 DIAGNOSIS — Z951 Presence of aortocoronary bypass graft: Secondary | ICD-10-CM

## 2024-08-19 DIAGNOSIS — I2510 Atherosclerotic heart disease of native coronary artery without angina pectoris: Secondary | ICD-10-CM | POA: Insufficient documentation

## 2024-08-19 DIAGNOSIS — I472 Ventricular tachycardia, unspecified: Secondary | ICD-10-CM | POA: Diagnosis not present

## 2024-08-19 DIAGNOSIS — T82857A Stenosis of cardiac prosthetic devices, implants and grafts, initial encounter: Secondary | ICD-10-CM | POA: Diagnosis not present

## 2024-08-19 DIAGNOSIS — I480 Paroxysmal atrial fibrillation: Secondary | ICD-10-CM | POA: Diagnosis not present

## 2024-08-19 DIAGNOSIS — I340 Nonrheumatic mitral (valve) insufficiency: Secondary | ICD-10-CM | POA: Insufficient documentation

## 2024-08-19 DIAGNOSIS — I2722 Pulmonary hypertension due to left heart disease: Secondary | ICD-10-CM | POA: Insufficient documentation

## 2024-08-19 DIAGNOSIS — I350 Nonrheumatic aortic (valve) stenosis: Secondary | ICD-10-CM | POA: Diagnosis present

## 2024-08-19 DIAGNOSIS — I5032 Chronic diastolic (congestive) heart failure: Secondary | ICD-10-CM | POA: Diagnosis not present

## 2024-08-19 MED ORDER — FUROSEMIDE 20 MG PO TABS: 20.0000 mg | ORAL_TABLET | ORAL | 3 refills | Status: AC | PRN

## 2024-08-19 NOTE — Patient Instructions (Addendum)
 STOP Asprin   STOP Mexitil   Your physician recommends that you schedule a follow-up appointment in: as needed  If you have any questions or concerns before your next appointment please send us  a message through [place] or call our office at [phone].    TO LEAVE A MESSAGE FOR THE NURSE SELECT OPTION 2, PLEASE LEAVE A MESSAGE INCLUDING: YOUR NAME DATE OF BIRTH CALL BACK NUMBER REASON FOR CALL**this is important as we prioritize the call backs  YOU WILL RECEIVE A CALL BACK THE SAME DAY AS LONG AS YOU CALL BEFORE 4:00 PM  At the [facility], you and your health needs are our priority. As part of our continuing mission to provide you with exceptional heart care, we have created designated Provider Care Teams. These Care Teams include your primary Cardiologist (physician) and Advanced Practice Providers (APPs- Physician Assistants and Nurse Practitioners) who all work together to provide you with the care you need, when you need it.   You may see any of the following providers on your designated Care Team at your next follow up: Dr Toribio Fuel Dr Ezra Shuck Dr. Morene Brownie [name], NP Caffie Shed, [place] [facility] [place], [place] Beckey Coe, NP Jordan Lee, NP Ellouise Class, NP Tinnie Redman, PharmD Jaun Bash, PharmD   Please be sure to bring in all your medications bottles to every appointment.    Thank you for choosing [facility]

## 2024-08-19 NOTE — Progress Notes (Incomplete)
° °  ADVANCED HEART FAILURE FOLLOW UP CLINIC NOTE  Referring Physician: de Cuba, Raymond J, MD  Primary Care: de Cuba, [name], MD Primary Cardiologist:  HPI: Kirk [name] is a 78 y.o. male who presents for follow up of symptomatic aortic stenosis.      Patient underwent surgical AVR in 2012 with LIMA-D1 and 25mm [facility] ease valve replacement. Did fairly well, increase in gradient on his last echo in 09/2023 but overall fairly well preserved. Presented in 05/2024 with acute hypoxic respiratory failure, became hypotensive with IV diuresis and then underwent TEE showing severe bioprosthetic AS with mean gradient 53.5, Vmax 4.79 and AVA 0.69. Subsequent right heart catheterization with severely elevated filling pressures and reduced cardiac index by assumed Fick.   Diuresed successfully. Discharged in stable condition.      SUBJECTIVE:  Patient overall reports that he is doing well.  He is accompanied by his wife today and is anxious to get his valve replaced.  He has been doing well with his current medical therapy, denies any significant palpitations or lightheadedness.  Has been ambulating around the house but no significant exertion.  Denies any recent volume overload symptoms.  PMH, current medications, allergies, social history, and family history reviewed in epic.  PHYSICAL EXAM: There were no vitals filed for this visit.  GENERAL: NAD, well appearing PULM:  Normal work of breathing, CTAB CARDIAC:  JVP: flat         Normal rate with regular rhythm. Systolic murmur at the RUSB.  No edema. Warm and well perfused extremities. ABDOMEN: Soft, non-tender, non-distended. NEUROLOGIC: Patient is oriented x3 with no focal or lateralizing neurologic deficits.     DATA REVIEW  ECHO: 05/15/2024: Severe MAC, replaced aortic valve with severe aortic stenosis, normal LVEF  CATH: 05/15/2024: RA 18, PA 68/30 (43), PCWP 31, Fick CO/CI 3.43/1.57.   ASSESSMENT & PLAN:  Severe aortic  valve stenosis: Previous surgical valve in 2012. Based on CTA will need coronary leaflet modification prior to procedure. Well compensated currently.  - Follow up in [facility] clinic for leaflet modification and TAVR, appointment 9/29   Chronic diastolic HF Pulmonary hypertension, WHO Group II In the setting of severe valvular disease, significant improvement with diuresis.  - Continue lasix  20mg  daily - Continue jardiance  10mg  daily - Difficulty with MRA during hospitalization, can reassess need after valve procedure   Mitral regurgitation:  - Moderate to severe, given MAC is a poor clip candidate - Suspect will improve after management of aortic valve disease   NSVT:  - Noted during previous hospital stay, multiple runs - Started on mexiletine, suspect he can stop after valve procedure  CAD - Mild disease, nonobstructive, prior LIMA-D1   PE:  - Prior history, continue eliqius 5mg  BID  Follow up in 2-3 months  Morene Brownie, MD Advanced Heart Failure Mechanical Circulatory Support 08/19/2024

## 2024-08-20 ENCOUNTER — Ambulatory Visit

## 2024-08-21 ENCOUNTER — Ambulatory Visit: Admitting: Physical Therapy

## 2024-08-25 ENCOUNTER — Ambulatory Visit: Admitting: Physical Therapy

## 2024-08-27 ENCOUNTER — Ambulatory Visit: Admitting: Physical Therapy

## 2024-09-01 ENCOUNTER — Ambulatory Visit: Admitting: Physical Therapy

## 2024-09-01 ENCOUNTER — Other Ambulatory Visit (HOSPITAL_BASED_OUTPATIENT_CLINIC_OR_DEPARTMENT_OTHER): Payer: Self-pay | Admitting: Family Medicine

## 2024-09-01 ENCOUNTER — Other Ambulatory Visit (HOSPITAL_BASED_OUTPATIENT_CLINIC_OR_DEPARTMENT_OTHER): Payer: Self-pay

## 2024-09-01 ENCOUNTER — Other Ambulatory Visit: Payer: Self-pay | Admitting: Oncology

## 2024-09-01 DIAGNOSIS — E1142 Type 2 diabetes mellitus with diabetic polyneuropathy: Secondary | ICD-10-CM

## 2024-09-01 DIAGNOSIS — I824Z1 Acute embolism and thrombosis of unspecified deep veins of right distal lower extremity: Secondary | ICD-10-CM

## 2024-09-03 ENCOUNTER — Ambulatory Visit

## 2024-09-03 ENCOUNTER — Telehealth: Payer: Self-pay

## 2024-09-03 NOTE — Telephone Encounter (Unsigned)
 Copied from CRM #[id number]. Topic: General - Other >> Sep 03, 2024  4:18 PM Delon DASEN wrote: Reason for CRM: wife returned message, read message verbatim, no questions

## 2024-09-03 NOTE — Telephone Encounter (Signed)
 Copied from CRM #[id number]. Topic: Clinical - Medical Advice >> Sep 03, 2024 10:40 AM Montie POUR wrote: Reason for CRM:  Kirk [name] had a TAVR on 06/26/24 and he quick taking his Papav-Phentolamine-Alprostadil 150-10-100 MG-MG-MCG SOLR. Kirk [name], spouse, wants to know if it is okay to start the injections back. Please call her at [phone]. Thanks

## 2024-09-03 NOTE — Telephone Encounter (Signed)
 Per Dr. De Cuba, 09/03/24  3:46 PM Should be okay to resume medication, however I would also recommend for them to discuss with provider who performed TAVR to ensure that they do not have any concerns with patient resuming medication.

## 2024-09-05 ENCOUNTER — Other Ambulatory Visit (HOSPITAL_BASED_OUTPATIENT_CLINIC_OR_DEPARTMENT_OTHER): Payer: Self-pay

## 2024-09-05 ENCOUNTER — Other Ambulatory Visit (HOSPITAL_BASED_OUTPATIENT_CLINIC_OR_DEPARTMENT_OTHER): Payer: Self-pay | Admitting: Family Medicine

## 2024-09-05 DIAGNOSIS — R7989 Other specified abnormal findings of blood chemistry: Secondary | ICD-10-CM

## 2024-09-08 ENCOUNTER — Ambulatory Visit: Admitting: Physical Therapy

## 2024-09-10 ENCOUNTER — Ambulatory Visit: Admitting: Physical Therapy

## 2024-09-10 ENCOUNTER — Telehealth: Payer: Self-pay

## 2024-09-10 ENCOUNTER — Other Ambulatory Visit (HOSPITAL_COMMUNITY): Payer: Self-pay

## 2024-09-10 ENCOUNTER — Telehealth: Payer: Self-pay | Admitting: *Deleted

## 2024-09-10 NOTE — Telephone Encounter (Signed)
 Pharmacy Patient Advocate Encounter   Received notification from Physician's Office that prior authorization for Testosterone  Cypionate 100MG /ML intramuscular solution is required/requested.   Insurance verification completed.   The patient is insured through [facility].   Per test claim: PA required; PA started via CoverMyMeds. KEY [id number] . Waiting for clinical questions to populate.

## 2024-09-10 NOTE — Telephone Encounter (Signed)
 Copied from CRM #[id number]. Topic: Clinical - Medication Prior Auth >> Sep 10, 2024 11:36 AM Sophia H wrote: Reason for CRM: Patients wife called to inform that testosterone  cypionate (DEPOTESTOTERONE CYPIONATE) 100 MG/ML injection requires a prior authorization. Please submit and advise once done.

## 2024-09-18 ENCOUNTER — Ambulatory Visit: Admitting: Cardiology

## 2024-09-18 VITALS — BP 116/60 | HR 98 | Ht 72.0 in | Wt 201.0 lb

## 2024-09-18 DIAGNOSIS — I350 Nonrheumatic aortic (valve) stenosis: Secondary | ICD-10-CM

## 2024-09-18 DIAGNOSIS — I480 Paroxysmal atrial fibrillation: Secondary | ICD-10-CM

## 2024-09-18 DIAGNOSIS — Z951 Presence of aortocoronary bypass graft: Secondary | ICD-10-CM | POA: Diagnosis not present

## 2024-09-18 NOTE — Patient Instructions (Signed)
 Medication Instructions:  The current medical regimen is effective;  continue present plan and medications.  *If you need a refill on your cardiac medications before your next appointment, please call your pharmacy*  Follow-Up: At [facility], you and your health needs are our priority.  As part of our continuing mission to provide you with exceptional heart care, our providers are all part of one team.  This team includes your primary Cardiologist (physician) and Advanced Practice Providers or APPs (Physician Assistants and Nurse Practitioners) who all work together to provide you with the care you need, when you need it.  Your next appointment:   6 month(s)  Provider:   Dorothye Gathers, MD    We recommend signing up for the patient portal called "MyChart".  Sign up information is provided on this After Visit Summary.  MyChart is used to connect with patients for Virtual Visits (Telemedicine).  Patients are able to view lab/test results, encounter notes, upcoming appointments, etc.  Non-urgent messages can be sent to your provider as well.   To learn more about what you can do with MyChart, go to [email or web address].

## 2024-09-18 NOTE — Progress Notes (Signed)
 " Cardiology Office Note:  .   Date:  09/18/2024  ID:  Kirk [name], DOB [birth year], MRN [id number] PCP: de [place], Raymond J, MD  [facility] Providers Cardiologist:  Oneil Parchment, MD     History of Present Illness: .   Kirk [name] is a 79 y.o. male Discussed the use of AI scribe  History of Present Illness Kirk [name] is a 79 year old male with severe valvular heart disease and pulmonary hypertension who presents for follow-up after valve replacement and leaflet modification.  He has a history of severe valvular heart disease, having undergone a transcatheter aortic valve replacement (TAVR) after surgical valve replacement in 2012 and a leaflet modification at the [facility] in [place] . He is currently monitored by the advanced heart failure team. 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  He is taking Lasix  20 mg as needed and Farxiga  10 mg daily. His mitral regurgitation has been moderate to severe with mitral annular calcification. He is a poor candidate for the CLIP procedure.  He experienced nonsustained ventricular tachycardia during a previous hospital stay, with multiple runs noted. He was started on mexiletine, which has since been stopped as he has not had further events.  He has non-obstructive coronary artery disease, with a prior left internal mammary artery (LIMA) to diagonal one during his 2012 valve replacement. He also has a history of pulmonary embolism and continues on Eliquis  5 mg twice daily and Plavix  75 mg for recent valve anatomy change.  An echocardiogram on May 15, 2024, showed severe mitral annular calcification, a replaced aortic valve with severe aortic stenosis, and a normal left ventricular ejection fraction. His Vmax was 4.8 with a mean gradient of 54. He had complications with the native or replaced aortic valve leaflet,  requiring RCA PCI, and is on Plavix  for the PCI.  He has frequent urination, which was previously evaluated and thought to be due to radiation effects from years ago. A Botox injection into the bladder was considered, but this was postponed. He has a return appointment in May to reassess this issue.   Enjoys [employer]    Studies Reviewed: .        Results Diagnostic Transthoracic echocardiogram (05/15/2024): Severe mitral annular calcification, bioprosthetic aortic valve with severe aortic stenosis, normal left ventricular ejection fraction, Vmax 4.8, mean gradient 54 mmHg Risk Assessment/Calculations:            Physical Exam:   VS:  BP 116/60 (BP Location: Right Arm, Patient Position: Sitting, Cuff Size: Normal)   Pulse 98   Ht 6' (1.829 m)   Wt 201 lb (91.2 kg)   SpO2 94%   BMI 27.26 kg/m    Wt Readings from Last 3 Encounters:  09/18/24 201 lb (91.2 kg)  08/19/24 196 lb (88.9 kg)  07/21/24 199 lb 12.8 oz (90.6 kg)    GEN: Well nourished, well developed in no acute distress NECK: No JVD; No carotid bruits CARDIAC: RRR, 1/6 SM, no rubs, no gallops RESPIRATORY:  Clear to auscultation without rales, wheezing or rhonchi  ABDOMEN: Soft, non-tender, non-distended EXTREMITIES:  No edema; No deformity   ASSESSMENT AND PLAN: .    Assessment and Plan Assessment & Plan Aortic valve stenosis, status post TAVR and surgical replacement with leaflet modification Status post TAVR and surgical valve replacement with leaflet modification. Well-managed post-procedure with no significant issues.  Vmax was 4.8, mean gradient of 54. Leaflet modification had complications with the native or replaced aortic valve leaflet, requiring RCA PCI. - Continue current management and monitoring of valve function.  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  Currently euvolemic and well compensated post valve replacement. - Continue current diuretic regimen as needed.  Mitral regurgitation with mitral annular calcification Moderate to severe mitral regurgitation with mitral annular calcification. Poor CLIP candidate. Suspected improvement after management of aortic valve disease. - Continue monitoring mitral regurgitation and assess for improvement post aortic valve management.  Coronary artery disease, status post LIMA to diagonal graft and RCA PCI Coronary artery disease with prior LIMA to diagonal graft during 2012 valve replacement. Recent RCA PCI due to complications during leaflet modification. On Plavix  for PCI. - Continue Plavix  for PCI.  History of ventricular tachycardia, resolved off mexiletine Nonsustained ventricular tachycardia noted during previous hospital stay, multiple runs. Mexiletine was started but has been stopped as no further events have occurred.  History of pulmonary embolism, on anticoagulation Pulmonary embolism with ongoing anticoagulation therapy. On Eliquis  5 mg twice daily. - Continue Eliquis  5 mg twice daily.         Dispo: 6 mths  Signed, Oneil Parchment, MD  "

## 2024-09-19 ENCOUNTER — Other Ambulatory Visit (HOSPITAL_BASED_OUTPATIENT_CLINIC_OR_DEPARTMENT_OTHER): Payer: Self-pay | Admitting: Family Medicine

## 2024-09-22 ENCOUNTER — Other Ambulatory Visit (HOSPITAL_COMMUNITY): Payer: Self-pay

## 2024-09-22 NOTE — Telephone Encounter (Signed)
 Pharmacy Patient Advocate Encounter  Received notification from [facility] that Prior Authorization for Testosterone  Cypionate 100MG /ML intramuscular solution has been DENIED.  Full denial letter will be uploaded to the media tab. See denial reason below.  Denied. We denied coverage for this drug because Testosterone  Cypionate did not meet Androgens Injectable Prior Authorization Criteria requirements. Prior Authorization requires members to meet certain criteria set by the plan before a drug is covered. Testosterone  Cypionate is approved when the member has no FDA labeled contraindications to therapy. In this case, the provider stated that the member has serious cardiac, hepatic, or renal disease, which is an FDA labeled contraindication to use Testosterone  Cypionate.    PA #/Case ID/Reference #: [id number]

## 2024-09-24 ENCOUNTER — Other Ambulatory Visit (HOSPITAL_COMMUNITY): Payer: Self-pay

## 2024-09-24 ENCOUNTER — Telehealth (HOSPITAL_BASED_OUTPATIENT_CLINIC_OR_DEPARTMENT_OTHER): Payer: Self-pay | Admitting: Family Medicine

## 2024-09-24 NOTE — Telephone Encounter (Signed)
 Copied from CRM #[id number]. Topic: Clinical - Medication Prior Auth >> Sep 24, 2024  8:56 AM Kirk [name] wrote: Kirk [name] is appealing denied authorization for above medication. Nikki with [employer] has a question about what diagnostic codes he has. Kirk [name] needs a call back by 12:00 PM today or the appeal will be closed out. Her number is [phone]. I tried to transfer to clinic and all nurses were busy.

## 2024-09-25 ENCOUNTER — Inpatient Hospital Stay: Attending: Oncology | Admitting: Oncology

## 2024-09-25 VITALS — BP 142/78 | HR 91 | Temp 97.8°F | Resp 18 | Ht 72.0 in | Wt 202.3 lb

## 2024-09-25 DIAGNOSIS — Z85048 Personal history of other malignant neoplasm of rectum, rectosigmoid junction, and anus: Secondary | ICD-10-CM | POA: Insufficient documentation

## 2024-09-25 DIAGNOSIS — Z86711 Personal history of pulmonary embolism: Secondary | ICD-10-CM | POA: Insufficient documentation

## 2024-09-25 DIAGNOSIS — Z86718 Personal history of other venous thrombosis and embolism: Secondary | ICD-10-CM | POA: Insufficient documentation

## 2024-09-25 DIAGNOSIS — C211 Malignant neoplasm of anal canal: Secondary | ICD-10-CM

## 2024-09-25 DIAGNOSIS — Z7902 Long term (current) use of antithrombotics/antiplatelets: Secondary | ICD-10-CM | POA: Insufficient documentation

## 2024-09-25 NOTE — Progress Notes (Signed)
 " [facility] OFFICE PROGRESS NOTE   Diagnosis: Anal cancer  INTERVAL HISTORY:   Kirk [name] returns for a scheduled visit.  He feels well.  No anal pain.  No bleeding.  He developed respiratory failure in September 2025 and was diagnosed with severe prosthetic aortic stenosis.  He underwent valve surgery in [place] on 06/26/2024.  He is maintained on Plavix  in addition to apixaban .  He reports feeling better following the valve surgery.  Objective:  Vital signs in last 24 hours:  Blood pressure (!) 142/78, pulse 91, temperature 97.8 F (36.6 C), temperature source Temporal, resp. rate 18, height 6' (1.829 m), weight 202 lb 4.8 oz (91.8 kg), SpO2 98%.  Lymphatics: No cervical, supraclavicular, axillary, or inguinal nodes Resp: End inspiratory rhonchi at the left posterior base, no respiratory distress Cardio: Regular rhythm, 2/6 murmur GI: No hepatosplenomegaly Vascular: No leg edema  Skin: No mass at the anal margin or anal verge   Lab Results:  Lab Results  Component Value Date   WBC 8.9 07/21/2024   HGB 15.5 07/21/2024   HCT 46.9 07/21/2024   MCV 93 07/21/2024   PLT 212 07/21/2024   NEUTROABS 7.4 (H) 07/21/2024    CMP  Lab Results  Component Value Date   NA 138 07/21/2024   K 5.4 (H) 07/21/2024   CL 99 07/21/2024   CO2 23 07/21/2024   GLUCOSE 114 (H) 07/21/2024   BUN 16 07/21/2024   CREATININE 1.29 (H) 07/21/2024   CALCIUM  10.2 07/21/2024   PROT 7.0 05/14/2024   ALBUMIN  3.4 (L) 05/20/2024   AST 35 05/14/2024   ALT 25 05/14/2024   ALKPHOS 61 05/14/2024   BILITOT 1.2 05/14/2024   GFRNONAA >60 05/20/2024   GFRAA >60 01/08/2018    Medications: I have reviewed the patient's current medications.   Assessment/Plan:  Squamous cell carcinoma of the anal canal, HPV positive.   - Staging CT/PET scan with tiny lung nodules, hypermetabolic retroperitoneal, right inguinal, and left supraclavicular nodes . FNA biopsy of a left supraclavicular lymph  node on 05/03/2012 confirmed metastatic squamous cell carcinoma.   -Cycle 1 of 5-fluorouracil /mitomycin -C and concurrent radiation on 05/20/2012. Cycle 2 of 5-fluorouracil /mitomycin -C on 06/24/2012 with completion of radiation on 06/28/2012.   -Restaging PET scan 08/02/2012 with resolution of the hypermetabolic anal primary and right inguinal lymph node. Persistent hypermetabolic supraclavicular and retroperitoneal nodes.   -cycle 1 of salvage Taxol /carboplatin  chemotherapy on 08/30/2012 . Restaging CTs after 4 cycles of Taxol /carboplatin  confirmed resolution of retroperitoneal lymphadenopathy and a decrease in the size of a previously noted left supraclavicular node. He completed cycle 6 of Taxol /carboplatin  on 01/02/2013.   -A negative flexible sigmoidoscopy 04/01/2013   -Staging CT scans 10/09/2013 with stable small left supraclavicular node, no evidence of progressive metastatic disease   -Negative colonoscopy 03/19/2015 -Colonoscopy 05/02/2018-no evidence of recurrent anal cancer, multiple tubular adenomas removed from the colon -Colonoscopy 06/30/2019-no evidence of recurrent anal cancer, multiple tubular adenomas removed -Colonoscopy 01/01/2023-polyps removed-no adenomatous 3. Hypermetabolic activity at the right supraglottic larynx on the PET scan 04/17/2012 with associated soft tissue fullness status post a negative ENT evaluation by Dr. Jesus.   4. History of aortic stenosis-status post aortic valve replacement and coronary artery bypass surgery in November 2012.   06/26/2024 TAVR with with a stent to the ostial RCA 5. Mucositis following cycle 1 chemotherapy. Resolved.   6. Skin breakdown at the perineum and penis secondary to chemotherapy and radiation. Resolved.   7. 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 Bone pain after Neulasta    9. Generalized seizure 09/24/2012-negative brain CT,? Etiology. He has been evaluated by neurology and was prescribed Vimpat .   10.history of  Thrombocytopenia secondary chemotherapy  11. Peripheral neuropathy-predating chemotherapy   12. Erectile dysfunction-followed by Dr. Alline 13.  Right leg DVT 03/28/2022  CT chest 04/25/2022-nonocclusive filling defect of the right common femoral vein, acute saddle pulmonary embolus of the main pulmonary artery, left and right pulmonary arteries, and bilateral lateral lobar arteries Admission 04/26/2022-heparin  anticoagulation, discharged on apixaban        Disposition: Kirk [name] remains in clinical remission from anal cancer.  He will continue colonoscopy surveillance with Dr. Leigh.  Kirk [name] continues apixaban  anticoagulation after being diagnosed with a right lower extremity DVT and pulmonary embolism in 2023.  He is maintained on antiplatelet therapy with her cardiology service.  He will return for an office visit in 9 months.  Arley Hof, MD  09/25/2024  9:39 AM   "

## 2024-09-26 ENCOUNTER — Ambulatory Visit: Admitting: Oncology

## 2024-10-13 ENCOUNTER — Ambulatory Visit (HOSPITAL_BASED_OUTPATIENT_CLINIC_OR_DEPARTMENT_OTHER): Admitting: Family Medicine

## 2024-10-15 ENCOUNTER — Ambulatory Visit (HOSPITAL_COMMUNITY)

## 2025-06-24 ENCOUNTER — Inpatient Hospital Stay: Admitting: Oncology
